# Patient Record
Sex: Female | Born: 1950
Health system: Southern US, Community
[De-identification: ages and names within clinical notes are randomized; demographics above are authoritative.]

## PROBLEM LIST (undated history)

## (undated) DIAGNOSIS — J209 Acute bronchitis, unspecified: Secondary | ICD-10-CM

## (undated) DIAGNOSIS — F419 Anxiety disorder, unspecified: Secondary | ICD-10-CM

## (undated) DIAGNOSIS — E785 Hyperlipidemia, unspecified: Secondary | ICD-10-CM

## (undated) DIAGNOSIS — F17200 Nicotine dependence, unspecified, uncomplicated: Secondary | ICD-10-CM

## (undated) DIAGNOSIS — K5900 Constipation, unspecified: Secondary | ICD-10-CM

## (undated) DIAGNOSIS — E119 Type 2 diabetes mellitus without complications: Secondary | ICD-10-CM

## (undated) DIAGNOSIS — I639 Cerebral infarction, unspecified: Secondary | ICD-10-CM

## (undated) DIAGNOSIS — G709 Myoneural disorder, unspecified: Secondary | ICD-10-CM

## (undated) DIAGNOSIS — K219 Gastro-esophageal reflux disease without esophagitis: Secondary | ICD-10-CM

## (undated) DIAGNOSIS — G894 Chronic pain syndrome: Secondary | ICD-10-CM

## (undated) DIAGNOSIS — G8929 Other chronic pain: Secondary | ICD-10-CM

## (undated) DIAGNOSIS — J45909 Unspecified asthma, uncomplicated: Secondary | ICD-10-CM

## (undated) DIAGNOSIS — I10 Essential (primary) hypertension: Secondary | ICD-10-CM

## (undated) DIAGNOSIS — F331 Major depressive disorder, recurrent, moderate: Secondary | ICD-10-CM

## (undated) DIAGNOSIS — M1990 Unspecified osteoarthritis, unspecified site: Secondary | ICD-10-CM

## (undated) DIAGNOSIS — T7840XA Allergy, unspecified, initial encounter: Secondary | ICD-10-CM

## (undated) DIAGNOSIS — R519 Headache, unspecified: Secondary | ICD-10-CM

## (undated) DIAGNOSIS — M79606 Pain in leg, unspecified: Secondary | ICD-10-CM

## (undated) DIAGNOSIS — F411 Generalized anxiety disorder: Secondary | ICD-10-CM

## (undated) DIAGNOSIS — R413 Other amnesia: Secondary | ICD-10-CM

## (undated) DIAGNOSIS — M79603 Pain in arm, unspecified: Secondary | ICD-10-CM

## (undated) DIAGNOSIS — R269 Unspecified abnormalities of gait and mobility: Secondary | ICD-10-CM

## (undated) DIAGNOSIS — Z959 Presence of cardiac and vascular implant and graft, unspecified: Secondary | ICD-10-CM

## (undated) DIAGNOSIS — R471 Dysarthria and anarthria: Secondary | ICD-10-CM

## (undated) DIAGNOSIS — R5383 Other fatigue: Secondary | ICD-10-CM

## (undated) DIAGNOSIS — E0590 Thyrotoxicosis, unspecified without thyrotoxic crisis or storm: Secondary | ICD-10-CM

## (undated) DIAGNOSIS — Z9181 History of falling: Secondary | ICD-10-CM

## (undated) DIAGNOSIS — R51 Headache: Secondary | ICD-10-CM

## (undated) HISTORY — DX: Hyperlipidemia, unspecified: E78.5

## (undated) HISTORY — DX: Major depressive disorder, recurrent, moderate: F33.1

## (undated) HISTORY — DX: Gastro-esophageal reflux disease without esophagitis: K21.9

## (undated) HISTORY — DX: Nicotine dependence, unspecified, uncomplicated: F17.200

## (undated) HISTORY — DX: Constipation, unspecified: K59.00

## (undated) HISTORY — DX: Essential (primary) hypertension: I10

## (undated) HISTORY — DX: Other amnesia: R41.3

## (undated) HISTORY — DX: Unspecified osteoarthritis, unspecified site: M19.90

## (undated) HISTORY — DX: Acute bronchitis, unspecified: J20.9

## (undated) HISTORY — DX: Unspecified abnormalities of gait and mobility: R26.9

## (undated) HISTORY — DX: Thyrotoxicosis, unspecified without thyrotoxic crisis or storm: E05.90

## (undated) HISTORY — DX: History of falling: Z91.81

## (undated) HISTORY — DX: Presence of cardiac and vascular implant and graft, unspecified: Z95.9

## (undated) HISTORY — DX: Myoneural disorder, unspecified: G70.9

## (undated) HISTORY — DX: Unspecified asthma, uncomplicated: J45.909

## (undated) HISTORY — DX: Generalized anxiety disorder: F41.1

## (undated) HISTORY — DX: Chronic pain syndrome: G89.4

## (undated) HISTORY — DX: Cerebral infarction, unspecified: I63.9

## (undated) HISTORY — DX: Anxiety disorder, unspecified: F41.9

## (undated) HISTORY — DX: Type 2 diabetes mellitus without complications: E11.9

## (undated) HISTORY — DX: Allergy, unspecified, initial encounter: T78.40XA

## (undated) HISTORY — PX: FINGER SURGERY: SHX640

## (undated) HISTORY — DX: Dysarthria and anarthria: R47.1

---

## 1998-06-05 ENCOUNTER — Encounter: Payer: Self-pay | Admitting: Emergency Medicine

## 1998-06-05 ENCOUNTER — Emergency Department (HOSPITAL_COMMUNITY): Admission: EM | Admit: 1998-06-05 | Discharge: 1998-06-05 | Payer: Self-pay | Admitting: Emergency Medicine

## 2000-09-17 ENCOUNTER — Emergency Department (HOSPITAL_COMMUNITY): Admission: EM | Admit: 2000-09-17 | Discharge: 2000-09-17 | Payer: Self-pay | Admitting: Internal Medicine

## 2002-05-07 ENCOUNTER — Emergency Department (HOSPITAL_COMMUNITY): Admission: EM | Admit: 2002-05-07 | Discharge: 2002-05-07 | Payer: Self-pay | Admitting: Emergency Medicine

## 2002-05-07 ENCOUNTER — Encounter: Payer: Self-pay | Admitting: Emergency Medicine

## 2004-01-05 ENCOUNTER — Emergency Department (HOSPITAL_COMMUNITY): Admission: EM | Admit: 2004-01-05 | Discharge: 2004-01-05 | Payer: Self-pay | Admitting: Emergency Medicine

## 2004-01-07 ENCOUNTER — Inpatient Hospital Stay (HOSPITAL_COMMUNITY): Admission: EM | Admit: 2004-01-07 | Discharge: 2004-01-09 | Payer: Self-pay | Admitting: Emergency Medicine

## 2005-11-15 ENCOUNTER — Emergency Department (HOSPITAL_COMMUNITY): Admission: EM | Admit: 2005-11-15 | Discharge: 2005-11-15 | Payer: Self-pay | Admitting: Family Medicine

## 2006-02-05 DIAGNOSIS — G8929 Other chronic pain: Secondary | ICD-10-CM

## 2006-02-05 HISTORY — DX: Other chronic pain: G89.29

## 2006-05-17 ENCOUNTER — Encounter: Payer: Self-pay | Admitting: Emergency Medicine

## 2006-05-17 ENCOUNTER — Inpatient Hospital Stay (HOSPITAL_COMMUNITY): Admission: AD | Admit: 2006-05-17 | Discharge: 2006-05-21 | Payer: Self-pay | Admitting: Neurology

## 2006-05-20 ENCOUNTER — Encounter (INDEPENDENT_AMBULATORY_CARE_PROVIDER_SITE_OTHER): Payer: Self-pay | Admitting: Cardiology

## 2006-05-20 ENCOUNTER — Encounter: Payer: Self-pay | Admitting: Cardiology

## 2006-06-03 ENCOUNTER — Encounter: Admission: RE | Admit: 2006-06-03 | Discharge: 2006-06-27 | Payer: Self-pay | Admitting: Neurology

## 2006-07-02 ENCOUNTER — Encounter: Admission: RE | Admit: 2006-07-02 | Discharge: 2006-08-01 | Payer: Self-pay | Admitting: Nurse Practitioner

## 2006-08-29 ENCOUNTER — Encounter: Admission: RE | Admit: 2006-08-29 | Discharge: 2006-11-27 | Payer: Self-pay | Admitting: Internal Medicine

## 2006-10-10 ENCOUNTER — Observation Stay (HOSPITAL_COMMUNITY): Admission: EM | Admit: 2006-10-10 | Discharge: 2006-10-11 | Payer: Self-pay | Admitting: Emergency Medicine

## 2008-02-03 ENCOUNTER — Emergency Department (HOSPITAL_COMMUNITY): Admission: EM | Admit: 2008-02-03 | Discharge: 2008-02-03 | Payer: Self-pay | Admitting: Emergency Medicine

## 2008-04-20 ENCOUNTER — Ambulatory Visit: Payer: Self-pay | Admitting: Interventional Radiology

## 2008-04-20 ENCOUNTER — Emergency Department (HOSPITAL_BASED_OUTPATIENT_CLINIC_OR_DEPARTMENT_OTHER): Admission: EM | Admit: 2008-04-20 | Discharge: 2008-04-20 | Payer: Self-pay | Admitting: Emergency Medicine

## 2009-03-16 ENCOUNTER — Emergency Department (HOSPITAL_COMMUNITY): Admission: EM | Admit: 2009-03-16 | Discharge: 2009-03-16 | Payer: Self-pay | Admitting: Emergency Medicine

## 2009-06-13 ENCOUNTER — Encounter: Admission: RE | Admit: 2009-06-13 | Discharge: 2009-06-13 | Payer: Self-pay | Admitting: Cardiology

## 2009-06-16 ENCOUNTER — Ambulatory Visit (HOSPITAL_COMMUNITY): Admission: RE | Admit: 2009-06-16 | Discharge: 2009-06-16 | Payer: Self-pay | Admitting: Cardiology

## 2009-08-19 ENCOUNTER — Emergency Department (HOSPITAL_COMMUNITY): Admission: EM | Admit: 2009-08-19 | Discharge: 2009-08-19 | Payer: Self-pay | Admitting: Emergency Medicine

## 2010-02-26 ENCOUNTER — Encounter: Payer: Self-pay | Admitting: Obstetrics and Gynecology

## 2010-04-04 ENCOUNTER — Inpatient Hospital Stay (INDEPENDENT_AMBULATORY_CARE_PROVIDER_SITE_OTHER)
Admission: RE | Admit: 2010-04-04 | Discharge: 2010-04-04 | Disposition: A | Discharge status: Home / Self Care | Payer: Medicare Other | Source: Ambulatory Visit | Attending: Family Medicine | Admitting: Family Medicine

## 2010-04-04 ENCOUNTER — Ambulatory Visit (INDEPENDENT_AMBULATORY_CARE_PROVIDER_SITE_OTHER): Payer: Medicare Other

## 2010-04-04 DIAGNOSIS — R05 Cough: Secondary | ICD-10-CM

## 2010-04-04 DIAGNOSIS — R059 Cough, unspecified: Secondary | ICD-10-CM

## 2010-05-18 LAB — DIFFERENTIAL
Basophils Absolute: 0 10*3/uL (ref 0.0–0.1)
Basophils Relative: 0 % (ref 0–1)
Eosinophils Absolute: 0.1 10*3/uL (ref 0.0–0.7)
Eosinophils Relative: 2 % (ref 0–5)
Lymphocytes Relative: 26 % (ref 12–46)
Lymphs Abs: 1.8 10*3/uL (ref 0.7–4.0)
Monocytes Absolute: 0.5 10*3/uL (ref 0.1–1.0)
Monocytes Relative: 7 % (ref 3–12)
Neutro Abs: 4.8 10*3/uL (ref 1.7–7.7)
Neutrophils Relative %: 65 % (ref 43–77)

## 2010-05-18 LAB — URINE MICROSCOPIC-ADD ON

## 2010-05-18 LAB — COMPREHENSIVE METABOLIC PANEL
ALT: 35 U/L (ref 0–35)
AST: 29 U/L (ref 0–37)
Albumin: 4.8 g/dL (ref 3.5–5.2)
Alkaline Phosphatase: 81 U/L (ref 39–117)
BUN: 12 mg/dL (ref 6–23)
CO2: 31 mEq/L (ref 19–32)
Calcium: 9.6 mg/dL (ref 8.4–10.5)
Chloride: 99 mEq/L (ref 96–112)
Creatinine, Ser: 0.8 mg/dL (ref 0.4–1.2)
GFR calc Af Amer: >60 mL/min (ref >60)
GFR calc non Af Amer: >60 mL/min (ref >60)
Glucose, Bld: 118 mg/dL — ABNORMAL HIGH (ref 70–99)
Potassium: 3.5 mEq/L (ref 3.5–5.1)
Sodium: 142 mEq/L (ref 135–145)
Total Bilirubin: 0.9 mg/dL (ref 0.3–1.2)
Total Protein: 8.4 g/dL — ABNORMAL HIGH (ref 6.0–8.3)

## 2010-05-18 LAB — CBC
HCT: 43.7 % (ref 36.0–46.0)
Hemoglobin: 14.6 g/dL (ref 12.0–15.0)
MCHC: 33.5 g/dL (ref 30.0–36.0)
MCV: 90 fL (ref 78.0–100.0)
Platelets: 382 10*3/uL (ref 150–400)
RBC: 4.85 MIL/uL (ref 3.87–5.11)
RDW: 11.5 % (ref 11.5–15.5)
WBC: 7.2 10*3/uL (ref 4.0–10.5)

## 2010-05-18 LAB — URINALYSIS, ROUTINE W REFLEX MICROSCOPIC
Bilirubin Urine: NEGATIVE
Glucose, UA: NEGATIVE mg/dL
Hgb urine dipstick: NEGATIVE
Ketones, ur: NEGATIVE mg/dL
Nitrite: NEGATIVE
Protein, ur: NEGATIVE mg/dL
Specific Gravity, Urine: 1.023 (ref 1.005–1.030)
Urobilinogen, UA: 0.2 mg/dL (ref 0.0–1.0)
pH: 6 (ref 5.0–8.0)

## 2010-05-18 LAB — CK: Total CK: 97 U/L (ref 7–177)

## 2010-05-26 ENCOUNTER — Ambulatory Visit (INDEPENDENT_AMBULATORY_CARE_PROVIDER_SITE_OTHER): Payer: Medicare Other | Admitting: Internal Medicine

## 2010-05-26 ENCOUNTER — Encounter: Payer: Self-pay | Admitting: Internal Medicine

## 2010-05-26 ENCOUNTER — Encounter: Payer: Medicare Other | Admitting: Internal Medicine

## 2010-05-26 DIAGNOSIS — I639 Cerebral infarction, unspecified: Secondary | ICD-10-CM | POA: Insufficient documentation

## 2010-05-26 DIAGNOSIS — I6350 Cerebral infarction due to unspecified occlusion or stenosis of unspecified cerebral artery: Secondary | ICD-10-CM

## 2010-05-26 DIAGNOSIS — M1990 Unspecified osteoarthritis, unspecified site: Secondary | ICD-10-CM | POA: Insufficient documentation

## 2010-05-26 DIAGNOSIS — K219 Gastro-esophageal reflux disease without esophagitis: Secondary | ICD-10-CM | POA: Insufficient documentation

## 2010-05-26 DIAGNOSIS — Z1231 Encounter for screening mammogram for malignant neoplasm of breast: Secondary | ICD-10-CM

## 2010-05-26 DIAGNOSIS — G589 Mononeuropathy, unspecified: Secondary | ICD-10-CM

## 2010-05-26 DIAGNOSIS — G629 Polyneuropathy, unspecified: Secondary | ICD-10-CM

## 2010-05-26 DIAGNOSIS — Z139 Encounter for screening, unspecified: Secondary | ICD-10-CM

## 2010-05-26 DIAGNOSIS — I10 Essential (primary) hypertension: Secondary | ICD-10-CM

## 2010-05-26 DIAGNOSIS — E785 Hyperlipidemia, unspecified: Secondary | ICD-10-CM

## 2010-05-26 LAB — CBC WITH DIFFERENTIAL/PLATELET
Basophils Absolute: 0 10*3/uL (ref 0.0–0.1)
Basophils Relative: 0 % (ref 0–1)
Eosinophils Absolute: 0.1 10*3/uL (ref 0.0–0.7)
Eosinophils Relative: 2 % (ref 0–5)
HCT: 45.2 % (ref 36.0–46.0)
Hemoglobin: 14.9 g/dL (ref 12.0–15.0)
Lymphocytes Relative: 27 % (ref 12–46)
Lymphs Abs: 2.1 10*3/uL (ref 0.7–4.0)
MCH: 30.3 pg (ref 26.0–34.0)
MCHC: 33 g/dL (ref 30.0–36.0)
MCV: 91.9 fL (ref 78.0–100.0)
Monocytes Absolute: 0.5 10*3/uL (ref 0.1–1.0)
Monocytes Relative: 7 % (ref 3–12)
Neutro Abs: 5 10*3/uL (ref 1.7–7.7)
Neutrophils Relative %: 64 % (ref 43–77)
Platelets: 382 10*3/uL (ref 150–400)
RBC: 4.92 MIL/uL (ref 3.87–5.11)
RDW: 13 % (ref 11.5–15.5)
WBC: 7.8 10*3/uL (ref 4.0–10.5)

## 2010-05-26 LAB — POCT URINALYSIS DIPSTICK
Bilirubin, UA: NEGATIVE
Blood, UA: NEGATIVE
Glucose, UA: NEGATIVE
Ketones, UA: NEGATIVE
Leukocytes, UA: NEGATIVE
Nitrite, UA: NEGATIVE
Protein, UA: NEGATIVE
Spec Grav, UA: 1.025
Urobilinogen, UA: 0.2
pH, UA: 6

## 2010-05-26 LAB — TSH: TSH: 0.598 u[IU]/mL (ref 0.350–4.500)

## 2010-05-26 MED ORDER — PREGABALIN 150 MG PO CAPS: 150.0000 mg | ORAL_CAPSULE | Freq: Two times a day (BID) | ORAL | Status: DC

## 2010-05-26 MED ORDER — ASPIRIN-DIPYRIDAMOLE ER 25-200 MG PO CP12: 1.0000 | ORAL_CAPSULE | Freq: Two times a day (BID) | ORAL | Status: DC

## 2010-05-26 MED ORDER — NAPROXEN 500 MG PO TABS: 500.0000 mg | ORAL_TABLET | Freq: Two times a day (BID) | ORAL | Status: DC

## 2010-05-26 MED ORDER — LISINOPRIL-HYDROCHLOROTHIAZIDE 20-12.5 MG PO TABS: 1.0000 | ORAL_TABLET | Freq: Every day | ORAL | Status: DC

## 2010-05-26 MED ORDER — OMEPRAZOLE 40 MG PO CPDR: 40.0000 mg | DELAYED_RELEASE_CAPSULE | Freq: Every day | ORAL | Status: DC

## 2010-05-26 MED ORDER — HYDROCODONE-ACETAMINOPHEN 7.5-500 MG PO TABS: 1.0000 | ORAL_TABLET | Freq: Four times a day (QID) | ORAL | Status: AC | PRN

## 2010-05-26 MED ORDER — LISINOPRIL-HYDROCHLOROTHIAZIDE 20-12.5 MG PO TABS: 2.0000 | ORAL_TABLET | Freq: Every day | ORAL | Status: DC

## 2010-05-26 MED ORDER — DULOXETINE HCL 60 MG PO CPEP: 60.0000 mg | ORAL_CAPSULE | Freq: Every day | ORAL | Status: DC

## 2010-05-26 MED ORDER — SIMVASTATIN 20 MG PO TABS: 20.0000 mg | ORAL_TABLET | Freq: Every day | ORAL | Status: DC

## 2010-05-26 NOTE — Assessment & Plan Note (Signed)
History of CVA in 2007, visual left-sided weakness, on Aggrenox.

## 2010-05-26 NOTE — Assessment & Plan Note (Signed)
Stable; continue current regimen.

## 2010-05-26 NOTE — Progress Notes (Signed)
  Subjective:    Patient ID: Kara Mendoza, female    DOB: 1950-03-16, 60 y.o.   MRN: [id number]  HPI  Patient is a 60 year old female with a past medical history of CVA in 2007, hypertension, hyperlipidemia, osteoarthritis, and depression. Presents to the outpatient clinic to establish care as the patient is unable to go to [facility] for routine visits, she currently resides in [place]. The patient is doing well denies any complaints, brought in all of her medications. However we have not received her records yet from her primary care physician's office. Therefore have incomplete information. Will await the records to arrive.  Review of Systems  [all other systems reviewed and are negative       Objective:   Physical Exam  [nursing notereviewed. Constitutional: She is oriented to person, place, and time. She appears well-developed and well-nourished.  HENT:  Head: Normocephalic and atraumatic.  Eyes: Pupils are equal, round, and reactive to light.  Neck: Normal range of motion. Neck supple. No JVD present. No thyromegaly present.  Cardiovascular: Normal rate, regular rhythm and normal heart sounds.   No murmur heard. Pulmonary/Chest: Effort normal and breath sounds normal. She has no wheezes. She has no rales.  Abdominal: Soft. Bowel sounds are normal.  Musculoskeletal: Normal range of motion. She exhibits no edema.  Neurological: She is alert and oriented to person, place, and time.       Left sided weakness  Skin: Skin is warm and dry.          Assessment & Plan:

## 2010-05-26 NOTE — Assessment & Plan Note (Signed)
Patient was taken lisinopril hydrochlorothiazide 20/12.5mg , her blood pressure still elevated, I have encouraged patient to take lisinopril hydrochlorothiazide 40/25 mg.

## 2010-05-26 NOTE — Assessment & Plan Note (Signed)
On simvastatin, check fasting lipids to see if patient is at goal.

## 2010-05-26 NOTE — Assessment & Plan Note (Signed)
Given the patient's left-sided weakness from her CVA she needs assistance from home Will schedule for home health referral, as the patient never had colonoscopy we'll refer her to GI for screening colonoscopy, also patient is due for mammogram we will also try to schedule this.

## 2010-05-26 NOTE — Assessment & Plan Note (Signed)
On Vicodin receives medications from the pain clinic.

## 2010-05-27 LAB — COMPLETE METABOLIC PANEL WITH GFR
ALT: 15 U/L (ref 0–35)
AST: 15 U/L (ref 0–37)
Albumin: 4.8 g/dL (ref 3.5–5.2)
Alkaline Phosphatase: 70 U/L (ref 39–117)
BUN: 12 mg/dL (ref 6–23)
CO2: 32 mEq/L (ref 19–32)
Calcium: 10.7 mg/dL — ABNORMAL HIGH (ref 8.4–10.5)
Chloride: 100 mEq/L (ref 96–112)
Creat: 0.85 mg/dL (ref 0.40–1.20)
GFR, Est African American: >60 mL/min (ref >60)
GFR, Est Non African American: >60 mL/min (ref >60)
Glucose, Bld: 90 mg/dL (ref 70–99)
Potassium: 4.8 mEq/L (ref 3.5–5.3)
Sodium: 140 mEq/L (ref 135–145)
Total Bilirubin: 0.5 mg/dL (ref 0.3–1.2)
Total Protein: 7.5 g/dL (ref 6.0–8.3)

## 2010-05-29 ENCOUNTER — Telehealth: Payer: Self-pay | Admitting: Licensed Clinical Social Worker

## 2010-05-29 NOTE — Telephone Encounter (Signed)
Discussed  HH referral with patient who is amenable to my ordering home safety eval, PT/OT and bath aide thru [facility].  It sounds like she is also needing inhome aide.  However right now we will start with skilled care and then order inhome aide after [facility] is completed.

## 2010-05-29 NOTE — Telephone Encounter (Signed)
Left message for Kara Mendoza to contact social work.

## 2010-05-31 ENCOUNTER — Ambulatory Visit (HOSPITAL_COMMUNITY)
Admission: RE | Admit: 2010-05-31 | Discharge: 2010-05-31 | Disposition: A | Discharge status: Home / Self Care | Payer: Medicare Other | Source: Ambulatory Visit | Attending: Internal Medicine | Admitting: Internal Medicine

## 2010-05-31 DIAGNOSIS — Z1231 Encounter for screening mammogram for malignant neoplasm of breast: Secondary | ICD-10-CM | POA: Insufficient documentation

## 2010-06-06 ENCOUNTER — Telehealth: Payer: Self-pay | Admitting: *Deleted

## 2010-06-06 NOTE — Telephone Encounter (Signed)
[facility] needs order for a cane and bedside commode for pt Kara Mendoza [phone] ext [phone]

## 2010-06-07 ENCOUNTER — Other Ambulatory Visit (INDEPENDENT_AMBULATORY_CARE_PROVIDER_SITE_OTHER): Payer: Medicare Other | Admitting: *Deleted

## 2010-06-07 DIAGNOSIS — J45909 Unspecified asthma, uncomplicated: Secondary | ICD-10-CM

## 2010-06-07 DIAGNOSIS — G589 Mononeuropathy, unspecified: Secondary | ICD-10-CM

## 2010-06-07 DIAGNOSIS — I10 Essential (primary) hypertension: Secondary | ICD-10-CM

## 2010-06-07 MED ORDER — ALBUTEROL SULFATE HFA 108 (90 BASE) MCG/ACT IN AERS: 2.0000 | INHALATION_SPRAY | Freq: Four times a day (QID) | RESPIRATORY_TRACT | Status: DC | PRN

## 2010-06-07 NOTE — Telephone Encounter (Signed)
Pt c/o SOB and is asking for refill on proair.  She brought it in for her OV but if was not refilled. Will you fill for her?

## 2010-06-08 ENCOUNTER — Other Ambulatory Visit: Payer: Self-pay | Admitting: Internal Medicine

## 2010-06-08 DIAGNOSIS — R928 Other abnormal and inconclusive findings on diagnostic imaging of breast: Secondary | ICD-10-CM

## 2010-06-13 ENCOUNTER — Telehealth: Payer: Self-pay | Admitting: *Deleted

## 2010-06-13 ENCOUNTER — Ambulatory Visit
Admission: RE | Admit: 2010-06-13 | Discharge: 2010-06-13 | Disposition: A | Discharge status: Home / Self Care | Payer: Medicare Other | Source: Ambulatory Visit | Attending: Obstetrics and Gynecology | Admitting: Obstetrics and Gynecology

## 2010-06-13 DIAGNOSIS — R928 Other abnormal and inconclusive findings on diagnostic imaging of breast: Secondary | ICD-10-CM

## 2010-06-13 NOTE — Telephone Encounter (Signed)
Pam, the clinic's billing specialist needs to know what the diagnosis code for the albuterol is, i see no diag to relate it to, thanks.

## 2010-06-14 ENCOUNTER — Other Ambulatory Visit: Payer: Medicare Other

## 2010-06-14 ENCOUNTER — Encounter: Payer: Self-pay | Admitting: Internal Medicine

## 2010-06-14 ENCOUNTER — Telehealth: Payer: Self-pay | Admitting: *Deleted

## 2010-06-14 DIAGNOSIS — J45998 Other asthma: Secondary | ICD-10-CM | POA: Insufficient documentation

## 2010-06-14 NOTE — Telephone Encounter (Signed)
Asthma, is the diagnosis for the use of albuterol. Thanks.

## 2010-06-14 NOTE — Telephone Encounter (Signed)
Pt requesting medication - c/o sinus drainage, coughing (unable to sleep at night), sorethroat, chest congestion.  States it has been going on x 6 months but has gotten worse recently.  She wants to know if u can call in something for her.  [facility] [address].

## 2010-06-14 NOTE — Telephone Encounter (Signed)
Pt will need to come in for an evaluation in order to see if her symptoms are of bacterial or viral etiology to be able to further manage her symptoms.

## 2010-06-15 ENCOUNTER — Ambulatory Visit: Payer: Medicare Other | Admitting: Internal Medicine

## 2010-06-15 ENCOUNTER — Encounter: Payer: Self-pay | Admitting: Internal Medicine

## 2010-06-15 ENCOUNTER — Ambulatory Visit (INDEPENDENT_AMBULATORY_CARE_PROVIDER_SITE_OTHER): Payer: Medicare Other | Admitting: Internal Medicine

## 2010-06-15 DIAGNOSIS — G629 Polyneuropathy, unspecified: Secondary | ICD-10-CM

## 2010-06-15 DIAGNOSIS — R05 Cough: Secondary | ICD-10-CM

## 2010-06-15 DIAGNOSIS — R059 Cough, unspecified: Secondary | ICD-10-CM

## 2010-06-15 DIAGNOSIS — I10 Essential (primary) hypertension: Secondary | ICD-10-CM

## 2010-06-15 DIAGNOSIS — R053 Chronic cough: Secondary | ICD-10-CM

## 2010-06-15 DIAGNOSIS — J45909 Unspecified asthma, uncomplicated: Secondary | ICD-10-CM

## 2010-06-15 MED ORDER — PREGABALIN 200 MG PO CAPS: 200.0000 mg | ORAL_CAPSULE | Freq: Two times a day (BID) | ORAL | Status: DC

## 2010-06-15 MED ORDER — ALBUTEROL SULFATE HFA 108 (90 BASE) MCG/ACT IN AERS: 2.0000 | INHALATION_SPRAY | Freq: Four times a day (QID) | RESPIRATORY_TRACT | Status: DC | PRN

## 2010-06-15 MED ORDER — LOSARTAN POTASSIUM-HCTZ 100-25 MG PO TABS: 1.0000 | ORAL_TABLET | Freq: Every day | ORAL | Status: DC

## 2010-06-15 NOTE — Telephone Encounter (Signed)
Pt was called yesterday; an appt has been scheduled for today w/Dr. Tonny Branch.

## 2010-06-15 NOTE — Progress Notes (Signed)
Subjective:    Patient ID: Kara Mendoza, female    DOB: February 16, 1950, 60 y.o.   MRN: [id number]  HPI  Kara Mendoza is a 60 year old woman who presents today complaining of a cough and chest congestion.  The cough has been persistent for almost a year.  She feels congested in her chest and just can't get it to clear.  She sometimes coughs up white or cream sputum.  The cough is worse at night when she is laying flat.  The coughing spells are sometimes so bad that she becomes short of breath after the episodes and sometimes coughs until she almost vomits.  She has tried her albuterol inhaler that sometimes helps.  She also states that she has some nasal congestion occasionally but no post nasal drip.  She denies measured fevers, chills, nausea, or vomiting  She also states that over the last 4 to 5 months her feet have been hurting her.  The pain is in the bottom of her feet and feels like pins and needles sensation with some "cold, burning feeling."  It is worse when she walks and when she has her shoes and socks on.  It is more painful when walking but does hurt at rest as well.  No pain with stretching the foot, no lesions, sores, or ulcers.  She also denies weakness, falls or loss of balance.    Review of Systems    Constitutional: Denies fever, chills, diaphoresis, appetite change and fatigue.  HEENT: Denies photophobia, eye pain, redness, hearing loss, ear pain, congestion, sore throat, rhinorrhea, sneezing, mouth sores, trouble swallowing, neck pain, neck stiffness and tinnitus.   Respiratory: Positive for cough. Denies SOB, DOE, chest tightness,  and wheezing.   Cardiovascular: Denies chest pain, palpitations and leg swelling.  Gastrointestinal: Denies nausea, vomiting, abdominal pain, diarrhea, constipation, blood in stool and abdominal distention.  Genitourinary: Denies dysuria, urgency, frequency, hematuria, flank pain and difficulty urinating.  Musculoskeletal: Denies myalgias, back pain,  joint swelling, arthralgias and gait problem.  Skin: Denies pallor, rash and wound.  Neurological: Positive for numbness in her feet as well as tingling and burning.  Denies dizziness, seizures, syncope, weakness, light-headedness, and headaches.  Hematological: Denies adenopathy. Easy bruising, personal or family bleeding history  Psychiatric/Behavioral: Denies suicidal ideation, mood changes, confusion, nervousness, sleep disturbance and agitation   Objective:   Physical Exam    Constitutional: Vital signs reviewed.  Patient is a well-developed and well-nourished woman in no acute distress and cooperative with exam. Alert and oriented x3.  Head: Normocephalic and atraumatic Ear: TM normal bilaterally Mouth: no erythema or exudates, MMM Eyes: PERRL, EOMI, conjunctivae normal, No scleral icterus.  Neck: Supple, Trachea midline normal ROM, No JVD, mass, thyromegaly, or carotid bruit present.  Cardiovascular: RRR, S1 normal, S2 normal, no MRG, pulses symmetric and intact bilaterally Pulmonary/Chest: Bilateral expiratory wheezes in the bases.  No rales, or rhonchi noted.   Abdominal: Soft. Non-tender, non-distended, bowel sounds are normal, no masses, organomegaly, or guarding present.  GU: no CVA tenderness Musculoskeletal: No joint deformities, erythema, or stiffness, ROM full and no nontender in the ankle, knee, and hips.  There is tenderness to palpation over the plantar surface of both feet.  No point tenderness to the insertion point of the plantar fascia and no pain with stretching or movement.  There is decreased sensation over the plantar surface of the feet bilaterally.   Hematology: no cervical, inginal, or axillary adenopathy.  Neurological: A&O x3, Strength is normal and  symmetric bilaterally, cranial nerve II-XII are grossly intact, no focal motor deficit, sensory intact to light touch bilaterally.  Skin: Warm, dry and intact. No rash, cyanosis, or clubbing.  Psychiatric: Normal  mood and affect. speech and behavior is normal. Judgment and thought content normal. Cognition and memory are normal.    Assessment & Plan:

## 2010-06-15 NOTE — Patient Instructions (Signed)
Stop the Lisinopril/Hydrochlorothiazide  Start Losartan/Hydrochlorothiazide 100/25 mg 1 pill daily for your blood pressure.  You can use your Albuterol 2 puffs every 6 hours if you have coughing spells.  Increase the Lyrica to 200 mg tablets take 1 tablet twice daily for the neuropathy in your feet.   Stop in the lab to have a quick blood sample for the B12 level.  If we need to follow up before your appointment I will call you.  Follow up in 4-6 weeks to see how the cough is doing.

## 2010-06-15 NOTE — Telephone Encounter (Signed)
diag and code given to pam

## 2010-06-16 DIAGNOSIS — R053 Chronic cough: Secondary | ICD-10-CM | POA: Insufficient documentation

## 2010-06-16 DIAGNOSIS — R05 Cough: Secondary | ICD-10-CM | POA: Insufficient documentation

## 2010-06-16 NOTE — Assessment & Plan Note (Signed)
BP Readings from Last 3 Encounters:  06/15/10 124/79  05/26/10 144/84   Her blood pressure today is excellent but with the change from the lisinopril to the losartan we will need to monitor it.  If she needs to add another medication after the change the next step would be to add Norvasc to her regimen.  We will see her back in 1 month to see how she is doing with the medication switch as well as her cough.  If the cough is improved we will likely need to add Lisinopril to her allergy list.

## 2010-06-16 NOTE — Assessment & Plan Note (Signed)
She likely has some component of poorly controlled reactive airway disease so we will have her increase her albuterol while we switch her lisinopril for a different medication.  If she continues to have the wheezes noted on exam the next step will be to add an inhaled corticosteroid to her medications for better long term control of her asthma.

## 2010-06-16 NOTE — Assessment & Plan Note (Signed)
She has had a chronic non-productive cough for almost a year now.  She states that she has been on lisinopril for over 3 years since her stroke as well as having been an asthmatic for many years.  She has been using her inhaler which does sometimes help her cough.  Differential diagnosis includes cough secondary to ACEI vs poorly controlled asthma.  She has a wheeze noted in the base of the lungs bilaterally and this did improve slightly with use of her home albuterol.  The coughs seem to be worse at night which would also go with asthma.  At the same time it is not completely relieved with the albuterol so we will attack this on two fronts.  We will stop her lisinopril and change her to the Losartan/HCTZ combo medication as well has have her use the albuterol more consistently to see if she gets better relief of her cough.

## 2010-06-16 NOTE — Assessment & Plan Note (Signed)
Her pain in her feet sounds to be consistent with neuropathy.  She is currently on Lyrica and has been at the same dose for 3 years.  We will attempt to increase her lyrica for better control of her neuropathy and also check a B12 level.

## 2010-06-19 LAB — METHYLMALONIC ACID, SERUM: Methylmalonic Acid, Quantitative: 102 nmol/L (ref 87–318)

## 2010-06-20 NOTE — Cardiovascular Report (Signed)
[name], [name]                ACCOUNT NO.:  [id number]   MEDICAL RECORD NO.:  [id number]          PATIENT TYPE:  OBV   LOCATION:  3706                         FACILITY:  [facility]   PHYSICIAN:  Richard A. Alanda Amass, M.D.DATE OF BIRTH:  1951/01/14   DATE OF PROCEDURE:  10/11/2006  DATE OF DISCHARGE:                            CARDIAC CATHETERIZATION   PROCEDURE:  Retrograde central aortic catheterization, selective  coronary angiography via Judkins technique, left ventricular angiogram  in RAO projection and abdominal angiogram with PA projection.   OPERATING PHYSICIAN:  Richard A. Alanda Amass, M.D.   PROCTOR:  Nicki Guadalajara, M.D.   DESCRIPTION OF PROCEDURE:  The patient was brought to the second floor  CP lab in a post absorptive state after 5 mg of Valium p.o.  premedication.  She has had a prior stroke with a mild expressive  aphasia and she was anxious about the procedure and given 1 mg of Versed  for adequate sedation  during the procedure and 1% Xylocaine was used  throughout.  The RCFA was entered with single anterior puncture using a  18 thin-wall needle and a 6-French short sidearm sheath was inserted  without difficulty.  Diagnostic coronary angiography was done with 6-  [place], 4-cm tape and preformed Cordis coronary and pigtail catheters  using guidewire exchange throughout the procedure.  LV angiogram was  done in the RAO projection at 25 mL/14 mL per second.  Pullback was  performed and showed no gradient across the aortic valve.  Pigtail  catheter was brought down above the level of the renal arteries.  An  abdominal aortic angiogram was done in midstream PA projection at 25  mL/20 mL per second because of the patient's systemic hypertension on  medical therapy.   Catheters were removed.  Side-arm sheath was flushed.  A hand injection  in the RCFA was done in the angle projection showing good sheath  positioned in the RCFA.  The groin puncture was sealed successfully  with  a 6-French Angio-Seal device.  The patient was transferred to holding  area for postoperative care in stable condition with good right lower  extremity pulses.   PRESSURES:  LV:  145/0; LVEDP 18-20 mmHg.   CA:  145/83.   There was no gradient across the aortic valve on catheter pullback.   Fluoroscopy did show 1+ calcification in the proximal LAD and circumflex  artery.  There was 1+ mitral annular calcification present.   LV angiogram in the RAO projection showed a vigorous normally  contracting ventricle with EF of greater than 60%.  No wall motion  abnormalities.  There was angiographic mitral valve prolapse present,  but no mitral regurgitation.   The main left coronary artery was normal and moderately large.   The left anterior descending artery had tortuosity in the proximal third  with several bands, but no myocardial bridging was seen.  There was 34-  40% smooth segmental narrowing in the proximal LAD between the first  septal perforator and second diagonal branch.  There was a good residual  lumen and normal flow.  The remainder of the LAD  was widely patent and  coursed through the apex of the heart.  A third diagonal branch was  given off from the mid LAD which was small, but normal.  A second  diagonal branch was normal given off from the junction of proper  proximal third and a small  DX-1 was given off before SP-1 one was  normal.   The circumflex artery was comprised of a large, bifurcating marginal  branch.  It was widely patent smooth throughout.  There was a small PAVG  branch that was normal.   The right coronary artery was a dominant vessel.  There was eccentric  20% smooth narrowing beyond the large RV branch proximal to the crux in  the mid RCA with excellent flow.  There was a bifurcating PDA and PLA  that were both normal.  There was some catheter spasm on the second  injection at the tip resolving after catheter withdrawal of the RCA.    DISCUSSION:  [name] is a 60 year old, African-American, single mother  of two with four grandchildren who is a remote smoker.  She has a  history of systemic hypertension and exogenous obesity, hyperlipidemia  without known diabetes.  She suffered what was thought to be an embolic  CVA in the left temporoparietal area on CT scan when she was  hospitalized May 17, 2006, to May 21, 2006.  During that  hospitalization, she was treated.  She was discharged on Aggrenox and  had mild right hemiparesis and expressive aphagia which was improved at  discharge.  A TEE was done and a small to moderate-sized PFO was  diagnosed at that time.  The discharge summary records that there was no  etiology to her embolic CVA, but obviously her PFO is a possible  etiology.  She is not on Coumadin now, but rather on Aggrenox per Dr.  Pearlean Brownie.  This was not recorded on her admission medications, however, and  she was started on aspirin when admitted to the hospital on October 10, 2006.  She was admitted after an episode of severe substernal chest  discomfort that radiated straight to the mid-back and interscapular  region associated with diaphoresis and dyspnea after leaving Dr. Cloyd Stagers-  Bonsu's office.  She was brought to the emergency room by EMS with this  history.  Renal function was normal and a myocardial infarction was  ruled out by serial enzymes and EKGs including negative troponins.  She  was seen by Dr. Allyson Sabal on admission and it was recommend she undergo  diagnostic catheterization.  Informed consent was obtained from the  patient and her sister to do this as outlined above.  She has a mild  residual, right upper extremity hemiparesis, mild right facial and mild  expressive aphasia.   Cardiac catheterization shows no significant coronary disease.  She has  eccentric, smooth, 20%, mid RCA and 30-40%, smooth LAD proximally as  outlined above with normal LV function.  This represents very mild   coronary disease and would recommend medical therapy.   She has not had any CNS events and I recommend she be followed closely  on antiplatelet therapy.  I recommend she also be followed by Dr. Jacinto Halim  as well as Dr. Pearlean Brownie in the event she has any recurrent symptoms on  medical therapy.  She might be a candidate to consider PFO closure if  this was felt to be the source of her recent embolic CVA.   CATHETERIZATION DIAGNOSES:  1. Chest pain etiology  not determined.  2. Gastroesophageal reflux disease.  3. Exogenous obesity.  4. Systemic hypertension with normal single renal arteries on this      study and normal abdominal aorta and iliacs.  5. Hyperlipidemia.  6. Embolic cerebrovascular accident in April 2008, with left      temporoparietal stroke, good recovery residual, expressive aphasia,      mild right upper extremity hemiparesis.  7. Essentially normal left ventricular systolic function, mitral valve      prolapse without MR and minimal coronary artery disease.  8. Hyperlipidemia.  Recommend treatment.  9. Presumed embolic cerebrovascular accident in April 2008.  10.Mild to moderate patent foramen ovale documented on transesophageal      echocardiogram in April 2008, as outlined above.      Richard A. Alanda Amass, M.D.  Electronically Signed     RAW/MEDQ  D:  10/11/2006  T:  10/11/2006  Job:  [id number]   cc:   Pramod P. Pearlean Brownie, MD  Cristy Hilts Jacinto Halim, MD  CP Lab

## 2010-06-23 NOTE — H&P (Signed)
NAMEANABIA, WEATHERWAX                ACCOUNT NO.:  [id number]   MEDICAL RECORD NO.:  [id number]          PATIENT TYPE:  INP   LOCATION:  6729                         FACILITY:  [facility]   PHYSICIAN:  Pramod P. Pearlean Brownie, MD    DATE OF BIRTH:  09-19-1950   DATE OF ADMISSION:  05/17/2006  DATE OF DISCHARGE:                              HISTORY & PHYSICAL   REFERRING PHYSICIAN:  Bethann Berkshire, MD.   REASON FOR REFERRAL:  Stroke.   HISTORY OF PRESENT ILLNESS:  Ms. Peery is a 60 year old African-American  lady who woke up this morning with speech and language difficulties from  sleep.  The patient states she was normal at midnight when she went to  sleep.  When she woke up at 5 o'clock she had some trouble with  speaking.  She tried calling her family members and could not get  through.  Finally she managed to call her son, who describes her over  the phone as having struggling to speak and not being able to be  understood.  The patient was found to be quite emotional and tearful and  clearly having trouble speaking since then.  She has been able to  understand well.  There was no focal extremity weakness or facial  weakness described.  She was seen in the emergency room at [facility], where the suspicion for stroke was strong and hence we were  consulted.  She has already had a CT scan of the head, which was  unremarkable.  An MRI scan of the brain was subsequently done, which  showed left posterior frontal middle cerebral artery branch infarct.  The patient is transferred to the stroke unit at [facility] for  further workup.   PAST MEDICAL HISTORY:  1. Hypertension.  2. Chronic tobacco abuse.  3. Nasal  allergies.  4. Left hand cellulitis.   HOME MEDICATIONS:  Singulair and occasional aspirin.   MEDICATION ALLERGIES:  PENICILLIN.   FAMILY HISTORY:  Positive for stroke in grandfather and grandmother.   SOCIAL HISTORY:  The patient lives alone.  She works as a  Conservation officer, nature at  [employer]  She smokes two packs per day for 30 years.  She drinks alcohol  occasionally.   REVIEW OF SYSTEMS:  Negative for any chest pain, shortness of breath,  nausea, vomiting, vision problems or numbness.   PHYSICAL EXAMINATION:  GENERAL:  An obese middle-aged African-American  lady who is not in distress.  VITAL SIGNS:  She is afebrile, temperature 98.2, pulse rate 72 per  minute and regular, blood pressure 161/106, respiratory 20 per minute,  saturation is 100% room air.  Distal pulses well felt.  HEENT:  The head is nontraumatic.  ENT exam unremarkable.  NECK:  Supple without bruit.  NEUROLOGIC:  The patient is pleasant, awake, alert, cooperative.  She is  quite emotionally upset.  She is tearful.  She has profound expressive  aphasia.  When she slows down, she can speak short sentences and words.  She clearly had hesitant and nonfluent speech.  She is able to repeat  quite well  but she is unable to name.  She has some occasional  paraphasic errors.  She has very good comprehension.  She is unable to  write.  She is able to read.  Eye movements are full-range without  nystagmus.  Visual acuity and fields seem adequate.  There is minimum  right nasolabial fold asymmetry.  Palatal movements are normal.  Tongue  is midline.  Motor system exam reveals no upper extremity drift;  however, fine finger movements are diminished in the right hand.  There  is mild weakness of right grip.  She orbits the left over the right  upper extremity.  Lower extremity strength, tone and reflexes are  symmetric.  Sensation is intact.  Finger-to-nose coordination is  accurate to my exam but was impaired as per the resident.  Plantars are  downgoing.  Gait was not tested.   DATA REVIEWED:  Noncontrast CT scan of the head reveals no acute  abnormality.  MRI scan of the brain done today reveals a small left  posterior frontal MCA branch infarct.  MRA of the brain reveals slight  decrease of  peripheral branches in the left middle cerebral artery  distribution but no large vessel stenosis is seen.  A urine drug screen  is negative.  Liver enzymes, WBC count and coagulation labs and  electrolytes are all normal.   IMPRESSION:  A 60-year lady with left middle cerebral artery branch  infarction likely of embolic etiology.  Vascular risk factors of  obesity, smoking and hypertension.  She has presented beyond 6 hours of  onset of her symptoms, hence would not qualify for aggressive  intervention.  Her NIH Stroke Scale is only 4 and she will not fit into  the __________  stroke studies as well.   We will admit her to the stroke unit, telemetry monitoring and 2-D echo  for cardiac source of embolism, check carotid ultrasound and Doppler  studies, fasting lipid profile, hemoglobin A1c and homocysteine.  Aspirin for stroke prevention for now.  Smoking cessation counseling,  nicotine patch.  I had a long discussion with the patient and multiple  family members, discussed plan for evaluation and treatment, answered  questions.           ______________________________  Sunny Schlein. Pearlean Brownie, MD     PPS/MEDQ  D:  05/17/2006  T:  05/18/2006  Job:  [id number]

## 2010-06-23 NOTE — Op Note (Signed)
Kara Mendoza, Kara Mendoza                ACCOUNT NO.:  [id number]   MEDICAL RECORD NO.:  [id number]          PATIENT TYPE:  INP   LOCATION:  0442                         FACILITY:  [facility]   PHYSICIAN:  Katy Fitch. Sypher Jr., M.D.DATE OF BIRTH:  05-12-1950   DATE OF PROCEDURE:  01/07/2004  DATE OF DISCHARGE:                                 OPERATIVE REPORT   PREOPERATIVE DIAGNOSIS:  Chronic abscess, left long finger, proximal  phalangeal segment with cellulitis extending over dorsum of hand and  forearm.   POSTOPERATIVE DIAGNOSIS:  Chronic abscess, left long finger, proximal  phalangeal segment with cellulitis extending over dorsum of hand and  forearm.   OPERATION:  Incision and drainage of left long finger abscess and initiation  of intravenous vancomycin therapy.   INDICATIONS:  Kara Mendoza is a 60 year old woman, employed by [employer]  Over  the past weekend, she developed an itchy area on the dorsal aspect of her  left long finger.  She subsequently noted an area of rubor and swelling and  pain.  She was seen in the emergency room on January 05, 2004, at which  time she was noted to have an abscess forming.   The emergency room staff member who saw her attempted to incise and drain  this abscess with a needle and obtain cultures.  She was placed on  doxycycline 100 mg p.o. b.i.d.  She was advised to return for a follow-up  evaluation at this time.   Clinical examination on January 07, 2004, revealed evidence of a significant  abscess that was pointing dorsally.  She had 3+ cellulitis of her finger and  cellulitis and edema extending over the dorsum of the hand and distal  forearm.   Her vital signs in the emergency room revealed blood pressure 155/88.  Her  weight was 179.2 pounds.  Her pulse was 92, and her temperature was 99.9  oral.   A CBC was obtained, which revealed a borderline white count at 9700.  Her  absolute granulocyte count was 6.8 thousand with a neutrophil  percentage of  71%.   An urgent hand surgery consult was requested for incision and drainage of  her abscess followed by admission to the hospital for IV antibiotic therapy.   After informed consent, she was brought to the operating room at this time,  anticipating incision and drainage of her left long finger.   PROCEDURE:  Kara Mendoza is brought to the operating room and placed in  supine position upon the operating table.  Following induction of general  orotracheal anesthesia under the supervision of Dr. Shireen Quan, the left arm  was prepped with DuraPrep and draped with impervious arthroscopy drapes.   The arm was elevated for 1 minute, and arterial tourniquet on the proximal  brachium inflated to 250 mmHg.   The procedure commenced with an incision into the abscess at the point of  maximum fluctuance.  Frank pus was recovered.   Necrotic subcutaneous fat was encountered, which was debrided by spreading  with a blunt instrument followed by use of a rongeur to debride the necrotic  fat.   The extensor tendon did not appear to be violated.   The abscess was undermined to its limits and subsequently irrigated with  sterile saline until all effluent was clear.   There was no apparent involvement of the bone or joint structures.   The wound was then packed with Xeroflo and subsequently dressed with sterile  gauze, sterile Kerlix, and a Coban dressing.  There were no apparent  complications.   Kara Mendoza vital signs remained stable throughout anesthesia, although she  was noted to be baseline hypertensive due to discontinuing her blood  pressure medications recently.  She was given 1 g of vancomycin as a  therapeutic antibiotic and will be started on the vancomycin protocol.   She was awakened from anesthesia and transferred to the recovery room with  stable signs.      Robe   RVS/MEDQ  D:  01/07/2004  T:  01/09/2004  Job:  [id number]

## 2010-06-23 NOTE — Discharge Summary (Signed)
Kara Mendoza, Kara Mendoza                ACCOUNT NO.:  [id number]   MEDICAL RECORD NO.:  [id number]          PATIENT TYPE:  INP   LOCATION:  6729                         FACILITY:  [facility]   PHYSICIAN:  Pramod P. Pearlean Brownie, MD    DATE OF BIRTH:  [birth year]   DATE OF ADMISSION:  05/17/2006  DATE OF DISCHARGE:  05/21/2006                               DISCHARGE SUMMARY   DIAGNOSES AT TIME OF DISCHARGE:  1. Left temporoparietal infarction with expressive aphasia, likely      embolic.  2. Patent foramen ovale, new diagnosis.  3. Hypertension.  4. Hyperlipidemia.  5. Cigarette smoker.  6. Urinary tract infection, Proteus mirabilis.  7. Allergic rhinitis.  8. History of left hand cellulitis.   MEDICINES AT TIME OF DISCHARGE:  1. Aspirin 81 mg p.o. q.a.m. x2 weeks then discontinue.  2. Aggrenox one p.o. daily at bedtime x2 weeks then increase to b.i.d.  3. Tylenol 650 mg 1 hour before Aggrenox dose x1 week then      discontinue.  4. Cipro 500 mg b.i.d. May 20, 2006 through May 22, 2006 for a      total of 3 days.  5. Hydrochlorothiazide 25 mg a day.  6. Zocor 10 mg a day.  7. Nicotine patch 14 mg daily x2 weeks then decrease to 7 mg daily x2      weeks then discontinue.   STUDIES PERFORMED:  1. CT of the brain on admission showed mild sinus disease without      acute abnormality.  2. MRI of the brain shows acute/subacute left temporoparietal      infarction.  Additional area of restricted diffusion extending from      the left primary cortex.  Scattered subcortical T2 hyperintensities      bilaterally.  These are slightly greater than expected for her age.  3. MRA of the brain showed normal MRA circle of Willis.  4. Carotid Doppler shows no ICA stenosis.  5. 2-D echocardiogram shows EF of 60% with no left ventricular      regional wall motion abnormalities.  No embolic source.  6. TEE performed by Dr. Elease Hashimoto shows a positive bubble study for small      to moderate-sized PFO, otherwise  normal.  7. EKG shows normal sinus rhythm.  8. Lower extremity venous Dopplers; results are pending at time of      discharge.   LABORATORY STUDIES:  CBC with hemoglobin 15.1, hematocrit 43, white  blood cells 11.4, platelets 352.  Differential with neutrophils 83;  otherwise normal.  Chemistry with BUN 5, creatinine 0.79; otherwise  normal.  Coagulation studies normal.  Liver function tests normal.  Albumin normal.  Cardiac enzymes negative.  Cholesterol 170,  triglycerides 125, HDL 37 and LDL 108.  Urinalysis showed 3-6 white  blood cells, a few squamous epithelial and negative protein, small  leukocyte esterase.  Homocystine 7.4.  Alcohol less than 5.  Hemoglobin  A1c 5.7 and urine pregnancy negative.   HISTORY OF PRESENT ILLNESS:  Kara Mendoza is a 60 year old African-  American female who awoke the morning of  admission with speech and  language difficulties.  She was in her normal state of health at  midnight when she went to bed.  She awoke at 5:00 a.m. and had some  trouble with speaking.  She tried calling her family members but could  not get through.  She finally managed to call her son, who describes her  over the phone as having struggling to speak and not being able to be  understood.  The patient was found to be quite emotional and tearful and  clearly having trouble speaking since that time.  She has been able to  understand without difficulty.  There was no focal extremity weakness or  facial weakness.  She was seen in the emergency room at [facility] where suspicion for stroke was strong, and hence neurology was  consulted.  CT of the head was negative.  MRI showed a left posterior  frontal middle cerebral artery branch infarct.  She was not a TPA  candidate secondary to time.  She was transferred to [facility]  for further stroke evaluation.   It was felt her stroke was likely embolic in nature though no embolic  source was found including  performing a transesophageal echocardiogram  which showed a patent foramen ovale.  She does have vascular risk  factors of hypertension, obesity, smoking dyslipidemia.  She was seen by  PT, OT and speech and felt she would best benefit from outpatient follow-  up.  She is able to return home with her son who will provide her care.  Of note, in the hospital she had a urinalysis which was positive for  Proteus and was started on Cipro for a total of a 3-day course.  Lipids  were elevated, and was also started on a new statin.  She has been  advised to stop smoking.  She has been placed on Aggrenox for secondary  stroke prevention.  Plans were to do a left lower extremity venous  Doppler prior to discharge and then follow-up in 2 weeks at Dr. Marlis Edelson  office for an outpatient bubble and emboli monitoring study.   CONDITION ON DISCHARGE:  The patient alert and oriented x3.  She has  significant expressive aphasia.  She can speak short sentences and name  objects.  She has difficulty with spontaneous speech and getting across  her ideas; she is hesitant.  Eye movements are full.  Her right face is  asymmetric.  She has mild right upper extremity drift and decreased fine  motor movement on the right.  She orbits her left over the right arm.   DISCHARGE/PLAN:  1. Discharge home with family.  2. Outpatient OT, PT and speech therapy.  3. Aggrenox for secondary stroke prevention.  4. New statin; needs follow-up in 4-6 weeks.  Goal LDL less than 100.  5. Obtain a primary care physician to follow up vascular risk factors.  6. Follow up with Dr. Pearlean Brownie in 2 weeks for outpatient bubble study and      emboli monitoring.      Annie Main, N.P.    ______________________________  Sunny Schlein. Pearlean Brownie, MD    SB/MEDQ  D:  05/21/2006  T:  05/21/2006  Job:  [id number]   cc:   Jackie Plum, M.D.

## 2010-07-11 ENCOUNTER — Ambulatory Visit (INDEPENDENT_AMBULATORY_CARE_PROVIDER_SITE_OTHER): Payer: Medicare Other | Admitting: Internal Medicine

## 2010-07-11 VITALS — BP 131/78 | HR 94 | Temp 97.9°F | Ht 62.0 in | Wt 185.1 lb

## 2010-07-11 DIAGNOSIS — K219 Gastro-esophageal reflux disease without esophagitis: Secondary | ICD-10-CM

## 2010-07-11 DIAGNOSIS — J45909 Unspecified asthma, uncomplicated: Secondary | ICD-10-CM

## 2010-07-11 DIAGNOSIS — I10 Essential (primary) hypertension: Secondary | ICD-10-CM

## 2010-07-11 DIAGNOSIS — J45998 Other asthma: Secondary | ICD-10-CM

## 2010-07-11 DIAGNOSIS — R053 Chronic cough: Secondary | ICD-10-CM

## 2010-07-11 DIAGNOSIS — J309 Allergic rhinitis, unspecified: Secondary | ICD-10-CM

## 2010-07-11 DIAGNOSIS — R05 Cough: Secondary | ICD-10-CM

## 2010-07-11 DIAGNOSIS — R059 Cough, unspecified: Secondary | ICD-10-CM

## 2010-07-11 MED ORDER — FLUTICASONE PROPIONATE 50 MCG/ACT NA SUSP: 1.0000 | Freq: Every day | NASAL | Status: DC

## 2010-07-11 MED ORDER — FLUTICASONE-SALMETEROL 500-50 MCG/DOSE IN AEPB: 1.0000 | INHALATION_SPRAY | Freq: Two times a day (BID) | RESPIRATORY_TRACT | Status: DC

## 2010-07-11 NOTE — Patient Instructions (Signed)
Return in one month Start taking new inhaler twice daily Start squirting your nose with new medicine twice daily Follow up with allergist when appointment available.

## 2010-07-11 NOTE — Assessment & Plan Note (Signed)
I will start her on flonase and see how she responds.

## 2010-07-11 NOTE — Assessment & Plan Note (Signed)
BP well controlled. Advised throwing away lisinopril bottle and continue losartan with hctz.

## 2010-07-11 NOTE — Assessment & Plan Note (Signed)
Her asthma is extensive, under diagnosed and treated based on her physical exam and history. I will aggressively treat it with high dose of advair, and if she is much better later on, she could be dose reduced. I will refer her to allergist for allergy testing and decide how much of a role dog she has is playing in her symptoms.

## 2010-07-11 NOTE — Progress Notes (Signed)
Subjective:    Patient ID: Kara Mendoza, female    DOB: 04-07-1950, 60 y.o.   MRN: [id number]  URI  Associated symptoms include coughing.  Cough  Sore Throat  Associated symptoms include coughing.  Sinusitis Associated symptoms include coughing.    [name] is a 60 year old woman who presents today complaining of a cough and chest congestion.  The cough has been persistent for almost a year.  She feels congested in her chest and just can't get it to clear.  She sometimes coughs up white or cream sputum.  The cough is worse at night when she is laying flat.  The coughing spells are sometimes so bad that she becomes short of breath after the episodes and sometimes coughs until she almost vomits.  She has tried her albuterol inhaler that sometimes helps.  She also states that she has some nasal congestion occasionally but no post nasal drip.  She denies measured fevers, chills, nausea, or vomiting.  She was changed to losartan from lisinopril last visit and it did not help. However, she is carrying both of the bottles to the clinic so I am not sure if she truly stopped taking lisinopril. She also says that some of the symptoms are postural and could be related to GERD/hiatus hernia. She also reports symptoms when she experiences change in temperature.    Review of Systems  Respiratory: Positive for cough.       Constitutional: Denies fever, chills, diaphoresis, appetite change and fatigue.  HEENT: Denies photophobia, eye pain, redness, hearing loss, ear pain, congestion, sore throat, rhinorrhea, sneezing, mouth sores, trouble swallowing, neck pain, neck stiffness and tinnitus.   Respiratory: Positive for cough. Denies SOB, DOE, chest tightness,  and wheezing.   Cardiovascular: Denies chest pain, palpitations and leg swelling.  Gastrointestinal: Denies nausea, vomiting, abdominal pain, diarrhea, constipation, blood in stool and abdominal distention.  Genitourinary: Denies dysuria, urgency,  frequency, hematuria, flank pain and difficulty urinating.  Musculoskeletal: Denies myalgias, back pain, joint swelling, arthralgias and gait problem.  Skin: Denies pallor, rash and wound.  Neurological: Positive for numbness in her feet as well as tingling and burning.  Denies dizziness, seizures, syncope, weakness, light-headedness, and headaches.  Hematological: Denies adenopathy. Easy bruising, personal or family bleeding history  Psychiatric/Behavioral: Denies suicidal ideation, mood changes, confusion, nervousness, sleep disturbance and agitation   Objective:   Physical Exam     Constitutional: Vital signs reviewed.  Patient is a well-developed and well-nourished woman in no acute distress and cooperative with exam. Alert and oriented x3.  Head: Normocephalic and atraumatic Ear: TM normal bilaterally Mouth: no erythema or exudates, MMM Eyes: PERRL, EOMI, conjunctivae normal, No scleral icterus.  Neck: Supple, Trachea midline normal ROM, No JVD, mass, thyromegaly, or carotid bruit present.  Cardiovascular: RRR, S1 normal, S2 normal, no MRG, pulses symmetric and intact bilaterally Pulmonary/Chest: Bilateral expiratory wheezes all over the lung.  No rales, or rhonchi noted.   Abdominal: Soft. Non-tender, non-distended, bowel sounds are normal, no masses, organomegaly, or guarding present.  GU: no CVA tenderness Musculoskeletal: No joint deformities, erythema, or stiffness, ROM full and no nontender in the ankle, knee, and hips.  There is tenderness to palpation over the plantar surface of both feet.  No point tenderness to the insertion point of the plantar fascia and no pain with stretching or movement.  There is decreased sensation over the plantar surface of the feet bilaterally.   Hematology: no cervical, inginal, or axillary adenopathy.  Neurological: A&O  x3, Strength is normal and symmetric bilaterally, cranial nerve II-XII are grossly intact, no focal motor deficit, sensory intact  to light touch bilaterally.  Skin: Warm, dry and intact. No rash, cyanosis, or clubbing.  Psychiatric: Normal mood and affect. speech and behavior is normal. Judgment and thought content normal. Cognition and memory are normal.    Assessment & Plan:

## 2010-07-11 NOTE — Assessment & Plan Note (Signed)
   Continue omeprazole as prescribed. 

## 2010-08-21 NOTE — Progress Notes (Signed)
Addended by: Neomia Dear on: 08/21/2010 02:25 PM   Modules accepted: Orders

## 2010-08-30 ENCOUNTER — Other Ambulatory Visit: Payer: Self-pay | Admitting: Internal Medicine

## 2010-08-30 NOTE — Telephone Encounter (Signed)
I would have her come in to see someone in the next couple of days. She should not be on both of these anti-depressants-especially from 2 different physicians. Thank you.

## 2010-08-30 NOTE — Telephone Encounter (Signed)
Pt states Dr. Venida Jarvis prescribed the Paxil. And states her memory is not very good since the stroke, so she is confused about her her medication.  But was able to tell me,from the bottles, she is taking both Paxil and Cymbalta.  I will call her back in the morning to help her schedule an appt w/her PCP to discuss her medications.  She states she thinks her medications are not helping her memory either.  Her is not working at the moment.

## 2010-08-30 NOTE — Telephone Encounter (Signed)
Please find out who prescribed the paroxetine, and whether the patient has been taking both Cymbalta and paroxetine.

## 2010-08-31 NOTE — Telephone Encounter (Signed)
Pt has an appt 7/27 with Dr. Anselm Jungling.

## 2010-09-01 ENCOUNTER — Ambulatory Visit (INDEPENDENT_AMBULATORY_CARE_PROVIDER_SITE_OTHER): Payer: Medicare Other | Admitting: Internal Medicine

## 2010-09-01 ENCOUNTER — Encounter: Payer: Self-pay | Admitting: Internal Medicine

## 2010-09-01 DIAGNOSIS — R519 Headache, unspecified: Secondary | ICD-10-CM | POA: Insufficient documentation

## 2010-09-01 DIAGNOSIS — G629 Polyneuropathy, unspecified: Secondary | ICD-10-CM

## 2010-09-01 DIAGNOSIS — M722 Plantar fascial fibromatosis: Secondary | ICD-10-CM

## 2010-09-01 DIAGNOSIS — J45909 Unspecified asthma, uncomplicated: Secondary | ICD-10-CM

## 2010-09-01 DIAGNOSIS — R51 Headache: Secondary | ICD-10-CM | POA: Insufficient documentation

## 2010-09-01 DIAGNOSIS — G589 Mononeuropathy, unspecified: Secondary | ICD-10-CM

## 2010-09-01 MED ORDER — ALBUTEROL SULFATE HFA 108 (90 BASE) MCG/ACT IN AERS: 2.0000 | INHALATION_SPRAY | Freq: Four times a day (QID) | RESPIRATORY_TRACT | Status: DC | PRN

## 2010-09-01 MED ORDER — DULOXETINE HCL 60 MG PO CPEP: 60.0000 mg | ORAL_CAPSULE | Freq: Every day | ORAL | Status: DC

## 2010-09-01 MED ORDER — NITROGLYCERIN 0.4 MG SL SUBL: 0.4000 mg | SUBLINGUAL_TABLET | SUBLINGUAL | Status: DC | PRN

## 2010-09-01 NOTE — Patient Instructions (Signed)
You can apply ice to the bottom of your foot for pain relief, wear support supportive shoe or sandal when you step out of bed or walking around I will refer you for shoe fitting You can take Ibuprofen 1 tablet every 4-6 hours for your heel pain Follow up with your primary care doctor in 1-2 months

## 2010-09-01 NOTE — Progress Notes (Signed)
HPI: 60 yo woman with PMH of stroke presents today for medication refills and right heel pain.    1. She states that she has heel pain in the past one month which is worse when first step out of bed in the morning.  She reports burning sensation on the bottom of her right foot.  She also has numbness and tingling on her left foot as well which is a result of her stroke in 2007.  She only wants conservative treatment and does not want any kind of injections because fear of needles.  2. Patient stated that she change her primary care physician however her former primary care physician  still send in her refills therefore she does not know which medication she is supposed to take. Upon reviewing of her medication bottles, she is taking the same medication as we have on our records except for Paxil.  She states that Cymbalta works better than Paxil therefore we discarded her Paxil into the sharp needle containing her while she was in the office so that she would not be confused.  She also wants refills on all of her medications.  3. She has been going to the pain clinic for her headache s/p stroke.  Today she is requesting for our clinic to manage her pain meds so that she does not have to go to multiple appointments since she does have a problem with transportation.  She is currently taking hydrocodone 10 mg/325 mg 4 times daily.  I call a pain clinic, Dr. Loren Racer, to confirm that that was okay for her to receive her pain medication prescription from her PCP, however, his office staff stated that it is not allow and that she has to withdrawal from the pain clinic formally. I also stated that we need to verify with her PCP prior to starting a pain contract for her.   ROS: as per HPI  PE: General: alert, well-developed, and cooperative to examination. .  Lungs: normal respiratory effort, no accessory muscle use, normal breath sounds, no crackles, and no wheezes. Heart: normal rate, regular rhythm, no  murmur, no gallop, and no rub.  Abdomen: soft, non-tender, normal bowel sounds, no distention, no guarding, no rebound tenderness  Msk: no joint swelling, no joint warmth, and no redness over joints.  Pulses: 2+ DP/PT pulses bilaterally Extremities: No cyanosis, clubbing, edema. Negative straight leg raising, tenderness on plantar aspect of right foot.  Limited ROM on left knee Neurologic: alert & oriented X3, cranial nerves II-XII intact, strength normal in all extremities, sensation intact to light touch, dysarthria Skin: turgor normal and no rashes.  Psych: Oriented X3, memory intact for recent and remote, normally interactive, good eye contact, not anxious appearing, and not depressed appearing.

## 2010-09-01 NOTE — Assessment & Plan Note (Signed)
Patient has constant headache since 05/2006 from her stroke.  She reports requiring chronic narcotics.  Currently seeing Dr. Wyline Beady for pain meds but would like to get her pain med prescription from PCP so she does not have to go to many appointments. Please see history of present illness problem number 3 for more detail.  I will defer to her PCP in regards to a pain contract if she decided to withdraw from her current pain clinic. She has an appointment with her pain clinic on 09/08/2010. -She is currently receiving Hydrocodone 10-325 mg QID #120 from Dr. Nilsa Nutting -No prescription given today

## 2010-09-01 NOTE — Assessment & Plan Note (Signed)
Clinical symptoms consistent with plantar fasciitis from one month in duration.  Explain several options to patient however she wants conservative therapy at this time and denies any there were injection because fear of needles.   -Patient was instructed to take NSAIDs such as ibuprofen every 6 hour when necessary pain -She is supposed to wear supportive shoes or sandals.  I will refer her to [facility] for appropriate orthotics. A prescription was given to patient -She can also apply ice to the bottom of her foot for relief

## 2010-11-02 ENCOUNTER — Telehealth: Payer: Self-pay | Admitting: *Deleted

## 2010-11-02 NOTE — Telephone Encounter (Signed)
States she has been passing out and falling onto the floor, she states she is weak and stressed due to her apt having a bed bug infestation, she is distraught. appt is made for 9/28 at 1415.

## 2010-11-03 ENCOUNTER — Encounter: Payer: Medicare Other | Admitting: Internal Medicine

## 2010-11-03 ENCOUNTER — Ambulatory Visit (INDEPENDENT_AMBULATORY_CARE_PROVIDER_SITE_OTHER): Payer: Medicare Other | Admitting: Internal Medicine

## 2010-11-03 ENCOUNTER — Encounter: Payer: Self-pay | Admitting: Internal Medicine

## 2010-11-03 VITALS — BP 94/59 | HR 104 | Temp 98.4°F | Wt 176.2 lb

## 2010-11-03 DIAGNOSIS — K219 Gastro-esophageal reflux disease without esophagitis: Secondary | ICD-10-CM

## 2010-11-03 DIAGNOSIS — Z0000 Encounter for general adult medical examination without abnormal findings: Secondary | ICD-10-CM

## 2010-11-03 DIAGNOSIS — R5381 Other malaise: Secondary | ICD-10-CM

## 2010-11-03 DIAGNOSIS — R531 Weakness: Secondary | ICD-10-CM | POA: Insufficient documentation

## 2010-11-03 DIAGNOSIS — E785 Hyperlipidemia, unspecified: Secondary | ICD-10-CM

## 2010-11-03 LAB — COMPREHENSIVE METABOLIC PANEL
ALT: 14 U/L (ref 0–35)
AST: 17 U/L (ref 0–37)
Albumin: 4.5 g/dL (ref 3.5–5.2)
Alkaline Phosphatase: 71 U/L (ref 39–117)
BUN: 13 mg/dL (ref 6–23)
CO2: 28 mEq/L (ref 19–32)
Calcium: 10.9 mg/dL — ABNORMAL HIGH (ref 8.4–10.5)
Chloride: 99 mEq/L (ref 96–112)
Creat: 0.91 mg/dL (ref 0.50–1.10)
Glucose, Bld: 94 mg/dL (ref 70–99)
Potassium: 4.7 mEq/L (ref 3.5–5.3)
Sodium: 137 mEq/L (ref 135–145)
Total Bilirubin: 0.7 mg/dL (ref 0.3–1.2)
Total Protein: 7.6 g/dL (ref 6.0–8.3)

## 2010-11-03 LAB — LIPID PANEL
Cholesterol: 168 mg/dL (ref 0–200)
HDL: 40 mg/dL (ref >39)
LDL Cholesterol: 104 mg/dL — ABNORMAL HIGH (ref 0–99)
Total CHOL/HDL Ratio: 4.2 Ratio
Triglycerides: 118 mg/dL (ref <150)
VLDL: 24 mg/dL (ref 0–40)

## 2010-11-03 MED ORDER — OMEPRAZOLE 40 MG PO CPDR: 40.0000 mg | DELAYED_RELEASE_CAPSULE | Freq: Every day | ORAL | Status: DC

## 2010-11-03 NOTE — Patient Instructions (Signed)
Please stop taking lisinopril-hctz.

## 2010-11-03 NOTE — Progress Notes (Signed)
  Subjective:    Patient ID: Kara Mendoza, female    DOB: Mar 11, 1950, 60 y.o.   MRN: [id number]  HPI  Patient is a 60 year old female with a past medical history of CVA in 2007, hypertension, hyperlipidemia, osteoarthritis, and depression. Presents to the outpatient clinic with a chief complaint of dizziness, weakness, fatigue and near syncope for the past several days, patient denies any changes in her life denies any medication changes, denies any fever, chills, chest pain, shortness of breath or any other complaints. On medication review the patient was on lisinopril-hydrochlorothiazide in the past for blood pressure however due to her chronic cough this was switched to losartan-hydrochlorothiazide, unfortunately the patient has misunderstood this and is taking both medications concomitantly, her orthostatic vital signs are negative and her blood pressure today is 90/60, this alone seems responsible for her presenting symptoms.  Review of Systems  All other systems reviewed and are negative.       Objective:   Physical Exam  Vitals reviewed. Constitutional: She is oriented to person, place, and time. She appears well-developed and well-nourished.  HENT:  Head: Normocephalic and atraumatic.  Eyes: Pupils are equal, round, and reactive to light.  Neck: Normal range of motion. Neck supple. No JVD present. No thyromegaly present.  Cardiovascular: Normal rate, regular rhythm and normal heart sounds.   No murmur heard. Pulmonary/Chest: Effort normal and breath sounds normal. She has no wheezes. She has no rales.  Abdominal: Soft. Bowel sounds are normal.  Musculoskeletal: Normal range of motion. She exhibits no edema.  Neurological: She is alert and oriented to person, place, and time.       Residual left-sided weakness from prior stroke in 2007.  Skin: Skin is warm and dry.          Assessment & Plan:

## 2010-11-03 NOTE — Assessment & Plan Note (Signed)
Patients dizziness, weakness, fatigue and near syncope for the past several days is likely related to patient taking both lisinopril-hydrochlorothiazide and losartan-hydrochlorothiazide at the same time, as she was switched to losartan due to cough with lisinopril, however the patient continued to take both medications by mistake. Her orthostatic vital signs are negative and her blood pressure today is 90/60. Check a basic metabolic panel and liver function tests to evaluate for signs of end organ damage, discontinue her lisinopril, and reevaluate in about a month.

## 2010-11-10 LAB — DIFFERENTIAL
Band Neutrophils: 0 % (ref 0–10)
Basophils Absolute: 0 10*3/uL (ref 0.0–0.1)
Basophils Relative: 0 % (ref 0–1)
Blasts: 0 %
Eosinophils Absolute: 0 10*3/uL (ref 0.0–0.7)
Eosinophils Relative: 0 % (ref 0–5)
Lymphocytes Relative: 27 % (ref 12–46)
Lymphs Abs: 3.2 10*3/uL (ref 0.7–4.0)
Metamyelocytes Relative: 0 %
Monocytes Absolute: 0.8 10*3/uL (ref 0.1–1.0)
Monocytes Relative: 7 % (ref 3–12)
Myelocytes: 0 %
Neutro Abs: 7.8 10*3/uL — ABNORMAL HIGH (ref 1.7–7.7)
Neutrophils Relative %: 66 % (ref 43–77)
Promyelocytes Absolute: 0 %
nRBC: 0 /100 WBC

## 2010-11-10 LAB — POCT I-STAT, CHEM 8
BUN: 14 mg/dL (ref 6–23)
Calcium, Ion: 1.17 mmol/L (ref 1.12–1.32)
Chloride: 104 mEq/L (ref 96–112)
Creatinine, Ser: 0.8 mg/dL (ref 0.4–1.2)
Glucose, Bld: 104 mg/dL — ABNORMAL HIGH (ref 70–99)
HCT: 46 % (ref 36.0–46.0)
Hemoglobin: 15.6 g/dL — ABNORMAL HIGH (ref 12.0–15.0)
Potassium: 5.8 mEq/L — ABNORMAL HIGH (ref 3.5–5.1)
Sodium: 140 mEq/L (ref 135–145)
TCO2: 28 mmol/L (ref 0–100)

## 2010-11-10 LAB — URINALYSIS, ROUTINE W REFLEX MICROSCOPIC
Bilirubin Urine: NEGATIVE
Glucose, UA: NEGATIVE mg/dL
Hgb urine dipstick: NEGATIVE
Ketones, ur: NEGATIVE mg/dL
Nitrite: NEGATIVE
Protein, ur: NEGATIVE mg/dL
Specific Gravity, Urine: 1.016 (ref 1.005–1.030)
Urobilinogen, UA: 0.2 mg/dL (ref 0.0–1.0)
pH: 7 (ref 5.0–8.0)

## 2010-11-10 LAB — URINE MICROSCOPIC-ADD ON

## 2010-11-10 LAB — CBC
HCT: 43 % (ref 36.0–46.0)
Hemoglobin: 14.3 g/dL (ref 12.0–15.0)
MCHC: 33.2 g/dL (ref 30.0–36.0)
MCV: 90.1 fL (ref 78.0–100.0)
Platelets: ADEQUATE 10*3/uL (ref 150–400)
RBC: 4.78 MIL/uL (ref 3.87–5.11)
RDW: 13.1 % (ref 11.5–15.5)
WBC: 11.8 10*3/uL — ABNORMAL HIGH (ref 4.0–10.5)

## 2010-11-10 LAB — POCT CARDIAC MARKERS
CKMB, poc: <1 ng/mL — ABNORMAL LOW (ref 1.0–8.0)
Myoglobin, poc: 37.6 ng/mL (ref 12–200)
Troponin i, poc: <0.05 ng/mL (ref 0.00–0.09)

## 2010-11-17 LAB — APTT: aPTT: 26

## 2010-11-17 LAB — CARDIAC PANEL(CRET KIN+CKTOT+MB+TROPI)
CK, MB: 1.5
CK, MB: 1.6
Relative Index: INVALID
Relative Index: INVALID
Total CK: 62
Total CK: 78
Troponin I: 0.02
Troponin I: 0.04

## 2010-11-17 LAB — BASIC METABOLIC PANEL
BUN: 9
CO2: 27
Calcium: 9.5
Chloride: 104
Creatinine, Ser: 0.62
GFR calc Af Amer: >60
GFR calc non Af Amer: >60
Glucose, Bld: 98
Potassium: 3.7
Sodium: 139

## 2010-11-17 LAB — I-STAT 8, (EC8 V) (CONVERTED LAB)
Acid-Base Excess: 4 — ABNORMAL HIGH
BUN: 12
Bicarbonate: 29 — ABNORMAL HIGH
Chloride: 103
Glucose, Bld: 121 — ABNORMAL HIGH
HCT: 44
Hemoglobin: 15
Operator id: 288331
Potassium: 3.9
Sodium: 138
TCO2: 30
pCO2, Ven: 44.3 — ABNORMAL LOW
pH, Ven: 7.425 — ABNORMAL HIGH

## 2010-11-17 LAB — CBC
HCT: 38.3
HCT: 40.5
Hemoglobin: 12.9
Hemoglobin: 13.9
MCHC: 33.8
MCHC: 34.3
MCV: 87.9
MCV: 89.9
Platelets: 321
Platelets: 346
RBC: 4.26
RBC: 4.6
RDW: 12.1
RDW: 12.3
WBC: 7.3
WBC: 8.8

## 2010-11-17 LAB — COMPREHENSIVE METABOLIC PANEL
ALT: 37 — ABNORMAL HIGH
AST: 24
Albumin: 3.7
Alkaline Phosphatase: 66
BUN: 12
CO2: 29
Calcium: 9.5
Chloride: 102
Creatinine, Ser: 0.62
GFR calc Af Amer: >60
GFR calc non Af Amer: >60
Glucose, Bld: 123 — ABNORMAL HIGH
Potassium: 4
Sodium: 137
Total Bilirubin: 0.6
Total Protein: 7

## 2010-11-17 LAB — DIFFERENTIAL
Basophils Absolute: 0
Basophils Relative: 0
Eosinophils Absolute: 0.2
Eosinophils Relative: 2
Lymphocytes Relative: 30
Lymphs Abs: 2.2
Monocytes Absolute: 0.5
Monocytes Relative: 7
Neutro Abs: 4.4
Neutrophils Relative %: 60

## 2010-11-17 LAB — POCT CARDIAC MARKERS
CKMB, poc: <1 — ABNORMAL LOW
Myoglobin, poc: 67.3
Operator id: 288331
Troponin i, poc: <0.05

## 2010-11-17 LAB — PROTIME-INR
INR: 0.9
INR: 1.1
Prothrombin Time: 12.6
Prothrombin Time: 14

## 2010-11-17 LAB — LIPID PANEL
Cholesterol: 183
HDL: 49
LDL Cholesterol: 123 — ABNORMAL HIGH
Total CHOL/HDL Ratio: 3.7
Triglycerides: 54
VLDL: 11

## 2010-11-17 LAB — CK TOTAL AND CKMB (NOT AT ARMC)
CK, MB: 1.6
Relative Index: INVALID
Total CK: 66

## 2010-11-17 LAB — D-DIMER, QUANTITATIVE: D-Dimer, Quant: 0.28

## 2010-11-17 LAB — POCT I-STAT CREATININE
Creatinine, Ser: 0.7
Operator id: 288331

## 2010-11-17 LAB — TROPONIN I: Troponin I: 0.01

## 2010-11-17 LAB — HEPARIN LEVEL (UNFRACTIONATED): Heparin Unfractionated: 1.13 — ABNORMAL HIGH

## 2010-11-17 NOTE — Progress Notes (Signed)
Addended by: Maura Crandall on: 11/17/2010 03:13 PM   Modules accepted: Orders

## 2010-12-02 ENCOUNTER — Other Ambulatory Visit: Payer: Self-pay | Admitting: Internal Medicine

## 2011-01-01 ENCOUNTER — Ambulatory Visit (INDEPENDENT_AMBULATORY_CARE_PROVIDER_SITE_OTHER): Payer: Medicare Other | Admitting: Internal Medicine

## 2011-01-01 ENCOUNTER — Encounter: Payer: Medicare Other | Admitting: Internal Medicine

## 2011-01-01 ENCOUNTER — Encounter: Payer: Self-pay | Admitting: Internal Medicine

## 2011-01-01 DIAGNOSIS — I10 Essential (primary) hypertension: Secondary | ICD-10-CM

## 2011-01-01 DIAGNOSIS — R5383 Other fatigue: Secondary | ICD-10-CM

## 2011-01-01 DIAGNOSIS — R5381 Other malaise: Secondary | ICD-10-CM

## 2011-01-01 NOTE — Assessment & Plan Note (Signed)
Well controlled on current treatment, No new changes made today, Will continue to monitor.   

## 2011-01-01 NOTE — Patient Instructions (Signed)

## 2011-01-01 NOTE — Assessment & Plan Note (Signed)
Patient is likely depressed and is experiencing fatigue to be bereavement of her nephews recent death. Patient denies SI/HI. I have discussed with the patient this matter in details and we decided not to pursue further testing today to r/o secondary causes of her fatigue such as anemia, or thyroid pathology.  Will reevaluate in 1 month.
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
## 2011-02-09 ENCOUNTER — Encounter (HOSPITAL_COMMUNITY): Payer: Self-pay

## 2011-02-09 ENCOUNTER — Encounter: Payer: Self-pay | Admitting: *Deleted

## 2011-02-09 ENCOUNTER — Emergency Department (INDEPENDENT_AMBULATORY_CARE_PROVIDER_SITE_OTHER)
Admission: EM | Admit: 2011-02-09 | Discharge: 2011-02-09 | Disposition: A | Discharge status: Home / Self Care | Payer: Medicare Other | Source: Home / Self Care | Attending: Family Medicine | Admitting: Family Medicine

## 2011-02-09 DIAGNOSIS — J40 Bronchitis, not specified as acute or chronic: Secondary | ICD-10-CM

## 2011-02-09 MED ORDER — ALBUTEROL SULFATE HFA 108 (90 BASE) MCG/ACT IN AERS: 2.0000 | INHALATION_SPRAY | RESPIRATORY_TRACT | Status: DC | PRN

## 2011-02-09 MED ORDER — AZITHROMYCIN 250 MG PO TABS: 250.0000 mg | ORAL_TABLET | Freq: Every day | ORAL | Status: AC

## 2011-02-09 MED ORDER — GUAIFENESIN-CODEINE 100-10 MG/5ML PO SYRP: 5.0000 mL | ORAL_SOLUTION | Freq: Four times a day (QID) | ORAL | Status: AC | PRN

## 2011-02-09 NOTE — Progress Notes (Signed)
Agree with referral to ED/UCC.

## 2011-02-09 NOTE — Progress Notes (Signed)
Pt presents at front desk c/o flu states she "is so far gone with it now" that the cough is horrible, states she sometimes coughs so much she " passes out" ??? Denies fever. She is referred to urg care or ED for eval, there are no available appts this pm.

## 2011-02-09 NOTE — ED Provider Notes (Signed)
History     CSN: [id number]  Arrival date & time 02/09/11  1553   First MD Initiated Contact with Patient 02/09/11 1741      Chief Complaint  Patient presents with  . Cough    (Consider location/radiation/quality/duration/timing/severity/associated sxs/prior treatment) HPI Comments: Kara Mendoza presents for evaluation of persistent cough, reportedly over 3 months. She still smokes 4 to 6 cigarettes, down from 2 packs a day prior to her CVA in 2007; she has a speech deficit. She denies any fever and states that she has had bronchitis multiple times.   Patient is a 61 y.o. female presenting with cough. The history is provided by the patient.  Cough This is a new problem. The current episode started more than 1 week ago. The problem occurs constantly. The problem has not changed since onset.The cough is non-productive. There has been no fever. Associated symptoms include shortness of breath and wheezing. She is a smoker. Her past medical history is significant for bronchitis.    History reviewed. No pertinent past medical history.  History reviewed. No pertinent past surgical history.  History reviewed. No pertinent family history.  History  Substance Use Topics  . Smoking status: Current Everyday Smoker -- 0.2 packs/day    Types: Cigarettes  . Smokeless tobacco: Not on file  . Alcohol Use: No    OB History    Grav Para Term Preterm Abortions TAB SAB Ect Mult Living                  Review of Systems  Constitutional: Negative.   HENT: Negative.   Eyes: Negative.   Respiratory: Positive for cough, shortness of breath and wheezing.   Cardiovascular: Negative.   Gastrointestinal: Negative.   Genitourinary: Negative.   Musculoskeletal: Negative.   Skin: Negative.   Neurological: Negative.     Allergies  Penicillins  Home Medications   Current Outpatient Rx  Name Route Sig Dispense Refill  . ALBUTEROL SULFATE HFA 108 (90 BASE) MCG/ACT IN AERS Inhalation Inhale 2 puffs  into the lungs every 6 (six) hours as needed for wheezing. 1 Inhaler 5  . ALBUTEROL SULFATE HFA 108 (90 BASE) MCG/ACT IN AERS Inhalation Inhale 2 puffs into the lungs every 4 (four) hours as needed for wheezing or shortness of breath. 1 Inhaler 0  . AZITHROMYCIN 250 MG PO TABS Oral Take 1 tablet (250 mg total) by mouth daily. Take two tablets on first day, then one tablet each day for four days 6 tablet 0  . CYMBALTA 60 MG PO CPEP  TAKE 1 CAPSULE EVERY DAY 30 capsule 2  . ASPIRIN-DIPYRIDAMOLE 25-200 MG PO CP12 Oral Take 1 capsule by mouth 2 (two) times daily. 60 capsule 11  . FLUTICASONE PROPIONATE 50 MCG/ACT NA SUSP Nasal Place 1 spray into the nose daily. 16 g 2  . FLUTICASONE-SALMETEROL 500-50 MCG/DOSE IN AEPB Inhalation Inhale 1 puff into the lungs 2 (two) times daily. 1 each 3  . GUAIFENESIN-CODEINE 100-10 MG/5ML PO SYRP Oral Take 5 mLs by mouth every 6 (six) hours as needed for cough or congestion. 120 mL 0  . LOSARTAN POTASSIUM-HCTZ 100-25 MG PO TABS Oral Take 1 tablet by mouth daily. 30 tablet 11  . NITROGLYCERIN 0.4 MG SL SUBL Sublingual Place 1 tablet (0.4 mg total) under the tongue every 5 (five) minutes as needed for chest pain. 20 tablet 3  . OMEPRAZOLE 40 MG PO CPDR Oral Take 1 capsule (40 mg total) by mouth daily. 30 capsule 11  .  PREGABALIN 200 MG PO CAPS Oral Take 1 capsule (200 mg total) by mouth 2 (two) times daily. 60 capsule 11  . SIMVASTATIN 20 MG PO TABS Oral Take 1 tablet (20 mg total) by mouth at bedtime. 30 tablet 11    BP 139/75  Pulse 108  Temp(Src) 99 F (37.2 C) (Oral)  Resp 22  SpO2 100%  Physical Exam  Nursing note and vitals reviewed. Constitutional: She is oriented to person, place, and time. She appears well-developed and well-nourished.  HENT:  Head: Normocephalic and atraumatic.  Eyes: EOM are normal.  Neck: Normal range of motion.  Pulmonary/Chest: Effort normal. She has wheezes in the right upper field, the right middle field, the right lower field,  the left upper field, the left middle field and the left lower field.  Musculoskeletal: Normal range of motion.  Neurological: She is alert and oriented to person, place, and time.  Skin: Skin is warm and dry.  Psychiatric: Her behavior is normal.    ED Course  Procedures (including critical care time)  Labs Reviewed - No data to display No results found.   1. Bronchitis       MDM          Richardo Priest, MD 02/12/11 1225

## 2011-02-09 NOTE — ED Notes (Signed)
C/o 3 month duration of cough; c/o yellow /gren secretions, and unable to sleep at night due to cough; NAD

## 2011-02-22 ENCOUNTER — Other Ambulatory Visit: Payer: Self-pay | Admitting: Internal Medicine

## 2011-03-01 ENCOUNTER — Telehealth: Payer: Self-pay | Admitting: *Deleted

## 2011-03-07 NOTE — Telephone Encounter (Signed)
Empty request 

## 2011-05-17 ENCOUNTER — Ambulatory Visit (INDEPENDENT_AMBULATORY_CARE_PROVIDER_SITE_OTHER): Payer: Medicare Other | Admitting: Internal Medicine

## 2011-05-17 ENCOUNTER — Encounter: Payer: Self-pay | Admitting: Internal Medicine

## 2011-05-17 VITALS — BP 130/79 | HR 75 | Temp 98.6°F | Ht 62.0 in | Wt 177.4 lb

## 2011-05-17 DIAGNOSIS — K219 Gastro-esophageal reflux disease without esophagitis: Secondary | ICD-10-CM

## 2011-05-17 DIAGNOSIS — Z599 Problem related to housing and economic circumstances, unspecified: Secondary | ICD-10-CM

## 2011-05-17 DIAGNOSIS — E785 Hyperlipidemia, unspecified: Secondary | ICD-10-CM

## 2011-05-17 DIAGNOSIS — I6350 Cerebral infarction due to unspecified occlusion or stenosis of unspecified cerebral artery: Secondary | ICD-10-CM

## 2011-05-17 DIAGNOSIS — Z598 Other problems related to housing and economic circumstances: Secondary | ICD-10-CM

## 2011-05-17 DIAGNOSIS — I639 Cerebral infarction, unspecified: Secondary | ICD-10-CM

## 2011-05-17 DIAGNOSIS — H539 Unspecified visual disturbance: Secondary | ICD-10-CM

## 2011-05-17 DIAGNOSIS — J45909 Unspecified asthma, uncomplicated: Secondary | ICD-10-CM

## 2011-05-17 DIAGNOSIS — I10 Essential (primary) hypertension: Secondary | ICD-10-CM

## 2011-05-17 DIAGNOSIS — R32 Unspecified urinary incontinence: Secondary | ICD-10-CM

## 2011-05-17 DIAGNOSIS — Z5989 Other problems related to housing and economic circumstances: Secondary | ICD-10-CM

## 2011-05-17 MED ORDER — SIMVASTATIN 20 MG PO TABS: 20.0000 mg | ORAL_TABLET | Freq: Every day | ORAL | Status: DC

## 2011-05-17 MED ORDER — ASPIRIN-DIPYRIDAMOLE ER 25-200 MG PO CP12: 1.0000 | ORAL_CAPSULE | Freq: Two times a day (BID) | ORAL | Status: DC

## 2011-05-17 MED ORDER — PANTOPRAZOLE SODIUM 40 MG PO TBEC: 40.0000 mg | DELAYED_RELEASE_TABLET | Freq: Every day | ORAL | Status: DC

## 2011-05-17 MED ORDER — DIAPERS & SUPPLIES MISC: 1.0000 | Freq: Four times a day (QID) | Status: DC | PRN

## 2011-05-17 MED ORDER — LOSARTAN POTASSIUM-HCTZ 100-25 MG PO TABS: 1.0000 | ORAL_TABLET | Freq: Every day | ORAL | Status: DC

## 2011-05-17 MED ORDER — ALBUTEROL SULFATE HFA 108 (90 BASE) MCG/ACT IN AERS: 2.0000 | INHALATION_SPRAY | Freq: Four times a day (QID) | RESPIRATORY_TRACT | Status: DC | PRN

## 2011-05-17 NOTE — Patient Instructions (Signed)
Please, follow up with our Social worker Ms. Lynnae January. Please, follow up with your eye exam. Please, pick up your precipitations from your pharmacy Make an appointment for a PAP. Call with any questions.

## 2011-05-17 NOTE — Progress Notes (Signed)
Patient ID: Kara Mendoza, female   DOB: January 20, 1951, 61 y.o.   MRN: [id number] HPI:    1. GERD, was well controlled with omeprazole. However, patient states "that medicare does not cover it anymore." reports bitter taste in her mouth and heartburn; worse with meals. Denies any increase in cough, hemoptysis, weight loss, dysphagia, odynophagia or any other Sx. Review of Systems: Negative except per history of present illness  Physical Exam:  Nursing notes and vitals reviewed General:  alert, well-developed, and cooperative to examination.   Lungs:  normal respiratory effort, no accessory muscle use, normal breath sounds, no crackles, and no wheezes. Heart:  normal rate, regular rhythm, no murmurs, no gallop, and no rub.   Abdomen:  soft, non-tender, normal bowel sounds, no distention, no guarding, no rebound tenderness, no hepatomegaly, and no splenomegaly.   Extremities:  No cyanosis, clubbing, edema Neurologic:  alert & oriented X3, nonfocal exam  Meds: Medications Prior to Admission  Medication Sig Dispense Refill  . albuterol (PROAIR HFA) 108 (90 BASE) MCG/ACT inhaler Inhale 2 puffs into the lungs every 6 (six) hours as needed for wheezing.  1 Inhaler  5  . albuterol (PROVENTIL HFA;VENTOLIN HFA) 108 (90 BASE) MCG/ACT inhaler Inhale 2 puffs into the lungs every 4 (four) hours as needed for wheezing or shortness of breath.  1 Inhaler  0  . CYMBALTA 60 MG capsule TAKE 1 CAPSULE EVERY DAY  30 capsule  2  . dipyridamole-aspirin (AGGRENOX) 25-200 MG per 12 hr capsule Take 1 capsule by mouth 2 (two) times daily.  60 capsule  11  . fluticasone (FLONASE) 50 MCG/ACT nasal spray Place 1 spray into the nose daily.  16 g  2  . Fluticasone-Salmeterol (ADVAIR DISKUS) 500-50 MCG/DOSE AEPB Inhale 1 puff into the lungs 2 (two) times daily.  1 each  3  . losartan-hydrochlorothiazide (HYZAAR) 100-25 MG per tablet Take 1 tablet by mouth daily.  30 tablet  11  . nitroGLYCERIN (NITROSTAT) 0.4 MG SL tablet Place  1 tablet (0.4 mg total) under the tongue every 5 (five) minutes as needed for chest pain.  20 tablet  3  . omeprazole (PRILOSEC) 40 MG capsule Take 1 capsule (40 mg total) by mouth daily.  30 capsule  11  . pregabalin (LYRICA) 200 MG capsule Take 1 capsule (200 mg total) by mouth 2 (two) times daily.  60 capsule  11  . simvastatin (ZOCOR) 20 MG tablet Take 1 tablet (20 mg total) by mouth at bedtime.  30 tablet  11   No current facility-administered medications on file as of 05/17/2011.    Allergies: Penicillins No past medical history on file. No past surgical history on file. No family history on file. History   Social History  . Marital Status: Single    Spouse Name: N/A    Number of Children: N/A  . Years of Education: N/A   Occupational History  . Not on file.   Social History Main Topics  . Smoking status: Current Everyday Smoker -- 0.2 packs/day    Types: Cigarettes  . Smokeless tobacco: Not on file  . Alcohol Use: No  . Drug Use: No  . Sexually Active: Not on file   Other Topics Concern  . Not on file   Social History Narrative  . No narrative on file    A/P: 1. GERD -unable to afford Omeprazole  -will change to Protonix -no late night meals, tight clothing  2. Visual disturbance - presbyopia? -referred to an  ophthalmologist  3. Chronic bronchitis -stable -continue with Albuterol and Advair  4. Financial strain/homelessness -referred to SW Ms. Mosetta Putt ASAP

## 2011-05-18 ENCOUNTER — Other Ambulatory Visit: Payer: Self-pay | Admitting: Internal Medicine

## 2011-05-21 ENCOUNTER — Telehealth: Payer: Self-pay | Admitting: Licensed Clinical Social Worker
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
## 2011-05-22 ENCOUNTER — Other Ambulatory Visit: Payer: Self-pay | Admitting: *Deleted

## 2011-05-22 DIAGNOSIS — G629 Polyneuropathy, unspecified: Secondary | ICD-10-CM

## 2011-05-22 MED ORDER — DULOXETINE HCL 60 MG PO CPEP: 60.0000 mg | ORAL_CAPSULE | Freq: Every day | ORAL | Status: DC

## 2011-05-22 NOTE — Telephone Encounter (Signed)
Pt called CSW and states that her landlord will let her out of the lease and return her deposit, she has a 15 day notice to vacant.  Pt states she has found another apartment but needs help with the application.  Pt states she needs help completing application and does not have much money for gas to drive back and forth.  [facility] distance too far for pt, for CSW to assist with application.  Pt aware of [facility] location and [facility].  CSW encouraged pt to call [facility] to see if there is someone available to assist with application and if pt could have application faxed to [facility], completed with pt and then faxed back to Apartment complex.  CSW provided support to pt to call both [facility] and [facility] prior to driving to agencies. Pt aware CSW is available to assist as needed.

## 2011-05-22 NOTE — Telephone Encounter (Signed)
[facility]:  Requesting refill on Cymbalta 60mg  - take 1 cap daily. Thanks

## 2011-05-24 ENCOUNTER — Other Ambulatory Visit: Payer: Self-pay | Admitting: Internal Medicine

## 2011-05-24 ENCOUNTER — Telehealth: Payer: Self-pay | Admitting: Licensed Clinical Social Worker

## 2011-05-24 DIAGNOSIS — K219 Gastro-esophageal reflux disease without esophagitis: Secondary | ICD-10-CM

## 2011-05-24 MED ORDER — ESOMEPRAZOLE MAGNESIUM 40 MG PO CPDR: 40.0000 mg | DELAYED_RELEASE_CAPSULE | Freq: Every day | ORAL | Status: DC

## 2011-05-24 NOTE — Telephone Encounter (Signed)
Pt called CSW to state her insurance no longer covers Protonix and would like new prescription.  Pt notified CSW will forward to physician.

## 2011-06-19 ENCOUNTER — Telehealth: Payer: Self-pay | Admitting: *Deleted

## 2011-06-19 DIAGNOSIS — K219 Gastro-esophageal reflux disease without esophagitis: Secondary | ICD-10-CM

## 2011-06-19 NOTE — Telephone Encounter (Signed)
Has tried taking Nexium - felt worse on med - pt stopped med. Wants to go back on Omeprazole. [facility] [facility] Shaguana Love RN 06/19/11 3:15PM

## 2011-06-20 MED ORDER — OMEPRAZOLE 40 MG PO CPDR: 40.0000 mg | DELAYED_RELEASE_CAPSULE | Freq: Every day | ORAL | Status: DC

## 2011-06-20 NOTE — Telephone Encounter (Signed)
nexium d/c'd and omeprazole script sent to pharmacy

## 2011-06-21 MED ORDER — OMEPRAZOLE 20 MG PO CPDR: 20.0000 mg | DELAYED_RELEASE_CAPSULE | Freq: Two times a day (BID) | ORAL | Status: DC

## 2011-06-21 NOTE — Telephone Encounter (Signed)
New Rx called in

## 2011-06-21 NOTE — Telephone Encounter (Signed)
Addended by: Darnelle Maffucci on: 06/21/2011 01:32 PM   Modules accepted: Orders

## 2011-06-21 NOTE — Telephone Encounter (Signed)
Hey i reordered this as 20mg  bid, i put it in as a call in, please call to whichever pharmacy to compete it. Thanks.

## 2011-06-21 NOTE — Telephone Encounter (Signed)
[facility] that pt's insurance will not cover omeprazole 40 mg.  I called [facility]/Medicare and they will cover Omeprazole 20 mg tabs.  They do cover 2 tabs a day.  Can you reorder and resend??

## 2011-06-22 ENCOUNTER — Telehealth: Payer: Self-pay | Admitting: Licensed Clinical Social Worker

## 2011-06-22 NOTE — Telephone Encounter (Signed)
CSW received voice mail from Ms. Devinney requesting referral for [facility] services.  CSW returned call to Ms. Cando.  Confirmed pt's request and the services she was requesting.  CSW explained referral process, new referral form will need to be completed and signed by PCP and faxed to [facility] and [employer].  Pt informed state will complete assessment to determine eligibility.  CSW will inform [facility] case worker, Tonji, per pt request.

## 2011-06-29 ENCOUNTER — Other Ambulatory Visit: Payer: Self-pay | Admitting: Internal Medicine

## 2011-06-29 DIAGNOSIS — Z1231 Encounter for screening mammogram for malignant neoplasm of breast: Secondary | ICD-10-CM

## 2011-07-04 ENCOUNTER — Telehealth: Payer: Self-pay | Admitting: Licensed Clinical Social Worker

## 2011-07-04 NOTE — Telephone Encounter (Signed)
Kara Mendoza left message inquiring about her request for [facility] services.  CSW returned call.  Kara Mendoza states she received call from [facility] and is scheduled for an assessment today at 4:00 pm.

## 2011-07-25 ENCOUNTER — Ambulatory Visit (HOSPITAL_COMMUNITY)
Admission: RE | Admit: 2011-07-25 | Discharge: 2011-07-25 | Disposition: A | Discharge status: Home / Self Care | Payer: Medicare Other | Source: Ambulatory Visit | Attending: Internal Medicine | Admitting: Internal Medicine

## 2011-07-25 DIAGNOSIS — Z1231 Encounter for screening mammogram for malignant neoplasm of breast: Secondary | ICD-10-CM | POA: Insufficient documentation

## 2011-08-03 ENCOUNTER — Other Ambulatory Visit: Payer: Self-pay | Admitting: *Deleted

## 2011-08-03 DIAGNOSIS — R053 Chronic cough: Secondary | ICD-10-CM

## 2011-08-03 DIAGNOSIS — R05 Cough: Secondary | ICD-10-CM

## 2011-08-03 MED ORDER — FLUTICASONE PROPIONATE 50 MCG/ACT NA SUSP: 1.0000 | Freq: Every day | NASAL | Status: DC

## 2011-08-31 ENCOUNTER — Telehealth: Payer: Self-pay | Admitting: Licensed Clinical Social Worker

## 2011-08-31 ENCOUNTER — Encounter: Payer: Self-pay | Admitting: Licensed Clinical Social Worker

## 2011-08-31 NOTE — Telephone Encounter (Signed)
CSW received message from [facility] case worker, pt is in need of new PCS agency.  CSW placed call to Ms. Wigington.  Pt states she was using [facility] but was not satisfied with their services.  Ms. Hoe states [facility] informed her she will need to find a new agency.  CSW offered to provide pt with agencies over the phone or to mail out.  Pt requesting CSW to mail listing out.

## 2011-09-14 ENCOUNTER — Other Ambulatory Visit: Payer: Self-pay | Admitting: *Deleted

## 2011-09-14 DIAGNOSIS — I10 Essential (primary) hypertension: Secondary | ICD-10-CM

## 2011-09-14 DIAGNOSIS — J45998 Other asthma: Secondary | ICD-10-CM

## 2011-09-14 DIAGNOSIS — E785 Hyperlipidemia, unspecified: Secondary | ICD-10-CM

## 2011-09-14 DIAGNOSIS — G629 Polyneuropathy, unspecified: Secondary | ICD-10-CM

## 2011-09-14 DIAGNOSIS — K219 Gastro-esophageal reflux disease without esophagitis: Secondary | ICD-10-CM

## 2011-09-14 MED ORDER — ALBUTEROL SULFATE HFA 108 (90 BASE) MCG/ACT IN AERS: 2.0000 | INHALATION_SPRAY | Freq: Four times a day (QID) | RESPIRATORY_TRACT | Status: DC | PRN

## 2011-09-14 MED ORDER — FLUTICASONE-SALMETEROL 500-50 MCG/DOSE IN AEPB: 1.0000 | INHALATION_SPRAY | Freq: Two times a day (BID) | RESPIRATORY_TRACT | Status: DC

## 2011-09-14 MED ORDER — PREGABALIN 200 MG PO CAPS: 200.0000 mg | ORAL_CAPSULE | Freq: Two times a day (BID) | ORAL | Status: DC

## 2011-09-14 MED ORDER — ESOMEPRAZOLE MAGNESIUM 40 MG PO CPDR: 40.0000 mg | DELAYED_RELEASE_CAPSULE | Freq: Every day | ORAL | Status: DC

## 2011-09-14 MED ORDER — LOSARTAN POTASSIUM-HCTZ 100-25 MG PO TABS: 1.0000 | ORAL_TABLET | Freq: Every day | ORAL | Status: DC

## 2011-09-14 MED ORDER — SIMVASTATIN 20 MG PO TABS: 20.0000 mg | ORAL_TABLET | Freq: Every day | ORAL | Status: DC

## 2011-09-14 MED ORDER — ASPIRIN-DIPYRIDAMOLE ER 25-200 MG PO CP12: 1.0000 | ORAL_CAPSULE | Freq: Two times a day (BID) | ORAL | Status: DC

## 2011-09-14 MED ORDER — NITROGLYCERIN 0.4 MG SL SUBL: 0.4000 mg | SUBLINGUAL_TABLET | SUBLINGUAL | Status: DC | PRN

## 2011-09-14 MED ORDER — DULOXETINE HCL 60 MG PO CPEP: 60.0000 mg | ORAL_CAPSULE | Freq: Every day | ORAL | Status: DC

## 2011-09-14 NOTE — Telephone Encounter (Signed)
Pt is changing pharmacies, her original one closed, she must have delivery service

## 2011-09-17 ENCOUNTER — Telehealth: Payer: Self-pay | Admitting: *Deleted

## 2011-09-17 NOTE — Telephone Encounter (Signed)
Have tried to call pt to schedule appt, unable to leave message or speak w/ pt

## 2011-09-19 ENCOUNTER — Other Ambulatory Visit: Payer: Self-pay | Admitting: *Deleted

## 2011-09-19 ENCOUNTER — Other Ambulatory Visit: Payer: Self-pay | Admitting: Licensed Clinical Social Worker

## 2011-09-19 NOTE — Telephone Encounter (Signed)
Try to call her later this week one more time. Thanks.

## 2011-09-19 NOTE — Telephone Encounter (Signed)
Ms. Clendenin placed call to CSW irritated that CSW had not returned call regarding medication.  CSW informed Ms. Sherrill once again that CSW can not provide medication refills and pt would need to call the refill line.  CSW provided Ms. Kauer to main number and transferred to RN working refills today.

## 2011-09-19 NOTE — Telephone Encounter (Signed)
i have tried to reach pt also and so has [facility]

## 2011-09-19 NOTE — Telephone Encounter (Signed)
i have called pt and also called to find a pharmacy for her, finally [facility] due to the fact they will go to her home and complete paperwork

## 2011-09-25 ENCOUNTER — Other Ambulatory Visit: Payer: Self-pay | Admitting: *Deleted

## 2011-09-25 DIAGNOSIS — K219 Gastro-esophageal reflux disease without esophagitis: Secondary | ICD-10-CM

## 2011-09-25 MED ORDER — OMEPRAZOLE 20 MG PO CPDR: 20.0000 mg | DELAYED_RELEASE_CAPSULE | Freq: Two times a day (BID) | ORAL | Status: DC

## 2011-10-11 ENCOUNTER — Ambulatory Visit (INDEPENDENT_AMBULATORY_CARE_PROVIDER_SITE_OTHER): Payer: Medicare Other | Admitting: Internal Medicine

## 2011-10-11 ENCOUNTER — Encounter: Payer: Self-pay | Admitting: Internal Medicine

## 2011-10-11 VITALS — BP 118/69 | HR 93 | Temp 98.6°F | Ht 62.0 in | Wt 175.6 lb

## 2011-10-11 DIAGNOSIS — I639 Cerebral infarction, unspecified: Secondary | ICD-10-CM

## 2011-10-11 DIAGNOSIS — S335XXA Sprain of ligaments of lumbar spine, initial encounter: Secondary | ICD-10-CM

## 2011-10-11 DIAGNOSIS — E8881 Metabolic syndrome: Secondary | ICD-10-CM

## 2011-10-11 DIAGNOSIS — E785 Hyperlipidemia, unspecified: Secondary | ICD-10-CM

## 2011-10-11 DIAGNOSIS — Z131 Encounter for screening for diabetes mellitus: Secondary | ICD-10-CM

## 2011-10-11 DIAGNOSIS — S39012A Strain of muscle, fascia and tendon of lower back, initial encounter: Secondary | ICD-10-CM

## 2011-10-11 DIAGNOSIS — X58XXXA Exposure to other specified factors, initial encounter: Secondary | ICD-10-CM

## 2011-10-11 DIAGNOSIS — F17200 Nicotine dependence, unspecified, uncomplicated: Secondary | ICD-10-CM

## 2011-10-11 DIAGNOSIS — F329 Major depressive disorder, single episode, unspecified: Secondary | ICD-10-CM

## 2011-10-11 DIAGNOSIS — Z79899 Other long term (current) drug therapy: Secondary | ICD-10-CM

## 2011-10-11 DIAGNOSIS — I10 Essential (primary) hypertension: Secondary | ICD-10-CM

## 2011-10-11 DIAGNOSIS — Z139 Encounter for screening, unspecified: Secondary | ICD-10-CM

## 2011-10-11 LAB — CBC WITH DIFFERENTIAL/PLATELET
Basophils Absolute: 0 10*3/uL (ref 0.0–0.1)
Basophils Relative: 0 % (ref 0–1)
Eosinophils Absolute: 0.1 10*3/uL (ref 0.0–0.7)
Eosinophils Relative: 2 % (ref 0–5)
HCT: 39.3 % (ref 36.0–46.0)
Hemoglobin: 13.5 g/dL (ref 12.0–15.0)
Lymphocytes Relative: 31 % (ref 12–46)
Lymphs Abs: 2.3 10*3/uL (ref 0.7–4.0)
MCH: 30.6 pg (ref 26.0–34.0)
MCHC: 34.4 g/dL (ref 30.0–36.0)
MCV: 89.1 fL (ref 78.0–100.0)
Monocytes Absolute: 0.4 10*3/uL (ref 0.1–1.0)
Monocytes Relative: 5 % (ref 3–12)
Neutro Abs: 4.6 10*3/uL (ref 1.7–7.7)
Neutrophils Relative %: 62 % (ref 43–77)
Platelets: 359 10*3/uL (ref 150–400)
RBC: 4.41 MIL/uL (ref 3.87–5.11)
RDW: 12.5 % (ref 11.5–15.5)
WBC: 7.4 10*3/uL (ref 4.0–10.5)

## 2011-10-11 LAB — LIPID PANEL
Cholesterol: 134 mg/dL (ref 0–200)
HDL: 44 mg/dL (ref >39)
LDL Cholesterol: 75 mg/dL (ref 0–99)
Total CHOL/HDL Ratio: 3 Ratio
Triglycerides: 75 mg/dL (ref <150)
VLDL: 15 mg/dL (ref 0–40)

## 2011-10-11 LAB — TSH: TSH: 0.949 u[IU]/mL (ref 0.350–4.500)

## 2011-10-11 LAB — POCT GLYCOSYLATED HEMOGLOBIN (HGB A1C): Hemoglobin A1C: 5.7

## 2011-10-11 NOTE — Progress Notes (Signed)
  Subjective:    Patient ID: Kara Mendoza, female    DOB: 1950/06/27, 61 y.o.   MRN: [id number]  HPI 61 yr. Old AAF w/ pmhx significant for CVA w/ residual LUE/LLE weakness as well as speech deficits in 2007, uses a cane, HTN, HL, OA, mild-intermittent asthma, tobacco abuse, allergic rhinitis, GERD, presents for follow up appointment.  She states she's been having back pain for two weeks. She denies fever, chills. She denies any trauma. She describes the back pain as an achy pain, sometimes throbbing, that worsens with movement.  Denies dysuria. She states her skin gets dry at times, feels her hair is slowly thinning, also concerned about some "red eye". She is concerned about getting her medications due to transportation issues. Denies recent falls, admits to urinary incontinence which she describes as urge incontinence. She denies CP. She denies significant SOB with exertion, but admits to some SOB at times. She denies melena, hematochezia. She denies vaginal drainage. She states she has not had a PAP in 3 years. She recently had a mammogram which is noted to be BIRADS 1. With respect to her back pain, she denies any saddle anesthesia or difficulty urinating. She has seen Dr. Molly Maduro L. Groat of Opth on 6/13 and was given an Rx for glasses, no mention of glaucoma or findings on dilated eye exam. She states she sees Dr. Wyline Beady for pain management and was prescribed oxycodone/acet 10/325 mg four times daily. She was given this medication for migraine HA.   Review of Systems The complete 13 point review of systems is otherwise negative except for that stated in the HPI.   Objective:   Physical Exam GEN: AAOx3, NAD. HEENT: EOMI, PERRLA, some slight scleral injection bilat, no icterus, no masses, no adenopathy. CV: S1S2, no m/r/g, RRR. PULM: CTA bilat. ABD/GI: Soft, NT, +BS, no guarding, no distention, no masses. LE/UE: 2/4 pulses, no c/c/e. Neuro: 4/5 strength LUE/LLE.Marland Kitchen Noted slow  speech, CNII-XII intact. MS: Tenderness to palpation over Left paraspinal muscles, FROM L/T spine active and passive.     Assessment & Plan:  61 yr. Old AAF w/ pmhx significant for CVA w/ residual LUE/LLE weakness as well as speech deficits in 2007, uses a cane, HTN, migraine HA, HL, OA, mild-intermittent asthma, depression NOS, tobacco abuse, allergic rhinitis, GERD, presents for follow up appointment. 1) Lumbar strain: She may use heating pad as needed, return as needed. She can use oxycodone/acet as needed up to four times daily. Educated on sedative effects. 2) HL: Continue statin. Check Lipid panel/LFT's today. 3) HTN: At goal. BP 118/69, continue current tx. 4) Tobacco abuse: Used to smoke a pack per day but is down to 4 cigarettes today. 5) Obesity: Refer to dietician, BMI is 32.1. Interested in losing weight. 6) Metabolic syndrome: hyperlipidemia, HTN, obesity. Treat as above. 7) s/p CVA: Uses cane. Continue aggrenox. Counseled on smoking cessation. 8) Depression NOS: On cymbalta, states this is helping her. No changes. Refuses any tx for insomnia, wants to "figure it out" herself. Offered OTC melatonin. 9) Health Maintenace: Mammogram completed this year, Colonoscopy was offered to patient but she declines. PAP is needed, refer to gyn. States she had tetanus booster 3-4 yrs ago.  She will perform FOBT cards.  Jonah Blue

## 2011-10-11 NOTE — Patient Instructions (Addendum)
Please follow up with Gyn for PAP smear. Obtain blood work today. F/U in three months. Return stool cards when done to lab.

## 2011-10-12 LAB — COMPLETE METABOLIC PANEL WITH GFR
ALT: 15 U/L (ref 0–35)
AST: 14 U/L (ref 0–37)
Albumin: 4 g/dL (ref 3.5–5.2)
Alkaline Phosphatase: 65 U/L (ref 39–117)
BUN: 14 mg/dL (ref 6–23)
CO2: 30 mEq/L (ref 19–32)
Calcium: 9.9 mg/dL (ref 8.4–10.5)
Chloride: 104 mEq/L (ref 96–112)
Creat: 0.85 mg/dL (ref 0.50–1.10)
GFR, Est African American: 85 mL/min
GFR, Est Non African American: 74 mL/min
Glucose, Bld: 96 mg/dL (ref 70–99)
Potassium: 4.7 mEq/L (ref 3.5–5.3)
Sodium: 140 mEq/L (ref 135–145)
Total Bilirubin: 0.4 mg/dL (ref 0.3–1.2)
Total Protein: 6.5 g/dL (ref 6.0–8.3)

## 2011-10-17 ENCOUNTER — Encounter: Payer: Self-pay | Admitting: Obstetrics & Gynecology

## 2011-10-18 ENCOUNTER — Encounter: Payer: Self-pay | Admitting: Licensed Clinical Social Worker

## 2011-10-18 ENCOUNTER — Telehealth: Payer: Self-pay | Admitting: Licensed Clinical Social Worker

## 2011-10-18 NOTE — Telephone Encounter (Signed)
CSW received message from Jacquelyne Balint, SW with [facility] working with [name].  Pt reports to T. Stang that she did not receive [employer] listing in the mail and has not resumed her PCS care.  CSW faxed T. Stang copy and placed another copy in the mail to pt.  Marcello Moores also will assist with switching pharmacy to [facility], med list needed.  CSW faxed T. Stang copy of pt's current med list.

## 2011-10-27 ENCOUNTER — Other Ambulatory Visit: Payer: Self-pay | Admitting: Internal Medicine

## 2011-11-01 ENCOUNTER — Telehealth: Payer: Self-pay | Admitting: Licensed Clinical Social Worker

## 2011-11-01 NOTE — Telephone Encounter (Signed)
Ms. Kittleson placed several voice message with CSW regarding her medications.  Pt states he is out of her BP medications and [facility] was working on switching her pharmacy.  CSW placed call to Ms. Marsiglia.  Pt states she had church member take her to pharmacy to get her BP medication.  Ms. Batley also met with some one from pharmacy agency on Wed. 9/25, pt could not remember name.  CSW inquired if it was with [facility] [phone].  Pt states is was with [facility].  CSW encouraged Ms. Rosero to contact Refill RN if she does run out of her medication or is not taking her medication.  Informed Ms. Rotondo, CSW can not call in refills. Pt aware and states new pharmacy will be managing refills for her.

## 2011-11-07 ENCOUNTER — Ambulatory Visit (INDEPENDENT_AMBULATORY_CARE_PROVIDER_SITE_OTHER): Payer: Medicare Other | Admitting: Obstetrics & Gynecology

## 2011-11-07 ENCOUNTER — Other Ambulatory Visit (HOSPITAL_COMMUNITY)
Admission: RE | Admit: 2011-11-07 | Discharge: 2011-11-07 | Disposition: A | Discharge status: Home / Self Care | Payer: Medicare Other | Source: Ambulatory Visit | Attending: Obstetrics & Gynecology | Admitting: Obstetrics & Gynecology

## 2011-11-07 ENCOUNTER — Encounter: Payer: Self-pay | Admitting: Obstetrics & Gynecology

## 2011-11-07 VITALS — BP 126/72 | HR 89 | Temp 96.7°F | Resp 12 | Ht 62.0 in | Wt 174.2 lb

## 2011-11-07 DIAGNOSIS — Z113 Encounter for screening for infections with a predominantly sexual mode of transmission: Secondary | ICD-10-CM | POA: Insufficient documentation

## 2011-11-07 DIAGNOSIS — Z124 Encounter for screening for malignant neoplasm of cervix: Secondary | ICD-10-CM | POA: Insufficient documentation

## 2011-11-07 DIAGNOSIS — Z1151 Encounter for screening for human papillomavirus (HPV): Secondary | ICD-10-CM | POA: Insufficient documentation

## 2011-11-07 DIAGNOSIS — Z01419 Encounter for gynecological examination (general) (routine) without abnormal findings: Secondary | ICD-10-CM

## 2011-11-07 NOTE — Patient Instructions (Signed)

## 2011-11-07 NOTE — Progress Notes (Signed)
Patient ID: Kara Mendoza, female   DOB: April 27, 1950, 61 y.o.   MRN: [id number] Subjective:     Kara Mendoza is a 61 y.o. female here for a routine exam.  Current complaints: pt c/o vaginal irritation since becoming sexually active 1 month previously.  Was not previously sexually active for 6 years.   Gynecologic History No LMP recorded. Patient is postmenopausal. Contraception: none Last Pap: 3 years. Results were: normal Last mammogram: 2013. Results were: normal  Obstetric History OB History    Grav Para Term Preterm Abortions TAB SAB Ect Mult Living                   The following portions of the patient's history were reviewed and updated as appropriate: allergies, current medications, past family history, past medical history, past social history, past surgical history and problem list.  Review of Systems A comprehensive review of systems was negative.    Objective:    BP 126/72  Pulse 89  Temp 96.7 F (35.9 C) (Oral)  Resp 12  Ht 5\' 2"  (1.575 m)  Wt 174 lb 3.2 oz (79.017 kg)  BMI 31.86 kg/m2  General Appearance:    Alert, cooperative, no distress, appears stated age  Head:    Normocephalic, without obvious abnormality, atraumatic              Neck:   Supple, symmetrical, trachea midline, no adenopathy;    thyroid:  no enlargement/tenderness/nodules; no carotid   bruit or JVD  Back:     Symmetric, no curvature, ROM normal, no CVA tenderness  Lungs:     Clear to auscultation bilaterally, respirations unlabored  Chest Wall:    No tenderness or deformity   Heart:    Regular rate and rhythm, S1 and S2 normal, no murmur, rub   or gallop  Breast Exam:    No tenderness, masses, or nipple abnormality  Abdomen:     Soft, non-tender, bowel sounds active all four quadrants,    no masses, no organomegaly. Well healed vertical incision.  Genitalia:    Normal female without lesion, discharge or tenderness GU: EGBUS: no lesions Vagina: no blood in vault Cervix: no  lesion; no mucopurulent d/c; stenotic Uterus: small, mobile Adnexa: no masses; difficult to assess due to body habitus      Extremities:   Extremities normal, atraumatic, no cyanosis or edema; bug bite on right thigh- healing well   Pulses:   2+ and symmetric all extremities  Skin:   Skin color, texture, turgor normal, no rashes or lesions  Lymph nodes:   Cervical, supraclavicular, and axillary nodes normal         Assessment:    Healthy female exam.  S/p CVA- seems top be doing well leukorrhea   Plan:    Follow up in: 1 year. F/u PAP with HPV, GC and chyamydia, trich, BV  Tyrail Grandfield L. Harraway-Smith, M.D., Evern Core

## 2011-11-27 ENCOUNTER — Other Ambulatory Visit: Payer: Self-pay | Admitting: *Deleted

## 2011-11-28 MED ORDER — DULOXETINE HCL 60 MG PO CPEP: 60.0000 mg | ORAL_CAPSULE | Freq: Every day | ORAL | Status: DC

## 2011-12-18 ENCOUNTER — Other Ambulatory Visit: Payer: Self-pay | Admitting: *Deleted

## 2011-12-18 DIAGNOSIS — K219 Gastro-esophageal reflux disease without esophagitis: Secondary | ICD-10-CM

## 2011-12-18 DIAGNOSIS — I10 Essential (primary) hypertension: Secondary | ICD-10-CM

## 2011-12-18 MED ORDER — ASPIRIN-DIPYRIDAMOLE ER 25-200 MG PO CP12: 1.0000 | ORAL_CAPSULE | Freq: Two times a day (BID) | ORAL | Status: DC

## 2011-12-18 MED ORDER — LOSARTAN POTASSIUM-HCTZ 100-25 MG PO TABS: 1.0000 | ORAL_TABLET | Freq: Every day | ORAL | Status: DC

## 2011-12-18 MED ORDER — DULOXETINE HCL 60 MG PO CPEP: 60.0000 mg | ORAL_CAPSULE | Freq: Every day | ORAL | Status: DC

## 2011-12-18 MED ORDER — OMEPRAZOLE 20 MG PO CPDR: 20.0000 mg | DELAYED_RELEASE_CAPSULE | Freq: Two times a day (BID) | ORAL | Status: DC

## 2011-12-18 NOTE — Telephone Encounter (Signed)
Pt is in [place] for 3 months helping an aunt and needs refills

## 2011-12-24 ENCOUNTER — Other Ambulatory Visit: Payer: Self-pay | Admitting: *Deleted

## 2011-12-24 DIAGNOSIS — E785 Hyperlipidemia, unspecified: Secondary | ICD-10-CM

## 2011-12-25 MED ORDER — SIMVASTATIN 20 MG PO TABS: 20.0000 mg | ORAL_TABLET | Freq: Every day | ORAL | Status: DC

## 2012-01-23 ENCOUNTER — Telehealth: Payer: Self-pay | Admitting: *Deleted

## 2012-01-23 NOTE — Telephone Encounter (Signed)
Pt called  - has been suffering with pain in her head. Percocet 10-325mg  is the same but pill is different and is not working. Has tried med most of Dec 2012. Discuss so many differ manufactures with pharmacy. Appt made to see if another med could be used. Pt wants old identity of Percocet. Stanton Kidney Brizza Nathanson RN 01/23/12 11AM

## 2012-01-28 ENCOUNTER — Ambulatory Visit (INDEPENDENT_AMBULATORY_CARE_PROVIDER_SITE_OTHER): Payer: Medicare Other | Admitting: Internal Medicine

## 2012-01-28 ENCOUNTER — Encounter: Payer: Self-pay | Admitting: Internal Medicine

## 2012-01-28 VITALS — BP 135/81 | HR 94 | Temp 97.6°F | Wt 175.0 lb

## 2012-01-28 DIAGNOSIS — Z139 Encounter for screening, unspecified: Secondary | ICD-10-CM

## 2012-01-28 DIAGNOSIS — Z23 Encounter for immunization: Secondary | ICD-10-CM

## 2012-01-28 DIAGNOSIS — E785 Hyperlipidemia, unspecified: Secondary | ICD-10-CM

## 2012-01-28 DIAGNOSIS — F17200 Nicotine dependence, unspecified, uncomplicated: Secondary | ICD-10-CM | POA: Insufficient documentation

## 2012-01-28 DIAGNOSIS — I10 Essential (primary) hypertension: Secondary | ICD-10-CM

## 2012-01-28 DIAGNOSIS — Z720 Tobacco use: Secondary | ICD-10-CM | POA: Insufficient documentation

## 2012-01-28 MED ORDER — LOSARTAN POTASSIUM-HCTZ 100-25 MG PO TABS: 1.0000 | ORAL_TABLET | Freq: Every day | ORAL | Status: DC

## 2012-01-28 MED ORDER — ZOSTER VACCINE LIVE 19400 UNT/0.65ML ~~LOC~~ SOLR: 0.6500 mL | Freq: Once | SUBCUTANEOUS | Status: DC

## 2012-01-28 NOTE — Assessment & Plan Note (Signed)
  Assessment: 1. The patient was counseled on the dangers of tobacco use, which include, but are not limited to cardiovascular disease, increased cancer risk of multiple types of cancer, COPD, peripheral vascular disease, strokes. 2. She was also counseled on the benefits of smoking cessation. 3. Progress toward smoking cessation:   Improved - she is down to 4 cig/day from prior 1 ppd 4. Barriers to progress toward smoking cessation:  smoking less  Plan: 1. Instruction/counseling given: The patient was firmly advised to quit and referred to a tobacco cessation program.   2. We also reviewed strategies to maximize success, including:  Removing cigarettes and smoking materials from environment  Stress management  Substitution of other forms of reinforcement Support of family/friends.  Selecting a quit date. 3. Educational resources provided: [facility] (1-800-QUIT-NOW) brochure (Simultaneous filing. User may not have seen previous data.) 4. Self management tools provided:   5. Medications to assist with smoking cessation: None 6. Patient agreed to the following self-care plans for smoking cessation:

## 2012-01-28 NOTE — Progress Notes (Signed)
Patient: Kara Mendoza   MRN: [id number]  DOB: 1950-11-08  PCP: Jonah Blue, DO   Subjective:    HPI: Kara Mendoza is a 61 y.o. female with a PMHx of CVA w/ residual LUE/LLE weakness as well as speech deficits in 2007, uses a cane, HTN, HL, OA, mild-intermittent asthma, and tobacco abuse who presented to clinic today for the following:  1) HTN - Patient does check blood pressure regularly at home - cannot recall the numbers. Currently taking Hyzaaris using her old bottle of BP med because she has had started having headaches more regularly again after changing manufacturers of her Percocet. Therefore, she is scared to try the new BP med from her new pharmacy. Patient misses doses 0 x per week on average. admits to headaches, otherwise denies dizziness, lightheadedness, chest pain, shortness of breath, new acute vision changes.  does request refills today - wants the RX from [facility] [facility] (the old manufacturer)   2) HLD - currently taking Zocor 20mg . Patient misses doses 0 x per week on average. denies chest pain, difficulty breathing, palpitations, tachycardia, and muscle pains. does not request refills today.     Review of Systems: Per HPI.   Current Outpatient Medications: Current Outpatient Prescriptions  Medication Sig Dispense Refill  . albuterol (PROAIR HFA) 108 (90 BASE) MCG/ACT inhaler Inhale 2 puffs into the lungs every 6 (six) hours as needed for wheezing.  1 Inhaler  5  . Diapers & Supplies MISC 1 Device by Does not apply route 4 (four) times daily as needed.  120 each  11  . dipyridamole-aspirin (AGGRENOX) 25-200 MG per 12 hr capsule Take 1 capsule by mouth 2 (two) times daily.  60 capsule  11  . DULoxetine (CYMBALTA) 60 MG capsule Take 1 capsule (60 mg total) by mouth daily.  90 capsule  1  . losartan-hydrochlorothiazide (HYZAAR) 100-25 MG per tablet Take 1 tablet by mouth daily.  90 tablet  1  . omeprazole (PRILOSEC) 20 MG capsule Take 1 capsule (20 mg  total) by mouth 2 (two) times daily.  180 capsule  1  . oxyCODONE-acetaminophen (PERCOCET) 10-325 MG per tablet Take 1 tablet by mouth every 4 (four) hours as needed.      . simvastatin (ZOCOR) 20 MG tablet Take 1 tablet (20 mg total) by mouth at bedtime.  30 tablet  3  . Multiple Vitamins-Calcium (ONE-A-DAY WOMENS FORMULA PO) Take 1 tablet by mouth daily.         Allergies  Allergen Reactions  . Penicillins     Past Medical History  Diagnosis Date  . Stroke   . Neuromuscular disorder   . Hypertension   . Hyperlipidemia   . Asthma     Past Surgical History  Procedure Date  . Cesarean section     x3     Objective:    Physical Exam: Filed Vitals:   01/28/12 1419  BP: 135/81  Pulse: 94  Temp: 97.6 F (36.4 C)     General Exam:   General: Vital signs reviewed and noted. Well-developed, well-nourished, in no acute distress; alert, appropriate and cooperative throughout examination.  Head: Normocephalic, atraumatic.  Eyes: No signs of anemia or jaundince. Pterygium noted on left medial eye.   Nose: Mucous membranes moist, not inflammed, nonerythematous.  Throat: Oropharynx nonerythematous, no exudate appreciated.   Lungs:  Normal respiratory effort. Clear to auscultation BL without crackles or wheezes.  Heart: RRR. S1 and S2 normal without gallop, murmur, or rubs.  Abdomen:  BS normoactive. Soft, Nondistended, non-tender.  Extremities: No pretibial edema.     Neurologic Exam:   Mental Status: Alert, oriented, thought content appropriate.  Speech aphasic.   Cranial Nerves:   II: Visual fields grossly intact.  III/IV/VI: Extraocular movements intact.  Pupils reactive bilaterally.  V/VII: Smile symmetric. facial light touch sensation normal bilaterally.  VIII: Grossly intact.  IX/X: Normal gag.  XI: Bilateral shoulder shrug normal.  XII: Midline tongue extension normal.  Motor:  5/5 right UE and LE with normal tone and bulk 4+/5 on left UE and LE  Sensory:   Light touch intact throughout, bilaterally    Assessment/ Plan:    Case and plan of care discussed with attending physician, Dr. Margarito Liner.

## 2012-01-28 NOTE — Addendum Note (Signed)
Addended by: Priscella Mann on: 01/28/2012 03:22 PM   Modules accepted: Orders

## 2012-01-28 NOTE — Assessment & Plan Note (Signed)
Pertinent Labs: Liver Function Tests:    Component Value Date/Time   AST 14 10/11/2011 1114   ALT 15 10/11/2011 1114   ALKPHOS 65 10/11/2011 1114   BILITOT 0.4 10/11/2011 1114   PROT 6.5 10/11/2011 1114   ALBUMIN 4.0 10/11/2011 1114    Lipid Panel:     Component Value Date/Time   CHOL 134 10/11/2011 1114   TRIG 75 10/11/2011 1114   HDL 44 10/11/2011 1114   CHOLHDL 3.0 10/11/2011 1114   VLDL 15 10/11/2011 1114   LDLCALC 75 10/11/2011 1114     Assessment: Goal LDL (per ATP guidelines): < 100 mg/dL  Disease Control: controlled  Progress toward goals: at goal  Barriers to meeting goals: no barriers identified    Patient is compliant all of the time with prescribed medications.    Plan:  continue current medications

## 2012-01-28 NOTE — Patient Instructions (Addendum)
General Instructions:  Please follow-up at the clinic in 3 months WITH YOUR PCP, at which time we will reevaluate your depression, blood pressure, cholesterol - OR, please follow-up in the clinic sooner if needed.  There have not been changes in your medications  Please get the new prescription for your losartan-hydrochlorothiazide filled at your pharmacy.    Please get the shingles shot completed at your pharmacy.  Please see your pain management doctor regarding the change in your pain medications.  If symptoms worsen, or new symptoms arise, please call the clinic or go to the ER.  PLEASE BRING ALL OF YOUR MEDICATIONS  IN A BAG TO YOUR NEXT APPOINTMENT   Treatment Goals:  Goals (1 Years of Data) as of 01/28/2012          As of Today 11/07/11 10/11/11 05/17/11 02/09/11     Blood Pressure    . Blood Pressure < 140/90  135/81 126/72 118/69 130/79 139/75      Progress Toward Treatment Goals:  Treatment Goal 01/28/2012  Blood pressure at goal  Stop smoking smoking less    LIFESTYLE TIPS TO HELP WITH YOUR BLOOD PRESSURE CONTROL  WEIGHT REDUCTION:  Strategies: A healthy weight loss program includes:  A calorie restricted diet based on individual calorie needs.   Increased physical activity (exercise).  An exercise program is just as important as the right low-calorie diet.    An unhealthy weight loss program includes:  Fasting.   Fad diets.   Supplements and drugs.  These choices do not succeed in long-term weight control.   Home Care Instructions: To help you make the needed dietary changes:   Exercise and perform physical activity as directed by your caregiver.   Keep a daily record of everything you eat. There are many free websites to help you with this. It may be helpful to measure your foods so you can determine if you are eating the correct portion sizes.   Use low-calorie cookbooks or take special cooking classes.   Avoid alcohol. Drink more water and  drinks with no calories.   Take vitamins and supplements only as recommended by your caregiver.   Weight loss support groups, Registered Dieticians, counselors, and stress reduction education can also be very helpful.   ________________________________________________________________________  DASH DIET:  The DASH diet stands for "Dietary Approaches to Stop Hypertension." It is a healthy eating plan that has been shown to reduce high blood pressure (hypertension) in as little as 14 days, while also possibly providing other significant health benefits. These other health benefits include reducing the risk of breast cancer after menopause and reducing the risk of type 2 diabetes, heart disease, colon cancer, and stroke. Health benefits also include weight loss and slowing kidney failure in patients with chronic kidney disease.   Diet guidelines: Limit salt (sodium). Your diet should contain less than 1500 mg of sodium daily.  Limit refined or processed carbohydrates. Your diet should include mostly whole grains. Desserts and added sugars should be used sparingly.  Include small amounts of heart-healthy fats. These types of fats include nuts, oils, and tub margarine. Limit saturated and trans fats. These fats have been shown to be harmful in the body.   Choosing Foods: The following food groups are based on a 2000 calorie diet. See your Registered Dietitian for individual calorie needs.  Grains and Grain Products (6 to 8 servings daily)  Eat More Often: Whole-wheat bread, brown rice, whole-grain or wheat pasta, quinoa, popcorn without added fat or  salt (air popped).  Eat Less Often: White bread, white pasta, white rice, cornbread.  Vegetables (4 to 5 servings daily)  Eat More Often: Fresh, frozen, and canned vegetables. Vegetables may be raw, steamed, roasted, or grilled with a minimal amount of fat.  Eat Less Often/Avoid: Creamed or fried vegetables. Vegetables in a cheese sauce.  Fruit (4 to 5  servings daily)  Eat More Often: All fresh, canned (in natural juice), or frozen fruits. Dried fruits without added sugar. One hundred percent fruit juice ( cup [237 mL] daily).  Eat Less Often: Dried fruits with added sugar. Canned fruit in light or heavy syrup.  [employer], Fish, and Poultry (2 servings or less daily. One serving is 3 to 4 oz [85-114 g]).  Eat More Often: Ninety percent or leaner ground beef, tenderloin, sirloin. Round cuts of beef, chicken breast, [place] breast. All fish. Grill, bake, or broil your meat. Nothing should be fried.  Eat Less Often/Avoid: Fatty cuts of meat, [place], or chicken leg, thigh, or wing. Fried cuts of meat or fish.  Dairy (2 to 3 servings)  Eat More Often: Low-fat or fat-free milk, low-fat plain or light yogurt, reduced-fat or part-skim cheese.  Eat Less Often/Avoid: Milk (whole, 2%, skim, or chocolate). Whole milk yogurt. Full-fat cheeses.  Nuts, Seeds, and Legumes (4 to 5 servings per week)  Eat More Often: All without added salt.  Eat Less Often/Avoid: Salted nuts and seeds, canned beans with added salt.  Fats and Sweets (limited)  Eat More Often: Vegetable oils, tub margarines without trans fats, sugar-free gelatin. Mayonnaise and salad dressings.  Eat Less Often/Avoid: Coconut oils, palm oils, butter, stick margarine, cream, half and half, cookies, candy, pie.   ________________________________________________________________________  Smoking Cessation Tips 1-800-QUIT-NOW  This document explains the best ways for you to quit smoking and new treatments to help. It lists new medicines that can double or triple your chances of quitting and quitting for good. It also considers ways to avoid relapses and concerns you may have about quitting, including weight gain.   Nicotine: A Powerful Addiction If you have tried to quit smoking, you know how hard it can be. It is hard because nicotine is a very addictive drug. For some people, it can be as  addictive as heroin or cocaine. Usually, people make 2 or 3 tries, or more, before finally being able to quit. Each time you try to quit, you can learn about what helps and what hurts. Quitting takes hard work and a lot of effort, but you can quit smoking.   Quitting smoking is one of the most important things you will ever do You will live longer, feel better, and live better.  The impact on your body of quitting smoking is felt almost immediately:   Five keys to quitting: Studies have shown that these 5 steps will help you quit smoking and quit for good. You have the best chances of quitting if you use them together:   1. GET READY  Set a quit date.  Change your environment.  Get rid of ALL cigarettes, ashtrays, matches, and lighters in your home, car, and place of work.  Do not let people smoke in your home.  Review your past attempts to quit. Think about what worked and what did not.  Once you quit, do not smoke. NOT EVEN A PUFF!   2. GET SUPPORT AND ENCOURAGEMENT  Tell your family, friends, and coworkers that you are going to quit and need their support.  Ask them not to smoke around you.  Get individual, group, or telephone counseling and support.  Many smokers find one or more of the many self-help books available useful in helping them quit and stay off tobacco.   3. LEARN NEW SKILLS AND BEHAVIORS  Try to distract yourself from urges to smoke. Talk to someone, go for a walk, or occupy your time with a task.  When you first try to quit, change your routine. Take a different route to work. Drink tea instead of coffee. Eat breakfast in a different place.  Do something to reduce your stress. Take a hot bath, exercise, or read a book.  Plan something enjoyable to do every day. Reward yourself for not smoking.  Explore interactive web-based programs that specialize in helping you quit.   4. GET MEDICINE AND USE IT CORRECTLY .  Medicines can help you stop smoking and decrease the urge  to smoke. Combining medicine with the above behavioral methods and support can quadruple your chances of successfully quitting smoking.  Talk with your doctor about these options.  5. BE PREPARED FOR RELAPSE OR DIFFICULT SITUATIONS  Most relapses occur within the first 3 months after quitting. Do not be discouraged if you start smoking again. Remember, most people try several times before they finally quit.  You may have symptoms of withdrawal because your body is used to nicotine. You may crave cigarettes, be irritable, feel very hungry, cough often, get headaches, or have difficulty concentrating.  The withdrawal symptoms are only temporary. They are strongest when you first quit, but they will go away within 10 to 14 days.   Quitting takes hard work and a lot of effort, but you can quit smoking.   FOR MORE INFORMATION  Smokefree.gov (http://www.davis-sullivan.com/) provides free, accurate, evidence-based information and professional assistance to help support the immediate and long-term needs of people trying to quit smoking.  Document Released: 01/16/2001 Document Re-Released: 07/12/2009  [facility] Patient Information 2011 [place], [place].

## 2012-01-28 NOTE — Assessment & Plan Note (Signed)
Health Maintenance  Topic Date Due  . Mammogram  07/24/2013  . Pap Smear  11/07/2014  . Tetanus/tdap  06/25/2018  . Colonoscopy  12/31/2020  . Influenza Vaccine  10/07/2011  . Zostavax  Addressed    Assessment:  Procedures due: colonoscopy  Labs due: None  Immunizations due: flu shot, Zostavax  Plan:  Refuses flu shot  RX for Zostavax given to pt.

## 2012-01-28 NOTE — Assessment & Plan Note (Signed)
Pertinent Data: BP Readings from Last 3 Encounters:  01/28/12 135/81  11/07/11 126/72  10/11/11 118/69    Basic Metabolic Panel:    Component Value Date/Time   NA 140 10/11/2011 1114   K 4.7 10/11/2011 1114   CL 104 10/11/2011 1114   CO2 30 10/11/2011 1114   BUN 14 10/11/2011 1114   CREATININE 0.85 10/11/2011 1114   CREATININE .8 04/20/2008 1604   GLUCOSE 96 10/11/2011 1114   CALCIUM 9.9 10/11/2011 1114    Assessment: Disease Control: controlled  Progress toward goals: at goal  Barriers to meeting goals: no barriers identified    Patient prefers to get new RX for same manufacturer previously prescribed because of recent SE of her pain medications after the manufacturer was changed --> so she is afraid to take new BP meds as well.  Patient is compliant most of the time with prescribed medications.   Continues to smoke.  Plan:  continue current medications  BUT, prescription was written for postpartum-Hydrocort thiazide 100-25 mg daily #90, refill 1 manufacturer [facility] only  Patient encouraged to pursue smoking cessation, and handout provided  Educational resources provided: brochure  Self management tools provided: home blood pressure logbook

## 2012-01-29 ENCOUNTER — Emergency Department (HOSPITAL_COMMUNITY)
Admission: EM | Admit: 2012-01-29 | Discharge: 2012-01-30 | Discharge status: Against Medical Advice | Payer: Medicare Other | Attending: Emergency Medicine | Admitting: Emergency Medicine

## 2012-01-29 DIAGNOSIS — J45909 Unspecified asthma, uncomplicated: Secondary | ICD-10-CM | POA: Insufficient documentation

## 2012-01-29 DIAGNOSIS — I10 Essential (primary) hypertension: Secondary | ICD-10-CM | POA: Insufficient documentation

## 2012-01-29 DIAGNOSIS — F1120 Opioid dependence, uncomplicated: Secondary | ICD-10-CM

## 2012-01-29 DIAGNOSIS — E785 Hyperlipidemia, unspecified: Secondary | ICD-10-CM | POA: Insufficient documentation

## 2012-01-29 DIAGNOSIS — Z7982 Long term (current) use of aspirin: Secondary | ICD-10-CM | POA: Insufficient documentation

## 2012-01-29 DIAGNOSIS — R51 Headache: Secondary | ICD-10-CM

## 2012-01-29 DIAGNOSIS — Z8673 Personal history of transient ischemic attack (TIA), and cerebral infarction without residual deficits: Secondary | ICD-10-CM | POA: Insufficient documentation

## 2012-01-29 DIAGNOSIS — Z79899 Other long term (current) drug therapy: Secondary | ICD-10-CM | POA: Insufficient documentation

## 2012-01-29 DIAGNOSIS — Z8669 Personal history of other diseases of the nervous system and sense organs: Secondary | ICD-10-CM | POA: Insufficient documentation

## 2012-01-29 DIAGNOSIS — F17200 Nicotine dependence, unspecified, uncomplicated: Secondary | ICD-10-CM | POA: Insufficient documentation

## 2012-01-29 DIAGNOSIS — F1920 Other psychoactive substance dependence, uncomplicated: Secondary | ICD-10-CM | POA: Insufficient documentation

## 2012-01-30 ENCOUNTER — Encounter (HOSPITAL_COMMUNITY): Payer: Self-pay | Admitting: *Deleted

## 2012-01-30 MED ORDER — DIPHENHYDRAMINE HCL 50 MG/ML IJ SOLN: 50.0000 mg | Freq: Once | INTRAMUSCULAR | Status: DC

## 2012-01-30 MED ORDER — SODIUM CHLORIDE 0.9 % IV BOLUS (SEPSIS): 1000.0000 mL | Freq: Once | INTRAVENOUS | Status: DC

## 2012-01-30 MED ORDER — METOCLOPRAMIDE HCL 5 MG/ML IJ SOLN: 10.0000 mg | Freq: Once | INTRAMUSCULAR | Status: DC

## 2012-01-30 NOTE — ED Provider Notes (Signed)
History     CSN: [id number]  Arrival date & time 01/29/12  2355   First MD Initiated Contact with Patient 01/30/12 0016      Chief Complaint  Patient presents with  . Headache    (Consider location/radiation/quality/duration/timing/severity/associated sxs/prior treatment) HPI  Patient states she had a stroke in 2007 and since then her brain has been painful. She takes she takes oxycodone for that pain. She's followed at a pain management center. She states she normally takes oxycodone 10/325. She states she normally takes the "[place] brand" however when she had her medicine filled on November 26 the pill was shaped differently and was from a different company. She states that medicine is not helping her pain. She states she threw it away. She was seen yesterday by her PCP and her pain management. She has a prescription written December 23 for #150 oxycodone 10/325 to be filled at a specific [facility]. She states the pharmacy no longer has the "[place] brand" Percocet which is the only Percocet that she wants. She states she is having sharp pains in her head. They are constant for the past month. She denies nausea, vomiting or photophobia.  [facility]   Past Medical History  Diagnosis Date  . Stroke   . Neuromuscular disorder   . Hypertension   . Hyperlipidemia   . Asthma     Past Surgical History  Procedure Date  . Cesarean section     x3    Family History  Problem Relation Age of Onset  . Diabetes Sister     History  Substance Use Topics  . Smoking status: Current Every Day Smoker -- 0.2 packs/day    Types: Cigarettes  . Smokeless tobacco: Never Used     Comment: Tryin to quit  . Alcohol Use: No    OB History    Grav Para Term Preterm Abortions TAB SAB Ect Mult Living                  Review of Systems  All other systems reviewed and are negative.    Allergies  Penicillins  Home Medications   Current Outpatient Rx  Name  Route  Sig   Dispense  Refill  . ALBUTEROL SULFATE HFA 108 (90 BASE) MCG/ACT IN AERS   Inhalation   Inhale 2 puffs into the lungs every 6 (six) hours as needed for wheezing.   1 Inhaler   5   . DIAPERS & SUPPLIES MISC   Does not apply   1 Device by Does not apply route 4 (four) times daily as needed.   120 each   11   . ASPIRIN-DIPYRIDAMOLE ER 25-200 MG PO CP12   Oral   Take 1 capsule by mouth 2 (two) times daily.   60 capsule   11   . DULOXETINE HCL 60 MG PO CPEP   Oral   Take 1 capsule (60 mg total) by mouth daily.   90 capsule   1   . LOSARTAN POTASSIUM-HCTZ 100-25 MG PO TABS   Oral   Take 1 tablet by mouth daily.   90 tablet   1     [employer] MANU[employer]CTUR[employer] ONLY.   Marland Kitchen ONE-A-DAY WOMENS FORMULA PO   Oral   Take 1 tablet by mouth daily.         Marland Kitchen OMEPRAZOLE 20 MG PO CPDR   Oral   Take 1 capsule (20 mg total) by mouth 2 (two) times daily.  180 capsule   1   . OXYCODONE-ACETAMINOPHEN 10-325 MG PO TABS   Oral   Take 1 tablet by mouth every 4 (four) hours as needed.         Marland Kitchen SIMVASTATIN 20 MG PO TABS   Oral   Take 1 tablet (20 mg total) by mouth at bedtime.   30 tablet   3   . ZOSTER VACCINE LIVE 19400 UNT/0.65ML [place] SOLR   Subcutaneous   Inject 19,400 Units into the skin once.   1 each   0     PLEASE FAX VACCINATION CONFIRMATION RECORD TO 832- ...     BP 151/92  Pulse 98  Temp 98.5 F (36.9 C) (Oral)  Resp 20  SpO2 98%  Physical Exam  Nursing note and vitals reviewed. Constitutional: She is oriented to person, place, and time. She appears well-developed and well-nourished.  Non-toxic appearance. She does not appear ill. She appears distressed.       Patient gets tearful  HENT:  Head: Normocephalic and atraumatic.  Right Ear: External ear normal.  Left Ear: External ear normal.  Nose: Nose normal. No mucosal edema or rhinorrhea.  Mouth/Throat: Oropharynx is clear and moist and mucous membranes are normal. No dental abscesses or uvula  swelling.  Eyes: Conjunctivae normal and EOM are normal. Pupils are equal, round, and reactive to light.  Neck: Normal range of motion and full passive range of motion without pain. Neck supple.  Cardiovascular: Normal rate, regular rhythm and normal heart sounds.  Exam reveals no gallop and no friction rub.   No murmur heard. Pulmonary/Chest: Effort normal and breath sounds normal. No respiratory distress. She has no wheezes. She has no rhonchi. She has no rales. She exhibits no tenderness and no crepitus.  Abdominal: Soft. Normal appearance and bowel sounds are normal. She exhibits no distension. There is no tenderness. There is no rebound and no guarding.  Musculoskeletal: Normal range of motion. She exhibits no edema and no tenderness.       Moves all extremities well.   Neurological: She is alert and oriented to person, place, and time. She has normal strength. No cranial nerve deficit.  Skin: Skin is warm, dry and intact. No rash noted. No erythema. No pallor.  Psychiatric: She has a normal mood and affect. Her speech is normal and behavior is normal. Her mood appears not anxious.    ED Course  Procedures (including critical care time)    Medications  sodium chloride 0.9 % bolus 1,000 mL (not administered)  metoCLOPramide (REGLAN) injection 10 mg (not administered)  diphenhydrAMINE (BENADRYL) injection 50 mg (not administered)   Patient refused the headache cocktail. I am not sure what she wants me to do. Her pharmacy does not have the specific brand of oxycodone that she wants. She states her pain management doctor told her to get that specific pain medicine, however when I look at her prescription there is no specific brand indicated.   1. Headache   2. Narcotic addiction     Pt left AMA   Devoria Albe, MD, FACEP   MDM          Ward Givens, MD 01/30/12 [phone]

## 2012-01-30 NOTE — ED Notes (Signed)
Pt states her head has hurt all this month says she was seen yesterday at [facility] and they told her to take same medication she has been taking,

## 2012-02-01 ENCOUNTER — Telehealth: Payer: Self-pay | Admitting: *Deleted

## 2012-02-01 NOTE — Telephone Encounter (Signed)
Pt called hysterical concerning her percocet, that is not written here in clinic but from dr dakwa, i finally called his office, made pt an appt and calmed her down, she will keep her appt

## 2012-02-01 NOTE — Telephone Encounter (Signed)
Thank you :)

## 2012-03-10 ENCOUNTER — Ambulatory Visit (INDEPENDENT_AMBULATORY_CARE_PROVIDER_SITE_OTHER): Payer: Medicare Other | Admitting: Internal Medicine

## 2012-03-10 ENCOUNTER — Encounter: Payer: Self-pay | Admitting: *Deleted

## 2012-03-10 ENCOUNTER — Encounter: Payer: Self-pay | Admitting: Internal Medicine

## 2012-03-10 ENCOUNTER — Ambulatory Visit (HOSPITAL_COMMUNITY)
Admission: RE | Admit: 2012-03-10 | Discharge: 2012-03-10 | Disposition: A | Discharge status: Home / Self Care | Payer: Medicare Other | Source: Ambulatory Visit | Attending: Internal Medicine | Admitting: Internal Medicine

## 2012-03-10 VITALS — BP 115/76 | HR 104 | Temp 97.9°F | Ht 62.0 in | Wt 177.3 lb

## 2012-03-10 DIAGNOSIS — R059 Cough, unspecified: Secondary | ICD-10-CM

## 2012-03-10 DIAGNOSIS — M47814 Spondylosis without myelopathy or radiculopathy, thoracic region: Secondary | ICD-10-CM | POA: Insufficient documentation

## 2012-03-10 DIAGNOSIS — I6350 Cerebral infarction due to unspecified occlusion or stenosis of unspecified cerebral artery: Secondary | ICD-10-CM

## 2012-03-10 DIAGNOSIS — R053 Chronic cough: Secondary | ICD-10-CM

## 2012-03-10 DIAGNOSIS — J189 Pneumonia, unspecified organism: Secondary | ICD-10-CM | POA: Insufficient documentation

## 2012-03-10 DIAGNOSIS — Z8673 Personal history of transient ischemic attack (TIA), and cerebral infarction without residual deficits: Secondary | ICD-10-CM

## 2012-03-10 DIAGNOSIS — I639 Cerebral infarction, unspecified: Secondary | ICD-10-CM

## 2012-03-10 DIAGNOSIS — R197 Diarrhea, unspecified: Secondary | ICD-10-CM | POA: Insufficient documentation

## 2012-03-10 DIAGNOSIS — R05 Cough: Secondary | ICD-10-CM

## 2012-03-10 DIAGNOSIS — R52 Pain, unspecified: Secondary | ICD-10-CM | POA: Insufficient documentation

## 2012-03-10 LAB — BASIC METABOLIC PANEL WITH GFR
BUN: 13 mg/dL (ref 6–23)
CO2: 30 mEq/L (ref 19–32)
Calcium: 10.3 mg/dL (ref 8.4–10.5)
Chloride: 101 mEq/L (ref 96–112)
Creat: 0.81 mg/dL (ref 0.50–1.10)
GFR, Est African American: 89 mL/min
GFR, Est Non African American: 78 mL/min
Glucose, Bld: 135 mg/dL — ABNORMAL HIGH (ref 70–99)
Potassium: 3.4 mEq/L — ABNORMAL LOW (ref 3.5–5.3)
Sodium: 140 mEq/L (ref 135–145)

## 2012-03-10 LAB — CBC WITH DIFFERENTIAL/PLATELET
Basophils Absolute: 0 10*3/uL (ref 0.0–0.1)
Basophils Relative: 0 % (ref 0–1)
Eosinophils Absolute: 0.1 10*3/uL (ref 0.0–0.7)
Eosinophils Relative: 1 % (ref 0–5)
HCT: 42.2 % (ref 36.0–46.0)
Hemoglobin: 14.4 g/dL (ref 12.0–15.0)
Lymphocytes Relative: 21 % (ref 12–46)
Lymphs Abs: 2.5 10*3/uL (ref 0.7–4.0)
MCH: 30.3 pg (ref 26.0–34.0)
MCHC: 34.1 g/dL (ref 30.0–36.0)
MCV: 88.7 fL (ref 78.0–100.0)
Monocytes Absolute: 0.8 10*3/uL (ref 0.1–1.0)
Monocytes Relative: 7 % (ref 3–12)
Neutro Abs: 8.5 10*3/uL — ABNORMAL HIGH (ref 1.7–7.7)
Neutrophils Relative %: 71 % (ref 43–77)
Platelets: 404 10*3/uL — ABNORMAL HIGH (ref 150–400)
RBC: 4.76 MIL/uL (ref 3.87–5.11)
RDW: 12 % (ref 11.5–15.5)
WBC: 11.9 10*3/uL — ABNORMAL HIGH (ref 4.0–10.5)

## 2012-03-10 MED ORDER — POTASSIUM CHLORIDE ER 10 MEQ PO TBCR: 20.0000 meq | EXTENDED_RELEASE_TABLET | Freq: Two times a day (BID) | ORAL | Status: DC

## 2012-03-10 MED ORDER — METHYLPREDNISOLONE SODIUM SUCC 125 MG IJ SOLR
125.0000 mg | Freq: Once | INTRAMUSCULAR | Status: AC
  Administered 2012-03-10: 125 mg via INTRAMUSCULAR

## 2012-03-10 MED ORDER — ALBUTEROL SULFATE (2.5 MG/3ML) 0.083% IN NEBU: 2.5000 mg | INHALATION_SOLUTION | Freq: Once | RESPIRATORY_TRACT | Status: DC

## 2012-03-10 MED ORDER — SODIUM CHLORIDE 0.9 % IV SOLN: 125.0000 mg | Freq: Once | INTRAVENOUS | Status: DC

## 2012-03-10 MED ORDER — ALBUTEROL SULFATE HFA 108 (90 BASE) MCG/ACT IN AERS: 2.0000 | INHALATION_SPRAY | Freq: Four times a day (QID) | RESPIRATORY_TRACT | Status: DC | PRN

## 2012-03-10 MED ORDER — PREDNISONE (PAK) 10 MG PO TABS: 20.0000 mg | ORAL_TABLET | Freq: Every day | ORAL | Status: DC

## 2012-03-10 MED ORDER — ALBUTEROL SULFATE (2.5 MG/3ML) 0.083% IN NEBU
2.5000 mg | INHALATION_SOLUTION | Freq: Once | RESPIRATORY_TRACT | Status: AC
  Administered 2012-03-10: 2.5 mg via RESPIRATORY_TRACT

## 2012-03-10 MED ORDER — METHYLPREDNISOLONE SODIUM SUCC 125 MG IJ SOLR: 125.0000 mg | Freq: Once | INTRAMUSCULAR | Status: DC

## 2012-03-10 MED ORDER — ONDANSETRON HCL 4 MG PO TABS: 4.0000 mg | ORAL_TABLET | Freq: Every day | ORAL | Status: DC | PRN

## 2012-03-10 MED ORDER — LEVOFLOXACIN 750 MG PO TABS: 750.0000 mg | ORAL_TABLET | Freq: Every day | ORAL | Status: AC

## 2012-03-10 NOTE — Progress Notes (Signed)
Agree 

## 2012-03-10 NOTE — Patient Instructions (Addendum)
General Instructions: -You need to stop smoking -Use your albuterol inhaler as needed for wheezing -Take all your medications are prescribed -Please return if your cough worsens, if you have persistent fever. You need to see you back either Thursday or Friday, we will call you with an appointment.    Treatment Goals:  Goals (1 Years of Data) as of 03/10/2012          As of Today 01/30/12 01/28/12 11/07/11 10/11/11     Blood Pressure    . Blood Pressure < 140/90  115/76 151/92 135/81 126/72 118/69     Lifestyle    . Quit smoking / using tobacco  No  No       Result Component    . LDL CALC < 100      75      Progress Toward Treatment Goals:  Treatment Goal 01/28/2012  Blood pressure at goal  Stop smoking smoking less    Self Care Goals & Plans:  Self Care Goal 03/10/2012  Manage my medications take my medicines as prescribed; bring my medications to every visit; refill my medications on time; follow the sick day instructions if I am sick  Monitor my health keep track of my blood pressure; keep track of my weight  Eat healthy foods eat foods that are low in salt; eat smaller portions  Be physically active take a walk every day; find an activity I enjoy

## 2012-03-10 NOTE — Assessment & Plan Note (Addendum)
CXR 2 view STAT, showing new left midlung opacity could represent pneumonia or malignancy. Recommend follow-up chest radiography in 4-6  weeks time, or chest CT, for further characterization.  She has mild leukocytosis with WBC 11.9. However, she is afebrile, Her CURB 65 is zero and she is able to tolerate intake per mouth. Her O2 saturation is 98% on RA and 94% with ambulation. She has transient expiratory wheezing that is cleared by coughing but she has SOB--asthma exacerbation less likely. At this time she does not meet criteria for inpatient treatment of her pneumonia and will be treated in the outpatient setting.  -CBC with diff STAT showing WBC per above -BMET STAT showing stable Cr and BUN but low K of 3.4 (likely due to albuterol use and decrease intake per mouth) Treatment Plan:  -Albuterol Nebulizer treatment once -Solumedrol 125mg  IM once at this visit -Levaquin 750mg  daily for 5 days -Prednisone 20mg  x 5 days -Albuterol inhaler opc sample -Follow up in clinic in 4 days or sooner if symptoms persist -K-Dur daily x 5 days -Zofran 4mg  PRN for nausea -Encouraged PO hydration -Pt instructed to call us if her symptoms worsen or go to the ED if the clinic is close, she agreed and understood.   -Scheduled repeat CXR for 4-6 weeks

## 2012-03-10 NOTE — Progress Notes (Unsigned)
Pt presents c/o for 1 month, very congested cough, weak, hoarse, tired, cough productive w/ yellow, dark green thick mucous. Denies fever. appt given for 1530 dr Garald Braver per doriss.

## 2012-03-10 NOTE — Assessment & Plan Note (Signed)
Unclear etiology. She reports diarrhea off and on for a month. She denies recent antibiotic, blood in her stools, or use or abdominal cramps. The diarrhea subsideded yesterday.  -Continue monitoring  -Imodium as needed.

## 2012-03-10 NOTE — Progress Notes (Signed)
  Subjective:    Patient ID: Kara Mendoza, female    DOB: Apr 13, 1950, 62 y.o.   MRN: [id number]  HPI Kara Mendoza is a 62 year old woman with PMH of asthma, chronic headache, fatigue, tobacco use who comes in for evaluation of cough persistent for one month. She explains that the cough has progressively worsened, being nonproductive at first but now productive of yellow/green sputum. She has coughing spells at time. She also reports generalized body aches, SOB,rhinorrhea, headache, sinus pressure, decreased appetite with nausea, subjective fever that comes and goes, and nonbloody diarrhea with liquid stools off and on with last episode yesterday. She states that her son is sick with a cough but not as sick as her. She always declines the flu vaccine and has not had it during this flu season.  She has tried Robitussin, Tussin Q, and Dayquil with no improvement of her symptoms.   She also requests prescription for Percocet today but she is followed by the Pain [facility] and understands that the [facility] will not prescribe narcotics to her at this time.    Review of Systems  Constitutional: Positive for fever, chills, diaphoresis, appetite change and fatigue.  HENT: Positive for rhinorrhea and sinus pressure.   Respiratory: Positive for cough, chest tightness, shortness of breath and wheezing.   Cardiovascular: Negative for chest pain.  Gastrointestinal: Positive for nausea and diarrhea. Negative for vomiting and blood in stool.  Genitourinary: Negative for dysuria, frequency and difficulty urinating.  Neurological: Positive for dizziness and headaches.  Hematological: Negative for adenopathy.  Psychiatric/Behavioral: Negative for confusion and agitation.       Objective:   Physical Exam  Nursing note and vitals reviewed. Constitutional: She is oriented to person, place, and time. No distress.       Pt became diaphoretic during exam but this subsided on its own with repeat temperature measured at  97.53F.   Eyes: No scleral icterus.  Neck: Neck supple.  Cardiovascular: Normal rate and regular rhythm.   Pulmonary/Chest: Effort normal. No stridor. No respiratory distress. She has wheezes.       Transient expiratory wheezes that are cleared after coughing.   Abdominal: Soft. Bowel sounds are normal. She exhibits no distension. There is no tenderness.  Musculoskeletal: She exhibits no edema.  Lymphadenopathy:    She has no cervical adenopathy.  Neurological: She is alert and oriented to person, place, and time. Coordination normal.  Skin: Skin is warm. No rash noted. She is diaphoretic.  Psychiatric: She has a normal mood and affect.          Assessment & Plan:

## 2012-03-10 NOTE — Progress Notes (Signed)
4:05PM temp rechecked axillary ( pt eating ice and oral no reading ) 97.8 and PSO2 95% and cont with 95% while walking till end of walk 94% - all room air. Stanton Kidney Suzanne Garbers RN 03/10/12 4:05PM

## 2012-03-11 NOTE — Assessment & Plan Note (Signed)
She states having nurse aide in the past but this service has been discontinued. She needs help with meal preparation, laundry.  -Referral to the [facility] CSW.

## 2012-03-14 ENCOUNTER — Telehealth: Payer: Self-pay | Admitting: Internal Medicine

## 2012-03-14 ENCOUNTER — Ambulatory Visit: Payer: Medicare Other | Admitting: Internal Medicine

## 2012-03-14 NOTE — Telephone Encounter (Signed)
I called Kara Mendoza to verify she was taking her antibiotic. She reports that they delivered it to her last night. She has not taken the prednisone yet but I instructed her to take it while she is still taking the antibiotic to help relieve her shortness of breath. She states that she is still coughing yellow phlegm but she is able to eat soups and drink water. She was unable to come to her appointment this morning as she was feeling too tired. I advised her to call us on Monday to schedule an appointment if her symptoms have not improved or to go to the ED if her symptoms worsen. She agreed and understood.

## 2012-03-18 ENCOUNTER — Telehealth: Payer: Self-pay | Admitting: Licensed Clinical Social Worker

## 2012-03-18 NOTE — Telephone Encounter (Signed)
Ms. Paternostro was referred to CSW to initiate PCS request.  CSW placed call to [facility] to determine pt's current status.  [employer] shows pt a current with services, unable to provide name of agency to CSW.  CSW placed call to Ms. Roach and informed pt of need to call [employer] and switch providers, per [employer].  Pt states she does not know who to switch.  CSW inquired if pt received listing of agencies CSW had sent.  Pt states yes, but has not made telephone calls to inquire about agency.  CSW offered pt several larger agencies utilized by other [facility] patients.  Pt declines [facility] [employer].  CSW provided Ms. Chenette with number to [employer], [facility] and [facility].  Pt repeated phone numbers.  CSW informed Ms. Crosley to call CSW if there is an issue after calling [employer], as there is nothing more physician can do at this time.

## 2012-03-24 ENCOUNTER — Telehealth: Payer: Self-pay | Admitting: *Deleted

## 2012-03-24 NOTE — Telephone Encounter (Signed)
Annice Pih calls and states she needs paperwork signed before Thursday, she is faxing the paperwork to me and i will give to shanag.

## 2012-03-24 NOTE — Telephone Encounter (Signed)
What kind of paperwork?

## 2012-03-25 ENCOUNTER — Other Ambulatory Visit: Payer: Self-pay | Admitting: Internal Medicine

## 2012-03-25 ENCOUNTER — Telehealth: Payer: Self-pay | Admitting: *Deleted

## 2012-03-25 MED ORDER — GUAIFENESIN-CODEINE 100-10 MG/5ML PO SYRP: 5.0000 mL | ORAL_SOLUTION | Freq: Three times a day (TID) | ORAL | Status: DC | PRN

## 2012-03-25 NOTE — Telephone Encounter (Signed)
This is done per shanag.

## 2012-03-25 NOTE — Telephone Encounter (Signed)
Called Kara Mendoza to discuss her complaints. She states Dr. Nilsa Nutting of Pain management recently changed her pain medication.  Search of the [place] narcotic database shows she receive 120 tablets of Vicodin on 03/11/12. She was previously on Percocet last dispensed #150 on 01/01/12. She states this is not helping her pain and her headaches are getting worse on this. She states she feels her respiratory symptoms are getting better after being treated in our clinic, but now is having a cough that is worsening her headache. I offered to call in Robitussin [facility] to her pharmacy, which she agreed. I advised her to not take Vicodin within 24 hours of taking this cough suppressant, since it has codeine, also a narcotic that can cause respiratory suppression. She states she will not take any narcotic while she is using this cough suppressant, which I agree with.  I tried calling Dr. Charlann Boxer office at [phone] without success (busy line on multiple attempts) to clarify why she was changed from Percocet to Vicodin. She is agreeable to being evaluated in clinic on Thursday.

## 2012-03-25 NOTE — Telephone Encounter (Signed)
Pt calls and states that she does not feel her pneumonia has improved and desires to be seen Thursday 2/20 for that problem, she is offered a wed appt but states she needs to give transportation notice, she is scheduled for thurs, she states that dr Kem Kays is suppose to be available to see her when she needs to see him, she is informed his schedule is full until 3/27, she then states she needs to see him for her pain med to be changed, that she was told that the reason she got pneumonia is that the pharmacy changed the brand of oxycodone and it caused a lack of oxygen in her brain because her brain was accustomed to a certain brand and she got pneumonia from this??? She states the pain management md will not change her pharmacy to one that carries the brand she needs so the md that told her that she got pneumonia from this change told her to see her pcp to change it and he would. She would like for dr paya to call her at home and discuss this because he is suppose to be available at all times.

## 2012-03-25 NOTE — Telephone Encounter (Signed)
Cough syrup called to [facility] [address]

## 2012-03-25 NOTE — Telephone Encounter (Signed)
I will see her on Thursday! Thanks, SK

## 2012-03-27 ENCOUNTER — Ambulatory Visit (INDEPENDENT_AMBULATORY_CARE_PROVIDER_SITE_OTHER): Payer: Medicare Other | Admitting: Internal Medicine

## 2012-03-27 ENCOUNTER — Encounter: Payer: Self-pay | Admitting: Internal Medicine

## 2012-03-27 VITALS — BP 113/72 | HR 104 | Temp 99.2°F | Wt 176.3 lb

## 2012-03-27 DIAGNOSIS — Z720 Tobacco use: Secondary | ICD-10-CM

## 2012-03-27 DIAGNOSIS — R05 Cough: Secondary | ICD-10-CM

## 2012-03-27 DIAGNOSIS — M2390 Unspecified internal derangement of unspecified knee: Secondary | ICD-10-CM

## 2012-03-27 DIAGNOSIS — R059 Cough, unspecified: Secondary | ICD-10-CM

## 2012-03-27 DIAGNOSIS — M1990 Unspecified osteoarthritis, unspecified site: Secondary | ICD-10-CM

## 2012-03-27 DIAGNOSIS — R062 Wheezing: Secondary | ICD-10-CM

## 2012-03-27 DIAGNOSIS — M2392 Unspecified internal derangement of left knee: Secondary | ICD-10-CM

## 2012-03-27 DIAGNOSIS — J189 Pneumonia, unspecified organism: Secondary | ICD-10-CM

## 2012-03-27 DIAGNOSIS — F17200 Nicotine dependence, unspecified, uncomplicated: Secondary | ICD-10-CM

## 2012-03-27 LAB — CBC WITH DIFFERENTIAL/PLATELET
Basophils Absolute: 0 10*3/uL (ref 0.0–0.1)
Basophils Relative: 0 % (ref 0–1)
Eosinophils Absolute: 0.2 10*3/uL (ref 0.0–0.7)
Eosinophils Relative: 2 % (ref 0–5)
HCT: 42 % (ref 36.0–46.0)
Hemoglobin: 13.8 g/dL (ref 12.0–15.0)
Lymphocytes Relative: 27 % (ref 12–46)
Lymphs Abs: 2.9 10*3/uL (ref 0.7–4.0)
MCH: 29.6 pg (ref 26.0–34.0)
MCHC: 32.9 g/dL (ref 30.0–36.0)
MCV: 89.9 fL (ref 78.0–100.0)
Monocytes Absolute: 0.9 10*3/uL (ref 0.1–1.0)
Monocytes Relative: 8 % (ref 3–12)
Neutro Abs: 6.7 10*3/uL (ref 1.7–7.7)
Neutrophils Relative %: 63 % (ref 43–77)
Platelets: 424 10*3/uL — ABNORMAL HIGH (ref 150–400)
RBC: 4.67 MIL/uL (ref 3.87–5.11)
RDW: 13.2 % (ref 11.5–15.5)
WBC: 10.7 10*3/uL — ABNORMAL HIGH (ref 4.0–10.5)

## 2012-03-27 MED ORDER — GUAIFENESIN ER 600 MG PO TB12: 600.0000 mg | ORAL_TABLET | Freq: Two times a day (BID) | ORAL | Status: DC

## 2012-03-27 MED ORDER — PREDNISONE 20 MG PO TABS: ORAL_TABLET | ORAL | Status: DC

## 2012-03-27 MED ORDER — ALBUTEROL SULFATE HFA 108 (90 BASE) MCG/ACT IN AERS: 2.0000 | INHALATION_SPRAY | Freq: Four times a day (QID) | RESPIRATORY_TRACT | Status: DC | PRN

## 2012-03-27 NOTE — Patient Instructions (Signed)
General Instructions: -Continue with your goal to quit smoking. You may call 1-800-QUIT-NOW for further help with quitting smoking -Take your prednisone as scheduled to help with your wheezing -Take Mucinex to help loosen up the mucus/phlegm -You have an order for knee braces today -You have prescription for albuterol inhaler -We need to see you next week or sooner if you have fever of temperature over 100.33F or if your symptoms worsen. You may go to the ED if the clinic is closed.  -Good job on taking your medications!   Treatment Goals:  Goals (1 Years of Data) as of 03/27/12         As of Today 03/10/12 01/30/12 01/28/12 11/07/11     Blood Pressure    . Blood Pressure < 140/90  113/72 115/76 151/92 135/81 126/72     Lifestyle    . Quit smoking / using tobacco   No  No      Result Component    . LDL CALC < 100            Progress Toward Treatment Goals:  Treatment Goal 03/27/2012  Blood pressure at goal  Stop smoking smoking less    Self Care Goals & Plans:  Self Care Goal 03/27/2012  Manage my medications take my medicines as prescribed; refill my medications on time  Monitor my health bring my blood pressure log to each visit  Eat healthy foods eat foods that are low in salt; drink diet soda or water instead of juice or soda  Be physically active -       Care Management & Community Referrals:  Referral 03/27/2012  Referrals made for care management support none needed     Smoking Cessation, Tips for Success YOU CAN QUIT SMOKING If you are ready to quit smoking, congratulations! You have chosen to help yourself be healthier. Cigarettes bring nicotine, tar, carbon monoxide, and other irritants into your body. Your lungs, heart, and blood vessels will be able to work better without these poisons. There are many different ways to quit smoking. Nicotine gum, nicotine patches, a nicotine inhaler, or nicotine nasal spray can help with physical craving. Hypnosis, support  groups, and medicines help break the habit of smoking. Here are some tips to help you quit for good.  Throw away all cigarettes.  Clean and remove all ashtrays from your home, work, and car.  On a card, write down your reasons for quitting. Carry the card with you and read it when you get the urge to smoke.  Cleanse your body of nicotine. Drink enough water and fluids to keep your urine clear or pale yellow. Do this after quitting to flush the nicotine from your body.  Learn to predict your moods. Do not let a bad situation be your excuse to have a cigarette. Some situations in your life might tempt you into wanting a cigarette.  Never have "just one" cigarette. It leads to wanting another and another. Remind yourself of your decision to quit.  Change habits associated with smoking. If you smoked while driving or when feeling stressed, try other activities to replace smoking. Stand up when drinking your coffee. Brush your teeth after eating. Sit in a different chair when you read the paper. Avoid alcohol while trying to quit, and try to drink fewer caffeinated beverages. Alcohol and caffeine may urge you to smoke.  Avoid foods and drinks that can trigger a desire to smoke, such as sugary or spicy foods and alcohol.  Ask  people who smoke not to smoke around you.  Have something planned to do right after eating or having a cup of coffee. Take a walk or exercise to perk you up. This will help to keep you from overeating.  Try a relaxation exercise to calm you down and decrease your stress. Remember, you may be tense and nervous for the first 2 weeks after you quit, but this will pass.  Find new activities to keep your hands busy. Play with a pen, coin, or rubber band. Doodle or draw things on paper.  Brush your teeth right after eating. This will help cut down on the craving for the taste of tobacco after meals. You can try mouthwash, too.  Use oral substitutes, such as lemon drops, carrots,  a cinnamon stick, or chewing gum, in place of cigarettes. Keep them handy so they are available when you have the urge to smoke.  When you have the urge to smoke, try deep breathing.  Designate your home as a nonsmoking area.  If you are a heavy smoker, ask your caregiver about a prescription for nicotine chewing gum. It can ease your withdrawal from nicotine.  Reward yourself. Set aside the cigarette money you save and buy yourself something nice.  Look for support from others. Join a support group or smoking cessation program. Ask someone at home or at work to help you with your plan to quit smoking.  Always ask yourself, "Do I need this cigarette or is this just a reflex?" Tell yourself, "Today, I choose not to smoke," or "I do not want to smoke." You are reminding yourself of your decision to quit, even if you do smoke a cigarette. HOW WILL I FEEL WHEN I QUIT SMOKING?  The benefits of not smoking start within days of quitting.  You may have symptoms of withdrawal because your body is used to nicotine (the addictive substance in cigarettes). You may crave cigarettes, be irritable, feel very hungry, cough often, get headaches, or have difficulty concentrating.  The withdrawal symptoms are only temporary. They are strongest when you first quit but will go away within 10 to 14 days.  When withdrawal symptoms occur, stay in control. Think about your reasons for quitting. Remind yourself that these are signs that your body is healing and getting used to being without cigarettes.  Remember that withdrawal symptoms are easier to treat than the major diseases that smoking can cause.  Even after the withdrawal is over, expect periodic urges to smoke. However, these cravings are generally short-lived and will go away whether you smoke or not. Do not smoke!  If you relapse and smoke again, do not lose hope. Most smokers quit 3 times before they are successful.  If you relapse, do not give up!  Plan ahead and think about what you will do the next time you get the urge to smoke. LIFE AS A NONSMOKER: MAKE IT FOR A MONTH, MAKE IT FOR LIFE Day 1: Hang this page where you will see it every day. Day 2: Get rid of all ashtrays, matches, and lighters. Day 3: Drink water. Breathe deeply between sips. Day 4: Avoid places with smoke-filled air, such as bars, clubs, or the smoking section of restaurants. Day 5: Keep track of how much money you save by not smoking. Day 6: Avoid boredom. Keep a good book with you or go to the movies. Day 7: Reward yourself! One week without smoking! Day 8: Make a dental appointment to get your teeth  cleaned. Day 9: Decide how you will turn down a cigarette before it is offered to you. Day 10: Review your reasons for quitting. Day 11: Distract yourself. Stay active to keep your mind off smoking and to relieve tension. Take a walk, exercise, read a book, do a crossword puzzle, or try a new hobby. Day 12: Exercise. Get off the bus before your stop or use stairs instead of escalators. Day 13: Call on friends for support and encouragement. Day 14: Reward yourself! Two weeks without smoking! Day 15: Practice deep breathing exercises. Day 16: Bet a friend that you can stay a nonsmoker. Day 17: Ask to sit in nonsmoking sections of restaurants. Day 18: Hang up "No Smoking" signs. Day 19: Think of yourself as a nonsmoker. Day 20: Each morning, tell yourself you will not smoke. Day 21: Reward yourself! Three weeks without smoking! Day 22: Think of smoking in negative ways. Remember how it stains your teeth, gives you bad breath, and leaves you short of breath. Day 23: Eat a nutritious breakfast. Day 24:Do not relive your days as a smoker. Day 25: Hold a pencil in your hand when talking on the telephone. Day 26: Tell all your friends you do not smoke. Day 27: Think about how much better food tastes. Day 28: Remember, one cigarette is one too many. Day 29: Take up a  hobby that will keep your hands busy. Day 30: Congratulations! One month without smoking! Give yourself a big reward. Your caregiver can direct you to community resources or hospitals for support, which may include:  Group support.  Education.  Hypnosis.  Subliminal therapy. Document Released: 10/21/2003 Document Revised: 04/16/2011 Document Reviewed: 11/08/2008 [facility] Patient Information 2013 [place], [place].

## 2012-03-27 NOTE — Progress Notes (Signed)
Called in tues 2/18

## 2012-03-28 NOTE — Assessment & Plan Note (Addendum)
She reports being afebrile since finishing her Levaquin. She is still wheezing at home and has run out of her albuterol inhaler (sample was given during her last visit). She has cough spells but she slept well after taking Robitussin for her cough. On physical exam today she is wheezing again, her O2 sats are 97% o RA. Her PORT score is 1 for her age, she does not meet admission criteria at this time.  -CBC with WBC trending down.  -Duo Neb with Xopenex (2/2 pt's tachycardia) and Atrovent  -She was offered injection of solumedrol but she declined -Continue Robitussin for cough at night time -Mucinex to loosen up phlegm during the day -Prednisone 10 day taper with 60mg  daily initially -Printed prescription for albuterol inhaler -She will need repeat CXR in 3 weeks for eval of her PNA--Of note, per Radiology she may need CT chest if right middle lobe opacity is persistent.

## 2012-03-28 NOTE — Assessment & Plan Note (Addendum)
She is current in the process of quitting, down to 1 cigarette per day. I encouraged her to follow through with her plan and gave her information on tips for success in smoking cessation. 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

## 2012-03-28 NOTE — Assessment & Plan Note (Addendum)
She has chronic arthritidis of her left knee. She has had left knee pain with knee popping and giving out with some falls recently but not injuries from her falls. She requests knee braces.  -Prescription for left knee braces -She may need referral to Orthopedic Surgery.

## 2012-03-28 NOTE — Progress Notes (Signed)
  Subjective:    Patient ID: Kara Mendoza, female    DOB: [birth year], 62 y.o.   MRN: [id number]  HPI Ms Dolinger is a pleasant 62 year old woman with PMH of remote CVA, GERD, and asthma who presents for follow up of recently diagnosed pneumonia. She was seen at this clinic on 2/3 and at that time was found to have Right middle lobe pneumonia. She was given prescription for Levaquin x5 days and prednisone x 5 days. Her fever subsided but her cough has persisted. Her cough is productive of yellow sputum as before and she has had coughing spells.She was given prescription for Robitussin last night and has been able to sleep better with improvement of her cough. She continues to use her albuterol inhaler daily and as needed. She reports still feeling tired but a little better. Her appetite is unchanged and she is able to eat and drink fluids. She denies confusion or dizziness.    Review of Systems  Constitutional: Positive for activity change. Negative for fever, chills, diaphoresis, appetite change, fatigue and unexpected weight change.       She feels more tired that usual but a little better after one night of sleep.   HENT: Positive for congestion and rhinorrhea. Negative for sore throat, trouble swallowing and postnasal drip.   Respiratory: Positive for cough, shortness of breath and wheezing. Negative for chest tightness and stridor.   Cardiovascular: Negative for chest pain, palpitations and leg swelling.  Gastrointestinal: Negative for nausea and vomiting.  Genitourinary: Negative for dysuria.  Musculoskeletal: Negative for myalgias and gait problem.  Skin: Negative for color change, pallor, rash and wound.  Neurological: Negative for dizziness, weakness, numbness and headaches.  Hematological: Negative for adenopathy. Does not bruise/bleed easily.  Psychiatric/Behavioral: Negative for behavioral problems, confusion and agitation.       Objective:   Physical Exam  Nursing note and vitals  reviewed. Constitutional: She is oriented to person, place, and time. She appears well-developed and well-nourished. No distress.  Tired appearing but improved since her last visit  HENT:  Nose: Nose normal.  Mouth/Throat: No oropharyngeal exudate.  Eyes: Conjunctivae are normal. Right eye exhibits no discharge. Left eye exhibits no discharge. No scleral icterus.  Cardiovascular:  tachycardia  Pulmonary/Chest: Effort normal. No respiratory distress. She has wheezes. She has rales. She exhibits no tenderness.  Inspiratory wheezing and ronchi through all lung fields  Abdominal: Soft. She exhibits no distension.  Musculoskeletal: She exhibits no edema and no tenderness.  Lymphadenopathy:    She has no cervical adenopathy.  Neurological: She is alert and oriented to person, place, and time. Coordination normal.  Skin: Skin is warm and dry. No rash noted. She is not diaphoretic. No erythema. No pallor.  Psychiatric: She has a normal mood and affect. Her behavior is normal. Thought content normal.          Assessment & Plan:

## 2012-04-02 ENCOUNTER — Ambulatory Visit: Payer: Medicare Other | Admitting: Internal Medicine

## 2012-04-09 ENCOUNTER — Telehealth: Payer: Self-pay | Admitting: *Deleted

## 2012-04-09 NOTE — Telephone Encounter (Signed)
ok 

## 2012-04-09 NOTE — Telephone Encounter (Signed)
Pt wants pain management referral

## 2012-04-09 NOTE — Telephone Encounter (Signed)
i made an appt for her w/ dr Tonny Branch 3/7 when i spoke w/ her yesterday, you didn't have anything soon

## 2012-04-09 NOTE — Telephone Encounter (Signed)
I reviewed the last visit with Dr. Garald Braver and there was no mention of uncontrolled pain. I last saw her in September 2013. What specifically does she need pain management for? Would she like to come to discuss treatment? I usually like to have a clear cut plan made with a patient during an appointment, not just refer to someone out of the blue. Thanks.

## 2012-04-11 ENCOUNTER — Ambulatory Visit: Payer: Medicare Other | Admitting: Internal Medicine

## 2012-04-15 ENCOUNTER — Encounter: Payer: Self-pay | Admitting: Internal Medicine

## 2012-04-15 ENCOUNTER — Other Ambulatory Visit: Payer: Self-pay | Admitting: Internal Medicine

## 2012-04-15 ENCOUNTER — Ambulatory Visit (HOSPITAL_COMMUNITY)
Admission: RE | Admit: 2012-04-15 | Discharge: 2012-04-15 | Disposition: A | Discharge status: Home / Self Care | Payer: Medicare HMO | Source: Ambulatory Visit | Attending: Internal Medicine | Admitting: Internal Medicine

## 2012-04-15 ENCOUNTER — Ambulatory Visit (INDEPENDENT_AMBULATORY_CARE_PROVIDER_SITE_OTHER): Payer: Medicare Other | Admitting: Internal Medicine

## 2012-04-15 VITALS — BP 134/76 | HR 96 | Temp 98.4°F | Wt 175.3 lb

## 2012-04-15 DIAGNOSIS — R918 Other nonspecific abnormal finding of lung field: Secondary | ICD-10-CM | POA: Insufficient documentation

## 2012-04-15 DIAGNOSIS — J189 Pneumonia, unspecified organism: Secondary | ICD-10-CM

## 2012-04-15 DIAGNOSIS — R9389 Abnormal findings on diagnostic imaging of other specified body structures: Secondary | ICD-10-CM

## 2012-04-15 DIAGNOSIS — G894 Chronic pain syndrome: Secondary | ICD-10-CM

## 2012-04-15 DIAGNOSIS — G8929 Other chronic pain: Secondary | ICD-10-CM

## 2012-04-15 DIAGNOSIS — M5380 Other specified dorsopathies, site unspecified: Secondary | ICD-10-CM | POA: Insufficient documentation

## 2012-04-15 MED ORDER — OXYCODONE-ACETAMINOPHEN 10-325 MG PO TABS: 1.0000 | ORAL_TABLET | Freq: Four times a day (QID) | ORAL | Status: DC | PRN

## 2012-04-15 MED ORDER — PREGABALIN 50 MG PO CAPS: 50.0000 mg | ORAL_CAPSULE | Freq: Three times a day (TID) | ORAL | Status: DC

## 2012-04-15 NOTE — Progress Notes (Signed)
Subjective:     Patient ID: Kara Mendoza, female   DOB: 09/17/1950, 62 y.o.   MRN: [id number]  HPI The patient is a 62 YO female who comes in today because she is upset with her pain management clinic and does not want to return. Her pharmacy was out of her pain medication and gave her a substitute. She then called the pain clinic requesting new rx for her medicine so that she could take it elsewhere and they denied this. She then had visit with them and they rxed the other medicine which she states does not help although she has been taking it intermittently. She states that she was always on pain medicines since her stroke in 2007 or 2008. She is not having any new or worrisome symptoms. She still has occasional cough but states that her breathing is good and her cough is much alleviated since the beginning of February when she had pneumonia.   Review of Systems  Constitutional: Negative for fever, chills, diaphoresis, activity change, appetite change, fatigue and unexpected weight change.  Respiratory: Positive for cough. Negative for apnea, choking, chest tightness, shortness of breath, wheezing and stridor.        Intermittent  Cardiovascular: Negative for chest pain, palpitations and leg swelling.  Gastrointestinal: Negative.   Genitourinary: Negative.   Musculoskeletal: Positive for myalgias and arthralgias. Negative for back pain, joint swelling and gait problem.  Skin: Negative for color change, pallor, rash and wound.  Neurological: Positive for numbness and headaches. Negative for dizziness, tremors, seizures, syncope, facial asymmetry, speech difficulty, weakness and light-headedness.  Hematological: Negative for adenopathy. Does not bruise/bleed easily.       Objective:   Physical Exam  Constitutional: She is oriented to person, place, and time. She appears well-developed and well-nourished. No distress.  Some intermittent tearing and slurring likely residual from stroke.  HENT:   Head: Normocephalic and atraumatic.  Eyes: EOM are normal. Pupils are equal, round, and reactive to light.  Neck: Normal range of motion. Neck supple.  Cardiovascular: Normal rate and normal heart sounds.   Pulmonary/Chest: Effort normal and breath sounds normal. No respiratory distress. She has no wheezes. She has no rales.  Abdominal: Soft. Bowel sounds are normal. She exhibits no distension. There is no tenderness. There is no rebound.  Musculoskeletal: Normal range of motion. She exhibits no edema and no tenderness.  Neurological: She is alert and oriented to person, place, and time.  Skin: Skin is warm and dry.       Assessment/Plan:   1. Chronic pain s/p CVA - Appears patient has not been on nerve pain medication and will trial lyrica since patient had negative experience with gabapentin in the past. Will also refer her to different pain clinic. Will get urine prescription drug monitoring. She claims no other medications except hydrocodone which she thinks was last taken Friday (4 days prior to test).   2. Follow up CXR from pneumonia - CXR ordered and followed up on. Appears to be clearing however some residual so will have her return in 6 weeks to document clearing versus remnant in the left mid lung.  3. Disposition - Return in 6 weeks. Referral made to pain clinic. Lyrica 50 mg PO TID rx given. Also oxycodone/acetaminophen 10/325 1 pill q 6 hours prn #56 for a two week supply. No refills.

## 2012-04-15 NOTE — Patient Instructions (Addendum)
We would like you to start taking lyrica 1 pill 3 times per day for your pain. This will hopefully decrease the amount of pain medicine you will need.   We will give you a two week supply of medicine for pain until you can see the new pain doctor.   Come back in 6 weeks to follow up with your chest symptoms.

## 2012-04-16 LAB — PRESCRIPTION ABUSE MONITORING 15P, URINE
Amphetamine/Meth: NEGATIVE ng/mL
Barbiturate Screen, Urine: NEGATIVE ng/mL
Benzodiazepine Screen, Urine: NEGATIVE ng/mL
Buprenorphine, Urine: NEGATIVE ng/mL
Cannabinoid Scrn, Ur: NEGATIVE ng/mL
Carisoprodol, Urine: NEGATIVE ng/mL
Cocaine Metabolites: NEGATIVE ng/mL
Creatinine, Urine: 95.51 mg/dL (ref >20.0)
Fentanyl, Ur: NEGATIVE ng/mL
Meperidine, Ur: NEGATIVE ng/mL
Methadone Screen, Urine: NEGATIVE ng/mL
Oxycodone Screen, Ur: NEGATIVE ng/mL
Propoxyphene: NEGATIVE ng/mL
Tramadol Scrn, Ur: NEGATIVE ng/mL
Zolpidem, Urine: NEGATIVE ng/mL

## 2012-04-17 LAB — OPIATES/OPIOIDS (LC/MS-MS)
Codeine Urine: 59 ng/mL
Heroin (6-AM), UR: NEGATIVE ng/mL
Hydrocodone: NEGATIVE ng/mL
Hydromorphone: NEGATIVE ng/mL
Morphine Urine: NEGATIVE ng/mL
Norhydrocodone, Ur: NEGATIVE ng/mL
Noroxycodone, Ur: NEGATIVE ng/mL
Oxycodone, ur: NEGATIVE ng/mL
Oxymorphone: NEGATIVE ng/mL

## 2012-04-29 ENCOUNTER — Encounter: Payer: Self-pay | Admitting: Internal Medicine

## 2012-04-29 ENCOUNTER — Telehealth: Payer: Self-pay | Admitting: *Deleted

## 2012-04-29 NOTE — Telephone Encounter (Signed)
Kara Mendoza, have her be seen in clinic tomorrow. She has a previous drug screen that was positive for codeine and negative for oxycodone. She told Dr. [name] that her last dose of Percocet was 4 days prior to urine drug test, but tells me after learning about her urine drug test that maybe it was longer.  She is having disagreements with her current pain management physician.  I am concerned about diversion here.  She needs to have a repeat drug screen tomorrow, be referred to a different pain management physician, and given a limited supply of pain medication AFTER the repeat drug screen is done. None will be prescribed at this time. I discussed this with her and she understands.

## 2012-04-29 NOTE — Telephone Encounter (Signed)
Pt scheduled for tomorrow at 2:30, pt informed.

## 2012-04-29 NOTE — Telephone Encounter (Signed)
Pt called asking to speak with Dr Kem Kays. She is upset because she has not been seen in pain clinic and she only received 2 weeks of pain medication at her last visit 3/11. I talked with [facility] and she has put in for referral to pain clinic but it usually takes a long time to get pt seen. Would you consider supplying pain medication until she is seen? Please call pt @ [phone]

## 2012-04-29 NOTE — Progress Notes (Deleted)
Patient ID: Kara Mendoza, female   DOB: 1950-07-28, 62 y.o.   MRN: [id number] Can we get records from her pain management physician in [facility]? [phone].  I believe it is Dr. Tonye Royalty. Thanks.

## 2012-04-30 ENCOUNTER — Encounter: Payer: Self-pay | Admitting: Internal Medicine

## 2012-04-30 ENCOUNTER — Other Ambulatory Visit: Payer: Self-pay | Admitting: Internal Medicine

## 2012-04-30 ENCOUNTER — Ambulatory Visit (INDEPENDENT_AMBULATORY_CARE_PROVIDER_SITE_OTHER): Payer: Medicare HMO | Admitting: Internal Medicine

## 2012-04-30 VITALS — BP 123/73 | HR 107 | Temp 98.4°F | Ht 62.0 in | Wt 178.1 lb

## 2012-04-30 DIAGNOSIS — G894 Chronic pain syndrome: Secondary | ICD-10-CM

## 2012-04-30 MED ORDER — PREGABALIN 25 MG PO CAPS: 25.0000 mg | ORAL_CAPSULE | Freq: Three times a day (TID) | ORAL | Status: AC

## 2012-04-30 NOTE — Patient Instructions (Signed)
We will check your urine and make a decision about medicine. We will not prescribe anything at this time and would recommend that you try going back to your pain doctor until you have a new one.   In the meantime, we will give you a new prescription for lyrica at a lower dose to help with the sleepiness. Use 1 tablet up to 3 times a day for pain.  Our number is [phone] if you have problems or questions.

## 2012-05-01 ENCOUNTER — Telehealth: Payer: Self-pay | Admitting: *Deleted

## 2012-05-01 ENCOUNTER — Ambulatory Visit (INDEPENDENT_AMBULATORY_CARE_PROVIDER_SITE_OTHER): Payer: Medicare HMO | Admitting: Internal Medicine

## 2012-05-01 ENCOUNTER — Encounter: Payer: Self-pay | Admitting: Internal Medicine

## 2012-05-01 VITALS — BP 119/71 | HR 84 | Temp 98.9°F | Ht 62.0 in | Wt 179.4 lb

## 2012-05-01 DIAGNOSIS — Z720 Tobacco use: Secondary | ICD-10-CM

## 2012-05-01 DIAGNOSIS — R32 Unspecified urinary incontinence: Secondary | ICD-10-CM

## 2012-05-01 DIAGNOSIS — R269 Unspecified abnormalities of gait and mobility: Secondary | ICD-10-CM | POA: Insufficient documentation

## 2012-05-01 DIAGNOSIS — F17200 Nicotine dependence, unspecified, uncomplicated: Secondary | ICD-10-CM

## 2012-05-01 DIAGNOSIS — I10 Essential (primary) hypertension: Secondary | ICD-10-CM

## 2012-05-01 LAB — PRESCRIPTION ABUSE MONITORING 15P, URINE
Amphetamine/Meth: NEGATIVE ng/mL
Barbiturate Screen, Urine: NEGATIVE ng/mL
Benzodiazepine Screen, Urine: NEGATIVE ng/mL
Buprenorphine, Urine: NEGATIVE ng/mL
Cannabinoid Scrn, Ur: NEGATIVE ng/mL
Carisoprodol, Urine: NEGATIVE ng/mL
Cocaine Metabolites: NEGATIVE ng/mL
Creatinine, Urine: 95.48 mg/dL (ref >20.0)
Fentanyl, Ur: NEGATIVE ng/mL
Meperidine, Ur: NEGATIVE ng/mL
Methadone Screen, Urine: NEGATIVE ng/mL
Propoxyphene: NEGATIVE ng/mL
Tramadol Scrn, Ur: NEGATIVE ng/mL
Zolpidem, Urine: NEGATIVE ng/mL

## 2012-05-01 MED ORDER — DIAPERS & SUPPLIES MISC: 1.0000 | Freq: Three times a day (TID) | Status: DC

## 2012-05-01 MED ORDER — KNEE BRACE MISC: 1.0000 | Freq: Every day | Status: DC

## 2012-05-01 NOTE — Assessment & Plan Note (Signed)
Will get UA Likely urge incontinence or due to physical deconditioning Less likely due to overflow as HA1C 5.7 10/2011  Rx diapers temporarily but advised may make prone to UTI

## 2012-05-01 NOTE — Telephone Encounter (Addendum)
Received PA request from pt's pharmacy regarding rx for Lyrica 25 mg one tab three times daily #90.  Call made to medicaid to correct qty from #30 to #90 (as claim was submitted online wtih incorrect qty).  Medicaid informed me at that time that pt has a medicare D plan that pays for her prescriptions.  I contacted pharmacy to have them submit under pt's Medicare plan and rx was sent through.Phone call completeGoldston, Telford Archambeau Cassady3/27/201410:53 AM

## 2012-05-01 NOTE — Progress Notes (Signed)
  Subjective:    Patient ID: Kara Mendoza, female    DOB: 02-20-1950, 62 y.o.   MRN: [id number]  HPI Comments: 62 y.o PMH left temporal parietal stroke (2008) with left hemiparesis and memory loss, HTN (BP 119/71), hld, tobacco abuse.  She presents requesting a rolling walker, diapers, and a knee brace.  She states for the last 1 year she has had incontinence and she did not know who to tell.  At times she can not make it to the restroom especially at night or if she is out in public.  She denies fecal incontinence or dysuria.   She reports more falls at home due to her left knee giving out.  She is awaiting referral from Neuro rehab.     SH: lives alone though her son lives with her sometimes.  She is still smoking though she has cut down from 1ppd to 4 cigarettes to 2 cigarettes daily.   PsuH: She has had a C-section with all pregnancies.       Review of Systems  Genitourinary: Negative for dysuria.  Neurological: Negative for dizziness and light-headedness.       Objective:   Physical Exam  Nursing note and vitals reviewed. Constitutional: She is oriented to person, place, and time. Vital signs are normal. She appears well-developed and well-nourished. She is cooperative. No distress.  HENT:  Head: Normocephalic and atraumatic.  Mouth/Throat: Oropharynx is clear and moist. No oropharyngeal exudate.  Eyes: Conjunctivae are normal. Pupils are equal, round, and reactive to light. Right eye exhibits no discharge. Left eye exhibits no discharge. No scleral icterus.  Cardiovascular: Normal rate, regular rhythm and normal heart sounds.   No murmur heard. Pulmonary/Chest: Effort normal. No respiratory distress. She has no wheezes.  Abdominal: Soft. Bowel sounds are normal. There is no tenderness.  Neurological: She is alert and oriented to person, place, and time.  Left lower extremity with 4/5 strength Baseline walks with cane  Skin: Skin is warm, dry and intact. No rash noted. She is  not diaphoretic.  Psychiatric: She has a normal mood and affect. Her speech is normal and behavior is normal. Judgment and thought content normal. Cognition and memory are normal.  Stuttering speech          Assessment & Plan:  F/u at next scheduled appt

## 2012-05-01 NOTE — Telephone Encounter (Signed)
I called pt this morning about her urinary incontinence issue. I had sent Dr Kem Kays a mesage about this who responded that pt needs to schedule another  appt to discuss this issue since it had not been discussed before (pt had stated yesterday she had not mentioned this issue to anyone). Pt has been schedule for today w/Dr Shirlee Latch.

## 2012-05-01 NOTE — Patient Instructions (Addendum)
General Instructions: Follow up as needed at your next scheduled appointment    Treatment Goals:  Goals (1 Years of Data) as of 05/01/12         As of Today 04/30/12 04/15/12 03/27/12 03/10/12     Blood Pressure    . Blood Pressure < 140/90  119/71 123/73 134/76 113/72 115/76     Lifestyle    . Quit smoking / using tobacco   No  No No     Result Component    . LDL CALC < 100            Progress Toward Treatment Goals:  Treatment Goal 05/01/2012  Blood pressure at goal  Stop smoking smoking less    Self Care Goals & Plans:  Self Care Goal 05/01/2012  Manage my medications take my medicines as prescribed; bring my medications to every visit; refill my medications on time  Monitor my health keep track of my blood pressure  Eat healthy foods drink diet soda or water instead of juice or soda; eat more vegetables; eat foods that are low in salt; eat baked foods instead of fried foods; eat fruit for snacks and desserts; eat smaller portions  Be physically active (No Data)  Stop smoking set a quit date and stop smoking       Care Management & Community Referrals:  Referral 05/01/2012  Referrals made for care management support -  Referrals made to community resources (No Data)       Fall Prevention and Home Safety Falls cause injuries and can affect all age groups. It is possible to prevent falls.  HOW TO PREVENT FALLS  Wear shoes with rubber soles that do not have an opening for your toes.  Keep the inside and outside of your house well lit.  Use night lights throughout your home.  Remove clutter from floors.  Clean up floor spills.  Remove throw rugs or fasten them to the floor with carpet tape.  Do not place electrical cords across pathways.  Put grab bars by your tub, shower, and toilet. Do not use towel bars as grab bars.  Put handrails on both sides of the stairway. Fix loose handrails.  Do not climb on stools or stepladders, if possible.  Do not wax  your floors.  Repair uneven or unsafe sidewalks, walkways, or stairs.  Keep items you use a lot within reach.  Be aware of pets.  Keep emergency numbers next to the telephone.  Put smoke detectors in your home and near bedrooms. Ask your doctor what other things you can do to prevent falls. Document Released: 11/18/2008 Document Revised: 07/24/2011 Document Reviewed: 04/24/2011 [facility] Patient Information 2013 [place], [place].  Urinary Incontinence Your doctor wants you to have this information about urinary incontinence. This is the inability to keep urine in your body until you decide to release it. CAUSES  Prostate gland enlargement is a common cause of urinary incontinence. But there are many different causes for losing urinary control. They include:  Medicines.  Infections.  Prostate problems.  Surgery.  Neurological diseases.  Emotional factors. DIAGNOSIS  Evaluating the cause of incontinence is important in choosing the best treatment. This may require:  An ultrasound exam.  Kidney and bladder X-rays.  Cystoscopy. This is an exam of the bladder using a narrow scope. TREATMENT  For incontinent patients, normal daily hygiene and using changing pads or adult diapers regularly will prevent offensive odors and skin damage from the moisture. Changing your medicines may  help control incontinence. Your caregiver may prescribe some medicines to help you regain control. Avoid caffeine. It can over-stimulate the bladder. Use the bathroom regularly. Try about every 2 to 3 hours even if you do not feel the need. Take time to empty your bladder completely. After urinating, wait a minute. Then try to urinate again. External devices used to catch urine or an indwelling urine catheter (Foley catheter) may be needed as well. Some prostate gland problems require surgery to correct. Call your caregiver for more information. Document Released: 03/01/2004 Document Revised: 04/16/2011  Document Reviewed: 02/25/2008 [facility] Patient Information 2013 [place], [place].

## 2012-05-01 NOTE — Assessment & Plan Note (Addendum)
BP Readings from Last 3 Encounters:  05/01/12 119/71  04/30/12 123/73  04/15/12 134/76    Lab Results  Component Value Date   NA 140 03/10/2012   K 3.4* 03/10/2012   CREATININE 0.81 03/10/2012    Assessment:  Blood pressure control: controlled  Progress toward BP goal:  at goal  Comments: continue treatment with Hyzaar 100-25 mg qd  Plan:  Medications:  continue current medications  Educational resources provided: brochure  Self management tools provided:    Other plans: none

## 2012-05-01 NOTE — Telephone Encounter (Signed)
Message copied by Hassan Buckler on Thu May 01, 2012  9:58 AM ------      Message from: Jonah Blue      Created: Wed Apr 30, 2012  4:39 PM       Rivka Barbara,      I'm wondering why she has not mentioned this to Dr. Dorise Hiss or myself before? Urinary incontinence evaluation requires a visit to discuss differential diagnoses, including urinary tract infection evaluation in addition to differentiating between other causes (urge incontinence, detrusor hyperactivity, etc).  In addition, a thorough review of medications needs to be performed. My point is, prescribing diapers without an appropriate exam and history is inappropriate. I would suggest she be scheduled to come in again in the next week or so to be seen for this. Thanks.                  ----- Message -----         From: Hassan Buckler, RN         Sent: 04/30/2012   4:19 PM           To: Jonah Blue, DO            Pt was here today and saw Dr Dorise Hiss. Pt states she's incontinent and requesting an rx for diapers.       Thanks       ------

## 2012-05-01 NOTE — Progress Notes (Signed)
Subjective:     Patient ID: Kara Mendoza, female   DOB: [birth year], 62 y.o.   MRN: [id number]  HPI The patient comes back at the request of her PCP today. She was given 2 weeks of percocet at last visit about 2 weeks ago. She has been taking that since then. She has run out and would like to have more until she can get it with new pain clinic. She has not been kicked out of pain clinic. No differences since last visit. She did pick up the lyrica that was prescribed and states that this worked but made her drowsy. No chest pain. She is upset that she will not receive narcotics today. Her cough and congestion symptoms have improved since last visit 2 weeks ago.  Review of Systems  Constitutional: Negative for fever, chills, diaphoresis, activity change, appetite change, fatigue and unexpected weight change.  Respiratory: Negative for apnea, cough, choking, chest tightness, shortness of breath, wheezing and stridor.   Cardiovascular: Negative for chest pain, palpitations and leg swelling.  Gastrointestinal: Negative.   Genitourinary: Negative.   Musculoskeletal: Positive for myalgias and arthralgias. Negative for back pain, joint swelling and gait problem.  Skin: Negative for color change, pallor, rash and wound.  Neurological: Positive for numbness and headaches. Negative for dizziness, tremors, seizures, syncope, facial asymmetry, speech difficulty, weakness and light-headedness.  Hematological: Negative for adenopathy. Does not bruise/bleed easily.       Objective:   Physical Exam  Constitutional: She is oriented to person, place, and time. She appears well-developed and well-nourished. No distress.  Some intermittent tearing and slurring likely residual from stroke.  HENT:  Head: Normocephalic and atraumatic.  Eyes: EOM are normal. Pupils are equal, round, and reactive to light.  Neck: Normal range of motion. Neck supple.  Cardiovascular: Normal rate and normal heart sounds.    Pulmonary/Chest: Effort normal and breath sounds normal. No respiratory distress. She has no wheezes. She has no rales.  Abdominal: Soft. Bowel sounds are normal. She exhibits no distension. There is no tenderness. There is no rebound.  Musculoskeletal: Normal range of motion. She exhibits no edema and no tenderness.  Neurological: She is alert and oriented to person, place, and time.  Skin: Skin is warm and dry.       Assessment:   1. Pain - Lyrica did seem to have benefit to her and I did prescribe her 25 mg tablets today (previously she was given 50 mg tabs). Will trial to see if these help without making her sleepy. Would recommend not rxing any more narcotics for her as the lyrica did help. Will check urine drug screen at today's visit. Last one was inappropriate as it had codeine in it which she was not prescribed. She should have percocet (oxycodone) in today's screen as she admitted to using percocet day prior to visit. If inapropriate will update flag so that we do not ever give her narcotics. Will continue to pursue additional pain clinic for her.   2. Addendum - After the MD left the room and as the patient was leaving she asked for prescription for diapers and as she had not brought the need for these up at all I left the decision up to the PCP who presumably knows more about her chronic needs.   3. Disposition - Of note, patient should still return in 1 month for CXR to show clearing of lesion. Rx for lyrica 25 mg PO TID prn given and advised patient she could always  return to her pain doctor until new doctor is established. No narcotic script given to patient at today's visit.

## 2012-05-01 NOTE — Assessment & Plan Note (Addendum)
  Assessment:  Progress toward smoking cessation:  smoking less  Barriers to progress toward smoking cessation:  none  Comments: encouraged smoking cessation  Plan:  Instruction/counseling given:  I advised patient to stop smoking.  Educational resources provided:  [employer]) brochure  Self management tools provided:     Medications to assist with smoking cessation:  none Patient agreed to the following self-care plans for smoking cessation:  set a quit date and stop smoking   Other: none  Encouraged smoking cessation

## 2012-05-01 NOTE — Assessment & Plan Note (Signed)
Baseline walks with cane Already referred for Neuro rehab awaiting call back  Will order rolling walker with seat to be picked up by patient at medical supply store

## 2012-05-01 NOTE — Telephone Encounter (Signed)
Appt is today at 1415PM.

## 2012-05-02 ENCOUNTER — Encounter: Payer: Self-pay | Admitting: Internal Medicine

## 2012-05-02 LAB — URINALYSIS, ROUTINE W REFLEX MICROSCOPIC
Bilirubin Urine: NEGATIVE
Glucose, UA: NEGATIVE mg/dL
Hgb urine dipstick: NEGATIVE
Ketones, ur: NEGATIVE mg/dL
Nitrite: NEGATIVE
Protein, ur: NEGATIVE mg/dL
Specific Gravity, Urine: 1.025 (ref 1.005–1.030)
Urobilinogen, UA: 0.2 mg/dL (ref 0.0–1.0)
pH: 5.5 (ref 5.0–8.0)

## 2012-05-02 LAB — URINALYSIS, MICROSCOPIC ONLY
Bacteria, UA: NONE SEEN
Casts: NONE SEEN
Crystals: NONE SEEN

## 2012-05-05 LAB — OPIATES/OPIOIDS (LC/MS-MS)
Codeine Urine: NEGATIVE ng/mL
Heroin (6-AM), UR: NEGATIVE ng/mL
Hydrocodone: NEGATIVE ng/mL
Hydromorphone: NEGATIVE ng/mL
Morphine Urine: NEGATIVE ng/mL
Norhydrocodone, Ur: NEGATIVE ng/mL
Noroxycodone, Ur: 1441 ng/mL — ABNORMAL HIGH
Oxycodone, ur: 1395 ng/mL — ABNORMAL HIGH
Oxymorphone: 1149 ng/mL — ABNORMAL HIGH

## 2012-05-05 LAB — OXYCODONE, URINE (LC/MS-MS)
Noroxycodone, Ur: 1441 ng/mL — ABNORMAL HIGH
Oxycodone, ur: 1395 ng/mL — ABNORMAL HIGH
Oxymorphone: 1149 ng/mL — ABNORMAL HIGH

## 2012-05-07 ENCOUNTER — Telehealth: Payer: Self-pay | Admitting: Internal Medicine

## 2012-05-07 ENCOUNTER — Telehealth: Payer: Self-pay | Admitting: *Deleted

## 2012-05-07 ENCOUNTER — Ambulatory Visit: Payer: Medicare HMO | Attending: Internal Medicine | Admitting: Physical Therapy

## 2012-05-07 DIAGNOSIS — IMO0001 Reserved for inherently not codable concepts without codable children: Secondary | ICD-10-CM | POA: Insufficient documentation

## 2012-05-07 DIAGNOSIS — R262 Difficulty in walking, not elsewhere classified: Secondary | ICD-10-CM | POA: Insufficient documentation

## 2012-05-07 NOTE — Telephone Encounter (Signed)
The patient's aide Rodell Perna called about her urine drug tests. I called her back and informed about the results. She said that the patient has run out of her oxycodone and wanted a refill. However, per Dr. Lavonna Monarch note from last visit, she denied giving her pain medications. I have asked RN Venita Sheffield to clarify from Dr. Dorise Hiss about the reason for doing so. She will page Dr. Dorise Hiss about this.

## 2012-05-07 NOTE — Telephone Encounter (Signed)
I called her pain management clinic to inquire about her narcotic contract with them. Dr. Nilsa Nutting is not in at this time.  Per their records, she has a narcotic contract with them and is not allowed to have any narcotics prescribed outside of their practice. She is to call their office with any concerns. She has not been dismissed from their practice and failed to show up for her April 1st appointment. The staff asked me to speak to Dr. Nilsa Nutting when he is in on Friday at [phone].

## 2012-05-07 NOTE — Telephone Encounter (Signed)
Spoke with patient about getting refills on her pain medication from her previous pain doctor.  Pt said that she does not wish to contact the previous physician for her pain meds said that the previous doctor refused to give her any more  pain meds and felt that he did not have her best interest in mind while treating her  Pt was informed that I  would need to contact her PCP about her decision.  Spoke with Dr. Kem Kays is awaiting the patients records from her previous pain doctor.  Angelina Ok, RN 05/07/2012 11:30 AM

## 2012-05-08 ENCOUNTER — Telehealth: Payer: Self-pay | Admitting: *Deleted

## 2012-05-08 NOTE — Telephone Encounter (Signed)
Pt informed, voices understanding and will call Dr. Nilsa Nutting for appointment.

## 2012-05-08 NOTE — Telephone Encounter (Signed)
I called her pain management physician with the permission of Kara Mendoza, the patient, who I called earlier today. I had a 20 minute discussion with Dr. Nilsa Nutting, her pain management physician. He is VERY concerned about diversion and maladaptive behavior.  He has sent her for counseling in the past and he believes she needs referral to an addiction specialist. She requests VERY specific brand names of the pain medication and is upset when she doesn't receive her requested medication. He advised me to have her call his office and make an appointment to discuss this further, even if it means terminating her agreement and being referred to a different physician. For now, NO narcotics are to be prescribed by our clinic. Please call the patient and ask her to call Dr. Charlann Boxer office to set up an appointment and discuss her care.

## 2012-05-08 NOTE — Telephone Encounter (Signed)
Kara Mendoza, I will give her a call this afternoon, but I have already called her pain management clinic and they advised that she has a narcotic contract with them and did not show up to her April 1st appointment.

## 2012-05-08 NOTE — Telephone Encounter (Signed)
Pt called very upset, stating we were playing with her life.  She is requesting pain medication that Dr Kem Kays told her she could have.  She states he told her he would give her one month supply until paperwork was received. Looks like last refill was 3/11 for 56 percocet. Pt wants to talk with Dr Kem Kays. Please call her at [phone]

## 2012-05-09 ENCOUNTER — Ambulatory Visit: Payer: Medicare HMO | Admitting: Physical Therapy

## 2012-05-14 ENCOUNTER — Ambulatory Visit: Payer: Medicare HMO | Admitting: Physical Therapy

## 2012-05-15 ENCOUNTER — Ambulatory Visit: Payer: Medicare HMO | Admitting: Physical Therapy

## 2012-05-21 ENCOUNTER — Ambulatory Visit: Payer: Medicare HMO | Admitting: Physical Therapy

## 2012-05-21 ENCOUNTER — Telehealth: Payer: Self-pay | Admitting: *Deleted

## 2012-05-21 NOTE — Telephone Encounter (Signed)
Pt states she was told at therapy she needed a referral to ortho because she is not getting any better, please forward this to your nurse for completion

## 2012-05-21 NOTE — Telephone Encounter (Signed)
Did PT specific ortho referral? What joint?

## 2012-05-22 ENCOUNTER — Telehealth: Payer: Self-pay | Admitting: *Deleted

## 2012-05-22 MED ORDER — TRAMADOL HCL 50 MG PO TABS: 50.0000 mg | ORAL_TABLET | Freq: Two times a day (BID) | ORAL | Status: DC | PRN

## 2012-05-22 NOTE — Telephone Encounter (Signed)
OK. Please tell her to use it short term only. If she develops confusion, fever, shortness of breath, hot flashes, please stop the drug and call us immediately. Thanks.

## 2012-05-22 NOTE — Telephone Encounter (Signed)
Call from pt's aide,[name] - states pt is having "a lot of pain"; tried Tylenol, not working. Waiting on appt from the Pain Clinic - wants something For pain until that appt is scheduled. Thanks

## 2012-05-22 NOTE — Telephone Encounter (Signed)
Kara Mendoza called and informed if pt develops SOB/fever/confusion/hot flashes, stop Tramadol and call the clinic per Dr Kem Kays. She agreed.

## 2012-05-22 NOTE — Telephone Encounter (Signed)
Kara Mendoza, if you have a moment take a look at my past telephone notes and the FYI's. I can send for Ultram, but no narcotics. Will this be ok? Thanks.

## 2012-05-22 NOTE — Telephone Encounter (Signed)
Talked to the [facility], stated Ultram will be ok - "better than nothing".  Pleases send rx to [facility] on [address] Thnaks

## 2012-05-26 ENCOUNTER — Encounter: Payer: Self-pay | Admitting: *Deleted

## 2012-05-26 NOTE — Addendum Note (Signed)
Addended by: Angelina Ok F on: 05/26/2012 02:36 PM   Modules accepted: Orders

## 2012-05-27 ENCOUNTER — Ambulatory Visit: Payer: Medicare HMO | Admitting: Physical Therapy

## 2012-05-27 ENCOUNTER — Other Ambulatory Visit: Payer: Self-pay | Admitting: Internal Medicine

## 2012-05-27 ENCOUNTER — Telehealth: Payer: Self-pay | Admitting: *Deleted

## 2012-05-27 DIAGNOSIS — I639 Cerebral infarction, unspecified: Secondary | ICD-10-CM

## 2012-05-27 MED ORDER — ASPIRIN-DIPYRIDAMOLE ER 25-200 MG PO CP12: 1.0000 | ORAL_CAPSULE | Freq: Two times a day (BID) | ORAL | Status: DC

## 2012-05-27 NOTE — Telephone Encounter (Signed)
Who is writing for this medication and when was the last time it was filled??

## 2012-05-27 NOTE — Telephone Encounter (Signed)
[facility] requesting a refill on Aggrenox cp 12 -Take 1 cap twice daily;  Qty#180. Not seen on current med list. Thanks

## 2012-05-27 NOTE — Telephone Encounter (Signed)
Kara Mendoza, never mind. I see it just fell off our medication list, I reviewed the chart and saw it was in the medication history but fell of active meds.

## 2012-05-28 NOTE — Telephone Encounter (Signed)
Rx refilled 4/22 per Dr Kem Kays.

## 2012-05-29 ENCOUNTER — Telehealth: Payer: Self-pay | Admitting: *Deleted

## 2012-05-29 NOTE — Telephone Encounter (Signed)
I tried to call Kara Mendoza at the number listed without success. Please have her call me back this afternoon. I need to know details. Thanks.

## 2012-05-29 NOTE — Telephone Encounter (Signed)
Call from [facility] this am pt has been to therapy for a few visits.  Belenda Cruise said that patients pain has not changed with the therapy-recommends she get a referral to see Orthopaedics.  Belenda Cruise can be reached at [phone] for questions.  Angelina Ok, RN 05/29/2012 10:09 AM.

## 2012-05-30 ENCOUNTER — Ambulatory Visit: Payer: Medicare HMO | Admitting: Physical Therapy

## 2012-06-02 ENCOUNTER — Ambulatory Visit: Payer: Medicare HMO | Admitting: Physical Therapy

## 2012-06-03 ENCOUNTER — Telehealth: Payer: Self-pay | Admitting: *Deleted

## 2012-06-03 NOTE — Telephone Encounter (Signed)
Patient is requesting narcotic medication once again.  I explained to her that I am not comfortable prescribing narcotics. She understands why. She needs a new pain management referral, which is already in the works.

## 2012-06-03 NOTE — Telephone Encounter (Signed)
The pt's aid calls and is very difficult, i cannot find in chart where patrece Hyacinth Meeker is listed as pt's contact person. Ms Hyacinth Meeker is very angry stating that pt needs pain medicine that tramadol makes pt sick on stomach. That it's not right to take away oxycodone and give her tramadol and that she is being given the run a round in clinic by having all these interns see pt instead of dr Kem Kays. i did not address any of these issues w/ ms miller as i could not find documentation listing ms miller as contact. Since then chilon has shown me a new source of designated contacts and aid was recently added to pt's chart as to being able to discuss care. The aid was not pleasant and referred to the clinic being"nasty" to pt. She states that the pt needs her oxycodone You may call the pt at her home #

## 2012-06-04 ENCOUNTER — Ambulatory Visit: Payer: Medicare HMO | Admitting: Physical Therapy

## 2012-06-09 ENCOUNTER — Ambulatory Visit: Payer: Medicare HMO | Attending: Internal Medicine | Admitting: Physical Therapy

## 2012-06-09 ENCOUNTER — Telehealth: Payer: Self-pay | Admitting: *Deleted

## 2012-06-09 DIAGNOSIS — IMO0001 Reserved for inherently not codable concepts without codable children: Secondary | ICD-10-CM | POA: Insufficient documentation

## 2012-06-09 DIAGNOSIS — R262 Difficulty in walking, not elsewhere classified: Secondary | ICD-10-CM | POA: Insufficient documentation

## 2012-06-09 DIAGNOSIS — M25562 Pain in left knee: Secondary | ICD-10-CM

## 2012-06-09 NOTE — Telephone Encounter (Signed)
Referral has been made, I received an email from her PT.

## 2012-06-09 NOTE — Telephone Encounter (Signed)
Call from pt requesting appointment with Orthopaedics.  Pt stated that she had spoke with Dr. Kem Kays who was going to refer her to an Orthopaedic Physician.  Pt was informed that a referral has not been made and that Dr. Kem Kays has not as of yet  spoken to the Physical Therapist or received a note as to what has been done in Physical Therapy. And her need for the referral.  Call to the Theaapist this am said that she sent an E mail but had not yet sent a note or a request for the Orthopaedic Physician.  She will send a note to Dr. Kem Kays to review  For the referral.  Pt was called and informed that information from the Therapist is being sent to Dr. Kem Kays for his review.  Angelina Ok, RN 06/09/2012 10:32 AM.

## 2012-06-09 NOTE — Telephone Encounter (Signed)
Kara Mendoza, go ahead and refer her to orthopedic surgery. I will place the request.

## 2012-06-09 NOTE — Telephone Encounter (Signed)
Pt states she has a problem w/ both knees and that will be why she needs the ortho appt per PT

## 2012-06-10 ENCOUNTER — Telehealth: Payer: Self-pay | Admitting: *Deleted

## 2012-06-10 NOTE — Telephone Encounter (Signed)
Call from pt's caregiver Patrice. Said that she had called the pain center at [facility] about an appointment for the patient.  Pain Center stated that more information was requested for the patient before a decision/appointment can be done.  Patrice was given the appointment for the pt to go to Dr. Madelon Lips an Orthopaedic Surgeon that was requested for the pt by PT.  Appointment is scheduled for 06/12/2012 at 2:30 PM.    Call to Pain Management to ask what further information was needed and requested.  Notes from the previous Pain Center- [facility]ag was again faxed to the Pain Center for review.  Angelina Ok, RN 06/10/2012 1:56 PM.

## 2012-06-11 ENCOUNTER — Ambulatory Visit: Payer: Medicare HMO | Admitting: Physical Therapy

## 2012-06-16 ENCOUNTER — Other Ambulatory Visit (HOSPITAL_COMMUNITY): Payer: Self-pay | Admitting: Family Medicine

## 2012-06-16 DIAGNOSIS — Z1231 Encounter for screening mammogram for malignant neoplasm of breast: Secondary | ICD-10-CM

## 2012-06-18 ENCOUNTER — Other Ambulatory Visit: Payer: Self-pay | Admitting: *Deleted

## 2012-06-18 DIAGNOSIS — K219 Gastro-esophageal reflux disease without esophagitis: Secondary | ICD-10-CM

## 2012-06-18 MED ORDER — OMEPRAZOLE 20 MG PO CPDR: 20.0000 mg | DELAYED_RELEASE_CAPSULE | Freq: Two times a day (BID) | ORAL | Status: DC

## 2012-06-19 ENCOUNTER — Telehealth: Payer: Self-pay | Admitting: *Deleted

## 2012-06-19 NOTE — Telephone Encounter (Signed)
Call from [facility] - States they do not have Hyzaar mfg by [employer]. Can they use what they have? [phone] Thanks

## 2012-06-19 NOTE — Telephone Encounter (Signed)
[facility] informed ok to use different mfg w/same components per Dr Kem Kays.

## 2012-06-19 NOTE — Telephone Encounter (Signed)
Yes, as long as she obtains the components of hyzaar (losartan and hydrochlorothiazide). Thanks.

## 2012-07-03 ENCOUNTER — Encounter: Payer: Medicare HMO | Admitting: Internal Medicine

## 2012-07-07 NOTE — Addendum Note (Signed)
Addended by: Dorie Rank E on: 07/07/2012 11:08 AM   Modules accepted: Orders

## 2012-08-01 ENCOUNTER — Ambulatory Visit (HOSPITAL_COMMUNITY)
Admission: RE | Admit: 2012-08-01 | Discharge: 2012-08-01 | Disposition: A | Discharge status: Home / Self Care | Payer: Medicare HMO | Source: Ambulatory Visit | Attending: Family Medicine | Admitting: Family Medicine

## 2012-08-01 DIAGNOSIS — Z1231 Encounter for screening mammogram for malignant neoplasm of breast: Secondary | ICD-10-CM

## 2012-12-15 ENCOUNTER — Ambulatory Visit (INDEPENDENT_AMBULATORY_CARE_PROVIDER_SITE_OTHER): Payer: Medicare HMO | Admitting: Diagnostic Neuroimaging

## 2012-12-15 ENCOUNTER — Encounter (INDEPENDENT_AMBULATORY_CARE_PROVIDER_SITE_OTHER): Payer: Self-pay

## 2012-12-15 ENCOUNTER — Encounter: Payer: Self-pay | Admitting: Diagnostic Neuroimaging

## 2012-12-15 VITALS — BP 113/77 | HR 93 | Temp 99.0°F | Ht 62.0 in | Wt 162.0 lb

## 2012-12-15 DIAGNOSIS — I6930 Unspecified sequelae of cerebral infarction: Secondary | ICD-10-CM

## 2012-12-15 DIAGNOSIS — Z8673 Personal history of transient ischemic attack (TIA), and cerebral infarction without residual deficits: Secondary | ICD-10-CM

## 2012-12-15 NOTE — Patient Instructions (Signed)
Secondary to mechanical fall although patient does have a history of osteoporosis.  - Neurosurgery following; appreciate their recommendations - Bedrest until cleared for ambulation 

## 2012-12-15 NOTE — Progress Notes (Signed)
[facility]  PATIENT: Kara Mendoza DOB: 08-02-1950  REFERRING CLINICIAN: Lowella Grip HISTORY FROM: patient REASON FOR VISIT: new consult   HISTORICAL  CHIEF COMPLAINT:  Chief Complaint  Patient presents with  . Cerebrovascular Accident    HISTORY OF PRESENT ILLNESS:   62 year old right-handed female with diabetes, hypercholesteremia, depression, anxiety, left temporoparietal ischemic infarction in 2008, here for evaluation of residual stroke symptoms.  Patient complains of left arm and left leg pain over past 3 months. She says this is also residual symptoms from her 2008 stroke. However later in her conversation she tells me that this is a within the last 2 months. She denies any right-sided face, arm, leg symptoms. Ever since her stroke she has had some stuttering speech. Review of prior hospital records and imaging studies demonstrate patient was admitted to the hospital in 2008 for confusion and slurred speech problems. MRI of the brain showed left temporoparietal ischemic infarctions, with some infarction in the right primary motor cortex. Additional chronic small vessel ischemic disease noted and normal MRA of the head.  Patient has been to pain management clinic for her left arm and left leg pain but apparently she was dismissed. Patient thinks that she has a "busted blood vessel" in her left wrist with pain radiating up her left arm and across her chest.  REVIEW OF SYSTEMS: Full 14 system review of systems performed and notable only for memory loss confusion headache weakness slurred speech depression anxiety decreased energy change in appetite allergies and sensitivity to pain joint swelling and blurred vision loss of vision cough wheezing incontinence diarrhea incontinence old hearing loss ringing in ears palpitations.  ALLERGIES: Allergies  Allergen Reactions  . Penicillins Hives    HOME MEDICATIONS: Outpatient Prescriptions Prior to Visit  Medication  Sig Dispense Refill  . Diapers & Supplies MISC 1 Package by Does not apply route 3 (three) times daily.  100 each  3  . Multiple Vitamins-Calcium (ONE-A-DAY WOMENS FORMULA PO) Take 1 tablet by mouth daily.      Marland Kitchen omeprazole (PRILOSEC) 20 MG capsule Take 1 capsule (20 mg total) by mouth 2 (two) times daily.  180 capsule  3  . oxyCODONE-acetaminophen (PERCOCET) 10-325 MG per tablet Take 1 tablet by mouth every 6 (six) hours as needed for pain.  56 tablet  0  . pregabalin (LYRICA) 25 MG capsule Take 1 capsule (25 mg total) by mouth 3 (three) times daily.  90 capsule  0  . Elastic Bandages & Supports (KNEE BRACE) MISC 1 Device by Does not apply route daily.  1 each  0  . simvastatin (ZOCOR) 20 MG tablet Take 1 tablet (20 mg total) by mouth at bedtime.  30 tablet  3  . albuterol (PROAIR HFA) 108 (90 BASE) MCG/ACT inhaler Inhale 2 puffs into the lungs every 6 (six) hours as needed for wheezing.  1 Inhaler  5  . dipyridamole-aspirin (AGGRENOX) 200-25 MG per 12 hr capsule Take 1 capsule by mouth 2 (two) times daily.  180 capsule  2  . DULoxetine (CYMBALTA) 60 MG capsule Take 1 capsule (60 mg total) by mouth daily.  90 capsule  1  . losartan-hydrochlorothiazide (HYZAAR) 100-25 MG per tablet Take 1 tablet by mouth daily.  90 tablet  1  . traMADol (ULTRAM) 50 MG tablet Take 1 tablet (50 mg total) by mouth 2 (two) times daily as needed for pain.  30 tablet  0   No facility-administered medications prior to visit.  PAST MEDICAL HISTORY: Past Medical History  Diagnosis Date  . Stroke   . Neuromuscular disorder   . Hypertension   . Hyperlipidemia   . Asthma   . Diabetes mellitus   . Hyperthyroidism   . Esophageal reflux   . Constipation   . Major depressive disorder, recurrent episode, moderate   . Anxiety state, unspecified   . History of fall   . Abnormality of gait   . Chronic pain syndrome   . Tobacco use disorder   . Osteoarthritis   . Dysarthria   . Acute bronchitis     PAST SURGICAL  HISTORY: Past Surgical History  Procedure Laterality Date  . Cesarean section      x3  . Finger surgery      FAMILY HISTORY: Family History  Problem Relation Age of Onset  . Diabetes Sister   . Cancer Father   . Diabetes Brother     SOCIAL HISTORY:  History   Social History  . Marital Status: Single    Spouse Name: N/A    Number of Children: 3  . Years of Education: College   Occupational History  .  Other    Disabled   Social History Main Topics  . Smoking status: Current Every Day Smoker -- 0.20 packs/day for 15 years    Types: Cigarettes  . Smokeless tobacco: Never Used     Comment: Tryin to quit  . Alcohol Use: No  . Drug Use: No  . Sexual Activity: Not on file   Other Topics Concern  . Not on file   Social History Narrative   Patient lives at home with her son.   Caffeine Use: 1 cup every other day     PHYSICAL EXAM  Filed Vitals:   12/15/12 1109  BP: 113/77  Pulse: 93  Temp: 99 F (37.2 C)  TempSrc: Oral  Height: 5\' 2"  (1.575 m)  Weight: 162 lb (73.483 kg)    Not recorded    Body mass index is 29.62 kg/(m^2).  GENERAL EXAM: Patient is in no distress  CARDIOVASCULAR: Regular rate and rhythm, no murmurs, no carotid bruits  NEUROLOGIC: MENTAL STATUS: awake, alert, language fluent, comprehension intact, naming intact CRANIAL NERVE: no papilledema on fundoscopic exam, pupils equal and reactive to light, visual fields full to confrontation, extraocular muscles intact, no nystagmus, facial sensation and strength symmetric, uvula midline, shoulder shrug symmetric, tongue midline. MOTOR: normal bulk and tone, full strength in the RUE, BLE; LEFT HAND, WRIST, SEVERE TENDERNESS TO PALPATION. SENSORY: normal and symmetric to light touch, temperature, vibration COORDINATION: finger-nose-finger, fine finger movements normal REFLEXES: deep tendon reflexes present and symmetric; TRACE AT KNEE AND ANKLES. GAIT/STATION: narrow based gait; SLOW,  ANTALGIC GAIT.    DIAGNOSTIC DATA (LABS, IMAGING, TESTING) - I reviewed patient records, labs, notes, testing and imaging myself where available.  Lab Results  Component Value Date   WBC 10.7* 03/27/2012   HGB 13.8 03/27/2012   HCT 42.0 03/27/2012   MCV 89.9 03/27/2012   PLT 424* 03/27/2012      Component Value Date/Time   NA 140 03/10/2012 1612   K 3.4* 03/10/2012 1612   CL 101 03/10/2012 1612   CO2 30 03/10/2012 1612   GLUCOSE 135* 03/10/2012 1612   BUN 13 03/10/2012 1612   CREATININE 0.81 03/10/2012 1612   CREATININE .8 04/20/2008 1604   CALCIUM 10.3 03/10/2012 1612   PROT 6.5 10/11/2011 1114   ALBUMIN 4.0 10/11/2011 1114   AST 14 10/11/2011 1114  ALT 15 10/11/2011 1114   ALKPHOS 65 10/11/2011 1114   BILITOT 0.4 10/11/2011 1114   GFRNONAA >60 04/20/2008 1604   GFRAA  Value: >60        The eGFR has been calculated using the MDRD equation. This calculation has not been validated in all clinical situations. eGFR's persistently <60 mL/min signify possible Chronic Kidney Disease. 04/20/2008 1604   Lab Results  Component Value Date   CHOL 134 10/11/2011   HDL 44 10/11/2011   LDLCALC 75 10/11/2011   TRIG 75 10/11/2011   CHOLHDL 3.0 10/11/2011   Lab Results  Component Value Date   HGBA1C 5.7 10/11/2011   No results found for this basename: [id number]   Lab Results  Component Value Date   TSH 0.949 10/11/2011    05/18/06 MRI BRAIN 1. Acute/subacute left temporoparietal infarct.  2. Additional area of restricted diffusion extending from the left primary motor cortex.  3. Scattered subcortical T2 hyperintensities bilaterally. These are slightly greater than expected for age. The findings are nonspecific but may be related to chronic microvascular ischemia. Demyelinating process or vasculitis is considered less likely.   05/18/06 MRA HEAD - normal   ASSESSMENT AND PLAN  62 y.o. year old female here with diabetes, hyperkalemia, left MCA branch infarctions in 2008, now with left arm and left leg pain. These are not  residual effects of her prior left brain strokes. In fact the only residual effect on exam today would be possibly her stuttering speech/language difficulty. Her left arm and left leg pain seemed to be musculoskeletal.  PLAN: - consider pain management, orthopedic or rheumatology evaluation - risk factor mgmt per PCP - continue plavix, metformin, simvastatin  Return for return to PCP.    Suanne Marker, MD 12/15/2012, 12:06 PM Certified in Neurology, Neurophysiology and Neuroimaging  [facility] [address] [place], [place] [address] [phone]

## 2013-03-03 ENCOUNTER — Other Ambulatory Visit (HOSPITAL_COMMUNITY): Payer: Self-pay | Admitting: Internal Medicine

## 2013-03-03 DIAGNOSIS — M79609 Pain in unspecified limb: Secondary | ICD-10-CM

## 2013-03-04 ENCOUNTER — Ambulatory Visit (HOSPITAL_COMMUNITY): Payer: PRIVATE HEALTH INSURANCE

## 2013-03-06 ENCOUNTER — Ambulatory Visit (HOSPITAL_COMMUNITY)
Admission: RE | Admit: 2013-03-06 | Discharge: 2013-03-06 | Disposition: A | Discharge status: Home / Self Care | Payer: PRIVATE HEALTH INSURANCE | Source: Ambulatory Visit | Attending: Internal Medicine | Admitting: Internal Medicine

## 2013-03-06 DIAGNOSIS — M79609 Pain in unspecified limb: Secondary | ICD-10-CM

## 2013-03-06 NOTE — Progress Notes (Signed)
*  Preliminary Results* Left upper extremity venous duplex completed. Left upper extremity is negative for deep and superficial vein thrombosis.  Preliminary results discussed with Morrie SheldonAshley of Dr.Avbuere's office.  03/06/2013 10:22 AM  Gertie FeyMichelle Tru Leopard, RVT, RDCS, RDMS

## 2013-03-18 ENCOUNTER — Telehealth: Payer: Self-pay | Admitting: Licensed Clinical Social Worker

## 2013-03-18 NOTE — Telephone Encounter (Signed)
CSW received paperwork from [facility] requesting add'l PCS hours due to memory deficits.  CSW placed call to [facility] stating pt will need to schedule appt as it has been over 90 days.  CSW placed call to Ms. Tresa EndoKelly regarding scheduling.  Pt states she is no longer a patient of [facility] and does not know any of her doctors.  Pt states her new PCP is Avbuere.  Pt states Lowella GripJ. Barr NP is not her PCP either.  CSW will notify front office.

## 2013-04-06 ENCOUNTER — Encounter (HOSPITAL_COMMUNITY): Payer: Self-pay | Admitting: Emergency Medicine

## 2013-04-06 ENCOUNTER — Emergency Department (HOSPITAL_COMMUNITY)
Admission: EM | Admit: 2013-04-06 | Discharge: 2013-04-06 | Disposition: A | Discharge status: Home / Self Care | Payer: Medicare HMO | Attending: Emergency Medicine | Admitting: Emergency Medicine

## 2013-04-06 DIAGNOSIS — IMO0002 Reserved for concepts with insufficient information to code with codable children: Secondary | ICD-10-CM | POA: Insufficient documentation

## 2013-04-06 DIAGNOSIS — Z7902 Long term (current) use of antithrombotics/antiplatelets: Secondary | ICD-10-CM | POA: Insufficient documentation

## 2013-04-06 DIAGNOSIS — E785 Hyperlipidemia, unspecified: Secondary | ICD-10-CM | POA: Insufficient documentation

## 2013-04-06 DIAGNOSIS — M7989 Other specified soft tissue disorders: Secondary | ICD-10-CM | POA: Diagnosis not present

## 2013-04-06 DIAGNOSIS — Z880 Allergy status to penicillin: Secondary | ICD-10-CM | POA: Insufficient documentation

## 2013-04-06 DIAGNOSIS — Z79899 Other long term (current) drug therapy: Secondary | ICD-10-CM | POA: Diagnosis not present

## 2013-04-06 DIAGNOSIS — E119 Type 2 diabetes mellitus without complications: Secondary | ICD-10-CM | POA: Diagnosis not present

## 2013-04-06 DIAGNOSIS — Z8673 Personal history of transient ischemic attack (TIA), and cerebral infarction without residual deficits: Secondary | ICD-10-CM | POA: Diagnosis not present

## 2013-04-06 DIAGNOSIS — R209 Unspecified disturbances of skin sensation: Secondary | ICD-10-CM | POA: Insufficient documentation

## 2013-04-06 DIAGNOSIS — K219 Gastro-esophageal reflux disease without esophagitis: Secondary | ICD-10-CM | POA: Insufficient documentation

## 2013-04-06 DIAGNOSIS — J45909 Unspecified asthma, uncomplicated: Secondary | ICD-10-CM | POA: Insufficient documentation

## 2013-04-06 DIAGNOSIS — M1990 Unspecified osteoarthritis, unspecified site: Secondary | ICD-10-CM | POA: Insufficient documentation

## 2013-04-06 DIAGNOSIS — M79602 Pain in left arm: Secondary | ICD-10-CM

## 2013-04-06 DIAGNOSIS — G8929 Other chronic pain: Secondary | ICD-10-CM | POA: Diagnosis not present

## 2013-04-06 DIAGNOSIS — F331 Major depressive disorder, recurrent, moderate: Secondary | ICD-10-CM | POA: Insufficient documentation

## 2013-04-06 DIAGNOSIS — Z760 Encounter for issue of repeat prescription: Secondary | ICD-10-CM | POA: Insufficient documentation

## 2013-04-06 DIAGNOSIS — I10 Essential (primary) hypertension: Secondary | ICD-10-CM | POA: Insufficient documentation

## 2013-04-06 DIAGNOSIS — Z8669 Personal history of other diseases of the nervous system and sense organs: Secondary | ICD-10-CM | POA: Insufficient documentation

## 2013-04-06 DIAGNOSIS — M79609 Pain in unspecified limb: Secondary | ICD-10-CM | POA: Insufficient documentation

## 2013-04-06 DIAGNOSIS — F17200 Nicotine dependence, unspecified, uncomplicated: Secondary | ICD-10-CM | POA: Insufficient documentation

## 2013-04-06 HISTORY — DX: Other chronic pain: G89.29

## 2013-04-06 HISTORY — DX: Pain in arm, unspecified: M79.603

## 2013-04-06 HISTORY — DX: Headache, unspecified: R51.9

## 2013-04-06 HISTORY — DX: Other fatigue: R53.83

## 2013-04-06 HISTORY — DX: Headache: R51

## 2013-04-06 HISTORY — DX: Pain in leg, unspecified: M79.606

## 2013-04-06 NOTE — ED Notes (Signed)
Pt states that she has been having problems of pain/swelling to left hand/wrist and is traveling up arm.  Palpable pulse.  Pt states she has had tests to r/o blood clots and has had MRI on arm and was told she has nerve damage from playing the tambourine, pt has history of a stroke that has left her with chronic left sided headaches and speech deficits.  Pt needs pain medication refill for her chronic headaches

## 2013-04-06 NOTE — ED Notes (Signed)
Pt. C/o arm pain told she had nerve damage.

## 2013-04-06 NOTE — ED Notes (Signed)
Patient has complaint of pain in left arm and hand, as well as thumb on right hand.  Pain noted on palpation, and left arm is edematous.  Reports pain 10/10, and did not take pain medication today.

## 2013-04-06 NOTE — Discharge Instructions (Signed)
Come for your ultra sound study tomorrow.  Call for a follow up appointment with Dr. Concepcion ElkAvbuere tomorrow. Return if Symptoms worsen.   Take medication as prescribed.

## 2013-04-06 NOTE — ED Provider Notes (Signed)
CSN: [id number]     Arrival date & time 04/06/13  1658 History  This chart was scribed for non-physician practitioner, Mellody Drown, PA-C,working with Laray Anger, DO, by Karle Plumber, ED Scribe.  This patient was seen in room TR07C/TR07C and the patient's care was started at 8:01 PM.  Chief Complaint  Patient presents with  . Arm Pain  . Medication Refill   The history is provided by the patient. No language interpreter was used.   HPI Comments:  Kara Mendoza is a 63 y.o. female, with h/o CVA, who presents to the Emergency Department complaining of worsening severe left arm pain and swelling for over 6 months. She reports numbness and tingling of the area. Pt states the pain is traveling from her wrist up her arm. Pt states she went to see her PCP today, but was unable to be seen, due to being past office hours and was told to come to the ED. She states she has seen different specialist, "6-7 of them", and has had an MRI. She reports warmth to the area and states she applies ice compresses to the area with no relief. She states she puts a "salve" on the arm with no relief. She states she has seen pain management approximately one year ago, and was not pleased with her care. She states she has not been able to follow up with any other pain management. She states she has been taking Percocet 10/325mg  but ran out last week. She states her PCP will not refill it because she needs to follow up with pain management. She denies fever. Pt states her PCP is Dr. Concepcion Elk.   Past Medical History  Diagnosis Date  . Stroke   . Neuromuscular disorder   . Hypertension   . Hyperlipidemia   . Asthma   . Diabetes mellitus   . Hyperthyroidism   . Esophageal reflux   . Constipation   . Major depressive disorder, recurrent episode, moderate   . Anxiety state, unspecified   . History of fall   . Abnormality of gait   . Chronic pain syndrome   . Tobacco use disorder   . Osteoarthritis   .  Dysarthria   . Acute bronchitis    Past Surgical History  Procedure Laterality Date  . Cesarean section      x3  . Finger surgery     Family History  Problem Relation Age of Onset  . Diabetes Sister   . Cancer Father   . Diabetes Brother    History  Substance Use Topics  . Smoking status: Current Every Day Smoker -- 0.20 packs/day for 15 years    Types: Cigarettes  . Smokeless tobacco: Never Used     Comment: Tryin to quit  . Alcohol Use: No   OB History   Grav Para Term Preterm Abortions TAB SAB Ect Mult Living                 Review of Systems  Constitutional: Negative for fever and chills.  Musculoskeletal: Positive for arthralgias and myalgias.  Skin: Negative for color change, pallor, rash and wound.  Neurological: Positive for numbness.  All other systems reviewed and are negative.    Allergies  Penicillins  Home Medications   Current Outpatient Rx  Name  Route  Sig  Dispense  Refill  . albuterol (PROVENTIL HFA;VENTOLIN HFA) 108 (90 BASE) MCG/ACT inhaler   Inhalation   Inhale into the lungs every 6 (six) hours as needed  for wheezing or shortness of breath.         . ASSURE PLATINUM test strip               . clopidogrel (PLAVIX) 75 MG tablet   Oral   Take 1 tablet by mouth daily.         . Diapers & Supplies MISC   Does not apply   1 Package by Does not apply route 3 (three) times daily.   100 each   3   . hydrochlorothiazide (HYDRODIURIL) 50 MG tablet   Oral   Take 50 mg by mouth daily.         Marland Kitchen losartan (COZAAR) 100 MG tablet   Oral   Take 1 tablet by mouth daily.         . metFORMIN (GLUCOPHAGE) 500 MG tablet   Oral   Take 1 tablet by mouth 2 (two) times daily.         . methimazole (TAPAZOLE) 5 MG tablet   Oral   Take 5 mg by mouth daily.         . Multiple Vitamins-Calcium (ONE-A-DAY WOMENS FORMULA PO)   Oral   Take 1 tablet by mouth daily.         Marland Kitchen omeprazole (PRILOSEC) 20 MG capsule   Oral   Take 1  capsule (20 mg total) by mouth 2 (two) times daily.   180 capsule   3   . ONETOUCH DELICA LANCETS 33G MISC               . oxyCODONE-acetaminophen (PERCOCET) 10-325 MG per tablet   Oral   Take 1 tablet by mouth every 6 (six) hours as needed for pain.   56 tablet   0   . predniSONE (DELTASONE) 20 MG tablet   Oral   Take 20 mg by mouth daily with breakfast.         . pregabalin (LYRICA) 25 MG capsule   Oral   Take 1 capsule (25 mg total) by mouth 3 (three) times daily.   90 capsule   0   . senna (SENOKOT) 8.6 MG TABS tablet   Oral   Take 1 tablet by mouth daily as needed for mild constipation.         . sertraline (ZOLOFT) 50 MG tablet   Oral   Take 1 tablet by mouth at bedtime.         Marland Kitchen EXPIRED: simvastatin (ZOCOR) 20 MG tablet   Oral   Take 20 mg by mouth at bedtime. 1/2 tab qd          Triage Vitals: BP 147/65  Pulse 95  Temp(Src) 99.1 F (37.3 C) (Oral)  Resp 20  Ht 5\' 2"  (1.575 m)  Wt 165 lb (74.844 kg)  BMI 30.17 kg/m2  SpO2 99% Physical Exam  Nursing note and vitals reviewed. Constitutional: She is oriented to person, place, and time. She appears well-developed and well-nourished.  HENT:  Head: Normocephalic and atraumatic.  Eyes: EOM are normal.  Neck: Normal range of motion. Neck supple.  Cardiovascular:  Pulses:      Radial pulses are 2+ on the right side, and 2+ on the left side.  Pulmonary/Chest: Effort normal. No respiratory distress.  Musculoskeletal: Normal range of motion. She exhibits edema and tenderness.  Left upper extremity, mild swelling.  Pt reports severe tenderness to light touch to hand and forearm.  The pt screams prior to being touched and applies  more pressure to the limb while resting it on her leg than allows the provider to apply.  Good cap refill. Full ROM.  No signs of cellulitis.   Neurological: She is alert and oriented to person, place, and time.  Skin: Skin is warm and dry.  Psychiatric: She has a normal mood  and affect. Her speech is normal.    ED Course  Procedures (including critical care time) COORDINATION OF CARE: 8:17 PM- Will speak to Dr. Clarene DukeMcManus about appropriate course of treatment. Pt verbalizes understanding and agrees to plan.  8:28 PM- Spoke to Dr. Clarene DukeMcManus and she advised reimaging to rule out DVT.  Medications - No data to display  Labs Review Labs Reviewed - No data to display Imaging Review No results found.   EKG Interpretation None      MDM   Final diagnoses:  Left arm pain   Pt with chronic right arm pain and swelling.  EMR shows Pt had a negative ultrasound on 03/06/2013.  She has also has been to a neurologist and orthopedist for the same issue in the past. Pt flagged for controlled medication caution, she reports she was "fired" from her chronic pain doctor 1 year ago and has not established care with another pain specialist.  Discussed that I will not prescribe her narcotic pain medication at this time and to follow up with her PCP or another pain specialist. No obvious deformity, cellulitis on exam. Due to pts exaggerated response of light touch and moving to avoid evaluation despite multiple attempts and explanation to please let me evaluate the limb, I was unable to evaluate for reflexes in the upper extremities.  Discussed patient history, condition, with Dr. Clarene DukeMcManus, advises repeat US to rule out DVT. US ordered for out-pt tomorrow. Directions given to pt. Discussed treatment plan with the patient. Return precautions given. Reports understanding and no other concerns at this time.  Patient is stable for discharge at this time.   I personally performed the services described in this documentation, which was scribed in my presence. The recorded information has been reviewed and is accurate.    Clabe SealLauren M Breon Diss, PA-C 04/09/13 [phone]

## 2013-04-07 ENCOUNTER — Ambulatory Visit (HOSPITAL_COMMUNITY): Payer: Medicare HMO | Attending: Internal Medicine

## 2013-04-09 ENCOUNTER — Encounter (HOSPITAL_COMMUNITY): Payer: Self-pay | Admitting: Emergency Medicine

## 2013-04-10 NOTE — ED Provider Notes (Signed)
Medical screening examination/treatment/procedure(s) were performed by non-physician practitioner and as supervising physician I was immediately available for consultation/collaboration.   EKG Interpretation None        Laray AngerKathleen M Godric Lavell, DO 04/10/13 1438

## 2013-06-01 ENCOUNTER — Other Ambulatory Visit (HOSPITAL_COMMUNITY): Payer: Self-pay | Admitting: Internal Medicine

## 2013-06-01 DIAGNOSIS — Z1231 Encounter for screening mammogram for malignant neoplasm of breast: Secondary | ICD-10-CM

## 2013-07-31 ENCOUNTER — Other Ambulatory Visit (HOSPITAL_COMMUNITY): Payer: Self-pay | Admitting: Internal Medicine

## 2013-08-03 ENCOUNTER — Ambulatory Visit (HOSPITAL_COMMUNITY): Payer: Medicare HMO

## 2013-08-04 ENCOUNTER — Ambulatory Visit (HOSPITAL_COMMUNITY)
Admission: RE | Admit: 2013-08-04 | Discharge: 2013-08-04 | Disposition: A | Discharge status: Home / Self Care | Payer: Medicare HMO | Source: Ambulatory Visit | Attending: Internal Medicine | Admitting: Internal Medicine

## 2013-08-04 DIAGNOSIS — Z1231 Encounter for screening mammogram for malignant neoplasm of breast: Secondary | ICD-10-CM | POA: Insufficient documentation

## 2013-08-09 ENCOUNTER — Inpatient Hospital Stay (HOSPITAL_COMMUNITY): Payer: Medicare HMO

## 2013-08-09 ENCOUNTER — Emergency Department (HOSPITAL_COMMUNITY): Payer: Medicare HMO

## 2013-08-09 ENCOUNTER — Encounter (HOSPITAL_COMMUNITY): Payer: Self-pay | Admitting: Emergency Medicine

## 2013-08-09 ENCOUNTER — Inpatient Hospital Stay (HOSPITAL_COMMUNITY)
Admission: EM | Admit: 2013-08-09 | Discharge: 2013-08-14 | DRG: 041 | Disposition: A | Discharge status: Rehabilitation Facility | Payer: Medicare HMO | Attending: Internal Medicine | Admitting: Internal Medicine

## 2013-08-09 DIAGNOSIS — I6340 Cerebral infarction due to embolism of unspecified cerebral artery: Secondary | ICD-10-CM | POA: Diagnosis present

## 2013-08-09 DIAGNOSIS — R471 Dysarthria and anarthria: Secondary | ICD-10-CM | POA: Diagnosis present

## 2013-08-09 DIAGNOSIS — E0590 Thyrotoxicosis, unspecified without thyrotoxic crisis or storm: Secondary | ICD-10-CM | POA: Diagnosis present

## 2013-08-09 DIAGNOSIS — K5900 Constipation, unspecified: Secondary | ICD-10-CM | POA: Diagnosis present

## 2013-08-09 DIAGNOSIS — Z79899 Other long term (current) drug therapy: Secondary | ICD-10-CM

## 2013-08-09 DIAGNOSIS — E785 Hyperlipidemia, unspecified: Secondary | ICD-10-CM | POA: Diagnosis present

## 2013-08-09 DIAGNOSIS — F17200 Nicotine dependence, unspecified, uncomplicated: Secondary | ICD-10-CM | POA: Diagnosis present

## 2013-08-09 DIAGNOSIS — J45998 Other asthma: Secondary | ICD-10-CM

## 2013-08-09 DIAGNOSIS — J45909 Unspecified asthma, uncomplicated: Secondary | ICD-10-CM | POA: Diagnosis present

## 2013-08-09 DIAGNOSIS — F331 Major depressive disorder, recurrent, moderate: Secondary | ICD-10-CM | POA: Diagnosis present

## 2013-08-09 DIAGNOSIS — M722 Plantar fascial fibromatosis: Secondary | ICD-10-CM

## 2013-08-09 DIAGNOSIS — R531 Weakness: Secondary | ICD-10-CM

## 2013-08-09 DIAGNOSIS — Z8673 Personal history of transient ischemic attack (TIA), and cerebral infarction without residual deficits: Secondary | ICD-10-CM

## 2013-08-09 DIAGNOSIS — R4701 Aphasia: Secondary | ICD-10-CM

## 2013-08-09 DIAGNOSIS — F411 Generalized anxiety disorder: Secondary | ICD-10-CM | POA: Diagnosis present

## 2013-08-09 DIAGNOSIS — E1142 Type 2 diabetes mellitus with diabetic polyneuropathy: Secondary | ICD-10-CM | POA: Diagnosis present

## 2013-08-09 DIAGNOSIS — R269 Unspecified abnormalities of gait and mobility: Secondary | ICD-10-CM

## 2013-08-09 DIAGNOSIS — Z833 Family history of diabetes mellitus: Secondary | ICD-10-CM | POA: Diagnosis not present

## 2013-08-09 DIAGNOSIS — G5600 Carpal tunnel syndrome, unspecified upper limb: Secondary | ICD-10-CM | POA: Diagnosis present

## 2013-08-09 DIAGNOSIS — R05 Cough: Secondary | ICD-10-CM

## 2013-08-09 DIAGNOSIS — Z886 Allergy status to analgesic agent status: Secondary | ICD-10-CM

## 2013-08-09 DIAGNOSIS — Z602 Problems related to living alone: Secondary | ICD-10-CM

## 2013-08-09 DIAGNOSIS — R002 Palpitations: Secondary | ICD-10-CM | POA: Diagnosis present

## 2013-08-09 DIAGNOSIS — Z7902 Long term (current) use of antithrombotics/antiplatelets: Secondary | ICD-10-CM

## 2013-08-09 DIAGNOSIS — I10 Essential (primary) hypertension: Secondary | ICD-10-CM | POA: Diagnosis present

## 2013-08-09 DIAGNOSIS — G894 Chronic pain syndrome: Secondary | ICD-10-CM | POA: Diagnosis present

## 2013-08-09 DIAGNOSIS — E1149 Type 2 diabetes mellitus with other diabetic neurological complication: Secondary | ICD-10-CM | POA: Diagnosis present

## 2013-08-09 DIAGNOSIS — R51 Headache: Secondary | ICD-10-CM

## 2013-08-09 DIAGNOSIS — Z794 Long term (current) use of insulin: Secondary | ICD-10-CM | POA: Diagnosis not present

## 2013-08-09 DIAGNOSIS — Z880 Allergy status to penicillin: Secondary | ICD-10-CM

## 2013-08-09 DIAGNOSIS — K219 Gastro-esophageal reflux disease without esophagitis: Secondary | ICD-10-CM | POA: Diagnosis present

## 2013-08-09 DIAGNOSIS — I639 Cerebral infarction, unspecified: Secondary | ICD-10-CM

## 2013-08-09 DIAGNOSIS — G609 Hereditary and idiopathic neuropathy, unspecified: Secondary | ICD-10-CM

## 2013-08-09 DIAGNOSIS — R053 Chronic cough: Secondary | ICD-10-CM

## 2013-08-09 DIAGNOSIS — I6350 Cerebral infarction due to unspecified occlusion or stenosis of unspecified cerebral artery: Secondary | ICD-10-CM

## 2013-08-09 DIAGNOSIS — R29898 Other symptoms and signs involving the musculoskeletal system: Secondary | ICD-10-CM | POA: Diagnosis present

## 2013-08-09 DIAGNOSIS — I6389 Other cerebral infarction: Secondary | ICD-10-CM

## 2013-08-09 DIAGNOSIS — Z720 Tobacco use: Secondary | ICD-10-CM

## 2013-08-09 LAB — CBC
HCT: 43.3 % (ref 36.0–46.0)
Hemoglobin: 14.9 g/dL (ref 12.0–15.0)
MCH: 29.7 pg (ref 26.0–34.0)
MCHC: 34.4 g/dL (ref 30.0–36.0)
MCV: 86.4 fL (ref 78.0–100.0)
Platelets: 343 10*3/uL (ref 150–400)
RBC: 5.01 MIL/uL (ref 3.87–5.11)
RDW: 12.5 % (ref 11.5–15.5)
WBC: 9.6 10*3/uL (ref 4.0–10.5)

## 2013-08-09 LAB — COMPREHENSIVE METABOLIC PANEL
ALT: 16 U/L (ref 0–35)
AST: 14 U/L (ref 0–37)
Albumin: 4.1 g/dL (ref 3.5–5.2)
Alkaline Phosphatase: 69 U/L (ref 39–117)
Anion gap: 13 (ref 5–15)
BUN: 14 mg/dL (ref 6–23)
CO2: 26 mEq/L (ref 19–32)
Calcium: 10.2 mg/dL (ref 8.4–10.5)
Chloride: 101 mEq/L (ref 96–112)
Creatinine, Ser: 0.8 mg/dL (ref 0.50–1.10)
GFR calc Af Amer: 88 mL/min — ABNORMAL LOW (ref >90)
GFR calc non Af Amer: 76 mL/min — ABNORMAL LOW (ref >90)
Glucose, Bld: 109 mg/dL — ABNORMAL HIGH (ref 70–99)
Potassium: 4.1 mEq/L (ref 3.7–5.3)
Sodium: 140 mEq/L (ref 137–147)
Total Bilirubin: 0.6 mg/dL (ref 0.3–1.2)
Total Protein: 7.5 g/dL (ref 6.0–8.3)

## 2013-08-09 LAB — URINALYSIS, ROUTINE W REFLEX MICROSCOPIC
Bilirubin Urine: NEGATIVE
Glucose, UA: NEGATIVE mg/dL
Hgb urine dipstick: NEGATIVE
Ketones, ur: NEGATIVE mg/dL
Nitrite: NEGATIVE
Protein, ur: NEGATIVE mg/dL
Specific Gravity, Urine: 1.01 (ref 1.005–1.030)
Urobilinogen, UA: 0.2 mg/dL (ref 0.0–1.0)
pH: 6 (ref 5.0–8.0)

## 2013-08-09 LAB — URINE MICROSCOPIC-ADD ON

## 2013-08-09 LAB — RAPID URINE DRUG SCREEN, HOSP PERFORMED
Amphetamines: NOT DETECTED
Barbiturates: NOT DETECTED
Benzodiazepines: NOT DETECTED
Cocaine: NOT DETECTED
Opiates: NOT DETECTED
Tetrahydrocannabinol: NOT DETECTED

## 2013-08-09 LAB — TSH: TSH: 3.4 u[IU]/mL (ref 0.350–4.500)

## 2013-08-09 LAB — HEMOGLOBIN A1C
Hgb A1c MFr Bld: 5.8 % — ABNORMAL HIGH (ref <5.7)
Mean Plasma Glucose: 120 mg/dL — ABNORMAL HIGH (ref <117)

## 2013-08-09 LAB — GLUCOSE, CAPILLARY: Glucose-Capillary: 96 mg/dL (ref 70–99)

## 2013-08-09 LAB — TROPONIN I: Troponin I: <0.3 ng/mL (ref <0.30)

## 2013-08-09 LAB — PROTIME-INR
INR: 0.98 (ref 0.00–1.49)
Prothrombin Time: 13 seconds (ref 11.6–15.2)

## 2013-08-09 MED ORDER — HYDROCHLOROTHIAZIDE 12.5 MG PO CAPS: 12.5000 mg | ORAL_CAPSULE | Freq: Every day | ORAL | Status: DC

## 2013-08-09 MED ORDER — SIMVASTATIN 5 MG PO TABS
10.0000 mg | ORAL_TABLET | Freq: Every day | ORAL | Status: DC
  Filled 2013-08-09: qty 1

## 2013-08-09 MED ORDER — OXYCODONE-ACETAMINOPHEN 5-325 MG PO TABS
1.0000 | ORAL_TABLET | Freq: Three times a day (TID) | ORAL | Status: DC | PRN
  Administered 2013-08-09 – 2013-08-13 (×7): 1 via ORAL
  Filled 2013-08-09 (×7): qty 1

## 2013-08-09 MED ORDER — ZOLPIDEM TARTRATE 5 MG PO TABS
5.0000 mg | ORAL_TABLET | Freq: Every evening | ORAL | Status: DC | PRN
  Administered 2013-08-09 – 2013-08-11 (×3): 5 mg via ORAL
  Filled 2013-08-09 (×3): qty 1

## 2013-08-09 MED ORDER — LOSARTAN POTASSIUM-HCTZ 100-12.5 MG PO TABS: 1.0000 | ORAL_TABLET | Freq: Every day | ORAL | Status: DC

## 2013-08-09 MED ORDER — METHIMAZOLE 5 MG PO TABS
5.0000 mg | ORAL_TABLET | Freq: Every day | ORAL | Status: DC
  Administered 2013-08-09 – 2013-08-14 (×6): 5 mg via ORAL
  Filled 2013-08-09 (×6): qty 1

## 2013-08-09 MED ORDER — CLOPIDOGREL BISULFATE 75 MG PO TABS
75.0000 mg | ORAL_TABLET | Freq: Every day | ORAL | Status: DC
  Administered 2013-08-09 – 2013-08-14 (×6): 75 mg via ORAL
  Filled 2013-08-09 (×7): qty 1

## 2013-08-09 MED ORDER — ENOXAPARIN SODIUM 40 MG/0.4ML ~~LOC~~ SOLN
40.0000 mg | SUBCUTANEOUS | Status: DC
  Administered 2013-08-09 – 2013-08-13 (×5): 40 mg via SUBCUTANEOUS
  Filled 2013-08-09 (×7): qty 0.4

## 2013-08-09 MED ORDER — STROKE: EARLY STAGES OF RECOVERY BOOK
Freq: Once | Status: AC
  Administered 2013-08-09: 19:00:00
  Filled 2013-08-09: qty 1

## 2013-08-09 MED ORDER — SIMVASTATIN 20 MG PO TABS
20.0000 mg | ORAL_TABLET | Freq: Every day | ORAL | Status: DC
  Administered 2013-08-09: 20 mg via ORAL
  Filled 2013-08-09: qty 1

## 2013-08-09 MED ORDER — SENNOSIDES-DOCUSATE SODIUM 8.6-50 MG PO TABS: 1.0000 | ORAL_TABLET | Freq: Every evening | ORAL | Status: DC | PRN

## 2013-08-09 MED ORDER — INSULIN ASPART 100 UNIT/ML ~~LOC~~ SOLN
0.0000 [IU] | Freq: Three times a day (TID) | SUBCUTANEOUS | Status: DC
  Administered 2013-08-13: 2 [IU] via SUBCUTANEOUS

## 2013-08-09 MED ORDER — INSULIN ASPART 100 UNIT/ML ~~LOC~~ SOLN: 0.0000 [IU] | Freq: Every day | SUBCUTANEOUS | Status: DC

## 2013-08-09 MED ORDER — IOHEXOL 350 MG/ML SOLN
100.0000 mL | Freq: Once | INTRAVENOUS | Status: AC | PRN
  Administered 2013-08-09: 100 mL via INTRAVENOUS

## 2013-08-09 MED ORDER — LOSARTAN POTASSIUM 50 MG PO TABS: 100.0000 mg | ORAL_TABLET | Freq: Every day | ORAL | Status: DC

## 2013-08-09 NOTE — ED Notes (Signed)
Pt is able to ambulate but gait appears a bit wobbly. MD present at bedside.

## 2013-08-09 NOTE — ED Notes (Signed)
Patient transported to CT 

## 2013-08-09 NOTE — H&P (Signed)
[facility] History and Physical  Kara HammockSheila L Blowers ZHY:[id number] DOB: November 23, 1950 DOA: 08/09/2013  Referring physician:  PCP: Dorrene GermanAVBUERE,EDWIN A, MD   Chief Complaint: Slurred Speech  HPI: Kara Mendoza is a 63 y.o. female with a past medical history of a left temporal parietal infarction back in 2008 at which time she presented with expressive aphasia, has history of hypertension, dyslipidemia, tobacco abuse as well, who presents to the emergency room with complaints of dysarthria. Her daughter present at bedside who helped provide history. Patient having difficulties getting her words out with ongoing dysarthria. Daughter states that she last spoke to her mother on Friday which was the last time she could confirm that she was normal. Patient thinks that symptoms may have started today between 12 and 1:00 in the afternoon while at home, that came on with left upper extremity weakness. She lives alone, family members could not confirm exact time symptoms started. They called her this afternoon around 2 pm when they noted her speech impairment, having difficulties understanding her on the telephone. she is currently on Plavix therapy. A CT scan of brain performed in the emergency room showed stable chronic left MCA distribution infarction without acute intracranial abnormality. Case was discussed with Dr. Thad Rangereynolds of neurology who recommended [facility] and treatment. Prior to transfer she recommended obtaining a CTA of head and neck.                                                                                                                                                                                                                                                                 Review of Systems:  Difficult to obtain reliable review of symptoms with persistent dysarthria   Past Medical History  Diagnosis Date  . Stroke   .  Neuromuscular disorder   . Hypertension   . Hyperlipidemia   . Asthma   . Diabetes mellitus   . Hyperthyroidism   . Esophageal reflux   . Constipation   . Major depressive disorder, recurrent episode, moderate   . Anxiety state, unspecified   . History of fall   . Abnormality of gait   . Chronic pain syndrome   . Tobacco use disorder   .  Osteoarthritis   . Dysarthria   . Acute bronchitis   . Chronic arm pain 2008    left  . Chronic leg pain 2008    left  . Fatigue   . Chronic headache    Past Surgical History  Procedure Laterality Date  . Cesarean section      x3  . Finger surgery     Social History:  reports that she has been smoking Cigarettes.  She has a 3 pack-year smoking history. She has never used smokeless tobacco. She reports that she does not drink alcohol or use illicit drugs.  Allergies  Allergen Reactions  . Aspirin Other (See Comments)    Possibly itching and "feeling little weird"  . Penicillins Hives    Family History  Problem Relation Age of Onset  . Diabetes Sister   . Cancer Father   . Diabetes Brother      Prior to Admission medications   Medication Sig Start Date End Date Taking? Authorizing Provider  albuterol (PROVENTIL HFA;VENTOLIN HFA) 108 (90 BASE) MCG/ACT inhaler Inhale 3 puffs into the lungs every 6 (six) hours as needed for wheezing or shortness of breath.     Historical Provider, MD  clopidogrel (PLAVIX) 75 MG tablet Take 1 tablet by mouth daily. 11/12/12   Historical Provider, MD  hydrochlorothiazide (HYDRODIURIL) 50 MG tablet Take 50 mg by mouth daily.    Historical Provider, MD  losartan (COZAAR) 100 MG tablet Take 1 tablet by mouth daily. 11/12/12   Historical Provider, MD  losartan-hydrochlorothiazide (HYZAAR) 100-12.5 MG per tablet Take 1 tablet by mouth daily.    Historical Provider, MD  metFORMIN (GLUCOPHAGE) 500 MG tablet Take 1 tablet by mouth 2 (two) times daily. 11/12/12   Historical Provider, MD  methimazole (TAPAZOLE) 5 MG  tablet Take 5 mg by mouth daily.    Historical Provider, MD  Multiple Vitamins-Calcium (ONE-A-DAY WOMENS FORMULA PO) Take 1 tablet by mouth daily.    Historical Provider, MD  sennosides-docusate sodium (SENOKOT-S) 8.6-50 MG tablet Take 1 tablet by mouth daily.    Historical Provider, MD  sertraline (ZOLOFT) 50 MG tablet Take 1 tablet by mouth at bedtime. 11/12/12   Historical Provider, MD  simvastatin (ZOCOR) 20 MG tablet Take 10 mg by mouth at bedtime. 1/2 tab qd 12/24/11 04/06/13  Jonah Blue, DO  zolpidem (AMBIEN) 5 MG tablet Take 5 mg by mouth at bedtime.    Historical Provider, MD   Physical Exam: Filed Vitals:   08/09/13 1608  BP: 169/98  Pulse: 73  Temp: 99.1 F (37.3 C)  Resp: 22    BP 169/98  Pulse 73  Temp(Src) 99.1 F (37.3 C) (Oral)  Resp 22  SpO2 99%  General:  Dysarthric, in no acute distress, awake and alert is able to follow commands Eyes: PERRL, normal lids, irises & conjunctiva ENT: grossly normal hearing, lips & tongue Neck: no LAD, masses or thyromegaly Cardiovascular: RRR, no m/r/g. No LE edema. Telemetry: SR, no arrhythmias  Respiratory: CTA bilaterally, no w/r/r. Normal respiratory effort. Abdomen: soft, ntnd Skin: no rash or induration seen on limited exam Musculoskeletal: grossly normal tone BUE/BLE Psychiatric: grossly normal mood and affect, speech fluent and appropriate Neurologic: On my neurologic exam she is dysarthric, with obvious difficulties getting her words out. There may be a mild facial droop, No tongue deviation or alteration to sensation of hemiface. She had 4/5 muscle strength to her left upper extremity, 5/5 muscle to the rest of her extremities. 2+ DTR's bilaterally, no  alteration to sensation.           Labs on Admission:  Basic Metabolic Panel:  Recent Labs Lab 08/09/13 1630  NA 140  K 4.1  CL 101  CO2 26  GLUCOSE 109*  BUN 14  CREATININE 0.80  CALCIUM 10.2   Liver Function Tests:  Recent Labs Lab 08/09/13 1630    AST 14  ALT 16  ALKPHOS 69  BILITOT 0.6  PROT 7.5  ALBUMIN 4.1   No results found for this basename: LIPASE, AMYLASE,  in the last 168 hours No results found for this basename: AMMONIA,  in the last 168 hours CBC:  Recent Labs Lab 08/09/13 1630  WBC 9.6  HGB 14.9  HCT 43.3  MCV 86.4  PLT 343   Cardiac Enzymes:  Recent Labs Lab 08/09/13 1630  TROPONINI <0.30    BNP (last 3 results) No results found for this basename: PROBNP,  in the last 8760 hours CBG: No results found for this basename: GLUCAP,  in the last 168 hours  Radiological Exams on Admission: Ct Head Wo Contrast  08/09/2013   CLINICAL DATA:  New expressive aphasia, hx of CVA in the past  EXAM: CT HEAD WITHOUT CONTRAST  TECHNIQUE: Contiguous axial images were obtained from the base of the skull through the vertex without intravenous contrast.  COMPARISON:  Head CT 04/20/2008  FINDINGS: Stable area of chronic left temporoparietal infarction. No acute intracranial abnormality. Specifically, no hemorrhage, hydrocephalus, mass lesion, acute infarction, or significant intracranial injury. No acute calvarial abnormality. Visualized paranasal sinuses and mastoid air cells are patent.  IMPRESSION: Stable chronic left MCA distribution infarction. No acute intracranial abnormality.   Electronically Signed   By: Salome HolmesHector  Cooper M.D.   On: 08/09/2013 17:05    EKG: Independently reviewed. Sinus Rhythm  Assessment/Plan Principal Problem:   CVA (cerebral infarction) Active Problems:   Dysarthria   Hyperlipidemia   Hypertension   GERD (gastroesophageal reflux disease)   1. Acute CVA. Patient with history of hypertension, dyslipidemia, having a left temporoparietal CVA back in 2008, presenting with dysarthria and left upper extremity weakness. Initial CT scan of brain did not reveal acute intracranial modalities. Patient had been on Plavix therapy prior to this presentation. I discussed case with Dr. Thad Rangereynolds of neurology  from the emergency room who recommended [facility] evaluation and treatment. Prior to transfer she recommended a stat CTA of head and neck. Will place patient on CVA protocol, neuro checks, physical therapy, occupational therapy, speech pathology consultations. For now we'll continue Plavix therapy until assessed by neurology. Followup on MRA/MRI of brain, carotid Dopplers and transthoracic echocardiogram.  2. Hypertension. Will hold antihypertensive agents to allow for permissive hypertension favoring cerebral perfusion given in setting of acute CVA. 3. Dyslipidemia. Check a fasting lipid panel, continue statin therapy 4. Type 2 diabetes mellitus. Discontinue metformin for now, perform Accu-Cheks q. a.c. and each bedtime with sliding scale coverage 5. Hyperthyroidism. Will continue Methimazole, check a TSH 6. DVT prophylaxis. Lovenox   Code Status: Full Code Family Communication: Spoke with her Daughter Disposition Plan: Anticipate she may require greater than 2 nights hospitalization  Time spent: 70 min  Jeralyn BennettZAMORA, [place] [employer] [facility] Pager [phone]  **Disclaimer: This note may have been dictated with voice recognition software. Similar sounding words can inadvertently be transcribed and this note may contain transcription errors which may not have been corrected upon publication of note.**

## 2013-08-09 NOTE — ED Notes (Signed)
Pt here from home with c/o of slurred speech, daughter states that she knows what she wants to say but can't get it out. Last seen normal was Friday. Hx of stroke before.

## 2013-08-09 NOTE — Progress Notes (Signed)
Patient admitted from [facility]   To room 4N27 oriented to room family members at bedside.

## 2013-08-09 NOTE — ED Provider Notes (Signed)
CSN: [id number]     Arrival date & time 08/09/13  1600 History   First MD Initiated Contact with Patient 08/09/13 1615     Chief Complaint  Patient presents with  . Aphasia  . Altered Mental Status     (Consider location/radiation/quality/duration/timing/severity/associated sxs/prior Treatment) Patient is a 63 y.o. female presenting with neurologic complaint. History provided by: daughter.  Neurologic Problem This is a new problem. Episode onset: unknown. The problem occurs constantly. The problem has not changed since onset.Pertinent negatives include no chest pain and no shortness of breath. Nothing aggravates the symptoms. Nothing relieves the symptoms. She has tried nothing for the symptoms. The treatment provided no relief.    Past Medical History  Diagnosis Date  . Stroke   . Neuromuscular disorder   . Hypertension   . Hyperlipidemia   . Asthma   . Diabetes mellitus   . Hyperthyroidism   . Esophageal reflux   . Constipation   . Major depressive disorder, recurrent episode, moderate   . Anxiety state, unspecified   . History of fall   . Abnormality of gait   . Chronic pain syndrome   . Tobacco use disorder   . Osteoarthritis   . Dysarthria   . Acute bronchitis   . Chronic arm pain 2008    left  . Chronic leg pain 2008    left  . Fatigue   . Chronic headache    Past Surgical History  Procedure Laterality Date  . Cesarean section      x3  . Finger surgery     Family History  Problem Relation Age of Onset  . Diabetes Sister   . Cancer Father   . Diabetes Brother    History  Substance Use Topics  . Smoking status: Current Every Day Smoker -- 0.20 packs/day for 15 years    Types: Cigarettes  . Smokeless tobacco: Never Used     Comment: Tryin to quit  . Alcohol Use: No   OB History   Grav Para Term Preterm Abortions TAB SAB Ect Mult Living                 Review of Systems  Constitutional: Negative for fatigue.  HENT: Negative for congestion and  drooling.   Eyes: Negative for pain.  Respiratory: Negative for cough and shortness of breath.   Cardiovascular: Negative for chest pain.  Gastrointestinal: Negative for diarrhea.  Genitourinary: Negative for dysuria and hematuria.  Musculoskeletal: Negative for back pain, gait problem and neck pain.  Skin: Negative for color change.  Neurological: Negative for dizziness.  Hematological: Negative for adenopathy.  Psychiatric/Behavioral: Negative for behavioral problems.  All other systems reviewed and are negative.     Allergies  Aspirin and Penicillins  Home Medications   Prior to Admission medications   Medication Sig Start Date End Date Taking? Authorizing Provider  albuterol (PROVENTIL HFA;VENTOLIN HFA) 108 (90 BASE) MCG/ACT inhaler Inhale 3 puffs into the lungs every 6 (six) hours as needed for wheezing or shortness of breath.     Historical Provider, MD  clopidogrel (PLAVIX) 75 MG tablet Take 1 tablet by mouth daily. 11/12/12   Historical Provider, MD  hydrochlorothiazide (HYDRODIURIL) 50 MG tablet Take 50 mg by mouth daily.    Historical Provider, MD  losartan (COZAAR) 100 MG tablet Take 1 tablet by mouth daily. 11/12/12   Historical Provider, MD  losartan-hydrochlorothiazide (HYZAAR) 100-12.5 MG per tablet Take 1 tablet by mouth daily.    Historical Provider, MD  metFORMIN (GLUCOPHAGE) 500 MG tablet Take 1 tablet by mouth 2 (two) times daily. 11/12/12   Historical Provider, MD  methimazole (TAPAZOLE) 5 MG tablet Take 5 mg by mouth daily.    Historical Provider, MD  Multiple Vitamins-Calcium (ONE-A-DAY WOMENS FORMULA PO) Take 1 tablet by mouth daily.    Historical Provider, MD  sennosides-docusate sodium (SENOKOT-S) 8.6-50 MG tablet Take 1 tablet by mouth daily.    Historical Provider, MD  sertraline (ZOLOFT) 50 MG tablet Take 1 tablet by mouth at bedtime. 11/12/12   Historical Provider, MD  simvastatin (ZOCOR) 20 MG tablet Take 10 mg by mouth at bedtime. 1/2 tab qd 12/24/11  04/06/13  Jonah BlueAlejandro Paya, DO  zolpidem (AMBIEN) 5 MG tablet Take 5 mg by mouth at bedtime.    Historical Provider, MD   BP 169/98  Pulse 73  Temp(Src) 99.1 F (37.3 C) (Oral)  Resp 22  SpO2 99% Physical Exam  Nursing note and vitals reviewed. Constitutional: She is oriented to person, place, and time. She appears well-developed and well-nourished.  HENT:  Head: Normocephalic.  Mouth/Throat: No oropharyngeal exudate.  Eyes: Conjunctivae and EOM are normal. Pupils are equal, round, and reactive to light.  Neck: Normal range of motion. Neck supple.  Cardiovascular: Normal rate, regular rhythm, normal heart sounds and intact distal pulses.  Exam reveals no gallop and no friction rub.   No murmur heard. Pulmonary/Chest: Effort normal and breath sounds normal. No respiratory distress. She has no wheezes.  Abdominal: Soft. Bowel sounds are normal. There is no tenderness. There is no rebound and no guarding.  Musculoskeletal: Normal range of motion. She exhibits no edema and no tenderness.  2+ distal pulses.  Tenderness to palpation of the left upper arm diffusely.  Neurological: She is alert and oriented to person, place, and time.  alert, oriented x1 speech: non-sensical memory: intact grossly cranial nerves II-XII: mild droop at the corner of the right side of the mouth w/ smiling, pt will not move her tongue left or right, CN's otherwise appear intact motor strength: full proximally and distally no involuntary movements or tremors sensation: intact to light touch diffusely  cerebellar: would not perform finger to nose or heel to shin tasks gait: Able to ambulate forwards with small shuffling steps with minimal assistance.   Skin: Skin is warm and dry.  Psychiatric: She has a normal mood and affect. Her behavior is normal.    ED Course  Procedures (including critical care time) Labs Review Labs Reviewed  COMPREHENSIVE METABOLIC PANEL - Abnormal; Notable for the following:     Glucose, Bld 109 (*)    GFR calc non Af Amer 76 (*)    GFR calc Af Amer 88 (*)    All other components within normal limits  HEMOGLOBIN A1C - Abnormal; Notable for the following:    Hemoglobin A1C 5.8 (*)    Mean Plasma Glucose 120 (*)    All other components within normal limits  URINALYSIS, ROUTINE W REFLEX MICROSCOPIC - Abnormal; Notable for the following:    Leukocytes, UA LARGE (*)    All other components within normal limits  HEMOGLOBIN A1C - Abnormal; Notable for the following:    Hemoglobin A1C 5.8 (*)    Mean Plasma Glucose 120 (*)    All other components within normal limits  BASIC METABOLIC PANEL - Abnormal; Notable for the following:    Potassium 3.5 (*)    Glucose, Bld 142 (*)    GFR calc non Af Amer 89 (*)  All other components within normal limits  LIPID PANEL - Abnormal; Notable for the following:    Cholesterol 208 (*)    LDL Cholesterol 128 (*)    All other components within normal limits  GLUCOSE, CAPILLARY - Abnormal; Notable for the following:    Glucose-Capillary 106 (*)    All other components within normal limits  GLUCOSE, CAPILLARY - Abnormal; Notable for the following:    Glucose-Capillary 105 (*)    All other components within normal limits  MRSA PCR SCREENING  URINE RAPID DRUG SCREEN (HOSP PERFORMED)  CBC  PROTIME-INR  TROPONIN I  URINE MICROSCOPIC-ADD ON  CBC  TSH  GLUCOSE, CAPILLARY  GLUCOSE, CAPILLARY    Imaging Review Ct Angio Head W/cm &/or Wo Cm  08/09/2013   CLINICAL DATA:  exprssive aphasia  EXAM: CT ANGIOGRAPHY HEAD AND NECK  TECHNIQUE: Multidetector CT imaging of the head and neck was performed using the standard protocol during bolus administration of intravenous contrast. Multiplanar CT image reconstructions and MIPs were obtained to evaluate the vascular anatomy. Carotid stenosis measurements (when applicable) are obtained utilizing NASCET criteria, using the distal internal carotid diameter as the denominator.  CONTRAST:   OMNIPAQUE IOHEXOL 350 MG/ML SOLN  COMPARISON:  Head CT same day.  MRI 05/17/2006.  FINDINGS: CTA HEAD FINDINGS  Both internal carotid arteries are widely patent through the siphon regions. No supra clinoid stenosis. The anterior and middle cerebral vessels appear normal without proximal stenosis, aneurysm or vascular malformation. Both vertebral arteries are patent to the basilar with the left being a very small vessel. No basilar stenosis. Posterior circulation branch vessels appear patent and normal.  Intracranial venous structures are patent.  Chronic ischemic changes are seen at the temporoparietal junction region on the left.  Review of the MIP images confirms the above findings.  CTA NECK FINDINGS  Branching pattern of the brachiocephalic vessels from the arch is normal. No origin stenoses. Lung apices are clear.  Both common carotid arteries are widely patent to their respective bifurcation. Both carotid bifurcations appear normal without evidence of stenosis or irregularity. Both cervical internal carotid arteries are normal.  Both vertebral artery origins are widely patent. The right vertebral artery is larger than the left. Both vessels are patent through the cervical region.  Review of the MIP images confirms the above findings.  IMPRESSION: Negative study. No evidence of atherosclerotic narrowing or dissection. No vessel occlusion. No aneurysm or vascular malformation.   Electronically Signed   By: Paulina Fusi M.D.   On: 08/09/2013 19:01   Dg Chest 2 View  08/09/2013   CLINICAL DATA:  Stroke, hypertension, asthma.  EXAM: CHEST  2 VIEW  COMPARISON:  04/15/2012  FINDINGS: The heart size and mediastinal contours are within normal limits. Both lungs are clear. The visualized skeletal structures are unremarkable.  IMPRESSION: No active cardiopulmonary disease.   Electronically Signed   By: Charlett Nose M.D.   On: 08/09/2013 19:00   Ct Head Wo Contrast  08/09/2013   CLINICAL DATA:  New expressive aphasia,  hx of CVA in the past  EXAM: CT HEAD WITHOUT CONTRAST  TECHNIQUE: Contiguous axial images were obtained from the base of the skull through the vertex without intravenous contrast.  COMPARISON:  Head CT 04/20/2008  FINDINGS: Stable area of chronic left temporoparietal infarction. No acute intracranial abnormality. Specifically, no hemorrhage, hydrocephalus, mass lesion, acute infarction, or significant intracranial injury. No acute calvarial abnormality. Visualized paranasal sinuses and mastoid air cells are patent.  IMPRESSION: Stable  chronic left MCA distribution infarction. No acute intracranial abnormality.   Electronically Signed   By: Salome HolmesHector  Cooper M.D.   On: 08/09/2013 17:05   Ct Angio Neck W/cm &/or Wo/cm  08/09/2013   CLINICAL DATA:  exprssive aphasia  EXAM: CT ANGIOGRAPHY HEAD AND NECK  TECHNIQUE: Multidetector CT imaging of the head and neck was performed using the standard protocol during bolus administration of intravenous contrast. Multiplanar CT image reconstructions and MIPs were obtained to evaluate the vascular anatomy. Carotid stenosis measurements (when applicable) are obtained utilizing NASCET criteria, using the distal internal carotid diameter as the denominator.  CONTRAST:  100mL OMNIPAQUE IOHEXOL 350 MG/ML SOLN  COMPARISON:  Head CT same day.  MRI 05/17/2006.  FINDINGS: CTA HEAD FINDINGS  Both internal carotid arteries are widely patent through the siphon regions. No supra clinoid stenosis. The anterior and middle cerebral vessels appear normal without proximal stenosis, aneurysm or vascular malformation. Both vertebral arteries are patent to the basilar with the left being a very small vessel. No basilar stenosis. Posterior circulation branch vessels appear patent and normal.  Intracranial venous structures are patent.  Chronic ischemic changes are seen at the temporoparietal junction region on the left.  Review of the MIP images confirms the above findings.  CTA NECK FINDINGS  Branching  pattern of the brachiocephalic vessels from the arch is normal. No origin stenoses. Lung apices are clear.  Both common carotid arteries are widely patent to their respective bifurcation. Both carotid bifurcations appear normal without evidence of stenosis or irregularity. Both cervical internal carotid arteries are normal.  Both vertebral artery origins are widely patent. The right vertebral artery is larger than the left. Both vessels are patent through the cervical region.  Review of the MIP images confirms the above findings.  IMPRESSION: Negative study. No evidence of atherosclerotic narrowing or dissection. No vessel occlusion. No aneurysm or vascular malformation.   Electronically Signed   By: Paulina FusiMark  Shogry M.D.   On: 08/09/2013 19:01   Mr Brain Wo Contrast  08/10/2013   CLINICAL DATA:  Stroke  EXAM: MRI HEAD WITHOUT CONTRAST  MRA HEAD WITHOUT CONTRAST  TECHNIQUE: Multiplanar, multiecho pulse sequences of the brain and surrounding structures were obtained without intravenous contrast. Angiographic images of the head were obtained using MRA technique without contrast.  COMPARISON:  CT 08/09/2013  FINDINGS: MRI HEAD FINDINGS  Chronic infarct left parietal cortex. Chronic microvascular ischemic changes are present in the white matter bilaterally.  1 cm area of acute infarct in the left parietal operculum. Additional area of acute infarct in the left insula. Small area of acute infarct in the left lower parietal lobe inferior to the chronic cortical infarct.  Negative for hemorrhage or mass. Ventricle size is normal. No shift of the midline structures.  MRA HEAD FINDINGS  Right vertebral artery is widely patent and is normal. Hypoplastic left vertebral artery is small and irregular but does contribute to the basilar. Basilar widely patent. Superior cerebellar and posterior cerebral arteries are widely patent. Posterior communicating artery is patent bilaterally.  Anterior and middle cerebral arteries are widely  patent bilaterally without stenosis.  Negative for cerebral aneurysm.  IMPRESSION: Chronic left parietal infarct. Chronic microvascular ischemic change. Extension of acute infarct into the left parietal operculum, left insula, and left inferior parietal lobe, surrounding the area of chronic infarction.  Small irregular distal left vertebral artery. 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  Electronically Signed   By: Marlan Palau M.D.   On: 08/10/2013 09:14   Mr Maxine Glenn Head/brain Wo Cm  08/10/2013   CLINICAL DATA:  Stroke  EXAM: MRI HEAD WITHOUT CONTRAST  MRA HEAD WITHOUT CONTRAST  TECHNIQUE: Multiplanar, multiecho pulse sequences of the brain and surrounding structures were obtained without intravenous contrast. Angiographic images of the head were obtained using MRA technique without contrast.  COMPARISON:  CT 08/09/2013  FINDINGS: MRI HEAD FINDINGS  Chronic infarct left parietal cortex. Chronic microvascular ischemic changes are present in the white matter bilaterally.  1 cm area of acute infarct in the left parietal operculum. Additional area of acute infarct in the left insula. Small area of acute infarct in the left lower parietal lobe inferior to the chronic cortical infarct.  Negative for hemorrhage or mass. Ventricle size is normal. No shift of the midline structures.  MRA HEAD FINDINGS  Right vertebral artery is widely patent and is normal. Hypoplastic left vertebral artery is small and irregular but does contribute to the basilar. Basilar widely patent. Superior cerebellar and posterior cerebral arteries are widely patent. Posterior communicating artery is patent bilaterally.  Anterior and middle cerebral arteries are widely patent bilaterally without stenosis.  Negative for cerebral aneurysm.  IMPRESSION: Chronic left parietal infarct. Chronic microvascular ischemic change. Extension of acute infarct into the left parietal  operculum, left insula, and left inferior parietal lobe, surrounding the area of chronic infarction.  Small irregular distal left vertebral artery. 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   Electronically Signed   By: Marlan Palau M.D.   On: 08/10/2013 09:14     EKG Interpretation   Date/Time:  Sunday August 09 2013 16:08:56 EDT Ventricular Rate:  73 PR Interval:  139 QRS Duration: 80 QT Interval:  404 QTC Calculation: 445 R Axis:   66 Text Interpretation:  Sinus rhythm RAE, consider biatrial enlargement  Anteroseptal infarct, old No significant change since last tracing  Confirmed by Alizandra Loh  MD, Irfan Veal ([id number]) on 08/09/2013 4:11:11 PM      MDM   Final diagnoses:  Expressive aphasia    4:30 PM 63 y.o. female with history of stroke who presents with expressive aphasia. The daughter called her approximately one hour go and noticed that she could not understand her. Last time the patient was seen normal was on Friday when the daughter spoke with her on the phone and she was conversing normally. The daughter states she has a stutter from her previous stroke but has never had trouble expressing her thoughts. Patient is afebrile and vital signs are unremarkable here. She is alert and oriented x1. Will workup for stroke. In my discussion w/ the daughter it was decided that the onset of the sx was unknown as it could have happened any time over the weekend. The pt was unable to tell me when her sx started.   The patient does appear to have left upper arm pain. The daughter notes that she was recently seen and evaluated by a physician who believed that she had tendinitis. No evidence of infection or injury on my exam.  Consulted the hospitalist for admission.   Junius Argyle, MD 08/10/13 [phone]

## 2013-08-10 ENCOUNTER — Encounter (HOSPITAL_COMMUNITY): Payer: Self-pay | Admitting: *Deleted

## 2013-08-10 ENCOUNTER — Inpatient Hospital Stay (HOSPITAL_COMMUNITY): Payer: Medicare HMO

## 2013-08-10 DIAGNOSIS — I6350 Cerebral infarction due to unspecified occlusion or stenosis of unspecified cerebral artery: Secondary | ICD-10-CM

## 2013-08-10 DIAGNOSIS — R4701 Aphasia: Secondary | ICD-10-CM

## 2013-08-10 DIAGNOSIS — I6789 Other cerebrovascular disease: Secondary | ICD-10-CM

## 2013-08-10 LAB — BASIC METABOLIC PANEL
Anion gap: 14 (ref 5–15)
BUN: 11 mg/dL (ref 6–23)
CO2: 26 mEq/L (ref 19–32)
Calcium: 9.8 mg/dL (ref 8.4–10.5)
Chloride: 101 mEq/L (ref 96–112)
Creatinine, Ser: 0.72 mg/dL (ref 0.50–1.10)
GFR calc Af Amer: >90 mL/min (ref >90)
GFR calc non Af Amer: 89 mL/min — ABNORMAL LOW (ref >90)
Glucose, Bld: 142 mg/dL — ABNORMAL HIGH (ref 70–99)
Potassium: 3.5 mEq/L — ABNORMAL LOW (ref 3.7–5.3)
Sodium: 141 mEq/L (ref 137–147)

## 2013-08-10 LAB — CBC
HCT: 42.6 % (ref 36.0–46.0)
Hemoglobin: 14.8 g/dL (ref 12.0–15.0)
MCH: 30.1 pg (ref 26.0–34.0)
MCHC: 34.7 g/dL (ref 30.0–36.0)
MCV: 86.6 fL (ref 78.0–100.0)
Platelets: 334 10*3/uL (ref 150–400)
RBC: 4.92 MIL/uL (ref 3.87–5.11)
RDW: 12.7 % (ref 11.5–15.5)
WBC: 6.7 10*3/uL (ref 4.0–10.5)

## 2013-08-10 LAB — GLUCOSE, CAPILLARY
Glucose-Capillary: 106 mg/dL — ABNORMAL HIGH (ref 70–99)
Glucose-Capillary: 105 mg/dL — ABNORMAL HIGH (ref 70–99)
Glucose-Capillary: 93 mg/dL (ref 70–99)
Glucose-Capillary: 161 mg/dL — ABNORMAL HIGH (ref 70–99)

## 2013-08-10 LAB — LIPID PANEL
Cholesterol: 208 mg/dL — ABNORMAL HIGH (ref 0–200)
HDL: 53 mg/dL (ref >39)
LDL Cholesterol: 128 mg/dL — ABNORMAL HIGH (ref 0–99)
Total CHOL/HDL Ratio: 3.9 RATIO
Triglycerides: 135 mg/dL (ref <150)
VLDL: 27 mg/dL (ref 0–40)

## 2013-08-10 LAB — HEMOGLOBIN A1C
Hgb A1c MFr Bld: 5.8 % — ABNORMAL HIGH (ref <5.7)
Mean Plasma Glucose: 120 mg/dL — ABNORMAL HIGH (ref <117)

## 2013-08-10 LAB — MRSA PCR SCREENING: MRSA by PCR: NEGATIVE

## 2013-08-10 MED ORDER — SIMVASTATIN 40 MG PO TABS
40.0000 mg | ORAL_TABLET | Freq: Every day | ORAL | Status: DC
  Administered 2013-08-10 – 2013-08-13 (×4): 40 mg via ORAL
  Filled 2013-08-10 (×5): qty 1

## 2013-08-10 MED ORDER — LOSARTAN POTASSIUM 50 MG PO TABS
50.0000 mg | ORAL_TABLET | Freq: Every day | ORAL | Status: DC
  Administered 2013-08-10 – 2013-08-14 (×5): 50 mg via ORAL
  Filled 2013-08-10 (×5): qty 1

## 2013-08-10 MED ORDER — PREGABALIN 50 MG PO CAPS
50.0000 mg | ORAL_CAPSULE | Freq: Three times a day (TID) | ORAL | Status: DC
  Administered 2013-08-10 – 2013-08-14 (×10): 50 mg via ORAL
  Filled 2013-08-10 (×11): qty 1

## 2013-08-10 NOTE — Consult Note (Signed)
Referring Physician: Dr. Coralyn Pear    Chief Complaint: dysarthria  HPI:                                                                                                                                         Kara Mendoza is an 63 y.o. female, right handed, with a past medical history relevant for left MCA distribution infarct back in 2008 with residual language impairment, hypertension, dyslipidemia, tobacco abuse, transferred to [facility] for further evaluation and management of dysarthria. She initially presented to [facility] because of difficulty expressing herself. She indicated that she had had speech trouble since her stroke in 2008, but yesterday around noon time she got very concerned because all of the sudden she couldn't get words out even knowing what she wanted to say, and also had left upper extremity weakness and that problem has been present evert since.  Denies associated HA, vertigo, double vision, difficulty swallowing, confusion, or vision impairment. No chest pain or palpitations. She lives alone and atient's daughter said that she called her yesterday around 2 pm and couldn't understand what she was trying to say.  CT/CTA brain showed no acute abnormality. Takes plavix daily. Patient c/o pain in the left hand states she has been recently diagnosed with carpel tunnel syndrome and has been taking percocet at home. Date last known well: 08/08/13 Time last known well: unlear tPA Given: no, out of the window   Past Medical History  Diagnosis Date  . Stroke   . Neuromuscular disorder   . Hypertension   . Hyperlipidemia   . Asthma   . Diabetes mellitus   . Hyperthyroidism   . Esophageal reflux   . Constipation   . Major depressive disorder, recurrent episode, moderate   . Anxiety state, unspecified   . History of fall   . Abnormality of gait   . Chronic pain syndrome   . Tobacco use disorder   . Osteoarthritis   . Dysarthria   . Acute bronchitis   . Chronic arm pain 2008     left  . Chronic leg pain 2008    left  . Fatigue   . Chronic headache     Past Surgical History  Procedure Laterality Date  . Cesarean section      x3  . Finger surgery      Family History  Problem Relation Age of Onset  . Diabetes Sister   . Cancer Father   . Diabetes Brother    Social History:  reports that she has been smoking Cigarettes.  She has a 3 pack-year smoking history. She has never used smokeless tobacco. She reports that she does not drink alcohol or use illicit drugs.  Allergies:  Allergies  Allergen Reactions  . Aspirin Other (See Comments)    Possibly itching and "feeling little weird"  . Penicillins Hives    Medications:  I have reviewed the patient's current medications.  ROS:                                                                                                                                       History obtained from the patient and chart review.  General ROS: negative for - chills, fatigue, fever, night sweats, weight gain or weight loss Psychological ROS: negative for - behavioral disorder, hallucinations, memory difficulties, mood swings or suicidal ideation Ophthalmic ROS: negative for - blurry vision, double vision, eye pain or loss of vision ENT ROS: negative for - epistaxis, nasal discharge, oral lesions, sore throat, tinnitus or vertigo Allergy and Immunology ROS: negative for - hives or itchy/watery eyes Hematological and Lymphatic ROS: negative for - bleeding problems, bruising or swollen lymph nodes Endocrine ROS: negative for - galactorrhea, hair pattern changes, polydipsia/polyuria or temperature intolerance Respiratory ROS: negative for - cough, hemoptysis, shortness of breath or wheezing Cardiovascular ROS: negative for - chest pain, dyspnea on exertion, edema or irregular heartbeat Gastrointestinal ROS:  negative for - abdominal pain, diarrhea, hematemesis, nausea/vomiting or stool incontinence Genito-Urinary ROS: negative for - dysuria, hematuria, incontinence or urinary frequency/urgency Musculoskeletal ROS: negative for - joint swelling Neurological ROS: as noted in HPI Dermatological ROS: negative for rash and skin lesion changes  Physical exam: pleasant female in no apparent distress. Blood pressure 178/80, pulse 67, temperature 98.3 F (36.8 C), temperature source Oral, resp. rate 18, height 5' 2"  (1.575 m), weight 78.7 kg (173 lb 8 oz), SpO2 97.00%. Head: normocephalic. Neck: supple, no bruits, no JVD. Cardiac: no murmurs. Lungs: clear. Abdomen: soft, no tender, no mass. Extremities: mild LE edema. Neurologic Examination:                                                                                                      Mental Status: Alert, oriented, thought content appropriate. Stuttering without frank dysarthria or dysphasia.  Able to follow 3 step commands without difficulty. Cranial Nerves: II: Discs flat bilaterally; Visual fields grossly normal, pupils equal, round, reactive to light and accommodation III,IV, VI: ptosis not present, extra-ocular motions intact bilaterally V,VII: smile symmetric, facial light touch sensation normal bilaterally VIII: hearing normal bilaterally IX,X: gag reflex present XI: bilateral shoulder shrug XII: midline tongue extension without atrophy or fasciculations Motor: Right : Upper extremity   5/5    Left:     Upper extremity   5/5  Lower extremity   5/5     Lower extremity  5/5 Tone and bulk:normal tone throughout; no atrophy noted Sensory: Pinprick and light touch intact throughout, bilaterally Deep Tendon Reflexes:  Right: Upper Extremity   Left: Upper extremity   biceps (C-5 to C-6) 2/4   biceps (C-5 to C-6) 2/4 tricep (C7) 2/4    triceps (C7) 2/4 Brachioradialis (C6) 2/4  Brachioradialis (C6) 2/4  Lower Extremity Lower Extremity   quadriceps (L-2 to L-4) 2/4   quadriceps (L-2 to L-4) 2/4 Achilles (S1) 2/4   Achilles (S1) 2/4  Plantars: Right: downgoing   Left: downgoing Cerebellar: normal finger-to-nose,  normal heel-to-shin test Gait: No tested.      Results for orders placed during the hospital encounter of 08/09/13 (from the past 48 hour(s))  CBC     Status: None   Collection Time    08/09/13  4:30 PM      Result Value Ref Range   WBC 9.6  4.0 - 10.5 K/uL   RBC 5.01  3.87 - 5.11 MIL/uL   Hemoglobin 14.9  12.0 - 15.0 g/dL   HCT 43.3  36.0 - 46.0 %   MCV 86.4  78.0 - 100.0 fL   MCH 29.7  26.0 - 34.0 pg   MCHC 34.4  30.0 - 36.0 g/dL   RDW 12.5  11.5 - 15.5 %   Platelets 343  150 - 400 K/uL  COMPREHENSIVE METABOLIC PANEL     Status: Abnormal   Collection Time    08/09/13  4:30 PM      Result Value Ref Range   Sodium 140  137 - 147 mEq/L   Potassium 4.1  3.7 - 5.3 mEq/L   Chloride 101  96 - 112 mEq/L   CO2 26  19 - 32 mEq/L   Glucose, Bld 109 (*) 70 - 99 mg/dL   BUN 14  6 - 23 mg/dL   Creatinine, Ser 0.80  0.50 - 1.10 mg/dL   Calcium 10.2  8.4 - 10.5 mg/dL   Total Protein 7.5  6.0 - 8.3 g/dL   Albumin 4.1  3.5 - 5.2 g/dL   AST 14  0 - 37 U/L   ALT 16  0 - 35 U/L   Alkaline Phosphatase 69  39 - 117 U/L   Total Bilirubin 0.6  0.3 - 1.2 mg/dL   GFR calc non Af Amer 76 (*) >90 mL/min   GFR calc Af Amer 88 (*) >90 mL/min   Comment: (NOTE)     The eGFR has been calculated using the CKD EPI equation.     This calculation has not been validated in all clinical situations.     eGFR's persistently <90 mL/min signify possible Chronic Kidney     Disease.   Anion gap 13  5 - 15  HEMOGLOBIN A1C     Status: Abnormal   Collection Time    08/09/13  4:30 PM      Result Value Ref Range   Hemoglobin A1C 5.8 (*) <5.7 %   Comment: (NOTE)                                                                               According to the ADA Clinical Practice Recommendations for  2011, when     HbA1c is used as  a screening test:      >=6.5%   Diagnostic of Diabetes Mellitus               (if abnormal result is confirmed)     5.7-6.4%   Increased risk of developing Diabetes Mellitus     References:Diagnosis and Classification of Diabetes Mellitus,Diabetes     EZMO,2947,65(YYTKP 1):S62-S69 and Standards of Medical Care in             Diabetes - 2011,Diabetes TWSF,6812,75 (Suppl 1):S11-S61.   Mean Plasma Glucose 120 (*) <117 mg/dL   Comment: Performed at [facility]     Status: None   Collection Time    08/09/13  4:30 PM      Result Value Ref Range   Prothrombin Time 13.0  11.6 - 15.2 seconds   INR 0.98  0.00 - 1.49  TROPONIN I     Status: None   Collection Time    08/09/13  4:30 PM      Result Value Ref Range   Troponin I <0.30  <0.30 ng/mL   Comment:            Due to the release kinetics of cTnI,     a negative result within the first hours     of the onset of symptoms does not rule out     myocardial infarction with certainty.     If myocardial infarction is still suspected,     repeat the test at appropriate intervals.  URINE RAPID DRUG SCREEN (HOSP PERFORMED)     Status: None   Collection Time    08/09/13  5:45 PM      Result Value Ref Range   Opiates NONE DETECTED  NONE DETECTED   Cocaine NONE DETECTED  NONE DETECTED   Benzodiazepines NONE DETECTED  NONE DETECTED   Amphetamines NONE DETECTED  NONE DETECTED   Tetrahydrocannabinol NONE DETECTED  NONE DETECTED   Barbiturates NONE DETECTED  NONE DETECTED   Comment:            DRUG SCREEN FOR MEDICAL PURPOSES     ONLY.  IF CONFIRMATION IS NEEDED     FOR ANY PURPOSE, NOTIFY LAB     WITHIN 5 DAYS.                LOWEST DETECTABLE LIMITS     FOR URINE DRUG SCREEN     Drug Class       Cutoff (ng/mL)     Amphetamine      1000     Barbiturate      200     Benzodiazepine   170     Tricyclics       017     Opiates          300     Cocaine          300     THC              50  URINALYSIS, ROUTINE W REFLEX  MICROSCOPIC     Status: Abnormal   Collection Time    08/09/13  5:45 PM      Result Value Ref Range   Color, Urine YELLOW  YELLOW   APPearance CLEAR  CLEAR   Specific Gravity, Urine 1.010  1.005 - 1.030   pH 6.0  5.0 - 8.0   Glucose, UA NEGATIVE  NEGATIVE mg/dL  Hgb urine dipstick NEGATIVE  NEGATIVE   Bilirubin Urine NEGATIVE  NEGATIVE   Ketones, ur NEGATIVE  NEGATIVE mg/dL   Protein, ur NEGATIVE  NEGATIVE mg/dL   Urobilinogen, UA 0.2  0.0 - 1.0 mg/dL   Nitrite NEGATIVE  NEGATIVE   Leukocytes, UA LARGE (*) NEGATIVE  URINE MICROSCOPIC-ADD ON     Status: None   Collection Time    08/09/13  5:45 PM      Result Value Ref Range   Squamous Epithelial / LPF RARE  RARE   WBC, UA 3-6  <3 WBC/hpf   RBC / HPF 0-2  <3 RBC/hpf   Urine-Other TRICHOMONAS PRESENT    TSH     Status: None   Collection Time    08/09/13 10:30 PM      Result Value Ref Range   TSH 3.400  0.350 - 4.500 uIU/mL  GLUCOSE, CAPILLARY     Status: None   Collection Time    08/09/13 10:49 PM      Result Value Ref Range   Glucose-Capillary 96  70 - 99 mg/dL   Ct Angio Head W/cm &/or Wo Cm  08/09/2013   CLINICAL DATA:  exprssive aphasia  EXAM: CT ANGIOGRAPHY HEAD AND NECK  TECHNIQUE: Multidetector CT imaging of the head and neck was performed using the standard protocol during bolus administration of intravenous contrast. Multiplanar CT image reconstructions and MIPs were obtained to evaluate the vascular anatomy. Carotid stenosis measurements (when applicable) are obtained utilizing NASCET criteria, using the distal internal carotid diameter as the denominator.  CONTRAST:  119m OMNIPAQUE IOHEXOL 350 MG/ML SOLN  COMPARISON:  Head CT same day.  MRI 05/17/2006.  FINDINGS: CTA HEAD FINDINGS  Both internal carotid arteries are widely patent through the siphon regions. No supra clinoid stenosis. The anterior and middle cerebral vessels appear normal without proximal stenosis, aneurysm or vascular malformation. Both vertebral arteries  are patent to the basilar with the left being a very small vessel. No basilar stenosis. Posterior circulation branch vessels appear patent and normal.  Intracranial venous structures are patent.  Chronic ischemic changes are seen at the temporoparietal junction region on the left.  Review of the MIP images confirms the above findings.  CTA NECK FINDINGS  Branching pattern of the brachiocephalic vessels from the arch is normal. No origin stenoses. Lung apices are clear.  Both common carotid arteries are widely patent to their respective bifurcation. Both carotid bifurcations appear normal without evidence of stenosis or irregularity. Both cervical internal carotid arteries are normal.  Both vertebral artery origins are widely patent. The right vertebral artery is larger than the left. Both vessels are patent through the cervical region.  Review of the MIP images confirms the above findings.  IMPRESSION: Negative study. No evidence of atherosclerotic narrowing or dissection. No vessel occlusion. No aneurysm or vascular malformation.   Electronically Signed   By: MNelson ChimesM.D.   On: 08/09/2013 19:01   Dg Chest 2 View  08/09/2013   CLINICAL DATA:  Stroke, hypertension, asthma.  EXAM: CHEST  2 VIEW  COMPARISON:  04/15/2012  FINDINGS: The heart size and mediastinal contours are within normal limits. Both lungs are clear. The visualized skeletal structures are unremarkable.  IMPRESSION: No active cardiopulmonary disease.   Electronically Signed   By: KRolm BaptiseM.D.   On: 08/09/2013 19:00   Ct Head Wo Contrast  08/09/2013   CLINICAL DATA:  New expressive aphasia, hx of CVA in the past  EXAM: CT HEAD WITHOUT  CONTRAST  TECHNIQUE: Contiguous axial images were obtained from the base of the skull through the vertex without intravenous contrast.  COMPARISON:  Head CT 04/20/2008  FINDINGS: Stable area of chronic left temporoparietal infarction. No acute intracranial abnormality. Specifically, no hemorrhage,  hydrocephalus, mass lesion, acute infarction, or significant intracranial injury. No acute calvarial abnormality. Visualized paranasal sinuses and mastoid air cells are patent.  IMPRESSION: Stable chronic left MCA distribution infarction. No acute intracranial abnormality.   Electronically Signed   By: Margaree Mackintosh M.D.   On: 08/09/2013 17:05   Ct Angio Neck W/cm &/or Wo/cm  08/09/2013   CLINICAL DATA:  exprssive aphasia  EXAM: CT ANGIOGRAPHY HEAD AND NECK  TECHNIQUE: Multidetector CT imaging of the head and neck was performed using the standard protocol during bolus administration of intravenous contrast. Multiplanar CT image reconstructions and MIPs were obtained to evaluate the vascular anatomy. Carotid stenosis measurements (when applicable) are obtained utilizing NASCET criteria, using the distal internal carotid diameter as the denominator.  CONTRAST:  158m OMNIPAQUE IOHEXOL 350 MG/ML SOLN  COMPARISON:  Head CT same day.  MRI 05/17/2006.  FINDINGS: CTA HEAD FINDINGS  Both internal carotid arteries are widely patent through the siphon regions. No supra clinoid stenosis. The anterior and middle cerebral vessels appear normal without proximal stenosis, aneurysm or vascular malformation. Both vertebral arteries are patent to the basilar with the left being a very small vessel. No basilar stenosis. Posterior circulation branch vessels appear patent and normal.  Intracranial venous structures are patent.  Chronic ischemic changes are seen at the temporoparietal junction region on the left.  Review of the MIP images confirms the above findings.  CTA NECK FINDINGS  Branching pattern of the brachiocephalic vessels from the arch is normal. No origin stenoses. Lung apices are clear.  Both common carotid arteries are widely patent to their respective bifurcation. Both carotid bifurcations appear normal without evidence of stenosis or irregularity. Both cervical internal carotid arteries are normal.  Both vertebral  artery origins are widely patent. The right vertebral artery is larger than the left. Both vessels are patent through the cervical region.  Review of the MIP images confirms the above findings.  IMPRESSION: Negative study. No evidence of atherosclerotic narrowing or dissection. No vessel occlusion. No aneurysm or vascular malformation.   Electronically Signed   By: MNelson ChimesM.D.   On: 08/09/2013 19:01    Assessment: 63y.o. female with a past medical history relevant for left MCA distribution infarct back in 2008 with residual language impairment, hypertension, dyslipidemia, tobacco abuse[facility] and leftft upper extremity weakness. Out of the window for thrombolysis. She has stuttering but no frank dysarthria or dysphasia. There is pain in the left UE but I can not appreciate obvious muscle weakness with a central pattern. In any case, she has significant risk factors for stroke and agree with pursuing further stroke work up. Continue plavix pending stroke work up results. Will follow up.   ODorian Pod MD [facility] [phone] 08/10/2013, 12:12 AM

## 2013-08-10 NOTE — Progress Notes (Signed)
Patient c/o pain in the left hand states she has been recently diagnosed with carpel tunnel syndrome and has been taking percocet at home. Also requesting for ambien for sleep  T. Claiborne Billingsallahan NP made aware and orders for the same  Written.

## 2013-08-10 NOTE — Progress Notes (Signed)
  Echocardiogram 2D Echocardiogram has been performed.  Kara Mendoza FRANCES 08/10/2013, 2:11 PM

## 2013-08-10 NOTE — Progress Notes (Signed)
Test for MRSA by PCR performed today is  negative.

## 2013-08-10 NOTE — Evaluation (Signed)
Physical Therapy Evaluation Patient Details Name: Kara HammockSheila L Zartman MRN: [id number] DOB: 15-Feb-1950 Today's Date: 08/10/2013   History of Present Illness  Admitted with slurred speech. PMHx left temporal parietal infarction back in 2008 at which time she presented with expressive aphasia, has history of hypertension, dyslipidemia, tobacco abuse as well. Pt with very painfull LUE (if she even thinks you are going to touch it she winces), says [facility] [place] that she has carpal tunnel and a blood clot (stroke PA in room and is going tofollow up on this). MRI: Chronic left parietal infarct. Chronic microvascular ischemic change. Extension of acute infarct into the left parietal operculum, left insula, and left inferior parietal lobe, surrounding the area of chronic infarction.  Clinical Impression  Pt admitted with/for slurred speech and found to have an extension of her previous stroke.  Pt currently limited functionally due to the problems listed. ( See problems list.)   Pt will benefit from PT to maximize function and safety in order to get ready for next venue listed below.     Follow Up Recommendations CIR    Equipment Recommendations  None recommended by PT    Recommendations for Other Services Rehab consult     Precautions / Restrictions Precautions Precautions: Fall Precaution Comments: LUE pain      Mobility  Bed Mobility Overal bed mobility: Needs Assistance Bed Mobility: Supine to Sit     Supine to sit: Min guard (with heavy use of the rail)     General bed mobility comments: heavy use of the rail  Transfers Overall transfer level: Needs assistance Equipment used: 1 person hand held assist;Quad cane Transfers: Sit to/from Stand Sit to Stand: Min assist         General transfer comment: slow and guarded  Ambulation/Gait Ambulation/Gait assistance: Min assist Ambulation Distance (Feet): 230 Feet Assistive device: Quad cane Gait  Pattern/deviations: Step-through pattern Gait velocity: slower   General Gait Details: mildly unsteady with quad cane and mildly paretic on the left  Stairs            Wheelchair Mobility    Modified Rankin (Stroke Patients Only) Modified Rankin (Stroke Patients Only) Pre-Morbid Rankin Score: Moderate disability Modified Rankin: Moderately severe disability     Balance Overall balance assessment: Needs assistance Sitting-balance support: No upper extremity supported;Feet supported Sitting balance-Leahy Scale: Fair     Standing balance support: Single extremity supported Standing balance-Leahy Scale: Poor                               Pertinent Vitals/Pain 10/10 pain L UE    Home Living Family/patient expects to be discharged to:: Private residence Living Arrangements: Alone Available Help at Discharge: Family;Personal care attendant (son lives about 25 minutes away) Type of Home: House Home Access: Stairs to enter Entrance Stairs-Rails: Right Entrance Stairs-Number of Steps: 6 Home Layout: One level Home Equipment: Environmental consultantWalker - 2 wheels;Cane - single point      Prior Function Level of Independence: Needs assistance   Gait / Transfers Assistance Needed: uses cane, but has not previously needed physical support  ADL's / Homemaking Assistance Needed: Nurse aide assistance 2 hours a day to help with B/D since about 9 months ago when she started having pain in her LUE (which has continued to get worse), up until then the aid mainly helped her around the house for IADLs  Hand Dominance        Extremity/Trunk Assessment   Upper Extremity Assessment: Defer to OT evaluation       LUE Deficits / Details: Can move all joints however slow and laborous due to pain and not full range due to edema and pain--arm is essentially non-functional   Lower Extremity Assessment: Overall WFL for tasks assessed;LLE deficits/detail   LLE Deficits / Details:  grossly >4/5     Communication   Communication: Expressive difficulties  Cognition Arousal/Alertness: Awake/alert Behavior During Therapy: WFL for tasks assessed/performed Overall Cognitive Status: Within Functional Limits for tasks assessed                      General Comments      Exercises        Assessment/Plan    PT Assessment Patient needs continued PT services  PT Diagnosis Difficulty walking;Abnormality of gait;Generalized weakness   PT Problem List Decreased strength;Decreased activity tolerance;Decreased balance;Decreased mobility;Decreased coordination  PT Treatment Interventions DME instruction;Gait training;Stair training;Functional mobility training;Therapeutic activities;Balance training;Patient/family education   PT Goals (Current goals can be found in the Care Plan section) Acute Rehab PT Goals Patient Stated Goal: be able to live by myself PT Goal Formulation: With patient Time For Goal Achievement: 08/24/13 Potential to Achieve Goals: Good    Frequency Min 3X/week   Barriers to discharge Decreased caregiver support      Co-evaluation               End of Session   Activity Tolerance: Patient tolerated treatment well Patient left: in bed;Other (comment);with call bell/phone within reach (sitting EOB) Nurse Communication: Mobility status         Time: [id number] PT Time Calculation (min): 26 min   Charges:   PT Evaluation $Initial PT Evaluation Tier I: 1 Procedure PT Treatments $Gait Training: 8-22 mins   PT G Codes:          Eilam [place], Eliseo GumKenneth V 08/10/2013, 4:22 PM 08/10/2013  [place] BingKen Story Vanvranken, PT [phone]  (pager)

## 2013-08-10 NOTE — Progress Notes (Signed)
Occupational Therapy Treatment Patient Details Name: Kara HammockSheila L Moring MRN: [id number] DOB: 11/26/1950 Today's Date: 08/10/2013    History of present illness Admitted with slurred speech. PMHx left temporal parietal infarction back in 2008 at which time she presented with expressive aphasia, has history of hypertension, dyslipidemia, tobacco abuse as well. Pt with very painfull LUE (if she even thinks you are going to touch it she winces), says she was [facility] [place] that she has carpal tunnel and a blood clot (stroke PA in room and is going tofollow up on this). MRI: Chronic left parietal infarct. Chronic microvascular ischemic change. Extension of acute infarct into the left parietal operculum, left insula, and left inferior parietal lobe, surrounding the area of chronic infarction.   OT comments  Pt with pain 9/10 Lt. UE chronic in nature.  Pt performed UE exercises.  Inconsistent responses to Lt. UE movement  Follow Up Recommendations  CIR    Equipment Recommendations  3 in 1 bedside comode    Recommendations for Other Services      Precautions / Restrictions Precautions Precautions: Fall Precaution Comments: LUE pain       Mobility Bed Mobility Overal bed mobility: Needs Assistance Bed Mobility: Supine to Sit     Supine to sit: Min guard (with heavy use of the rail)     General bed mobility comments: heavy use of the rail  Transfers Overall transfer level: Needs assistance Equipment used: 1 person hand held assist;Quad cane Transfers: Sit to/from Stand Sit to Stand: Min assist         General transfer comment: slow and guarded    Balance Overall balance assessment: Needs assistance Sitting-balance support: No upper extremity supported;Feet supported Sitting balance-Leahy Scale: Fair     Standing balance support: Single extremity supported Standing balance-Leahy Scale: Poor                     ADL                                         General ADL Comments: Pt with minimal edema Lt. hand when compared to Rt.  Pt with complaint of pain Lt hand to shoulder.  Unable to describe, but when asked, she endorses sharp, burning sensation.  Pt is guarded with movment, but demonstrates isolated movement throughout Lt. UE.  Pt performe shoulder flexion and abduction to ~90-100 x 8 reps with encouragement.  Requires increased time.  When asked to make a fist, pt flexes at the PIPs.  difficult to get her to flex MCPs, however, when she engages in functional tasks full fist noted.  Pt winces and cries with the slightest touch to Lt. UE however, when she was showing therapist her palms, she reached with Lt. UE to rub Rt palm with no guarding or indication of pain - fluid movement noted.  She also did the same when adjusting wrist bracelet on Lt. UE.        Vision                     Perception     Praxis      Cognition   Behavior During Therapy: [facility] for tasks assessed/performed Overall Cognitive Status: Within Functional Limits for tasks assessed  Extremity/Trunk Assessment  Upper Extremity Assessment Upper Extremity Assessment: Defer to OT evaluation   Lower Extremity Assessment Lower Extremity Assessment: Overall WFL for tasks assessed;LLE deficits/detail LLE Deficits / Details: grossly >4/5 LLE Coordination: decreased fine motor        Exercises Other Exercises Other Exercises: Lt shoulder flexion x 10 Other Exercises: Lt shoulder abduction Other Exercises: Lt. elbow flexion x 8 Other Exercises: finger flexion/ext x 8   Shoulder Instructions       General Comments      Pertinent Vitals/ Pain         Home Living Family/patient expects to be discharged to:: Private residence Living Arrangements: Alone Available Help at Discharge: Family;Personal care attendant (son lives about 25 minutes away) Type of Home: House Home Access: Stairs to enter Water quality scientistntrance  Stairs-Number of Steps: 6 Entrance Stairs-Rails: Right Home Layout: One level               Home Equipment: Environmental consultantWalker - 2 wheels;Cane - single point          Prior Functioning/Environment Level of Independence: Needs assistance  Gait / Transfers Assistance Needed: uses cane, but has not previously needed physical support ADL's / Homemaking Assistance Needed: Nurse aide assistance 2 hours a day to help with B/D since about 9 months ago when she started having pain in her LUE (which has continued to get worse), up until then the aid mainly helped her around the house for IADLs       Frequency Min 3X/week     Progress Toward Goals  OT Goals(current goals can now be found in the care plan section)  Progress towards OT goals: Progressing toward goals  Acute Rehab OT Goals Patient Stated Goal: be able to live by myself OT Goal Formulation: With patient Time For Goal Achievement: 08/17/13 Potential to Achieve Goals: Good ADL Goals Pt Will Perform Grooming: with min guard assist;standing Pt Will Transfer to Toilet: with min guard assist;ambulating;grab bars Additional ADL Goal #1: Pt will be S for exercises (loading) for LUE  Plan Discharge plan remains appropriate    Co-evaluation                 End of Session     Activity Tolerance Patient limited by pain   Patient Left in bed;with family/visitor present   Nurse Communication          Time: 9562-13081440-1504 OT Time Calculation (min): 24 min  Charges: OT General Charges $OT Visit: 1 Procedure OT Treatments $Therapeutic Activity: 23-37 mins  Danaiya Steadman M 08/10/2013, 5:28 PM

## 2013-08-10 NOTE — Progress Notes (Signed)
Patient picked up for MRI.

## 2013-08-10 NOTE — Evaluation (Signed)
Occupational Therapy Evaluation Patient Details Name: Kara Mendoza MRN: [id number] DOB: February 26, 1950 Today's Date: 08/10/2013    History of Present Illness Admitted with slurred speech. PMHx left temporal parietal infarction back in 2008 at which time she presented with expressive aphasia, has history of hypertension, dyslipidemia, tobacco abuse as well. Pt with very painfull LUE (if she even thinks you are going to touch it she winces), says she was told by [facility] at [facility] [place] that she has carpal tunnel and a blood clot (stroke PA in room and is going tofollow up on this). MRI: Chronic left parietal infarct. Chronic microvascular ischemic change. Extension of acute infarct into the left parietal operculum, left insula, and left inferior parietal lobe, surrounding the area of chronic infarction.   Clinical Impression   This 63 yo female admitted and found to have new extension of remote CVA as well as worsening of pre-morbid LUE issues that are all affecting pts use of LUE, decreased balance, decreased mobility, and increased pain making pt unable to care for herself to the extent she could pta. Pt will benefit from acute OT with follow up on CIR.    Follow Up Recommendations  CIR    Equipment Recommendations  3 in 1 bedside comode       Precautions / Restrictions Precautions Precautions: Fall Precaution Comments: LUE pain Restrictions Weight Bearing Restrictions: No      Mobility Bed Mobility Overal bed mobility: Needs Assistance Bed Mobility: Supine to Sit     Supine to sit: Min assist;HOB elevated        Transfers Overall transfer level: Needs assistance Equipment used: 1 person hand held assist Transfers: Sit to/from Stand Sit to Stand: Min assist         General transfer comment: Very tentative with sit<>stand and ambulation due to decreased balance and decreased use of LUE    Balance Overall balance assessment: Needs assistance Sitting-balance support: Feet  unsupported;No upper extremity supported Sitting balance-Leahy Scale: Fair     Standing balance support: Single extremity supported Standing balance-Leahy Scale: Poor                              ADL Overall ADL's : Needs assistance/impaired Eating/Feeding: Set up;Sitting   Grooming: Minimal assistance;Sitting   Upper Body Bathing: Minimal assitance;Sitting   Lower Body Bathing: Moderate assistance;Sit to/from stand   Upper Body Dressing : Moderate assistance;Sitting   Lower Body Dressing: Maximal assistance;Sit to/from stand   Toilet Transfer: Minimal assistance;Ambulation (one person HHA (due to could not use RW due to decreased use of LUE); bed>around to other side>back around and sit where we startd)   Toileting- Clothing Manipulation and Hygiene: Total assistance;Sit to/from stand                         Pertinent Vitals/Pain 10/10 LUE with movement; made PA in room aware and RN aware that PA had talked about new or changing pain meds.     Hand Dominance  right    Extremity/Trunk Assessment Upper Extremity Assessment Upper Extremity Assessment: LUE deficits/detail LUE Deficits / Details: Can move all joints however slow and laborous due to pain and not full range due to edema and pain--arm is essentially non-functional LUE Sensation:  (hypersensitive to touch) LUE Coordination: decreased fine motor;decreased gross motor   Lower Extremity Assessment Lower Extremity Assessment: Defer to PT evaluation  Communication Communication Communication: Expressive difficulties   Cognition Arousal/Alertness: Awake/alert Behavior During Therapy: WFL for tasks assessed/performed Overall Cognitive Status: Within Functional Limits for tasks assessed                                Home Living Family/patient expects to be discharged to:: Private residence Living Arrangements: Alone Available Help at Discharge: Family Type of Home:  House Home Access: Stairs to enter Secretary/administratorntrance Stairs-Number of Steps: 6 Entrance Stairs-Rails: Right Home Layout: One level     Bathroom Shower/Tub: Tub/shower unit Shower/tub characteristics: Engineer, building servicesCurtain Bathroom Toilet: Standard                Prior Functioning/Environment Level of Independence: Needs assistance    ADL's / Homemaking Assistance Needed: Nurse aide assistance 2 hours a day        OT Diagnosis: Generalized weakness;Acute pain   OT Problem List: Decreased strength;Decreased range of motion;Decreased activity tolerance;Impaired balance (sitting and/or standing);Pain;Impaired sensation;Impaired UE functional use;Decreased knowledge of use of DME or AE;Decreased coordination;Increased edema   OT Treatment/Interventions: Self-care/ADL training;Patient/family education;Balance training;Therapeutic activities;Therapeutic exercise;DME and/or AE instruction    OT Goals(Current goals can be found in the care plan section) Acute Rehab OT Goals Patient Stated Goal: pain control of LUE OT Goal Formulation: With patient Time For Goal Achievement: 08/17/13 Potential to Achieve Goals: Good  OT Frequency: Min 3X/week              End of Session Equipment Utilized During Treatment: Gait belt Nurse Communication:  (PA is changing pain meds)  Activity Tolerance: Patient limited by pain Patient left: with family/visitor present (sitting on EOB with stroke PA assessing her)   Time: 1027-25361054-1120 OT Time Calculation (min): 26 min Charges:  OT General Charges $OT Visit: 1 Procedure OT Evaluation $Initial OT Evaluation Tier I: 1 Procedure OT Treatments $Self Care/Home Management : 8-22 mins  Evette GeorgesLeonard, Secily Walthour Kara Mendoza [phone] 08/10/2013, 1:19 PM

## 2013-08-10 NOTE — Progress Notes (Signed)
[facility] PROGRESS NOTE  Kara HammockSheila L Mendoza RUE:[id number] DOB: 1950/06/11 DOA: 08/09/2013 PCP: Dorrene GermanAVBUERE,EDWIN A, MD  Assessment/Plan: 1. Acute L parietal infarct in background of chronic L parietal infarct -continue plavix/statin - Neuro following - get ECHO/carotid duplex - Pt/OT/St consult  2. Hypertension.  - resume Losartan at low dose, hold HCTZ  3. Dyslipidemia.  -FU lipid panel, continue statin   4. Type 2 diabetes mellitus -hold metformin, SSI  5. Hyperthyroidism.  -Will continue Methimazole, FU TSH   DVT prophylaxis. Lovenox  Code Status: Full Code Family Communication: none at bedside Disposition Plan: home pending workup   Consultants:  Neuro  HPI/Subjective: Still with dysarthria, stressed about her Rent/bills etc  Objective: Filed Vitals:   08/10/13 0924  BP: 141/72  Pulse: 74  Temp: 98.4 F (36.9 C)  Resp: 18    Intake/Output Summary (Last 24 hours) at 08/10/13 1213 Last data filed at 08/10/13 0900  Gross per 24 hour  Intake    240 ml  Output    250 ml  Net    -10 ml   Filed Weights   08/09/13 2030  Weight: 78.7 kg (173 lb 8 oz)    Exam:   General:  AAOx3, no distress  Cardiovascular: S1S2/RRR  Respiratory: CTAB  Abdomen: soft, NT, BS present  Musculoskeletal: no edema c/c  Neuro: dysarthric, motor 5/5, sensory light touch intact, DTR 2plus, plantars mute   Data Reviewed: Basic Metabolic Panel:  Recent Labs Lab 08/09/13 1630 08/10/13 0848  NA 140 141  K 4.1 3.5*  CL 101 101  CO2 26 26  GLUCOSE 109* 142*  BUN 14 11  CREATININE 0.80 0.72  CALCIUM 10.2 9.8   Liver Function Tests:  Recent Labs Lab 08/09/13 1630  AST 14  ALT 16  ALKPHOS 69  BILITOT 0.6  PROT 7.5  ALBUMIN 4.1   No results found for this basename: LIPASE, AMYLASE,  in the last 168 hours No results found for this basename: AMMONIA,  in the last 168 hours CBC:  Recent Labs Lab 08/09/13 1630 08/10/13 0848  WBC 9.6 6.7  HGB 14.9 14.8   HCT 43.3 42.6  MCV 86.4 86.6  PLT 343 334   Cardiac Enzymes:  Recent Labs Lab 08/09/13 1630  TROPONINI <0.30   BNP (last 3 results) No results found for this basename: PROBNP,  in the last 8760 hours CBG:  Recent Labs Lab 08/09/13 2249 08/10/13 0811 08/10/13 1157  GLUCAP 96 106* 105*    Recent Results (from the past 240 hour(s))  MRSA PCR SCREENING     Status: None   Collection Time    08/10/13  1:36 AM      Result Value Ref Range Status   MRSA by PCR NEGATIVE  NEGATIVE Final   Comment:            The GeneXpert MRSA Assay (FDA     approved for NASAL specimens     only), is one component of a     comprehensive MRSA colonization     surveillance program. It is not     intended to diagnose MRSA     infection nor to guide or     monitor treatment for     MRSA infections.     Studies: Ct Angio Head W/cm &/or Wo Cm  08/09/2013   CLINICAL DATA:  exprssive aphasia  EXAM: CT ANGIOGRAPHY HEAD AND NECK  TECHNIQUE: Multidetector CT imaging of the head and neck was performed using the standard  protocol during bolus administration of intravenous contrast. Multiplanar CT image reconstructions and MIPs were obtained to evaluate the vascular anatomy. Carotid stenosis measurements (when applicable) are obtained utilizing NASCET criteria, using the distal internal carotid diameter as the denominator.  CONTRAST:  OMNIPAQUE IOHEXOL 350 MG/ML SOLN  COMPARISON:  Head CT same day.  MRI 05/17/2006.  FINDINGS: CTA HEAD FINDINGS  Both internal carotid arteries are widely patent through the siphon regions. No supra clinoid stenosis. The anterior and middle cerebral vessels appear normal without proximal stenosis, aneurysm or vascular malformation. Both vertebral arteries are patent to the basilar with the left being a very small vessel. No basilar stenosis. Posterior circulation branch vessels appear patent and normal.  Intracranial venous structures are patent.  Chronic ischemic changes are  seen at the temporoparietal junction region on the left.  Review of the MIP images confirms the above findings.  CTA NECK FINDINGS  Branching pattern of the brachiocephalic vessels from the arch is normal. No origin stenoses. Lung apices are clear.  Both common carotid arteries are widely patent to their respective bifurcation. Both carotid bifurcations appear normal without evidence of stenosis or irregularity. Both cervical internal carotid arteries are normal.  Both vertebral artery origins are widely patent. The right vertebral artery is larger than the left. Both vessels are patent through the cervical region.  Review of the MIP images confirms the above findings.  IMPRESSION: Negative study. No evidence of atherosclerotic narrowing or dissection. No vessel occlusion. No aneurysm or vascular malformation.   Electronically Signed   By: Paulina Fusi M.D.   On: 08/09/2013 19:01   Dg Chest 2 View  08/09/2013   CLINICAL DATA:  Stroke, hypertension, asthma.  EXAM: CHEST  2 VIEW  COMPARISON:  04/15/2012  FINDINGS: The heart size and mediastinal contours are within normal limits. Both lungs are clear. The visualized skeletal structures are unremarkable.  IMPRESSION: No active cardiopulmonary disease.   Electronically Signed   By: Charlett Nose M.D.   On: 08/09/2013 19:00   Ct Head Wo Contrast  08/09/2013   CLINICAL DATA:  New expressive aphasia, hx of CVA in the past  EXAM: CT HEAD WITHOUT CONTRAST  TECHNIQUE: Contiguous axial images were obtained from the base of the skull through the vertex without intravenous contrast.  COMPARISON:  Head CT 04/20/2008  FINDINGS: Stable area of chronic left temporoparietal infarction. No acute intracranial abnormality. Specifically, no hemorrhage, hydrocephalus, mass lesion, acute infarction, or significant intracranial injury. No acute calvarial abnormality. Visualized paranasal sinuses and mastoid air cells are patent.  IMPRESSION: Stable chronic left MCA distribution  infarction. No acute intracranial abnormality.   Electronically Signed   By: Salome Holmes M.D.   On: 08/09/2013 17:05   Ct Angio Neck W/cm &/or Wo/cm  08/09/2013   CLINICAL DATA:  exprssive aphasia  EXAM: CT ANGIOGRAPHY HEAD AND NECK  TECHNIQUE: Multidetector CT imaging of the head and neck was performed using the standard protocol during bolus administration of intravenous contrast. Multiplanar CT image reconstructions and MIPs were obtained to evaluate the vascular anatomy. Carotid stenosis measurements (when applicable) are obtained utilizing NASCET criteria, using the distal internal carotid diameter as the denominator.  CONTRAST:  OMNIPAQUE IOHEXOL 350 MG/ML SOLN  COMPARISON:  Head CT same day.  MRI 05/17/2006.  FINDINGS: CTA HEAD FINDINGS  Both internal carotid arteries are widely patent through the siphon regions. No supra clinoid stenosis. The anterior and middle cerebral vessels appear normal without proximal stenosis, aneurysm or vascular malformation. Both vertebral  arteries are patent to the basilar with the left being a very small vessel. No basilar stenosis. Posterior circulation branch vessels appear patent and normal.  Intracranial venous structures are patent.  Chronic ischemic changes are seen at the temporoparietal junction region on the left.  Review of the MIP images confirms the above findings.  CTA NECK FINDINGS  Branching pattern of the brachiocephalic vessels from the arch is normal. No origin stenoses. Lung apices are clear.  Both common carotid arteries are widely patent to their respective bifurcation. Both carotid bifurcations appear normal without evidence of stenosis or irregularity. Both cervical internal carotid arteries are normal.  Both vertebral artery origins are widely patent. The right vertebral artery is larger than the left. Both vessels are patent through the cervical region.  Review of the MIP images confirms the above findings.  IMPRESSION: Negative study. No  evidence of atherosclerotic narrowing or dissection. No vessel occlusion. No aneurysm or vascular malformation.   Electronically Signed   By: Paulina Fusi M.D.   On: 08/09/2013 19:01   Mr Brain Wo Contrast  08/10/2013   CLINICAL DATA:  Stroke  EXAM: MRI HEAD WITHOUT CONTRAST  MRA HEAD WITHOUT CONTRAST  TECHNIQUE: Multiplanar, multiecho pulse sequences of the brain and surrounding structures were obtained without intravenous contrast. Angiographic images of the head were obtained using MRA technique without contrast.  COMPARISON:  CT 08/09/2013  FINDINGS: MRI HEAD FINDINGS  Chronic infarct left parietal cortex. Chronic microvascular ischemic changes are present in the white matter bilaterally.  1 cm area of acute infarct in the left parietal operculum. Additional area of acute infarct in the left insula. Small area of acute infarct in the left lower parietal lobe inferior to the chronic cortical infarct.  Negative for hemorrhage or mass. Ventricle size is normal. No shift of the midline structures.  MRA HEAD FINDINGS  Right vertebral artery is widely patent and is normal. Hypoplastic left vertebral artery is small and irregular but does contribute to the basilar. Basilar widely patent. Superior cerebellar and posterior cerebral arteries are widely patent. Posterior communicating artery is patent bilaterally.  Anterior and middle cerebral arteries are widely patent bilaterally without stenosis.  Negative for cerebral aneurysm.  IMPRESSION: Chronic left parietal infarct. Chronic microvascular ischemic change. Extension of acute infarct into the left parietal operculum, left insula, and left inferior parietal lobe, surrounding the area of chronic infarction.  Small irregular distal left vertebral artery. 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   Electronically Signed   By: Marlan Palau M.D.   On: 08/10/2013 09:14   Mr Maxine Glenn Head/brain Wo  Cm  08/10/2013   CLINICAL DATA:  Stroke  EXAM: MRI HEAD WITHOUT CONTRAST  MRA HEAD WITHOUT CONTRAST  TECHNIQUE: Multiplanar, multiecho pulse sequences of the brain and surrounding structures were obtained without intravenous contrast. Angiographic images of the head were obtained using MRA technique without contrast.  COMPARISON:  CT 08/09/2013  FINDINGS: MRI HEAD FINDINGS  Chronic infarct left parietal cortex. Chronic microvascular ischemic changes are present in the white matter bilaterally.  1 cm area of acute infarct in the left parietal operculum. Additional area of acute infarct in the left insula. Small area of acute infarct in the left lower parietal lobe inferior to the chronic cortical infarct.  Negative for hemorrhage or mass. Ventricle size is normal. No shift of the midline structures.  MRA HEAD FINDINGS  Right vertebral artery is widely  patent and is normal. Hypoplastic left vertebral artery is small and irregular but does contribute to the basilar. Basilar widely patent. Superior cerebellar and posterior cerebral arteries are widely patent. Posterior communicating artery is patent bilaterally.  Anterior and middle cerebral arteries are widely patent bilaterally without stenosis.  Negative for cerebral aneurysm.  IMPRESSION: Chronic left parietal infarct. Chronic microvascular ischemic change. Extension of acute infarct into the left parietal operculum, left insula, and left inferior parietal lobe, surrounding the area of chronic infarction.  Small irregular distal left vertebral artery. 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   Electronically Signed   By: Marlan Palauharles  Clark M.D.   On: 08/10/2013 09:14    Scheduled Meds: . clopidogrel  75 mg Oral Daily  . enoxaparin (LOVENOX) injection  40 mg Subcutaneous Q24H  . insulin aspart  0-15 Units Subcutaneous TID WC  . insulin aspart  0-5 Units Subcutaneous QHS  . methimazole  5  mg Oral Daily  . simvastatin  20 mg Oral QHS   Continuous Infusions:  Antibiotics Given (last 72 hours)   None      Principal Problem:   CVA (cerebral infarction) Active Problems:   GERD (gastroesophageal reflux disease)   Hyperlipidemia   Hypertension   Dysarthria    Time spent: 30min    [facility] Glen Behavioral HospitalJOSEPH,Tida Saner  [facility] Pager [phone]. If 7PM-7AM, please contact night-coverage at [email or web address], password [email or web address] 08/10/2013, 12:13 PM  LOS: 1 day

## 2013-08-10 NOTE — Progress Notes (Signed)
Stroke Team Progress Note  HISTORY Chief Complaint: dysarthria  HPI:  Kara Mendoza is an 63 y.o. female, right handed, with a past medical history relevant for left MCA distribution infarct back in 2008 with residual language impairment, hypertension, dyslipidemia, tobacco abuse, [facility] for further evaluation and management of dysarthria.  She initially presented to [facility] because of difficulty expressing herself. She indicated that she had had speech trouble since her stroke in 2008, but yesterday around noon time she got very concerned because all of the sudden she couldn't get words out even knowing what she wanted to say, and also had left upper extremity weakness  and that problem has been present evert since.  Denies associated HA, vertigo, double vision, difficulty swallowing, confusion, or vision impairment. No chest pain or palpitations.  She lives alone and atient's daughter said that she called her yesterday around 2 pm and couldn't understand what she was trying to say.  CT/CTA brain showed no acute abnormality.  Takes plavix daily.  Patient c/o pain in the left hand states she has been recently diagnosed with carpel tunnel syndrome and has been taking percocet at home.  Date last known well: 08/08/13  Time last known well: unlear  tPA Given: no, out of the window  SUBJECTIVE Patient's family at bedside. Patient is complaining of severe pain in LUE on touching. Also significant stuttering speech. Pt had significant stress recently due to financial issue to pay rent.   OBJECTIVE Most recent Vital Signs: Filed Vitals:   08/10/13 0610 08/10/13 0924 08/10/13 1345 08/10/13 1642  BP: 120/64 141/72 141/61 154/80  Pulse: 70 74 68 67  Temp: 98 F (36.7 C) 98.4 F (36.9 C) 98.7 F (37.1 C) 98.6 F (37 C)  TempSrc: Oral Oral Oral Oral  Resp: 18 18 19 20   Height:      Weight:      SpO2: 97% 98% 98% 98%   CBG (last 3)   Recent Labs  08/10/13 0811 08/10/13 1157  08/10/13 1641  GLUCAP 106* 105* 93    IV Fluid Intake:     MEDICATIONS  . clopidogrel  75 mg Oral Daily  . enoxaparin (LOVENOX) injection  40 mg Subcutaneous Q24H  . insulin aspart  0-15 Units Subcutaneous TID WC  . insulin aspart  0-5 Units Subcutaneous QHS  . losartan  50 mg Oral Daily  . methimazole  5 mg Oral Daily  . pregabalin  50 mg Oral TID  . simvastatin  40 mg Oral QHS   PRN:  oxyCODONE-acetaminophen, senna-docusate, zolpidem  Diet:  Carb Control thin liquids Activity:  As tolerated DVT Prophylaxis:  Lovenox  CLINICALLY SIGNIFICANT STUDIES Basic Metabolic Panel:  Recent Labs Lab 08/09/13 1630 08/10/13 0848  NA 140 141  K 4.1 3.5*  CL 101 101  CO2 26 26  GLUCOSE 109* 142*  BUN 14 11  CREATININE 0.80 0.72  CALCIUM 10.2 9.8   Liver Function Tests:  Recent Labs Lab 08/09/13 1630  AST 14  ALT 16  ALKPHOS 69  BILITOT 0.6  PROT 7.5  ALBUMIN 4.1   CBC:  Recent Labs Lab 08/09/13 1630 08/10/13 0848  WBC 9.6 6.7  HGB 14.9 14.8  HCT 43.3 42.6  MCV 86.4 86.6  PLT 343 334   Coagulation:  Recent Labs Lab 08/09/13 1630  LABPROT 13.0  INR 0.98   Cardiac Enzymes:  Recent Labs Lab 08/09/13 1630  TROPONINI <0.30   Urinalysis:  Recent Labs Lab 08/09/13 1745  COLORURINE  YELLOW  LABSPEC 1.010  PHURINE 6.0  GLUCOSEU NEGATIVE  HGBUR NEGATIVE  BILIRUBINUR NEGATIVE  KETONESUR NEGATIVE  PROTEINUR NEGATIVE  UROBILINOGEN 0.2  NITRITE NEGATIVE  LEUKOCYTESUR LARGE*   Lipid Panel    Component Value Date/Time   CHOL 208* 08/10/2013 0848   TRIG 135 08/10/2013 0848   HDL 53 08/10/2013 0848   CHOLHDL 3.9 08/10/2013 0848   VLDL 27 08/10/2013 0848   LDLCALC 128* 08/10/2013 0848   HgbA1C  Lab Results  Component Value Date   HGBA1C 5.8* 08/10/2013    Urine Drug Screen:     Component Value Date/Time   LABOPIA NONE DETECTED 08/09/2013 1745   LABOPIA PPS 04/30/2012 1533   COCAINSCRNUR NONE DETECTED 08/09/2013 1745   COCAINSCRNUR NEG 04/30/2012 1533   LABBENZ  NONE DETECTED 08/09/2013 1745   LABBENZ NEG 04/30/2012 1533   AMPHETMU NONE DETECTED 08/09/2013 1745   THCU NONE DETECTED 08/09/2013 1745   LABBARB NONE DETECTED 08/09/2013 1745   LABBARB NEG 04/30/2012 1533    Alcohol Level: No results found for this basename: ETH,  in the last 168 hours      Dg Chest 2 View  08/09/2013   IMPRESSION: No active cardiopulmonary disease.     Ct Head Wo Contrast  08/09/2013   IMPRESSION: Stable chronic left MCA distribution infarction. No acute intracranial abnormality.     Ct Angio Head W/cm &/or Wo Cm  08/09/2013  IMPRESSION: Negative study. No evidence of atherosclerotic narrowing or dissection. No vessel occlusion. No aneurysm or vascular malformation.  MRI/MRA  08/10/2013   IMPRESSION: Chronic left parietal infarct. Chronic microvascular ischemic change. Extension of acute infarct into the left parietal operculum, left insula, and left inferior parietal lobe, surrounding the area of chronic infarction.  Small irregular distal left vertebral artery. 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      Carotid Doppler pending  2D Echocardiogram  08/10/13 EF 55-60%, no cardiac source of emboli identified   EKG  08/09/13 Sinus rhythm RAE, consider biatrial enlargement Anteroseptal infarct, old For complete results please see formal report.   Therapy Recommendations: PT/OT recs CIR   Physical Exam Blood pressure 154/80, pulse 67, temperature 98.6 F (37 C), temperature source Oral, resp. rate 20, height 5\' 2"  (1.575 m), weight 78.7 kg (173 lb 8 oz), SpO2 98.00%. Gen: Patient is well developed, overweight older woman in no acute distress.  Cardiac: RRR. S1S2 audible.   Extremities: Cap refill <2 secs. No cyanosis or edema. Pulses 2+ radial and DP. Significant pain on touch in LUE Pulmonary: Respirations regular, symmetric. Lungs clear to auscultation bilat. Abd: Soft, non-tender. BS audible x 4  quadrants.  G/U: Deferred  MS: Alert, follows commands. Oriented to person, place, time, and event. Speech: Speech significant stuttering and mildly dysarthric, but able to name and repeat.  CN: No visual field cut. PERRL. EOMs intact. Facial sensation intact V1-3. No facial droop. Hearing grossly intact. Strong cough. Sternocleidomastoids and trapezius 5/5 strength. Tongue midline, full strength, no atrophy or fasciculations.  Strength: 5/5 RUE, bilat LEs proximally and distally. LUE with weakness vs diminished effort due to pain.   Sensation: Intact to light touch in all four extremities. Hyperalgesia in LUE.  Coordination: No ataxia or dysmetria on FTN or HTS bilat. Did not assess gait or Romberg.  Reflexes: 1+ throughout but not able to test LUE due to pain NIHSS 2-3  ASSESSMENT/PLAN Ms. Kara Hammock  is a 63 y.o. female with prior hx of left MCA distribution infarct in 2008 with residual language impairment, hypertension, dyslipidemia, tobacco abuse, presenting with speech difficulty. Did not receive tPA due to unclear last known well. Imaging confirms chronic left parietal infarct with new extension into the left parietal operculum, left insula, and left inferior parietal lobe, surrounding the area of chronic infarction.  Chronic microvascular ischemic change. 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 Underlying etiology of infarct extension as yet undetermined.   On clopidogrel 75 mg orally every day prior to admission. Now on clopidogrel 75 mg orally every day for secondary stroke prevention. Patient presented with stuttering speech and RUE pain and fluctuating weakness. Stroke work up underway. Her stuttering is not associate with any neurological condition, likely due to recent stress. Left UE pain and hyperaglesia may consider RDS.  Stroke: Chronic L parietal infarct with new extension, etiology  of new extension as yet undetermined.   MRA and CTA do not show significant arterial stenosis  Recommend TEE and loop recorder if TEE negative.  Carotid Dopplers Pending  Echo shows normal EF, no source of emboli  LDL 128, on simvastatin 20 mg daily at home, inc to 40 mg daily  HgbA1c 5.8, mildly elevated  TSH 3.4 WNL  PT/OT recs CIR  Hypertension  On HCTZ and losartan at home  Restarted losartan  BP 120s-150s/50s-80s  ? RDS - LUE Recommend DVT screening for LUE [facility]  Hospital day # 1  SIGNED Stephani Policeeirdre Fraller, MSN, ANP-C, CNRN, MSCS [facility] St[facility] [phone]  I, the attending vascular neurologist, have personally obtained a history, examined the patient, evaluated laboratory data and imaging studies, and formulated the assessment and plan of care.  I have made any additions or clarifications directly to the above note and agree with the findings and plan as currently documented.   Marvel PlanJindong Berit Raczkowski, MD PhD 08/10/2013 10:42 PM       To contact Stroke Continuity provider, please refer to [email or web address]. After hours, contact General Neurology

## 2013-08-10 NOTE — Progress Notes (Signed)
Rehab Admissions Coordinator Note:  Patient was screened by Trish MageLogue, Ronte Parker M for appropriateness for an Inpatient Acute Rehab Consult.  At this time, we are recommending Inpatient Rehab consult.  Lelon FrohlichLogue, Leiann Sporer M 08/10/2013, 1:30 PM  I can be reached at [phone].

## 2013-08-10 NOTE — Progress Notes (Signed)
VASCULAR LAB    Cancel Carotid Doppler per Dr. Jomarie LongsJoseph as patient had normal CTA.      Varsha Knock, RVT 08/10/2013, 1:35 PM

## 2013-08-11 DIAGNOSIS — I6340 Cerebral infarction due to embolism of unspecified cerebral artery: Secondary | ICD-10-CM

## 2013-08-11 LAB — HEPATIC FUNCTION PANEL
ALT: 15 U/L (ref 0–35)
AST: 14 U/L (ref 0–37)
Albumin: 3.7 g/dL (ref 3.5–5.2)
Alkaline Phosphatase: 59 U/L (ref 39–117)
Bilirubin, Direct: <0.2 mg/dL (ref 0.0–0.3)
Total Bilirubin: 0.6 mg/dL (ref 0.3–1.2)
Total Protein: 6.6 g/dL (ref 6.0–8.3)

## 2013-08-11 LAB — CBC
HCT: 41.3 % (ref 36.0–46.0)
Hemoglobin: 13.7 g/dL (ref 12.0–15.0)
MCH: 29.3 pg (ref 26.0–34.0)
MCHC: 33.2 g/dL (ref 30.0–36.0)
MCV: 88.4 fL (ref 78.0–100.0)
Platelets: 321 10*3/uL (ref 150–400)
RBC: 4.67 MIL/uL (ref 3.87–5.11)
RDW: 12.8 % (ref 11.5–15.5)
WBC: 7.7 10*3/uL (ref 4.0–10.5)

## 2013-08-11 LAB — BASIC METABOLIC PANEL
Anion gap: 13 (ref 5–15)
BUN: 11 mg/dL (ref 6–23)
CO2: 26 mEq/L (ref 19–32)
Calcium: 9.8 mg/dL (ref 8.4–10.5)
Chloride: 103 mEq/L (ref 96–112)
Creatinine, Ser: 0.8 mg/dL (ref 0.50–1.10)
GFR calc Af Amer: 88 mL/min — ABNORMAL LOW (ref >90)
GFR calc non Af Amer: 76 mL/min — ABNORMAL LOW (ref >90)
Glucose, Bld: 111 mg/dL — ABNORMAL HIGH (ref 70–99)
Potassium: 4 mEq/L (ref 3.7–5.3)
Sodium: 142 mEq/L (ref 137–147)

## 2013-08-11 LAB — GLUCOSE, CAPILLARY
Glucose-Capillary: 109 mg/dL — ABNORMAL HIGH (ref 70–99)
Glucose-Capillary: 110 mg/dL — ABNORMAL HIGH (ref 70–99)
Glucose-Capillary: 114 mg/dL — ABNORMAL HIGH (ref 70–99)
Glucose-Capillary: 125 mg/dL — ABNORMAL HIGH (ref 70–99)

## 2013-08-11 LAB — RHEUMATOID FACTOR: Rhuematoid fact SerPl-aCnc: <10 IU/mL (ref <=14)

## 2013-08-11 LAB — SEDIMENTATION RATE: Sed Rate: 3 mm/hr (ref 0–22)

## 2013-08-11 LAB — RPR

## 2013-08-11 LAB — FOLATE: Folate: >20 ng/mL

## 2013-08-11 LAB — HOMOCYSTEINE: Homocysteine: 6.5 umol/L (ref 4.0–15.4)

## 2013-08-11 LAB — HIV ANTIBODY (ROUTINE TESTING W REFLEX): HIV 1&2 Ab, 4th Generation: NONREACTIVE

## 2013-08-11 LAB — HIGH SENSITIVITY CRP: CRP, High Sensitivity: 1.3 mg/L

## 2013-08-11 LAB — VITAMIN B12: Vitamin B-12: 1016 pg/mL — ABNORMAL HIGH (ref 211–911)

## 2013-08-11 MED ORDER — SODIUM CHLORIDE 0.9 % IV SOLN
INTRAVENOUS | Status: DC
  Administered 2013-08-12: 10:00:00 via INTRAVENOUS

## 2013-08-11 NOTE — Evaluation (Signed)
Speech Language Pathology Evaluation Patient Details Name: Kara Mendoza MRN: [id number] DOB: [birth year] Today's Date: 08/11/2013 Time: [id number] SLP Time Calculation (min): 31 min  Problem List:  Patient Active Problem List   Diagnosis Date Noted  . CVA (cerebral infarction) 08/09/2013  . Dysarthria 08/09/2013  . Abnormality of gait 05/01/2012  . Incontinence of urine 05/01/2012  . Pneumonia 03/10/2012  . Tobacco abuse 01/28/2012  . Fatigue 01/01/2011  . Weakness generalized 11/03/2010  . Headache 09/01/2010  . Plantar fasciitis 09/01/2010  . Allergic rhinitis 07/11/2010  . Persistent cough 06/16/2010  . Asthma night-time symptoms 06/14/2010  . GERD (gastroesophageal reflux disease) 05/26/2010  . Stroke 05/26/2010  . Hyperlipidemia 05/26/2010  . Hypertension 05/26/2010  . Neuropathy 05/26/2010  . Osteoarthritis 05/26/2010   Past Medical History:  Past Medical History  Diagnosis Date  . Stroke   . Neuromuscular disorder   . Hypertension   . Hyperlipidemia   . Asthma   . Diabetes mellitus   . Hyperthyroidism   . Esophageal reflux   . Constipation   . Major depressive disorder, recurrent episode, moderate   . Anxiety state, unspecified   . History of fall   . Abnormality of gait   . Chronic pain syndrome   . Tobacco use disorder   . Osteoarthritis   . Dysarthria   . Acute bronchitis   . Chronic arm pain 2008    left  . Chronic leg pain 2008    left  . Fatigue   . Chronic headache    Past Surgical History:  Past Surgical History  Procedure Laterality Date  . Cesarean section      x3  . Finger surgery     HPI:  Kara Mendoza is a 63 y.o. female with a past medical history of a left temporal parietal infarction back in 2008 at which time she presented with expressive aphasia, has history of hypertension, dyslipidemia, who presents to the emergency room with complaints of dysarthria. Per MD report, patient having difficulties getting her words out with  ongoing dysarthria. Patient thinks that symptoms may have started 7/5  between 12 and 1:00 in the afternoon while at home, that came on with left upper extremity weakness. She lives alone, family members could not confirm exact time symptoms started. They called her this afternoon around 2 pm when they noted her speech impairment, having difficulties understanding her on the telephone. A CT scan of brain performed in the emergency room showed stable chronic left MCA distribution infarction without acute intracranial abnormality. MRI on 7/5 revealed chronic left parietal infarct, chronic microvascular ischemic change, extension of acute infarct into the left parietal operculum, left insula, and left inferior parietal lobe, surrounding the area of chronic infarction. Small irregular distal left vertebral artery. 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 Speech eval ordered as part of stroke workup.   Assessment / Plan / Recommendation Clinical Impression  Pt currently demonstrates an expressive aphasia and significant stutter, primarily characterized by word finding difficulties at conversation level, groping for words, and prolongations/ repetitions of initial sounds. Pt had baseline expressive aphasia, but she and son in room report that current difficulties are worse than baseline. Speech characteristics when reading aloud similar to conversational speech; however pt also expressed that her vision has worsened upon this admission. Receptive language skills and overall cognition intact for tasks assessed. No dysarthria present today. Pt is aware of difficulties  and is currently frustrated, stating that she feels she cannot communicate when she needs to and that her speech "comes and goes." Given these findings, pt is likely to have difficulties expressing her wants and needs effectively, and there is a clear negative impact on quality of  life. ST will continue to follow, and pt would benefit from continued speech therapy at next level of care to address expressive language and stuttering.    SLP Assessment  Patient needs continued Speech Lanaguage Pathology Services    Follow Up Recommendations  Inpatient Rehab    Frequency and Duration min 2x/week  2 weeks   Pertinent Vitals/Pain n/a   SLP Goals  SLP Goals Potential to Achieve Goals: Fair Potential Considerations: Previous level of function Progress/Goals/Alternative treatment plan discussed with pt/caregiver and they: Agree  SLP Evaluation Prior Functioning  Cognitive/Linguistic Baseline: Baseline deficits Baseline deficit details: persistent expressive aphasia from stroke in 2008 Type of Home: House Available Help at Discharge: Family;Personal care attendant   Cognition  Overall Cognitive Status: Within Functional Limits for tasks assessed Arousal/Alertness: Awake/alert Orientation Level: Oriented X4 (Simultaneous filing. User may not have seen previous data.) Attention: Selective Selective Attention: Appears intact Memory: Appears intact Awareness: Appears intact Problem Solving: Appears intact Safety/Judgment: Appears intact    Comprehension  Auditory Comprehension Overall Auditory Comprehension: Appears within functional limits for tasks assessed Yes/No Questions: Within Functional Limits Commands: Within Functional Limits Conversation: Simple Reading Comprehension Reading Status: Impaired Word level: Impaired Sentence Level: Impaired Paragraph Level: Impaired Interfering Components: Eye glasses not available;Other (comment) (reports new vision difficulties on this admission) Effective Techniques: Large print;Tactile cueing    Expression Expression Primary Mode of Expression: Verbal Verbal Expression Overall Verbal Expression: Impaired Initiation: Impaired Level of Generative/Spontaneous Verbalization: Conversation Repetition:  Impaired Level of Impairment: Sentence level Naming: No impairment Pragmatics: No impairment Interfering Components: Other (comment) (new stutter on this admission) Effective Techniques: Phonemic cues Non-Verbal Means of Communication: Not applicable Written Expression Dominant Hand: Right Written Expression: Exceptions to [facility]FL   Oral / Motor Oral Motor/Sensory Function Overall Oral Motor/Sensory Function: Appears within functional limits for tasks assessed Motor Speech Overall Motor Speech: Impaired Respiration: Within functional limits Phonation: Normal Resonance: Within functional limits Articulation: Within functional limitis Intelligibility: Intelligible Motor Planning: Impaired Level of Impairment: Sentence Motor Speech Errors: Aware;Groping for words   GO     [place] KungOleksiak, Vincenza Dail K, MA, CCC-SLP 08/11/2013, 9:12 AM

## 2013-08-11 NOTE — Progress Notes (Signed)
Rehab admissions - Evaluated for possible admission.  I met with patient.  She does not want to go to a SNF.  She does live alone.  She has [employer] and we will need to seek authorization for acute inpatient rehab admission.  Once TEE is completed, then can seek inpatient rehab admission.  I will follow up in am for plans.  Call me for questions.  #[phone]

## 2013-08-11 NOTE — Progress Notes (Signed)
Stroke Team Progress Note  HISTORY Chief Complaint: dysarthria  HPI:  Kara Mendoza is an 63 y.o. female, right handed, with a past medical history relevant for left MCA distribution infarct back in 2008 with residual language impairment, hypertension, dyslipidemia, tobacco abuse, transferred to [facility] for further evaluation and management of dysarthria.  She initially presented to [facility] because of difficulty expressing herself. She indicated that she had had speech trouble since her stroke in 2008, but yesterday around noon time she got very concerned because all of the sudden she couldn't get words out even knowing what she wanted to say, and also had left upper extremity weakness  and that problem has been present evert since.  Denies associated HA, vertigo, double vision, difficulty swallowing, confusion, or vision impairment. No chest pain or palpitations.  She lives alone and atient's daughter said that she called her yesterday around 2 pm and couldn't understand what she was trying to say.  CT/CTA brain showed no acute abnormality.  Takes plavix daily.  Patient c/o pain in the left hand states she has been recently diagnosed with carpel tunnel syndrome and has been taking percocet at home.  Date last known well: 08/08/13  Time last known well: unlear  tPA Given: no, out of the window  SUBJECTIVE Patient's family at bedside. Patient continues to complain of severe pain in LUE on touching, although states it has improved somewhat since restarting home Lyrica. Also stuttering speech but improving. Pt had significant stress recently due to financial issue to pay rent.   OBJECTIVE Most recent Vital Signs: Filed Vitals:   08/10/13 1642 08/10/13 2131 08/11/13 0230 08/11/13 0703  BP: 154/80 151/67 136/73 100/54  Pulse: 67 66 68 63  Temp: 98.6 F (37 C) 98.6 F (37 C) 98 F (36.7 C) 98.2 F (36.8 C)  TempSrc: Oral Oral Oral Oral  Resp: 20 18 18 18   Height:      Weight:      SpO2: 98% 99%  97% 97%   CBG (last 3)   Recent Labs  08/10/13 1641 08/10/13 2133 08/11/13 0705  GLUCAP 93 161* 109*    IV Fluid Intake:     MEDICATIONS  . clopidogrel  75 mg Oral Daily  . enoxaparin (LOVENOX) injection  40 mg Subcutaneous Q24H  . insulin aspart  0-15 Units Subcutaneous TID WC  . insulin aspart  0-5 Units Subcutaneous QHS  . losartan  50 mg Oral Daily  . methimazole  5 mg Oral Daily  . pregabalin  50 mg Oral TID  . simvastatin  40 mg Oral QHS   PRN:  oxyCODONE-acetaminophen, senna-docusate, zolpidem  Diet:  Carb Control thin liquids Activity:  As tolerated DVT Prophylaxis:  Lovenox  CLINICALLY SIGNIFICANT STUDIES Basic Metabolic Panel:   Recent Labs Lab 08/10/13 0848 08/11/13 0420  NA 141 142  K 3.5* 4.0  CL 101 103  CO2 26 26  GLUCOSE 142* 111*  BUN 11 11  CREATININE 0.72 0.80  CALCIUM 9.8 9.8   Liver Function Tests:   Recent Labs Lab 08/09/13 1630 08/11/13 0420  AST 14 14  ALT 16 15  ALKPHOS 69 59  BILITOT 0.6 0.6  PROT 7.5 6.6  ALBUMIN 4.1 3.7   CBC:   Recent Labs Lab 08/10/13 0848 08/11/13 0420  WBC 6.7 7.7  HGB 14.8 13.7  HCT 42.6 41.3  MCV 86.6 88.4  PLT 334 321   Coagulation:   Recent Labs Lab 08/09/13 1630  LABPROT 13.0  INR 0.98  Cardiac Enzymes:   Recent Labs Lab 08/09/13 1630  TROPONINI <0.30   Urinalysis:   Recent Labs Lab 08/09/13 1745  COLORURINE YELLOW  LABSPEC 1.010  PHURINE 6.0  GLUCOSEU NEGATIVE  HGBUR NEGATIVE  BILIRUBINUR NEGATIVE  KETONESUR NEGATIVE  PROTEINUR NEGATIVE  UROBILINOGEN 0.2  NITRITE NEGATIVE  LEUKOCYTESUR LARGE*   Lipid Panel    Component Value Date/Time   CHOL 208* 08/10/2013 0848   TRIG 135 08/10/2013 0848   HDL 53 08/10/2013 0848   CHOLHDL 3.9 08/10/2013 0848   VLDL 27 08/10/2013 0848   LDLCALC 128* 08/10/2013 0848   HgbA1C  Lab Results  Component Value Date   HGBA1C 5.8* 08/10/2013    Urine Drug Screen:     Component Value Date/Time   LABOPIA NONE DETECTED 08/09/2013 1745    LABOPIA PPS 04/30/2012 1533   COCAINSCRNUR NONE DETECTED 08/09/2013 1745   COCAINSCRNUR NEG 04/30/2012 1533   LABBENZ NONE DETECTED 08/09/2013 1745   LABBENZ NEG 04/30/2012 1533   AMPHETMU NONE DETECTED 08/09/2013 1745   THCU NONE DETECTED 08/09/2013 1745   LABBARB NONE DETECTED 08/09/2013 1745   LABBARB NEG 04/30/2012 1533    Alcohol Level: No results found for this basename: ETH,  in the last 168 hours      Dg Chest 2 View  08/09/2013   IMPRESSION: No active cardiopulmonary disease.     Ct Head Wo Contrast  08/09/2013   IMPRESSION: Stable chronic left MCA distribution infarction. No acute intracranial abnormality.     Ct Angio Head W/cm &/or Wo Cm  08/09/2013  IMPRESSION: Negative study. No evidence of atherosclerotic narrowing or dissection. No vessel occlusion. No aneurysm or vascular malformation.  MRI/MRA  08/10/2013   IMPRESSION: Chronic left parietal infarct. Chronic microvascular ischemic change. Extension of acute infarct into the left parietal operculum, left insula, and left inferior parietal lobe, surrounding the area of chronic infarction.  Small irregular distal left vertebral artery. 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      Carotid Doppler pending  2D Echocardiogram  08/10/13 EF 55-60%, no cardiac source of emboli identified   EKG  08/09/13 Sinus rhythm RAE, consider biatrial enlargement Anteroseptal infarct, old For complete results please see formal report.   Therapy Recommendations: PT/OT recs CIR   Physical Exam Blood pressure 100/54, pulse 63, temperature 98.2 F (36.8 C), temperature source Oral, resp. rate 18, height 5\' 2"  (1.575 m), weight 78.7 kg (173 lb 8 oz), SpO2 97.00%. Gen: Patient is well developed, overweight older woman in no acute distress.  Cardiac: RRR. S1S2 audible.   Extremities: Cap refill <2 secs. No cyanosis or edema. Pulses 2+ radial and DP. Significant pain on touch in  LUE Pulmonary: Respirations regular, symmetric. Lungs clear to auscultation bilat. Abd: Soft, non-tender. BS audible x 4 quadrants.  G/U: Deferred  MS: Alert, follows commands. Oriented to person, place, time, and event. Speech: Speech significant stuttering and mildly dysarthric, but able to name and repeat.  CN: No visual field cut. PERRL. EOMs intact. Facial sensation intact V1-3. No facial droop. Hearing grossly intact. Strong cough. Sternocleidomastoids and trapezius 5/5 strength. Tongue midline, full strength, no atrophy or fasciculations.  Strength: 5/5 RUE, bilat LEs proximally and distally. LUE with weakness vs diminished effort due to pain.   Sensation: Intact to light touch in all four extremities. Hyperalgesia in LUE.  Coordination: No ataxia or dysmetria on FTN or HTS bilat. Did not assess  gait or Romberg.  Reflexes: 1+ throughout but not able to test LUE due to pain  NIHSS 2-3  ASSESSMENT/PLAN Kara Mendoza is a 63 y.o. female with prior hx of left MCA distribution infarct in 2008 with residual language impairment, hypertension, dyslipidemia, tobacco abuse, presenting with speech difficulty. Did not receive tPA due to unclear last known well. Imaging confirms chronic left parietal infarct with new extension into the left parietal operculum, left insula, and left inferior parietal lobe, surrounding the area of chronic infarction.  Chronic microvascular ischemic change. 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 Underlying etiology of infarct extension as yet undetermined. On clopidogrel 75 mg orally every day prior to admission. Now on clopidogrel 75 mg orally every day for secondary stroke prevention. Patient presented with stuttering speech and RUE pain and fluctuating weakness. Stroke work up so far negative except high LDL level. Will do hypercoagulable work up and TEE with loop  recorder. Her stuttering is not associate with any neurological condition, likely due to recent stress. Left UE pain and hyperaglesia may consider RDS.  Stroke: Chronic L parietal infarct with new extension, etiology of new extension as yet undetermined.   MRA and CTA do not show significant arterial stenosis  Recommend TEE and loop recorder.  Carotid Dopplers Pending  Echo shows normal EF, no source of emboli  LDL 128, on simvastatin 20 mg daily at home, inc to 40 mg daily  HgbA1c 5.8, mildly elevated  TSH 3.4 WNL  PT/OT recs CIR  Ordered hypercoag workup  Hypertension  On HCTZ and losartan at home  Restarted losartan  BP 100s-150s/50s-80s  ? RDS - LUE Recommend DVT screening for LUE Continue lyrica Need outpt follow up with PCP and pain managment  Hospital day # 2  SIGNED Stephani Policeeirdre Fraller, MSN, ANP-C, Driscilla GrammesCNRN, MSCS [place] Stroke Team [phone] 08/11/2013 7:57 AM  I, the attending vascular neurologist, have personally obtained a history, examined the patient, evaluated laboratory data and imaging studies, and formulated the assessment and plan of care.  I have made any additions or clarifications directly to the above note and agree with the findings and plan as currently documented.   Marvel PlanJindong Estha Few, MD PhD 08/11/2013 10:28 PM        To contact Stroke Continuity provider, please refer to [email or web address]. After hours, contact General Neurology

## 2013-08-11 NOTE — Consult Note (Addendum)
Physical Medicine and Rehabilitation Consult Reason for Consult: CVA Referring Physician: [facility]   HPI: Kara HammockSheila L Mendoza is a 63 y.o. 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 Patient independent with a cane and walker prior to admission living alone. Admitted 08/09/2013 with increased aphasia. MRI of the brain showed extension of acute infarcts left parietal operculum, left insula and inferior parietal lobes. MRA of the head without occlusion. CTA of head and neck negative. Echocardiogram with ejection fraction of 60% no wall motion abnormalities. Patient did not receive TPA. Neurology followup currently continues on Plavix for CVA prophylaxis as well as subcutaneous Lovenox for DVT prophylaxis. Physical therapy evaluation 08/10/2013 with recommendations of physical medicine rehabilitation consult.  Review of Systems  Constitutional: Positive for malaise/fatigue.  Gastrointestinal: Positive for constipation.  Musculoskeletal:       Chronic left arm pain  Neurological: Positive for speech change, weakness and headaches.  Psychiatric/Behavioral: Positive for depression.       Anxiety  All other systems reviewed and are negative.  Past Medical History  Diagnosis Date  . Stroke   . Neuromuscular disorder   . Hypertension   . Hyperlipidemia   . Asthma   . Diabetes mellitus   . Hyperthyroidism   . Esophageal reflux   . Constipation   . Major depressive disorder, recurrent episode, moderate   . Anxiety state, unspecified   . History of fall   . Abnormality of gait   . Chronic pain syndrome   . Tobacco use disorder   . Osteoarthritis   . Dysarthria   . Acute bronchitis   . Chronic arm pain 2008    left  . Chronic leg pain 2008    left  . Fatigue   . Chronic headache    Past Surgical  History  Procedure Laterality Date  . Cesarean section      x3  . Finger surgery     Family History  Problem Relation Age of Onset  . Diabetes Sister   . Cancer Father   . Diabetes Brother    Social History:  reports that she has been smoking Cigarettes.  She has a 3 pack-year smoking history. She has never used smokeless tobacco. She reports that she does not drink alcohol or use illicit drugs. Allergies:  Allergies  Allergen Reactions  . Aspirin Other (See Comments)    Possibly itching and "feeling little weird"  . Penicillins Hives   Medications Prior to Admission  Medication Sig Dispense Refill  . albuterol (PROVENTIL HFA;VENTOLIN HFA) 108 (90 BASE) MCG/ACT inhaler Inhale 3 puffs into the lungs every 6 (six) hours as needed for wheezing or shortness of breath.       . buprenorphine (BUTRANS) 10 MCG/HR PTWK patch Place 10 mcg onto the skin once a week.      . meloxicam (MOBIC) 7.5 MG tablet Take 7.5 mg by mouth daily.      . metFORMIN (GLUCOPHAGE) 500 MG tablet Take 500 mg by mouth daily.      Marland Kitchen. oxyCODONE-acetaminophen (PERCOCET) 10-325 MG per tablet Take 1 tablet by mouth every 6 (six) hours as needed. For pain      . oxyCODONE-acetaminophen (PERCOCET/ROXICET) 5-325 MG per tablet Take 1 tablet by mouth every 6 (six) hours as needed for severe pain.      Marland Kitchen. sertraline (ZOLOFT) 50 MG tablet  Take 1 tablet by mouth at bedtime.      . Tapentadol HCl (NUCYNTA) 100 MG TABS Take 50 mg by mouth 2 (two) times daily.      . clopidogrel (PLAVIX) 75 MG tablet Take 1 tablet by mouth daily.      . diclofenac (VOLTAREN) 75 MG EC tablet Take 75 mg by mouth 2 (two) times daily.      . diclofenac sodium (VOLTAREN) 1 % GEL Apply 4 g topically 4 (four) times daily as needed.      . hydrochlorothiazide (HYDRODIURIL) 50 MG tablet Take 50 mg by mouth daily.      Marland Kitchen losartan (COZAAR) 100 MG tablet Take 1 tablet by mouth daily.      Marland Kitchen losartan-hydrochlorothiazide (HYZAAR) 100-25 MG per tablet Take 1  tablet by mouth daily.      . methimazole (TAPAZOLE) 5 MG tablet Take 5 mg by mouth daily.      . Multiple Vitamins-Calcium (ONE-A-DAY WOMENS FORMULA PO) Take 1 tablet by mouth daily.      Marland Kitchen omeprazole (PRILOSEC) 20 MG capsule Take 20 mg by mouth 2 (two) times daily.      . pregabalin (LYRICA) 50 MG capsule Take 50 mg by mouth 3 (three) times daily.      . sennosides-docusate sodium (SENOKOT-S) 8.6-50 MG tablet Take 1 tablet by mouth daily.      . simvastatin (ZOCOR) 20 MG tablet Take 10 mg by mouth at bedtime. 1/2 tab qd      . zolpidem (AMBIEN) 5 MG tablet Take 5 mg by mouth at bedtime.        Home: Home Living Family/patient expects to be discharged to:: Private residence Living Arrangements: Alone Available Help at Discharge: Family;Personal care attendant Type of Home: House Home Access: Stairs to enter [employer] of Steps: 6 Entrance Stairs-Rails: Right Home Layout: One level Home Equipment: Walker - 2 wheels;Cane - single point  Functional History: Prior Function Level of Independence: Needs assistance Gait / Transfers Assistance Needed: uses cane, but has not previously needed physical support ADL's / Homemaking Assistance Needed: Nurse aide assistance 2 hours a day to help with B/D since about 9 months ago when she started having pain in her LUE (which has continued to get worse), up until then the aid mainly helped her around the house for IADLs Functional Status:  Mobility: Bed Mobility Overal bed mobility: Needs Assistance Bed Mobility: Supine to Sit Supine to sit: Min guard (with heavy use of the rail) General bed mobility comments: heavy use of the rail Transfers Overall transfer level: Needs assistance Equipment used: 1 person hand held assist;Quad cane Transfers: Sit to/from Stand Sit to Stand: Min assist General transfer comment: slow and guarded Ambulation/Gait Ambulation/Gait assistance: Min assist Ambulation Distance (Feet): 230 Feet Assistive  device: Quad cane Gait Pattern/deviations: Step-through pattern Gait velocity: slower General Gait Details: mildly unsteady with quad cane and mildly paretic on the left    ADL: ADL Overall ADL's : Needs assistance/impaired Eating/Feeding: Set up;Sitting Grooming: Minimal assistance;Sitting Upper Body Bathing: Minimal assitance;Sitting Lower Body Bathing: Moderate assistance;Sit to/from stand Upper Body Dressing : Moderate assistance;Sitting Lower Body Dressing: Maximal assistance;Sit to/from stand Toilet Transfer: Minimal assistance;Ambulation (one person HHA (due to could not use RW due to decreased use of LUE); bed>around to other side>back around and sit where we startd) Toileting- Clothing Manipulation and Hygiene: Total assistance;Sit to/from stand General ADL Comments: Pt with minimal edema Lt. hand when compared to Rt.  Pt with complaint  of pain Lt hand to shoulder.  Unable to describe, but when asked, she endorses sharp, burning sensation.  Pt is guarded with movment, but demonstrates isolated movement throughout Lt. UE.  Pt performe shoulder flexion and abduction to ~90-100 x 8 reps with encouragement.  Requires increased time.  When asked to make a fist, pt flexes at the PIPs.  difficult to get her to flex MCPs, however, when she engages in functional tasks full fist noted.  Pt winces and cries with the slightest touch to Lt. UE however, when she was showing therapist her palms, she reached with Lt. UE to rub Rt palm with no guarding or indication of pain - fluid movement noted.  She also did the same when adjusting wrist bracelet on Lt. UE.    Cognition: Cognition Overall Cognitive Status: Within Functional Limits for tasks assessed Arousal/Alertness: Awake/alert Orientation Level: Oriented X4 (Simultaneous filing. User may not have seen previous data.) Attention: Selective Selective Attention: Appears intact Memory: Appears intact Awareness: Appears intact Problem Solving:  Appears intact Safety/Judgment: Appears intact Cognition Arousal/Alertness: Awake/alert Behavior During Therapy: WFL for tasks assessed/performed Overall Cognitive Status: Within Functional Limits for tasks assessed  Blood pressure 133/58, pulse 68, temperature 97.8 F (36.6 C), temperature source Axillary, resp. rate 18, height 5\' 2"  (1.575 m), weight 78.7 kg (173 lb 8 oz), SpO2 96.00%. Physical Exam  Eyes: EOM are normal.  Neck: Normal range of motion. Neck supple. No thyromegaly present.  Cardiovascular: Normal rate and regular rhythm.   Respiratory: Effort normal and breath sounds normal. No respiratory distress.  GI: Soft. Bowel sounds are normal. She exhibits no distension.  Musculoskeletal:  Noted hypersensitivity to left forearm to palpation she is quite guarded  Neurological: She is alert.  Patient is a bit anxious and makes good eye contact with examiner. Stuttering speech. LUE grossly 2 to 2+/5. Pain inhibition component. LLE is 3 to 4-/5 prox to distal. Sensation 1/2 LUE with dyesthesisas. Speech dysarthric. Left facial weakness also.      she does mix with aphasia as well as a significant stutter and word finding difficulties. She did follow simple commands.  Results for orders placed during the hospital encounter of 08/09/13 (from the past 24 hour(s))  GLUCOSE, CAPILLARY     Status: Abnormal   Collection Time    08/10/13 11:57 AM      Result Value Ref Range   Glucose-Capillary 105 (*) 70 - 99 mg/dL  GLUCOSE, CAPILLARY     Status: None   Collection Time    08/10/13  4:41 PM      Result Value Ref Range   Glucose-Capillary 93  70 - 99 mg/dL  GLUCOSE, CAPILLARY     Status: Abnormal   Collection Time    08/10/13  9:33 PM      Result Value Ref Range   Glucose-Capillary 161 (*) 70 - 99 mg/dL   Comment 1 Notify RN     Comment 2 Documented in Chart    CBC     Status: None   Collection Time    08/11/13  4:20 AM      Result Value Ref Range   WBC 7.7  4.0 - 10.5 K/uL    RBC 4.67  3.87 - 5.11 MIL/uL   Hemoglobin 13.7  12.0 - 15.0 g/dL   HCT 16.141.3  09.636.0 - 04.546.0 %   MCV 88.4  78.0 - 100.0 fL   MCH 29.3  26.0 - 34.0 pg   MCHC 33.2  30.0 -  36.0 g/dL   RDW 16.1  09.6 - 04.5 %   Platelets 321  150 - 400 K/uL  BASIC METABOLIC PANEL     Status: Abnormal   Collection Time    08/11/13  4:20 AM      Result Value Ref Range   Sodium 142  137 - 147 mEq/L   Potassium 4.0  3.7 - 5.3 mEq/L   Chloride 103  96 - 112 mEq/L   CO2 26  19 - 32 mEq/L   Glucose, Bld 111 (*) 70 - 99 mg/dL   BUN 11  6 - 23 mg/dL   Creatinine, Ser 4.09  0.50 - 1.10 mg/dL   Calcium 9.8  8.4 - 81.1 mg/dL   GFR calc non Af Amer 76 (*) >90 mL/min   GFR calc Af Amer 88 (*) >90 mL/min   Anion gap 13  5 - 15  HEPATIC FUNCTION PANEL     Status: None   Collection Time    08/11/13  4:20 AM      Result Value Ref Range   Total Protein 6.6  6.0 - 8.3 g/dL   Albumin 3.7  3.5 - 5.2 g/dL   AST 14  0 - 37 U/L   ALT 15  0 - 35 U/L   Alkaline Phosphatase 59  39 - 117 U/L   Total Bilirubin 0.6  0.3 - 1.2 mg/dL   Bilirubin, Direct <9.1  0.0 - 0.3 mg/dL   Indirect Bilirubin NOT CALCULATED  0.3 - 0.9 mg/dL  GLUCOSE, CAPILLARY     Status: Abnormal   Collection Time    08/11/13  7:05 AM      Result Value Ref Range   Glucose-Capillary 109 (*) 70 - 99 mg/dL   Comment 1 Notify RN     Comment 2 Documented in Chart     Ct Angio Head W/cm &/or Wo Cm  08/09/2013   CLINICAL DATA:  exprssive aphasia  EXAM: CT ANGIOGRAPHY HEAD AND NECK  TECHNIQUE: Multidetector CT imaging of the head and neck was performed using the standard protocol during bolus administration of intravenous contrast. Multiplanar CT image reconstructions and MIPs were obtained to evaluate the vascular anatomy. Carotid stenosis measurements (when applicable) are obtained utilizing NASCET criteria, using the distal internal carotid diameter as the denominator.  CONTRAST:  OMNIPAQUE IOHEXOL 350 MG/ML SOLN  COMPARISON:  Head CT same day.  MRI  05/17/2006.  FINDINGS: CTA HEAD FINDINGS  Both internal carotid arteries are widely patent through the siphon regions. No supra clinoid stenosis. The anterior and middle cerebral vessels appear normal without proximal stenosis, aneurysm or vascular malformation. Both vertebral arteries are patent to the basilar with the left being a very small vessel. No basilar stenosis. Posterior circulation branch vessels appear patent and normal.  Intracranial venous structures are patent.  Chronic ischemic changes are seen at the temporoparietal junction region on the left.  Review of the MIP images confirms the above findings.  CTA NECK FINDINGS  Branching pattern of the brachiocephalic vessels from the arch is normal. No origin stenoses. Lung apices are clear.  Both common carotid arteries are widely patent to their respective bifurcation. Both carotid bifurcations appear normal without evidence of stenosis or irregularity. Both cervical internal carotid arteries are normal.  Both vertebral artery origins are widely patent. The right vertebral artery is larger than the left. Both vessels are patent through the cervical region.  Review of the MIP images confirms the above findings.  IMPRESSION: Negative  study. No evidence of atherosclerotic narrowing or dissection. No vessel occlusion. No aneurysm or vascular malformation.   Electronically Signed   By: Paulina Fusi M.D.   On: 08/09/2013 19:01   Dg Chest 2 View  08/09/2013   CLINICAL DATA:  Stroke, hypertension, asthma.  EXAM: CHEST  2 VIEW  COMPARISON:  04/15/2012  FINDINGS: The heart size and mediastinal contours are within normal limits. Both lungs are clear. The visualized skeletal structures are unremarkable.  IMPRESSION: No active cardiopulmonary disease.   Electronically Signed   By: Charlett Nose M.D.   On: 08/09/2013 19:00   Ct Head Wo Contrast  08/09/2013   CLINICAL DATA:  New expressive aphasia, hx of CVA in the past  EXAM: CT HEAD WITHOUT CONTRAST  TECHNIQUE:  Contiguous axial images were obtained from the base of the skull through the vertex without intravenous contrast.  COMPARISON:  Head CT 04/20/2008  FINDINGS: Stable area of chronic left temporoparietal infarction. No acute intracranial abnormality. Specifically, no hemorrhage, hydrocephalus, mass lesion, acute infarction, or significant intracranial injury. No acute calvarial abnormality. Visualized paranasal sinuses and mastoid air cells are patent.  IMPRESSION: Stable chronic left MCA distribution infarction. No acute intracranial abnormality.   Electronically Signed   By: Salome Holmes M.D.   On: 08/09/2013 17:05   Ct Angio Neck W/cm &/or Wo/cm  08/09/2013   CLINICAL DATA:  exprssive aphasia  EXAM: CT ANGIOGRAPHY HEAD AND NECK  TECHNIQUE: Multidetector CT imaging of the head and neck was performed using the standard protocol during bolus administration of intravenous contrast. Multiplanar CT image reconstructions and MIPs were obtained to evaluate the vascular anatomy. Carotid stenosis measurements (when applicable) are obtained utilizing NASCET criteria, using the distal internal carotid diameter as the denominator.  CONTRAST:  OMNIPAQUE IOHEXOL 350 MG/ML SOLN  COMPARISON:  Head CT same day.  MRI 05/17/2006.  FINDINGS: CTA HEAD FINDINGS  Both internal carotid arteries are widely patent through the siphon regions. No supra clinoid stenosis. The anterior and middle cerebral vessels appear normal without proximal stenosis, aneurysm or vascular malformation. Both vertebral arteries are patent to the basilar with the left being a very small vessel. No basilar stenosis. Posterior circulation branch vessels appear patent and normal.  Intracranial venous structures are patent.  Chronic ischemic changes are seen at the temporoparietal junction region on the left.  Review of the MIP images confirms the above findings.  CTA NECK FINDINGS  Branching pattern of the brachiocephalic vessels from the arch is normal. No  origin stenoses. Lung apices are clear.  Both common carotid arteries are widely patent to their respective bifurcation. Both carotid bifurcations appear normal without evidence of stenosis or irregularity. Both cervical internal carotid arteries are normal.  Both vertebral artery origins are widely patent. The right vertebral artery is larger than the left. Both vessels are patent through the cervical region.  Review of the MIP images confirms the above findings.  IMPRESSION: Negative study. No evidence of atherosclerotic narrowing or dissection. No vessel occlusion. No aneurysm or vascular malformation.   Electronically Signed   By: Paulina Fusi M.D.   On: 08/09/2013 19:01   Mr Brain Wo Contrast  08/10/2013   CLINICAL DATA:  Stroke  EXAM: MRI HEAD WITHOUT CONTRAST  MRA HEAD WITHOUT CONTRAST  TECHNIQUE: Multiplanar, multiecho pulse sequences of the brain and surrounding structures were obtained without intravenous contrast. Angiographic images of the head were obtained using MRA technique without contrast.  COMPARISON:  CT 08/09/2013  FINDINGS: MRI HEAD FINDINGS  Chronic infarct left parietal cortex. Chronic microvascular ischemic changes are present in the white matter bilaterally.  1 cm area of acute infarct in the left parietal operculum. Additional area of acute infarct in the left insula. Small area of acute infarct in the left lower parietal lobe inferior to the chronic cortical infarct.  Negative for hemorrhage or mass. Ventricle size is normal. No shift of the midline structures.  MRA HEAD FINDINGS  Right vertebral artery is widely patent and is normal. Hypoplastic left vertebral artery is small and irregular but does contribute to the basilar. Basilar widely patent. Superior cerebellar and posterior cerebral arteries are widely patent. Posterior communicating artery is patent bilaterally.  Anterior and middle cerebral arteries are widely patent bilaterally without stenosis.  Negative for cerebral  aneurysm.  IMPRESSION: Chronic left parietal infarct. Chronic microvascular ischemic change. Extension of acute infarct into the left parietal operculum, left insula, and left inferior parietal lobe, surrounding the area of chronic infarction.  Small irregular distal left vertebral artery. 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   Electronically Signed   By: Marlan Palau M.D.   On: 08/10/2013 09:14   Mr Maxine Glenn Head/brain Wo Cm  08/10/2013   CLINICAL DATA:  Stroke  EXAM: MRI HEAD WITHOUT CONTRAST  MRA HEAD WITHOUT CONTRAST  TECHNIQUE: Multiplanar, multiecho pulse sequences of the brain and surrounding structures were obtained without intravenous contrast. Angiographic images of the head were obtained using MRA technique without contrast.  COMPARISON:  CT 08/09/2013  FINDINGS: MRI HEAD FINDINGS  Chronic infarct left parietal cortex. Chronic microvascular ischemic changes are present in the white matter bilaterally.  1 cm area of acute infarct in the left parietal operculum. Additional area of acute infarct in the left insula. Small area of acute infarct in the left lower parietal lobe inferior to the chronic cortical infarct.  Negative for hemorrhage or mass. Ventricle size is normal. No shift of the midline structures.  MRA HEAD FINDINGS  Right vertebral artery is widely patent and is normal. Hypoplastic left vertebral artery is small and irregular but does contribute to the basilar. Basilar widely patent. Superior cerebellar and posterior cerebral arteries are widely patent. Posterior communicating artery is patent bilaterally.  Anterior and middle cerebral arteries are widely patent bilaterally without stenosis.  Negative for cerebral aneurysm.  IMPRESSION: Chronic left parietal infarct. Chronic microvascular ischemic change. Extension of acute infarct into the left parietal operculum, left insula, and left inferior parietal lobe,  surrounding the area of chronic infarction.  Small irregular distal left vertebral artery. 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   Electronically Signed   By: Marlan Palau M.D.   On: 08/10/2013 09:14    Assessment/Plan: Diagnosis: left brain infarcts with word finding deficits, weakness 1. Does the need for close, 24 hr/day medical supervision in concert with the patient's rehab needs make it unreasonable for this patient to be served in a less intensive setting? Yes 2. Co-Morbidities requiring supervision/potential complications: htn, post-stroke sequelae, pain 3. Due to bladder management, bowel management, safety, skin/wound care, disease management, medication administration, pain management and patient education, does the patient require 24 hr/day rehab nursing? Yes 4. Does the patient require coordinated care of a physician, rehab nurse, PT (1-2 hrs/day, 5 days/week), OT (1-2 hrs/day, 5 days/week) and SLP (1-2 hrs/day, 5 days/week) to address physical and  functional deficits in the context of the above medical diagnosis(es)? Yes Addressing deficits in the following areas: balance, endurance, locomotion, strength, transferring, bowel/bladder control, bathing, dressing, feeding, grooming, toileting, cognition, speech, language, swallowing and psychosocial support 5. Can the patient actively participate in an intensive therapy program of at least 3 hrs of therapy per day at least 5 days per week? Yes 6. The potential for patient to make measurable gains while on inpatient rehab is excellent 7. Anticipated functional outcomes upon discharge from inpatient rehab are modified independent and supervision  with PT, modified independent and supervision with OT, supervision with SLP. 8. Estimated rehab length of stay to reach the above functional goals is: 7-10 days 9. Does the patient have adequate social supports to  accommodate these discharge functional goals? Potentially 10. Anticipated D/C setting: Home 11. Anticipated post D/C treatments: HH therapy 12. Overall Rehab/Functional Prognosis: excellent  RECOMMENDATIONS: This patient's condition is appropriate for continued rehabilitative care in the following setting: CIR Patient has agreed to participate in recommended program. Yes Note that insurance prior authorization may be required for reimbursement for recommended care.  Comment: Rehab Admissions Coordinator to follow up. Needs to be Mod I during the day (beyond CNA assist in the AM) to go home.   Thanks,  Ranelle Oyster, MD, [place] [facility]     08/11/2013

## 2013-08-11 NOTE — Progress Notes (Signed)
Occupational Therapy Treatment Patient Details Name: Kara HammockSheila L Hegna MRN: [id number] DOB: [birth year] Today's Date: 08/11/2013    History of present illness Admitted with slurred speech. PMHx left temporal parietal infarction back in 2008 at which time she presented with expressive aphasia, has history of hypertension, dyslipidemia, tobacco abuse as well. Pt with very painfull LUE (if she even thinks you are going to touch it she winces), says she [facility] [place] that she has carpal tunnel and a blood clot (stroke PA in room and is going tofollow up on this). MRI: Chronic left parietal infarct. Chronic microvascular ischemic change. Extension of acute infarct into the left parietal operculum, left insula, and left inferior parietal lobe, surrounding the area of chronic infarction.   OT comments  Focus of today's session was on toilet transfer with hand hygiene and stress loading program education. Pt will have intermittent supervision at home and would benefit from CIR to facilitate D/C home at a modified independent level. Will continue to follow while in acute care.  Follow Up Recommendations  CIR    Equipment Recommendations  3 in 1 bedside comode       Precautions / Restrictions Precautions Precautions: Fall Precaution Comments: LUE pain Restrictions Weight Bearing Restrictions: No       Mobility Bed Mobility Overal bed mobility: Needs Assistance Bed Mobility: Sit to Supine     Sit to supine: Supervision    Transfers Overall transfer level: Needs assistance Equipment used: Straight cane Transfers: Sit to/from Stand Sit to Stand: Min guard         General transfer comment: slow and guarded, but guard for safety only.    Balance Overall balance assessment: Needs assistance Sitting-balance support: No upper extremity supported;Feet supported Sitting balance-Leahy Scale: Fair     Standing balance support: Single extremity supported;During functional  activity Standing balance-Leahy Scale: Poor                     ADL Overall ADL's : Needs assistance/impaired     Grooming: Sitting;Wash/dry hands;Min guard                   Toilet Transfer: Ambulation;Min guard   Toileting- Clothing Manipulation and Hygiene: Sit to/from stand;Min guard       Functional mobility during ADLs: Cane;Min guard General ADL Comments: Pt able to ambulate to/from the bathroom with min guard A for safety. Pt used LUE to push up from toilet using the grab bars and stood at the sink for hand hygiene. Educated pt on stress loading program for CRPS and had her put weight through LUE pushing onto bath towel and apply as much pressure as possible using a back and forth motion for 3 minutes; then had pt hold a lotion bottle with L hand with elbow extended for 3 minutes. Instructed pt to repeat this process one more time today.            Vision Decreased Rt lower quadrant visual field.   Cognition   Behavior During Therapy: WFL for tasks assessed/performed Overall Cognitive Status: Within Functional Limits for tasks assessed                                    Pertinent Vitals/ Pain       Pt c/o pain during exercises but did not rate. Pt was repositioned in bed at the end of  tx.         Frequency Min 3X/week     Progress Toward Goals  OT Goals(current goals can now be found in the care plan section)  Progress towards OT goals: Progressing toward goals  Acute Rehab OT Goals Patient Stated Goal: be able to live by myself OT Goal Formulation: With patient Time For Goal Achievement: 08/17/13 Potential to Achieve Goals: Good  Plan Discharge plan remains appropriate       End of Session Equipment Utilized During Treatment:  (cane)   Activity Tolerance Patient tolerated treatment well   Patient Left in bed;with family/visitor present;with call bell/phone within reach   Nurse Communication  (stress loading program)         Time:  - 1520-1544 24 min    Charges:    Maurene CapesKeene, Whitney 08/11/2013, 4:06 PM   [facility] Kareem Cathey, OTR/L  [phone] 08/12/2013

## 2013-08-11 NOTE — Progress Notes (Addendum)
[facility] PROGRESS NOTE  Kara Mendoza YQM:[id number] DOB: [birth year] DOA: 08/09/2013 PCP: Dorrene German, MD Brief Narrative: Kara Mendoza is a 63 y.o. female with a history of a left temporal parietal infarction back in 2008 at which time she presented with expressive aphasia, has history of hypertension, dyslipidemia, tobacco abuse as well, presented to the ER with complaints of dysarthria and worsening LUE numbness. Stroke workup underway, slight acute changes on MRI in L pareital lobe, NEuro following, continued on Plavix/statin therapy. Plan for TEE tomorrow and CIR possibly for rehab  Assessment/Plan: 1. Chronic L Parietal infarct with extension of Acute infarct in L parietal lobe - continue plavix/statin - Neuro following, per Dr.Xu MRI doesn't quite explain stuttering, stress could be contributing - 2D ECHO EF 55% no cardiac source of thrombus - Carotid duplex: normal CTA  - Pt/OT/St following CIR recommended -Neuro Recommending TEE and Loop recorder, d/w Cardiology, plan for tomorrow am -LDL 128, continue statin, hbaic 5.8  2. Hypertension.  - resume Losartan at low dose, hold HCTZ  3. Dyslipidemia.  -FU lipid panel, continue statin   4. Type 2 diabetes mellitus -hold metformin, SSI -hbaic 5.8  5. Hyperthyroidism.  -Will continue Methimazole, TSH normal  6. Suspected RSD LUE: -PT, percocet PRN  7. Anxiety with home/rent etc -children trying to sort this issue   DVT prophylaxis. Lovenox  Code Status: Full Code Family Communication: none at bedside Disposition Plan: home pending workup   Consultants:  Neuro  HPI/Subjective: Still with stuttering, stressed about her Rent/bills etc, LUE pain  Objective: Filed Vitals:   08/11/13 0946  BP: 133/58  Pulse: 68  Temp: 97.8 F (36.6 C)  Resp: 18    Intake/Output Summary (Last 24 hours) at 08/11/13 1059 Last data filed at 08/11/13 0700  Gross per 24 hour  Intake    300 ml  Output      0 ml   Net    300 ml   Filed Weights   08/09/13 2030  Weight: 78.7 kg (173 lb 8 oz)    Exam:   General:  AAOx3, no distress, anxious tearful  Cardiovascular: S1S2/RRR  Respiratory: CTAB  Abdomen: soft, NT, BS present  Musculoskeletal: no edema c/c  Neuro: dysarthric, motor 5/5, sensory light touch intact, DTR 2plus, plantars mute   Data Reviewed: Basic Metabolic Panel:  Recent Labs Lab 08/09/13 1630 08/10/13 0848 08/11/13 0420  NA 140 141 142  K 4.1 3.5* 4.0  CL 101 101 103  CO2 26 26 26   GLUCOSE 109* 142* 111*  BUN 14 11 11   CREATININE 0.80 0.72 0.80  CALCIUM 10.2 9.8 9.8   Liver Function Tests:  Recent Labs Lab 08/09/13 1630 08/11/13 0420  AST 14 14  ALT 16 15  ALKPHOS 69 59  BILITOT 0.6 0.6  PROT 7.5 6.6  ALBUMIN 4.1 3.7   No results found for this basename: LIPASE, AMYLASE,  in the last 168 hours No results found for this basename: AMMONIA,  in the last 168 hours CBC:  Recent Labs Lab 08/09/13 1630 08/10/13 0848 08/11/13 0420  WBC 9.6 6.7 7.7  HGB 14.9 14.8 13.7  HCT 43.3 42.6 41.3  MCV 86.4 86.6 88.4  PLT 343 334 321   Cardiac Enzymes:  Recent Labs Lab 08/09/13 1630  TROPONINI <0.30   BNP (last 3 results) No results found for this basename: PROBNP,  in the last 8760 hours CBG:  Recent Labs Lab 08/10/13 0811 08/10/13 1157 08/10/13 1641 08/10/13 2133 08/11/13 5284  GLUCAP 106* 105* 93 161* 109*    Recent Results (from the past 240 hour(s))  MRSA PCR SCREENING     Status: None   Collection Time    08/10/13  1:36 AM      Result Value Ref Range Status   MRSA by PCR NEGATIVE  NEGATIVE Final   Comment:            The GeneXpert MRSA Assay (FDA     approved for NASAL specimens     only), is one component of a     comprehensive MRSA colonization     surveillance program. It is not     intended to diagnose MRSA     infection nor to guide or     monitor treatment for     MRSA infections.     Studies: Ct Angio Head W/cm  &/or Wo Cm  08/09/2013   CLINICAL DATA:  exprssive aphasia  EXAM: CT ANGIOGRAPHY HEAD AND NECK  TECHNIQUE: Multidetector CT imaging of the head and neck was performed using the standard protocol during bolus administration of intravenous contrast. Multiplanar CT image reconstructions and MIPs were obtained to evaluate the vascular anatomy. Carotid stenosis measurements (when applicable) are obtained utilizing NASCET criteria, using the distal internal carotid diameter as the denominator.  CONTRAST:  OMNIPAQUE IOHEXOL 350 MG/ML SOLN  COMPARISON:  Head CT same day.  MRI 05/17/2006.  FINDINGS: CTA HEAD FINDINGS  Both internal carotid arteries are widely patent through the siphon regions. No supra clinoid stenosis. The anterior and middle cerebral vessels appear normal without proximal stenosis, aneurysm or vascular malformation. Both vertebral arteries are patent to the basilar with the left being a very small vessel. No basilar stenosis. Posterior circulation branch vessels appear patent and normal.  Intracranial venous structures are patent.  Chronic ischemic changes are seen at the temporoparietal junction region on the left.  Review of the MIP images confirms the above findings.  CTA NECK FINDINGS  Branching pattern of the brachiocephalic vessels from the arch is normal. No origin stenoses. Lung apices are clear.  Both common carotid arteries are widely patent to their respective bifurcation. Both carotid bifurcations appear normal without evidence of stenosis or irregularity. Both cervical internal carotid arteries are normal.  Both vertebral artery origins are widely patent. The right vertebral artery is larger than the left. Both vessels are patent through the cervical region.  Review of the MIP images confirms the above findings.  IMPRESSION: Negative study. No evidence of atherosclerotic narrowing or dissection. No vessel occlusion. No aneurysm or vascular malformation.   Electronically Signed   By: Paulina Fusi M.D.   On: 08/09/2013 19:01   Dg Chest 2 View  08/09/2013   CLINICAL DATA:  Stroke, hypertension, asthma.  EXAM: CHEST  2 VIEW  COMPARISON:  04/15/2012  FINDINGS: The heart size and mediastinal contours are within normal limits. Both lungs are clear. The visualized skeletal structures are unremarkable.  IMPRESSION: No active cardiopulmonary disease.   Electronically Signed   By: Charlett Nose M.D.   On: 08/09/2013 19:00   Ct Head Wo Contrast  08/09/2013   CLINICAL DATA:  New expressive aphasia, hx of CVA in the past  EXAM: CT HEAD WITHOUT CONTRAST  TECHNIQUE: Contiguous axial images were obtained from the base of the skull through the vertex without intravenous contrast.  COMPARISON:  Head CT 04/20/2008  FINDINGS: Stable area of chronic left temporoparietal infarction. No acute intracranial abnormality. Specifically, no hemorrhage, hydrocephalus, mass lesion,  acute infarction, or significant intracranial injury. No acute calvarial abnormality. Visualized paranasal sinuses and mastoid air cells are patent.  IMPRESSION: Stable chronic left MCA distribution infarction. No acute intracranial abnormality.   Electronically Signed   By: Salome HolmesHector  Cooper M.D.   On: 08/09/2013 17:05   Ct Angio Neck W/cm &/or Wo/cm  08/09/2013   CLINICAL DATA:  exprssive aphasia  EXAM: CT ANGIOGRAPHY HEAD AND NECK  TECHNIQUE: Multidetector CT imaging of the head and neck was performed using the standard protocol during bolus administration of intravenous contrast. Multiplanar CT image reconstructions and MIPs were obtained to evaluate the vascular anatomy. Carotid stenosis measurements (when applicable) are obtained utilizing NASCET criteria, using the distal internal carotid diameter as the denominator.  CONTRAST:  100mL OMNIPAQUE IOHEXOL 350 MG/ML SOLN  COMPARISON:  Head CT same day.  MRI 05/17/2006.  FINDINGS: CTA HEAD FINDINGS  Both internal carotid arteries are widely patent through the siphon regions. No supra clinoid  stenosis. The anterior and middle cerebral vessels appear normal without proximal stenosis, aneurysm or vascular malformation. Both vertebral arteries are patent to the basilar with the left being a very small vessel. No basilar stenosis. Posterior circulation branch vessels appear patent and normal.  Intracranial venous structures are patent.  Chronic ischemic changes are seen at the temporoparietal junction region on the left.  Review of the MIP images confirms the above findings.  CTA NECK FINDINGS  Branching pattern of the brachiocephalic vessels from the arch is normal. No origin stenoses. Lung apices are clear.  Both common carotid arteries are widely patent to their respective bifurcation. Both carotid bifurcations appear normal without evidence of stenosis or irregularity. Both cervical internal carotid arteries are normal.  Both vertebral artery origins are widely patent. The right vertebral artery is larger than the left. Both vessels are patent through the cervical region.  Review of the MIP images confirms the above findings.  IMPRESSION: Negative study. No evidence of atherosclerotic narrowing or dissection. No vessel occlusion. No aneurysm or vascular malformation.   Electronically Signed   By: Paulina FusiMark  Shogry M.D.   On: 08/09/2013 19:01   Mr Brain Wo Contrast  08/10/2013   CLINICAL DATA:  Stroke  EXAM: MRI HEAD WITHOUT CONTRAST  MRA HEAD WITHOUT CONTRAST  TECHNIQUE: Multiplanar, multiecho pulse sequences of the brain and surrounding structures were obtained without intravenous contrast. Angiographic images of the head were obtained using MRA technique without contrast.  COMPARISON:  CT 08/09/2013  FINDINGS: MRI HEAD FINDINGS  Chronic infarct left parietal cortex. Chronic microvascular ischemic changes are present in the white matter bilaterally.  1 cm area of acute infarct in the left parietal operculum. Additional area of acute infarct in the left insula. Small area of acute infarct in the left lower  parietal lobe inferior to the chronic cortical infarct.  Negative for hemorrhage or mass. Ventricle size is normal. No shift of the midline structures.  MRA HEAD FINDINGS  Right vertebral artery is widely patent and is normal. Hypoplastic left vertebral artery is small and irregular but does contribute to the basilar. Basilar widely patent. Superior cerebellar and posterior cerebral arteries are widely patent. Posterior communicating artery is patent bilaterally.  Anterior and middle cerebral arteries are widely patent bilaterally without stenosis.  Negative for cerebral aneurysm.  IMPRESSION: Chronic left parietal infarct. Chronic microvascular ischemic change. Extension of acute infarct into the left parietal operculum, left insula, and left inferior parietal lobe, surrounding the area of chronic infarction.  Small irregular distal left vertebral artery. 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   Electronically Signed   By: Marlan Palauharles  Clark M.D.   On: 08/10/2013 09:14   Mr Maxine GlennMra Head/brain Wo Cm  08/10/2013   CLINICAL DATA:  Stroke  EXAM: MRI HEAD WITHOUT CONTRAST  MRA HEAD WITHOUT CONTRAST  TECHNIQUE: Multiplanar, multiecho pulse sequences of the brain and surrounding structures were obtained without intravenous contrast. Angiographic images of the head were obtained using MRA technique without contrast.  COMPARISON:  CT 08/09/2013  FINDINGS: MRI HEAD FINDINGS  Chronic infarct left parietal cortex. Chronic microvascular ischemic changes are present in the white matter bilaterally.  1 cm area of acute infarct in the left parietal operculum. Additional area of acute infarct in the left insula. Small area of acute infarct in the left lower parietal lobe inferior to the chronic cortical infarct.  Negative for hemorrhage or mass. Ventricle size is normal. No shift of the midline structures.  MRA HEAD FINDINGS  Right vertebral artery is widely  patent and is normal. Hypoplastic left vertebral artery is small and irregular but does contribute to the basilar. Basilar widely patent. Superior cerebellar and posterior cerebral arteries are widely patent. Posterior communicating artery is patent bilaterally.  Anterior and middle cerebral arteries are widely patent bilaterally without stenosis.  Negative for cerebral aneurysm.  IMPRESSION: Chronic left parietal infarct. Chronic microvascular ischemic change. Extension of acute infarct into the left parietal operculum, left insula, and left inferior parietal lobe, surrounding the area of chronic infarction.  Small irregular distal left vertebral artery. 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   Electronically Signed   By: Marlan Palauharles  Clark M.D.   On: 08/10/2013 09:14    Scheduled Meds: . clopidogrel  75 mg Oral Daily  . enoxaparin (LOVENOX) injection  40 mg Subcutaneous Q24H  . insulin aspart  0-15 Units Subcutaneous TID WC  . insulin aspart  0-5 Units Subcutaneous QHS  . losartan  50 mg Oral Daily  . methimazole  5 mg Oral Daily  . pregabalin  50 mg Oral TID  . simvastatin  40 mg Oral QHS   Continuous Infusions:  Antibiotics Given (last 72 hours)   None      Principal Problem:   CVA (cerebral infarction) Active Problems:   GERD (gastroesophageal reflux disease)   Hyperlipidemia   Hypertension   Dysarthria    Time spent: 30min    Marias Medical CenterJOSEPH,Alina Gilkey  [facility] Pager [phone]. If 7PM-7AM, please contact night-coverage at [email or web address], password [email or web address] 08/11/2013, 10:59 AM  LOS: 2 days

## 2013-08-11 NOTE — Consult Note (Signed)
ELECTROPHYSIOLOGY CONSULT NOTE  Patient ID: Kara Mendoza MRN: [id number], DOB/AGE: [birth year]   Admit date: 08/09/2013 Date of Consult: 08/11/2013  Primary Physician: Dorrene GermanAVBUERE,EDWIN A, MD Primary Cardiologist: [facility] Reason for Consultation: Cryptogenic stroke; recommendations regarding Implantable Loop Recorder  History of Present Illness Kara Mendoza was admitted on 08/09/2013 with expressive aphasia.  Imaging demonstrated chronic left parietal infarct with new extension.  Her previous CVA was in 2008.  She has undergone workup for stroke including echocardiogram and carotid dopplers.  The patient has been monitored on telemetry which has demonstrated sinus rhythm with no arrhythmias.  Inpatient stroke work-up is to be completed with a TEE.   Echocardiogram this admission demonstrated EF 55-60%, no RWMA, LA 26.  Lab work is reviewed.  Prior to admission, the patient denies chest pain, shortness of breath, dizziness, palpitations, or syncope.  She is  recovering from her stroke with plans to go to CIR at discharge. 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  ROS is negative except as outlined above.    Past Medical History  Diagnosis Date  . Stroke   . Neuromuscular disorder   . Hypertension   . Hyperlipidemia   . Asthma   . Diabetes mellitus   . Hyperthyroidism   . Esophageal reflux   . Constipation   . Major depressive disorder, recurrent episode, moderate   . Anxiety state, unspecified   . History of fall   . Abnormality of gait   . Chronic pain syndrome   . Tobacco use disorder   . Osteoarthritis   . Dysarthria   . Acute bronchitis   .  Chronic arm pain 2008    left  . Chronic leg pain 2008    left  . Fatigue   . Chronic headache      Surgical History:  Past Surgical History  Procedure Laterality Date  . Cesarean section      x3  . Finger surgery       Prescriptions prior to admission  Medication Sig Dispense Refill  . albuterol (PROVENTIL HFA;VENTOLIN HFA) 108 (90 BASE) MCG/ACT inhaler Inhale 3 puffs into the lungs every 6 (six) hours as needed for wheezing or shortness of breath.       . buprenorphine (BUTRANS) 10 MCG/HR PTWK patch Place 10 mcg onto the skin once a week.      . meloxicam (MOBIC) 7.5 MG tablet Take 7.5 mg by mouth daily.      . metFORMIN (GLUCOPHAGE) 500 MG tablet Take 500 mg by mouth daily.      Marland Kitchen. oxyCODONE-acetaminophen (PERCOCET) 10-325 MG per tablet Take 1 tablet by mouth every 6 (six) hours as needed. For pain      . oxyCODONE-acetaminophen (PERCOCET/ROXICET) 5-325 MG per tablet Take 1 tablet by mouth every 6 (six) hours as needed for severe pain.      Marland Kitchen. sertraline (ZOLOFT) 50 MG tablet Take 1 tablet by mouth at bedtime.      . Tapentadol HCl (NUCYNTA) 100 MG TABS Take 50 mg by mouth 2 (two) times daily.      . clopidogrel (PLAVIX) 75 MG tablet Take 1 tablet  by mouth daily.      . diclofenac (VOLTAREN) 75 MG EC tablet Take 75 mg by mouth 2 (two) times daily.      . diclofenac sodium (VOLTAREN) 1 % GEL Apply 4 g topically 4 (four) times daily as needed.      . hydrochlorothiazide (HYDRODIURIL) 50 MG tablet Take 50 mg by mouth daily.      Marland Kitchen. losartan (COZAAR) 100 MG tablet Take 1 tablet by mouth daily.      Marland Kitchen. losartan-hydrochlorothiazide (HYZAAR) 100-25 MG per tablet Take 1 tablet by mouth daily.      . methimazole (TAPAZOLE) 5 MG tablet Take 5 mg by mouth daily.      . Multiple Vitamins-Calcium (ONE-A-DAY WOMENS FORMULA PO) Take 1 tablet by mouth daily.      Marland Kitchen. omeprazole (PRILOSEC) 20 MG capsule Take 20 mg by mouth 2 (two) times daily.      . pregabalin (LYRICA) 50 MG capsule Take 50 mg by  mouth 3 (three) times daily.      . sennosides-docusate sodium (SENOKOT-S) 8.6-50 MG tablet Take 1 tablet by mouth daily.      . simvastatin (ZOCOR) 20 MG tablet Take 10 mg by mouth at bedtime. 1/2 tab qd      . zolpidem (AMBIEN) 5 MG tablet Take 5 mg by mouth at bedtime.        Inpatient Medications:  . clopidogrel  75 mg Oral Daily  . enoxaparin (LOVENOX) injection  40 mg Subcutaneous Q24H  . insulin aspart  0-15 Units Subcutaneous TID WC  . insulin aspart  0-5 Units Subcutaneous QHS  . losartan  50 mg Oral Daily  . methimazole  5 mg Oral Daily  . pregabalin  50 mg Oral TID  . simvastatin  40 mg Oral QHS    Allergies:  Allergies  Allergen Reactions  . Aspirin Other (See Comments)    Possibly itching and "feeling little weird"  . Penicillins Hives    History   Social History  . Marital Status: Single    Spouse Name: N/A    Number of Children: 3  . Years of Education: College   Occupational History  .  Other    Disabled   Social History Main Topics  . Smoking status: Current Every Day Smoker -- 0.20 packs/day for 15 years    Types: Cigarettes  . Smokeless tobacco: Never Used     Comment: Tryin to quit  . Alcohol Use: No  . Drug Use: No  . Sexual Activity: Not on file   Other Topics Concern  . Not on file   Social History Narrative   Patient lives at home with her son.   Caffeine Use: 1 cup every other day     Family History  Problem Relation Age of Onset  . Diabetes Sister   . Cancer Father   . Diabetes Brother     BP 133/58  Pulse 68  Temp(Src) 97.8 F (36.6 C) (Axillary)  Resp 18  Ht 5\' 2"  (1.575 m)  Wt 173 lb 8 oz (78.7 kg)  BMI 31.73 kg/m2  SpO2 96%  Physical Exam: Well developed and nourished in no acute distress HENT normal Neck supple with JVP-flat Cartoids neg for bruit Clear Without chest wall or abd tenderness Regular rate and rhythm, 2/6 murmur Back without kyphosis Abd-soft with active BS No Clubbing cyanosis edema Skin-warm  and dry A & Oriented  Aphasic speech and not moving l side   Labs:  Lab Results  Component Value Date   WBC 7.7 08/11/2013   HGB 13.7 08/11/2013   HCT 41.3 08/11/2013   MCV 88.4 08/11/2013   PLT 321 08/11/2013    Recent Labs Lab 08/11/13 0420  NA 142  K 4.0  CL 103  CO2 26  BUN 11  CREATININE 0.80  CALCIUM 9.8  PROT 6.6  BILITOT 0.6  ALKPHOS 59  ALT 15  AST 14  GLUCOSE 111*    Radiology/Studies: Ct Angio Head W/cm &/or Wo Cm 08/09/2013   CLINICAL DATA:  exprssive aphasia  EXAM: CT ANGIOGRAPHY HEAD AND NECK  TECHNIQUE: Multidetector CT imaging of the head and neck was performed using the standard protocol during bolus administration of intravenous contrast. Multiplanar CT image reconstructions and MIPs were obtained to evaluate the vascular anatomy. Carotid stenosis measurements (when applicable) are obtained utilizing NASCET criteria, using the distal internal carotid diameter as the denominator.  CONTRAST:  OMNIPAQUE IOHEXOL 350 MG/ML SOLN  COMPARISON:  Head CT same day.  MRI 05/17/2006.  FINDINGS: CTA HEAD FINDINGS  Both internal carotid arteries are widely patent through the siphon regions. No supra clinoid stenosis. The anterior and middle cerebral vessels appear normal without proximal stenosis, aneurysm or vascular malformation. Both vertebral arteries are patent to the basilar with the left being a very small vessel. No basilar stenosis. Posterior circulation branch vessels appear patent and normal.  Intracranial venous structures are patent.  Chronic ischemic changes are seen at the temporoparietal junction region on the left.  Review of the MIP images confirms the above findings.  CTA NECK FINDINGS  Branching pattern of the brachiocephalic vessels from the arch is normal. No origin stenoses. Lung apices are clear.  Both common carotid arteries are widely patent to their respective bifurcation. Both carotid bifurcations appear normal without evidence of stenosis or irregularity.  Both cervical internal carotid arteries are normal.  Both vertebral artery origins are widely patent. The right vertebral artery is larger than the left. Both vessels are patent through the cervical region.  Review of the MIP images confirms the above findings.  IMPRESSION: Negative study. No evidence of atherosclerotic narrowing or dissection. No vessel occlusion. No aneurysm or vascular malformation.   Electronically Signed   By: Paulina Fusi M.D.   On: 08/09/2013 19:01   Dg Chest 2 View 08/09/2013   CLINICAL DATA:  Stroke, hypertension, asthma.  EXAM: CHEST  2 VIEW  COMPARISON:  04/15/2012  FINDINGS: The heart size and mediastinal contours are within normal limits. Both lungs are clear. The visualized skeletal structures are unremarkable.  IMPRESSION: No active cardiopulmonary disease.   Electronically Signed   By: Charlett Nose M.D.   On: 08/09/2013 19:00   Mr Brain Wo Contrast 08/10/2013   CLINICAL DATA:  Stroke  EXAM: MRI HEAD WITHOUT CONTRAST  MRA HEAD WITHOUT CONTRAST  TECHNIQUE: Multiplanar, multiecho pulse sequences of the brain and surrounding structures were obtained without intravenous contrast. Angiographic images of the head were obtained using MRA technique without contrast.  COMPARISON:  CT 08/09/2013  FINDINGS: MRI HEAD FINDINGS  Chronic infarct left parietal cortex. Chronic microvascular ischemic changes are present in the white matter bilaterally.  1 cm area of acute infarct in the left parietal operculum. Additional area of acute infarct in the left insula. Small area of acute infarct in the left lower parietal lobe inferior to the chronic cortical infarct.  Negative for hemorrhage or mass. Ventricle size is normal. No shift of the midline structures.  MRA HEAD  FINDINGS  Right vertebral artery is widely patent and is normal. Hypoplastic left vertebral artery is small and irregular but does contribute to the basilar. Basilar widely patent. Superior cerebellar and posterior cerebral arteries are  widely patent. Posterior communicating artery is patent bilaterally.  Anterior and middle cerebral arteries are widely patent bilaterally without stenosis.  Negative for cerebral aneurysm.  IMPRESSION: Chronic left parietal infarct. Chronic microvascular ischemic change. Extension of acute infarct into the left parietal operculum, left insula, and left inferior parietal lobe, surrounding the area of chronic infarction.  Small irregular distal left vertebral artery. 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   Electronically Signed   By: Marlan Palau M.D.   On: 08/10/2013 09:14   12-lead ECG sinus rhythm, rate 73, normal intervals  Telemetry sinus rhythm with no atrial arrhythmias    Assessment and Plan  REcurrent Stroke  Palpitations  Chest pain  Cigarette abuse  We will get 12 lead ECG and serial troponin  She prob need myoview scanning for risk stratification  Will schedule for tomorrow  Pt with recurrent ischemic stroke and palpitations certainly raises the question of underlying atrial fibrillation esp in this elderly lady with HTN  Agree with loop recorder insertion and have reviewed its role with the patient  And her sister  Discussed the importance of stopping smoking

## 2013-08-11 NOTE — PMR Pre-admission (Signed)
PMR Admission Coordinator Pre-Admission Assessment  Patient: Kara Mendoza is an 63 y.o., female MRN: [id number] DOB: 1950/02/13 Height: 5' 2"  (157.5 cm) Weight: 78.7 kg (173 lb 8 oz)              Insurance Information HMO: Yes    PPO:       PCP:       IPA:       80/20:       OTHER:  Group # [id number] PRIMARY: [employer]      Policy#: [id number]      Subscriber: Karie Kirks CM Name: Rema Fendt      Phone#: [phone]     Fax#: [phone] Pre-Cert#: [id number]  precert X 10 days [birth year] to 08/23/13          Employer: Disabled Benefits:  Phone #: [phone]     Name: Deveron Furlong. Date: 05/06/13     Deduct: $1260      Out of Pocket Max: [phone] (met $34.27)      Life Max: unlimited CIR: 100% after deductible      SNF: $0 days 1-20; $144.50 days 21-100 Outpatient: 80%     Co-Pay: 20% Home Health: 100%      Co-Pay: none DME: 80%     Co-Pay: 20% Providers: in network  SECONDARY: Medicaid of [place]      Policy#: [id number]      Subscriber: Karie Kirks CM Name:        Phone#:       Fax#:   Pre-Cert#:        Employer: Disabled Benefits:  Phone #: [phone]    Name: [employer]. Date: Eligible 08/11/13     Deduct:        Out of Pocket Max:        Life Max:   CIR:        SNF:   Outpatient:       Co-Pay:   Home Health:        Co-Pay:   DME:       Co-Pay:     Emergency Contact Information Contact Information   Name Relation Home Work [place] Son   [phone]   Ekaterini, Capitano   [phone]     Current Medical History  Patient Admitting Diagnosis:Left brain infarcts with word finding deficits, weakness   History of Present Illness: A 63 y.o. 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 Patient independent with a cane and walker prior to admission living alone with a home  health aide 2 hours daily. Admitted 08/09/2013 with increased aphasia. MRI of the brain showed extension of acute infarcts left parietal operculum, left insula and inferior parietal lobes. MRA of the head without occlusion.  CTA of head and neck negative. Echocardiogram with ejection fraction of 60% no wall motion abnormalities. Patient did not receive TPA. Neurology followup currently continues on Plavix for CVA prophylaxis as well as subcutaneous Lovenox for DVT prophylaxis. TEE completed that showed no PFO without thrombus and underwent loop recorder implantation 08/12/2013 per Dr. Caryl Comes. Physical therapy evaluation 08/10/2013 with recommendations of physical medicine rehabilitation consult. Patient to be admitted for comprehensive inpatient rehabilitation program.      Total: 3=NIH  Past Medical History  Past Medical History  Diagnosis Date  . Neuromuscular disorder   . Hypertension   . Hyperlipidemia   .  Asthma   . Diabetes mellitus   . Hyperthyroidism   . Esophageal reflux   . Constipation   . Major depressive disorder, recurrent episode, moderate   . Anxiety state, unspecified   . History of fall   . Abnormality of gait   . Chronic pain syndrome   . Tobacco use disorder   . Osteoarthritis   . Dysarthria   . Acute bronchitis   . Chronic arm pain 2008    left  . Chronic leg pain 2008    left  . Fatigue   . Chronic headache   . Stroke     Family History  family history includes Cancer in her father; Diabetes in her brother and sister.  Prior Rehab/Hospitalizations:  Had CVA in 2008.  No therapies since then.   Current Medications  Current facility-administered medications:clopidogrel (PLAVIX) tablet 75 mg, 75 mg, Oral, Daily, Kelvin Cellar, MD, 75 mg at 08/14/13 0844;  enoxaparin (LOVENOX) injection 40 mg, 40 mg, Subcutaneous, Q24H, Kelvin Cellar, MD, 40 mg at 08/13/13 2026;  insulin aspart (novoLOG) injection 0-15 Units, 0-15 Units, Subcutaneous, TID WC, Kelvin Cellar, MD, 2 Units at 08/13/13 1003 insulin aspart (novoLOG) injection 0-5 Units, 0-5 Units, Subcutaneous, QHS, Kelvin Cellar, MD;  losartan (COZAAR) tablet 50 mg, 50 mg, Oral, Daily, Domenic Polite, MD, 50 mg at 08/14/13 1005;  methimazole (TAPAZOLE) tablet 5 mg, 5 mg, Oral, Daily, Kelvin Cellar, MD, 5 mg at 08/14/13 1005;  oxyCODONE-acetaminophen (PERCOCET/ROXICET) 5-325 MG per tablet 1 tablet, 1 tablet, Oral, Q8H PRN, Dianne Dun, NP, 1 tablet at 08/13/13 1829 pregabalin (LYRICA) capsule 50 mg, 50 mg, Oral, TID, Stormy Fabian, NP, 50 mg at 08/14/13 1005;  senna-docusate (Senokot-S) tablet 1 tablet, 1 tablet, Oral, QHS PRN, Kelvin Cellar, MD;  simvastatin (ZOCOR) tablet 40 mg, 40 mg, Oral, QHS, Rosalin Hawking, MD, 40 mg at 08/13/13 2212;  zolpidem (AMBIEN) tablet 5 mg, 5 mg, Oral, QHS PRN, Dianne Dun, NP, 5 mg at 08/11/13 2139  Patients Current Diet: Carb Control  Precautions / Restrictions Precautions Precautions: Fall Precaution Comments: LUE pain Restrictions Weight Bearing Restrictions: No   Prior Activity Level Limited Community (1-2x/wk): Went out 1-2 X a week to church, to choir practice or out to get something she needed.  Home Assistive Devices / Equipment Home Assistive Devices/Equipment: Cane (specify quad or straight) (cane) Home Equipment: Walker - 2 wheels;Cane - single point  Prior Functional Level Prior Function Level of Independence: Needs assistance Gait / Transfers Assistance Needed: uses cane, but has not previously needed physical support ADL's / Homemaking Assistance Needed: Nurse aide assistance 2 hours a day to help with B/D since about 9 months ago when she started having pain in her LUE (which has continued to get worse), up until then the aid mainly helped her around the house for IADLs  Current Functional Level Cognition  Arousal/Alertness: Awake/alert Overall Cognitive Status: Within Functional Limits for tasks  assessed Orientation Level: Oriented X4 Attention: Selective Selective Attention: Appears intact Memory: Appears intact Awareness: Appears intact Problem Solving: Appears intact Safety/Judgment: Appears intact    Extremity Assessment (includes Sensation/Coordination)  Upper Extremity Assessment: Defer to OT evaluation  LUE Deficits / Details: Can move all joints however slow and laborous due to pain and not full range due to edema and pain--arm is essentially non-functional  Lower Extremity Assessment: Overall WFL for tasks assessed;LLE deficits/detail  LLE Deficits / Details: grossly >4/5     ADLs  Overall ADL's : Needs assistance/impaired Eating/Feeding:  Set up;Sitting Grooming: Wash/dry hands;Min guard;Standing Upper Body Bathing: Minimal assitance;Sitting Lower Body Bathing: Moderate assistance;Sit to/from stand Upper Body Dressing : Moderate assistance;Sitting Lower Body Dressing: Maximal assistance;Sit to/from stand Toilet Transfer: Ambulation;Min guard;Regular Toilet;Grab bars Toileting- Water quality scientist and Hygiene: Sit to/from stand;Min guard Functional mobility during ADLs: Cane;Min guard General ADL Comments: Reviewed stress loading program with pt and had her demonstrate understanding of it by putting weight through LUE and applying as much pressure as possible onto bath towel; then holding onto the sode can (7.4 oz) in L hand with elbow extended. Instructed pt to do both exercises for 4 minutes 4 times a day.     Mobility  Overal bed mobility: Needs Assistance Bed Mobility: Sit to Supine;Supine to Sit Supine to sit: Supervision Sit to supine: Supervision General bed mobility comments: min use of the rail    Transfers  Overall transfer level: Needs assistance Equipment used: Straight cane Transfers: Sit to/from Stand Sit to Stand: Min guard General transfer comment: initial unsteadiness on standing needing guard    Ambulation / Gait / Stairs / Scientist, clinical (histocompatibility and immunogenetics)  Ambulation/Gait Ambulation/Gait assistance: Min guard;Min Wellsite geologist (Feet): 200 Feet Assistive device: Straight cane Gait Pattern/deviations: Step-through pattern Gait velocity: slower Gait velocity interpretation: Below normal speed for age/gender General Gait Details: generally steady today with infrequent min assist for episodes of instability Stairs: Yes Stairs assistance: Min guard Stair Management: One rail Right;Step to pattern;Forwards;With cane Number of Stairs: 3 General stair comments: steady with cane    Posture / Balance Overall balance assessment: Needs assistance  Sitting-balance support: No upper extremity supported;Feet supported  Sitting balance-Leahy Scale: Fair  Standing balance support: Single extremity supported  Standing balance-Leahy Scale: Poor    Special needs/care consideration BiPAP/CPAP No CPM No Continuous Drip IV No Dialysis No     Life Vest No Oxygen No Special Bed No Trach Size No Wound Vac (area) No     Skin Has dry skin areas/callus areas on the palms of her hands                             Bowel mgmt: Had BM 08/10/13 Bladder mgmt: Voiding in bathroom, problems with incontinence at home and used diapers Diabetic mgmt Yes, on oral medications at home.    Previous Home Environment Living Arrangements: Alone Available Help at Discharge: Family;Personal care attendant Type of Home: House Home Layout: One level Home Access: Stairs to enter Entrance Stairs-Rails: Right Entrance Stairs-Number of Steps: 6 Bathroom Shower/Tub: Chiropodist: [place]: No  Discharge Living Setting Plans for Discharge Living Setting: Alone;Apartment Type of Home at Discharge: Apartment Discharge Home Layout: One level (Apt is a second level apartment with 7-8 step entry.) Discharge Home Access: Stairs to enter Entrance Stairs-Number of Steps: 7-8 steps to 2nd level apt. Does the patient have  any problems obtaining your medications?: No  Social/Family/Support Systems Patient Roles: Parent (Has a son and a daughter.) Contact Information: Helana Macbride daughter or Denora Wysocki - son Anticipated Caregiver: self and nurse aide 2 hrs a day Anticipated Caregiver's Contact Information: Remo Lipps - son [phone] or Ailah Barna [phone] Ability/Limitations of Caregiver: Dtr works days and son also works Careers adviser: Intermittent Discharge Plan Discussed with Primary Caregiver: Yes Is Caregiver In Agreement with Plan?: Yes Does Caregiver/Family have Issues with Lodging/Transportation while Pt is in Rehab?: No  Goals/Additional Needs Patient/Family Goal for Rehab: PT/OT Mod I/S, ST  supervision goals Expected length of stay: 7-10 days Cultural Considerations: Baptist, sings in the choir Dietary Needs: Carb mod med cal diabetic diet with thin liquids Equipment Needs: TBD Pt/Family Agrees to Admission and willing to participate: Yes Program Orientation Provided & Reviewed with Pt/Caregiver Including Roles  & Responsibilities: Yes  Decrease burden of Care through IP rehab admission: N/A  Possible need for SNF placement upon discharge: Yes, if she cannot progress to mod I where she can stay alone at home.  Patient hopes to avoid going to a SNF.  Patient Condition: This patient's medical and functional status has changed since the consult dated:08/11/13 in which the Rehabilitation Physician determined and documented that the patient's condition is appropriate for intensive rehabilitative care in an inpatient rehabilitation facility. See "History of Present Illness" (above) for medical update. Functional changes are: Currently requiring min guard/min assist to ambulate 200 ft [place].  Patient's medical and functional status update has been discussed with the Rehabilitation physician and patient remains appropriate for inpatient rehabilitation. Will admit to inpatient rehab  today.  Preadmission Screen Completed By:  Retta Diones, 08/14/2013 11:06 AM ______________________________________________________________________   Discussed status with Dr. Naaman Plummer on 08/14/13 at 1107 and received telephone approval for admission today.  Admission Coordinator:  Retta Diones, time1107/Date07/10/15

## 2013-08-11 NOTE — Progress Notes (Addendum)
Physical Therapy Treatment Patient Details Name: Kara HammockSheila L Mendoza MRN: [id number] DOB: 30-Aug-1950 Today's Date: 08/11/2013    History of Present Illness Admitted with slurred speech. PMHx left temporal parietal infarction back in 2008 at which time she presented with expressive aphasia, has history of hypertension, dyslipidemia, tobacco abuse as well. Pt with very painfull LUE (if she even thinks you are going to touch it she winces), says she was [facility] by [facility] [place] that she has carpal tunnel and a blood clot (stroke PA in room and is going tofollow up on this). MRI: Chronic left parietal infarct. Chronic microvascular ischemic change. Extension of acute infarct into the left parietal operculum, left insula, and left inferior parietal lobe, surrounding the area of chronic infarction.    PT Comments    Improved from last session.  Pt more confident, but still fearful of falling and is guarded unless she is well guarded.  The intensity of inpatient rehab would be beneficial to decrease her risks of falling and allow her to go home with less reliance on PCA services, which she states are unreliable.   Follow Up Recommendations  CIR (if CIR falls through, ? Pt can go to daughter's home Short term)     Equipment Recommendations  None recommended by PT    Recommendations for Other Services       Precautions / Restrictions Precautions Precautions: Fall Precaution Comments: LUE pain Restrictions Weight Bearing Restrictions: No    Mobility  Bed Mobility Overal bed mobility: Needs Assistance       Supine to sit: Min guard     General bed mobility comments: moderate use of the rail  Transfers Overall transfer level: Needs assistance Equipment used: Straight cane Transfers: Sit to/from Stand Sit to Stand: Min guard         General transfer comment: slow and guarded, but guard for safety only.  Ambulation/Gait Ambulation/Gait assistance: Min guard Ambulation Distance  (Feet): 600 Feet Assistive device: Straight cane Gait Pattern/deviations: Step-through pattern Gait velocity: slow speed preferred, but able to noticeably increase speed. Gait velocity interpretation: Below normal speed for age/gender General Gait Details: generally steady with episodes of instability with sudden turns, scanning   Stairs Stairs: Yes Stairs assistance: Min guard Stair Management: Step to pattern;Forwards;With cane Number of Stairs: 6 General stair comments: steady with cane  Wheelchair Mobility    Modified Rankin (Stroke Patients Only) Modified Rankin (Stroke Patients Only) Pre-Morbid Rankin Score: Moderate disability Modified Rankin: Moderately severe disability     Balance                                    Cognition Arousal/Alertness: Awake/alert Behavior During Therapy: WFL for tasks assessed/performed Overall Cognitive Status: Within Functional Limits for tasks assessed                      Exercises      General Comments        Pertinent Vitals/Pain     Home Living                      Prior Function            PT Goals (current goals can now be found in the care plan section) Acute Rehab PT Goals Patient Stated Goal: be able to live by myself PT Goal Formulation: With patient Time For Goal Achievement:  08/24/13 Potential to Achieve Goals: Good Progress towards PT goals: Progressing toward goals    Frequency  Min 3X/week    PT Plan Discharge plan needs to be updated    Co-evaluation             End of Session   Activity Tolerance: Patient tolerated treatment well Patient left: in bed;Other (comment) (with OT)     Time: [id number] PT Time Calculation (min): 39 min  Charges:  $Gait Training: 23-37 mins $Therapeutic Activity: 8-22 mins                    G Codes:      Kara Mendoza, Kara Mendoza 08/11/2013, 3:48 PM 08/11/2013  Kara Mendoza, PT [phone]  (pager)

## 2013-08-12 ENCOUNTER — Encounter (HOSPITAL_COMMUNITY)
Admission: EM | Disposition: A | Discharge status: Rehabilitation Facility | Payer: Self-pay | Source: Home / Self Care | Attending: Internal Medicine

## 2013-08-12 ENCOUNTER — Ambulatory Visit (INDEPENDENT_AMBULATORY_CARE_PROVIDER_SITE_OTHER): Payer: Medicare HMO | Admitting: *Deleted

## 2013-08-12 ENCOUNTER — Encounter (HOSPITAL_COMMUNITY): Payer: Self-pay | Admitting: *Deleted

## 2013-08-12 DIAGNOSIS — I6350 Cerebral infarction due to unspecified occlusion or stenosis of unspecified cerebral artery: Secondary | ICD-10-CM

## 2013-08-12 DIAGNOSIS — R471 Dysarthria and anarthria: Secondary | ICD-10-CM | POA: Diagnosis not present

## 2013-08-12 DIAGNOSIS — I6789 Other cerebrovascular disease: Secondary | ICD-10-CM

## 2013-08-12 DIAGNOSIS — R079 Chest pain, unspecified: Secondary | ICD-10-CM

## 2013-08-12 DIAGNOSIS — I639 Cerebral infarction, unspecified: Secondary | ICD-10-CM

## 2013-08-12 DIAGNOSIS — I6340 Cerebral infarction due to embolism of unspecified cerebral artery: Secondary | ICD-10-CM | POA: Diagnosis not present

## 2013-08-12 DIAGNOSIS — R002 Palpitations: Secondary | ICD-10-CM

## 2013-08-12 DIAGNOSIS — R269 Unspecified abnormalities of gait and mobility: Secondary | ICD-10-CM

## 2013-08-12 HISTORY — PX: LOOP RECORDER IMPLANT: SHX5954

## 2013-08-12 HISTORY — PX: LOOP RECORDER IMPLANT: SHX5477

## 2013-08-12 HISTORY — PX: TEE WITHOUT CARDIOVERSION: SHX5443

## 2013-08-12 LAB — GLUCOSE, CAPILLARY
Glucose-Capillary: 111 mg/dL — ABNORMAL HIGH (ref 70–99)
Glucose-Capillary: 103 mg/dL — ABNORMAL HIGH (ref 70–99)
Glucose-Capillary: 112 mg/dL — ABNORMAL HIGH (ref 70–99)
Glucose-Capillary: 99 mg/dL (ref 70–99)

## 2013-08-12 LAB — MDC_IDC_ENUM_SESS_TYPE_REMOTE
Date Time Interrogation Session: 20150708170443
Zone Setting Detection Interval: 2000 ms
Zone Setting Detection Interval: 3000 ms
Zone Setting Detection Interval: 360 ms

## 2013-08-12 LAB — C3 COMPLEMENT: C3 Complement: 153 mg/dL (ref 90–180)

## 2013-08-12 LAB — TROPONIN I
Troponin I: <0.3 ng/mL (ref <0.30)
Troponin I: <0.3 ng/mL (ref <0.30)
Troponin I: <0.3 ng/mL (ref <0.30)

## 2013-08-12 LAB — ANTI-DNA ANTIBODY, DOUBLE-STRANDED: ds DNA Ab: <1 IU/mL

## 2013-08-12 LAB — ANCA SCREEN W REFLEX TITER
Atypical p-ANCA Screen: NEGATIVE
c-ANCA Screen: NEGATIVE
p-ANCA Screen: NEGATIVE

## 2013-08-12 LAB — C4 COMPLEMENT: Complement C4, Body Fluid: 28 mg/dL (ref 10–40)

## 2013-08-12 LAB — SJOGRENS SYNDROME-A EXTRACTABLE NUCLEAR ANTIBODY: SSA (Ro) (ENA) Antibody, IgG: <1

## 2013-08-12 LAB — SJOGRENS SYNDROME-B EXTRACTABLE NUCLEAR ANTIBODY: SSB (La) (ENA) Antibody, IgG: <1

## 2013-08-12 SURGERY — LOOP RECORDER IMPLANT
Anesthesia: LOCAL

## 2013-08-12 SURGERY — ECHOCARDIOGRAM, TRANSESOPHAGEAL
Anesthesia: Moderate Sedation

## 2013-08-12 MED ORDER — FENTANYL CITRATE 0.05 MG/ML IJ SOLN
INTRAMUSCULAR | Status: AC
  Filled 2013-08-12: qty 2

## 2013-08-12 MED ORDER — LIDOCAINE-EPINEPHRINE 1 %-1:100000 IJ SOLN
INTRAMUSCULAR | Status: AC
  Filled 2013-08-12: qty 1

## 2013-08-12 MED ORDER — MIDAZOLAM HCL 5 MG/ML IJ SOLN
INTRAMUSCULAR | Status: AC
  Filled 2013-08-12: qty 2

## 2013-08-12 MED ORDER — MIDAZOLAM HCL 10 MG/2ML IJ SOLN
INTRAMUSCULAR | Status: DC | PRN
  Administered 2013-08-12 (×2): 2 mg via INTRAVENOUS

## 2013-08-12 MED ORDER — BUTAMBEN-TETRACAINE-BENZOCAINE 2-2-14 % EX AERO
INHALATION_SPRAY | CUTANEOUS | Status: DC | PRN
  Administered 2013-08-12: 2 via TOPICAL

## 2013-08-12 MED ORDER — FENTANYL CITRATE 0.05 MG/ML IJ SOLN
INTRAMUSCULAR | Status: DC | PRN
  Administered 2013-08-12 (×2): 25 ug via INTRAVENOUS

## 2013-08-12 NOTE — Progress Notes (Signed)
  Echocardiogram Echocardiogram Transesophageal has been performed.  Georgian CoWILLIAMS, Katelind Pytel 08/12/2013, 10:35 AM

## 2013-08-12 NOTE — CV Procedure (Signed)
Pre op Dx CRYPTOGENIC STROKE Post op Dx    Procedure  Loop Recorder implantation  After routine prep and drape of the left parasternal area, a small incision was created. A [employer] LINQ Reveal Loop Recorder  Serial Number  [id number] was inserted.    SteriStrip dressing was  applied.  The patient tolerated the procedure without apparent complication.

## 2013-08-12 NOTE — Progress Notes (Signed)
[facility] PROGRESS NOTE  Kara Mendoza GNF:[id number] DOB: 1950-03-25 DOA: 08/09/2013 PCP: Dorrene GermanAVBUERE,EDWIN A, MD Brief Narrative: Kara HammockSheila L Schwegel is a 63 y.o. female with a history of a left temporal parietal infarction back in 2008 at which time she presented with expressive aphasia, has history of hypertension, dyslipidemia, tobacco abuse as well, presented to the ER with complaints of dysarthria and worsening LUE numbness. Stroke workup underway, slight acute changes on MRI in L pareital lobe, NEuro following, continued on Plavix/statin therapy. Plan for TEE tomorrow and CIR possibly for rehab  Assessment/Plan: 1. Chronic L Parietal infarct with extension of Acute infarct in L parietal lobe - continue plavix/statin - Neuro following, per Dr.Xu MRI doesn't quite explain stuttering, stress could be contributing -Stuttering likely stress induced per Neuro - 2D ECHO EF 55% no cardiac source of thrombus - Carotid duplex: normal CTA  - Pt/OT/St following CIR recommended -TEE neg,and Loop recorder implanted -LDL 128, continue statin, hbaic 5.8 -await CIR follow up decision  2. Hypertension.  - resume Losartan at low dose, hold HCTZ  3. Dyslipidemia.  -continue statin   4. Type 2 diabetes mellitus -hold metformin, SSI -hbaic 5.8  5. Hyperthyroidism.  -Will continue Methimazole, TSH normal  6. Suspected RSD LUE: -PT, percocet PRN -obtain US to eval for DVT  7. Anxiety with home/rent etc -children trying to sort this issue   DVT prophylaxis. Lovenox  Code Status: Full Code Family Communication: none at bedside Disposition Plan: home pending workup  Procedures TEE 7/8- per Dr Eden EmmsNishan Impression:  1) No source of embolus  2) Negative bubble study  3) Normal EF 60%  4) No significant valve disease  5) No LAA thrombus  6) No aortic debris  7) Normal RV  8) No effusion   7/8 ILR- per Dr Graciela HusbandsKlein  Consultants:  Neuro  HPI/Subjective: Still with stuttering,denies  new c/o  Objective: Filed Vitals:   08/12/13 1355  BP: 114/57  Pulse: 78  Temp: 98.3 F (36.8 C)  Resp: 18    Intake/Output Summary (Last 24 hours) at 08/12/13 1546 Last data filed at 08/12/13 1300  Gross per 24 hour  Intake    460 ml  Output      0 ml  Net    460 ml   Filed Weights   08/09/13 2030  Weight: 78.7 kg (173 lb 8 oz)    Exam:   General:  AAOx3, no distress, stuttering speech  Cardiovascular: S1S2/RRR  Respiratory: CTAB  Abdomen: soft, NT, BS present  Musculoskeletal: no edema c/c  Neuro: dysarthric, motor 5/5, sensory light touch intact, DTR 2plus,    Data Reviewed: Basic Metabolic Panel:  Recent Labs Lab 08/09/13 1630 08/10/13 0848 08/11/13 0420  NA 140 141 142  K 4.1 3.5* 4.0  CL 101 101 103  CO2 26 26 26   GLUCOSE 109* 142* 111*  BUN 14 11 11   CREATININE 0.80 0.72 0.80  CALCIUM 10.2 9.8 9.8   Liver Function Tests:  Recent Labs Lab 08/09/13 1630 08/11/13 0420  AST 14 14  ALT 16 15  ALKPHOS 69 59  BILITOT 0.6 0.6  PROT 7.5 6.6  ALBUMIN 4.1 3.7   No results found for this basename: LIPASE, AMYLASE,  in the last 168 hours No results found for this basename: AMMONIA,  in the last 168 hours CBC:  Recent Labs Lab 08/09/13 1630 08/10/13 0848 08/11/13 0420  WBC 9.6 6.7 7.7  HGB 14.9 14.8 13.7  HCT 43.3 42.6 41.3  MCV  86.4 86.6 88.4  PLT 343 334 321   Cardiac Enzymes:  Recent Labs Lab 08/09/13 1630 08/12/13 1145  TROPONINI <0.30 <0.30   BNP (last 3 results) No results found for this basename: PROBNP,  in the last 8760 hours CBG:  Recent Labs Lab 08/11/13 1132 08/11/13 1621 08/11/13 2151 08/12/13 0649 08/12/13 1205  GLUCAP 110* 114* 125* 111* 103*    Recent Results (from the past 240 hour(s))  MRSA PCR SCREENING     Status: None   Collection Time    08/10/13  1:36 AM      Result Value Ref Range Status   MRSA by PCR NEGATIVE  NEGATIVE Final   Comment:            The GeneXpert MRSA Assay (FDA      approved for NASAL specimens     only), is one component of a     comprehensive MRSA colonization     surveillance program. It is not     intended to diagnose MRSA     infection nor to guide or     monitor treatment for     MRSA infections.     Studies: No results found.  Scheduled Meds: . clopidogrel  75 mg Oral Daily  . enoxaparin (LOVENOX) injection  40 mg Subcutaneous Q24H  . insulin aspart  0-15 Units Subcutaneous TID WC  . insulin aspart  0-5 Units Subcutaneous QHS  . losartan  50 mg Oral Daily  . methimazole  5 mg Oral Daily  . pregabalin  50 mg Oral TID  . simvastatin  40 mg Oral QHS   Continuous Infusions:  Antibiotics Given (last 72 hours)   None      Principal Problem:   CVA (cerebral infarction) Active Problems:   GERD (gastroesophageal reflux disease)   Hyperlipidemia   Hypertension   Dysarthria    Time spent: 30min    Ebon Ketchum C  [facility] Pager [phone]. If 7PM-7AM, please contact night-coverage at [email or web address], password [email or web address] 08/12/2013, 3:46 PM  LOS: 3 days

## 2013-08-12 NOTE — CV Procedure (Signed)
   Transesophageal Echocardiogram: Indication: CVA Sedation: Versed: 4, Fentanyl: 50, Other:   ASA: 2, Airway: 2  Procedure:  The patient was moderately sedated with the above doses of versed and fentanyl.  Using digital technique an omniplane probe was advanced into the distal esophagus without incident. Transgastric imaging revealed normal LV function with no RWMA;s and no mural apical thrombus.   .  Estimated ejection fraction was 60%.  Right sided cardiac chambers were normal with no evidence of pulmonary hypertension.  The pulmonary and tricuspid valves were structurally normal.  There was mild  TR The mitral valve was structurally normal with no  mitral regurgitation.    The aortic valve was trileaflet with no AS/AR The aortic root was normal.    Imaging of the septum showed no ASD or VSD Bubble study was negative for shunt 2D and color flow confirmed no PFO  The LAA was well visualized in orthogonal views.  There was no spontaneous contrast and no thrombus.    The descending thoracic aorta had no  mural aortic debris with no evidence of aneurysmal dilation or disection  Impression:  1) No source of embolus 2) Negative bubble study 3) Normal EF 60% 4)  No significant valve disease 5)  No LAA thrombus 6)  No aortic debris 7) Normal RV 8)  No effusion  Patient to have ILR by Dr Charleston RopesKlein   Kara Mendoza [facility] 08/12/2013 10:31 AM

## 2013-08-12 NOTE — Progress Notes (Signed)
Occupational Therapy Treatment  Patient Details Name: Marolyn HammockSheila L Pollio MRN: [id number] DOB: 1950/02/28 Today's Date: 08/12/2013    History of present illness Admitted with slurred speech. PMHx left temporal parietal infarction back in 2008 at which time she presented with expressive aphasia, has history of hypertension, dyslipidemia, tobacco abuse as well. Pt with very painfull LUE    OT comments  Focus of today's session was on toileting, grooming and reviewing stress loading programs. Pt is progressing towards goals and would benefit from f/u of CIR to increase independence to modified independent level prior to D/C home without supervision. Will continue to follow to increase stress loading program weight and time.  Follow Up Recommendations  CIR    Equipment Recommendations  3 in 1 bedside comode       Precautions / Restrictions Precautions Precautions: Fall Precaution Comments: LUE pain       Mobility Bed Mobility Overal bed mobility: Needs Assistance Bed Mobility: Sit to Supine;Supine to Sit     Supine to sit: Supervision Sit to supine: Supervision   General bed mobility comments: moderate use of the rail  Transfers Overall transfer level: Needs assistance Equipment used: Straight cane Transfers: Sit to/from Stand Sit to Stand: Min guard         General transfer comment: slow and guarded, but guard for safety only.    Balance Overall balance assessment: Needs assistance Sitting-balance support: No upper extremity supported;Feet supported Sitting balance-Leahy Scale: Fair     Standing balance support: Single extremity supported;During functional activity Standing balance-Leahy Scale: Poor                     ADL Overall ADL's : Needs assistance/impaired     Grooming: Wash/dry hands;Min guard;Standing                   Toilet Transfer: Ambulation;Min guard;Regular Toilet;Grab bars   Toileting- Clothing Manipulation and Hygiene: Sit  to/from stand;Min guard       Functional mobility during ADLs: Cane;Min guard General ADL Comments: Reviewed stress loading program with pt and had her demonstrate understanding of it by putting weight through LUE and applying as much pressure as possible onto bath towel; then holding onto the pericare soap bottle (8 oz) in L hand with elbow extended. Instructed pt to do both exercises for 3 minutes 3 times a day, building up to 5 minutes each and gradually increasing the weight.       Vision  Pt has Rt lower visual field deficits. Pt stated that she is waiting for her daughter to bring her glasses to her.                          Cognition   Behavior During Therapy: WFL for tasks assessed/performed Overall Cognitive Status: Within Functional Limits for tasks assessed                                    Pertinent Vitals/ Pain       Pt stated she had pain with exercises and was repositioned in the bed.         Frequency Min 3X/week     Progress Toward Goals  OT Goals(current goals can now be found in the care plan section)  Progress towards OT goals: Progressing toward goals  Acute Rehab OT Goals Patient Stated Goal: be able to live  by myself OT Goal Formulation: With patient Time For Goal Achievement: 08/19/13 Potential to Achieve Goals: Good  Plan Discharge plan remains appropriate       End of Session Equipment Utilized During Treatment:  (cane)   Activity Tolerance Patient tolerated treatment well   Patient Left in bed;with call bell/phone within reach           Time:  -     Charges:    Maurene CapesKeene, Barkley Kratochvil 08/12/2013, 3:13 PM

## 2013-08-12 NOTE — Progress Notes (Addendum)
Received order from Norlene Campbellierdre Fraller, NP regarding Carotid Doppler order from July 5th.  Dr. Jomarie LongsJoseph confirmed order could be cancelled since patient had CTA of neck. Please contact us if patient does need Carotid Doppler performed now.   Farrel DemarkJill Eunice, RDMS, RVT 08/12/2013 5:27 PM

## 2013-08-12 NOTE — Progress Notes (Signed)
Physical Therapy Treatment Patient Details Name: Kara Mendoza MRN: [id number] DOB: 1950/03/04 Today's Date: 08/12/2013    History of Present Illness Admitted with slurred speech. PMHx left temporal parietal infarction back in 2008 at which time she presented with expressive aphasia, has history of hypertension, dyslipidemia, tobacco abuse as well. Pt with very painfull LUE     PT Comments    Progressing steadily. Still needing some rehab prior to being safe alone at home with intermittent assist   Follow Up Recommendations  CIR     Equipment Recommendations  None recommended by PT    Recommendations for Other Services       Precautions / Restrictions Precautions Precautions: Fall Precaution Comments: LUE pain    Mobility  Bed Mobility Overal bed mobility: Needs Assistance Bed Mobility: Sit to Supine;Supine to Sit     Supine to sit: Supervision Sit to supine: Supervision   General bed mobility comments: min use of the rail  Transfers Overall transfer level: Needs assistance Equipment used: Straight cane Transfers: Sit to/from Stand Sit to Stand: Min guard         General transfer comment: initial unsteadiness on standing needing guard  Ambulation/Gait Ambulation/Gait assistance: Min guard;Min assist Ambulation Distance (Feet): 200 Feet Assistive device: Straight cane Gait Pattern/deviations: Step-through pattern Gait velocity: slower Gait velocity interpretation: Below normal speed for age/gender General Gait Details: generally steady today with infrequent min assist for episodes of instability   Stairs Stairs: Yes Stairs assistance: Min guard Stair Management: One rail Right;Step to pattern;Forwards;With cane Number of Stairs: 3 General stair comments: steady with cane  Wheelchair Mobility    Modified Rankin (Stroke Patients Only) Modified Rankin (Stroke Patients Only) Pre-Morbid Rankin Score: Moderate disability Modified Rankin: Moderately  severe disability     Balance Overall balance assessment: No apparent balance deficits (not formally assessed) Sitting-balance support: No upper extremity supported;Feet supported Sitting balance-Leahy Scale: Fair     Standing balance support: Single extremity supported;During functional activity Standing balance-Leahy Scale: Poor                      Cognition Arousal/Alertness: Awake/alert Behavior During Therapy: WFL for tasks assessed/performed Overall Cognitive Status: Within Functional Limits for tasks assessed                      Exercises      General Comments        Pertinent Vitals/Pain     Home Living                      Prior Function            PT Goals (current goals can now be found in the care plan section) Acute Rehab PT Goals Patient Stated Goal: be able to live by myself PT Goal Formulation: With patient Time For Goal Achievement: 08/24/13 Potential to Achieve Goals: Good Progress towards PT goals: Progressing toward goals    Frequency  Min 3X/week    PT Plan Current plan remains appropriate    Co-evaluation             End of Session   Activity Tolerance: Patient tolerated treatment well Patient left: in bed     Time: [id number] PT Time Calculation (min): 16 min  Charges:  $Gait Training: 8-22 mins                    G Codes:  Kara Mendoza, Kara Mendoza 08/12/2013, 4:37 PM 08/12/2013  [place] Kara Mendoza, PT [phone]  (pager)

## 2013-08-12 NOTE — H&P (Signed)
Physical Medicine and Rehabilitation Admission H&P    Chief Complaint  Patient presents with  . Aphasia  . Altered Mental Status  : HPI: Kara Mendoza is a 63 y.o. 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 Patient independent with a cane and walker prior to admission living alone with a home health aide 2 hours daily. Admitted 08/09/2013 with increased aphasia. MRI of the brain showed extension of acute infarcts left parietal operculum, left insula and inferior parietal lobes. MRA of the head without occlusion.  CTA of head and neck negative. Echocardiogram with ejection fraction of 60% no wall motion abnormalities. Patient did not receive TPA. Neurology followup currently continues on Plavix for CVA prophylaxis as well as subcutaneous Lovenox for DVT prophylaxis. TEE completed that showed no PFO without thrombus and underwent loop recorder implantation 08/12/2013 per Dr. Caryl Comes. Physical therapy evaluation 08/10/2013 with recommendations of physical medicine rehabilitation consult. Patient was admitted for comprehensive rehabilitation program   ROS Review of Systems  Constitutional: Positive for malaise/fatigue.  Gastrointestinal: Positive for constipation.  Musculoskeletal:  Chronic left arm pain  Neurological: Positive for speech change, weakness and headaches.  Psychiatric/Behavioral: Positive for depression.  Anxiety  All other systems reviewed and are negative  Past Medical History  Diagnosis Date  . Stroke   . Neuromuscular disorder   . Hypertension   . Hyperlipidemia   . Asthma   . Diabetes mellitus   . Hyperthyroidism   . Esophageal reflux   . Constipation   . Major depressive disorder, recurrent episode, moderate   . Anxiety state, unspecified   . History of fall   . Abnormality of  gait   . Chronic pain syndrome   . Tobacco use disorder   . Osteoarthritis   . Dysarthria   . Acute bronchitis   . Chronic arm pain 2008    left  . Chronic leg pain 2008    left  . Fatigue   . Chronic headache    Past Surgical History  Procedure Laterality Date  . Cesarean section      x3  . Finger surgery     Family History  Problem Relation Age of Onset  . Diabetes Sister   . Cancer Father   . Diabetes Brother    Social History:  reports that she has been smoking Cigarettes.  She has a 3 pack-year smoking history. She has never used smokeless tobacco. She reports that she does not drink alcohol or use illicit drugs. Allergies:  Allergies  Allergen Reactions  . Aspirin Other (See Comments)    Possibly itching and "feeling little weird"  . Penicillins Hives   Medications Prior to Admission  Medication Sig Dispense Refill  . albuterol (PROVENTIL HFA;VENTOLIN HFA) 108 (90 BASE) MCG/ACT inhaler Inhale 3 puffs into the lungs every 6 (six) hours as needed for wheezing or shortness of breath.       . buprenorphine (BUTRANS) 10 MCG/HR PTWK patch Place 10 mcg onto the skin once a week.      . meloxicam (MOBIC) 7.5 MG tablet Take 7.5 mg by mouth daily.      . metFORMIN (GLUCOPHAGE) 500 MG tablet Take 500 mg by mouth daily.      Marland Kitchen oxyCODONE-acetaminophen (PERCOCET) 10-325 MG per tablet Take 1 tablet by mouth every 6 (six) hours as needed. For pain      .  oxyCODONE-acetaminophen (PERCOCET/ROXICET) 5-325 MG per tablet Take 1 tablet by mouth every 6 (six) hours as needed for severe pain.      . sertraline (ZOLOFT) 50 MG tablet Take 1 tablet by mouth at bedtime.      . Tapentadol HCl (NUCYNTA) 100 MG TABS Take 50 mg by mouth 2 (two) times daily.      . clopidogrel (PLAVIX) 75 MG tablet Take 1 tablet by mouth daily.      . diclofenac (VOLTAREN) 75 MG EC tablet Take 75 mg by mouth 2 (two) times daily.      . diclofenac sodium (VOLTAREN) 1 % GEL Apply 4 g topically 4 (four) times daily as  needed.      . hydrochlorothiazide (HYDRODIURIL) 50 MG tablet Take 50 mg by mouth daily.      . losartan (COZAAR) 100 MG tablet Take 1 tablet by mouth daily.      . losartan-hydrochlorothiazide (HYZAAR) 100-25 MG per tablet Take 1 tablet by mouth daily.      . methimazole (TAPAZOLE) 5 MG tablet Take 5 mg by mouth daily.      . Multiple Vitamins-Calcium (ONE-A-DAY WOMENS FORMULA PO) Take 1 tablet by mouth daily.      . omeprazole (PRILOSEC) 20 MG capsule Take 20 mg by mouth 2 (two) times daily.      . pregabalin (LYRICA) 50 MG capsule Take 50 mg by mouth 3 (three) times daily.      . sennosides-docusate sodium (SENOKOT-S) 8.6-50 MG tablet Take 1 tablet by mouth daily.      . simvastatin (ZOCOR) 20 MG tablet Take 10 mg by mouth at bedtime. 1/2 tab qd      . zolpidem (AMBIEN) 5 MG tablet Take 5 mg by mouth at bedtime.        Home: Home Living Family/patient expects to be discharged to:: Private residence Living Arrangements: Alone Available Help at Discharge: Family;Personal care attendant Type of Home: House Home Access: Stairs to enter Entrance Stairs-Number of Steps: 6 Entrance Stairs-Rails: Right Home Layout: One level Home Equipment: Walker - 2 wheels;Cane - single point   Functional History: Prior Function Level of Independence: Needs assistance Gait / Transfers Assistance Needed: uses cane, but has not previously needed physical support ADL's / Homemaking Assistance Needed: Nurse aide assistance 2 hours a day to help with B/D since about 9 months ago when she started having pain in her LUE (which has continued to get worse), up until then the aid mainly helped her around the house for IADLs  Functional Status:  Mobility: Bed Mobility Overal bed mobility: Needs Assistance Bed Mobility: Sit to Supine Supine to sit: Min guard Sit to supine: Supervision General bed mobility comments: moderate use of the rail Transfers Overall transfer level: Needs assistance Equipment used:  Straight cane Transfers: Sit to/from Stand Sit to Stand: Min guard General transfer comment: slow and guarded, but guard for safety only. Ambulation/Gait Ambulation/Gait assistance: Min guard Ambulation Distance (Feet): 600 Feet Assistive device: Straight cane Gait Pattern/deviations: Step-through pattern Gait velocity: slow speed preferred, but able to noticeably increase speed. Gait velocity interpretation: Below normal speed for age/gender General Gait Details: generally steady with episodes of instability with sudden turns, scanning Stairs: Yes Stairs assistance: Min guard Stair Management: Step to pattern;Forwards;With cane Number of Stairs: 6 General stair comments: steady with cane    ADL: ADL Overall ADL's : Needs assistance/impaired Eating/Feeding: Set up;Sitting Grooming: Sitting;Wash/dry hands;Min guard Upper Body Bathing: Minimal assitance;Sitting Lower Body Bathing: Moderate assistance;Sit   to/from stand Upper Body Dressing : Moderate assistance;Sitting Lower Body Dressing: Maximal assistance;Sit to/from stand Toilet Transfer: Ambulation;Min guard Toileting- Water quality scientist and Hygiene: Sit to/from stand;Min guard Functional mobility during ADLs: Cane;Min guard General ADL Comments: Pt able to ambulate to/from the bathroom with min guard A for safety. Pt used LUE to push up from toilet using the grab bars and stood at the sink for hand hygiene. Educated pt on stress loading program for CRPS and had her put weight through LUE pushing onto bath towel and apply as much pressure as possible using a back and forth motion for 3 minutes; then had pt hold a lotion bottle with L hand with elbow extended for 3 minutes. Instructed pt to repeat this process one more time today.  Cognition: Cognition Overall Cognitive Status: Within Functional Limits for tasks assessed Arousal/Alertness: Awake/alert Orientation Level: Oriented X4 Attention: Selective Selective Attention:  Appears intact Memory: Appears intact Awareness: Appears intact Problem Solving: Appears intact Safety/Judgment: Appears intact Cognition Arousal/Alertness: Awake/alert Behavior During Therapy: WFL for tasks assessed/performed Overall Cognitive Status: Within Functional Limits for tasks assessed   Physical Exam: Blood pressure 113/55, pulse 64, temperature 98.3 F (36.8 C), temperature source Oral, resp. rate 18, height _0  (1.575 m), weight 78.7 kg (173 lb 8 oz), SpO2 97.00%. Physical Exam General: no distress.  Eyes: EOM are normal.  Mouth: oral mucosa pink/moist Neck: Normal range of motion. Neck supple. No thyromegaly present.  Cardiovascular: Normal rate and regular rhythm. No murmurs Respiratory: Effort normal and breath sounds normal. No respiratory distress. No wheezes GI: Soft. Bowel sounds are normal. She exhibits no distension.  Musculoskeletal:  Noted hypersensitivity to left forearm to palpation she is quite guarded  Neurological: She is alert and oriented x 3. Reasonable insight and awareness. Follows all simple commands. Stuttering speech is better. LUE grossly 2+/5 with pain still an issue. LLE is 3 to 4-/5 prox to distal. Sensation 1/2 LUE with dyesthesisas still present. Distal sensory loss to LT in both legs below the knees. Speech dysarthric. Left facial weakness.  Psych: anxiety improved, pleasant and cooperative.  Results for orders placed during the hospital encounter of 08/09/13 (from the past 48 hour(s))  GLUCOSE, CAPILLARY     Status: Abnormal   Collection Time    08/10/13  8:11 AM      Result Value Ref Range   Glucose-Capillary 106 (*) 70 - 99 mg/dL  HEMOGLOBIN A1C     Status: Abnormal   Collection Time    08/10/13  8:48 AM      Result Value Ref Range   Hemoglobin A1C 5.8 (*) <5.7 %   Comment: (NOTE)                                                                               According to the ADA Clinical Practice Recommendations for 2011, when       HbA1c is used as a screening test:      >=6.5%   Diagnostic of Diabetes Mellitus               (if abnormal result is confirmed)     5.7-6.4%   Increased risk of developing Diabetes Mellitus  References:Diagnosis and Classification of Diabetes Mellitus,Diabetes     EOFH,2197,58(ITGPQ 1):S62-S69 and Standards of Medical Care in             Diabetes - 2011,Diabetes DIYM,4158,30 (Suppl 1):S11-S61.   Mean Plasma Glucose 120 (*) <117 mg/dL   Comment: Performed at [facility]  CBC     Status: None   Collection Time    08/10/13  8:48 AM      Result Value Ref Range   WBC 6.7  4.0 - 10.5 K/uL   RBC 4.92  3.87 - 5.11 MIL/uL   Hemoglobin 14.8  12.0 - 15.0 g/dL   HCT 42.6  36.0 - 46.0 %   MCV 86.6  78.0 - 100.0 fL   MCH 30.1  26.0 - 34.0 pg   MCHC 34.7  30.0 - 36.0 g/dL   RDW 12.7  11.5 - 15.5 %   Platelets 334  150 - 400 K/uL  BASIC METABOLIC PANEL     Status: Abnormal   Collection Time    08/10/13  8:48 AM      Result Value Ref Range   Sodium 141  137 - 147 mEq/L   Potassium 3.5 (*) 3.7 - 5.3 mEq/L   Chloride 101  96 - 112 mEq/L   CO2 26  19 - 32 mEq/L   Glucose, Bld 142 (*) 70 - 99 mg/dL   BUN 11  6 - 23 mg/dL   Creatinine, Ser 0.72  0.50 - 1.10 mg/dL   Calcium 9.8  8.4 - 10.5 mg/dL   GFR calc non Af Amer 89 (*) >90 mL/min   GFR calc Af Amer >90  >90 mL/min   Comment: (NOTE)     The eGFR has been calculated using the CKD EPI equation.     This calculation has not been validated in all clinical situations.     eGFR's persistently <90 mL/min signify possible Chronic Kidney     Disease.   Anion gap 14  5 - 15  LIPID PANEL     Status: Abnormal   Collection Time    08/10/13  8:48 AM      Result Value Ref Range   Cholesterol 208 (*) 0 - 200 mg/dL   Triglycerides 135  <150 mg/dL   HDL 53  >39 mg/dL   Total CHOL/HDL Ratio 3.9     VLDL 27  0 - 40 mg/dL   LDL Cholesterol 128 (*) 0 - 99 mg/dL   Comment:            Total Cholesterol/HDL:CHD Risk     Coronary Heart  Disease Risk Table                         Men   Women      1/2 Average Risk   3.4   3.3      Average Risk       5.0   4.4      2 X Average Risk   9.6   7.1      3 X Average Risk  23.4   11.0                Use the calculated Patient Ratio     above and the CHD Risk Table     to determine the patient's CHD Risk.                ATP III CLASSIFICATION (LDL):      <  100     mg/dL   Optimal      100-129  mg/dL   Near or Above                        Optimal      130-159  mg/dL   Borderline      160-189  mg/dL   High      >190     mg/dL   Very High  GLUCOSE, CAPILLARY     Status: Abnormal   Collection Time    08/10/13 11:57 AM      Result Value Ref Range   Glucose-Capillary 105 (*) 70 - 99 mg/dL  GLUCOSE, CAPILLARY     Status: None   Collection Time    08/10/13  4:41 PM      Result Value Ref Range   Glucose-Capillary 93  70 - 99 mg/dL  GLUCOSE, CAPILLARY     Status: Abnormal   Collection Time    08/10/13  9:33 PM      Result Value Ref Range   Glucose-Capillary 161 (*) 70 - 99 mg/dL   Comment 1 Notify RN     Comment 2 Documented in Chart    CBC     Status: None   Collection Time    08/11/13  4:20 AM      Result Value Ref Range   WBC 7.7  4.0 - 10.5 K/uL   RBC 4.67  3.87 - 5.11 MIL/uL   Hemoglobin 13.7  12.0 - 15.0 g/dL   HCT 41.3  36.0 - 46.0 %   MCV 88.4  78.0 - 100.0 fL   MCH 29.3  26.0 - 34.0 pg   MCHC 33.2  30.0 - 36.0 g/dL   RDW 12.8  11.5 - 15.5 %   Platelets 321  150 - 400 K/uL  BASIC METABOLIC PANEL     Status: Abnormal   Collection Time    08/11/13  4:20 AM      Result Value Ref Range   Sodium 142  137 - 147 mEq/L   Potassium 4.0  3.7 - 5.3 mEq/L   Chloride 103  96 - 112 mEq/L   CO2 26  19 - 32 mEq/L   Glucose, Bld 111 (*) 70 - 99 mg/dL   BUN 11  6 - 23 mg/dL   Creatinine, Ser 0.80  0.50 - 1.10 mg/dL   Calcium 9.8  8.4 - 10.5 mg/dL   GFR calc non Af Amer 76 (*) >90 mL/min   GFR calc Af Amer 88 (*) >90 mL/min   Comment: (NOTE)     The eGFR has been  calculated using the CKD EPI equation.     This calculation has not been validated in all clinical situations.     eGFR's persistently <90 mL/min signify possible Chronic Kidney     Disease.   Anion gap 13  5 - 15  VITAMIN B12     Status: Abnormal   Collection Time    08/11/13  4:20 AM      Result Value Ref Range   Vitamin B-12 1016 (*) 211 - 911 pg/mL   Comment: Performed at [facility]     Status: None   Collection Time    08/11/13  4:20 AM      Result Value Ref Range   Folate >20.0     Comment: (NOTE)     Reference Ranges  Deficient:       0.4 - 3.3 ng/mL            Indeterminate:   3.4 - 5.4 ng/mL            Normal:              > 5.4 ng/mL     Performed at [facility]     Status: None   Collection Time    08/11/13  4:20 AM      Result Value Ref Range   Total Protein 6.6  6.0 - 8.3 g/dL   Albumin 3.7  3.5 - 5.2 g/dL   AST 14  0 - 37 U/L   ALT 15  0 - 35 U/L   Alkaline Phosphatase 59  39 - 117 U/L   Total Bilirubin 0.6  0.3 - 1.2 mg/dL   Bilirubin, Direct <0.2  0.0 - 0.3 mg/dL   Indirect Bilirubin NOT CALCULATED  0.3 - 0.9 mg/dL  GLUCOSE, CAPILLARY     Status: Abnormal   Collection Time    08/11/13  7:05 AM      Result Value Ref Range   Glucose-Capillary 109 (*) 70 - 99 mg/dL   Comment 1 Notify RN     Comment 2 Documented in Chart    RPR     Status: None   Collection Time    08/11/13 11:10 AM      Result Value Ref Range   RPR NON REAC  NON REAC   Comment: Performed at [facility]  HIV ANTIBODY (ROUTINE TESTING)     Status: None   Collection Time    08/11/13 11:10 AM      Result Value Ref Range   HIV 1&2 Ab, 4th Generation NONREACTIVE  NONREACTIVE   Comment: (NOTE)     A NONREACTIVE HIV Ag/Ab result does not exclude HIV infection since     the time frame for seroconversion is variable. If acute HIV infection     is suspected, a HIV-1 RNA Qualitative TMA test is recommended.     HIV-1/2  Antibody Diff         Not indicated.     HIV-1 RNA, Qual TMA           Not indicated.     PLEASE NOTE: This information has been disclosed to you from records     whose confidentiality may be protected by state law. If your state     requires such protection, then the state law prohibits you from making     any further disclosure of the information without the specific written     consent of the person to whom it pertains, or as otherwise permitted     by law. A general authorization for the release of medical or other     information is NOT sufficient for this purpose.     The performance of this assay has not been clinically validated in     patients less than 87 years old.     Performed at [facility]     Status: None   Collection Time    08/11/13 11:10 AM      Result Value Ref Range   Sed Rate 3  0 - 22 mm/hr  HIGH SENSITIVITY CRP     Status: None   Collection Time    08/11/13 11:10 AM      Result Value Ref  Range   CRP, High Sensitivity 1.3     Comment: (NOTE)              <1.0 mg/L              Low risk              1.0 to 3.0 mg/L        Average risk              >3.0 mg/L              High risk     If a level of >10.0 mg/L is seen, an obvious source of infection or     inflammation should be sought. If found, this value should be     disregarded.  Suggest repeat testing after resolution of infection     and/or inflammation.     Performed at [place]     Status: None   Collection Time    08/11/13 11:10 AM      Result Value Ref Range   C3 Complement 153  90 - 180 mg/dL   Comment: Performed at [facility]     Status: None   Collection Time    08/11/13 11:10 AM      Result Value Ref Range   Complement C4, Body Fluid 28  10 - 40 mg/dL   Comment: Performed at [place] [facility]     Status: None   Collection Time    08/11/13 11:10 AM      Result Value Ref Range   Homocysteine  6.5  4.0 - 15.4 umol/L   Comment: Performed at [facility]     Status: None   Collection Time    08/11/13 11:10 AM      Result Value Ref Range   Rheumatoid Factor <10  <=14 IU/mL   Comment: (NOTE)                             Interpretive Table                        Low Positive: 15 - 41 IU/mL                        High Positive:  >= 42 IU/mL     In addition to the RF result, and clinical symptoms including joint     involvement, the 2010 ACR Classification Criteria for     scoring/diagnosing Rheumatoid Arthritis include the results of the     following tests:  CRP (62947), ESR (15010), and CCP (APCA) (65465).     www.rheumatology.org/practice/clinical/classification/ra/ra_2010.asp     Performed at [place], [facility]     Status: Abnormal   Collection Time    08/11/13 11:32 AM      Result Value Ref Range   Glucose-Capillary 110 (*) 70 - 99 mg/dL  GLUCOSE, CAPILLARY     Status: Abnormal   Collection Time    08/11/13  4:21 PM      Result Value Ref Range   Glucose-Capillary 114 (*) 70 - 99 mg/dL  GLUCOSE, CAPILLARY     Status: Abnormal   Collection Time    08/11/13  9:51 PM      Result Value Ref Range   Glucose-Capillary  125 (*) 70 - 99 mg/dL   Mr Brain Wo Contrast  08/10/2013   CLINICAL DATA:  Stroke  EXAM: MRI HEAD WITHOUT CONTRAST  MRA HEAD WITHOUT CONTRAST  TECHNIQUE: Multiplanar, multiecho pulse sequences of the brain and surrounding structures were obtained without intravenous contrast. Angiographic images of the head were obtained using MRA technique without contrast.  COMPARISON:  CT 08/09/2013  FINDINGS: MRI HEAD FINDINGS  Chronic infarct left parietal cortex. Chronic microvascular ischemic changes are present in the white matter bilaterally.  1 cm area of acute infarct in the left parietal operculum. Additional area of acute infarct in the left insula. Small area of acute infarct in the left lower parietal lobe inferior to the  chronic cortical infarct.  Negative for hemorrhage or mass. Ventricle size is normal. No shift of the midline structures.  MRA HEAD FINDINGS  Right vertebral artery is widely patent and is normal. Hypoplastic left vertebral artery is small and irregular but does contribute to the basilar. Basilar widely patent. Superior cerebellar and posterior cerebral arteries are widely patent. Posterior communicating artery is patent bilaterally.  Anterior and middle cerebral arteries are widely patent bilaterally without stenosis.  Negative for cerebral aneurysm.  IMPRESSION: Chronic left parietal infarct. Chronic microvascular ischemic change. Extension of acute infarct into the left parietal operculum, left insula, and left inferior parietal lobe, surrounding the area of chronic infarction.  Small irregular distal left vertebral artery. 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   Electronically Signed   By: Franchot Gallo M.D.   On: 08/10/2013 09:14   Mr Jodene Nam Head/brain Wo Cm  08/10/2013   CLINICAL DATA:  Stroke  EXAM: MRI HEAD WITHOUT CONTRAST  MRA HEAD WITHOUT CONTRAST  TECHNIQUE: Multiplanar, multiecho pulse sequences of the brain and surrounding structures were obtained without intravenous contrast. Angiographic images of the head were obtained using MRA technique without contrast.  COMPARISON:  CT 08/09/2013  FINDINGS: MRI HEAD FINDINGS  Chronic infarct left parietal cortex. Chronic microvascular ischemic changes are present in the white matter bilaterally.  1 cm area of acute infarct in the left parietal operculum. Additional area of acute infarct in the left insula. Small area of acute infarct in the left lower parietal lobe inferior to the chronic cortical infarct.  Negative for hemorrhage or mass. Ventricle size is normal. No shift of the midline structures.  MRA HEAD FINDINGS  Right vertebral artery is widely patent and is normal.  Hypoplastic left vertebral artery is small and irregular but does contribute to the basilar. Basilar widely patent. Superior cerebellar and posterior cerebral arteries are widely patent. Posterior communicating artery is patent bilaterally.  Anterior and middle cerebral arteries are widely patent bilaterally without stenosis.  Negative for cerebral aneurysm.  IMPRESSION: Chronic left parietal infarct. Chronic microvascular ischemic change. Extension of acute infarct into the left parietal operculum, left insula, and left inferior parietal lobe, surrounding the area of chronic infarction.  Small irregular distal left vertebral artery. 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   Electronically Signed   By: Franchot Gallo M.D.   On: 08/10/2013 09:14       Medical Problem List and Plan: 1. Functional deficits secondary to left brain infarcts with word finding deficits, weakness. Status post loop recorder to establish  etiology of infarct 2.  DVT Prophylaxis/Anticoagulation:Left upper extremity doppler negative. Subcutaneous  Lovenox. Monitor platelet counts and any signs of bleeding 3. Pain Management: Lyrica 50 mg 3 times a day, Oxycodone as needed. Monitor with increased mobility 4. Mood/depression/anxiety: Patient on Zoloft 50 mg each bedtime prior to admission 5. Neuropsych: This patient is capable of making decisions on her own behalf. 6. Diabetes mellitus with peripheral neuropathy. Hemoglobin A1c 5.8. Check blood sugars a.c. and at bedtime. Sliding scale insulin. Patient on Glucophage 500 mg daily prior to admission. Resume his needed 7. Hypertension. Cozaar 50 mg daily. Monitor with increased mobility. Patient had been on Hyzaar 100-25 daily prior to admission. 8. Hyperthyroidism. Continue Tapazole. TSH level 3.400 9. Hyperlipidemia. Zocor    Post Admission Physician Evaluation: 1. Functional deficits secondary  to left  brain infarcts with weakness, balance loss and expressive language deficits . 2. Patient is admitted to receive collaborative, interdisciplinary care between the physiatrist, rehab nursing staff, and therapy team. 3. Patient's level of medical complexity and substantial therapy needs in context of that medical necessity cannot be provided at a lesser intensity of care such as a SNF. 4. Patient has experienced substantial functional loss from his/her baseline which was documented above under the "Functional History" and "Functional Status" headings.  Judging by the patient's diagnosis, physical exam, and functional history, the patient has potential for functional progress which will result in measurable gains while on inpatient rehab.  These gains will be of substantial and practical use upon discharge  in facilitating mobility and self-care at the household level. 5. Physiatrist will provide 24 hour management of medical needs as well as oversight of the therapy plan/treatment and provide guidance as appropriate regarding the interaction of the two. 6. 24 hour rehab nursing will assist with bladder management, bowel management, safety, skin/wound care, disease management, medication administration, pain management and patient education  and help integrate therapy concepts, techniques,education, etc. 7. PT will assess and treat for/with: Lower extremity strength, range of motion, stamina, balance, functional mobility, safety, adaptive techniques and equipment, NMR, balance, stroke education, egosupport,leisure awareness.   Goals are: mod I to supervision. 8. OT will assess and treat for/with: ADL's, functional mobility, safety, upper extremity strength, adaptive techniques and equipment, NMR, cognitive perceptual awareness, community reintegrations.   Goals are: mod I to supervision. 9. SLP will assess and treat for/with: language,communication, speech, cognition.  Goals are: mod I. 10. Case Management and  Social Worker will assess and treat for psychological issues and discharge planning. 11. Team conference will be held weekly to assess progress toward goals and to determine barriers to discharge. 12. Patient will receive at least 3 hours of therapy per day at least 5 days per week. 13. ELOS: 7 days       14. Prognosis:  excellent     Meredith Staggers, MD, [facility]   08/12/2013

## 2013-08-12 NOTE — Progress Notes (Signed)
Stroke Team Progress Note  HISTORY  Chief Complaint: dysarthria   HPI:  Kara Mendoza is an 63 y.o. female, right handed, with a past medical history relevant for left MCA distribution infarct back in 2008 with residual language impairment, hypertension, dyslipidemia, tobacco abuse, [facility] for further evaluation and management of dysarthria.  She initially presented to [facility] because of difficulty expressing herself. She indicated that she had had speech trouble since her stroke in 2008, but yesterday around noon time she got very concerned because all of the sudden she couldn't get words out even knowing what she wanted to say, and also had left upper extremity weakness  and that problem has been present evert since.  Denies associated HA, vertigo, double vision, difficulty swallowing, confusion, or vision impairment. No chest pain or palpitations.  She lives alone and patient's daughter said that she called her yesterday around 2 pm and couldn't understand what she was trying to say.  CT/CTA brain showed no acute abnormality.  Takes plavix daily.  Patient c/o pain in the left hand states she has been recently diagnosed with carpel tunnel syndrome and has been taking percocet at home.  Date last known well: 08/08/13  Time last known well: unclear  tPA Given: no, out of the window  SUBJECTIVE Patient's family at bedside. Pain in LUE improved some with lyrica but still painful on touching. Stuttering speech much improving. Pt had significant stress recently due to financial issue to pay rent. Had TEE procedure and inserted loop this morning.   OBJECTIVE Most recent Vital Signs: Filed Vitals:   08/12/13 1100 08/12/13 1104 08/12/13 1118 08/12/13 1355  BP: 119/70 119/70 136/61 114/57  Pulse: 80 80 73 78  Temp:   98.9 F (37.2 C) 98.3 F (36.8 C)  TempSrc:   Oral Oral  Resp: 12 17 18 18   Height:      Weight:      SpO2: 97% 96% 100% 100%   CBG (last 3)   Recent Labs   08/12/13 0649 08/12/13 1205 08/12/13 1634  GLUCAP 111* 103* 112*    IV Fluid Intake:     MEDICATIONS  . clopidogrel  75 mg Oral Daily  . enoxaparin (LOVENOX) injection  40 mg Subcutaneous Q24H  . insulin aspart  0-15 Units Subcutaneous TID WC  . insulin aspart  0-5 Units Subcutaneous QHS  . losartan  50 mg Oral Daily  . methimazole  5 mg Oral Daily  . pregabalin  50 mg Oral TID  . simvastatin  40 mg Oral QHS   PRN:  oxyCODONE-acetaminophen, senna-docusate, zolpidem  Diet:  Carb Control thin liquids Activity:  As tolerated DVT Prophylaxis:  Lovenox  CLINICALLY SIGNIFICANT STUDIES Basic Metabolic Panel:   Recent Labs Lab 08/10/13 0848 08/11/13 0420  NA 141 142  K 3.5* 4.0  CL 101 103  CO2 26 26  GLUCOSE 142* 111*  BUN 11 11  CREATININE 0.72 0.80  CALCIUM 9.8 9.8   Liver Function Tests:   Recent Labs Lab 08/09/13 1630 08/11/13 0420  AST 14 14  ALT 16 15  ALKPHOS 69 59  BILITOT 0.6 0.6  PROT 7.5 6.6  ALBUMIN 4.1 3.7   CBC:   Recent Labs Lab 08/10/13 0848 08/11/13 0420  WBC 6.7 7.7  HGB 14.8 13.7  HCT 42.6 41.3  MCV 86.6 88.4  PLT 334 321   Coagulation:   Recent Labs Lab 08/09/13 1630  LABPROT 13.0  INR 0.98   Cardiac Enzymes:  Recent Labs Lab 08/09/13 1630 08/12/13 1145 08/12/13 1547  TROPONINI <0.30 <0.30 <0.30   Urinalysis:   Recent Labs Lab 08/09/13 1745  COLORURINE YELLOW  LABSPEC 1.010  PHURINE 6.0  GLUCOSEU NEGATIVE  HGBUR NEGATIVE  BILIRUBINUR NEGATIVE  KETONESUR NEGATIVE  PROTEINUR NEGATIVE  UROBILINOGEN 0.2  NITRITE NEGATIVE  LEUKOCYTESUR LARGE*   Lipid Panel    Component Value Date/Time   CHOL 208* 08/10/2013 0848   TRIG 135 08/10/2013 0848   HDL 53 08/10/2013 0848   CHOLHDL 3.9 08/10/2013 0848   VLDL 27 08/10/2013 0848   LDLCALC 128* 08/10/2013 0848   HgbA1C  Lab Results  Component Value Date   HGBA1C 5.8* 08/10/2013    Urine Drug Screen:     Component Value Date/Time   LABOPIA NONE DETECTED 08/09/2013 1745    LABOPIA PPS 04/30/2012 1533   COCAINSCRNUR NONE DETECTED 08/09/2013 1745   COCAINSCRNUR NEG 04/30/2012 1533   LABBENZ NONE DETECTED 08/09/2013 1745   LABBENZ NEG 04/30/2012 1533   AMPHETMU NONE DETECTED 08/09/2013 1745   THCU NONE DETECTED 08/09/2013 1745   LABBARB NONE DETECTED 08/09/2013 1745   LABBARB NEG 04/30/2012 1533    Alcohol Level: No results found for this basename: ETH,  in the last 168 hours      Dg Chest 2 View  08/09/2013   IMPRESSION: No active cardiopulmonary disease.     Ct Head Wo Contrast  08/09/2013   IMPRESSION: Stable chronic left MCA distribution infarction. No acute intracranial abnormality.     Ct Angio Head and neck W/cm &/or Wo Cm  08/09/2013  IMPRESSION: Negative study. No evidence of atherosclerotic narrowing or dissection. No vessel occlusion. No aneurysm or vascular malformation.  MRI/MRA  08/10/2013   IMPRESSION: Chronic left parietal infarct. Chronic microvascular ischemic change. Extension of acute infarct into the left parietal operculum, left insula, and left inferior parietal lobe, surrounding the area of chronic infarction.  Small irregular distal left vertebral artery. 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      Carotid Doppler CTA neck done  2D Echocardiogram  08/10/13 EF 55-60%, no cardiac source of emboli identified   Transesophageal Echocardiogram 08/12/13 Impression:  1) No source of embolus  2) Negative bubble study  3) Normal EF 60%  4) No significant valve disease  5) No LAA thrombus  6) No aortic debris  7) Normal RV  8) No effusion  EKG  08/09/13 Sinus rhythm RAE, consider biatrial enlargement Anteroseptal infarct, old For complete results please see formal report.   Therapy Recommendations: PT/OT recs CIR  Physical Exam Blood pressure 114/57, pulse 78, temperature 98.3 F (36.8 C), temperature source Oral, resp. rate 18, height 5\' 2"  (1.575 m), weight 78.7 kg (173  lb 8 oz), SpO2 100.00%. Gen: Patient is well developed, overweight older woman in no acute distress.  Cardiac: RRR. S1S2 audible.   Extremities: Cap refill <2 secs. No cyanosis or edema. Pulses 2+ radial and DP. Significant pain on touch in LUE Pulmonary: Respirations regular, symmetric. Lungs clear to auscultation bilat. Abd: Soft, non-tender. BS audible x 4 quadrants.  G/U: Deferred  MS: Alert, follows commands. Oriented to person, place, time, and event. Speech: Speech significant stuttering and mildly dysarthric, but able to name and repeat.  CN: No visual field cut. PERRL. EOMs intact. Facial sensation intact V1-3. No facial droop. Hearing grossly intact. Strong cough. Sternocleidomastoids and trapezius 5/5 strength. Tongue midline, full strength,  no atrophy or fasciculations.  Strength: 5/5 RUE, bilat LEs proximally and distally. LUE with weakness vs diminished effort due to pain.   Sensation: Intact to light touch in all four extremities. Hyperalgesia in LUE.  Coordination: No ataxia or dysmetria on FTN or HTS bilat. Did not assess gait or Romberg.  Reflexes: 1+ throughout but not able to test LUE due to pain  NIHSS 2-3  ASSESSMENT/PLAN Ms. Kara HammockSheila L Kia is a 63 y.o. female with prior hx of left MCA distribution infarct in 2008 with residual language impairment, hypertension, dyslipidemia, tobacco abuse, presenting with speech difficulty. Did not receive tPA due to unclear last known well. Imaging confirms chronic left parietal infarct with new extension into the left parietal operculum, left insula, and left inferior parietal lobe, surrounding the area of chronic infarction.  Chronic microvascular ischemic change. 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 Underlying etiology of infarct extension as yet undetermined. On clopidogrel 75 mg orally every day prior to admission. Now on clopidogrel 75  mg orally every day for secondary stroke prevention. Patient presented with stuttering speech and RUE pain and fluctuating weakness. Stroke work up so far negative except high LDL level. hypercoagulable work up so far unrevealing and TEE neg. Loop recorder inserted. Her stuttering is not associate with any neurological condition, likely due to recent stress. Left UE pain and hyperaglesia may consider RDS.  Stroke: Chronic L parietal infarct with new extension, etiology of new extension as yet undetermined, cryptogenic.   MRA and CTA do not show significant arterial stenosis  Echo shows normal EF, no source of emboli  TEE shows no source of emboli, normal EF, negative bubble  LDL 128, on simvastatin 20 mg daily at home, inc to 40 mg daily  HgbA1c 5.8, mildly elevated  TSH 3.4 WNL  PT/OT recs CIR  Ordered hypercoag workup, so far all negative.   Cryptogenic stroke, will need outpt follow up with Dr. Roda ShuttersXu in stroke clinic in about 2 months.  Hypertension  On HCTZ and losartan at home  Restarted losartan  BP 100s-150s/50s-80s  ? RDS - LUE Recommend DVT screening for LUE Continue lyrica Need outpt follow up with PCP and pain managment  Hospital day # 3  SIGNED Stephani Policeeirdre Fraller, MSN, ANP-C, Driscilla GrammesCNRN, MSCS [place] [facility] Team [phone] 08/11/2013 7:57 AM  I, the attending vascular neurologist, have personally obtained a history, examined the patient, evaluated laboratory data and imaging studies, and formulated the assessment and plan of care.  I have made any additions or clarifications directly to the above note and agree with the findings and plan as currently documented.   Marvel PlanJindong Lucette Kratz, MD PhD 08/12/2013 5:05 PM        To contact Stroke Continuity provider, please refer to [email or web address]. After hours, contact General Neurology

## 2013-08-12 NOTE — Progress Notes (Signed)
Agree with note. [id number] Donzetta Starch1TA, 1SC 08/12/2013 Martie RoundJulie Windie Marasco, OTR/L Pager: [phone]

## 2013-08-13 ENCOUNTER — Encounter (HOSPITAL_COMMUNITY): Payer: Self-pay | Admitting: Cardiovascular Disease

## 2013-08-13 DIAGNOSIS — G589 Mononeuropathy, unspecified: Secondary | ICD-10-CM

## 2013-08-13 DIAGNOSIS — M79609 Pain in unspecified limb: Secondary | ICD-10-CM

## 2013-08-13 DIAGNOSIS — I6340 Cerebral infarction due to embolism of unspecified cerebral artery: Principal | ICD-10-CM

## 2013-08-13 LAB — GLUCOSE, CAPILLARY
Glucose-Capillary: 134 mg/dL — ABNORMAL HIGH (ref 70–99)
Glucose-Capillary: 116 mg/dL — ABNORMAL HIGH (ref 70–99)
Glucose-Capillary: 120 mg/dL — ABNORMAL HIGH (ref 70–99)
Glucose-Capillary: 120 mg/dL — ABNORMAL HIGH (ref 70–99)

## 2013-08-13 NOTE — Progress Notes (Signed)
I have read and agree with this note.   Time in/out:15:28-15:51 Total time: 23 minutes (1SC,1TE)  Ignacia Palmaathy Hayven Fatima, OTR/L [phone]

## 2013-08-13 NOTE — Progress Notes (Signed)
Occupational Therapy Treatment Patient Details Name: Kara HammockSheila L Larrabee MRN: [id number] DOB: [birth year] Today's Date: 08/13/2013    History of present illness Admitted with slurred speech. PMHx left temporal parietal infarction back in 2008 at which time she presented with expressive aphasia, has history of hypertension, dyslipidemia, tobacco abuse as well. Pt with very painfull LUE    OT comments  Focus of today's session was on toileting, grooming and reviewing stress loading program. Pt was able to increase the amount of time on her stress loading program from 3 to 4 minutes and was instructed to repeat the program one more time today. Discharge plan of CIR remains appropriate. Will continue to follow.  Follow Up Recommendations  CIR    Equipment Recommendations  3 in 1 bedside comode       Precautions / Restrictions Precautions Precautions: Fall Precaution Comments: LUE pain       Mobility Bed Mobility Overal bed mobility: Needs Assistance Bed Mobility: Sit to Supine;Supine to Sit     Supine to sit: Supervision Sit to supine: Supervision   General bed mobility comments: min use of the rail  Transfers Overall transfer level: Needs assistance Equipment used: Straight cane Transfers: Sit to/from Stand Sit to Stand: Min guard         General transfer comment: initial unsteadiness on standing needing guarding    Balance Overall balance assessment: Needs assistance Sitting-balance support: No upper extremity supported;Feet supported Sitting balance-Leahy Scale: Good     Standing balance support: Single extremity supported;During functional activity Standing balance-Leahy Scale: Poor                     ADL Overall ADL's : Needs assistance/impaired     Grooming: Wash/dry hands;Min guard;Standing                   Toilet Transfer: Ambulation;Min guard;Regular Toilet;Grab bars   Toileting- Clothing Manipulation and Hygiene: Sit to/from stand;Min  guard       Functional mobility during ADLs: Cane;Min guard General ADL Comments: Reviewed stress loading program with pt and had her demonstrate understanding of it by putting weight through LUE and applying as much pressure as possible onto bath towel; then holding onto the soda can (7.4 oz) in L hand with elbow extended. Instructed pt to do both exercises for 4 minutes 4 times a day.                 Cognition   Behavior During Therapy: WFL for tasks assessed/performed Overall Cognitive Status: Within Functional Limits for tasks assessed                                Pertinent Vitals/ Pain       No c/o pain during tx but showed pain on her face while doing exercises. Pt was repositioned in bed.         Frequency Min 3X/week     Progress Toward Goals  OT Goals(current goals can now be found in the care plan section)  Progress towards OT goals: Progressing toward goals  Acute Rehab OT Goals Patient Stated Goal: be able to live by myself OT Goal Formulation: With patient Time For Goal Achievement: 08/19/13 Potential to Achieve Goals: Good  Plan Discharge plan remains appropriate       End of Session Equipment Utilized During Treatment:  (cane)   Activity Tolerance Patient tolerated treatment well   Patient  Left in bed;with call bell/phone within reach;with bed alarm set           Time:  -     Charges:    Maurene Capes 08/13/2013, 3:57 PM

## 2013-08-13 NOTE — Progress Notes (Signed)
UR COMPLETED  

## 2013-08-13 NOTE — Progress Notes (Signed)
Speech Language Pathology Treatment: Cognitive-Linquistic  Patient Details Name: Kara Mendoza MRN: [id number] DOB: 1950/05/11 Today's Date: 08/13/2013 Time: 1106-1130 SLP Time Calculation (min): 24 min  Assessment / Plan / Recommendation Clinical Impression  Skilled treatment session focused on addressing aphasia goals.  SLP facilitated session with structured description task and Min cues to utilize short phrases to describe nouns.  In unstructured tasks patient's verbal expression is sentence length utterances with dysfluencies and parahasias; however, with increased time patient is able to successfully self-correct errors.  SLP initiated education regarding new versus old deficits; patient reported that she just wanted her speech better, but son acknowledge that her speech is close to baseline function.  Patient would benefit from brief follow up for education regarding compensation given that deficits are premorbid at this point.       HPI HPI: Kara Mendoza is a 63 y.o. female with a past medical history of a left temporal parietal infarction back in 2008 at which time she presented with expressive aphasia, has history of hypertension, dyslipidemia, who presents to the emergency room with complaints of dysarthria. Per MD report, patient having difficulties getting her words out with ongoing dysarthria. Patient thinks that symptoms may have started 7/5  between 12 and 1:00 in the afternoon while at home, that came on with left upper extremity weakness. She lives alone, family members could not confirm exact time symptoms started. They called her this afternoon around 2 pm when they noted her speech impairment, having difficulties understanding her on the telephone. A CT scan of brain performed in the emergency room showed stable chronic left MCA distribution infarction without acute intracranial abnormality. MRI on 7/5 revealed chronic left parietal infarct, chronic microvascular ischemic change,  extension of acute infarct into the left parietal operculum, left insula, and left inferior parietal lobe, surrounding the area of chronic infarction. Small irregular distal left vertebral artery. 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 Speech eval ordered as part of stroke workup.   Pertinent Vitals none  SLP Plan  Continue with current plan of care    Recommendations                Follow up Recommendations: Inpatient Rehab Plan: Continue with current plan of care    GO    Kara FerrettiMelissa Keishana Mendoza, M.A., CCC-SLP [phone]  Kara Mendoza 08/13/2013, 11:41 AM

## 2013-08-13 NOTE — Progress Notes (Signed)
Troponins neg ECG normal  No further diagnostic testing necessary

## 2013-08-13 NOTE — Progress Notes (Signed)
Stroke Team Progress Note  HISTORY Kara Mendoza is an 63 y.o. female, right handed, with a past medical history relevant for left MCA distribution infarct back in 2008 with residual language impairment, hypertension, dyslipidemia, tobacco abuse, transferred to [facility] for further evaluation and management of dysarthria. She initially presented to [facility] 08/09/2013 because of difficulty expressing herself. She indicated that she had had speech trouble since her stroke in 2008, but yesterday 08/08/2013 around noon time she got very concerned because all of the sudden she couldn't get words out even knowing what she wanted to say, and also had left upper extremity weakness  and that problem has been present evert since. Denies associated HA, vertigo, double vision, difficulty swallowing, confusion, or vision impairment. No chest pain or palpitations. She lives alone and patient's daughter said that she called her yesterday around 2 pm and couldn't understand what she was trying to say. CT/CTA brain showed no acute abnormality. Takes plavix daily.  Patient c/o pain in the left hand states she has been recently diagnosed with carpel tunnel syndrome and has been taking percocet at home. Her Time last known well was unclear. She was not a tPA candidate secondary to delay in arrival. She was admitted for further evaluation.   SUBJECTIVE Patient's family at bedside.   OBJECTIVE Most recent Vital Signs: Filed Vitals:   08/13/13 0546 08/13/13 0926 08/13/13 1412 08/13/13 1732  BP: 137/64 139/62 124/69 130/64  Pulse: 66 76 78 77  Temp: 98.3 F (36.8 C) 98.9 F (37.2 C) 99 F (37.2 C) 99.1 F (37.3 C)  TempSrc: Oral Oral Oral Oral  Resp: 18 18 18 18   Height:      Weight:      SpO2: 96% 100% 100% 98%   CBG (last 3)   Recent Labs  08/13/13 0639 08/13/13 1123 08/13/13 1628  GLUCAP 134* 116* 120*    IV Fluid Intake:     MEDICATIONS  . clopidogrel  75 mg Oral Daily  . enoxaparin (LOVENOX) injection  40  mg Subcutaneous Q24H  . insulin aspart  0-15 Units Subcutaneous TID WC  . insulin aspart  0-5 Units Subcutaneous QHS  . losartan  50 mg Oral Daily  . methimazole  5 mg Oral Daily  . pregabalin  50 mg Oral TID  . simvastatin  40 mg Oral QHS   PRN:  oxyCODONE-acetaminophen, senna-docusate, zolpidem  Diet:  Carb Control thin liquids Activity:  As tolerated DVT Prophylaxis:  Lovenox  CLINICALLY SIGNIFICANT STUDIES Basic Metabolic Panel:   Recent Labs Lab 08/10/13 0848 08/11/13 0420  NA 141 142  K 3.5* 4.0  CL 101 103  CO2 26 26  GLUCOSE 142* 111*  BUN 11 11  CREATININE 0.72 0.80  CALCIUM 9.8 9.8   Liver Function Tests:   Recent Labs Lab 08/09/13 1630 08/11/13 0420  AST 14 14  ALT 16 15  ALKPHOS 69 59  BILITOT 0.6 0.6  PROT 7.5 6.6  ALBUMIN 4.1 3.7   CBC:   Recent Labs Lab 08/10/13 0848 08/11/13 0420  WBC 6.7 7.7  HGB 14.8 13.7  HCT 42.6 41.3  MCV 86.6 88.4  PLT 334 321   Coagulation:   Recent Labs Lab 08/09/13 1630  LABPROT 13.0  INR 0.98   Cardiac Enzymes:   Recent Labs Lab 08/12/13 1145 08/12/13 1547 08/12/13 2115  TROPONINI <0.30 <0.30 <0.30   Urinalysis:   Recent Labs Lab 08/09/13 1745  COLORURINE YELLOW  LABSPEC 1.010  PHURINE 6.0  GLUCOSEU NEGATIVE  HGBUR NEGATIVE  BILIRUBINUR NEGATIVE  KETONESUR NEGATIVE  PROTEINUR NEGATIVE  UROBILINOGEN 0.2  NITRITE NEGATIVE  LEUKOCYTESUR LARGE*   Lipid Panel    Component Value Date/Time   CHOL 208* 08/10/2013 0848   TRIG 135 08/10/2013 0848   HDL 53 08/10/2013 0848   CHOLHDL 3.9 08/10/2013 0848   VLDL 27 08/10/2013 0848   LDLCALC 128* 08/10/2013 0848   HgbA1C  Lab Results  Component Value Date   HGBA1C 5.8* 08/10/2013    Urine Drug Screen:     Component Value Date/Time   LABOPIA NONE DETECTED 08/09/2013 1745   LABOPIA PPS 04/30/2012 1533   COCAINSCRNUR NONE DETECTED 08/09/2013 1745   COCAINSCRNUR NEG 04/30/2012 1533   LABBENZ NONE DETECTED 08/09/2013 1745   LABBENZ NEG 04/30/2012 1533    AMPHETMU NONE DETECTED 08/09/2013 1745   THCU NONE DETECTED 08/09/2013 1745   LABBARB NONE DETECTED 08/09/2013 1745   LABBARB NEG 04/30/2012 1533    Alcohol Level: No results found for this basename: ETH,  in the last 168 hours      Dg Chest 2 View 08/09/2013   IMPRESSION: No active cardiopulmonary disease.     Ct Head Wo Contrast 08/09/2013   IMPRESSION: Stable chronic left MCA distribution infarction. No acute intracranial abnormality.     Ct Angio Head and neck W/cm &/or Wo Cm 08/09/2013  IMPRESSION: Negative study. No evidence of atherosclerotic narrowing or dissection. No vessel occlusion. No aneurysm or vascular malformation.  MRI/MRA 08/10/2013   IMPRESSION: Chronic left parietal infarct. Chronic microvascular ischemic change. Extension of acute infarct into the left parietal operculum, left insula, and left inferior parietal lobe, surrounding the area of chronic infarction.  Small irregular distal left vertebral artery. 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      Carotid Doppler CTA neck done  2D Echocardiogram  08/10/13 EF 55-60%, no cardiac source of emboli identified   LUE doppler There is no DVT or SVT noted in the left upper extremity.   Transesophageal Echocardiogram 08/12/13 Impression:  1) No source of embolus  2) Negative bubble study  3) Normal EF 60%  4) No significant valve disease  5) No LAA thrombus  6) No aortic debris  7) Normal RV  8) No effusion  EKG  08/09/13 Sinus rhythm RAE, consider biatrial enlargement Anteroseptal infarct, old For complete results please see formal report.   Therapy Recommendations: PT/OT recs CIR  Physical Exam Blood pressure 130/64, pulse 77, temperature 99.1 F (37.3 C), temperature source Oral, resp. rate 18, height 5\' 2"  (1.575 m), weight 78.7 kg (173 lb 8 oz), SpO2 98.00%. Gen: Patient is well developed, overweight older woman in no acute distress.  Cardiac:  RRR. S1S2 audible.   Extremities: Cap refill <2 secs. No cyanosis or edema. Pulses 2+ radial and DP. Significant pain on touch in LUE Pulmonary: Respirations regular, symmetric. Lungs clear to auscultation bilat. Abd: Soft, non-tender. BS audible x 4 quadrants.  G/U: Deferred  MS: Alert, follows commands. Oriented to person, place, time, and event. Speech: Speech significant stuttering and mildly dysarthric, but able to name and repeat.  CN: No visual field cut. PERRL. EOMs intact. Facial sensation intact V1-3. No facial droop. Hearing grossly intact. Strong cough. Sternocleidomastoids and trapezius 5/5 strength. Tongue midline, full strength, no atrophy or fasciculations.  Strength: 5/5 RUE, bilat LEs proximally and distally. LUE with weakness vs diminished effort due to pain.   Sensation:  Intact to light touch in all four extremities. Hyperalgesia in LUE.  Coordination: No ataxia or dysmetria on FTN or HTS bilat. Did not assess gait or Romberg.  Reflexes: 1+ throughout but not able to test LUE due to pain  NIHSS 2-3  ASSESSMENT [name] is a 63 y.o. female with prior hx of left MCA distribution infarct in 2008 with residual language impairment, hypertension, dyslipidemia, tobacco abuse, presenting with  stuttering speech and RUE pain and fluctuating weakness. Did not receive tPA due to unclear last known well. Imaging confirms chronic left parietal infarct with new extension into the left parietal operculum, left insula, and left inferior parietal lobe, surrounding the area of chronic infarction.  Chronic microvascular ischemic change. 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 Stroke etiology felt to be embolic, though no source found (cryptogenic).  Hypercoagulable work up so far unrevealing and TEE neg. Loop recorder inserted.  On clopidogrel 75 mg orally every day prior to admission. Now  on clopidogrel 75 mg orally every day for secondary stroke prevention. Patient with resultant stuttering speech, which is not associate with any neurological condition, likely due to recent stress. Left UE pain and hyperaglesia  consider RDS (on lyrica).  Hypertension, On HCTZ and losartan at home  Hyperlipidemia, LDL 128, on simvastatin 20 mg daily at home, inc to 40 mg daily, goal LDL < 100   Cardiology: no further workup indicated. troponins normal  PLAN   Plavix for secondary stroke prevention  Statin at discharge  Dispo:  CIR. They are following for admission  Need outpt follow up with PCP and pain managment  Stroke service will sign off  Outpt follow up with Dr. Roda Shutters in stroke clinic in about 2 months.  Hospital day # 4  SIGNED Annie Main, MSN, RN, ANVP-BC, ANP-BC, [facility] Pager: [phone] 08/13/2013 7:05 PM   I, the attending vascular neurologist, have personally obtained a history, examined the patient, evaluated laboratory data and imaging studies, and formulated the assessment and plan of care.  I have made any additions or clarifications directly to the above note and agree with the findings and plan as currently documented.   [name], MD PhD 08/13/2013 6:52 PM        To contact Stroke Continuity provider, please refer to [email or web address]. After hours, contact General Neurology

## 2013-08-13 NOTE — Progress Notes (Signed)
VASCULAR LAB PRELIMINARY  PRELIMINARY  PRELIMINARY  PRELIMINARY  Left upper extremity venous Doppler completed.    Preliminary report:  There is no DVT or SVT noted in the left upper extremity.   Mirakle Tomlin, RVT 08/13/2013, 1:39 PM

## 2013-08-13 NOTE — Progress Notes (Signed)
Rehab admissions - I spoke with patient, case manager and Dr. Suanne MarkerViyuoh this am.  I am not sure if myoview is to be done today or not.  I would like all tests and procedures completed prior to considering inpatient rehab admission.  I await follow up today regarding possible myoview exam for today.  Call me for questions.  #[phone]

## 2013-08-13 NOTE — Progress Notes (Addendum)
[facility] PROGRESS NOTE  Kara Mendoza:[id number] DOB: 1950-06-12 DOA: 08/09/2013 PCP: Dorrene German, MD Brief Narrative: Kara Mendoza is a 63 y.o. female with a history of a left temporal parietal infarction back in 2008 at which time she presented with expressive aphasia, has history of hypertension, dyslipidemia, tobacco abuse as well, presented to the ER with complaints of dysarthria and worsening LUE numbness. Stroke workup underway, slight acute changes on MRI in L pareital lobe, NEuro following, continued on Plavix/statin therapy. Plan for TEE tomorrow and CIR possibly for rehab  Assessment/Plan: 1. Chronic L Parietal infarct with extension of Acute infarct in L parietal lobe - continue plavix/statin - Neuro following, per Dr.Xu MRI doesn't quite explain stuttering, stress could be contributing -Stuttering likely stress induced per Neuro - 2D ECHO EF 55% no cardiac source of thrombus - Carotid duplex: normal CTA  - Pt/OT/St following CIR recommended -TEE neg,and Loop recorder implanted -LDL 128, continue statin, hbaic 5.8 -await CIR follow up decision  2. Hypertension.  - resume Losartan at low dose, hold HCTZ  3. Dyslipidemia.  -continue statin   4. Type 2 diabetes mellitus -hold metformin, SSI -hbaic 5.8  5. Hyperthyroidism.  -Will continue Methimazole, TSH normal  6. Suspected RSD LUE: -PT, percocet PRN -obtain US neg for DVT  7. Anxiety with home/rent etc -children trying to sort this issue 8. Chest pain -Trops neg, pain resolved -discussed pt with Dr Graciela Husbands this pm and he recommends no further testing   DVT prophylaxis. Lovenox  Code Status: Full Code Family Communication: none at bedside Disposition Plan: to CIR in am  Procedures TEE 7/8- per Dr Eden Emms Impression:  1) No source of embolus  2) Negative bubble study  3) Normal EF 60%  4) No significant valve disease  5) No LAA thrombus  6) No aortic debris  7) Normal RV  8) No  effusion   7/8 ILR- per Dr Graciela Husbands  Consultants:  Neuro  HPI/Subjective: Less stuttering today,still with LUE pain  Objective: Filed Vitals:   08/13/13 1412  BP: 124/69  Pulse: 78  Temp: 99 F (37.2 C)  Resp: 18    Intake/Output Summary (Last 24 hours) at 08/13/13 1629 Last data filed at 08/13/13 1317  Gross per 24 hour  Intake    840 ml  Output      0 ml  Net    840 ml   Filed Weights   08/09/13 2030  Weight: 78.7 kg (173 lb 8 oz)    Exam:   General:  AAOx3, no distress  Cardiovascular: S1S2/RRR  Respiratory: CTAB  Abdomen: soft, NT, BS present  Musculoskeletal: no edema c/c  Neuro: dysarthric, motor 5/5, sensory light touch intact, DTR 2plus,    Data Reviewed: Basic Metabolic Panel:  Recent Labs Lab 08/09/13 1630 08/10/13 0848 08/11/13 0420  NA 140 141 142  K 4.1 3.5* 4.0  CL 101 101 103  CO2 26 26 26   GLUCOSE 109* 142* 111*  BUN 14 11 11   CREATININE 0.80 0.72 0.80  CALCIUM 10.2 9.8 9.8   Liver Function Tests:  Recent Labs Lab 08/09/13 1630 08/11/13 0420  AST 14 14  ALT 16 15  ALKPHOS 69 59  BILITOT 0.6 0.6  PROT 7.5 6.6  ALBUMIN 4.1 3.7   No results found for this basename: LIPASE, AMYLASE,  in the last 168 hours No results found for this basename: AMMONIA,  in the last 168 hours CBC:  Recent Labs Lab 08/09/13 1630 08/10/13 0848  08/11/13 0420  WBC 9.6 6.7 7.7  HGB 14.9 14.8 13.7  HCT 43.3 42.6 41.3  MCV 86.4 86.6 88.4  PLT 343 334 321   Cardiac Enzymes:  Recent Labs Lab 08/09/13 1630 08/12/13 1145 08/12/13 1547 08/12/13 2115  TROPONINI <0.30 <0.30 <0.30 <0.30   BNP (last 3 results) No results found for this basename: PROBNP,  in the last 8760 hours CBG:  Recent Labs Lab 08/12/13 1205 08/12/13 1634 08/12/13 2130 08/13/13 0639 08/13/13 1123  GLUCAP 103* 112* 99 134* 116*    Recent Results (from the past 240 hour(s))  MRSA PCR SCREENING     Status: None   Collection Time    08/10/13  1:36 AM       Result Value Ref Range Status   MRSA by PCR NEGATIVE  NEGATIVE Final   Comment:            The GeneXpert MRSA Assay (FDA     approved for NASAL specimens     only), is one component of a     comprehensive MRSA colonization     surveillance program. It is not     intended to diagnose MRSA     infection nor to guide or     monitor treatment for     MRSA infections.     Studies: No results found.  Scheduled Meds: . clopidogrel  75 mg Oral Daily  . enoxaparin (LOVENOX) injection  40 mg Subcutaneous Q24H  . insulin aspart  0-15 Units Subcutaneous TID WC  . insulin aspart  0-5 Units Subcutaneous QHS  . losartan  50 mg Oral Daily  . methimazole  5 mg Oral Daily  . pregabalin  50 mg Oral TID  . simvastatin  40 mg Oral QHS   Continuous Infusions:  Antibiotics Given (last 72 hours)   None      Principal Problem:   CVA (cerebral infarction) Active Problems:   GERD (gastroesophageal reflux disease)   Hyperlipidemia   Hypertension   Dysarthria    Time spent: 30min    [name] C  [facility] Pager [phone]. If 7PM-7AM, please contact night-coverage at [email or web address], password [email or web address] 08/13/2013, 4:29 PM  LOS: 4 days

## 2013-08-14 ENCOUNTER — Inpatient Hospital Stay (HOSPITAL_COMMUNITY)
Admission: RE | Admit: 2013-08-14 | Discharge: 2013-08-22 | DRG: 945 | Disposition: A | Discharge status: Home Health | Payer: Medicare HMO | Source: Intra-hospital | Attending: Physical Medicine & Rehabilitation | Admitting: Physical Medicine & Rehabilitation

## 2013-08-14 DIAGNOSIS — R471 Dysarthria and anarthria: Secondary | ICD-10-CM | POA: Diagnosis present

## 2013-08-14 DIAGNOSIS — R4182 Altered mental status, unspecified: Secondary | ICD-10-CM | POA: Diagnosis present

## 2013-08-14 DIAGNOSIS — E1149 Type 2 diabetes mellitus with other diabetic neurological complication: Secondary | ICD-10-CM | POA: Diagnosis present

## 2013-08-14 DIAGNOSIS — E0590 Thyrotoxicosis, unspecified without thyrotoxic crisis or storm: Secondary | ICD-10-CM | POA: Diagnosis present

## 2013-08-14 DIAGNOSIS — Z7902 Long term (current) use of antithrombotics/antiplatelets: Secondary | ICD-10-CM

## 2013-08-14 DIAGNOSIS — Z5189 Encounter for other specified aftercare: Principal | ICD-10-CM

## 2013-08-14 DIAGNOSIS — R4701 Aphasia: Secondary | ICD-10-CM | POA: Diagnosis present

## 2013-08-14 DIAGNOSIS — K5900 Constipation, unspecified: Secondary | ICD-10-CM | POA: Diagnosis present

## 2013-08-14 DIAGNOSIS — I639 Cerebral infarction, unspecified: Secondary | ICD-10-CM | POA: Diagnosis present

## 2013-08-14 DIAGNOSIS — E1142 Type 2 diabetes mellitus with diabetic polyneuropathy: Secondary | ICD-10-CM | POA: Diagnosis present

## 2013-08-14 DIAGNOSIS — E785 Hyperlipidemia, unspecified: Secondary | ICD-10-CM | POA: Diagnosis present

## 2013-08-14 DIAGNOSIS — I10 Essential (primary) hypertension: Secondary | ICD-10-CM | POA: Diagnosis present

## 2013-08-14 DIAGNOSIS — M79609 Pain in unspecified limb: Secondary | ICD-10-CM | POA: Diagnosis present

## 2013-08-14 DIAGNOSIS — R269 Unspecified abnormalities of gait and mobility: Secondary | ICD-10-CM

## 2013-08-14 DIAGNOSIS — I6340 Cerebral infarction due to embolism of unspecified cerebral artery: Secondary | ICD-10-CM

## 2013-08-14 DIAGNOSIS — J45998 Other asthma: Secondary | ICD-10-CM

## 2013-08-14 DIAGNOSIS — I6330 Cerebral infarction due to thrombosis of unspecified cerebral artery: Secondary | ICD-10-CM

## 2013-08-14 DIAGNOSIS — F411 Generalized anxiety disorder: Secondary | ICD-10-CM | POA: Diagnosis present

## 2013-08-14 DIAGNOSIS — G90519 Complex regional pain syndrome I of unspecified upper limb: Secondary | ICD-10-CM

## 2013-08-14 DIAGNOSIS — I6350 Cerebral infarction due to unspecified occlusion or stenosis of unspecified cerebral artery: Secondary | ICD-10-CM | POA: Diagnosis present

## 2013-08-14 LAB — BASIC METABOLIC PANEL
Anion gap: 13 (ref 5–15)
BUN: 13 mg/dL (ref 6–23)
CO2: 28 mEq/L (ref 19–32)
Calcium: 10 mg/dL (ref 8.4–10.5)
Chloride: 99 mEq/L (ref 96–112)
Creatinine, Ser: 0.75 mg/dL (ref 0.50–1.10)
GFR calc Af Amer: >90 mL/min (ref >90)
GFR calc non Af Amer: 88 mL/min — ABNORMAL LOW (ref >90)
Glucose, Bld: 137 mg/dL — ABNORMAL HIGH (ref 70–99)
Potassium: 4.9 mEq/L (ref 3.7–5.3)
Sodium: 140 mEq/L (ref 137–147)

## 2013-08-14 LAB — GLUCOSE, CAPILLARY
Glucose-Capillary: 119 mg/dL — ABNORMAL HIGH (ref 70–99)
Glucose-Capillary: 108 mg/dL — ABNORMAL HIGH (ref 70–99)
Glucose-Capillary: 89 mg/dL (ref 70–99)
Glucose-Capillary: 148 mg/dL — ABNORMAL HIGH (ref 70–99)

## 2013-08-14 LAB — MAGNESIUM: Magnesium: 2 mg/dL (ref 1.5–2.5)

## 2013-08-14 MED ORDER — INSULIN ASPART 100 UNIT/ML ~~LOC~~ SOLN: 0.0000 [IU] | Freq: Every day | SUBCUTANEOUS | Status: DC

## 2013-08-14 MED ORDER — SIMVASTATIN 40 MG PO TABS: 40.0000 mg | ORAL_TABLET | Freq: Every day | ORAL | Status: DC

## 2013-08-14 MED ORDER — INSULIN ASPART 100 UNIT/ML ~~LOC~~ SOLN: 0.0000 [IU] | Freq: Three times a day (TID) | SUBCUTANEOUS | Status: DC

## 2013-08-14 NOTE — Progress Notes (Signed)
Rehab admissions - [facility] for today.  Bed available, patient agreeable, and will admit today.  Call me for questions.  #[phone]

## 2013-08-14 NOTE — Progress Notes (Signed)
Patient admitted to the unit. Skin intact, no breakdown. Patient reviewed the safety plan and agreed. Left arm painful, guarded to touch. Family greeted and admission complete

## 2013-08-14 NOTE — H&P (View-Only) (Signed)
Physical Medicine and Rehabilitation Admission H&P    Chief Complaint  Patient presents with  . Aphasia  . Altered Mental Status  : HPI: Kara Mendoza is a 63 y.o. 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 Patient independent with a cane and walker prior to admission living alone with a home health aide 2 hours daily. Admitted 08/09/2013 with increased aphasia. MRI of the brain showed extension of acute infarcts left parietal operculum, left insula and inferior parietal lobes. MRA of the head without occlusion.  CTA of head and neck negative. Echocardiogram with ejection fraction of 60% no wall motion abnormalities. Patient did not receive TPA. Neurology followup currently continues on Plavix for CVA prophylaxis as well as subcutaneous Lovenox for DVT prophylaxis. TEE completed that showed no PFO without thrombus and underwent loop recorder implantation 08/12/2013 per Dr. Caryl Comes. Physical therapy evaluation 08/10/2013 with recommendations of physical medicine rehabilitation consult. Patient was admitted for comprehensive rehabilitation program   ROS Review of Systems  Constitutional: Positive for malaise/fatigue.  Gastrointestinal: Positive for constipation.  Musculoskeletal:  Chronic left arm pain  Neurological: Positive for speech change, weakness and headaches.  Psychiatric/Behavioral: Positive for depression.  Anxiety  All other systems reviewed and are negative  Past Medical History  Diagnosis Date  . Stroke   . Neuromuscular disorder   . Hypertension   . Hyperlipidemia   . Asthma   . Diabetes mellitus   . Hyperthyroidism   . Esophageal reflux   . Constipation   . Major depressive disorder, recurrent episode, moderate   . Anxiety state, unspecified   . History of fall   . Abnormality of  gait   . Chronic pain syndrome   . Tobacco use disorder   . Osteoarthritis   . Dysarthria   . Acute bronchitis   . Chronic arm pain 2008    left  . Chronic leg pain 2008    left  . Fatigue   . Chronic headache    Past Surgical History  Procedure Laterality Date  . Cesarean section      x3  . Finger surgery     Family History  Problem Relation Age of Onset  . Diabetes Sister   . Cancer Father   . Diabetes Brother    Social History:  reports that she has been smoking Cigarettes.  She has a 3 pack-year smoking history. She has never used smokeless tobacco. She reports that she does not drink alcohol or use illicit drugs. Allergies:  Allergies  Allergen Reactions  . Aspirin Other (See Comments)    Possibly itching and "feeling little weird"  . Penicillins Hives   Medications Prior to Admission  Medication Sig Dispense Refill  . albuterol (PROVENTIL HFA;VENTOLIN HFA) 108 (90 BASE) MCG/ACT inhaler Inhale 3 puffs into the lungs every 6 (six) hours as needed for wheezing or shortness of breath.       . buprenorphine (BUTRANS) 10 MCG/HR PTWK patch Place 10 mcg onto the skin once a week.      . meloxicam (MOBIC) 7.5 MG tablet Take 7.5 mg by mouth daily.      . metFORMIN (GLUCOPHAGE) 500 MG tablet Take 500 mg by mouth daily.      Marland Kitchen oxyCODONE-acetaminophen (PERCOCET) 10-325 MG per tablet Take 1 tablet by mouth every 6 (six) hours as needed. For pain      .  oxyCODONE-acetaminophen (PERCOCET/ROXICET) 5-325 MG per tablet Take 1 tablet by mouth every 6 (six) hours as needed for severe pain.      Marland Kitchen sertraline (ZOLOFT) 50 MG tablet Take 1 tablet by mouth at bedtime.      . Tapentadol HCl (NUCYNTA) 100 MG TABS Take 50 mg by mouth 2 (two) times daily.      . clopidogrel (PLAVIX) 75 MG tablet Take 1 tablet by mouth daily.      . diclofenac (VOLTAREN) 75 MG EC tablet Take 75 mg by mouth 2 (two) times daily.      . diclofenac sodium (VOLTAREN) 1 % GEL Apply 4 g topically 4 (four) times daily as  needed.      . hydrochlorothiazide (HYDRODIURIL) 50 MG tablet Take 50 mg by mouth daily.      Marland Kitchen losartan (COZAAR) 100 MG tablet Take 1 tablet by mouth daily.      Marland Kitchen losartan-hydrochlorothiazide (HYZAAR) 100-25 MG per tablet Take 1 tablet by mouth daily.      . methimazole (TAPAZOLE) 5 MG tablet Take 5 mg by mouth daily.      . Multiple Vitamins-Calcium (ONE-A-DAY WOMENS FORMULA PO) Take 1 tablet by mouth daily.      Marland Kitchen omeprazole (PRILOSEC) 20 MG capsule Take 20 mg by mouth 2 (two) times daily.      . pregabalin (LYRICA) 50 MG capsule Take 50 mg by mouth 3 (three) times daily.      . sennosides-docusate sodium (SENOKOT-S) 8.6-50 MG tablet Take 1 tablet by mouth daily.      . simvastatin (ZOCOR) 20 MG tablet Take 10 mg by mouth at bedtime. 1/2 tab qd      . zolpidem (AMBIEN) 5 MG tablet Take 5 mg by mouth at bedtime.        Home: Home Living Family/patient expects to be discharged to:: Private residence Living Arrangements: Alone Available Help at Discharge: Family;Personal care attendant Type of Home: House Home Access: Stairs to enter [employer] of Steps: 6 Entrance Stairs-Rails: Right Home Layout: One [place]: [place] - 2 wheels;Cane - single point   Functional History: Prior Function Level of Independence: Needs assistance Gait / Transfers Assistance Needed: uses cane, but has not previously needed physical support ADL's / Homemaking Assistance Needed: Nurse aide assistance 2 hours a day to help with B/D since about 9 months ago when she started having pain in her LUE (which has continued to get worse), up until then the aid mainly helped her around the house for IADLs  Functional Status:  Mobility: Bed Mobility Overal bed mobility: Needs Assistance Bed Mobility: Sit to Supine Supine to sit: Min guard Sit to supine: Supervision General bed mobility comments: moderate use of the rail Transfers Overall transfer level: Needs assistance Equipment used:  Straight cane Transfers: Sit to/from Stand Sit to Stand: Min guard General transfer comment: slow and guarded, but guard for safety only. Ambulation/Gait Ambulation/Gait assistance: Min guard Ambulation Distance (Feet): 600 Feet Assistive device: Straight cane Gait Pattern/deviations: Step-through pattern Gait velocity: slow speed preferred, but able to noticeably increase speed. Gait velocity interpretation: Below normal speed for age/gender General Gait Details: generally steady with episodes of instability with sudden turns, scanning Stairs: Yes Stairs assistance: Min guard Stair Management: Step to pattern;Forwards;With cane Number of Stairs: 6 General stair comments: steady with cane    ADL: ADL Overall ADL's : Needs assistance/impaired Eating/Feeding: Set up;Sitting Grooming: Sitting;Wash/dry hands;Min guard Upper Body Bathing: Minimal assitance;Sitting Lower Body Bathing: Moderate assistance;Sit  to/from stand Upper Body Dressing : Moderate assistance;Sitting Lower Body Dressing: Maximal assistance;Sit to/from stand Toilet Transfer: Ambulation;Min guard Toileting- Water quality scientist and Hygiene: Sit to/from stand;Min guard Functional mobility during ADLs: Cane;Min guard General ADL Comments: Pt able to ambulate to/from the bathroom with min guard A for safety. Pt used LUE to push up from toilet using the grab bars and stood at the sink for hand hygiene. Educated pt on stress loading program for CRPS and had her put weight through LUE pushing onto bath towel and apply as much pressure as possible using a back and forth motion for 3 minutes; then had pt hold a lotion bottle with L hand with elbow extended for 3 minutes. Instructed pt to repeat this process one more time today.  Cognition: Cognition Overall Cognitive Status: Within Functional Limits for tasks assessed Arousal/Alertness: Awake/alert Orientation Level: Oriented X4 Attention: Selective Selective Attention:  Appears intact Memory: Appears intact Awareness: Appears intact Problem Solving: Appears intact Safety/Judgment: Appears intact Cognition Arousal/Alertness: Awake/alert Behavior During Therapy: WFL for tasks assessed/performed Overall Cognitive Status: Within Functional Limits for tasks assessed   Physical Exam: Blood pressure 113/55, pulse 64, temperature 98.3 F (36.8 C), temperature source Oral, resp. rate 18, height _0  (1.575 m), weight 78.7 kg (173 lb 8 oz), SpO2 97.00%. Physical Exam General: no distress.  Eyes: EOM are normal.  Mouth: oral mucosa pink/moist Neck: Normal range of motion. Neck supple. No thyromegaly present.  Cardiovascular: Normal rate and regular rhythm. No murmurs Respiratory: Effort normal and breath sounds normal. No respiratory distress. No wheezes GI: Soft. Bowel sounds are normal. She exhibits no distension.  Musculoskeletal:  Noted hypersensitivity to left forearm to palpation she is quite guarded  Neurological: She is alert and oriented x 3. Reasonable insight and awareness. Follows all simple commands. Stuttering speech is better. LUE grossly 2+/5 with pain still an issue. LLE is 3 to 4-/5 prox to distal. Sensation 1/2 LUE with dyesthesisas still present. Distal sensory loss to LT in both legs below the knees. Speech dysarthric. Left facial weakness.  Psych: anxiety improved, pleasant and cooperative.  Results for orders placed during the hospital encounter of 08/09/13 (from the past 48 hour(s))  GLUCOSE, CAPILLARY     Status: Abnormal   Collection Time    08/10/13  8:11 AM      Result Value Ref Range   Glucose-Capillary 106 (*) 70 - 99 mg/dL  HEMOGLOBIN A1C     Status: Abnormal   Collection Time    08/10/13  8:48 AM      Result Value Ref Range   Hemoglobin A1C 5.8 (*) <5.7 %   Comment: (NOTE)                                                                               According to the ADA Clinical Practice Recommendations for 2011, when       HbA1c is used as a screening test:      >=6.5%   Diagnostic of Diabetes Mellitus               (if abnormal result is confirmed)     5.7-6.4%   Increased risk of developing Diabetes Mellitus  References:Diagnosis and Classification of Diabetes Mellitus,Diabetes     EOFH,2197,58(ITGPQ 1):S62-S69 and Standards of Medical Care in             Diabetes - 2011,Diabetes DIYM,4158,30 (Suppl 1):S11-S61.   Mean Plasma Glucose 120 (*) <117 mg/dL   Comment: Performed at [facility]  CBC     Status: None   Collection Time    08/10/13  8:48 AM      Result Value Ref Range   WBC 6.7  4.0 - 10.5 K/uL   RBC 4.92  3.87 - 5.11 MIL/uL   Hemoglobin 14.8  12.0 - 15.0 g/dL   HCT 42.6  36.0 - 46.0 %   MCV 86.6  78.0 - 100.0 fL   MCH 30.1  26.0 - 34.0 pg   MCHC 34.7  30.0 - 36.0 g/dL   RDW 12.7  11.5 - 15.5 %   Platelets 334  150 - 400 K/uL  BASIC METABOLIC PANEL     Status: Abnormal   Collection Time    08/10/13  8:48 AM      Result Value Ref Range   Sodium 141  137 - 147 mEq/L   Potassium 3.5 (*) 3.7 - 5.3 mEq/L   Chloride 101  96 - 112 mEq/L   CO2 26  19 - 32 mEq/L   Glucose, Bld 142 (*) 70 - 99 mg/dL   BUN 11  6 - 23 mg/dL   Creatinine, Ser 0.72  0.50 - 1.10 mg/dL   Calcium 9.8  8.4 - 10.5 mg/dL   GFR calc non Af Amer 89 (*) >90 mL/min   GFR calc Af Amer >90  >90 mL/min   Comment: (NOTE)     The eGFR has been calculated using the CKD EPI equation.     This calculation has not been validated in all clinical situations.     eGFR's persistently <90 mL/min signify possible Chronic Kidney     Disease.   Anion gap 14  5 - 15  LIPID PANEL     Status: Abnormal   Collection Time    08/10/13  8:48 AM      Result Value Ref Range   Cholesterol 208 (*) 0 - 200 mg/dL   Triglycerides 135  <150 mg/dL   HDL 53  >39 mg/dL   Total CHOL/HDL Ratio 3.9     VLDL 27  0 - 40 mg/dL   LDL Cholesterol 128 (*) 0 - 99 mg/dL   Comment:            Total Cholesterol/HDL:CHD Risk     Coronary Heart  Disease Risk Table                         Men   Women      1/2 Average Risk   3.4   3.3      Average Risk       5.0   4.4      2 X Average Risk   9.6   7.1      3 X Average Risk  23.4   11.0                Use the calculated Patient Ratio     above and the CHD Risk Table     to determine the patient's CHD Risk.                ATP III CLASSIFICATION (LDL):      <  100     mg/dL   Optimal      100-129  mg/dL   Near or Above                        Optimal      130-159  mg/dL   Borderline      160-189  mg/dL   High      >190     mg/dL   Very High  GLUCOSE, CAPILLARY     Status: Abnormal   Collection Time    08/10/13 11:57 AM      Result Value Ref Range   Glucose-Capillary 105 (*) 70 - 99 mg/dL  GLUCOSE, CAPILLARY     Status: None   Collection Time    08/10/13  4:41 PM      Result Value Ref Range   Glucose-Capillary 93  70 - 99 mg/dL  GLUCOSE, CAPILLARY     Status: Abnormal   Collection Time    08/10/13  9:33 PM      Result Value Ref Range   Glucose-Capillary 161 (*) 70 - 99 mg/dL   Comment 1 Notify RN     Comment 2 Documented in Chart    CBC     Status: None   Collection Time    08/11/13  4:20 AM      Result Value Ref Range   WBC 7.7  4.0 - 10.5 K/uL   RBC 4.67  3.87 - 5.11 MIL/uL   Hemoglobin 13.7  12.0 - 15.0 g/dL   HCT 41.3  36.0 - 46.0 %   MCV 88.4  78.0 - 100.0 fL   MCH 29.3  26.0 - 34.0 pg   MCHC 33.2  30.0 - 36.0 g/dL   RDW 12.8  11.5 - 15.5 %   Platelets 321  150 - 400 K/uL  BASIC METABOLIC PANEL     Status: Abnormal   Collection Time    08/11/13  4:20 AM      Result Value Ref Range   Sodium 142  137 - 147 mEq/L   Potassium 4.0  3.7 - 5.3 mEq/L   Chloride 103  96 - 112 mEq/L   CO2 26  19 - 32 mEq/L   Glucose, Bld 111 (*) 70 - 99 mg/dL   BUN 11  6 - 23 mg/dL   Creatinine, Ser 0.80  0.50 - 1.10 mg/dL   Calcium 9.8  8.4 - 10.5 mg/dL   GFR calc non Af Amer 76 (*) >90 mL/min   GFR calc Af Amer 88 (*) >90 mL/min   Comment: (NOTE)     The eGFR has been  calculated using the CKD EPI equation.     This calculation has not been validated in all clinical situations.     eGFR's persistently <90 mL/min signify possible Chronic Kidney     Disease.   Anion gap 13  5 - 15  VITAMIN B12     Status: Abnormal   Collection Time    08/11/13  4:20 AM      Result Value Ref Range   Vitamin B-12 1016 (*) 211 - 911 pg/mL   Comment: Performed at [facility]     Status: None   Collection Time    08/11/13  4:20 AM      Result Value Ref Range   Folate >20.0     Comment: (NOTE)     Reference Ranges  Deficient:       0.4 - 3.3 ng/mL            Indeterminate:   3.4 - 5.4 ng/mL            Normal:              > 5.4 ng/mL     Performed at [facility]     Status: None   Collection Time    08/11/13  4:20 AM      Result Value Ref Range   Total Protein 6.6  6.0 - 8.3 g/dL   Albumin 3.7  3.5 - 5.2 g/dL   AST 14  0 - 37 U/L   ALT 15  0 - 35 U/L   Alkaline Phosphatase 59  39 - 117 U/L   Total Bilirubin 0.6  0.3 - 1.2 mg/dL   Bilirubin, Direct <0.2  0.0 - 0.3 mg/dL   Indirect Bilirubin NOT CALCULATED  0.3 - 0.9 mg/dL  GLUCOSE, CAPILLARY     Status: Abnormal   Collection Time    08/11/13  7:05 AM      Result Value Ref Range   Glucose-Capillary 109 (*) 70 - 99 mg/dL   Comment 1 Notify RN     Comment 2 Documented in Chart    RPR     Status: None   Collection Time    08/11/13 11:10 AM      Result Value Ref Range   RPR NON REAC  NON REAC   Comment: Performed at [facility]  HIV ANTIBODY (ROUTINE TESTING)     Status: None   Collection Time    08/11/13 11:10 AM      Result Value Ref Range   HIV 1&2 Ab, 4th Generation NONREACTIVE  NONREACTIVE   Comment: (NOTE)     A NONREACTIVE HIV Ag/Ab result does not exclude HIV infection since     the time frame for seroconversion is variable. If acute HIV infection     is suspected, a HIV-1 RNA Qualitative TMA test is recommended.     HIV-1/2  Antibody Diff         Not indicated.     HIV-1 RNA, Qual TMA           Not indicated.     PLEASE NOTE: This information has been disclosed to you from records     whose confidentiality may be protected by state law. If your state     requires such protection, then the state law prohibits you from making     any further disclosure of the information without the specific written     consent of the person to whom it pertains, or as otherwise permitted     by law. A general authorization for the release of medical or other     information is NOT sufficient for this purpose.     The performance of this assay has not been clinically validated in     patients less than 87 years old.     Performed at [facility]     Status: None   Collection Time    08/11/13 11:10 AM      Result Value Ref Range   Sed Rate 3  0 - 22 mm/hr  HIGH SENSITIVITY CRP     Status: None   Collection Time    08/11/13 11:10 AM      Result Value Ref  Range   CRP, High Sensitivity 1.3     Comment: (NOTE)              <1.0 mg/L              Low risk              1.0 to 3.0 mg/L        Average risk              >3.0 mg/L              High risk     If a level of >10.0 mg/L is seen, an obvious source of infection or     inflammation should be sought. If found, this value should be     disregarded.  Suggest repeat testing after resolution of infection     and/or inflammation.     Performed at [place]     Status: None   Collection Time    08/11/13 11:10 AM      Result Value Ref Range   C3 Complement 153  90 - 180 mg/dL   Comment: Performed at [facility]     Status: None   Collection Time    08/11/13 11:10 AM      Result Value Ref Range   Complement C4, Body Fluid 28  10 - 40 mg/dL   Comment: Performed at [place] [facility]     Status: None   Collection Time    08/11/13 11:10 AM      Result Value Ref Range   Homocysteine  6.5  4.0 - 15.4 umol/L   Comment: Performed at [facility]     Status: None   Collection Time    08/11/13 11:10 AM      Result Value Ref Range   Rheumatoid Factor <10  <=14 IU/mL   Comment: (NOTE)                             Interpretive Table                        Low Positive: 15 - 41 IU/mL                        High Positive:  >= 42 IU/mL     In addition to the RF result, and clinical symptoms including joint     involvement, the 2010 ACR Classification Criteria for     scoring/diagnosing Rheumatoid Arthritis include the results of the     following tests:  CRP (62947), ESR (15010), and CCP (APCA) (65465).     www.rheumatology.org/practice/clinical/classification/ra/ra_2010.asp     Performed at [place], [facility]     Status: Abnormal   Collection Time    08/11/13 11:32 AM      Result Value Ref Range   Glucose-Capillary 110 (*) 70 - 99 mg/dL  GLUCOSE, CAPILLARY     Status: Abnormal   Collection Time    08/11/13  4:21 PM      Result Value Ref Range   Glucose-Capillary 114 (*) 70 - 99 mg/dL  GLUCOSE, CAPILLARY     Status: Abnormal   Collection Time    08/11/13  9:51 PM      Result Value Ref Range   Glucose-Capillary  125 (*) 70 - 99 mg/dL   Mr Brain Wo Contrast  08/10/2013   CLINICAL DATA:  Stroke  EXAM: MRI HEAD WITHOUT CONTRAST  MRA HEAD WITHOUT CONTRAST  TECHNIQUE: Multiplanar, multiecho pulse sequences of the brain and surrounding structures were obtained without intravenous contrast. Angiographic images of the head were obtained using MRA technique without contrast.  COMPARISON:  CT 08/09/2013  FINDINGS: MRI HEAD FINDINGS  Chronic infarct left parietal cortex. Chronic microvascular ischemic changes are present in the white matter bilaterally.  1 cm area of acute infarct in the left parietal operculum. Additional area of acute infarct in the left insula. Small area of acute infarct in the left lower parietal lobe inferior to the  chronic cortical infarct.  Negative for hemorrhage or mass. Ventricle size is normal. No shift of the midline structures.  MRA HEAD FINDINGS  Right vertebral artery is widely patent and is normal. Hypoplastic left vertebral artery is small and irregular but does contribute to the basilar. Basilar widely patent. Superior cerebellar and posterior cerebral arteries are widely patent. Posterior communicating artery is patent bilaterally.  Anterior and middle cerebral arteries are widely patent bilaterally without stenosis.  Negative for cerebral aneurysm.  IMPRESSION: Chronic left parietal infarct. Chronic microvascular ischemic change. Extension of acute infarct into the left parietal operculum, left insula, and left inferior parietal lobe, surrounding the area of chronic infarction.  Small irregular distal left vertebral artery. 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   Electronically Signed   By: Franchot Gallo M.D.   On: 08/10/2013 09:14   Mr Jodene Nam Head/brain Wo Cm  08/10/2013   CLINICAL DATA:  Stroke  EXAM: MRI HEAD WITHOUT CONTRAST  MRA HEAD WITHOUT CONTRAST  TECHNIQUE: Multiplanar, multiecho pulse sequences of the brain and surrounding structures were obtained without intravenous contrast. Angiographic images of the head were obtained using MRA technique without contrast.  COMPARISON:  CT 08/09/2013  FINDINGS: MRI HEAD FINDINGS  Chronic infarct left parietal cortex. Chronic microvascular ischemic changes are present in the white matter bilaterally.  1 cm area of acute infarct in the left parietal operculum. Additional area of acute infarct in the left insula. Small area of acute infarct in the left lower parietal lobe inferior to the chronic cortical infarct.  Negative for hemorrhage or mass. Ventricle size is normal. No shift of the midline structures.  MRA HEAD FINDINGS  Right vertebral artery is widely patent and is normal.  Hypoplastic left vertebral artery is small and irregular but does contribute to the basilar. Basilar widely patent. Superior cerebellar and posterior cerebral arteries are widely patent. Posterior communicating artery is patent bilaterally.  Anterior and middle cerebral arteries are widely patent bilaterally without stenosis.  Negative for cerebral aneurysm.  IMPRESSION: Chronic left parietal infarct. Chronic microvascular ischemic change. Extension of acute infarct into the left parietal operculum, left insula, and left inferior parietal lobe, surrounding the area of chronic infarction.  Small irregular distal left vertebral artery. 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   Electronically Signed   By: Franchot Gallo M.D.   On: 08/10/2013 09:14       Medical Problem List and Plan: 1. Functional deficits secondary to left brain infarcts with word finding deficits, weakness. Status post loop recorder to establish  etiology of infarct 2.  DVT Prophylaxis/Anticoagulation:Left upper extremity doppler negative. Subcutaneous  Lovenox. Monitor platelet counts and any signs of bleeding 3. Pain Management: Lyrica 50 mg 3 times a day, Oxycodone as needed. Monitor with increased mobility 4. Mood/depression/anxiety: Patient on Zoloft 50 mg each bedtime prior to admission 5. Neuropsych: This patient is capable of making decisions on her own behalf. 6. Diabetes mellitus with peripheral neuropathy. Hemoglobin A1c 5.8. Check blood sugars a.c. and at bedtime. Sliding scale insulin. Patient on Glucophage 500 mg daily prior to admission. Resume his needed 7. Hypertension. Cozaar 50 mg daily. Monitor with increased mobility. Patient had been on Hyzaar 100-25 daily prior to admission. 8. Hyperthyroidism. Continue Tapazole. TSH level 3.400 9. Hyperlipidemia. Zocor    Post Admission Physician Evaluation: 1. Functional deficits secondary  to left  brain infarcts with weakness, balance loss and expressive language deficits . 2. Patient is admitted to receive collaborative, interdisciplinary care between the physiatrist, rehab nursing staff, and therapy team. 3. Patient's level of medical complexity and substantial therapy needs in context of that medical necessity cannot be provided at a lesser intensity of care such as a SNF. 4. Patient has experienced substantial functional loss from his/her baseline which was documented above under the "Functional History" and "Functional Status" headings.  Judging by the patient's diagnosis, physical exam, and functional history, the patient has potential for functional progress which will result in measurable gains while on inpatient rehab.  These gains will be of substantial and practical use upon discharge  in facilitating mobility and self-care at the household level. 5. Physiatrist will provide 24 hour management of medical needs as well as oversight of the therapy plan/treatment and provide guidance as appropriate regarding the interaction of the two. 6. 24 hour rehab nursing will assist with bladder management, bowel management, safety, skin/wound care, disease management, medication administration, pain management and patient education  and help integrate therapy concepts, techniques,education, etc. 7. PT will assess and treat for/with: Lower extremity strength, range of motion, stamina, balance, functional mobility, safety, adaptive techniques and equipment, NMR, balance, stroke education, egosupport,leisure awareness.   Goals are: mod I to supervision. 8. OT will assess and treat for/with: ADL's, functional mobility, safety, upper extremity strength, adaptive techniques and equipment, NMR, cognitive perceptual awareness, community reintegrations.   Goals are: mod I to supervision. 9. SLP will assess and treat for/with: language,communication, speech, cognition.  Goals are: mod I. 10. Case Management and  Social Worker will assess and treat for psychological issues and discharge planning. 11. Team conference will be held weekly to assess progress toward goals and to determine barriers to discharge. 12. Patient will receive at least 3 hours of therapy per day at least 5 days per week. 13. ELOS: 7 days       14. Prognosis:  excellent     Meredith Staggers, MD, [facility]   08/12/2013

## 2013-08-14 NOTE — Progress Notes (Signed)
Patient is discharged from room 4N27 and transferred to unit 4M03 at this time. Report given to nurse Henrene PastorJanet RN. Alert and in stable condition. Transported via bed with all belongings.

## 2013-08-14 NOTE — Discharge Summary (Signed)
Physician Discharge Summary  Kara Mendoza VZD:[id number] DOB: 1950-06-11 DOA: 08/09/2013  PCP: Dorrene German, MD  Admit date: 08/09/2013 Discharge date: 08/14/2013  Time spent: >30 minutes  Recommendations for Outpatient Follow-up:  [facility] for further rehabilitation  Discharge Diagnoses:  Principal Problem:   Cerebral embolism with cerebral infarction Active Problems:   GERD (gastroesophageal reflux disease)   Hyperlipidemia   Hypertension   Dysarthria   Discharge Condition: Improved/stable  Diet recommendation: Modified will hydrate  Filed Weights   08/09/13 2030  Weight: 78.7 kg (173 lb 8 oz)    History of present illness:  Patient is a 63 y.o. female with a past medical history of a left temporal parietal infarction back in 2008 at which time she presented with expressive aphasia, has history of hypertension, dyslipidemia, tobacco abuse as well, who presents to the emergency room with complaints of dysarthria. Her daughter present at bedside who helped provide history. Patient having difficulties getting her words out with ongoing dysarthria. Daughter states that she last spoke to her mother on Friday which was the last time she could confirm that she was normal. Patient thinks that symptoms may have started today between 12 and 1:00 in the afternoon while at home, that came on with left upper extremity weakness. She lives alone, family members could not confirm exact time symptoms started. They called her this afternoon around 2 pm when they noted her speech impairment, having difficulties understanding her on the telephone. she is currently on Plavix therapy. A CT scan of brain performed in the emergency room showed stable chronic left MCA distribution infarction without acute intracranial abnormality. Case was discussed with Dr. Thad Ranger of neurology who recommended patient be transferred to [facility] for further evaluation and treatment. Prior to transfer she recommended  obtaining a CTA of head and neck. She was admitted for further evaluation and management.   Hospital Course:  1. Chronic L Parietal infarct with extension of Acute infarct in L parietal lobe  As discussed above upon admission neurology was consulted, CTA head and neck were done and came back negative, and MRI/MRA was done and showed chronic left parietal infarct with extension of acute infarct into the left parietal operculum, insulin, left inferior parietal lobe and the surrounding area of chronic infarction. - Patient had been on Plavix and this was continued, her Zocor dose was increased to 40 mg daily which she is to continue upon discharge - Neuro followed patient in the hospital, per Dr.Xu MRI doesn't quite explain stuttering, stated Stuttering likely stress induced - 2D ECHO EF 55% no cardiac source of thrombus  - Carotid duplex: normal CTA  - Pt/OT/St following CIR recommended  -TEE neg,and Loop recorder implanted  -LDL 128, continue statin, hbaic 5.8  -CIR are evaluated patient and she'll be transferred there for further therapy 2. Hypertension.  - Patient is to continue her outpatient medications upon discharge 3. Dyslipidemia.  -continue statin  4. Type 2 diabetes mellitus  -Her metformin was held in the hospital and she was covered with sliding scale coverage, she is to resume her metformin on discharge and follow up outpatient.  -hbaic 5.8  5. Hyperthyroidism.  -Will continue Methimazole, TSH normal  6. Suspected RSD LUE:  -PT, percocet PRN  -obtain US neg for DVT  7. Chest pain  -Patient complained of chest pain in the hospital, cardiac enzymes were cycled -Trops neg, pain resolved  -Cardiology considered the Myoview but on followup Dr. Graciela Husbands recommended no further workup. 8. Anxiety  with home/rent etc     Procedures: Procedures  TEE 7/8- per Dr Eden Emms  Impression:  1) No source of embolus  2) Negative bubble study  3) Normal EF 60%  4) No significant valve  disease  5) No LAA thrombus  6) No aortic debris  7) Normal RV  8) No effusion  7/8 ILR- per Dr Graciela Husbands  Consultations:  Neurology  Cardiology  Discharge Exam: Filed Vitals:   08/14/13 1037  BP: 111/55  Pulse: 72  Temp: 97.6 F (36.4 C)  Resp: 18   Exam:  General: AAOx3, no distress  Cardiovascular: S1S2/RRR  Respiratory: CTAB  Abdomen: soft, NT, BS present  Musculoskeletal: no edema c/c  Neuro: dysarthric, motor 5/5, sensory light touch intact, DTR 2plus,    Discharge Instructions You were cared for by a hospitalist during your hospital stay. If you have any questions about your discharge medications or the care you received while you were in the hospital after you are discharged, you can call the unit and asked to speak with the hospitalist on call if the hospitalist that took care of you is not available. Once you are discharged, your primary care physician will handle any further medical issues. Please note that NO REFILLS for any discharge medications will be authorized once you are discharged, as it is imperative that you return to your primary care physician (or establish a relationship with a primary care physician if you do not have one) for your aftercare needs so that they can reassess your need for medications and monitor your lab values.  Discharge Instructions   Diet Carb Modified    Complete by:  As directed      Increase activity slowly    Complete by:  As directed             Medication List    STOP taking these medications       diclofenac 75 MG EC tablet  Commonly known as:  VOLTAREN     hydrochlorothiazide 50 MG tablet  Commonly known as:  HYDRODIURIL     losartan 100 MG tablet  Commonly known as:  COZAAR      TAKE these medications       albuterol 108 (90 BASE) MCG/ACT inhaler  Commonly known as:  PROVENTIL HFA;VENTOLIN HFA  Inhale 3 puffs into the lungs every 6 (six) hours as needed for wheezing or shortness of breath.     BUTRANS 10  MCG/HR Ptwk patch  Generic drug:  buprenorphine  Place 10 mcg onto the skin once a week.     clopidogrel 75 MG tablet  Commonly known as:  PLAVIX  Take 1 tablet by mouth daily.     HYZAAR 100-25 MG per tablet  Generic drug:  losartan-hydrochlorothiazide  Take 1 tablet by mouth daily.     insulin aspart 100 UNIT/ML injection  Commonly known as:  novoLOG  Inject 0-5 Units into the skin at bedtime.     insulin aspart 100 UNIT/ML injection  Commonly known as:  novoLOG  Inject 0-15 Units into the skin 3 (three) times daily with meals.     meloxicam 7.5 MG tablet  Commonly known as:  MOBIC  Take 7.5 mg by mouth daily.     metFORMIN 500 MG tablet  Commonly known as:  GLUCOPHAGE  Take 500 mg by mouth daily.     methimazole 5 MG tablet  Commonly known as:  TAPAZOLE  Take 5 mg by mouth daily.  NUCYNTA 100 MG Tabs  Generic drug:  Tapentadol HCl  Take 50 mg by mouth 2 (two) times daily.     omeprazole 20 MG capsule  Commonly known as:  PRILOSEC  Take 20 mg by mouth 2 (two) times daily.     ONE-A-DAY WOMENS FORMULA PO  Take 1 tablet by mouth daily.     oxyCODONE-acetaminophen 5-325 MG per tablet  Commonly known as:  PERCOCET/ROXICET  Take 1 tablet by mouth every 6 (six) hours as needed for severe pain.     oxyCODONE-acetaminophen 10-325 MG per tablet  Commonly known as:  PERCOCET  Take 1 tablet by mouth every 6 (six) hours as needed. For pain     pregabalin 50 MG capsule  Commonly known as:  LYRICA  Take 50 mg by mouth 3 (three) times daily.     sennosides-docusate sodium 8.6-50 MG tablet  Commonly known as:  SENOKOT-S  Take 1 tablet by mouth daily.     sertraline 50 MG tablet  Commonly known as:  ZOLOFT  Take 1 tablet by mouth at bedtime.     simvastatin 40 MG tablet  Commonly known as:  ZOCOR  Take 1 tablet (40 mg total) by mouth at bedtime.     VOLTAREN 1 % Gel  Generic drug:  diclofenac sodium  Apply 4 g topically 4 (four) times daily as needed.      zolpidem 5 MG tablet  Commonly known as:  AMBIEN  Take 5 mg by mouth at bedtime.       Allergies  Allergen Reactions  . Aspirin Other (See Comments)    Possibly itching and "feeling little weird"  . Penicillins Hives       Follow-up Information   Follow up with Xu,Jindong, MD. Schedule an appointment as soon as possible for a visit in 2 months. (Stroke Clinic)    Specialty:  Neurology   Contact information:   [address] [place] [id number] [phone]        The results of significant diagnostics from this hospitalization (including imaging, microbiology, ancillary and laboratory) are listed below for reference.    Significant Diagnostic Studies: Ct Angio Head W/cm &/or Wo Cm  08/09/2013   CLINICAL DATA:  exprssive aphasia  EXAM: CT ANGIOGRAPHY HEAD AND NECK  TECHNIQUE: Multidetector CT imaging of the head and neck was performed using the standard protocol during bolus administration of intravenous contrast. Multiplanar CT image reconstructions and MIPs were obtained to evaluate the vascular anatomy. Carotid stenosis measurements (when applicable) are obtained utilizing NASCET criteria, using the distal internal carotid diameter as the denominator.  CONTRAST:  100mL OMNIPAQUE IOHEXOL 350 MG/ML SOLN  COMPARISON:  Head CT same day.  MRI 05/17/2006.  FINDINGS: CTA HEAD FINDINGS  Both internal carotid arteries are widely patent through the siphon regions. No supra clinoid stenosis. The anterior and middle cerebral vessels appear normal without proximal stenosis, aneurysm or vascular malformation. Both vertebral arteries are patent to the basilar with the left being a very small vessel. No basilar stenosis. Posterior circulation branch vessels appear patent and normal.  Intracranial venous structures are patent.  Chronic ischemic changes are seen at the temporoparietal junction region on the left.  Review of the MIP images confirms the above findings.  CTA NECK FINDINGS   Branching pattern of the brachiocephalic vessels from the arch is normal. No origin stenoses. Lung apices are clear.  Both common carotid arteries are widely patent to their respective bifurcation. Both carotid bifurcations appear  normal without evidence of stenosis or irregularity. Both cervical internal carotid arteries are normal.  Both vertebral artery origins are widely patent. The right vertebral artery is larger than the left. Both vessels are patent through the cervical region.  Review of the MIP images confirms the above findings.  IMPRESSION: Negative study. No evidence of atherosclerotic narrowing or dissection. No vessel occlusion. No aneurysm or vascular malformation.   Electronically Signed   By: Paulina Fusi M.D.   On: 08/09/2013 19:01   Dg Chest 2 View  08/09/2013   CLINICAL DATA:  Stroke, hypertension, asthma.  EXAM: CHEST  2 VIEW  COMPARISON:  04/15/2012  FINDINGS: The heart size and mediastinal contours are within normal limits. Both lungs are clear. The visualized skeletal structures are unremarkable.  IMPRESSION: No active cardiopulmonary disease.   Electronically Signed   By: Charlett Nose M.D.   On: 08/09/2013 19:00   Ct Head Wo Contrast  08/09/2013   CLINICAL DATA:  New expressive aphasia, hx of CVA in the past  EXAM: CT HEAD WITHOUT CONTRAST  TECHNIQUE: Contiguous axial images were obtained from the base of the skull through the vertex without intravenous contrast.  COMPARISON:  Head CT 04/20/2008  FINDINGS: Stable area of chronic left temporoparietal infarction. No acute intracranial abnormality. Specifically, no hemorrhage, hydrocephalus, mass lesion, acute infarction, or significant intracranial injury. No acute calvarial abnormality. Visualized paranasal sinuses and mastoid air cells are patent.  IMPRESSION: Stable chronic left MCA distribution infarction. No acute intracranial abnormality.   Electronically Signed   By: Salome Holmes M.D.   On: 08/09/2013 17:05   Ct Angio Neck W/cm  &/or Wo/cm  08/09/2013   CLINICAL DATA:  exprssive aphasia  EXAM: CT ANGIOGRAPHY HEAD AND NECK  TECHNIQUE: Multidetector CT imaging of the head and neck was performed using the standard protocol during bolus administration of intravenous contrast. Multiplanar CT image reconstructions and MIPs were obtained to evaluate the vascular anatomy. Carotid stenosis measurements (when applicable) are obtained utilizing NASCET criteria, using the distal internal carotid diameter as the denominator.  CONTRAST:  OMNIPAQUE IOHEXOL 350 MG/ML SOLN  COMPARISON:  Head CT same day.  MRI 05/17/2006.  FINDINGS: CTA HEAD FINDINGS  Both internal carotid arteries are widely patent through the siphon regions. No supra clinoid stenosis. The anterior and middle cerebral vessels appear normal without proximal stenosis, aneurysm or vascular malformation. Both vertebral arteries are patent to the basilar with the left being a very small vessel. No basilar stenosis. Posterior circulation branch vessels appear patent and normal.  Intracranial venous structures are patent.  Chronic ischemic changes are seen at the temporoparietal junction region on the left.  Review of the MIP images confirms the above findings.  CTA NECK FINDINGS  Branching pattern of the brachiocephalic vessels from the arch is normal. No origin stenoses. Lung apices are clear.  Both common carotid arteries are widely patent to their respective bifurcation. Both carotid bifurcations appear normal without evidence of stenosis or irregularity. Both cervical internal carotid arteries are normal.  Both vertebral artery origins are widely patent. The right vertebral artery is larger than the left. Both vessels are patent through the cervical region.  Review of the MIP images confirms the above findings.  IMPRESSION: Negative study. No evidence of atherosclerotic narrowing or dissection. No vessel occlusion. No aneurysm or vascular malformation.   Electronically Signed   By: Paulina Fusi M.D.   On: 08/09/2013 19:01   Mr Brain Wo Contrast  08/10/2013   CLINICAL DATA:  Stroke  EXAM: MRI HEAD WITHOUT CONTRAST  MRA HEAD WITHOUT CONTRAST  TECHNIQUE: Multiplanar, multiecho pulse sequences of the brain and surrounding structures were obtained without intravenous contrast. Angiographic images of the head were obtained using MRA technique without contrast.  COMPARISON:  CT 08/09/2013  FINDINGS: MRI HEAD FINDINGS  Chronic infarct left parietal cortex. Chronic microvascular ischemic changes are present in the white matter bilaterally.  1 cm area of acute infarct in the left parietal operculum. Additional area of acute infarct in the left insula. Small area of acute infarct in the left lower parietal lobe inferior to the chronic cortical infarct.  Negative for hemorrhage or mass. Ventricle size is normal. No shift of the midline structures.  MRA HEAD FINDINGS  Right vertebral artery is widely patent and is normal. Hypoplastic left vertebral artery is small and irregular but does contribute to the basilar. Basilar widely patent. Superior cerebellar and posterior cerebral arteries are widely patent. Posterior communicating artery is patent bilaterally.  Anterior and middle cerebral arteries are widely patent bilaterally without stenosis.  Negative for cerebral aneurysm.  IMPRESSION: Chronic left parietal infarct. Chronic microvascular ischemic change. Extension of acute infarct into the left parietal operculum, left insula, and left inferior parietal lobe, surrounding the area of chronic infarction.  Small irregular distal left vertebral artery. 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   Electronically Signed   By: Marlan Palau M.D.   On: 08/10/2013 09:14   Mm Digital Screening Bilateral  08/05/2013   CLINICAL DATA:  Screening.  EXAM: DIGITAL SCREENING BILATERAL MAMMOGRAM WITH CAD  COMPARISON:  Previous exam(s).  ACR Breast  Density Category c: The breast tissue is heterogeneously dense, which may obscure small masses.  FINDINGS: There are no findings suspicious for malignancy. Images were processed with CAD.  IMPRESSION: No mammographic evidence of malignancy. A result letter of this screening mammogram will be mailed directly to the patient.  RECOMMENDATION: Screening mammogram in one year. (Code:[id number]-B-01Y)  BI-RADS CATEGORY  1: Negative.   Electronically Signed   By: Gordan Payment M.D.   On: 08/05/2013 10:45   Mr Maxine Glenn Head/brain Wo Cm  08/10/2013   CLINICAL DATA:  Stroke  EXAM: MRI HEAD WITHOUT CONTRAST  MRA HEAD WITHOUT CONTRAST  TECHNIQUE: Multiplanar, multiecho pulse sequences of the brain and surrounding structures were obtained without intravenous contrast. Angiographic images of the head were obtained using MRA technique without contrast.  COMPARISON:  CT 08/09/2013  FINDINGS: MRI HEAD FINDINGS  Chronic infarct left parietal cortex. Chronic microvascular ischemic changes are present in the white matter bilaterally.  1 cm area of acute infarct in the left parietal operculum. Additional area of acute infarct in the left insula. Small area of acute infarct in the left lower parietal lobe inferior to the chronic cortical infarct.  Negative for hemorrhage or mass. Ventricle size is normal. No shift of the midline structures.  MRA HEAD FINDINGS  Right vertebral artery is widely patent and is normal. Hypoplastic left vertebral artery is small and irregular but does contribute to the basilar. Basilar widely patent. Superior cerebellar and posterior cerebral arteries are widely patent. Posterior communicating artery is patent bilaterally.  Anterior and middle cerebral arteries are widely patent bilaterally without stenosis.  Negative for cerebral aneurysm.  IMPRESSION: Chronic left parietal infarct. Chronic microvascular ischemic change. Extension of acute infarct into the left parietal operculum, left insula, and left inferior parietal  lobe, surrounding the area  of chronic infarction.  Small irregular distal left vertebral artery. 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   Electronically Signed   By: Marlan Palau M.D.   On: 08/10/2013 09:14    Microbiology: Recent Results (from the past 240 hour(s))  MRSA PCR SCREENING     Status: None   Collection Time    08/10/13  1:36 AM      Result Value Ref Range Status   MRSA by PCR NEGATIVE  NEGATIVE Final   Comment:            The GeneXpert MRSA Assay (FDA     approved for NASAL specimens     only), is one component of a     comprehensive MRSA colonization     surveillance program. It is not     intended to diagnose MRSA     infection nor to guide or     monitor treatment for     MRSA infections.     Labs: Basic Metabolic Panel:  Recent Labs Lab 08/09/13 1630 08/10/13 0848 08/11/13 0420  NA 140 141 142  K 4.1 3.5* 4.0  CL 101 101 103  CO2 26 26 26   GLUCOSE 109* 142* 111*  BUN 14 11 11   CREATININE 0.80 0.72 0.80  CALCIUM 10.2 9.8 9.8   Liver Function Tests:  Recent Labs Lab 08/09/13 1630 08/11/13 0420  AST 14 14  ALT 16 15  ALKPHOS 69 59  BILITOT 0.6 0.6  PROT 7.5 6.6  ALBUMIN 4.1 3.7   No results found for this basename: LIPASE, AMYLASE,  in the last 168 hours No results found for this basename: AMMONIA,  in the last 168 hours CBC:  Recent Labs Lab 08/09/13 1630 08/10/13 0848 08/11/13 0420  WBC 9.6 6.7 7.7  HGB 14.9 14.8 13.7  HCT 43.3 42.6 41.3  MCV 86.4 86.6 88.4  PLT 343 334 321   Cardiac Enzymes:  Recent Labs Lab 08/09/13 1630 08/12/13 1145 08/12/13 1547 08/12/13 2115  TROPONINI <0.30 <0.30 <0.30 <0.30   BNP: BNP (last 3 results) No results found for this basename: PROBNP,  in the last 8760 hours CBG:  Recent Labs Lab 08/13/13 0639 08/13/13 1123 08/13/13 1628 08/13/13 2203 08/14/13 0709  GLUCAP 134* 116* 120* 120* 119*        Signed:  Lamerle Jabs C  [facility] 08/14/2013, 11:50 AM

## 2013-08-14 NOTE — Interval H&P Note (Signed)
Kara HammockSheila L Mendoza was admitted today to Inpatient Rehabilitation with the diagnosis of left brain infarcts.  The patient's history has been reviewed, patient examined, and there is no change in status.  Patient continues to be appropriate for intensive inpatient rehabilitation.  I have reviewed the patient's chart and labs.  Questions were answered to the patient's satisfaction.  Kara Mendoza T 08/14/2013, 4:10 PM

## 2013-08-14 NOTE — Progress Notes (Signed)
Rehab admissions - [facility] and I have faxed info requesting acute inpatient rehab admission.  If we receive approval, we should be able to admit to inpatient rehab later today.  I will follow up as soon as I hear back from insurance case manager.  Call me for questions.  #[phone]

## 2013-08-14 NOTE — Progress Notes (Signed)
Patient called Clinical research associatewriter to her room an complained of "Different feeling to right anterior thigh which feels like spasms and stabbing pain". Dr. Romeo RabonVyuoh on the unit and came to assess patient. Orders written and carried out. Patient stable at this time and will continue to monitor

## 2013-08-15 ENCOUNTER — Inpatient Hospital Stay (HOSPITAL_COMMUNITY): Payer: Medicare HMO | Admitting: *Deleted

## 2013-08-15 ENCOUNTER — Inpatient Hospital Stay (HOSPITAL_COMMUNITY): Payer: Medicare HMO | Admitting: Speech Pathology

## 2013-08-15 DIAGNOSIS — I639 Cerebral infarction, unspecified: Secondary | ICD-10-CM | POA: Diagnosis present

## 2013-08-15 LAB — GLUCOSE, CAPILLARY
Glucose-Capillary: 126 mg/dL — ABNORMAL HIGH (ref 70–99)
Glucose-Capillary: 109 mg/dL — ABNORMAL HIGH (ref 70–99)
Glucose-Capillary: 117 mg/dL — ABNORMAL HIGH (ref 70–99)
Glucose-Capillary: 106 mg/dL — ABNORMAL HIGH (ref 70–99)

## 2013-08-15 MED ORDER — OXYCODONE-ACETAMINOPHEN 5-325 MG PO TABS
1.0000 | ORAL_TABLET | Freq: Three times a day (TID) | ORAL | Status: DC | PRN
  Administered 2013-08-15 – 2013-08-22 (×11): 1 via ORAL
  Filled 2013-08-15 (×11): qty 1

## 2013-08-15 MED ORDER — PREGABALIN 50 MG PO CAPS
100.0000 mg | ORAL_CAPSULE | Freq: Two times a day (BID) | ORAL | Status: DC
  Administered 2013-08-15: 100 mg via ORAL
  Filled 2013-08-15: qty 2

## 2013-08-15 MED ORDER — SERTRALINE HCL 50 MG PO TABS
50.0000 mg | ORAL_TABLET | Freq: Every day | ORAL | Status: DC
  Administered 2013-08-15 – 2013-08-17 (×3): 50 mg via ORAL
  Filled 2013-08-15 (×5): qty 1

## 2013-08-15 MED ORDER — ONDANSETRON HCL 4 MG/2ML IJ SOLN: 4.0000 mg | Freq: Four times a day (QID) | INTRAMUSCULAR | Status: DC | PRN

## 2013-08-15 MED ORDER — SENNOSIDES-DOCUSATE SODIUM 8.6-50 MG PO TABS
1.0000 | ORAL_TABLET | Freq: Every evening | ORAL | Status: DC | PRN
  Administered 2013-08-18 – 2013-08-20 (×2): 1 via ORAL
  Filled 2013-08-15 (×2): qty 1

## 2013-08-15 MED ORDER — LOSARTAN POTASSIUM 50 MG PO TABS
50.0000 mg | ORAL_TABLET | Freq: Every day | ORAL | Status: DC
  Administered 2013-08-15 – 2013-08-22 (×8): 50 mg via ORAL
  Filled 2013-08-15 (×9): qty 1

## 2013-08-15 MED ORDER — PREGABALIN 50 MG PO CAPS
50.0000 mg | ORAL_CAPSULE | Freq: Three times a day (TID) | ORAL | Status: DC
  Administered 2013-08-15: 50 mg via ORAL
  Filled 2013-08-15: qty 1

## 2013-08-15 MED ORDER — ENOXAPARIN SODIUM 40 MG/0.4ML ~~LOC~~ SOLN
40.0000 mg | Freq: Every day | SUBCUTANEOUS | Status: DC
  Administered 2013-08-15 – 2013-08-22 (×8): 40 mg via SUBCUTANEOUS
  Filled 2013-08-15 (×9): qty 0.4

## 2013-08-15 MED ORDER — ACETAMINOPHEN 325 MG PO TABS
325.0000 mg | ORAL_TABLET | ORAL | Status: DC | PRN
  Administered 2013-08-17 – 2013-08-19 (×2): 650 mg via ORAL
  Filled 2013-08-15 (×2): qty 2

## 2013-08-15 MED ORDER — SORBITOL 70 % SOLN
30.0000 mL | Freq: Every day | Status: DC | PRN
  Administered 2013-08-20: 30 mL via ORAL
  Filled 2013-08-15: qty 30

## 2013-08-15 MED ORDER — CLOPIDOGREL BISULFATE 75 MG PO TABS
75.0000 mg | ORAL_TABLET | Freq: Every day | ORAL | Status: DC
  Administered 2013-08-15 – 2013-08-22 (×8): 75 mg via ORAL
  Filled 2013-08-15 (×9): qty 1

## 2013-08-15 MED ORDER — METHIMAZOLE 5 MG PO TABS
5.0000 mg | ORAL_TABLET | Freq: Every day | ORAL | Status: DC
  Administered 2013-08-15 – 2013-08-22 (×8): 5 mg via ORAL
  Filled 2013-08-15 (×10): qty 1

## 2013-08-15 MED ORDER — ONDANSETRON HCL 4 MG PO TABS: 4.0000 mg | ORAL_TABLET | Freq: Four times a day (QID) | ORAL | Status: DC | PRN

## 2013-08-15 MED ORDER — NORTRIPTYLINE HCL 10 MG PO CAPS
10.0000 mg | ORAL_CAPSULE | Freq: Every day | ORAL | Status: DC
  Administered 2013-08-15 – 2013-08-21 (×7): 10 mg via ORAL
  Filled 2013-08-15 (×8): qty 1

## 2013-08-15 MED ORDER — INSULIN ASPART 100 UNIT/ML ~~LOC~~ SOLN
0.0000 [IU] | Freq: Three times a day (TID) | SUBCUTANEOUS | Status: DC
  Administered 2013-08-15: 2 [IU] via SUBCUTANEOUS

## 2013-08-15 MED ORDER — ENOXAPARIN SODIUM 40 MG/0.4ML ~~LOC~~ SOLN: 40.0000 mg | SUBCUTANEOUS | Status: DC

## 2013-08-15 MED ORDER — SIMVASTATIN 40 MG PO TABS
40.0000 mg | ORAL_TABLET | Freq: Every day | ORAL | Status: DC
  Administered 2013-08-15 – 2013-08-21 (×7): 40 mg via ORAL
  Filled 2013-08-15 (×9): qty 1

## 2013-08-15 NOTE — Progress Notes (Signed)
[facility] PHYSICAL MEDICINE & REHABILITATION     PROGRESS NOTE    Subjective/Complaints: No specific issues. Left arm remains sensitive. Right hip tender as well (numb and tingling)----she is a VERY POOR historian and ROS limited by cognition/speech  Objective: Vital Signs: Blood pressure 114/64, pulse 69, temperature 97.8 F (36.6 C), temperature source Oral, resp. rate 18, height 5\' 2"  (1.575 m), weight 74.254 kg (163 lb 11.2 oz), SpO2 96.00%. No results found. No results found for this basename: WBC, HGB, HCT, PLT,  in the last 72 hours  Recent Labs  08/14/13 1320  NA 140  K 4.9  CL 99  GLUCOSE 137*  BUN 13  CREATININE 0.75  CALCIUM 10.0   CBG (last 3)   Recent Labs  08/14/13 1642 08/14/13 2041 08/15/13 0742  GLUCAP 89 148* 126*    Wt Readings from Last 3 Encounters:  08/14/13 74.254 kg (163 lb 11.2 oz)  08/09/13 78.7 kg (173 lb 8 oz)  08/09/13 78.7 kg (173 lb 8 oz)    Physical Exam:  General: no distress.  Eyes: EOM are normal.  Mouth: oral mucosa pink/moist  Neck: Normal range of motion. Neck supple. No thyromegaly present.  Cardiovascular: Normal rate and regular rhythm. No murmurs  Respiratory: Effort normal and breath sounds normal. No respiratory distress. No wheezes  GI: Soft. Bowel sounds are normal. She exhibits no distension.  Musculoskeletal:  Noted hypersensitivity to left forearm to palpation and simple ROM--allodynia   Neurological: She is alert and oriented x 3. Reasonable insight and awareness. Follows all simple commands. Stuttering speech is persistent. LUE grossly 2+/5 with pain still an issue. LLE is 3 to 4-/5 prox to distal. Sensation 1/2 LUE. Distal sensory loss to LT in both legs below the knees but inconsistent. Left thigh with subjective sensory loss. Speech dysarthric. Left facial weakness.  Psych: anxiety improved, pleasant and cooperative.   Assessment/Plan: 1. Functional deficits secondary to left brain infarcts which require  3+ hours per day of interdisciplinary therapy in a comprehensive inpatient rehab setting. Physiatrist is providing close team supervision and 24 hour management of active medical problems listed below. Physiatrist and rehab team continue to assess barriers to discharge/monitor patient progress toward functional and medical goals. FIM:                                  Medical Problem List and Plan:  1. Functional deficits secondary to left brain infarcts with word finding deficits, weakness. Status post loop recorder to establish etiology of infarct   -some of deficits are old including her stuttering speech 2. DVT Prophylaxis/Anticoagulation:Left upper extremity doppler negative. Subcutaneous Lovenox. Monitor platelet counts and any signs of bleeding  3. Pain Management, left arm pain  -pt was seen by neurology in November who felt this was "musculoskeletal"  -it is entirely unclear when this began---some reports indicate 2008 while others state it's more recently. The patient seems to indicate that it began more recently  -it appears to me to be consistent with CRPS. Not a great steroid candidate due to diabetes  -will titrate lyrica further up to 100mg  tid, consider TCA as well or SSNRI  - Oxycodone as needed. Monitor with increased mobility  4. Mood/depression/anxiety: Patient on Zoloft 50 mg each bedtime prior to admission  5. Neuropsych: This patient is capable of making decisions on her own behalf.  6. Diabetes mellitus with peripheral neuropathy. Hemoglobin A1c 5.8. Check  blood sugars a.c. and at bedtime. Sliding scale insulin. Patient on Glucophage 500 mg daily prior to admission. Resume his needed  7. Hypertension. Cozaar 50 mg daily. Monitor with increased mobility. Patient had been on Hyzaar 100-25 daily prior to admission.  8. Hyperthyroidism. Continue Tapazole. TSH level 3.400  9. Hyperlipidemia. Zocor  LOS (Days) 1 A FACE TO FACE EVALUATION WAS  PERFORMED  Hildur Bayer T 08/15/2013 7:53 AM

## 2013-08-15 NOTE — Evaluation (Signed)
Physical Therapy Assessment and Plan  Patient Details  Name: Kara Mendoza MRN: [id number] Date of Birth: 10-Feb-1950  PT Diagnosis: Abnormality of gait, Carpal tunnel syndrome, Coordination disorder, Impaired sensation and Muscle weakness Rehab Potential: Good ELOS: 10-12 days   Today's Date: 08/15/2013 Time: 0626-9485 Time Calculation (min): 60 min  Problem List:  Patient Active Problem List   Diagnosis Date Noted  . CVA (cerebral infarction) 08/15/2013  . Cerebral embolism with cerebral infarction 08/09/2013  . Dysarthria 08/09/2013  . Abnormality of gait 05/01/2012  . Incontinence of urine 05/01/2012  . Pneumonia 03/10/2012  . Tobacco abuse 01/28/2012  . Fatigue 01/01/2011  . Weakness generalized 11/03/2010  . Headache 09/01/2010  . Plantar fasciitis 09/01/2010  . Allergic rhinitis 07/11/2010  . Persistent cough 06/16/2010  . Asthma night-time symptoms 06/14/2010  . GERD (gastroesophageal reflux disease) 05/26/2010  . Stroke 05/26/2010  . Hyperlipidemia 05/26/2010  . Hypertension 05/26/2010  . Neuropathy 05/26/2010  . Osteoarthritis 05/26/2010    Past Medical History:  Past Medical History  Diagnosis Date  . Neuromuscular disorder   . Hypertension   . Hyperlipidemia   . Asthma   . Diabetes mellitus   . Hyperthyroidism   . Esophageal reflux   . Constipation   . Major depressive disorder, recurrent episode, moderate   . Anxiety state, unspecified   . History of fall   . Abnormality of gait   . Chronic pain syndrome   . Tobacco use disorder   . Osteoarthritis   . Dysarthria   . Acute bronchitis   . Chronic arm pain 2008    left  . Chronic leg pain 2008    left  . Fatigue   . Chronic headache   . Stroke    Past Surgical History:  Past Surgical History  Procedure Laterality Date  . Cesarean section      x3  . Finger surgery    . Loop recorder implant  08-12-2013    MDT LINQ implanted by Dr Caryl Comes for cryptogenic stroke  . Tee without cardioversion  N/A 08/12/2013    Procedure: TRANSESOPHAGEAL ECHOCARDIOGRAM (TEE);  Surgeon: Josue Hector, MD;  Location: [facility];  Service: Cardiovascular;  Laterality: N/A;    Assessment & Plan Clinical Impression History:Kara Mendoza is an 63 y.o. female, right handed, with a past medical history relevant for left MCA distribution infarct back in 2008 with residual language impairment, hypertension, dyslipidemia, tobacco abuse, transferred to [facility] for further evaluation and management of dysarthria. She initially presented to [facility] 08/09/2013 because of difficulty expressing herself. She indicated that she had had speech trouble since her stroke in 2008, but yesterday 08/08/2013 around noon time she got very concerned because all of the sudden she couldn't get words out even knowing what she wanted to say, and also had left upper extremity weakness and that problem has been present evert since. Denies associated HA, vertigo, double vision, difficulty swallowing, confusion, or vision impairment. No chest pain or palpitations. She lives alone and patient's daughter said that she called her yesterday around 2 pm and couldn't understand what she was trying to say. CT/CTA brain showed no acute abnormality. Takes plavix daily. Patient c/o pain in the left hand states she has been recently diagnosed with carpel tunnel syndrome and has been taking percocet at home. Her Time last known well was unclear. She was not a tPA candidate secondary to delay in arrival. She was admitted for further evaluation.    Patient currently requires min with  mobility secondary to decreased coordination.  Prior to hospitalization, patient was independent  with mobility and lived alone , was supposed to have an aid coming 7days/week, however they do not always show up.ome access is 6Stairs to enter.  Patient will benefit from skilled PT intervention to maximize safe functional mobility for planned discharge home with intermittent assist.   Anticipate patient will benefit from follow up [facility] at discharge.  PT - End of Session Activity Tolerance: Tolerates 30+ min activity with multiple rests Endurance Deficit: Yes Endurance Deficit Description: Decreased tolerance to standing activities, SOB  PT Assessment Rehab Potential: Good Barriers to Discharge: Decreased caregiver support PT Patient demonstrates impairments in the following area(s): Balance;Endurance;Safety;Pain PT Transfers Functional Problem(s): Bed to Chair PT Locomotion Functional Problem(s): Ambulation PT Plan PT Intensity: Minimum of 1-2 x/day ,45 to 90 minutes PT Frequency: 5 out of 7 days PT Duration Estimated Length of Stay: 10-12 days PT Treatment/Interventions: Ambulation/gait training;Balance/vestibular training;Neuromuscular re-education;Functional mobility training;Therapeutic Activities;Stair training;Patient/family education;Therapeutic Exercise;UE/LE Coordination activities;UE/LE Strength taining/ROM PT Transfers Anticipated Outcome(s): Modified Independance PT Locomotion Anticipated Outcome(s): Modified Independance PT Recommendation Recommendations for Other Services: Neuropsych consult Follow Up Recommendations: Home health PT Patient destination: Home Equipment Recommended: To be determined  Skilled Therapeutic Intervention  Today's session focused on assessment of patient status. Transfer training- min A to Supervision ,depending on the height of the surface patient is arising from. Gait training 2 x 125 feet with HHA and WBQC. Stair training 2 x 5 steps up/down , step to pattern , uses B Rail, increased need for VC for proper sequencing and safety. Balance testing toasses risk for falls, Patient scores 39/56.    PT Evaluation Precautions/Restrictions Precautions Precautions: Fall Precaution Comments: LUE pain (8/10; sharp from hand to deltoid) Restrictions Weight Bearing Restrictions: No General Chart Reviewed: Yes Vital Signs   Pain Pain Assessment Pain Assessment: 0-10 Pain Score: 9  Pain Type: Chronic pain Pain Location: Arm Pain Orientation: Left Pain Descriptors / Indicators: Constant Pain Onset: On-going Patients Stated Pain Goal: 0 Pain Intervention(s): Medication (See eMAR) Home Living/Prior Functioning Home Living Available Help at Discharge: Personal care attendant;Available PRN/intermittently Type of Home: House Home Access: Stairs to enter [employer] of Steps: 6 Entrance Stairs-Rails: Right Home Layout: One level  Lives With:  (Patientis suposed  to have aid coming 7/week ,but they don't always show up) Prior Function Level of Independence: Independent with gait;Independent with basic ADLs  Able to Take Stairs?: Reciprically Driving: No Leisure: Hobbies-yes (Comment) Comments: Singing, traveling , bowling,puzzles Vision/Perception  Vision - History Baseline Vision: Bifocals Visual History: Cataracts Patient Visual Report: Diplopia;Blurring of vision (see shite lights in both eye) Vision - Assessment Eye Alignment: Within Functional Limits Vision Assessment: Vision impaired - to be further tested in functional context Additional Comments:  (C/o white lights out side of eyes) Perception Perception: Within Functional Limits Praxis Praxis: Intact  Cognition Overall Cognitive Status: Within Functional Limits for tasks assessed Arousal/Alertness: Awake/alert Orientation Level: Oriented X4 Attention: Focused;Selective Selective Attention: Appears intact Memory: Appears intact Awareness: Appears intact Problem Solving: Appears intact Safety/Judgment: Appears intact Sensation Sensation Light Touch: Impaired by gross assessment Light Touch Impaired Details: Impaired RLE Stereognosis: Impaired by gross assessment Hot/Cold: Impaired by gross assessment Proprioception: Impaired by gross assessment Coordination Gross Motor Movements are Fluid and Coordinated: Yes Fine  Motor Movements are Fluid and Coordinated: No Coordination and Movement Description:  (Pt. primarliy uses RUE and LUE as active assist) Motor  Motor Motor: Within Functional Limits  Mobility Bed Mobility Bed  Mobility: Rolling Right;Rolling Left;Right Sidelying to Sit Rolling Right: 4: Min assist Rolling Right Details: Visual cues/gestures for precautions/safety Right Sidelying to Sit: 5: Supervision Right Sidelying to Sit Details: Visual cues/gestures for precautions/safety Supine to Sit: 5: Supervision Transfers Transfers: Yes Sit to Stand: 4: Min assist Sit to Stand Details: Verbal cues for technique Stand to Sit: 4: Min assist Stand Pivot Transfers: 4: Min Psychologist, occupational Details: Verbal cues for technique;Verbal cues for precautions/safety Locomotion  Ambulation Ambulation: Yes Ambulation/Gait Assistance: 4: Min assist Ambulation Distance (Feet): 125 Feet Assistive device: Small based quad cane Ambulation/Gait Assistance Details: Verbal cues for gait pattern Gait Gait: Yes Gait Pattern: Narrow base of support;Step-to pattern Gait velocity: Decreased velocity Stairs / Additional Locomotion Stairs: Yes Stairs Assistance: 4: Min assist Stair Management Technique: Two rails Number of Stairs: 5 Wheelchair Mobility Wheelchair Mobility: Yes Wheelchair Assistance: 4: Min Mining engineer Propulsion: Right upper extremity;Both lower extermities Distance: 150  Trunk/Postural Assessment  Cervical Assessment Cervical Assessment: Within Functional Limits Thoracic Assessment Thoracic Assessment: Within Functional Limits Lumbar Assessment Lumbar Assessment: Within Functional Limits Postural Control Postural Control: Within Functional Limits  Balance Balance Balance Assessed: Yes Standardized Balance Assessment Standardized Balance Assessment: Berg Balance Test Berg Balance Test Sit to Stand: Able to stand  independently using hands Standing Unsupported: Able  to stand 2 minutes with supervision Sitting with Back Unsupported but Feet Supported on Floor or Stool: Able to sit safely and securely 2 minutes Stand to Sit: Controls descent by using hands Transfers: Able to transfer safely, definite need of hands Standing Unsupported with Eyes Closed: Able to stand 10 seconds with supervision Standing Ubsupported with Feet Together: Able to place feet together independently and stand for 1 minute with supervision From Standing, Reach Forward with Outstretched Arm: Can reach forward >5 cm safely (2") From Standing Position, Pick up Object from Floor: Able to pick up shoe, needs supervision From Standing Position, Turn to Look Behind Over each Shoulder: Looks behind from both sides and weight shifts well Turn 360 Degrees: Able to turn 360 degrees safely but slowly Standing Unsupported, Alternately Place Feet on Step/Stool: Able to stand independently and complete 8 steps >20 seconds Standing Unsupported, One Foot in Front: Needs help to step but can hold 15 seconds Standing on One Leg: Able to lift leg independently and hold equal to or more than 3 seconds Total Score: 39 Dynamic Gait Index Level Surface: Mild Impairment Change in Gait Speed: Mild Impairment Gait with Horizontal Head Turns: Mild Impairment Gait with Vertical Head Turns: Mild Impairment Gait and Pivot Turn: Mild Impairment Step Over Obstacle: Mild Impairment Step Around Obstacles: Mild Impairment Steps: Mild Impairment Total Score: 16 Dynamic Sitting Balance Dynamic Sitting - Level of Assistance: 5: Stand by assistance Dynamic Standing Balance Dynamic Standing - Level of Assistance: 4: Min assist Extremity Assessment  RUE Assessment RUE Assessment: Within Functional Limits LUE Assessment LUE Assessment: Exceptions to WFL LUE AROM (degrees) LUE Overall AROM Comments:  (Pt has CTS with LUE and 8/10 pain scale) RLE Assessment RLE Assessment: Within Functional Limits LLE  Assessment LLE Assessment: Within Functional Limits  FIM:  FIM - Control and instrumentation engineer Devices:  (HHA from bed to sink  (min assist)) Bed/Chair Transfer: 5: Supine > Sit: Supervision (verbal cues/safety issues);4: Bed > Chair or W/C: Min A (steadying Pt. > 75%) FIM - Locomotion: Wheelchair Distance: 150 FIM - Locomotion: Ambulation Ambulation/Gait Assistance: 4: Min assist   Refer to Care Plan for Long Term Goals  Recommendations  for other services: Neuropsych  Discharge Criteria: Patient will be discharged from PT if patient refuses treatment 3 consecutive times without medical reason, if treatment goals not met, if there is a change in medical status, if patient makes no progress towards goals or if patient is discharged from hospital.  The above assessment, treatment plan, treatment alternatives and goals were discussed and mutually agreed upon: by patient  [place]ad[place]pe Spanish 08/15/2013, 12:11 PM

## 2013-08-15 NOTE — Evaluation (Signed)
Speech Language Pathology Assessment and Plan  Patient Details  Name: Kara Mendoza MRN: [id number] Date of Birth: [birth year]  SLP Diagnosis: Aphasia;Apraxia  Rehab Potential: Good ELOS: 10-12 days   Today's Date: 08/15/2013 Time: 1100-1200 Time Calculation (min): 60 min  Problem List:  Patient Active Problem List   Diagnosis Date Noted  . CVA (cerebral infarction) 08/15/2013  . Cerebral embolism with cerebral infarction 08/09/2013  . Dysarthria 08/09/2013  . Abnormality of gait 05/01/2012  . Incontinence of urine 05/01/2012  . Pneumonia 03/10/2012  . Tobacco abuse 01/28/2012  . Fatigue 01/01/2011  . Weakness generalized 11/03/2010  . Headache 09/01/2010  . Plantar fasciitis 09/01/2010  . Allergic rhinitis 07/11/2010  . Persistent cough 06/16/2010  . Asthma night-time symptoms 06/14/2010  . GERD (gastroesophageal reflux disease) 05/26/2010  . Stroke 05/26/2010  . Hyperlipidemia 05/26/2010  . Hypertension 05/26/2010  . Neuropathy 05/26/2010  . Osteoarthritis 05/26/2010   Past Medical History:  Past Medical History  Diagnosis Date  . Neuromuscular disorder   . Hypertension   . Hyperlipidemia   . Asthma   . Diabetes mellitus   . Hyperthyroidism   . Esophageal reflux   . Constipation   . Major depressive disorder, recurrent episode, moderate   . Anxiety state, unspecified   . History of fall   . Abnormality of gait   . Chronic pain syndrome   . Tobacco use disorder   . Osteoarthritis   . Dysarthria   . Acute bronchitis   . Chronic arm pain 2008    left  . Chronic leg pain 2008    left  . Fatigue   . Chronic headache   . Stroke    Past Surgical History:  Past Surgical History  Procedure Laterality Date  . Cesarean section      x3  . Finger surgery    . Loop recorder implant  08-12-2013    MDT LINQ implanted by Dr Caryl Comes for cryptogenic stroke  . Tee without cardioversion N/A 08/12/2013    Procedure: TRANSESOPHAGEAL ECHOCARDIOGRAM (TEE);  Surgeon: Josue Hector, MD;  Location: [facility] ENDOSCOPY;  Service: Cardiovascular;  Laterality: N/A;    Assessment / Plan / Recommendation Clinical Impression Patient presents with mild expressive aphasia characterized by word finding difficulties mainly at sentence and conversational levels, but with some difficulty with confrontational naming. Patient also presents with a motor-speech disorder which has resulted in stuttering (phoneme and part-word repetitions) which patient is aware of but unable to control. Patient is aware of expressive aphasia and demonstrated ability to cue herself to "slow down" and to describe word when she could not think of what she wanted to say but was not consistent. Patient stated that she was supposed to have outpatient speech therapy after initial CVA in 2008 but that she had some problems with transportation and wasn't able to attend. Patient's deficits impact her ability to fully express her wants/needs and she will require speech-language pathlogy intervention in order to improve.  Skilled Therapeutic Interventions            SLP Assessment  Patient will need skilled [facility] during CIR admission    Recommendations  Follow up Recommendations: Outpatient SLP;Home Health SLP (pending ability for transportation, HH vs. outpatient) Equipment Recommended: None recommended by SLP    SLP Frequency 5 out of 7 days   SLP Treatment/Interventions Cueing hierarchy;Speech/Language facilitation;Therapeutic Activities;Patient/family education;Functional tasks    Pain Pain Assessment Pain Assessment: No/denies pain Prior Functioning Cognitive/Linguistic Baseline: Baseline deficits  Baseline deficit details: persistent expressive aphasia from stroke in 2008 Type of Home: House (patient stated that she was renting and that there is a payment dispute with her landlord which may lead to her being evicted)  Lives With: Alone Available Help at Discharge: Personal care  attendant;Available PRN/intermittently  Short Term Goals: Week 1: SLP Short Term Goal 1 (Week 1): Patient will maintain fluency at sentence level using continuous phonation with 85% accuracy and minimal cues.  SLP Short Term Goal 2 (Week 1): Patient will utilize word-finding strategies consistently at conversational level (slow down, pause and think; describe word/circumlocute) with 80% accuracy.  See FIM for current functional status Refer to Care Plan for Long Term Goals  Recommendations for other services: None  Discharge Criteria: Patient will be discharged from SLP if patient refuses treatment 3 consecutive times without medical reason, if treatment goals not met, if there is a change in medical status, if patient makes no progress towards goals or if patient is discharged from hospital.  The above assessment, treatment plan, treatment alternatives and goals were discussed and mutually agreed upon: by patient  Dannial Monarch 08/15/2013, 4:28 PM  Sonia Baller, MA, CCC-SLP [facility] Psychiatric Speech-Language Pathologist

## 2013-08-15 NOTE — Evaluation (Signed)
Occupational Therapy Assessment and Plan  Patient Details  Name: Kara Mendoza MRN: [id number] Date of Birth: January 22, 1951  OT Diagnosis: acute pain, muscular wasting and disuse atrophy, other lymphedema and swelling of limb Rehab Potential: Rehab Potential: Good ELOS: 7-9 days   Today's Date: 08/15/2013 Time: 1100-1200 Time Calculation (min): 60 min  Problem List:  Patient Active Problem List   Diagnosis Date Noted  . CVA (cerebral infarction) 08/15/2013  . Cerebral embolism with cerebral infarction 08/09/2013  . Dysarthria 08/09/2013  . Abnormality of gait 05/01/2012  . Incontinence of urine 05/01/2012  . Pneumonia 03/10/2012  . Tobacco abuse 01/28/2012  . Fatigue 01/01/2011  . Weakness generalized 11/03/2010  . Headache 09/01/2010  . Plantar fasciitis 09/01/2010  . Allergic rhinitis 07/11/2010  . Persistent cough 06/16/2010  . Asthma night-time symptoms 06/14/2010  . GERD (gastroesophageal reflux disease) 05/26/2010  . Stroke 05/26/2010  . Hyperlipidemia 05/26/2010  . Hypertension 05/26/2010  . Neuropathy 05/26/2010  . Osteoarthritis 05/26/2010    Past Medical History:  Past Medical History  Diagnosis Date  . Neuromuscular disorder   . Hypertension   . Hyperlipidemia   . Asthma   . Diabetes mellitus   . Hyperthyroidism   . Esophageal reflux   . Constipation   . Major depressive disorder, recurrent episode, moderate   . Anxiety state, unspecified   . History of fall   . Abnormality of gait   . Chronic pain syndrome   . Tobacco use disorder   . Osteoarthritis   . Dysarthria   . Acute bronchitis   . Chronic arm pain 2008    left  . Chronic leg pain 2008    left  . Fatigue   . Chronic headache   . Stroke    Past Surgical History:  Past Surgical History  Procedure Laterality Date  . Cesarean section      x3  . Finger surgery    . Loop recorder implant  08-12-2013    MDT LINQ implanted by Dr Caryl Comes for cryptogenic stroke  . Tee without cardioversion  N/A 08/12/2013    Procedure: TRANSESOPHAGEAL ECHOCARDIOGRAM (TEE);  Surgeon: Josue Hector, MD;  Location: [facility] ENDOSCOPY;  Service: Cardiovascular;  Laterality: N/A;    Assessment & Plan Clinical Impression:Admitted with slurred speech. PMHx left temporal parietal infarction back in 2008 at which time she presented with expressive aphasia, has history of hypertension, dyslipidemia, tobacco abuse as well. Pt with very painfull LUE  .  Patient transferred to CIR on 08/14/2013 .    Patient currently requires min with basic self-care skills secondary to muscle weakness.  Prior to hospitalization, patient could complete BADL with modified independent .  Patient will benefit from skilled intervention to increase independence with basic self-care skills and increase level of independence with iADL prior to discharge home independently.  Anticipate patient will require intermittent supervision and minimal physical assistance and follow up home health.  OT - End of Session Activity Tolerance: Tolerates 30+ min activity without fatigue Endurance Deficit: Yes Endurance Deficit Description:  (SOB with bathing and dressing) OT Assessment Rehab Potential: Good Barriers to Discharge:  (none) OT Patient demonstrates impairments in the following area(s): Edema;Endurance;Motor;Pain;Sensory;Vision OT Basic ADL's Functional Problem(s): Grooming;Bathing;Dressing;Toileting OT Advanced ADL's Functional Problem(s): Simple Meal Preparation;Laundry;Light Housekeeping OT Transfers Functional Problem(s): Toilet;Tub/Shower OT Additional Impairment(s): Fuctional Use of Upper Extremity OT Plan OT Intensity: Minimum of 1-2 x/day, 45 to 90 minutes OT Frequency: 5 out of 7 days OT Duration/Estimated Length of Stay: 7-9 days OT  Treatment/Interventions: Publishing copy;Functional mobility training;Pain management;Patient/family education;Self Care/advanced ADL  retraining;Therapeutic Activities;Therapeutic Exercise;UE/LE Strength taining/ROM OT Self Feeding Anticipated Outcome(s): independent OT Basic Self-Care Anticipated Outcome(s): mod independent OT Toileting Anticipated Outcome(s): mod independent OT Bathroom Transfers Anticipated Outcome(s): mod I with toilet and supervision with tub OT Recommendation Recommendations for Other Services:  (none) Patient destination: Home Follow Up Recommendations: Home health OT Equipment Recommended: To be determined   Skilled Therapeutic Intervention 2nd session:  Time:  1330-1400  (30 min) Pain:  LUE 8/10  Nursing aware Individual session Addressed POC and patient's goals.  She wants to be able to cook and clean. She was getting HHA 5x week for house duties but states the HHA was inconsistent. Pt's pain in her LUE is severe  And she has lived with for 9 months. She needs to have surgery but says now with the stroke, she does not know when she will have the procedure.   Pt. reported having issues with the rent being paid on her apartment.  She reported that she paid it but the land lord said it was not paid.  She talked about disagreements with her immediate family as well.  Ambulated to toilet and was supervision with transfer and clothes.  Discussed taking a shower on Sunday.  Pt agreed.   OT Evaluation Precautions/Restrictions  Precautions Precautions: Fall Precaution Comments: LUE pain (8/10; sharp from hand to deltoid) Restrictions Weight Bearing Restrictions: No    Pain Pain Assessment Pain Assessment: 0-10 Pain Score: 9  Pain Type: Chronic pain Pain Location: Arm Pain Orientation: Left Pain Descriptors / Indicators: Constant Pain Onset: On-going Patients Stated Pain Goal: 0 Pain Intervention(s): Medication (See eMAR) Home Living/Prior Functioning Home Living Available Help at Discharge: Personal care attendant;Available PRN/intermittently Type of Home: House Home Access: Stairs to  enter [employer] of Steps: 6 Entrance Stairs-Rails: Right Home Layout: One level  Lives With:  (Patientis suposed  to have aid coming 7/week ,but they don't always show up) IADL History Homemaking Responsibilities: Yes Meal Prep Responsibility: Primary Laundry Responsibility: Primary Cleaning Responsibility: Primary Bill Paying/Finance Responsibility: Primary Shopping Responsibility: Secondary Current License: No Mode of Transportation: Family;Friends Occupation: On disability Leisure and Hobbies: choir at  church, bowled 3 years ago Prior Function Level of Independence: Independent with gait;Independent with basic ADLs  Able to Take Stairs?: Reciprically Driving: No Leisure: Hobbies-yes (Comment) Comments: Singing, traveling , bowling,puzzles ADL   Vision/Perception  Vision- History Baseline Vision/History: Wears glasses Wears Glasses: At all times Patient Visual Report: Blurring of vision Vision- Assessment Eye Alignment: Within Functional Limits Additional Comments:  (C/o white lights out side of eyes) Perception Perception: Within Functional Limits Praxis Praxis: Intact  Cognition Overall Cognitive Status: Within Functional Limits for tasks assessed Arousal/Alertness: Awake/alert Orientation Level: Oriented X4 Attention: Focused;Selective Selective Attention: Appears intact Memory: Appears intact Awareness: Appears intact Problem Solving: Appears intact Safety/Judgment: Appears intact Sensation Sensation Light Touch: Impaired by gross assessment Light Touch Impaired Details: Impaired RLE Stereognosis: Impaired by gross assessment Hot/Cold: Impaired by gross assessment Proprioception: Impaired by gross assessment Coordination Gross Motor Movements are Fluid and Coordinated: Yes Fine Motor Movements are Fluid and Coordinated: No Coordination and Movement Description:  (Pt. primarliy uses RUE and LUE as active assist) Motor  Motor Motor: Within  Functional Limits Mobility  Bed Mobility Bed Mobility: Rolling Right;Rolling Left;Right Sidelying to Sit Rolling Right: 4: Min assist Rolling Right Details: Visual cues/gestures for precautions/safety Right Sidelying to Sit: 5: Supervision Right Sidelying to Sit Details: Visual cues/gestures  for precautions/safety Supine to Sit: 5: Supervision Transfers Sit to Stand: 4: Min assist Sit to Stand Details: Verbal cues for technique Stand to Sit: 4: Min assist  Trunk/Postural Assessment  Cervical Assessment Cervical Assessment: Within Functional Limits Thoracic Assessment Thoracic Assessment: Within Functional Limits Lumbar Assessment Lumbar Assessment: Within Functional Limits Postural Control Postural Control: Within Functional Limits  Balance Balance Balance Assessed: Yes Dynamic Sitting Balance Dynamic Sitting - Level of Assistance: 5: Stand by assistance Dynamic Standing Balance Dynamic Standing - Level of Assistance: 5: Stand by assistance Extremity/Trunk Assessment RUE Assessment RUE Assessment: Within Functional Limits LUE Assessment LUE Assessment: Exceptions to WFL LUE AROM (degrees) LUE Overall AROM Comments:  (Pt has CTS with LUE and 8/10 pain scale)  FIM:  FIM - Grooming Grooming Steps: Wash, rinse, dry face;Wash, rinse, dry hands Grooming: 4: Patient completes 3 of 4 or 4 of 5 steps FIM - Bathing Bathing Steps Patient Completed: Chest;Right Arm;Left Arm Bathing: 4: Min-Patient completes 8-9 24f10 parts or 75+ percent FIM - Upper Body Dressing/Undressing Upper body dressing/undressing steps patient completed: Thread/unthread right bra strap;Thread/unthread left bra strap;Hook/unhook bra;Thread/unthread right sleeve of pullover shirt/dresss;Thread/unthread left sleeve of pullover shirt/dress;Put head through opening of pull over shirt/dress;Pull shirt over trunk Upper body dressing/undressing: 4: Min-Patient completed 75 plus % of tasks FIM - Lower Body  Dressing/Undressing Lower body dressing/undressing steps patient completed: Thread/unthread right underwear leg;Thread/unthread left underwear leg;Pull underwear up/down;Thread/unthread right pants leg;Thread/unthread left pants leg;Pull pants up/down;Fasten/unfasten pants;Don/Doff right sock;Don/Doff left sock;Don/Doff right shoe;Don/Doff left shoe Lower body dressing/undressing: 4: Min-Patient completed 75 plus % of tasks FIM - BEngineer, siteAssistive Devices:  (HHA from bed to sink  (min assist)) Bed/Chair Transfer: 5: Supine > Sit: Supervision (verbal cues/safety issues);4: Bed > Chair or W/C: Min A (steadying Pt. > 75%)   Refer to Care Plan for Long Term Goals  Recommendations for other services: None  Discharge Criteria: Patient will be discharged from OT if patient refuses treatment 3 consecutive times without medical reason, if treatment goals not met, if there is a change in medical status, if patient makes no progress towards goals or if patient is discharged from hospital.  The above assessment, treatment plan, treatment alternatives and goals were discussed and mutually agreed upon: by patient  ELisa Roca7/12/2013, 5:24 PM

## 2013-08-16 ENCOUNTER — Inpatient Hospital Stay (HOSPITAL_COMMUNITY): Payer: Medicare HMO | Admitting: *Deleted

## 2013-08-16 LAB — GLUCOSE, CAPILLARY
Glucose-Capillary: 109 mg/dL — ABNORMAL HIGH (ref 70–99)
Glucose-Capillary: 87 mg/dL (ref 70–99)
Glucose-Capillary: 74 mg/dL (ref 70–99)
Glucose-Capillary: 148 mg/dL — ABNORMAL HIGH (ref 70–99)

## 2013-08-16 MED ORDER — PREGABALIN 50 MG PO CAPS
100.0000 mg | ORAL_CAPSULE | Freq: Three times a day (TID) | ORAL | Status: DC
  Administered 2013-08-16 – 2013-08-22 (×19): 100 mg via ORAL
  Filled 2013-08-16 (×20): qty 2

## 2013-08-16 NOTE — Progress Notes (Signed)
Slept good, up couple times to urinate. Complained of "burning" pain to RLE, scheduled lyrica given at 2048. Also complained of LUE pain-PRN percocet given, at 2141 ith relief. Dressing intact to chest, loop recorder site. Difficult to understand at times, R/T stuttering. Kara MartinezMurray, Oktober Glazer A

## 2013-08-16 NOTE — Progress Notes (Signed)
Occupational Therapy Session Note  Patient Details  Name: Kara Mendoza MRN: [id number] Date of Birth: 11-04-1950  Today's Date: 08/16/2013 Time:  -   1015-1130  (75 )      Short Term Goals: Week 1:  OT Short Term Goal 1 (Week 1): STG=LTG (STG= LTG)  Skilled Therapeutic Interventions/Progress Updates:    Pt. Had gathered clothes before OT arrived.  Ambulated totoilet and then to bathtub/shower in ADL apt.  Instructed pt about sitting on bench and crossing over, but she stepped over side of shower with grab bars and SBA.  Bathed self and dressed self with increased time.  Needed min assist with straightening bra otherwise mod I with dressing. Ambulated back to room Left in room in wc with NT.  Pt voiced frustration about going back on steroids for her LUE pain. She stated she had lived with the pain for 9 months and wanted to have the surgery and get it taken care of so she could on with living without pain.   Informed RN who stated that treating with steroids was not the treatment plan.  Asked RN to reiterate it to pt     Therapy Documentation Precautions:  Precautions Precautions: Fall Precaution Comments: LUE pain (8/10; sharp from hand to deltoid) Restrictions Weight Bearing Restrictions: No      Pain:  LUE 8/10    See FIM for current functional status  Therapy/Group: Individual Therapy  Humberto Sealsdwards, Kara Mendoza J 08/16/2013, 10:23 AM

## 2013-08-16 NOTE — Progress Notes (Signed)
[place] [facility]     PROGRESS NOTE    Subjective/Complaints: No specific issues. Left arm remains sensitive. Right thigh remains tender as well.  ----she is a VERY POOR historian and ROS limited by cognition/speech  Objective: Vital Signs: Blood pressure 116/61, pulse 71, temperature 97.9 F (36.6 C), temperature source Oral, resp. rate 18, height 5\' 2"  (1.575 m), weight 74.254 kg (163 lb 11.2 oz), SpO2 97.00%. No results found. No results found for this basename: WBC, HGB, HCT, PLT,  in the last 72 hours  Recent Labs  08/14/13 1320  NA 140  K 4.9  CL 99  GLUCOSE 137*  BUN 13  CREATININE 0.75  CALCIUM 10.0   CBG (last 3)   Recent Labs  08/15/13 1621 08/15/13 2104 08/16/13 0710  GLUCAP 117* 106* 109*    Wt Readings from Last 3 Encounters:  08/14/13 74.254 kg (163 lb 11.2 oz)  08/09/13 78.7 kg (173 lb 8 oz)  08/09/13 78.7 kg (173 lb 8 oz)    Physical Exam:  General: no distress.  Eyes: EOM are normal.  Mouth: oral mucosa pink/moist  Neck: Normal range of motion. Neck supple. No thyromegaly present.  Cardiovascular: Normal rate and regular rhythm. No murmurs  Respiratory: Effort normal and breath sounds normal. No respiratory distress. No wheezes  GI: Soft. Bowel sounds are normal. She exhibits no distension.  Musculoskeletal:  Noted hypersensitivity to left forearm to palpation and simple ROM--allodynia   Neurological: She is alert and oriented x 3. Reasonable insight and awareness. Follows all simple commands. Stuttering speech is persistent. LUE grossly 2+/5 with pain still an issue. LLE is 3 to 4-/5 prox to distal. Sensation 1/2 LUE. Distal sensory loss to LT in both legs below the knees but inconsistent. Right thigh with subjective sensory loss. Speech dysarthric. Left facial weakness.  Psych:minimal anxiety, pleasant and cooperative.   Assessment/Plan: 1. Functional deficits secondary to left brain infarcts which require 3+  hours per day of interdisciplinary therapy in a comprehensive inpatient rehab setting. Physiatrist is providing close team supervision and 24 hour management of active medical problems listed below. Physiatrist and rehab team continue to assess barriers to discharge/monitor patient progress toward functional and medical goals. FIM: FIM - Bathing Bathing Steps Patient Completed: Chest;Right Arm;Left Arm Bathing: 4: Min-Patient completes 8-9 63f 10 parts or 75+ percent  FIM - Upper Body Dressing/Undressing Upper body dressing/undressing steps patient completed: Thread/unthread right bra strap;Thread/unthread left bra strap;Hook/unhook bra;Thread/unthread right sleeve of pullover shirt/dresss;Thread/unthread left sleeve of pullover shirt/dress;Put head through opening of pull over shirt/dress;Pull shirt over trunk Upper body dressing/undressing: 4: Min-Patient completed 75 plus % of tasks FIM - Lower Body Dressing/Undressing Lower body dressing/undressing steps patient completed: Thread/unthread right underwear leg;Thread/unthread left underwear leg;Pull underwear up/down;Thread/unthread right pants leg;Thread/unthread left pants leg;Pull pants up/down;Fasten/unfasten pants;Don/Doff right sock;Don/Doff left sock;Don/Doff right shoe;Don/Doff left shoe Lower body dressing/undressing: 4: Min-Patient completed 75 plus % of tasks  FIM - Toileting Toileting steps completed by patient: Adjust clothing prior to toileting;Adjust clothing after toileting;Performs perineal hygiene Toileting Assistive Devices: Grab bar or rail for support Toileting: 4: Steadying assist  FIM - Diplomatic Services operational officer Devices: Grab bars;Elevated toilet seat  FIM - Banker Devices: Teacher, music: 4: Supine > Sit: Min A (steadying Pt. > 75%/lift 1 leg);4: Bed > Chair or W/C: Min A (steadying Pt. > 75%)  FIM - Locomotion: Wheelchair Distance:  150 Locomotion: Wheelchair: 4: Travels 150 ft or more: maneuvers  on rugs and over door sillls with minimal assistance (Pt.>75%) FIM - Locomotion: Ambulation Locomotion: Ambulation Assistive Devices: Occupational hygienistCane - Quad Ambulation/Gait Assistance: 4: Min assist Locomotion: Ambulation: 2: Travels 50 - 149 ft with minimal assistance (Pt.>75%)  Comprehension Comprehension Mode: Auditory Comprehension: 5-Follows basic conversation/direction: With extra time/assistive device  Expression Expression Mode: Verbal Expression: 3-Expresses basic 50 - 74% of the time/requires cueing 25 - 50% of the time. Needs to repeat parts of sentences.  Social Interaction Social Interaction: 5-Interacts appropriately 90% of the time - Needs monitoring or encouragement for participation or interaction.  Problem Solving Problem Solving: 5-Solves basic problems: With no assist  Memory Memory: 6-More than reasonable amt of time  Medical Problem List and Plan:  1. Functional deficits secondary to left brain infarcts with word finding deficits, weakness. Status post loop recorder to establish etiology of infarct   -some of deficits are old including her stuttering speech 2. DVT Prophylaxis/Anticoagulation:Left upper extremity doppler negative. Subcutaneous Lovenox. Monitor platelet counts and any signs of bleeding  3. Pain Management, left arm pain  -pt was seen by neurology in November who felt LUE pain was "musculoskeletal"  -it is entirely unclear when this began---some reports indicate 2008 while others state it's more recently. The patient seems to indicate that it began more recently (POOR HISTORIAN HOWEVER)(also with right thigh pain)  -it appears to me to be consistent with CRPS. Not a great steroid candidate due to diabetes  -will titrate lyrica further up to 100mg  tid, low dose nortriptyline added. Consider SSNRI  -I don't see that she's had imaging of C-spine. This may be helpful also  - Oxycodone as needed.  Monitor with increased mobility  4. Mood/depression/anxiety: Patient on Zoloft 50 mg each bedtime prior to admission  5. Neuropsych: This patient is capable of making decisions on her own behalf.  6. Diabetes mellitus with peripheral neuropathy. Hemoglobin A1c 5.8. Check blood sugars a.c. and at bedtime. Sliding scale insulin. Patient on Glucophage 500 mg daily prior to admission. Good control at present 7. Hypertension. Cozaar 50 mg daily. Monitor with increased mobility. Patient had been on Hyzaar 100-25 daily prior to admission.  8. Hyperthyroidism. Continue Tapazole. TSH level 3.400  9. Hyperlipidemia. Zocor  LOS (Days) 2 A FACE TO FACE EVALUATION WAS PERFORMED  Tadashi Burkel T 08/16/2013 7:33 AM

## 2013-08-17 ENCOUNTER — Inpatient Hospital Stay (HOSPITAL_COMMUNITY): Payer: Medicare HMO | Admitting: Occupational Therapy

## 2013-08-17 ENCOUNTER — Inpatient Hospital Stay (HOSPITAL_COMMUNITY): Payer: Medicare HMO | Admitting: Physical Therapy

## 2013-08-17 ENCOUNTER — Inpatient Hospital Stay (HOSPITAL_COMMUNITY): Payer: Medicare HMO | Admitting: Speech Pathology

## 2013-08-17 ENCOUNTER — Encounter (HOSPITAL_COMMUNITY): Payer: Medicare HMO | Admitting: Occupational Therapy

## 2013-08-17 DIAGNOSIS — I10 Essential (primary) hypertension: Secondary | ICD-10-CM

## 2013-08-17 DIAGNOSIS — G90519 Complex regional pain syndrome I of unspecified upper limb: Secondary | ICD-10-CM

## 2013-08-17 DIAGNOSIS — I6330 Cerebral infarction due to thrombosis of unspecified cerebral artery: Secondary | ICD-10-CM

## 2013-08-17 LAB — COMPREHENSIVE METABOLIC PANEL
ALT: 16 U/L (ref 0–35)
AST: 12 U/L (ref 0–37)
Albumin: 3.3 g/dL — ABNORMAL LOW (ref 3.5–5.2)
Alkaline Phosphatase: 58 U/L (ref 39–117)
Anion gap: 13 (ref 5–15)
BUN: 13 mg/dL (ref 6–23)
CO2: 25 mEq/L (ref 19–32)
Calcium: 9.5 mg/dL (ref 8.4–10.5)
Chloride: 101 mEq/L (ref 96–112)
Creatinine, Ser: 0.75 mg/dL (ref 0.50–1.10)
GFR calc Af Amer: >90 mL/min (ref >90)
GFR calc non Af Amer: 88 mL/min — ABNORMAL LOW (ref >90)
Glucose, Bld: 127 mg/dL — ABNORMAL HIGH (ref 70–99)
Potassium: 4.2 mEq/L (ref 3.7–5.3)
Sodium: 139 mEq/L (ref 137–147)
Total Bilirubin: 0.2 mg/dL — ABNORMAL LOW (ref 0.3–1.2)
Total Protein: 6.4 g/dL (ref 6.0–8.3)

## 2013-08-17 LAB — CBC WITH DIFFERENTIAL/PLATELET
Basophils Absolute: 0 10*3/uL (ref 0.0–0.1)
Basophils Relative: 1 % (ref 0–1)
Eosinophils Absolute: 0.2 10*3/uL (ref 0.0–0.7)
Eosinophils Relative: 3 % (ref 0–5)
HCT: 39.5 % (ref 36.0–46.0)
Hemoglobin: 12.8 g/dL (ref 12.0–15.0)
Lymphocytes Relative: 37 % (ref 12–46)
Lymphs Abs: 2.5 10*3/uL (ref 0.7–4.0)
MCH: 29.1 pg (ref 26.0–34.0)
MCHC: 32.4 g/dL (ref 30.0–36.0)
MCV: 89.8 fL (ref 78.0–100.0)
Monocytes Absolute: 0.5 10*3/uL (ref 0.1–1.0)
Monocytes Relative: 7 % (ref 3–12)
Neutro Abs: 3.4 10*3/uL (ref 1.7–7.7)
Neutrophils Relative %: 52 % (ref 43–77)
Platelets: 294 10*3/uL (ref 150–400)
RBC: 4.4 MIL/uL (ref 3.87–5.11)
RDW: 12.9 % (ref 11.5–15.5)
WBC: 6.6 10*3/uL (ref 4.0–10.5)

## 2013-08-17 LAB — GLUCOSE, CAPILLARY
Glucose-Capillary: 106 mg/dL — ABNORMAL HIGH (ref 70–99)
Glucose-Capillary: 102 mg/dL — ABNORMAL HIGH (ref 70–99)
Glucose-Capillary: 111 mg/dL — ABNORMAL HIGH (ref 70–99)
Glucose-Capillary: 116 mg/dL — ABNORMAL HIGH (ref 70–99)

## 2013-08-17 NOTE — Progress Notes (Signed)
Ambulating with quad cane. Complains of pain to LUE and RLE. LUE-red, swollen and tender to touch. At 2230 heard patient yelling out, reports "burning" pain to right thigh, increased when coughing. Slurred speech, difficult to understand at times. Alfredo MartinezMurray, Ilian Wessell A

## 2013-08-17 NOTE — Progress Notes (Signed)
Patient information reviewed and entered into eRehab system by Notnamed Croucher, RN, CRRN, PPS Coordinator.  Information including medical coding and functional independence measure will be reviewed and updated through discharge.     Per nursing patient was given "Data Collection Information Summary for Patients in Inpatient Rehabilitation Facilities with attached "Privacy Act Statement-Health Care Records" upon admission.  

## 2013-08-17 NOTE — Progress Notes (Signed)
Physical Medicine and Rehabilitation Consult  Reason for Consult: CVA  Referring Physician: [facility]  HPI: Kara Mendoza is a 63 y.o. 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 Patient independent with a cane and walker prior to admission living alone. Admitted 08/09/2013 with increased aphasia. MRI of the brain showed extension of acute infarcts left parietal operculum, left insula and inferior parietal lobes. MRA of the head without occlusion.  CTA of head and neck negative. Echocardiogram with ejection fraction of 60% no wall motion abnormalities. Patient did not receive TPA. Neurology followup currently continues on Plavix for CVA prophylaxis as well as subcutaneous Lovenox for DVT prophylaxis. Physical therapy evaluation 08/10/2013 with recommendations of physical medicine rehabilitation consult.  Review of Systems  Constitutional: Positive for malaise/fatigue.  Gastrointestinal: Positive for constipation.  Musculoskeletal:  Chronic left arm pain  Neurological: Positive for speech change, weakness and headaches.  Psychiatric/Behavioral: Positive for depression.  Anxiety  All other systems reviewed and are negative.   Past Medical History   Diagnosis  Date   .  Stroke    .  Neuromuscular disorder    .  Hypertension    .  Hyperlipidemia    .  Asthma    .  Diabetes mellitus    .  Hyperthyroidism    .  Esophageal reflux    .  Constipation    .  Major depressive disorder, recurrent episode, moderate    .  Anxiety state, unspecified    .  History of fall    .  Abnormality of gait    .  Chronic pain syndrome    .  Tobacco use disorder    .  Osteoarthritis    .  Dysarthria    .  Acute bronchitis    .  Chronic arm pain  2008     left   .  Chronic leg pain  2008     left   .  Fatigue    .  Chronic  headache     Past Surgical History   Procedure  Laterality  Date   .  Cesarean section       x3   .  Finger surgery      Family History   Problem  Relation  Age of Onset   .  Diabetes  Sister    .  Cancer  Father    .  Diabetes  Brother     Social History: reports that she has been smoking Cigarettes. She has a 3 pack-year smoking history. She has never used smokeless tobacco. She reports that she does not drink alcohol or use illicit drugs.  Allergies:  Allergies   Allergen  Reactions   .  Aspirin  Other (See Comments)     Possibly itching and "feeling little weird"   .  Penicillins  Hives    Medications Prior to Admission   Medication  Sig  Dispense  Refill   .  albuterol (PROVENTIL HFA;VENTOLIN HFA) 108 (90 BASE) MCG/ACT inhaler  Inhale 3 puffs into the lungs every 6 (six) hours as needed for wheezing or shortness of breath.     .  buprenorphine (BUTRANS) 10 MCG/HR PTWK patch  Place 10 mcg onto the skin once a week.     .  meloxicam (MOBIC) 7.5 MG tablet  Take 7.5 mg by mouth daily.     Marland Kitchen  metFORMIN (GLUCOPHAGE) 500 MG tablet  Take 500 mg by mouth daily.     Marland Kitchen  oxyCODONE-acetaminophen (PERCOCET) 10-325 MG per tablet  Take 1 tablet by mouth every 6 (six) hours as needed. For pain     .  oxyCODONE-acetaminophen (PERCOCET/ROXICET) 5-325 MG per tablet  Take 1 tablet by mouth every 6 (six) hours as needed for severe pain.     Marland Kitchen  sertraline (ZOLOFT) 50 MG tablet  Take 1 tablet by mouth at bedtime.     .  Tapentadol HCl (NUCYNTA) 100 MG TABS  Take 50 mg by mouth 2 (two) times daily.     .  clopidogrel (PLAVIX) 75 MG tablet  Take 1 tablet by mouth daily.     .  diclofenac (VOLTAREN) 75 MG EC tablet  Take 75 mg by mouth 2 (two) times daily.     .  diclofenac sodium (VOLTAREN) 1 % GEL  Apply 4 g topically 4 (four) times daily as needed.     .  hydrochlorothiazide (HYDRODIURIL) 50 MG tablet  Take 50 mg by mouth daily.     Marland Kitchen  losartan (COZAAR) 100 MG tablet  Take 1 tablet by mouth daily.      Marland Kitchen  losartan-hydrochlorothiazide (HYZAAR) 100-25 MG per tablet  Take 1 tablet by mouth daily.     .  methimazole (TAPAZOLE) 5 MG tablet  Take 5 mg by mouth daily.     .  Multiple Vitamins-Calcium (ONE-A-DAY WOMENS FORMULA PO)  Take 1 tablet by mouth daily.     Marland Kitchen  omeprazole (PRILOSEC) 20 MG capsule  Take 20 mg by mouth 2 (two) times daily.     .  pregabalin (LYRICA) 50 MG capsule  Take 50 mg by mouth 3 (three) times daily.     .  sennosides-docusate sodium (SENOKOT-S) 8.6-50 MG tablet  Take 1 tablet by mouth daily.     .  simvastatin (ZOCOR) 20 MG tablet  Take 10 mg by mouth at bedtime. 1/2 tab qd     .  zolpidem (AMBIEN) 5 MG tablet  Take 5 mg by mouth at bedtime.      Home:  Home Living  Family/patient expects to be discharged to:: Private residence  Living Arrangements: Alone  Available Help at Discharge: Family;Personal care attendant  Type of Home: House  Home Access: Stairs to enter  [employer] of Steps: 6  Entrance Stairs-Rails: Right  Home Layout: One level  Home Equipment: Walker - 2 wheels;Cane - single point  Functional History:  Prior Function  Level of Independence: Needs assistance  Gait / Transfers Assistance Needed: uses cane, but has not previously needed physical support  ADL's / Homemaking Assistance Needed: Nurse aide assistance 2 hours a day to help with B/D since about 9 months ago when she started having pain in her LUE (which has continued to get worse), up until then the aid mainly helped her around the house for IADLs  Functional Status:  Mobility:  Bed Mobility  Overal bed mobility: Needs Assistance  Bed Mobility: Supine to Sit  Supine to sit: Min guard (with heavy use of the rail)  General bed mobility comments: heavy use of the rail  Transfers  Overall transfer level: Needs assistance  Equipment used: 1 person hand held assist;Quad cane  Transfers: Sit to/from Stand  Sit to Stand: Min assist  General transfer comment: slow and guarded   Ambulation/Gait  Ambulation/Gait assistance: Min assist  Ambulation Distance (Feet): 230 Feet  Assistive device:  Quad cane  Gait Pattern/deviations: Step-through pattern  Gait velocity: slower  General Gait Details: mildly unsteady with quad cane and mildly paretic on the left   ADL:  ADL  Overall ADL's : Needs assistance/impaired  Eating/Feeding: Set up;Sitting  Grooming: Minimal assistance;Sitting  Upper Body Bathing: Minimal assitance;Sitting  Lower Body Bathing: Moderate assistance;Sit to/from stand  Upper Body Dressing : Moderate assistance;Sitting  Lower Body Dressing: Maximal assistance;Sit to/from stand  Toilet Transfer: Minimal assistance;Ambulation (one person HHA (due to could not use RW due to decreased use of LUE); bed>around to other side>back around and sit where we startd)  Toileting- Clothing Manipulation and Hygiene: Total assistance;Sit to/from stand  General ADL Comments: Pt with minimal edema Lt. hand when compared to Rt. Pt with complaint of pain Lt hand to shoulder. Unable to describe, but when asked, she endorses sharp, burning sensation. Pt is guarded with movment, but demonstrates isolated movement throughout Lt. UE. Pt performe shoulder flexion and abduction to ~90-100 x 8 reps with encouragement. Requires increased time. When asked to make a fist, pt flexes at the PIPs. difficult to get her to flex MCPs, however, when she engages in functional tasks full fist noted. Pt winces and cries with the slightest touch to Lt. UE however, when she was showing therapist her palms, she reached with Lt. UE to rub Rt palm with no guarding or indication of pain - fluid movement noted. She also did the same when adjusting wrist bracelet on Lt. UE.  Cognition:  Cognition  Overall Cognitive Status: Within Functional Limits for tasks assessed  Arousal/Alertness: Awake/alert  Orientation Level: Oriented X4 (Simultaneous filing. User may not have seen previous data.)  Attention:  Selective  Selective Attention: Appears intact  Memory: Appears intact  Awareness: Appears intact  Problem Solving: Appears intact  Safety/Judgment: Appears intact  Cognition  Arousal/Alertness: Awake/alert  Behavior During Therapy: WFL for tasks assessed/performed  Overall Cognitive Status: Within Functional Limits for tasks assessed  Blood pressure 133/58, pulse 68, temperature 97.8 F (36.6 C), temperature source Axillary, resp. rate 18, height 5\' 2"  (1.575 m), weight 78.7 kg (173 lb 8 oz), SpO2 96.00%.  Physical Exam  Eyes: EOM are normal.  Neck: Normal range of motion. Neck supple. No thyromegaly present.  Cardiovascular: Normal rate and regular rhythm.  Respiratory: Effort normal and breath sounds normal. No respiratory distress.  GI: Soft. Bowel sounds are normal. She exhibits no distension.  Musculoskeletal:  Noted hypersensitivity to left forearm to palpation she is quite guarded  Neurological: She is alert.  Patient is a bit anxious and makes good eye contact with examiner. Stuttering speech. LUE grossly 2 to 2+/5. Pain inhibition component. LLE is 3 to 4-/5 prox to distal. Sensation 1/2 LUE with dyesthesisas. Speech dysarthric. Left facial weakness also.   she does mix with aphasia as well as a significant stutter and word finding difficulties. She did follow simple commands.  Results for orders placed during the hospital encounter of 08/09/13 (from the past 24 hour(s))   GLUCOSE, CAPILLARY Status: Abnormal    Collection Time    08/10/13 11:57 AM   Result  Value  Ref Range    Glucose-Capillary  105 (*)  70 - 99 mg/dL   GLUCOSE, CAPILLARY Status: None    Collection Time    08/10/13 4:41 PM   Result  Value  Ref Range    Glucose-Capillary  93  70 - 99 mg/dL   GLUCOSE, CAPILLARY Status: Abnormal    Collection Time  08/10/13 9:33 PM   Result  Value  Ref Range    Glucose-Capillary  161 (*)  70 - 99 mg/dL    Comment 1  Notify RN     Comment 2  Documented in Chart     CBC Status: None    Collection Time    08/11/13 4:20 AM   Result  Value  Ref Range    WBC  7.7  4.0 - 10.5 K/uL    RBC  4.67  3.87 - 5.11 MIL/uL    Hemoglobin  13.7  12.0 - 15.0 g/dL    HCT  16.1  09.6 - 04.5 %    MCV  88.4  78.0 - 100.0 fL    MCH  29.3  26.0 - 34.0 pg    MCHC  33.2  30.0 - 36.0 g/dL    RDW  40.9  81.1 - 91.4 %    Platelets  321  150 - 400 K/uL   BASIC METABOLIC PANEL Status: Abnormal    Collection Time    08/11/13 4:20 AM   Result  Value  Ref Range    Sodium  142  137 - 147 mEq/L    Potassium  4.0  3.7 - 5.3 mEq/L    Chloride  103  96 - 112 mEq/L    CO2  26  19 - 32 mEq/L    Glucose, Bld  111 (*)  70 - 99 mg/dL    BUN  11  6 - 23 mg/dL    Creatinine, Ser  7.82  0.50 - 1.10 mg/dL    Calcium  9.8  8.4 - 10.5 mg/dL    GFR calc non Af Amer  76 (*)  >90 mL/min    GFR calc Af Amer  88 (*)  >90 mL/min    Anion gap  13  5 - 15   HEPATIC FUNCTION PANEL Status: None    Collection Time    08/11/13 4:20 AM   Result  Value  Ref Range    Total Protein  6.6  6.0 - 8.3 g/dL    Albumin  3.7  3.5 - 5.2 g/dL    AST  14  0 - 37 U/L    ALT  15  0 - 35 U/L    Alkaline Phosphatase  59  39 - 117 U/L    Total Bilirubin  0.6  0.3 - 1.2 mg/dL    Bilirubin, Direct  <9.5  0.0 - 0.3 mg/dL    Indirect Bilirubin  NOT CALCULATED  0.3 - 0.9 mg/dL   GLUCOSE, CAPILLARY Status: Abnormal    Collection Time    08/11/13 7:05 AM   Result  Value  Ref Range    Glucose-Capillary  109 (*)  70 - 99 mg/dL    Comment 1  Notify RN     Comment 2  Documented in Chart     Ct Angio Head W/cm &/or Wo Cm  08/09/2013 CLINICAL DATA: exprssive aphasia EXAM: CT ANGIOGRAPHY HEAD AND NECK TECHNIQUE: Multidetector CT imaging of the head and neck was performed using the standard protocol during bolus administration of intravenous contrast. Multiplanar CT image reconstructions and MIPs were obtained to evaluate the vascular anatomy. Carotid stenosis measurements (when applicable) are obtained utilizing NASCET  criteria, using the distal internal carotid diameter as the denominator. CONTRAST: OMNIPAQUE IOHEXOL 350 MG/ML SOLN COMPARISON: Head CT same day. MRI 05/17/2006. FINDINGS: CTA HEAD FINDINGS Both internal carotid arteries are widely patent through the  siphon regions. No supra clinoid stenosis. The anterior and middle cerebral vessels appear normal without proximal stenosis, aneurysm or vascular malformation. Both vertebral arteries are patent to the basilar with the left being a very small vessel. No basilar stenosis. Posterior circulation branch vessels appear patent and normal. Intracranial venous structures are patent. Chronic ischemic changes are seen at the temporoparietal junction region on the left. Review of the MIP images confirms the above findings. CTA NECK FINDINGS Branching pattern of the brachiocephalic vessels from the arch is normal. No origin stenoses. Lung apices are clear. Both common carotid arteries are widely patent to their respective bifurcation. Both carotid bifurcations appear normal without evidence of stenosis or irregularity. Both cervical internal carotid arteries are normal. Both vertebral artery origins are widely patent. The right vertebral artery is larger than the left. Both vessels are patent through the cervical region. Review of the MIP images confirms the above findings. IMPRESSION: Negative study. No evidence of atherosclerotic narrowing or dissection. No vessel occlusion. No aneurysm or vascular malformation. Electronically Signed By: Paulina Fusi M.D. On: 08/09/2013 19:01  Dg Chest 2 View  08/09/2013 CLINICAL DATA: Stroke, hypertension, asthma. EXAM: CHEST 2 VIEW COMPARISON: 04/15/2012 FINDINGS: The heart size and mediastinal contours are within normal limits. Both lungs are clear. The visualized skeletal structures are unremarkable. IMPRESSION: No active cardiopulmonary disease. Electronically Signed By: Charlett Nose M.D. On: 08/09/2013 19:00  Ct Head Wo Contrast   08/09/2013 CLINICAL DATA: New expressive aphasia, hx of CVA in the past EXAM: CT HEAD WITHOUT CONTRAST TECHNIQUE: Contiguous axial images were obtained from the base of the skull through the vertex without intravenous contrast. COMPARISON: Head CT 04/20/2008 FINDINGS: Stable area of chronic left temporoparietal infarction. No acute intracranial abnormality. Specifically, no hemorrhage, hydrocephalus, mass lesion, acute infarction, or significant intracranial injury. No acute calvarial abnormality. Visualized paranasal sinuses and mastoid air cells are patent. IMPRESSION: Stable chronic left MCA distribution infarction. No acute intracranial abnormality. Electronically Signed By: Salome Holmes M.D. On: 08/09/2013 17:05  Ct Angio Neck W/cm &/or Wo/cm  08/09/2013 CLINICAL DATA: exprssive aphasia EXAM: CT ANGIOGRAPHY HEAD AND NECK TECHNIQUE: Multidetector CT imaging of the head and neck was performed using the standard protocol during bolus administration of intravenous contrast. Multiplanar CT image reconstructions and MIPs were obtained to evaluate the vascular anatomy. Carotid stenosis measurements (when applicable) are obtained utilizing NASCET criteria, using the distal internal carotid diameter as the denominator. CONTRAST: OMNIPAQUE IOHEXOL 350 MG/ML SOLN COMPARISON: Head CT same day. MRI 05/17/2006. FINDINGS: CTA HEAD FINDINGS Both internal carotid arteries are widely patent through the siphon regions. No supra clinoid stenosis. The anterior and middle cerebral vessels appear normal without proximal stenosis, aneurysm or vascular malformation. Both vertebral arteries are patent to the basilar with the left being a very small vessel. No basilar stenosis. Posterior circulation branch vessels appear patent and normal. Intracranial venous structures are patent. Chronic ischemic changes are seen at the temporoparietal junction region on the left. Review of the MIP images confirms the above findings. CTA NECK  FINDINGS Branching pattern of the brachiocephalic vessels from the arch is normal. No origin stenoses. Lung apices are clear. Both common carotid arteries are widely patent to their respective bifurcation. Both carotid bifurcations appear normal without evidence of stenosis or irregularity. Both cervical internal carotid arteries are normal. Both vertebral artery origins are widely patent. The right vertebral artery is larger than the left. Both vessels are patent through the cervical region. Review of the MIP images confirms the above findings. IMPRESSION:  Negative study. No evidence of atherosclerotic narrowing or dissection. No vessel occlusion. No aneurysm or vascular malformation. Electronically Signed By: Paulina FusiMark Shogry M.D. On: 08/09/2013 19:01  Mr Brain Wo Contrast  08/10/2013 CLINICAL DATA: Stroke EXAM: MRI HEAD WITHOUT CONTRAST MRA HEAD WITHOUT CONTRAST TECHNIQUE: Multiplanar, multiecho pulse sequences of the brain and surrounding structures were obtained without intravenous contrast. Angiographic images of the head were obtained using MRA technique without contrast. COMPARISON: CT 08/09/2013 FINDINGS: MRI HEAD FINDINGS Chronic infarct left parietal cortex. Chronic microvascular ischemic changes are present in the white matter bilaterally. 1 cm area of acute infarct in the left parietal operculum. Additional area of acute infarct in the left insula. Small area of acute infarct in the left lower parietal lobe inferior to the chronic cortical infarct. Negative for hemorrhage or mass. Ventricle size is normal. No shift of the midline structures. MRA HEAD FINDINGS Right vertebral artery is widely patent and is normal. Hypoplastic left vertebral artery is small and irregular but does contribute to the basilar. Basilar widely patent. Superior cerebellar and posterior cerebral arteries are widely patent. Posterior communicating artery is patent bilaterally. Anterior and middle cerebral arteries are widely patent  bilaterally without stenosis. Negative for cerebral aneurysm. IMPRESSION: Chronic left parietal infarct. Chronic microvascular ischemic change. Extension of acute infarct into the left parietal operculum, left insula, and left inferior parietal lobe, surrounding the area of chronic infarction. Small irregular distal left vertebral artery. 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 Electronically Signed By: Marlan Palauharles Clark M.D. On: 08/10/2013 09:14  Mr Maxine GlennMra Head/brain Wo Cm  08/10/2013 CLINICAL DATA: Stroke EXAM: MRI HEAD WITHOUT CONTRAST MRA HEAD WITHOUT CONTRAST TECHNIQUE: Multiplanar, multiecho pulse sequences of the brain and surrounding structures were obtained without intravenous contrast. Angiographic images of the head were obtained using MRA technique without contrast. COMPARISON: CT 08/09/2013 FINDINGS: MRI HEAD FINDINGS Chronic infarct left parietal cortex. Chronic microvascular ischemic changes are present in the white matter bilaterally. 1 cm area of acute infarct in the left parietal operculum. Additional area of acute infarct in the left insula. Small area of acute infarct in the left lower parietal lobe inferior to the chronic cortical infarct. Negative for hemorrhage or mass. Ventricle size is normal. No shift of the midline structures. MRA HEAD FINDINGS Right vertebral artery is widely patent and is normal. Hypoplastic left vertebral artery is small and irregular but does contribute to the basilar. Basilar widely patent. Superior cerebellar and posterior cerebral arteries are widely patent. Posterior communicating artery is patent bilaterally. Anterior and middle cerebral arteries are widely patent bilaterally without stenosis. Negative for cerebral aneurysm. IMPRESSION: Chronic left parietal infarct. Chronic microvascular ischemic change. Extension of acute infarct into the left parietal operculum, left insula, and left inferior  parietal lobe, surrounding the area of chronic infarction. Small irregular distal left vertebral artery. 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 Electronically Signed By: Marlan Palauharles Clark M.D. On: 08/10/2013 09:14   Assessment/Plan:  Diagnosis: left brain infarcts with word finding deficits, weakness  1. Does the need for close, 24 hr/day medical supervision in concert with the patient's rehab needs make it unreasonable for this patient to be served in a less intensive setting? Yes 2. Co-Morbidities requiring supervision/potential complications: htn, post-stroke sequelae, pain 3. Due to bladder management, bowel management, safety, skin/wound care, disease management, medication administration, pain management and patient education, does  the patient require 24 hr/day rehab nursing? Yes 4. Does the patient require coordinated care of a physician, rehab nurse, PT (1-2 hrs/day, 5 days/week), OT (1-2 hrs/day, 5 days/week) and SLP (1-2 hrs/day, 5 days/week) to address physical and functional deficits in the context of the above medical diagnosis(es)? Yes Addressing deficits in the following areas: balance, endurance, locomotion, strength, transferring, bowel/bladder control, bathing, dressing, feeding, grooming, toileting, cognition, speech, language, swallowing and psychosocial support 5. Can the patient actively participate in an intensive therapy program of at least 3 hrs of therapy per day at least 5 days per week? Yes 6. The potential for patient to make measurable gains while on inpatient rehab is excellent 7. Anticipated functional outcomes upon discharge from inpatient rehab are modified independent and supervision with PT, modified independent and supervision with OT, supervision with SLP. 8. Estimated rehab length of stay to reach the above functional goals is: 7-10 days 9. Does the patient have adequate social supports to  accommodate these discharge functional goals? Potentially 10. Anticipated D/C setting: Home 11. Anticipated post D/C treatments: HH therapy 12. Overall Rehab/Functional Prognosis: excellent RECOMMENDATIONS:  This patient's condition is appropriate for continued rehabilitative care in the following setting: CIR  Patient has agreed to participate in recommended program. Yes  Note that insurance prior authorization may be required for reimbursement for recommended care.  Comment: Rehab Admissions Coordinator to follow up. Needs to be Mod I during the day (beyond CNA assist in the AM) to go home.  Thanks,  Ranelle Oyster, MD, [place] [facility]  08/11/2013  Revision History...      Date/Time User Action    08/11/2013 12:03 PM Ranelle Oyster, MD Addend    08/11/2013 10:45 AM Ranelle Oyster, MD Sign    08/11/2013 10:32 AM Charlton Amor, PA-C [place]   View Details Report    Routing History.Marland KitchenMarland Kitchen

## 2013-08-17 NOTE — Progress Notes (Signed)
Occupational Therapy Session Note  Patient Details  Name: Kara Mendoza MRN: [id number] Date of Birth: 1950/12/07  Today's Date: 08/17/2013 Time: [id number] and 1333-1400 Time Calculation (min): 57 min and 27 min  Short Term Goals: Week 1:  OT Short Term Goal 1 (Week 1): STG=LTG (STG= LTG)  Skilled Therapeutic Interventions/Progress Updates:    1) Engaged in ADL retraining with focus on dynamic standing balance, transitional movements, and use of LUE.  Pt reports pain in LUE 5/10 and increased swelling in hand and forearm, RN aware.  Pt with spontaneous use of LUE while gathering clothes and during bathing, post bathing reports slight decrease in swelling.  Educated pt on increasing use of LUE in functional tasks to continue to decrease swelling.  Pt close supervision and occasional min assist during functional ambulation and transfers with quad cane due to decreased sensation and proprioception in RLE.  Pt completed bathing in standing for UB then seated for LB, educated on use of tub bench for energy conservation as well as safety as pt reports occasional light headedness.  Utilized only RUE when donning brief and pants despite some functional use of LUE, however guarding due to pain with all movement.  Pt's main goal reported is to return to functional routine tasks.  Educated on purpose of OT and goals established with pt reporting understanding and motivation.  Issued pt a stress ball to encourage use of LUE as tolerated by pain.  Pt returned to bed to elevate LUE and RN to apply ice.  2) Pt in bed upon arrival.  Bed mobility from hospital bed supervision, upon exiting bed pt required min/steady assist with ambulation to toilet with quad cane with pt with reports of slight light headedness with mobility.  Ambulated to ADL apt with close supervision with pt reporting decrease in light headedness.  Educated on pushing up from couch or bed instead of pulling up on quad cane to increase safety.  Pt  required verbal cues with each sit > stand as decreased recall of hand placement despite education early in session.  Pt guards mobility and ambulation due to pain in LUE which is inhibiting her safety with all mobility.  Discussed energy conservation strategies and safety in kitchen with suggestions to relocate most used items to a place more easily reached.  Again discussed use of tub bench or shower chair for safety in shower at home and energy conservation.  Therapy Documentation Precautions:  Precautions Precautions: Fall Precaution Comments: LUE pain (8/10; sharp from hand to deltoid) Restrictions Weight Bearing Restrictions: No General:   Vital Signs: Therapy Vitals Pulse Rate: 80 BP: 127/77 mmHg Patient Position (if appropriate): Standing (Immediately following sit>stand; reporting lightheadedness) Oxygen Therapy SpO2: 96 % O2 Device: None (Room air) Pain: Pain Assessment Pain Assessment: 0-10 Pain Score: 5  Pain Type: Acute pain Pain Location: Arm Pain Orientation: Left Pain Radiating Towards:  (entire arm) Pain Descriptors / Indicators: Aching;Burning Pain Frequency: Constant Pain Onset: On-going Patients Stated Pain Goal: 3 Pain Intervention(s):  (patient with therapy will elevate and ice once completed) Multiple Pain Sites: No  See FIM for current functional status  Therapy/Group: Individual Therapy  Rosalio LoudHOXIE, Kara Mendoza 08/17/2013, 10:44 AM

## 2013-08-17 NOTE — Progress Notes (Signed)
[facility] Individual Statement of Services  Patient Name:  Kara HammockSheila L Spade  Date:  08/17/2013  [facility] the [facility].  Our goal is to provide you with an individualized program based on your diagnosis and situation, designed to meet your specific needs.  With this comprehensive rehabilitation program, you will be expected to participate in at least 3 hours of rehabilitation therapies Monday-Friday, with modified therapy programming on the weekends.  Your rehabilitation program will include the following services:  Physical Therapy (PT), Occupational Therapy (OT), Speech Therapy (ST), 24 hour per day rehabilitation nursing, Therapeutic Recreation (TR), Neuropsychology, Case Management (Social Worker), Rehabilitation Medicine, Nutrition Services and Pharmacy Services  Weekly team conferences will be held on Wednesdays to discuss your progress.  Your Social Worker will talk with you frequently to get your input and to update you on team discussions.  Team conferences with you and your family in attendance may also be held.  Expected length of stay: 7 to 9 days  Overall anticipated outcome: Modified Independent  Depending on your progress and recovery, your program may change. Your Social Worker will coordinate services and will keep you informed of any changes. Your Social Worker's name and contact numbers are listed  below.  The following services may also be recommended but are not provided by the [facility]:   Driving Evaluations  Home Health Rehabiltiation Services  Outpatient Rehabilitation Services   Arrangements will be made to provide these services after discharge if needed.  Arrangements include referral to agencies that provide these services.  Your insurance has been verified to be:  [employer] and Medicaid Your primary doctor is:  Dr. Fleet ContrasEdwin Avbuere  Pertinent information will be shared with your doctor and  your insurance company.  Social Worker:  Dossie DerBecky Dupree, SW [phone] or ([phone]) [phone]  Information discussed with and copy given to patient by: Elvera LennoxPrevatt, Andree Heeg Capps, 08/17/2013, 12:15 PM

## 2013-08-17 NOTE — Progress Notes (Signed)
PMR Admission Coordinator Pre-Admission Assessment  Patient: Kara Mendoza is an 63 y.o., female  MRN: [id number]  DOB: 05-09-1950  Height: 5' 2"  (157.5 cm)  Weight: 78.7 kg (173 lb 8 oz)  Insurance Information  HMO: Yes PPO: PCP: IPA: 80/20: OTHER: Group # [id number]  PRIMARY: [employer] Policy#: [id number] Subscriber: Karie Kirks  CM Name: Rema Fendt Phone#: [phone] Fax#: [phone]  Pre-Cert#: [id number] precert X 10 days 93/81 to 08/23/13 Employer: Disabled  Benefits: Phone #: [phone] Name: Deveron Furlong. Date: 05/06/13 Deduct: $1260 Out of Pocket Max: [phone] (met $34.27) Life Max: unlimited  CIR: 100% after deductible SNF: $0 days 1-20; $144.50 days 21-100  Outpatient: 80% Co-Pay: 20%  Home Health: 100% Co-Pay: none  DME: 80% Co-Pay: 20%  Providers: in network   SECONDARY: Medicaid of [place] Policy#: [id number] s Subscriber: Karie Kirks  CM Name: Phone#: Fax#:  Pre-Cert#: Employer: Disabled  Benefits: Phone #: [phone] Name: Automated  Eff. Date: Eligible 08/11/13 Deduct: Out of Pocket Max: Life Max:  CIR: SNF:  Outpatient: Co-Pay:  Home Health: Co-Pay:  DME: Co-Pay:  Emergency Contact Information  Contact Information    Name  Relation  Home  Work  Gate  Son    [phone]    Jasiya, Markie    [phone]      Current Medical History  Patient Admitting Diagnosis:Left brain infarcts with word finding deficits, weakness  History of Present Illness: A 64 y.o. 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 Patient independent with a cane and walker prior to admission living alone with a home health aide 2 hours daily. Admitted 08/09/2013 with increased aphasia. MRI of the brain showed extension of acute infarcts left parietal operculum, left insula and  inferior parietal lobes. MRA of the head without occlusion. CTA of head and neck negative. Echocardiogram with ejection fraction of 60% no wall motion abnormalities. Patient did not receive TPA. Neurology followup currently continues on Plavix for CVA prophylaxis as well as subcutaneous Lovenox for DVT prophylaxis. TEE completed that showed no PFO without thrombus and underwent loop recorder implantation 08/12/2013 per Dr. Caryl Comes. Physical therapy evaluation 08/10/2013 with recommendations of physical medicine rehabilitation consult. Patient to be admitted for comprehensive inpatient rehabilitation program.  Total: 3=NIH  Past Medical History  Past Medical History   Diagnosis  Date   .  Neuromuscular disorder    .  Hypertension    .  Hyperlipidemia    .  Asthma    .  Diabetes mellitus    .  Hyperthyroidism    .  Esophageal reflux    .  Constipation    .  Major depressive disorder, recurrent episode, moderate    .  Anxiety state, unspecified    .  History of fall    .  Abnormality of gait    .  Chronic pain syndrome    .  Tobacco use disorder    .  Osteoarthritis    .  Dysarthria    .  Acute bronchitis    .  Chronic arm pain  2008     left   .  Chronic leg pain  2008     left   .  Fatigue    .  Chronic headache    .  Stroke     Family History  family history  includes Cancer in her father; Diabetes in her brother and sister.  Prior Rehab/Hospitalizations: Had CVA in 2008. No therapies since then.  Current Medications  Current facility-administered medications:clopidogrel (PLAVIX) tablet 75 mg, 75 mg, Oral, Daily, Kelvin Cellar, MD, 75 mg at 08/14/13 0844; enoxaparin (LOVENOX) injection 40 mg, 40 mg, Subcutaneous, Q24H, Kelvin Cellar, MD, 40 mg at 08/13/13 2026; insulin aspart (novoLOG) injection 0-15 Units, 0-15 Units, Subcutaneous, TID WC, Kelvin Cellar, MD, 2 Units at 08/13/13 1003  insulin aspart (novoLOG) injection 0-5 Units, 0-5 Units, Subcutaneous, QHS, Kelvin Cellar,  MD; losartan (COZAAR) tablet 50 mg, 50 mg, Oral, Daily, Domenic Polite, MD, 50 mg at 08/14/13 1005; methimazole (TAPAZOLE) tablet 5 mg, 5 mg, Oral, Daily, Kelvin Cellar, MD, 5 mg at 08/14/13 1005; oxyCODONE-acetaminophen (PERCOCET/ROXICET) 5-325 MG per tablet 1 tablet, 1 tablet, Oral, Q8H PRN, Dianne Dun, NP, 1 tablet at 08/13/13 1829  pregabalin (LYRICA) capsule 50 mg, 50 mg, Oral, TID, Stormy Fabian, NP, 50 mg at 08/14/13 1005; senna-docusate (Senokot-S) tablet 1 tablet, 1 tablet, Oral, QHS PRN, Kelvin Cellar, MD; simvastatin (ZOCOR) tablet 40 mg, 40 mg, Oral, QHS, Rosalin Hawking, MD, 40 mg at 08/13/13 2212; zolpidem (AMBIEN) tablet 5 mg, 5 mg, Oral, QHS PRN, Dianne Dun, NP, 5 mg at 08/11/13 2139  Patients Current Diet: Carb Control  Precautions / Restrictions  Precautions  Precautions: Fall  Precaution Comments: LUE pain  Restrictions  Weight Bearing Restrictions: No  Prior Activity Level  Limited Community (1-2x/wk): Went out 1-2 X a week to church, to choir practice or out to get something she needed.  Home Assistive Devices / Equipment  Home Assistive Devices/Equipment: Cane (specify quad or straight) (cane)  Home Equipment: Walker - 2 wheels;Cane - single point  Prior Functional Level  Prior Function  Level of Independence: Needs assistance  Gait / Transfers Assistance Needed: uses cane, but has not previously needed physical support  ADL's / Homemaking Assistance Needed: Nurse aide assistance 2 hours a day to help with B/D since about 9 months ago when she started having pain in her LUE (which has continued to get worse), up until then the aid mainly helped her around the house for IADLs  Current Functional Level  Cognition  Arousal/Alertness: Awake/alert  Overall Cognitive Status: Within Functional Limits for tasks assessed  Orientation Level: Oriented X4  Attention: Selective  Selective Attention: Appears intact  Memory: Appears intact  Awareness:  Appears intact  Problem Solving: Appears intact  Safety/Judgment: Appears intact   Extremity Assessment  (includes Sensation/Coordination)  Upper Extremity Assessment: Defer to OT evaluation  LUE Deficits / Details: Can move all joints however slow and laborous due to pain and not full range due to edema and pain--arm is essentially non-functional  Lower Extremity Assessment: Overall WFL for tasks assessed;LLE deficits/detail  LLE Deficits / Details: grossly >4/5   ADLs  Overall ADL's : Needs assistance/impaired  Eating/Feeding: Set up;Sitting  Grooming: Wash/dry hands;Min guard;Standing  Upper Body Bathing: Minimal assitance;Sitting  Lower Body Bathing: Moderate assistance;Sit to/from stand  Upper Body Dressing : Moderate assistance;Sitting  Lower Body Dressing: Maximal assistance;Sit to/from stand  Toilet Transfer: Ambulation;Min guard;Regular Toilet;Grab bars  Toileting- Water quality scientist and Hygiene: Sit to/from stand;Min guard  Functional mobility during ADLs: Cane;Min guard  General ADL Comments: Reviewed stress loading program with pt and had her demonstrate understanding of it by putting weight through LUE and applying as much pressure as possible onto bath towel; then holding onto the sode can (7.4 oz) in L hand  with elbow extended. Instructed pt to do both exercises for 4 minutes 4 times a day.   Mobility  Overal bed mobility: Needs Assistance  Bed Mobility: Sit to Supine;Supine to Sit  Supine to sit: Supervision  Sit to supine: Supervision  General bed mobility comments: min use of the rail   Transfers  Overall transfer level: Needs assistance  Equipment used: Straight cane  Transfers: Sit to/from Stand  Sit to Stand: Min guard  General transfer comment: initial unsteadiness on standing needing guard   Ambulation / Gait / Stairs / Emergency planning/management officer  Ambulation/Gait  Ambulation/Gait assistance: Min guard;Min Restaurant manager, fast food (Feet): 200 Feet  Assistive  device: Straight cane  Gait Pattern/deviations: Step-through pattern  Gait velocity: slower  Gait velocity interpretation: Below normal speed for age/gender  General Gait Details: generally steady today with infrequent min assist for episodes of instability  Stairs: Yes  Stairs assistance: Min guard  Stair Management: One rail Right;Step to pattern;Forwards;With cane  Number of Stairs: 3  General stair comments: steady with cane   Posture / Balance  Overall balance assessment: Needs assistance  Sitting-balance support: No upper extremity supported;Feet supported  Sitting balance-Leahy Scale: Fair  Standing balance support: Single extremity supported  Standing balance-Leahy Scale: Poor   Special needs/care consideration  BiPAP/CPAP No  CPM No  Continuous Drip IV No  Dialysis No  Life Vest No  Oxygen No  Special Bed No  Trach Size No  Wound Vac (area) No  Skin Has dry skin areas/callus areas on the palms of her hands  Bowel mgmt: Had BM 08/10/13  Bladder mgmt: Voiding in bathroom, problems with incontinence at home and used diapers  Diabetic mgmt Yes, on oral medications at home.   Previous Home Environment  Living Arrangements: Alone  Available Help at Discharge: Family;Personal care attendant  Type of Home: House  Home Layout: One level  Home Access: Stairs to enter  Entrance Stairs-Rails: Right  Entrance Stairs-Number of Steps: 6  Bathroom Shower/Tub: Administrator, Civil Service: [place]: No  Discharge Living Setting  Plans for Discharge Living Setting: Alone;Apartment  Type of Home at Discharge: Apartment  Discharge Home Layout: One level (Apt is a second level apartment with 7-8 step entry.)  Discharge Home Access: Stairs to enter  Entrance Stairs-Number of Steps: 7-8 steps to 2nd level apt.  Does the patient have any problems obtaining your medications?: No  Social/Family/Support Systems  Patient Roles: Parent (Has a son and a daughter.)   Contact Information: Korayma Hagwood daughter or Breena Bevacqua - son  Anticipated Caregiver: self and nurse aide 2 hrs a day  Anticipated Caregiver's Contact Information: Remo Lipps - son [phone] or Cosima Prentiss [phone]  Ability/Limitations of Caregiver: Dtr works days and son also works  Careers adviser: Intermittent  Discharge Plan Discussed with Primary Caregiver: Yes  Is Caregiver In Agreement with Plan?: Yes  Does Caregiver/Family have Issues with Lodging/Transportation while Pt is in Rehab?: No  Goals/Additional Needs  Patient/Family Goal for Rehab: PT/OT Mod I/S, ST supervision goals  Expected length of stay: 7-10 days  Cultural Considerations: Baptist, sings in the choir  Dietary Needs: Carb mod med cal diabetic diet with thin liquids  Equipment Needs: TBD  Pt/Family Agrees to Admission and willing to participate: Yes  Program Orientation Provided & Reviewed with Pt/Caregiver Including Roles & Responsibilities: Yes  Decrease burden of Care through IP rehab admission: N/A  Possible need for SNF placement upon discharge: Yes, if she  cannot progress to mod I where she can stay alone at home. Patient hopes to avoid going to a SNF.  Patient Condition: This patient's medical and functional status has changed since the consult dated:08/11/13 in which the Rehabilitation Physician determined and documented that the patient's condition is appropriate for intensive rehabilitative care in an inpatient rehabilitation facility. See "History of Present Illness" (above) for medical update. Functional changes are: Currently requiring min guard/min assist to ambulate 200 ft [place]. Patient's medical and functional status update has been discussed with the Rehabilitation physician and patient remains appropriate for inpatient rehabilitation. Will admit to inpatient rehab today.  Preadmission Screen Completed By: Retta Diones, 08/14/2013 11:06 AM   ______________________________________________________________________  Discussed status with Dr. Naaman Plummer on 08/14/13 at 1107 and received telephone approval for admission today.  Admission Coordinator: Retta Diones, time1107/Date07/10/15  Cosigned by: Meredith Staggers, MD [08/14/2013 11:13 AM]

## 2013-08-17 NOTE — Plan of Care (Signed)
Problem: RH PAIN MANAGEMENT Goal: RH STG PAIN MANAGED AT OR BELOW PT'S PAIN GOAL Pain less than or equal to level 3  Outcome: Not Progressing Continues to rate pain > 7.

## 2013-08-17 NOTE — Progress Notes (Signed)
[facility]IC[facility] RE[facility]ABILITATION     PROGRESS NOTE    Subjective/Complaints: No specific issues. Left arm remains sensitive. Right thigh remains tender as well.    Objective: Vital Signs: Blood pressure 112/56, pulse 69, temperature 98.3 F (36.8 C), temperature source Oral, resp. rate 18, height 5\' 2"  (1.575 m), weight 74.254 kg (163 lb 11.2 oz), SpO2 97.00%. No results found.  Recent Labs  08/17/13 0548  WBC 6.6  HGB 12.8  HCT 39.5  PLT 294    Recent Labs  08/14/13 1320 08/17/13 0548  NA 140 139  K 4.9 4.2  CL 99 101  GLUCOSE 137* 127*  BUN 13 13  CREATININE 0.75 0.75  CALCIUM 10.0 9.5   CBG (last 3)   Recent Labs  08/16/13 1619 08/16/13 2103 08/17/13 0715  GLUCAP 74 148* 106*    Wt Readings from Last 3 Encounters:  08/14/13 74.254 kg (163 lb 11.2 oz)  08/09/13 78.7 kg (173 lb 8 oz)  08/09/13 78.7 kg (173 lb 8 oz)    Physical Exam:  General: no distress.  Eyes: EOM are normal.  Mouth: oral mucosa pink/moist  Neck: Normal range of motion. Neck supple. No thyromegaly present.  Cardiovascular: Normal rate and regular rhythm. No murmurs  Respiratory: Effort normal and breath sounds normal. No respiratory distress. No wheezes  GI: Soft. Bowel sounds are normal. She exhibits no distension.  Musculoskeletal:  Noted hypersensitivity to left forearm to palpation and simple ROM--allodynia at Left forearm and distal Left arm and elbow Neurological: She is alert and oriented x 3. Reasonable insight and awareness. Follows all simple commands. Stuttering speech is persistent. LUE grossly 2+/5 with pain still an issue. LLE is 3 to 4-/5 prox to distal. Sensation 1/2 LUE. Distal sensory loss to LT in both legs below the knees but inconsistent. Right thigh with subjective sensory loss. Speech dysarthric. Left facial weakness.  Psych:minimal anxiety, pleasant and cooperative.   Assessment/Plan: 1. Functional deficits secondary to left parietal brain  infarcts which require 3+ hours per day of interdisciplinary therapy in a comprehensive inpatient rehab setting. Physiatrist is providing close team supervision and 24 hour management of active medical problems listed below. Physiatrist and rehab team continue to assess barriers to discharge/monitor patient progress toward functional and medical goals. FIM: FIM - Bathing Bathing Steps Patient Completed: Chest;Right Arm;Left Arm Bathing: 4: Min-Patient completes 8-9 45f 10 parts or 75+ percent  FIM - Upper Body Dressing/Undressing Upper body dressing/undressing steps patient completed: Thread/unthread right bra strap;Thread/unthread left bra strap;Hook/unhook bra;Thread/unthread right sleeve of pullover shirt/dresss;Thread/unthread left sleeve of pullover shirt/dress;Put head through opening of pull over shirt/dress;Pull shirt over trunk Upper body dressing/undressing: 4: Min-Patient completed 75 plus % of tasks FIM - Lower Body Dressing/Undressing Lower body dressing/undressing steps patient completed: Thread/unthread right underwear leg;Thread/unthread left underwear leg;Pull underwear up/down;Thread/unthread right pants leg;Thread/unthread left pants leg;Pull pants up/down;Fasten/unfasten pants;Don/Doff right sock;Don/Doff left sock;Don/Doff right shoe;Don/Doff left shoe Lower body dressing/undressing: 4: Min-Patient completed 75 plus % of tasks  FIM - Toileting Toileting steps completed by patient: Adjust clothing prior to toileting;Adjust clothing after toileting;Performs perineal hygiene Toileting Assistive Devices: Grab bar or rail for support Toileting: 4: Steadying assist  FIM - Diplomatic Services operational officer Devices: Grab bars;Elevated toilet seat  FIM - Banker Devices: Teacher, music: 4: Supine > Sit: Min A (steadying Pt. > 75%/lift 1 leg);4: Bed > Chair or W/C: Min A (steadying Pt. > 75%)  FIM - Locomotion:  Wheelchair  Distance: 150 Locomotion: Wheelchair: 4: Travels 150 ft or more: maneuvers on rugs and over door sillls with minimal assistance (Pt.>75%) FIM - Locomotion: Ambulation Locomotion: Ambulation Assistive Devices: Occupational hygienistCane - Quad Ambulation/Gait Assistance: 4: Min assist Locomotion: Ambulation: 2: Travels 50 - 149 ft with minimal assistance (Pt.>75%)  Comprehension Comprehension Mode: Auditory Comprehension: 5-Follows basic conversation/direction: With extra time/assistive device  Expression Expression Mode: Verbal Expression: 3-Expresses basic 50 - 74% of the time/requires cueing 25 - 50% of the time. Needs to repeat parts of sentences.  Social Interaction Social Interaction: 5-Interacts appropriately 90% of the time - Needs monitoring or encouragement for participation or interaction.  Problem Solving Problem Solving: 5-Solves basic problems: With no assist  Memory Memory: 6-More than reasonable amt of time  Medical Problem List and Plan:  1. Functional deficits secondary to left brain infarcts with word finding deficits, weakness. Status post loop recorder to establish etiology of infarct   -some of deficits are old including her stuttering speech 2. DVT Prophylaxis/Anticoagulation:Left upper extremity doppler negative. Subcutaneous Lovenox. Monitor platelet counts and any signs of bleeding  3. Pain Management, left arm pain- Prob CRPS  -pt was seen by neurology in November who felt LUE pain was "musculoskeletal"  -it is entirely unclear when this began---some reports indicate 2008 while others state it's more recently. The patient seems to indicate that it began more recently (POOR HISTORIAN HOWEVER)(also with right thigh pain)  -it appears to me to be consistent with CRPS. Not a great steroid candidate due to diabetes  -will titrate lyrica further up to 100mg  tid, low dose nortriptyline added. Consider SSNRI  -I don't see that she's had imaging of C-spine. No significant neck  pain at present  - Oxycodone as needed. Monitor with increased mobility  4. Mood/depression/anxiety: Patient on Zoloft 50 mg each bedtime prior to admission  5. Neuropsych: This patient is capable of making decisions on her own behalf.  6. Diabetes mellitus with peripheral neuropathy. Hemoglobin A1c 5.8. Check blood sugars a.c. and at bedtime. Sliding scale insulin. Patient on Glucophage 500 mg daily prior to admission. Good control at present 7. Hypertension. Cozaar 50 mg daily. Monitor with increased mobility. Patient had been on Hyzaar 100-25 daily prior to admission.  8. Hyperthyroidism. Continue Tapazole. TSH level 3.400  9. Hyperlipidemia. Zocor  LOS (Days) 3 A FACE TO FACE EVALUATION WAS PERFORMED  Erick ColaceKIRSTEINS,Bryssa Tones E 08/17/2013 7:32 AM

## 2013-08-17 NOTE — Progress Notes (Signed)
Physical Therapy Session Note  Patient Details  Name: Kara HammockSheila L Mendoza MRN: [id number] Date of Birth: [birth year]  Today's Date: 08/17/2013 Time: 0830-0930 Time Calculation (min): 60 min  Short Term Goals: Week 1:  PT Short Term Goal 1 (Week 1): STG=LTG  Skilled Therapeutic Interventions/Progress Updates:    Pt received semi reclined in bed; agreeable to therapy. Oriented x3 to self, place, and situation without cueing; required min cueing to orient self to time using visual aid (initially stated "Friday" for date). Session focused on assessing/addressing gait instability and pt reports of visual disturbances, perception of imbalance. Performed gait 2x150' in controlled and home environments with min A, R HHA. In rehab apartment, performed supine<>sit (HOB flat, no rails) with supervision, increased time, and cueing for LUE management. Negotiated 5 stairs with R rail, forward-facing with step-to pattern and supervision.  Pt reporting dizziness. Vitals taken in seated, standing with (pt-reported lightheadedness upon standing) but no significant change in vitals. Per pt report of diplopia, performed brief oculomotor screen. Smooth pursuit and saccades appear grossly WNL. Pt reports diplopia (described as a "reflection" of object/target; split side-to-side) with near vision which disappeared with one eye closed. Will continue to assess. See below for findings of modified CTSIB. Session ended in pt room, where pt was left seated EOB with bed alarm on and all needs within reach.  Therapy Documentation Precautions:  Precautions Precautions: Fall Precaution Comments: LUE pain (8/10; sharp from hand to deltoid) Restrictions Weight Bearing Restrictions: No Vital Signs: Therapy Vitals Temp: 98.3 F (36.8 C) Temp src: Oral Pulse Rate: 80 Resp: 18 BP: 127/77 mmHg Patient Position (if appropriate): Standing (Immediately following sit>stand; reporting lightheadedness) Oxygen Therapy SpO2: 96 % O2  Device: None (Room air) Pain: Pain Assessment Pain Assessment: 0-10 Pain Score: 7  Pain Type: Acute pain Pain Location: Arm Pain Orientation: Left Pain Radiating Towards:  (entire arm) Pain Descriptors / Indicators: Aching;Burning Pain Frequency: Constant Pain Onset: On-going Patients Stated Pain Goal: 3 Pain Intervention(s): Medication (See eMAR);Repositioned Multiple Pain Sites: No Balance: Static Standing Balance Static Stance: Eyes Closed: > 30 seconds without UE support with supervision Static Stance: on Foam: x15 seconds (prior to pt needing RUE support) without UE support with SBA to min guard Static Stance: on Foam, Eyes Closed: x6 seconds (prior to pt needing RUE support) with min guard-min A secondary to multidirectional postural sway  See FIM for current functional status  Therapy/Group: Individual Therapy  Calvert CantorHobble, Blair A 08/17/2013, 7:33 PM

## 2013-08-17 NOTE — Progress Notes (Signed)
Speech Language Pathology Daily Session Note  Patient Details  Name: Kara Mendoza MRN: [id number] Date of Birth: 1950/06/20  Today's Date: 08/17/2013 Time: 1405-1450 Time Calculation (min): 45 min  Short Term Goals: Week 1: SLP Short Term Goal 1 (Week 1): Patient will maintain fluency at sentence level using continuous phonation with 85% accuracy and minimal cues.  SLP Short Term Goal 2 (Week 1): Patient will utilize word-finding strategies consistently at conversational level (slow down, pause and think; describe word/circumlocute) with 80% accuracy.  Skilled Therapeutic Interventions:  Pt was seen for skilled speech therapy targeting word-finding and fluency strategies.  Pt was noted with easy part word and whole word repetitions during functional conversations; minimal word finding deficits noted in conversation.  Additionally, pt presented with articulatory errors characterized by gliding, fronting, and cluster reduction which produced the effect of infantile speech in functional conversations.  Pt was noted with inconsistent report of whether or not her fluency and/or word finding are worse since this acute stroke stating at one point that they were the same and at another point that they were much worse.  SLP initiated skilled education related to compensatory strategies for both word finding and fluency.  Pt was ~80% accurate for word finding during structured divergent and convergent naming tasks with supervision cues.  SLP also implemented techniques of resonant voice therapy with gentle humming and chanting to facilitate easy onsets and improved speech fluency with >90% fluency.  Continue per current plan of care.     FIM:  Comprehension Comprehension Mode: Auditory Comprehension: 4-Understands basic 75 - 89% of the time/requires cueing 10 - 24% of the time Expression Expression Mode: Verbal Expression: 5-Expresses complex 90% of the time/cues < 10% of the time Social  Interaction Social Interaction: 5-Interacts appropriately 90% of the time - Needs monitoring or encouragement for participation or interaction. Problem Solving Problem Solving: 3-Solves basic 50 - 74% of the time/requires cueing 25 - 49% of the time Memory Memory: 4-Recognizes or recalls 75 - 89% of the time/requires cueing 10 - 24% of the time FIM - Eating Eating Activity: 7: Complete independence:no helper  Pain Pain Assessment Pain Assessment: No/denies pain  Therapy/Group: Individual Therapy  Jackalyn LombardNicole Khaidyn Staebell, M.A. CCC-SLP   Ozan Maclay, Melanee SpryNicole L 08/17/2013, 5:32 PM

## 2013-08-17 NOTE — IPOC Note (Signed)
Overall Plan of Care [facility]) Patient Details Name: Kara Mendoza MRN: [id number] DOB: 10/23/1950  Admitting Diagnosis: LT CVA  Hospital Problems: Active Problems:   CVA (cerebral infarction)     Functional Problem List: Nursing Behavior;Bladder;Bowel;Sensory;Skin Integrity  PT Balance;Endurance;Safety;Pain  OT Balance;Edema;Endurance;Pain;Safety;Sensory;Vision  SLP Motor;Linguistic  TR         Basic ADL's: OT Grooming;Bathing;Dressing;Toileting     Advanced  ADL's: OT Simple Meal Preparation;Laundry;Light Housekeeping     Transfers: PT Bed to Chair  OT Toilet;Tub/Shower     Locomotion: PT Ambulation     Additional Impairments: OT Fuctional Use of Upper Extremity  SLP        TR      Anticipated Outcomes Item Anticipated Outcome  Self Feeding independent  Swallowing      Basic self-care  mod independent  Toileting  mod independent   Bathroom Transfers mod I with toilet, supervision with tub  Bowel/Bladder  Patient wiil have contient of bladder  (Patient will not have injury will on the unit)  Transfers  Modified Independance  Locomotion  Modified Independance  Communication  modified independence for use of speech/language/fluency strategies at sentence and conversational levels  Cognition     Pain  Pain will be relieve pain relief to LUE  Safety/Judgment  Patient will not fall while on the Rehab unit   Therapy Plan: PT Intensity: Minimum of 1-2 x/day ,45 to 90 minutes PT Frequency: 5 out of 7 days PT Duration Estimated Length of Stay: 10-12 days OT Intensity: Minimum of 1-2 x/day, 45 to 90 minutes OT Frequency: 5 out of 7 days OT Duration/Estimated Length of Stay: 7-9 days SLP Frequency: 5 out of 7 days SLP Duration/Estimated Length of Stay: 10-12 days       Team Interventions: Nursing Interventions Patient/Family Education;Bladder Management;Bowel Management;Dysphagia/Aspiration Precaution Training;Cognitive Remediation/Compensation;Pain  Management;Discharge Planning;Medication Management  PT interventions Ambulation/gait training;Balance/vestibular training;Neuromuscular re-education;Functional mobility training;Therapeutic Activities;Stair training;Patient/family education;Therapeutic Exercise;UE/LE Coordination activities;UE/LE Strength taining/ROM  OT Interventions Balance/vestibular training;Discharge planning;DME/adaptive equipment instruction;Functional mobility training;Pain management;Patient/family education;Self Care/advanced ADL retraining;Therapeutic Activities;Therapeutic Exercise;UE/LE Strength taining/ROM  SLP Interventions Cueing hierarchy;Speech/Language facilitation;Therapeutic Activities;Patient/family education;Functional tasks  TR Interventions    SW/CM Interventions      Team Discharge Planning: Destination: PT-Home ,OT- Home , SLP-  Projected Follow-up: PT-Home health PT, OT-  Home health OT, SLP-Outpatient SLP;Home Health SLP (pending ability for transportation, HH vs. outpatient) Projected Equipment Needs: PT-To be determined, OT- To be determined, SLP-None recommended by SLP Equipment Details: PT- , OT-  Patient/family involved in discharge planning: PT- Patient;Patient unable/family or caregiver not available,  OT-Patient, SLP-Patient  MD ELOS: 7d Medical Rehab Prognosis:  Good Assessment: 63 y.o. 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 Patient independent with a cane and walker prior to admission living alone with a home health aide 2 hours daily. Admitted 08/09/2013 with increased aphasia. MRI of the brain showed extension of acute infarcts left parietal operculum, left insula and inferior parietal lobes. MRA of the head without occlusion.  CTA of head and neck negative. Echocardiogram with ejection fraction of 60% no  wall motion abnormalities. Patient did not receive TPA. Neurology followup currently continues on Plavix for CVA prophylaxis as well as subcutaneous Lovenox for DVT prophylaxis. TEE completed that showed no PFO without thrombus and underwent loop recorder implantation 08/12/2013 per Dr. Graciela Husbands. Physical therapy evaluation 08/10/2013    Now requiring 24/7  Rehab RN,MD, as well as CIR level PT, OT and SLP.  Treatment team will focus on ADLs and mobility with goals set at Mod I   See Team Conference Notes for weekly updates to the plan of care
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
## 2013-08-18 ENCOUNTER — Inpatient Hospital Stay (HOSPITAL_COMMUNITY): Payer: Medicare HMO | Admitting: *Deleted

## 2013-08-18 ENCOUNTER — Encounter (HOSPITAL_COMMUNITY): Payer: Medicare HMO | Admitting: Occupational Therapy

## 2013-08-18 ENCOUNTER — Inpatient Hospital Stay (HOSPITAL_COMMUNITY): Payer: Medicare HMO | Admitting: Physical Therapy

## 2013-08-18 ENCOUNTER — Inpatient Hospital Stay (HOSPITAL_COMMUNITY): Payer: Medicare HMO | Admitting: Speech Pathology

## 2013-08-18 DIAGNOSIS — I10 Essential (primary) hypertension: Secondary | ICD-10-CM

## 2013-08-18 DIAGNOSIS — I6330 Cerebral infarction due to thrombosis of unspecified cerebral artery: Secondary | ICD-10-CM

## 2013-08-18 DIAGNOSIS — G90519 Complex regional pain syndrome I of unspecified upper limb: Secondary | ICD-10-CM

## 2013-08-18 LAB — GLUCOSE, CAPILLARY
Glucose-Capillary: 87 mg/dL (ref 70–99)
Glucose-Capillary: 102 mg/dL — ABNORMAL HIGH (ref 70–99)
Glucose-Capillary: 119 mg/dL — ABNORMAL HIGH (ref 70–99)
Glucose-Capillary: 125 mg/dL — ABNORMAL HIGH (ref 70–99)

## 2013-08-18 LAB — ANTIPHOSPHOLIPID SYNDROME EVAL, BLD
Anticardiolipin IgA: 3 APL U/mL — ABNORMAL LOW (ref <22)
Anticardiolipin IgG: 2 GPL U/mL — ABNORMAL LOW (ref <23)
Anticardiolipin IgM: 3 MPL U/mL — ABNORMAL LOW (ref <11)
DRVVT: 32.5 secs (ref <42.9)
Lupus Anticoagulant: NOT DETECTED
PTT Lupus Anticoagulant: 33 secs (ref 28.0–43.0)
Phosphatydalserine, IgA: 5 U/mL (ref <20)
Phosphatydalserine, IgG: 4 U/mL (ref <16)
Phosphatydalserine, IgM: 7 U/mL (ref <22)

## 2013-08-18 MED ORDER — DULOXETINE HCL 20 MG PO CPEP
20.0000 mg | ORAL_CAPSULE | Freq: Every day | ORAL | Status: DC
  Administered 2013-08-18 – 2013-08-22 (×5): 20 mg via ORAL
  Filled 2013-08-18 (×6): qty 1

## 2013-08-18 NOTE — Progress Notes (Addendum)
Physical Therapy Session Note  Patient Details  Name: Marolyn HammockSheila L Cutter MRN: [id number] Date of Birth: 19-Sep-1950  Today's Date: 08/18/2013 Time: [id number] Time Calculation (min): 28 min  Short Term Goals: Week 1:  PT Short Term Goal 1 (Week 1): STG=LTG  Skilled Therapeutic Interventions/Progress Updates:    Pt received seated in w/c; agreeable to therapy. Session focused on fall recovery, car transfer, and gait training. [facility] utilized [facility] x50';however, pt with decreased safety awareness, as exhibited by tendency to utilize cane inappropriately (for example, lifting up cane to point at things while ambulating). Transitioned to gait x200' and x150' without assistive device in controlled and home environments with min guard; no overt LOB noted.   Discussed fall recovery, symptoms of stroke, and situations in which to call EMS. In rehab apartment, explained and demonstrated floor transfer. Pt gave effective return demonstration of floor transfer (seated on couch <>long sitting on floor) with min guard, increased time. Performed car transfer x2 trials with min guard, no assistive device. During initial trial, pt required cueing for safe technique, hand placement with effective within-session carryover during subsequent trial. Session ended in pt room, where pt was left seated in w/c with all needs within reach.  Pt expressing feeling uncomfortable with discharging home alone. Pt believes she'd be able to D/C home with daughter, who lives in a single story home with 3 steps to enter and 2 rails. Will follow up with social worker concerning discharge plan.  Long term goals modified to supervision overall secondary to decreased safety awareness. Long term goal added for floor transfer secondary to pt report of frequent falls prior to admission.  Addendum: Also added long term goals to address memory and ambulation in community environment. Modified stair negotiation goal to reflect primary  entrance of pt's home. Will modify pending change in D/C destination.  Therapy Documentation Precautions:  Precautions Precautions: Fall Precaution Comments: LUE pain (8/10; sharp from hand to deltoid) Restrictions Weight Bearing Restrictions: No General: Amount of Missed PT Time (min): 2 Minutes Missed Time Reason: Patient fatigue Vital Signs: Therapy Vitals Temp: 97.6 F (36.4 C) Temp src: Oral Pulse Rate: 77 Resp: 20 BP: 112/76 mmHg Patient Position (if appropriate): Sitting Oxygen Therapy SpO2: 100 % O2 Device: None (Room air) Pain: Pain Assessment Pain Assessment: 0-10 Pain Score: 7  Pain Type: Chronic pain Pain Location: Arm Pain Orientation: Left Pain Onset: On-going Pain Intervention(s): Other (Comment);Ambulation/increased activity;Distraction (RN already aware, per patient) Multiple Pain Sites: No Locomotion : Ambulation Ambulation/Gait Assistance: 4: Min guard   See FIM for current functional status  Therapy/Group: Individual Therapy  Hobble, Lorenda IshiharaBlair A 08/18/2013, 4:19 PM

## 2013-08-18 NOTE — Progress Notes (Addendum)
[facility] PHYSICAL MEDICINE & REHABILITATION     PROGRESS NOTE    Subjective/Complaints: No specific issues. Left arm remains sensitive. Right thigh remains tender as well.   ROS:  No SOB, has Chest tenderness around loop recorder, aphasic so full review difficult Objective: Vital Signs: Blood pressure 109/61, pulse 75, temperature 98.3 F (36.8 C), temperature source Oral, resp. rate 20, height 5\' 2"  (1.575 m), weight 74.254 kg (163 lb 11.2 oz), SpO2 100.00%. No results found.  Recent Labs  08/17/13 0548  WBC 6.6  HGB 12.8  HCT 39.5  PLT 294    Recent Labs  08/17/13 0548  NA 139  K 4.2  CL 101  GLUCOSE 127*  BUN 13  CREATININE 0.75  CALCIUM 9.5   CBG (last 3)   Recent Labs  08/17/13 1200 08/17/13 1603 08/17/13 2047  GLUCAP 102* 111* 116*    Wt Readings from Last 3 Encounters:  08/14/13 74.254 kg (163 lb 11.2 oz)  08/09/13 78.7 kg (173 lb 8 oz)  08/09/13 78.7 kg (173 lb 8 oz)    Physical Exam:  General: no distress.  Eyes: EOM are normal.  Mouth: oral mucosa pink/moist  Neck: Normal range of motion. Neck supple. No thyromegaly present.  Cardiovascular: Normal rate and regular rhythm. No murmurs  Respiratory: Effort normal and breath sounds normal. No respiratory distress. No wheezes  GI: Soft. Bowel sounds are normal. She exhibits no distension.  Musculoskeletal:  Noted hypersensitivity to left forearm to palpation and simple ROM--allodynia at Left forearm and distal Left arm and elbow Neurological: She is alert and oriented x 3. Reasonable insight and awareness. Follows all simple commands. Stuttering speech is persistent. LUE grossly 2+/5 with pain still an issue. LLE is 3 to 4-/5 prox to distal. Sensation 1/2 LUE. Distal sensory loss to LT in both legs below the knees but inconsistent. Right thigh with subjective sensory loss. Speech dysarthric. Left facial weakness.  Psych:minimal anxiety, pleasant and cooperative.   Assessment/Plan: 1.  Functional deficits secondary to left parietal brain infarcts which require 3+ hours per day of interdisciplinary therapy in a comprehensive inpatient rehab setting. Physiatrist is providing close team supervision and 24 hour management of active medical problems listed below. Physiatrist and rehab team continue to assess barriers to discharge/monitor patient progress toward functional and medical goals. FIM: FIM - Bathing Bathing Steps Patient Completed: Chest;Right Arm;Left Arm;Abdomen;Front perineal area;Buttocks;Right upper leg;Left upper leg;Right lower leg (including foot);Left lower leg (including foot) Bathing: 5: Set-up assist to: Adjust water temp  FIM - Upper Body Dressing/Undressing Upper body dressing/undressing steps patient completed: Thread/unthread right bra strap;Thread/unthread left bra strap;Hook/unhook bra;Thread/unthread right sleeve of pullover shirt/dresss;Thread/unthread left sleeve of pullover shirt/dress;Put head through opening of pull over shirt/dress;Pull shirt over trunk Upper body dressing/undressing: 5: Set-up assist to: Obtain clothing/put away FIM - Lower Body Dressing/Undressing Lower body dressing/undressing steps patient completed: Thread/unthread right underwear leg;Thread/unthread left underwear leg;Pull underwear up/down;Thread/unthread right pants leg;Thread/unthread left pants leg;Pull pants up/down;Fasten/unfasten pants;Don/Doff right sock;Don/Doff left sock Lower body dressing/undressing: 5: Set-up assist to: Obtain clothing  FIM - Toileting Toileting steps completed by patient: Adjust clothing prior to toileting;Performs perineal hygiene;Adjust clothing after toileting Toileting Assistive Devices: Grab bar or rail for support Toileting: 4: Steadying assist  FIM - Diplomatic Services operational officer Devices: Grab bars;Cane (quad cane) Toilet Transfers: 5-To toilet/BSC: Supervision (verbal cues/safety issues);4-From toilet/BSC: Min A  (steadying Pt. > 75%)  FIM - Banker Devices: Cane (quad cane) Bed/Chair Transfer: 5: Sit >  Supine: Supervision (verbal cues/safety issues);5: Bed > Chair or W/C: Supervision (verbal cues/safety issues);5: Chair or W/C > Bed: Supervision (verbal cues/safety issues)  FIM - Locomotion: Wheelchair Distance: 150 Locomotion: Wheelchair: 0: Activity did not occur (Pt ambulatory on unit) FIM - Locomotion: Ambulation Locomotion: Ambulation Assistive Devices: Other (comment) (R HHA) Ambulation/Gait Assistance: 4: Min assist Locomotion: Ambulation: 4: Travels 150 ft or more with minimal assistance (Pt.>75%)  Comprehension Comprehension Mode: Auditory Comprehension: 4-Understands basic 75 - 89% of the time/requires cueing 10 - 24% of the time  Expression Expression Mode: Verbal Expression: 5-Expresses complex 90% of the time/cues < 10% of the time  Social Interaction Social Interaction: 5-Interacts appropriately 90% of the time - Needs monitoring or encouragement for participation or interaction.  Problem Solving Problem Solving: 3-Solves basic 50 - 74% of the time/requires cueing 25 - 49% of the time  Memory Memory: 4-Recognizes or recalls 75 - 89% of the time/requires cueing 10 - 24% of the time  Medical Problem List and Plan:  1. Functional deficits secondary to left brain infarcts with word finding deficits, weakness. Status post loop recorder to establish etiology of infarct   -some of deficits are old including her stuttering speech 2. DVT Prophylaxis/Anticoagulation:Left upper extremity doppler negative. Subcutaneous Lovenox. Monitor platelet counts and any signs of bleeding  3. Pain Management, left arm pain- Prob CRPS  -pt was seen by neurology in November who felt LUE pain was "musculoskeletal"  -it is entirely unclear when this began---some reports indicate 2008 while others state it's more recently. The patient seems to indicate that it  began more recently (POOR HISTORIAN HOWEVER)(also with right thigh pain)  -it appears to me to be consistent with CRPS. Not a great steroid candidate due to diabetes or stellate Ganglion block due to chronicity of problem  -will titrate lyrica further up to 100mg  tid, low dose nortriptyline added.   -R thigh pain no swelling- check LE duplex, may be meralgia paresthetica  - Oxycodone as needed. Monitor with increased mobility  4. Mood/depression/anxiety: Patient on Zoloft 50 mg each bedtime prior to admission  5. Neuropsych: This patient is capable of making decisions on her own behalf.  6. Diabetes mellitus with peripheral neuropathy. Hemoglobin A1c 5.8. Check blood sugars a.c. and at bedtime. Sliding scale insulin. Patient on Glucophage 500 mg daily prior to admission. Good control at present 7. Hypertension. Cozaar 50 mg daily. Monitor with increased mobility. Patient had been on Hyzaar 100-25 daily prior to admission.  8. Hyperthyroidism. Continue Tapazole. TSH level 3.400  9. Hyperlipidemia. Zocor  LOS (Days) 4 A FACE TO FACE EVALUATION WAS PERFORMED  KIRSTEINS,ANDREW E 08/18/2013 6:59 AM

## 2013-08-18 NOTE — Progress Notes (Signed)
Occupational Therapy Session Note  Patient Details  Name: Kara Mendoza MRN: [id number] Date of Birth: Jul 03, 1950  Today's Date: 08/18/2013 Time: [id number] Time Calculation (min): 57 min  Short Term Goals: Week 1:  OT Short Term Goal 1 (Week 1): STG=LTG (STG= LTG)  Skilled Therapeutic Interventions/Progress Updates:    Pt seen for ADL retraining with focus on safety with all mobility, activity tolerance, endurance, balance, and functional use of LUE during self-care tasks of bathing and dressing. Pt in bed finishing breakfast upon arrival, educated on safety with tub/shower transfer and discussed recommendation of use of tub bench for safety with transfers as well as during bathing.  Pt ambulated to bathroom for shower with quad cane with min assist initially upon exiting bed due to decreased balance and balance reactions.  Pt again with reports of light-headedness during session.  Pt attempted to wash feet in standing with LOB, re-educated pt on fall risk and with h/o falls suggesting use of shower chair/tub bench when washing LB.  Pt gathered clothing utilizing forward, backward, and side stepping with supervision.  Attempted to don pants in standing, educated on sitting to don pants due to decreased balance reactions and fall risk.  Pt demonstrated increased use of LUE during self-care tasks even incorporating it with threading and pulling up pants this session.  Therapy Documentation Precautions:  Precautions Precautions: Fall Precaution Comments: LUE pain (8/10; sharp from hand to deltoid) Restrictions Weight Bearing Restrictions: No Pain: Pain Assessment Pain Assessment: 0-10 Pain Score: 4  Pain Type: Acute pain Pain Location: Arm Pain Orientation: Left Pain Descriptors / Indicators: Aching;Burning Pain Onset: On-going Patients Stated Pain Goal: 3 Pain Intervention(s): Medication (See eMAR)  See FIM for current functional status  Therapy/Group: Individual Therapy  Rosalio LoudHOXIE,  Placida Cambre 08/18/2013, 10:50 AM

## 2013-08-18 NOTE — Progress Notes (Signed)
Speech Language Pathology Daily Session Note  Patient Details  Name: Marolyn HammockSheila L Web MRN: [id number] Date of Birth: 11/05/1950  Today's Date: 08/18/2013 Time: [id number] Time Calculation (min): 40 min  Short Term Goals: Week 1: SLP Short Term Goal 1 (Week 1): Patient will maintain fluency at sentence level using continuous phonation with 85% accuracy and minimal cues.  SLP Short Term Goal 2 (Week 1): Patient will utilize word-finding strategies consistently at conversational level (slow down, pause and think; describe word/circumlocute) with 80% accuracy. SLP Short Term Goal 3 (Week 1): Pt will improve semi-complex problem solving during functional tasks for 80% accuracy with supervision cues.  SLP Short Term Goal 4 (Week 1): Pt will improve safety awareness during functional tasks for 80% accuracy with supervision cues.    Skilled Therapeutic Interventions:  Pt was seen for skilled speech therapy targeting problem solving and safety awareness during functional kitchen tasks.  SLP engaged pt in a basic cooking task targeting sequencing, organizing, and error awareness with min assist verbal cues for safety precautions including finishing one task before moving to the next task and keeping right hand free for use of cane for stability and balance.  Additionally, pt benefited from supervision question cues to use cell phone as an external aid for time management.  No overt safety concerns were noted during ambulation to bathroom at the end of the session; however, pt benefited from initial supervision level cues to apply brakes to wheel chair before standing up at the beginning and end of the treatment session.  Short term goals added for problem solving and safety awareness and long term goals downgraded to supervision due to safety concerns.    FIM:  Comprehension Comprehension Mode: Auditory Comprehension: 5-Understands basic 90% of the time/requires cueing < 10% of the  time Expression Expression Mode: Verbal Expression: 5-Expresses basic 90% of the time/requires cueing < 10% of the time. Social Interaction Social Interaction: 5-Interacts appropriately 90% of the time - Needs monitoring or encouragement for participation or interaction. Problem Solving Problem Solving: 4-Solves basic 75 - 89% of the time/requires cueing 10 - 24% of the time Memory Memory: 5-Recognizes or recalls 90% of the time/requires cueing < 10% of the time  Pain Pain Assessment Pain Assessment: No/denies pain  Therapy/Group: Individual Therapy  Jackalyn LombardNicole Oscar Forman, M.A. CCC-SLP  Lauryn Lizardi, Melanee SpryNicole L 08/18/2013, 5:33 PM

## 2013-08-18 NOTE — Progress Notes (Signed)
Physical Therapy Session Note  Patient Details  Name: Kara HammockSheila L Wadleigh MRN: [id number] Date of Birth: 1950/08/22  Today's Date: 08/18/2013 Time: 10:05-11:05 (60min)   Short Term Goals: Week 1:  PT Short Term Goal 1 (Week 1): STG=LTG  Skilled Therapeutic Interventions/Progress Updates:  Tx focused on CRPS relief techniques, gait and balance.  Pt up in bed, sister present with many questions about pt's condition, nurse and PA came to address questions and concerns.   Performed transfers and gait with SPC with min A overall and cues for increase LUE use in mobility and arm swing in gait.  Gait training 2x100' and 1x400' with SPC in controlled and carpeted setting; and cues for increasing step width for safety. Min LUE assist for arm swing.  Educated pt extensively on nervous system, chronic pain, mobility, and instructed pt in:  - gentle, fluid AROM techniques for LUE in sitting and standing x765min  - mirror therapy x655min in sitting - gentle application of kinesotape for increased LUE circulation - Nustep x165min with bil LEs and pt choosing to use bil UEs for gentle AAROM.   Pt with much improved LUE motion and less guarding as session progress, reporting pain decreased from 7/10>5/10.       Therapy Documentation Precautions:  Precautions Precautions: Fall Precaution Comments: LUE pain (8/10; sharp from hand to deltoid) Restrictions Weight Bearing Restrictions: No    Pain: Pain Assessment Pain Assessment: 0-10 Pain Score: 4  Pain Type: Acute pain Pain Location: Arm Pain Orientation: Left Pain Descriptors / Indicators: Aching;Burning Pain Onset: On-going Patients Stated Pain Goal: 3 Pain Intervention(s): Medication (See eMAR)  See FIM for current functional status  Therapy/Group: Individual Therapy Clydene Lamingole Lurena Naeve, PT, DPT  08/18/2013, 10:33 AM

## 2013-08-19 ENCOUNTER — Inpatient Hospital Stay (HOSPITAL_COMMUNITY): Payer: Medicare HMO | Admitting: Physical Therapy

## 2013-08-19 ENCOUNTER — Inpatient Hospital Stay (HOSPITAL_COMMUNITY): Payer: Medicare HMO | Admitting: Speech Pathology

## 2013-08-19 ENCOUNTER — Inpatient Hospital Stay (HOSPITAL_COMMUNITY): Payer: Medicare HMO

## 2013-08-19 ENCOUNTER — Encounter (HOSPITAL_COMMUNITY): Payer: Medicare HMO | Admitting: Occupational Therapy

## 2013-08-19 DIAGNOSIS — M79609 Pain in unspecified limb: Secondary | ICD-10-CM

## 2013-08-19 LAB — URINALYSIS, ROUTINE W REFLEX MICROSCOPIC
Bilirubin Urine: NEGATIVE
Glucose, UA: NEGATIVE mg/dL
Hgb urine dipstick: NEGATIVE
Ketones, ur: NEGATIVE mg/dL
Nitrite: NEGATIVE
Protein, ur: NEGATIVE mg/dL
Specific Gravity, Urine: 1.014 (ref 1.005–1.030)
Urobilinogen, UA: 0.2 mg/dL (ref 0.0–1.0)
pH: 5 (ref 5.0–8.0)

## 2013-08-19 LAB — URINE MICROSCOPIC-ADD ON

## 2013-08-19 LAB — GLUCOSE, CAPILLARY
Glucose-Capillary: 92 mg/dL (ref 70–99)
Glucose-Capillary: 95 mg/dL (ref 70–99)
Glucose-Capillary: 105 mg/dL — ABNORMAL HIGH (ref 70–99)
Glucose-Capillary: 117 mg/dL — ABNORMAL HIGH (ref 70–99)

## 2013-08-19 MED ORDER — METRONIDAZOLE 500 MG PO TABS
500.0000 mg | ORAL_TABLET | Freq: Two times a day (BID) | ORAL | Status: DC
  Administered 2013-08-19 – 2013-08-22 (×7): 500 mg via ORAL
  Filled 2013-08-19 (×9): qty 1

## 2013-08-19 NOTE — Progress Notes (Signed)
Patient would like to discuss her options with having a different agency to provide aid care, patient stated that with her current agency the sitter does not always show up and that the sitter is unaware of what her diet and care needs are.Provided patient with educational material on diabetic diet.  Diamantina MonksARPENTER,Isbella Arline, RN

## 2013-08-19 NOTE — Progress Notes (Addendum)
Physical Therapy Session Note  Patient Details  Name: Kara Mendoza MRN: [id number] Date of Birth: Mar 09, 1950  Today's Date: 08/19/2013 Time: [id number] Time Calculation (min): 42 min  Short Term Goals: Week 1:  PT Short Term Goal 1 (Week 1): STG=LTG  Skilled Therapeutic Interventions/Progress Updates:    Pt received seated in w/c; agreeable to therapy. Session focused on increasing pt stability/independence with functional mobility in community environment. Modified w/c to decrease height and depth to increase pt independence with w/c mobility. Added brake lock extenders to increase pt independence with w/c parts management. Cueing consistently required throughout session to for pt to lock w/c brakes prior to performing sit>stand transfer. Pt performed w/c mobility x180' in controlled and community environments with supervision using bilat UE/LE's. Transported pt to hospital lobby, where pt performed w/c gait x200' in community environment (inside, outdoors on inclined/declined sidewalk) with [facility] and close supervision; no overt LOB. Negotiation standard curb x2 trials using SPC with min guard for initial trial, supervision and cueing (for technique) during subsequent trial. In outdoor community environment, pt negotiated 5 stairs with L rail to ascend, R rail to descend with step-to pattern and supervision; no cueing required for technique/sequencing.  During descent of stairs, pt did exhibit slight LOB secondary to R toe catch with effective self-recovery. Returned to rehab unit, where therapist discussed D/C recommendations with focus on pt requiring supervision at all times when standing/ambulating. Explained recommendation that pt utilize w/c only for mobility for short periods of time when son/daughter are not home with pt. Pt verbalized understanding and was in full agreement with recommendations. Therapist departed pt room with pt seated in w/c with all needs within reach.  RN reporting that pt  has consistently attempted to ambulate within room without calling staff for assist. Therefore, quick release belt added to safety plan. RN notified.   Addendum: Modified long term goal for basic transfers to Mod I; added LTG's for w/c mobility in controlled/home environments to progress toward pt being mod I at w/c level at D/C.   Therapy Documentation Precautions:  Precautions Precautions: Fall Precaution Comments: LUE pain Restrictions Weight Bearing Restrictions: No Vital Signs: Therapy Vitals Temp: 98.7 F (37.1 C) Temp src: Oral Pulse Rate: 68 Resp: 18 BP: 106/63 mmHg Patient Position (if appropriate): Lying Oxygen Therapy SpO2: 97 % O2 Device: None (Room air) Pain: Pain Assessment Pain Assessment: 0-10 Pain Score: 8  Pain Type: Chronic pain Pain Location: Arm Pain Orientation: Left Pain Descriptors / Indicators: Aching;Burning Pain Intervention(s): Medication (See eMAR) (applied ice and elevated) Locomotion : Ambulation Ambulation/Gait Assistance: 4: Min guard High Level Ambulation High Level Ambulation: Side stepping;Backwards walking;Head turns Side Stepping: close superviison Backwards Walking: min guard Head Turns: min guard Stairs / Additional Locomotion Stairs: Yes Stairs Assistance: 5: Supervision Stair Management Technique: Two rails Number of Stairs: 5 Height of Stairs: 7   See FIM for current functional status  Therapy/Group: Individual Therapy  Kailin Leu, Lorenda IshiharaBlair A 08/19/2013, 4:16 PM

## 2013-08-19 NOTE — Progress Notes (Signed)
Social Work Patient ID: Kara Mendoza, female   DOB: 06-20-1950, 63 y.o.   MRN: [id number] Met with pt and spoke with daughter-Brandy via telephone to discuss team conference goals-supervision level and discharge Sat 7/18. Pt would benefit from someone to be there with her for safety and balance issues.  Discussed would be safer for pt to go to daughter's home where Someone is there in the evenings and nights rather than home with two hours of PCS services.  Daughter to discuss with her brother and pt and get back with This worker regarding their plan.  Asked to do family education on Sat am with daughter and son so can see the assist she requires at home.  Will make referral to  [facility] since she prefers them since she had them previously.  Pt agreeable to the plan and glad she is doing well.

## 2013-08-19 NOTE — Progress Notes (Signed)
[place] PHYSICAL MEDICINE & REHABILITATION     PROGRESS NOTE    Subjective/Complaints: No specific issues. Left arm remains sensitive. Right thigh remains tender as well.  Reviewed note from [facility] Neurology office  ROS:  No SOB, has Chest tenderness around loop recorder, aphasic so full review difficult Objective: Vital Signs: Blood pressure 115/76, pulse 68, temperature 97.7 F (36.5 C), temperature source Oral, resp. rate 18, height 5' 2"  (1.575 m), weight 74.254 kg (163 lb 11.2 oz), SpO2 96.00%. No results found.  Recent Labs  08/17/13 0548  WBC 6.6  HGB 12.8  HCT 39.5  PLT 294    Recent Labs  08/17/13 0548  NA 139  K 4.2  CL 101  GLUCOSE 127*  BUN 13  CREATININE 0.75  CALCIUM 9.5   CBG (last 3)   Recent Labs  08/18/13 1134 08/18/13 1608 08/18/13 2107  GLUCAP 102* 119* 125*    Wt Readings from Last 3 Encounters:  08/14/13 74.254 kg (163 lb 11.2 oz)  08/09/13 78.7 kg (173 lb 8 oz)  08/09/13 78.7 kg (173 lb 8 oz)    Physical Exam:  General: no distress.  Eyes: EOM are normal.  Mouth: oral mucosa pink/moist  Neck: Normal range of motion. Neck supple. No thyromegaly present.  Cardiovascular: Normal rate and regular rhythm. No murmurs  Respiratory: Effort normal and breath sounds normal. No respiratory distress. No wheezes  GI: Soft. Bowel sounds are normal. She exhibits no distension.  Musculoskeletal:  Noted hypersensitivity to left forearm to palpation and simple ROM--allodynia at Left forearm and distal Left arm and elbow Neurological: She is alert and oriented x 3. . Follows all simple commands. Stuttering speech is persistent. LUE grossly 2+/5 with pain still an issue. LLE is 3 to 4-/5 prox to distal. Sensation 1/2 LUE. Distal sensory loss to LT in both legs below the knees but inconsistent. Right thigh with subjective sensory loss, lateral and anterior aspect. Speech dysarthric. Left facial weakness.  Psych:minimal anxiety, pleasant and  cooperative.   Assessment/Plan: 1. Functional deficits secondary to left parietal brain infarcts which require 3+ hours per day of interdisciplinary therapy in a comprehensive inpatient rehab setting. Physiatrist is providing close team supervision and 24 hour management of active medical problems listed below. Physiatrist and rehab team continue to assess barriers to discharge/monitor patient progress toward functional and medical goals. Team conference today please see physician documentation under team conference tab, met with team face-to-face to discuss problems,progress, and goals. Formulized individual treatment plan based on medical history, underlying problem and comorbidities. FIM: FIM - Bathing Bathing Steps Patient Completed: Chest;Right Arm;Left Arm;Abdomen;Front perineal area;Buttocks;Right upper leg;Left upper leg;Right lower leg (including foot);Left lower leg (including foot) Bathing: 5: Supervision: Safety issues/verbal cues  FIM - Upper Body Dressing/Undressing Upper body dressing/undressing steps patient completed: Thread/unthread right bra strap;Thread/unthread left bra strap;Hook/unhook bra;Thread/unthread right sleeve of pullover shirt/dresss;Thread/unthread left sleeve of pullover shirt/dress;Put head through opening of pull over shirt/dress;Pull shirt over trunk Upper body dressing/undressing: 5: Supervision: Safety issues/verbal cues FIM - Lower Body Dressing/Undressing Lower body dressing/undressing steps patient completed: Thread/unthread right underwear leg;Thread/unthread left underwear leg;Pull underwear up/down;Thread/unthread right pants leg;Thread/unthread left pants leg;Pull pants up/down;Fasten/unfasten pants;Don/Doff right sock;Don/Doff left sock Lower body dressing/undressing: 5: Supervision: Safety issues/verbal cues  FIM - Toileting Toileting steps completed by patient: Adjust clothing prior to toileting;Performs perineal hygiene;Adjust clothing after  toileting Toileting Assistive Devices: Grab bar or rail for support Toileting: 4: Steadying assist  FIM - Radio producer Devices: Grab  bars;Cane Toilet Transfers: 4-To toilet/BSC: Min A (steadying Pt. > 75%)  FIM - Bed/Chair Transfer Bed/Chair Transfer Assistive Devices: Arm rests Bed/Chair Transfer: 4: Bed > Chair or W/C: Min A (steadying Pt. > 75%);4: Chair or W/C > Bed: Min A (steadying Pt. > 75%) (min guard)  FIM - Locomotion: Wheelchair Distance: 150 Locomotion: Wheelchair: 0: Activity did not occur (Pt ambulatory on unit) FIM - Locomotion: Ambulation Locomotion: Ambulation Assistive Devices: Other (comment) (none) Ambulation/Gait Assistance: 4: Min guard Locomotion: Ambulation: 4: Travels 150 ft or more with minimal assistance (Pt.>75%)  Comprehension Comprehension Mode: Auditory Comprehension: 5-Understands basic 90% of the time/requires cueing < 10% of the time  Expression Expression Mode: Verbal Expression: 5-Expresses basic 90% of the time/requires cueing < 10% of the time.  Social Interaction Social Interaction: 5-Interacts appropriately 90% of the time - Needs monitoring or encouragement for participation or interaction.  Problem Solving Problem Solving: 4-Solves basic 75 - 89% of the time/requires cueing 10 - 24% of the time  Memory Memory: 5-Recognizes or recalls 90% of the time/requires cueing < 10% of the time  Medical Problem List and Plan:  1. Functional deficits secondary to left brain infarcts with word finding deficits, weakness. Status post loop recorder to establish etiology of infarct   -some of deficits are old including her stuttering speech 2. DVT Prophylaxis/Anticoagulation:Left upper extremity doppler negative. Subcutaneous Lovenox. Monitor platelet counts and any signs of bleeding  3. Pain Management, left arm pain- Prob CRPS  -pt was seen by neurology in November who felt LUE pain was "musculoskeletal"  -it is  entirely unclear when this began---some reports indicate 2008 while others state it's more recently. The patient seems to indicate that it began more recently ([place] HOWEVER)(also with right thigh pain)  -it appears to me to be consistent with CRPS. Not a great steroid candidate due to diabetes or stellate Ganglion block due to chronicity of problem or need for plavix  -will titrate lyrica further up to 167m tid, low dose nortriptyline added.   -R thigh pain no swelling- check LE duplex, may be meralgia paresthetica vs CVA related parasthesia  - Oxycodone as needed. Monitor with increased mobility  4. Mood/depression/anxiety: Patient on Zoloft 50 mg each bedtime prior to admission  5. Neuropsych: This patient is capable of making decisions on her own behalf.  6. Diabetes mellitus with peripheral neuropathy. Hemoglobin A1c 5.8. Check blood sugars a.c. and at bedtime. Sliding scale insulin. Patient on Glucophage 500 mg daily prior to admission. Good control at present 7. Hypertension. Cozaar 50 mg daily. Monitor with increased mobility. Patient had been on Hyzaar 100-25 daily prior to admission.  8. Hyperthyroidism. Continue Tapazole. TSH level 3.400  9. Hyperlipidemia. Zocor  LOS (Days) 5 A FACE TO FACE EVALUATION WAS PERFORMED  Akio Hudnall E 08/19/2013 7:07 AM

## 2013-08-19 NOTE — Progress Notes (Signed)
Occupational Therapy Session Note  Patient Details  Name: Kara Mendoza MRN: [id number] Date of Birth: 1950/03/11  Today's Date: 08/19/2013 Time: [id number] Time Calculation (min): 57 min  Short Term Goals: Week 1:  OT Short Term Goal 1 (Week 1): STG=LTG (STG= LTG)  Skilled Therapeutic Interventions/Progress Updates:    Engaged in ADL retraining with focus on safety, balance, and use of AE/DME during self-care tasks of bathing and dressing. Pt finishing breakfast upon arrival, discussed change in d/c plan to going to stay with daughter who works during the day but son is able to provide intermittent supervision while sister at work.  Pt completed bathing and dressing in ADL apt bathroom with min verbal cues for safe use of tub transfer bench for tub/shower transfer and safety with bathing at shower level.  Pt demonstrated increased safety in ADL bathroom with tub/shower when compared to walk in shower, requiring only intermittent supervision during bathing and dressing.  Discussed establishing a routine for bathing and to always perform task in same manner to increase safety.  Encouraged pt to gather all items prior to bathing.  Pt utilized single point cane in bathroom and when ambulating back to room with supervision, no unsafe behaviors with ambulation this session.  Discussed need for tub bench and grab bar for bathroom to increase safety with transfers and bathing to which pt reports understanding.  Continue to recommend supervision with all self-care and home management tasks as pt with unsafe tendencies and decreased memory.  Therapy Documentation Precautions:  Precautions Precautions: Fall Precaution Comments: LUE pain (8/10; sharp from hand to deltoid) Restrictions Weight Bearing Restrictions: No Pain:  Pt with c/o pain in LUE, premedicated  See FIM for current functional status  Therapy/Group: Individual Therapy  Rosalio LoudHOXIE, [name] 08/19/2013, 10:09 AM

## 2013-08-19 NOTE — Progress Notes (Signed)
Recreational Therapy Session Note  Patient Details  Name: Marolyn HammockSheila L Massimino MRN: [id number] Date of Birth: Oct 23, 1950 Today's Date: 08/19/2013  Pt participated in animal assisted activity/therapy seated EOB with supervision. Besnik Febus 08/19/2013, 5:32 PM

## 2013-08-19 NOTE — Progress Notes (Signed)
Physical Therapy Session Note  Patient Details  Name: Kara HammockSheila L Regino MRN: [id number] Date of Birth: February 15, 1950  Today's Date: 08/19/2013 Time: [id number] Time Calculation (min): 60 min  Short Term Goals: Week 1:  PT Short Term Goal 1 (Week 1): STG=LTG  Skilled Therapeutic Interventions/Progress Updates:  tx focused high level balance retraining, advanced gait, activity tolerance  Dynamic standing balance activity of tossing bean bags to target, feet close together to challenge balance, without LOB.  MIn assist needed to add up score. Also retrieving bean bags from floor using L hand by squatting, and agility ladder sideways and backwards and forwards, without dizziness or LOB.   neuromuscular re-education via forced use, VCs for:  - step/taps and step-ups with R and L foot onto 5" high step with RUE support on therapist, fading to no UE support.  L knee buckled once slightly, recovered independently by pt.  - use of L hand spontaneously to pet therapy dog.  -Otago A exs in standing: bil calf raises, toe raises, hip abduction, mini squats x 10 x 1 each -w/c propulsion using hemi method RUE and RLE  While transporting agility ladder by dragging it with bil hands while ambulating, pt demonstrated problem-solving with extra time for clearing door frame. Gait pushing grocery cart with bil hands, x 150' with supervision.  Pt asked to rest in bed at end of tx; bed mobility modified independent.  Bed alarm set and all needs set within reach.    Therapy Documentation Precautions:  Precautions Precautions: Fall Precaution Comments: LUE pain Restrictions Weight Bearing Restrictions: No Pain: Pain Assessment Pain Assessment: 0-10 Pain Score: 6  Pain Type: Chronic pain Pain Location: Arm Pain Orientation: Left Pain Descriptors / Indicators: Aching;Burning Pain Intervention(s): Medication (See eMAR) (applied ice and elevated)   Locomotion : Ambulation Ambulation/Gait Assistance: 4: Min  guard High Level Ambulation High Level Ambulation: Side stepping;Backwards walking;Head turns Side Stepping: close superviison Backwards Walking: min guard Head Turns: min guard Stairs / Additional Locomotion Stairs: Yes Stairs Assistance: 5: Supervision Stair Management Technique: Two rails Number of Stairs: 5 Height of Stairs: 7     See FIM for current functional status  Therapy/Group: Individual Therapy  Flannery Cavallero 08/19/2013, 3:53 PM

## 2013-08-19 NOTE — Progress Notes (Signed)
Speech Language Pathology Daily Session Note  Patient Details  Name: Marolyn HammockSheila L Spaugh MRN: [id number] Date of Birth: 1950-11-29  Today's Date: 08/19/2013 Time: [id number] Time Calculation (min): 45 min  Short Term Goals: Week 1: SLP Short Term Goal 1 (Week 1): Patient will maintain fluency at sentence level using continuous phonation with 85% accuracy and minimal cues.  SLP Short Term Goal 2 (Week 1): Patient will utilize word-finding strategies consistently at conversational level (slow down, pause and think; describe word/circumlocute) with 80% accuracy. SLP Short Term Goal 3 (Week 1): Pt will improve semi-complex problem solving during functional tasks for 80% accuracy with supervision cues.  SLP Short Term Goal 4 (Week 1): Pt will improve safety awareness during functional tasks for 80% accuracy with supervision cues.    Skilled Therapeutic Interventions:  Pt was seen for skilled speech therapy targeting safety awareness, problem solving, and word finding during functional tasks.  SLP facilitated session with a moderately complex generative naming task targeting mental flexibility, thought organization, and word finding.  Pt benefited from supervision cues for problem solving during the aforementioned task and intermittent min assist semantic and phonemic cues for functional word finding.  Upon return to room, multiple unassisted transfers out of chair were noted on pt's safety plan.  Reviewed and reinforced safety precautions and recommendations to ask for assist to go to bathroom.  Pt reported urinary urgency and requested to use restroom prior to completion of session.  SLP facilitated session with supervision cues for safety while ambulating to bathroom.  Continue per current plan of care.     FIM:  Comprehension Comprehension Mode: Auditory Comprehension: 5-Understands basic 90% of the time/requires cueing < 10% of the time Expression Expression Mode: Verbal Expression: 5-Expresses basic  90% of the time/requires cueing < 10% of the time. Social Interaction Social Interaction: 5-Interacts appropriately 90% of the time - Needs monitoring or encouragement for participation or interaction. Problem Solving Problem Solving: 4-Solves basic 75 - 89% of the time/requires cueing 10 - 24% of the time Memory Memory: 4-Recognizes or recalls 75 - 89% of the time/requires cueing 10 - 24% of the time  Pain Pain Assessment Pain Assessment: No/denies pain   Therapy/Group: Individual Therapy  Jackalyn LombardNicole Terre Zabriskie, M.A. CCC-SLP  Mima Cranmore, Melanee SpryNicole L 08/19/2013, 4:47 PM

## 2013-08-19 NOTE — Progress Notes (Signed)
*  PRELIMINARY RESULTS* Vascular Ultrasound Right lower extremity venous duplex has been completed.  Preliminary findings: Right:  No evidence of DVT, superficial thrombosis, or Baker's cyst.   Jaquisha Frech FRANCES 08/19/2013, 4:59 PM

## 2013-08-19 NOTE — Patient Care Conference (Signed)
Inpatient RehabilitationTeam Conference and Plan of Care Update Date: 08/19/2013   Time: 10;30 AM    Patient Name: Kara Mendoza      Medical Record Number: [id number]  Date of Birth: 1950-05-15 Sex: Female         Room/Bed: 4M03C/4M03C-01 Payor Info: Payor: [employer] MEDICARE / Plan: [employer] MEDICARE HMO / Product Type: *No Product type* /    Admitting Diagnosis: LT CVA  Admit Date/Time:  08/14/2013  3:59 PM Admission Comments: No comment available   Primary Diagnosis:  <principal problem not specified> Principal Problem: <principal problem not specified>  Patient Active Problem List   Diagnosis Date Noted  . CVA (cerebral infarction) 08/15/2013  . Cerebral embolism with cerebral infarction 08/09/2013  . Dysarthria 08/09/2013  . Abnormality of gait 05/01/2012  . Incontinence of urine 05/01/2012  . Pneumonia 03/10/2012  . Tobacco abuse 01/28/2012  . Fatigue 01/01/2011  . Weakness generalized 11/03/2010  . Headache 09/01/2010  . Plantar fasciitis 09/01/2010  . Allergic rhinitis 07/11/2010  . Persistent cough 06/16/2010  . Asthma night-time symptoms 06/14/2010  . GERD (gastroesophageal reflux disease) 05/26/2010  . Stroke 05/26/2010  . Hyperlipidemia 05/26/2010  . Hypertension 05/26/2010  . Neuropathy 05/26/2010  . Osteoarthritis 05/26/2010    Expected Discharge Date: Expected Discharge Date: 08/22/13  Team Members Present: Physician leading conference: Dr. Claudette LawsAndrew Kirsteins Social Worker Present: Dossie DerBecky Gaynell Eggleton, LCSW Nurse Present: Carlean PurlMaryann Barbour, RN PT Present: Jorje GuildBlair Hobble, PT;Caroline Adriana [place], PT OT Present: Rosalio LoudSarah Hoxie, OT SLP Present: Jackalyn LombardNicole Page, SLP PPS Coordinator present : Edson SnowballBecky Windsor, Chapman FitchPT;Marie Noel, RN, CRRN     Current Status/Progress Goal Weekly Team Focus  Medical   mobility improving, needs Min A for Aphasia  reduce fall risk  safety awareness   Bowel/Bladder   usually continent, incontinent at times, wears briefs from home, needs assist to change,  because of poor dexterity in left hand         Swallow/Nutrition/ Hydration     na        ADL's   supervision overall  goals set for mod I overall, supervision tub/shower transfer  Question whether pt will reach Mod I - with decreased balance, LUE pain/deficits, and h/o falls.  Focus on balance, safety with routine tasks, and LUE management (desensitization and edema management)   Mobility   Min A overall  Downgraded to supervision overall  Dynamic standing balance, safety awareness, gait training, patient/family education, fall recovery/floor transfers   Communication   min assist for word finding   mod I   carryover of compensatory strategies    Safety/Cognition/ Behavioral Observations  min assist for safety awareness, semi-complex problem solving   supervision   carryover during functional tasks    Pain   continues to complain of burning pain to right thigh and achy pain to LUE, meds being adjusted         Skin     WNL-no issues           *See Care Plan and progress notes for long and short-term goals.  Barriers to Discharge: poor safety awareness, getting up in room    Possible Resolutions to Barriers:  treat bladder urgency, safety plan    Discharge Planning/Teaching Needs:  Working on a plan for home-aware will need 24 hr supervision level for safety.  Discussing with daughter and son how much they can assist      Team Discussion:  Decreased balance and safety concerns-team feels she will need supervision level for safety at home.  Pain in thigh-checking dopplers and arm-chronic treating with medicines.  UA sent and checking.  Pushing through pain to participate in therapies and making good gains  Revisions to Treatment Plan:  Downgraded goals to supervision level-safety issues   Continued Need for Acute Rehabilitation Level of Care: The patient requires daily medical management by a physician with specialized training in physical medicine and rehabilitation for the  following conditions: Daily direction of a multidisciplinary physical rehabilitation program to ensure safe treatment while eliciting the highest outcome that is of practical value to the patient.: Yes Daily medical management of patient stability for increased activity during participation in an intensive rehabilitation regime.: Yes Daily analysis of laboratory values and/or radiology reports with any subsequent need for medication adjustment of medical intervention for : Neurological problems;Other  Kara Mendoza, Kara Mendoza 08/19/2013, 1:18 PM

## 2013-08-19 NOTE — Progress Notes (Signed)
Witnessed patient getting up out of bed with out calling, Assisted the patient to the bathroom. Educated patient on importance of using the call bell. Patient voiced understanding.  Saulo Anthis

## 2013-08-20 ENCOUNTER — Inpatient Hospital Stay (HOSPITAL_COMMUNITY): Payer: Medicare HMO | Admitting: Rehabilitation

## 2013-08-20 ENCOUNTER — Ambulatory Visit: Payer: Medicare HMO

## 2013-08-20 ENCOUNTER — Inpatient Hospital Stay (HOSPITAL_COMMUNITY): Payer: Medicare HMO | Admitting: Speech Pathology

## 2013-08-20 ENCOUNTER — Encounter (HOSPITAL_COMMUNITY): Payer: Medicare HMO

## 2013-08-20 ENCOUNTER — Encounter (HOSPITAL_COMMUNITY): Payer: Medicare HMO | Admitting: Occupational Therapy

## 2013-08-20 ENCOUNTER — Inpatient Hospital Stay (HOSPITAL_COMMUNITY): Payer: Medicare HMO | Admitting: Occupational Therapy

## 2013-08-20 DIAGNOSIS — I6330 Cerebral infarction due to thrombosis of unspecified cerebral artery: Secondary | ICD-10-CM

## 2013-08-20 DIAGNOSIS — G90519 Complex regional pain syndrome I of unspecified upper limb: Secondary | ICD-10-CM

## 2013-08-20 DIAGNOSIS — I10 Essential (primary) hypertension: Secondary | ICD-10-CM

## 2013-08-20 LAB — GLUCOSE, CAPILLARY
Glucose-Capillary: 106 mg/dL — ABNORMAL HIGH (ref 70–99)
Glucose-Capillary: 75 mg/dL (ref 70–99)
Glucose-Capillary: 98 mg/dL (ref 70–99)
Glucose-Capillary: 93 mg/dL (ref 70–99)

## 2013-08-20 LAB — URINE CULTURE
Colony Count: NO GROWTH
Culture: NO GROWTH

## 2013-08-20 MED ORDER — METFORMIN HCL 500 MG PO TABS
500.0000 mg | ORAL_TABLET | Freq: Every day | ORAL | Status: DC
  Administered 2013-08-20 – 2013-08-22 (×3): 500 mg via ORAL
  Filled 2013-08-20 (×4): qty 1

## 2013-08-20 NOTE — Progress Notes (Signed)
Speech Language Pathology Daily Session Note  Patient Details  Name: Kara HammockSheila L Matteo MRN: [id number] Date of Birth: [birth year]  Today's Date: 08/20/2013 Time: [id number] Time Calculation (min): 44 min  Short Term Goals: Week 1: SLP Short Term Goal 1 (Week 1): Patient will maintain fluency at sentence level using continuous phonation with 85% accuracy and minimal cues.  SLP Short Term Goal 2 (Week 1): Patient will utilize word-finding strategies consistently at conversational level (slow down, pause and think; describe word/circumlocute) with 80% accuracy. SLP Short Term Goal 3 (Week 1): Pt will improve semi-complex problem solving during functional tasks for 80% accuracy with supervision cues.  SLP Short Term Goal 4 (Week 1): Pt will improve safety awareness during functional tasks for 80% accuracy with supervision cues.    Skilled Therapeutic Interventions:  Pt was seen for skilled speech therapy targeting safety awareness and word finding.  Upon arrival, pt was seated upright in wheelchair.  Quick release belt was not in place and pt did not recall PT recommendations for use of quick release belt while in wheelchair secondary to multiple attempts to get up unassisted reported by nursing yesterday.  SLP facilitated the session with a semi-complex scavenger hunt task around the unit during which pt benefited from min verbal cues for recall of placement of targeted objects as well as the order in which she was expected to find objects (alternating between letters and numbers consecutively).  Pt also benefited from subtle, intermittent verbal cues for safety awareness and awareness of obstacles during functional ambulation.  Upon return to the therapy room, SLP engaged pt in a semi-complex generative naming task targeting functional word finding during which pt was >75% accurate with intermittent supervision level cues.    During functional conversations related to discharge, pt reported that she was  independent for medication and financial management and that her daughter has taken over her finances while inpatient.  SLP recommended that pt continue to have daughter provide assistance for financial as well as medication management upon discharge as pt presents with decreased error awareness during complex tasks.  Pt verbalized understanding and was in agreement with the aforementioned recommendations.  SLP will plan to address financial management during tomorrow's session or at next available appointment.  Continue per current plan of care.    FIM:  Comprehension Comprehension Mode: Auditory Comprehension: 5-Follows basic conversation/direction: With extra time/assistive device Expression Expression Mode: Verbal Expression: 5-Expresses basic needs/ideas: With extra time/assistive device Social Interaction Social Interaction: 5-Interacts appropriately 90% of the time - Needs monitoring or encouragement for participation or interaction. Problem Solving Problem Solving: 5-Solves basic 90% of the time/requires cueing < 10% of the time Memory Memory: 4-Recognizes or recalls 75 - 89% of the time/requires cueing 10 - 24% of the time FIM - Eating Eating Activity: 7: Complete independence:no helper  Pain Pain Assessment Pain Assessment: No/denies pain  Therapy/Group: Individual Therapy  Jackalyn LombardNicole Edras Wilford, M.A. CCC-SLP  Alexsis Kathman, Melanee SpryNicole L 08/20/2013, 3:49 PM

## 2013-08-20 NOTE — Progress Notes (Signed)
[place] [facility] PHYSICAL MEDICINE & REHABILITATION     PROGRESS NOTE    Subjective/Complaints: Discussed normal doppler Thigh disconfort is Lateral and anterior Recent weight gain, denies tight clothing  No foot pain  ROS:  No SOB, has Chest tenderness around loop recorder, aphasic so full review difficult Objective: Vital Signs: Blood pressure 104/71, pulse 71, temperature 98.3 F (36.8 C), temperature source Oral, resp. rate 18, height 5\' 2"  (1.575 m), weight 81.421 kg (179 lb 8 oz), SpO2 98.00%. No results found. No results found for this basename: WBC, HGB, HCT, PLT,  in the last 72 hours No results found for this basename: NA, K, CL, CO, GLUCOSE, BUN, CREATININE, CALCIUM,  in the last 72 hours CBG (last 3)   Recent Labs  08/19/13 1108 08/19/13 1702 08/19/13 2132  GLUCAP 95 105* 117*    Wt Readings from Last 3 Encounters:  08/19/13 81.421 kg (179 lb 8 oz)  08/09/13 78.7 kg (173 lb 8 oz)  08/09/13 78.7 kg (173 lb 8 oz)    Physical Exam:  General: no distress.  Eyes: EOM are normal.  Mouth: oral mucosa pink/moist  Neck: Normal range of motion. Neck supple. No thyromegaly present.  Cardiovascular: Normal rate and regular rhythm. No murmurs  Respiratory: Effort normal and breath sounds normal. No respiratory distress. No wheezes  GI: Soft. Bowel sounds are normal. She exhibits no distension.  Musculoskeletal:  Noted hypersensitivity to left forearm to palpation and simple ROM--allodynia at Left forearm and distal Left arm and elbow Neurological: She is alert and oriented x 3. . Follows all simple commands. Stuttering speech is persistent. LUE grossly 2+/5 with pain still an issue. LLE is 3 to 4-/5 prox to distal. Sensation 1/2 LUE. Distal sensory loss to LT in both legs below the knees but inconsistent. Right thigh with subjective sensory loss, lateral and anterior aspect. Speech dysarthric. Left facial weakness.  Psych:minimal anxiety, pleasant and  cooperative.   Assessment/Plan: 1. Functional deficits secondary to left parietal brain infarcts which require 3+ hours per day of interdisciplinary therapy in a comprehensive inpatient rehab setting. Physiatrist is providing close team supervision and 24 hour management of active medical problems listed below. Physiatrist and rehab team continue to assess barriers to discharge/monitor patient progress toward functional and medical goals. Discussed d/c 7/18 FIM: FIM - Bathing Bathing Steps Patient Completed: Chest;Right Arm;Left Arm;Abdomen;Front perineal area;Buttocks;Right upper leg;Left upper leg;Right lower leg (including foot);Left lower leg (including foot) Bathing: 5: Supervision: Safety issues/verbal cues  FIM - Upper Body Dressing/Undressing Upper body dressing/undressing steps patient completed: Thread/unthread right bra strap;Thread/unthread left bra strap;Hook/unhook bra;Thread/unthread right sleeve of pullover shirt/dresss;Thread/unthread left sleeve of pullover shirt/dress;Put head through opening of pull over shirt/dress;Pull shirt over trunk Upper body dressing/undressing: 6: More than reasonable amount of time FIM - Lower Body Dressing/Undressing Lower body dressing/undressing steps patient completed: Thread/unthread right underwear leg;Thread/unthread left underwear leg;Pull underwear up/down;Thread/unthread right pants leg;Thread/unthread left pants leg;Pull pants up/down;Fasten/unfasten pants;Don/Doff right sock;Don/Doff left sock Lower body dressing/undressing: 5: Supervision: Safety issues/verbal cues  FIM - Toileting Toileting steps completed by patient: Adjust clothing prior to toileting;Performs perineal hygiene;Adjust clothing after toileting Toileting Assistive Devices: Grab bar or rail for support Toileting: 6: Assistive device: No helper  FIM - Diplomatic Services operational officerToilet Transfers Toilet Transfers Assistive Devices: Grab bars;Cane Toilet Transfers: 5-To toilet/BSC: Supervision  (verbal cues/safety issues);5-From toilet/BSC: Supervision (verbal cues/safety issues)  FIM - Press photographerBed/Chair Transfer Bed/Chair Transfer Assistive Devices: Arm rests;Cane Bed/Chair Transfer: 6: Sit > Supine: No assist;5: Chair or W/C > Bed: Supervision (  verbal cues/safety issues);5: Bed > Chair or W/C: Supervision (verbal cues/safety issues)  FIM - Locomotion: Wheelchair Distance: 180 Locomotion: Wheelchair: 2: Travels 50 - 149 ft with supervision, cueing or coaxing FIM - Locomotion: Ambulation Locomotion: Ambulation Assistive Devices: Other (comment) (without AD) Ambulation/Gait Assistance: 4: Min guard Locomotion: Ambulation: 4: Travels 150 ft or more with minimal assistance (Pt.>75%)  Comprehension Comprehension Mode: Auditory Comprehension: 5-Understands basic 90% of the time/requires cueing < 10% of the time  Expression Expression Mode: Verbal Expression: 5-Expresses basic 90% of the time/requires cueing < 10% of the time.  Social Interaction Social Interaction: 5-Interacts appropriately 90% of the time - Needs monitoring or encouragement for participation or interaction.  Problem Solving Problem Solving: 4-Solves basic 75 - 89% of the time/requires cueing 10 - 24% of the time  Memory Memory: 4-Recognizes or recalls 75 - 89% of the time/requires cueing 10 - 24% of the time  Medical Problem List and Plan:  1. Functional deficits secondary to left brain infarcts with word finding deficits, weakness. Status post loop recorder to establish etiology of infarct   -some of deficits are old including her stuttering speech 2. DVT Prophylaxis/Anticoagulation:Left upper extremity doppler negative. Subcutaneous Lovenox. Monitor platelet counts and any signs of bleeding  3. Pain Management, left arm pain- Prob CRPS  -pt was seen by neurology in November who felt LUE pain was "musculoskeletal"  -it is entirely unclear when this began---some reports indicate 2008 while others state it's more  recently. The patient seems to indicate that it began more recently (POOR HISTORIAN HOWEVER)(also with right thigh pain)  -it appears to me to be consistent with CRPS. Not a great steroid candidate due to diabetes or stellate Ganglion block due to chronicity of problem or need for plavix  -will titrate lyrica further up to 100mg  tid, low dose nortriptyline added.   -R thigh pain no swelling- Negative LE duplex, may be meralgia paresthetica vs CVA related parasthesia  - Oxycodone as needed. Monitor with increased mobility  4. Mood/depression/anxiety: Patient on Zoloft 50 mg each bedtime prior to admission  5. Neuropsych: This patient is capable of making decisions on her own behalf.  6. Diabetes mellitus with peripheral neuropathy. Hemoglobin A1c 5.8. Check blood sugars a.c. and at bedtime. Sliding scale insulin. Patient on Glucophage 500 mg daily prior to admission. Good control at present 7. Hypertension. Cozaar 50 mg daily. Monitor with increased mobility. Patient had been on Hyzaar 100-25 daily prior to admission.  8. Hyperthyroidism. Continue Tapazole. TSH level 3.400  9. Hyperlipidemia. Zocor  LOS (Days) 6 A FACE TO FACE EVALUATION WAS PERFORMED  Erick Colace 08/20/2013 6:45 AM

## 2013-08-20 NOTE — Progress Notes (Signed)
Occupational Therapy Session Note  Patient Details  Name: Kara Mendoza MRN: [id number] Date of Birth: [birth year]  Today's Date: 08/20/2013 Time: [id number] and [id number] Time Calculation (min): 35 min and 32 min  Short Term Goals: Week 1:  OT Short Term Goal 1 (Week 1): STG=LTG (STG= LTG)  Skilled Therapeutic Interventions/Progress Updates:    Pt missed 25 mins due to reports of exhaustion, frustration and discouragement with perceived lack of progress.  Requesting therapist return after breakfast and terminate session early.  Majority of session comprised of pt voicing c/o frustration regarding lack of progress towards personal goals and concerns regarding long standing pain in LUE and no resolution with pain.  Reiterated to pt purpose of rehab and progress towards goals currently.  Pt ambulated to toilet with SPC with distant supervision and completed toileting and clothing management mod I with spontaneous use of LUE with no facial grimaces or moans of pain.  Pt donned pants while seated EOB with supervision and again incorporating LUE.  Pt requested to return to bed stating she was in too much pain.  Notified RN of pain and current level of frustration.  Also spoke with SW regarding pt's frustration and discouragement.  2nd session: Engaged in homemaking tasks with focus on laundry and simple meal prep.  Simulated laundry task from w/c with sit > stand to load and unload washing machine, educated on use of w/c for safety with transporting laundry basket in lap.  Encouraged pt to trial carrying item in Lt arm while managing SPC and maintaining balance to simulate carrying laundry basket, pt reports understanding of increased challenge on balance while attempting to maintain additional items therefore truly understanding need for w/c or 2nd person to assist.  Simulated simple meal prep with education regarding transporting items along counter to increase safety, placing frequently used items in  easy to reach places.  Explained to pt recommendation for supervision for meal prep due to decreased memory and safety awareness, to which pt reports understanding.  Pt in much better spirits during PM session.   Therapy Documentation Precautions:  Precautions Precautions: Fall Precaution Comments: LUE pain Restrictions Weight Bearing Restrictions: No General: General Amount of Missed OT Time (min): 25 Minutes Vital Signs:   Pain:  Pt with c/o pain, reporting pain 6/10 in head and constant pain in LUE, unrated  See FIM for current functional status  Therapy/Group: Individual Therapy  Rosalio LoudHOXIE, Yenesis Even 08/20/2013, 10:31 AM

## 2013-08-20 NOTE — Progress Notes (Signed)
Physical Therapy Session Note  Patient Details  Name: Kara Mendoza MRN: [id number] Date of Birth: 1950-08-04  Today's Date: 08/20/2013 Time: 1100-1200 Time Calculation (min): 60 min  Short Term Goals: Week 1:  PT Short Term Goal 1 (Week 1): STG=LTG  Skilled Therapeutic Interventions/Progress Updates:   Pt received lying in bed this morning, agreeable to therapy.  Performed bed mobility with HOB flat and without rails at Mod I level.  Ambulated >150' in controlled and home environment with SPC at S level.  Skilled session focused on gait training (for when family is present and due to pt will likely ambulate at home, despite recommendations) w/ SPC in home, tight spaces and controlled environment.  Even practiced pts reaction time if "dog" were to jump out in front of her.  No LOB noted and pt able to stop.  Also worked on w/c mobility in tight spaces, discussed safety with w/c with PT leaving w/c unlocked intentionally throughout session to assess safety.  She did not check brakes on single occasion and requires hinting cues to check before sitting.  Discussed backing w/c up beside of bed so that when she needs to get up it will be there.  Also discussed getting into w/c to get to bathroom in the night.  She verbalized understanding to all.  Worked on stair negotiation with single handrail and cane.  She was able to perform at S level.  Remainder of session focused on balance and standing tolerance.  She was able to make up bed in ADL apt (very meticulously) at S level without AD and perform standing on upside down BOSU ball without UE support at min/guard level.  Min cues for ankle and hip strategy but did better with increased time.  Pt ambulated back to room and used restroom, wash/dry hands, brushed teeth and back to chair all at S level.  Left with all needs in reach.     Therapy Documentation Precautions:  Precautions Precautions: Fall Precaution Comments: LUE pain Restrictions Weight  Bearing Restrictions: No   Pain: Pt with pain in LUE, however was able to tolerate session.    Locomotion : Ambulation Ambulation/Gait Assistance: 5: Supervision Wheelchair Mobility Distance: 100   See FIM for current functional status  Therapy/Group: Individual Therapy  Vista Deckarcell, Sharlynn Seckinger Ann 08/20/2013, 12:48 PM

## 2013-08-21 ENCOUNTER — Inpatient Hospital Stay (HOSPITAL_COMMUNITY): Payer: Medicare HMO | Admitting: Speech Pathology

## 2013-08-21 ENCOUNTER — Inpatient Hospital Stay (HOSPITAL_COMMUNITY): Payer: Medicare HMO | Admitting: Occupational Therapy

## 2013-08-21 ENCOUNTER — Encounter (HOSPITAL_COMMUNITY): Payer: Medicare HMO | Admitting: Occupational Therapy

## 2013-08-21 ENCOUNTER — Inpatient Hospital Stay (HOSPITAL_COMMUNITY): Payer: Medicare HMO | Admitting: Physical Therapy

## 2013-08-21 DIAGNOSIS — I6330 Cerebral infarction due to thrombosis of unspecified cerebral artery: Secondary | ICD-10-CM

## 2013-08-21 DIAGNOSIS — G90519 Complex regional pain syndrome I of unspecified upper limb: Secondary | ICD-10-CM

## 2013-08-21 DIAGNOSIS — I10 Essential (primary) hypertension: Secondary | ICD-10-CM

## 2013-08-21 LAB — GLUCOSE, CAPILLARY
Glucose-Capillary: 101 mg/dL — ABNORMAL HIGH (ref 70–99)
Glucose-Capillary: 100 mg/dL — ABNORMAL HIGH (ref 70–99)
Glucose-Capillary: 113 mg/dL — ABNORMAL HIGH (ref 70–99)
Glucose-Capillary: 124 mg/dL — ABNORMAL HIGH (ref 70–99)

## 2013-08-21 MED ORDER — OMEPRAZOLE 20 MG PO CPDR: 20.0000 mg | DELAYED_RELEASE_CAPSULE | Freq: Two times a day (BID) | ORAL | Status: DC

## 2013-08-21 MED ORDER — SERTRALINE HCL 50 MG PO TABS: 50.0000 mg | ORAL_TABLET | Freq: Every day | ORAL | Status: DC

## 2013-08-21 MED ORDER — METRONIDAZOLE 500 MG PO TABS: 500.0000 mg | ORAL_TABLET | Freq: Two times a day (BID) | ORAL | Status: DC

## 2013-08-21 MED ORDER — NORTRIPTYLINE HCL 10 MG PO CAPS: 10.0000 mg | ORAL_CAPSULE | Freq: Every day | ORAL | Status: DC

## 2013-08-21 MED ORDER — OXYCODONE-ACETAMINOPHEN 5-325 MG PO TABS: 1.0000 | ORAL_TABLET | Freq: Three times a day (TID) | ORAL | Status: DC | PRN

## 2013-08-21 MED ORDER — SIMVASTATIN 40 MG PO TABS: 40.0000 mg | ORAL_TABLET | Freq: Every day | ORAL | Status: DC

## 2013-08-21 MED ORDER — CLOPIDOGREL BISULFATE 75 MG PO TABS: 75.0000 mg | ORAL_TABLET | Freq: Every day | ORAL | Status: DC

## 2013-08-21 MED ORDER — DULOXETINE HCL 20 MG PO CPEP: 20.0000 mg | ORAL_CAPSULE | Freq: Every day | ORAL | Status: DC

## 2013-08-21 MED ORDER — LOSARTAN POTASSIUM-HCTZ 100-25 MG PO TABS
1.0000 | ORAL_TABLET | Freq: Every day | ORAL | Status: DC
Start: 2013-08-21 — End: 2015-06-05

## 2013-08-21 MED ORDER — METHIMAZOLE 5 MG PO TABS: 5.0000 mg | ORAL_TABLET | Freq: Every day | ORAL | Status: DC

## 2013-08-21 MED ORDER — METFORMIN HCL 500 MG PO TABS: 500.0000 mg | ORAL_TABLET | Freq: Every day | ORAL | Status: DC

## 2013-08-21 MED ORDER — PREGABALIN 100 MG PO CAPS: 100.0000 mg | ORAL_CAPSULE | Freq: Three times a day (TID) | ORAL | Status: DC

## 2013-08-21 NOTE — Progress Notes (Signed)
Speech Language Pathology Daily Session Note  Patient Details  Name: Kara Mendoza MRN: [id number] Date of Birth: Jun 16, 1950  Today's Date: 08/21/2013 Time: [id number] Time Calculation (min): 45 min  Short Term Goals: Week 1: SLP Short Term Goal 1 (Week 1): Patient will maintain fluency at sentence level using continuous phonation with 85% accuracy and minimal cues.  SLP Short Term Goal 2 (Week 1): Patient will utilize word-finding strategies consistently at conversational level (slow down, pause and think; describe word/circumlocute) with 80% accuracy. SLP Short Term Goal 3 (Week 1): Pt will improve semi-complex problem solving during functional tasks for 80% accuracy with supervision cues.  SLP Short Term Goal 4 (Week 1): Pt will improve safety awareness during functional tasks for 80% accuracy with supervision cues.    Skilled Therapeutic Interventions:  Pt was seen for skilled speech therapy targeting executive function for financial management.  Upon arrival, pt was seated upright in wheelchair and was awake, alert, agreeable to participate in ST.  Pt propelled her wheelchair to the speech office with supervision cues for recall of wayfinding.  SLP engaged pt in basic and semi-complex financial management tasks as pt was independent for managing her finances prior to admission.  Pt was 100% accurate for sorting transactions into categories based on whether they were payments or credits and by date with modified independence.  Additionally, pt required mod assist cues for organization and error awareness to complete functional calculations to balance a checkbook with ~75% accuracy.  SLP reviewed and reinforced recommendations for pt to have assistance for medication and financial management upon discharge as well as supervision for safety.  Upon return to pt's room, pt benefited from supervision-min verbal cues for safety while ambulating to bathroom.  Pt left upright in wheelchair with quick  release belt on, all needs left within reach.    FIM:  Comprehension Comprehension Mode: Auditory Comprehension: 5-Follows basic conversation/direction: With extra time/assistive device Expression Expression Mode: Verbal Expression: 5-Expresses basic needs/ideas: With no assist Social Interaction Social Interaction: 5-Interacts appropriately 90% of the time - Needs monitoring or encouragement for participation or interaction. Problem Solving Problem Solving: 4-Solves basic 75 - 89% of the time/requires cueing 10 - 24% of the time Memory Memory: 4-Recognizes or recalls 75 - 89% of the time/requires cueing 10 - 24% of the time  Pain Pain Assessment Pain Assessment: No/denies pain  Therapy/Group: Individual Therapy  Kara Mendoza, M.A. CCC-SLP  Everley Evora, Melanee SpryNicole L 08/21/2013, 4:15 PM

## 2013-08-21 NOTE — Progress Notes (Signed)
[place] [facility] PHYSICAL MEDICINE & REHABILITATION     PROGRESS NOTE    Subjective/Complaints: No new problems. Left arm still tender.  No foot pain  ROS:  No SOB, has Chest tenderness around loop recorder, aphasic so full review difficult Objective: Vital Signs: Blood pressure 102/70, pulse 78, temperature 98.3 F (36.8 C), temperature source Oral, resp. rate 18, height 5\' 2"  (1.575 m), weight 81.421 kg (179 lb 8 oz), SpO2 97.00%. No results found. No results found for this basename: WBC, HGB, HCT, PLT,  in the last 72 hours No results found for this basename: NA, K, CL, CO, GLUCOSE, BUN, CREATININE, CALCIUM,  in the last 72 hours CBG (last 3)   Recent Labs  08/20/13 1621 08/20/13 2203 08/21/13 0714  GLUCAP 98 93 101*    Wt Readings from Last 3 Encounters:  08/19/13 81.421 kg (179 lb 8 oz)  08/09/13 78.7 kg (173 lb 8 oz)  08/09/13 78.7 kg (173 lb 8 oz)    Physical Exam:  General: no distress.  Eyes: EOM are normal.  Mouth: oral mucosa pink/moist  Neck: Normal range of motion. Neck supple. No thyromegaly present.  Cardiovascular: Normal rate and regular rhythm. No murmurs  Respiratory: Effort normal and breath sounds normal. No respiratory distress. No wheezes  GI: Soft. Bowel sounds are normal. She exhibits no distension.  Musculoskeletal:  Noted hypersensitivity to left forearm to palpation and simple ROM--allodynia at Left forearm and distal Left arm and elbow Neurological: She is alert and oriented x 3. . Follows all simple commands. Stuttering speech is persistent. LUE grossly 2+/5 with pain still an issue. LLE is 3 to 4-/5 prox to distal. Sensation 1/2 LUE. Distal sensory loss to LT in both legs below the knees but inconsistent. Right thigh with subjective sensory loss, lateral and anterior aspect. Speech dysarthric. Left facial weakness.  Psych:minimal anxiety, pleasant and cooperative.   Assessment/Plan: 1. Functional deficits secondary to left parietal brain  infarcts which require 3+ hours per day of interdisciplinary therapy in a comprehensive inpatient rehab setting. Physiatrist is providing close team supervision and 24 hour management of active medical problems listed below. Physiatrist and rehab team continue to assess barriers to discharge/monitor patient progress toward functional and medical goals.  Dc tomorrow  FIM: FIM - Bathing Bathing Steps Patient Completed: Chest;Right Arm;Left Arm;Abdomen;Front perineal area;Buttocks;Right upper leg;Left upper leg;Right lower leg (including foot);Left lower leg (including foot) Bathing: 6: Assistive device (Comment) (sit > stand from tub bench)  FIM - Upper Body Dressing/Undressing Upper body dressing/undressing steps patient completed: Thread/unthread right bra strap;Thread/unthread left bra strap;Hook/unhook bra;Thread/unthread right sleeve of pullover shirt/dresss;Thread/unthread left sleeve of pullover shirt/dress;Put head through opening of pull over shirt/dress;Pull shirt over trunk Upper body dressing/undressing: 6: More than reasonable amount of time FIM - Lower Body Dressing/Undressing Lower body dressing/undressing steps patient completed: Thread/unthread right underwear leg;Thread/unthread left underwear leg;Pull underwear up/down;Thread/unthread right pants leg;Thread/unthread left pants leg;Pull pants up/down;Don/Doff right sock;Don/Doff left sock Lower body dressing/undressing: 5: Supervision: Safety issues/verbal cues  FIM - Toileting Toileting steps completed by patient: Adjust clothing prior to toileting;Performs perineal hygiene;Adjust clothing after toileting Toileting Assistive Devices: Grab bar or rail for support Toileting: 6: Assistive device: No helper  FIM - Diplomatic Services operational officerToilet Transfers Toilet Transfers Assistive Devices: Grab bars;Cane Toilet Transfers: 5-To toilet/BSC: Supervision (verbal cues/safety issues);5-From toilet/BSC: Supervision (verbal cues/safety issues)  FIM -  Press photographerBed/Chair Transfer Bed/Chair Transfer Assistive Devices: Arm rests;Cane Bed/Chair Transfer: 6: Supine > Sit: No assist;5: Bed > Chair or W/C: Supervision (verbal cues/safety issues)  FIM - Locomotion: Wheelchair Distance: 100 Locomotion: Wheelchair: 2: Travels 50 - 149 ft with supervision, cueing or coaxing FIM - Locomotion: Ambulation Locomotion: Ambulation Assistive Devices: Emergency planning/management officer Ambulation/Gait Assistance: 5: Supervision Locomotion: Ambulation: 5: Travels 150 ft or more with supervision/safety issues  Comprehension Comprehension Mode: Auditory Comprehension: 5-Follows basic conversation/direction: With extra time/assistive device  Expression Expression Mode: Verbal Expression: 5-Expresses basic needs/ideas: With no assist  Social Interaction Social Interaction: 5-Interacts appropriately 90% of the time - Needs monitoring or encouragement for participation or interaction.  Problem Solving Problem Solving: 5-Solves basic 90% of the time/requires cueing < 10% of the time  Memory Memory: 4-Recognizes or recalls 75 - 89% of the time/requires cueing 10 - 24% of the time  Medical Problem List and Plan:  1. Functional deficits secondary to left brain infarcts with word finding deficits, weakness. Status post loop recorder to establish etiology of infarct     2. DVT Prophylaxis/Anticoagulation:Left upper extremity doppler negative. Subcutaneous Lovenox. Monitor platelet counts and any signs of bleeding  3. Pain Management, left arm pain- Prob CRPS  -pt was seen by neurology in November who felt LUE pain was "musculoskeletal"  -it is entirely unclear when this began---some reports indicate 2008 while others state it's more recently. The patient seems to indicate that it began more recently (POOR HISTORIAN HOWEVER)(also with right thigh pain)  -it appears to me to be consistent with CRPS.   -will titrate lyrica further up to 100mg  tid, low dose nortriptyline added.   -R thigh  pain no swelling- Negative LE duplex, may be meralgia paresthetica vs CVA related parasthesia  - Oxycodone as needed. Monitor with increased mobility  4. Mood/depression/anxiety: Patient on Zoloft 50 mg each bedtime prior to admission  5. Neuropsych: This patient is capable of making decisions on her own behalf.  6. Diabetes mellitus with peripheral neuropathy. Hemoglobin A1c 5.8. Check blood sugars a.c. and at bedtime. Sliding scale insulin. Patient on Glucophage 500 mg daily prior to admission. Good control at present 7. Hypertension. Cozaar 50 mg daily. Monitor with increased mobility. Patient had been on Hyzaar 100-25 daily prior to admission.  8. Hyperthyroidism. Continue Tapazole. TSH level 3.400  9. Hyperlipidemia. Zocor  LOS (Days) 7 A FACE TO FACE EVALUATION WAS PERFORMED  SWARTZ,ZACHARY T 08/21/2013 8:26 AM

## 2013-08-21 NOTE — Discharge Instructions (Signed)
Inpatient Rehab Discharge Instructions  Kara HammockSheila L Mendoza Discharge date and time: No discharge date for patient encounter.   Activities/Precautions/ Functional Status: Activity: activity as tolerated Diet: diabetic diet Wound Care: none needed Functional status:  ___ No restrictions     ___ Walk up steps independently ___ 24/7 supervision/assistance   ___ Walk up steps with assistance ___ Intermittent supervision/assistance  ___ Bathe/dress independently ___ Walk with walker     ___ Bathe/dress with assistance ___ Walk Independently    ___ Shower independently __x STROKE/TIA DISCHARGE INSTRUCTIONS SMOKING Cigarette smoking nearly doubles your risk of having a stroke & is the single most alterable risk factor  If you smoke or have smoked in the last 12 months, you are advised to quit smoking for your health.  Most of the excess cardiovascular risk related to smoking disappears within a year of stopping.  Ask you doctor about anti-smoking medications  [place] Quit Line: 1-800-QUIT NOW  Free Smoking Cessation Classes [phone]  CHOLESTEROL Know your levels; limit fat & cholesterol in your diet  Lipid Panel     Component Value Date/Time   CHOL 208* 08/10/2013 0848   TRIG 135 08/10/2013 0848   HDL 53 08/10/2013 0848   CHOLHDL 3.9 08/10/2013 0848   VLDL 27 08/10/2013 0848   LDLCALC 128* 08/10/2013 0848      Many patients benefit from treatment even if their cholesterol is at goal.  Goal: Total Cholesterol (CHOL) less than 160  Goal:  Triglycerides (TRIG) less than 150  Goal:  HDL greater than 40  Goal:  LDL (LDLCALC) less than 100   BLOOD PRESSURE American Stroke Association blood pressure target is less that 120/80 mm/Hg  Your discharge blood pressure is:  BP: 104/71 mmHg  Monitor your blood pressure  Limit your salt and alcohol intake  Many individuals will require more than one medication for high blood pressure  DIABETES (A1c is a blood sugar average for last 3 months) Goal  HGBA1c is under 7% (HBGA1c is blood sugar average for last 3 months)  Diabetes:     Lab Results  Component Value Date   HGBA1C 5.8* 08/10/2013     Your HGBA1c can be lowered with medications, healthy diet, and exercise.  Check your blood sugar as directed by your physician  Call your physician if you experience unexplained or low blood sugars.  PHYSICAL ACTIVITY/REHABILITATION Goal is 30 minutes at least 4 days per week  Activity: Increase activity slowly, Therapies: Physical Therapy: Home Health Return to work:   Activity decreases your risk of heart attack and stroke and makes your heart stronger.  It helps control your weight and blood pressure; helps you relax and can improve your mood.  Participate in a regular exercise program.  Talk with your doctor about the best form of exercise for you (dancing, walking, swimming, cycling).  DIET/WEIGHT Goal is to maintain a healthy weight  Your discharge diet is: Carb Control  liquids Your height is:  Height: 5\' 2"  (157.5 cm) Your current weight is: Weight: 81.421 kg (179 lb 8 oz) Your Body Mass Index (BMI) is:  BMI (Calculated): 30  Following the type of diet specifically designed for you will help prevent another stroke.  Your goal weight range is:    Your goal Body Mass Index (BMI) is 19-24.  Healthy food habits can help reduce 3 risk factors for stroke:  High cholesterol, hypertension, and excess weight.  RESOURCES Stroke/Support Group:  Call [phone]   STROKE EDUCATION PROVIDED/REVIEWED AND  GIVEN TO PATIENT Stroke warning signs and symptoms How to activate emergency medical system (call 911). Medications prescribed at discharge. Need for follow-up after discharge. Personal risk factors for stroke. Pneumonia vaccine given:  Flu vaccine given:  My questions have been answered, the writing is legible, and I understand these instructions.  I will adhere to these goals & educational materials that have been provided to me  after my discharge from the hospital.    _ Walk with assistance    ___ Shower with assistance ___ No alcohol     ___ Return to work/school ________  Special Instructions:    COMMUNITY REFERRALS UPON DISCHARGE:    Home Health:   PT, RN, OT, SP, SW  Agency:[facility] Phone:[phone] Date of last service:08/22/2013  Medical Equipment/Items Ordered:WHEELCHAIR & TUB BENCH  Agency/Supplier:[employer]  [phone] Other:[facility] RESUME SREVICES  GENERAL COMMUNITY RESOURCES FOR PATIENT/FAMILY: Support Groups:[facility] SUPPORT GROUP   My questions have been answered and I understand these instructions. I will adhere to these goals and the provided educational materials after my discharge from the hospital.  Patient/Caregiver Signature _______________________________ Date __________  Clinician Signature _______________________________________ Date __________  Please bring this form and your medication list with you to all your follow-up doctor's appointments.

## 2013-08-21 NOTE — Progress Notes (Signed)
Social Work Patient ID: Marolyn HammockSheila L Kisner, female   DOB: 12-Nov-1950, 63 y.o.   MRN: [id number] Spoke with Brandy-daughter who will be here tomorrow at 10;30 to go through family education.  She reports her Mom will be at her home after the weekend. Discussed the team's recommendation for 24 hour supervision unless in wheelchair then mod/i level.  She is aware along with her brother/pt's son and pt. Pt's DME going to the hospital today and follow up arranged.  Pt is able to make her won decisions but hopefully she will use good judgements regarding being at home alone.

## 2013-08-21 NOTE — Progress Notes (Signed)
Occupational Therapy Session Note  Patient Details  Name: Marolyn HammockSheila L Bertsch MRN: [id number] Date of Birth: 1950-03-21  Today's Date: 08/21/2013 Time: 0732-0830 and [id number] Time Calculation (min): 58 min and 40 min  Short Term Goals: Week 1:  OT Short Term Goal 1 (Week 1): STG=LTG (STG= LTG)  Skilled Therapeutic Interventions/Progress Updates:    1) Pt completed self-care retraining at overall supervision/mod I level in ADL apartment.  Pt supervision with transfers and use of SPC due to decreased balance, safety awareness, and impaired memory, however mod I with bathing at sit > stand level.  Pt with increased recall with locking w/c brakes this session and use of tub bench for tub/shower transfer also able to recall recommendations for sit > stand with LB dressing to increase safety.  Continue to recommend supervision with tub/shower transfer and ambulation due to memory deficits.  2) Pt reports increase in pain in LUE and hesitant to participate, however with mod encouragement pt willing to participate.  [facility] and distant supervision with no LOB.  Pt ambulated to therapy gym to engage in dynamic standing balance and stepping activity while completing Wii bowling activity.  Pt completed multiple sit > stands without UE support with each turn during activity and engaged in stepping pattern with supervision and no LOB despite occasional overcorrecting. Pt with improved demeanor as session progressed.  Therapy Documentation Precautions:  Precautions Precautions: Fall Precaution Comments: LUE pain Restrictions Weight Bearing Restrictions: No General:   Vital Signs:   Pain:  Pt with no c/o pain this session, however groans occasionally with functional use of LUE  See FIM for current functional status  Therapy/Group: Individual Therapy  Rosalio LoudHOXIE, Gussie Murton 08/21/2013, 10:35 AM

## 2013-08-21 NOTE — Progress Notes (Signed)
Occupational Therapy Discharge Summary  Patient Details  Name: Kara Mendoza MRN: [id number] Date of Birth: 15-Jul-1950  Today's Date: 08/21/2013  Patient has met 68 of 14 long term goals due to improved activity tolerance, improved balance, postural control, ability to compensate for deficits, functional use of  LEFT upper extremity, improved attention and improved awareness.  Patient to discharge at overall Supervision level for transfers, ambulation, and homemaking tasks, Modified Independent for self-care tasks at a seated or sit > stand level.  Patient's care partner is independent to provide the necessary assistance at discharge.    Reasons goals not met: N/A  Recommendation:  Patient will benefit from ongoing skilled OT services in home health setting to continue to advance functional skills in the area of BADL, iADL and Reduce care partner burden.  Equipment: tub transfer bench  Reasons for discharge: treatment goals met and discharge from hospital  Patient/family agrees with progress made and goals achieved: Yes  OT Discharge Precautions/Restrictions  Precautions Precaution Comments: LUE pain Restrictions Weight Bearing Restrictions: No General   Vital Signs Therapy Vitals Temp: 98.4 F (36.9 C) Temp src: Oral Pulse Rate: 85 Resp: 18 BP: 118/74 mmHg Patient Position (if appropriate): Sitting Oxygen Therapy SpO2: 98 % O2 Device: None (Room air) Pain  Pt with no c/o pain ADL  See FIM Vision/Perception    reports some double vision intermittently Cognition Orientation Level: Oriented X4 Sensation   Hypersensitive on LUE, decreased sensation RLE Extremity/Trunk Assessment  RUE Assessment RUE Assessment: WFL LUE Assessment LUE Assessment: Exceptions to WFL LUE AROM (degrees) LUE Overall AROM Comments: Pt has increased pain (allodynia) in LUE which impairs functional use  See FIM for current functional status  Nadiyah Zeis, [facility] 08/21/2013, 2:39 PM

## 2013-08-21 NOTE — Progress Notes (Signed)
Physical Therapy Session Note  Patient Details  Name: Kara Mendoza MRN: [id number] Date of Birth: [birth year]  Today's Date: 08/21/2013 Time: [id number] Time Calculation (min): 44 min  Short Term Goals: Week 1:  PT Short Term Goal 1 (Week 1): STG=LTG  Skilled Therapeutic Interventions/Progress Updates:   Pt received sitting in w/c with quick release belt on. Session focused on ambulation, w/c propulsion, and transfers in preparation for discharge. Pt at overall supervision level for ambulation using SPC and mod I at w/c level. Pt performed bed mobility in ADL apartment, bed <> chair transfer, and sit<>stands from furniture with mod I. Performed car transfer with supervision. Negotiated up/down 8 stairs using 1 rail and SPC, supervision. W/c mobility with mod I x 150 ft in controlled environment and x 75 ft in home environment. Gait training with use of SPC throughout rehab unit x >500 ft, in home environment x 75 ft, and community/outdoor environment 2 x >1000 ft. Pt ambulated from room > elevator > hospital lobby > outdoors > gift shop > elevator > room without requiring any seated rest breaks. Pt negotiated on/off elevators, in mildly crowded hospital lobby, inclines/declines, on grass, concrete and brick surfaces, and up/down 5 brick steps with use of rail and SPC, overall supervision and no LOB. Pt negotiated up/down curb using SPC x 2, supervision. Pt with appropriate concerns regarding discharge, stating she did not want to fall or have another stroke, and able to name prevention techniques. Pt verbalizing agreement with recommendations for w/c level when supervision for ambulation not available. Pt left sitting in w/c with quick release belt on and all needs within reach.    Therapy Documentation Precautions:  Precautions Precautions: Fall Precaution Comments: LUE pain Restrictions Weight Bearing Restrictions: No Pain:  Unrated intermittent LUE pain  See FIM for current functional  status  Therapy/Group: Individual Therapy  Kerney ElbeVarner, Khoury Siemon A 08/21/2013, 10:15 AM

## 2013-08-21 NOTE — Progress Notes (Signed)
Physical Therapy Discharge Summary  Patient Details  Name: Kara Mendoza MRN: [id number] Date of Birth: 05-29-1950  Today's Date: 08/21/2013 1035-1110 35 minutes  Patient has met 14 of 14 long term goals due to improved activity tolerance, improved balance, improved postural control, increased strength, ability to compensate for deficits, functional use of LUE, improved attention, and improved awareness. Patient to discharge at an ambulatory level Supervision, w/c level mod I when supervision unavailable.   Patient's care partner is independent to provide the necessary physical assistance at discharge.  Reasons goals not met: NA  Recommendation:  Patient will benefit from ongoing skilled PT services in home health setting to continue to advance safe functional mobility, address ongoing impairments in balance, gait, postural control, strength, functional use of LUE, and minimize fall risk.  Equipment: wheelchair  Reasons for discharge: treatment goals met and discharge from hospital  Patient/family agrees with progress made and goals achieved: Yes  Skilled Intervention Focus on family training with patient's daughter and 2 granddaughters present for session. Patient at overall S level for ambulation using SPC and mod I from w/c. Instructed patient and family on w/c and parts management, floor transfer and safety - when to call EMS vs get up with assist from family, curb negotiation, car transfer, stairs, gait, transfers, and bed mobility. Answered all patient and family questions regarding d/c, DME, and f/u therapy. Pt and daughter verbalize understanding and in agreement with rehab recommendations. No further questions regarding d/c.   PT Discharge Precautions/Restrictions Restrictions Weight Bearing Restrictions: No Vital Signs Therapy Vitals Temp: 98.4 F (36.9 C) Temp src: Oral Pulse Rate: 85 Resp: 18 BP: 118/74 mmHg Patient Position (if appropriate): Sitting Oxygen  Therapy SpO2: 98 % O2 Device: None (Room air) Pain Pain Assessment Pain Assessment: No/denies pain  Cognition Orientation Level: Oriented X4 Sensation Sensation Light Touch: Impaired Detail (numbness and tingling ) Light Touch Impaired Details: Impaired RLE Coordination Gross Motor Movements are Fluid and Coordinated: No Fine Motor Movements are Fluid and Coordinated: No Coordination and Movement Description:  (Pt. primarliy uses RUE and LUE as active assist) Motor  Motor Motor: Abnormal postural alignment and control (LUE nerve pain from carpel tunnel syndrome)  Mobility Bed Mobility Bed Mobility: Supine to Sit;Sit to Supine Supine to Sit: 6: Modified independent (Device/Increase time);HOB flat Sit to Supine: HOB flat;6: Modified independent (Device/Increase time) Transfers Transfers: Yes Stand Pivot Transfers: 6: Modified independent (Device/Increase time) Locomotion  Ambulation Ambulation: Yes Ambulation/Gait Assistance: 5: Supervision Ambulation Distance (Feet): 500 Feet Assistive device: Straight cane Gait Gait: Yes Gait Pattern: Within Functional Limits Gait velocity: normal High Level Ambulation High Level Ambulation: Head turns;Side stepping;Backwards walking Side Stepping: S Backwards Walking: S Head Turns: S Stairs / Additional Locomotion Stairs: Yes Stairs Assistance: 5: Supervision Stair Management Technique: One rail Left;With cane Number of Stairs: 8 Height of Stairs: 6 Curb: 5: Supervision (with [facility]) Wheelchair Mobility Wheelchair Mobility: No (pt ambulatory)  Trunk/Postural Assessment  Cervical Assessment Cervical Assessment: Within Functional Limits Thoracic Assessment Thoracic Assessment: Within Functional Limits Lumbar Assessment Lumbar Assessment: Within Functional Limits Postural Control Postural Control: Within Functional Limits  Balance Balance Balance Assessed: Yes Standardized Balance Assessment Standardized Balance Assessment:  Dynamic Gait Index Dynamic Gait Index Level Surface: Normal Change in Gait Speed: Mild Impairment Gait with Horizontal Head Turns: Moderate Impairment Gait with Vertical Head Turns: Mild Impairment Gait and Pivot Turn: Severe Impairment Step Over Obstacle: Normal Step Around Obstacles: Normal Steps: Moderate Impairment Total Score: 15 Extremity Assessment  RUE Assessment RUE  Assessment: Within Functional Limits LUE Assessment LUE Assessment: Exceptions to WFL LUE AROM (degrees) LUE Overall AROM Comments: Pt has increased pain (allodynia) in LUE which impairs functional use RLE Assessment RLE Assessment: Within Functional Limits LLE Assessment LLE Assessment: Within Functional Limits  See FIM for current functional status  Laretta Alstrom 08/21/2013, 6:14 PM

## 2013-08-21 NOTE — Discharge Summary (Signed)
Discharge summary job [id number]

## 2013-08-21 NOTE — Progress Notes (Signed)
Social Work Discharge Note Discharge Note  The overall goal for the admission was met for:   Discharge location: Yes-HOME WITH DAUGHTER UNSURE FOR HOW LONG-QUES THRU THE WEEKEND  Length of Stay: Yes-8 DAYS  Discharge activity level: Yes-MOD/I W/C LEVEL -SUPERVISION LEVEL WITH CANE  Home/community participation: Yes  Services provided included: MD, RD, PT, OT, SLP, RN, CM, TR, Pharmacy, Neuropsych and SW  Financial Services: Medicaid and Private Insurance: [place]  Follow-up services arranged: Home Health: [place] [facility]-PT,OT,RN,SW,SP, DME: [employer]-WHEELCHAIR AND TUB BENCH and Patient/Family request agency HH: PREF [facility] USED BEFORE, DME: INS PREF  Comments (or additional information):FAMILY EDUCATION COMPLETED ON DAY OF DISCHARGE.  ALL AWARE OF THE RECOMMENDATION FOR 24 HR SUPERVISION, UNSURE IF WILL HAVE THIS AT HOME. RESUME PCS-AIDE.  PT IS ABLE TO MAKE HER OWN DECISIONS BUT MAY NOT BE THE BEST.   Patient/Family verbalized understanding of follow-up arrangements: Yes  Individual responsible for coordination of the follow-up plan: SELF & BRANDY-DAUGTHER  Confirmed correct DME delivered: Kara Mendoza 08/21/2013    Kara Mendoza

## 2013-08-21 NOTE — Discharge Summary (Signed)
NAMEFAITHLYNN, DEELEY                ACCOUNT NO.:  [id number]  MEDICAL RECORD NO.:  [id number]  LOCATION:  4M03C                        FACILITY:  [facility]  PHYSICIAN:  Erick Colace, M.D.DATE OF BIRTH:  02/10/1950  DATE OF ADMISSION:  08/12/2013 DATE OF DISCHARGE:  08/22/2013                              DISCHARGE SUMMARY   DISCHARGE DIAGNOSES: 1. Functional deficits secondary to the left brain infarctions. 2. Subcutaneous Lovenox for deep vein thrombosis prophylaxis. 3. Pain management with probable complex regional pain syndrome. 4. Depression. 5. Diabetes mellitus with peripheral neuropathy. 6. Hypertension. 7. Hyperthyroidism. 8. Hyperlipidemia.  HISTORY OF PRESENT ILLNESS:  This is a 63 year old right-handed female with history of left temporoparietal infarction with residual dysarthria, aphasia in 2008, maintained on Plavix for CVA prophylaxis who was independent with a cane prior to admission, living alone, had a home health aid 2 hours a day.  Admitted August 09, 2013, with increased aphasia.  MRI of the brain showed extension of acute infarcts left parietal operculum, left insula, and inferior parietal lobe.  MRA of the head without occlusion.  CTA of head and neck negative.  Echocardiogram with ejection fraction of 60%.  No wall motion abnormalities.  The patient did not receive tPA.  Neurology follow up.  Continued on Plavix for CVA prophylaxis.  Subcutaneous Lovenox for DVT prophylaxis.  TEE completed that showed no PFO without thrombosis and underwent loop recorder implantation on August 12, 2013, per Dr. Graciela Husbands.  Physical and occupational therapy is ongoing.  The patient was admitted for a comprehensive rehab program.  PAST MEDICAL HISTORY:  See discharge diagnoses.  SOCIAL HISTORY:  Lives alone with a home health aid and family follow up.  FUNCTIONAL HISTORY:  Prior to admission, independent with a cane. Functional status upon admission to rehab services was min  assist to ambulate with a straight cane, needs assist for overall transfer levels, min assist stair assistance, min to mod assist activities of daily living.  PHYSICAL EXAMINATION:  VITAL SIGNS:  Blood pressure 113/55, pulse 64, temperature 98.3, respirations 18. GENERAL:  This was an alert female, mildly anxious, stuttering speech. Reasonable insight and awareness of her deficits. LUNGS:  Clear to auscultation. CARDIAC:  Regular rate and rhythm. ABDOMEN:  Soft, nontender.  Good bowel sounds.  REHABILITATION HOSPITAL COURSE:  The patient was admitted to inpatient rehab services with therapies initiated on a 3-hour daily basis consisting of physical therapy, occupational therapy, speech therapy, and rehabilitation nursing.  The following issues were addressed during the patient's rehabilitation stay.  Pertaining Ms. Libbey functional deficits secondary to the left brain infarct, she remained on Plavix therapy.  A loop recorder had been implanted for ongoing etiology. Subcutaneous Lovenox for DVT prophylaxis.  The patient with persistent left arm pain, probable CRPS, this had been followed for some years in the past.  She had been seen at [facility].  Full records were not made available.  Venous Doppler studies left upper extremity, negative. She had been placed on Pamelor to help aid in pain management, as well as Lyrica.  Also with persistent right thigh pain.  Negative lower extremity Dopplers, felt to be possible meralgia-paresthetica versus stroke related paresthesia.  Oxycodone  for breakthrough pain.  She continued on Zoloft for history of depression, emotional support provided.  Diabetes mellitus, peripheral neuropathy.  Hemoglobin A1c 5.8, her home dose Glucophage had been resumed.  Blood pressures monitored on Cozaar.  No orthostatic changes.  She continued on Tapazole for hyperthyroidism, latest TSH level 3.40.  Urine culture, no growth.  However, microscopic urine  did show some Trichomonas.  She had been placed on Flagyl.  The patient received weekly collaborative interdisciplinary team conferences to discuss estimated length of stay, family teaching, and any barriers to her discharge.  She was ambulating greater than 150 feet in controlled home environment with a straight point cane at supervision level.  Sessions focused on gait training with her cane, tight spaces, controlled environment.  They even practice reaction time of a dog were to jump out in front of her.  No loss of balance noted.  Supervision for basic transfers.  She was able to negotiate stairs with supervision. Activities of daily living, ambulated to the toilet with a straight point cane with distant supervision.  Completed toileting and clothing management with modified independence.  Full family teaching was completed with ongoing recommendations of home health physical and occupational therapies.  She was maintained on a regular consistency diet.  DISCHARGE MEDICATIONS: 1. Plavix 75 mg p.o. daily. 2. Cymbalta 20 mg p.o. daily. 3. Hyzaar 100-25 mg daily. 4. Glucophage 500 mg p.o. daily. 5. Tapazole 5 mg p.o. daily. 6. Flagyl 500 mg p.o. every 12 hours x7 day course. 7. Pamelor 10 mg p.o. at bedtime. 8. Percocet 1 tablet every 8 hours as needed for severe pain, dispense     of 60 tablets. 9. Lyrica 100 mg p.o. t.i.d. 10.Zocor 40 mg p.o. daily. 11.The patient had been on subcutaneous Lovenox for DVT prophylaxis     throughout her hospital course, this was discontinued at time of     discharge.  DIET:  Regular consistency diabetic diet.  SPECIAL INSTRUCTIONS:  The patient would follow up with Dr. Claudette LawsAndrew Kirsteins at the outpatient rehab center on September 28, 2013; Dr. Sherryl MangesSteven Klein, Cardiology Services in 2 weeks; Dr. Roda ShuttersXu, Neurology Services, call for appointment in 1 month; Dr. Fleet ContrasEdwin Avbuere on September 04, 2013, medical management.  Home health physical and occupational  therapies have been arranged.     Mariam Dollaraniel Angiulli, P.A.   ______________________________ Erick ColaceAndrew E. Kirsteins, M.D.    DA/MEDQ  D:  08/21/2013  T:  08/21/2013  Job:  [id number]  cc:   Duke SalviaSteven C. Klein, MD, [facility] QueensFACC Fleet ContrasEdwin Avbuere, M.D.

## 2013-08-22 ENCOUNTER — Inpatient Hospital Stay (HOSPITAL_COMMUNITY): Payer: Medicare HMO | Admitting: Physical Therapy

## 2013-08-22 DIAGNOSIS — I6340 Cerebral infarction due to embolism of unspecified cerebral artery: Secondary | ICD-10-CM

## 2013-08-22 DIAGNOSIS — R269 Unspecified abnormalities of gait and mobility: Secondary | ICD-10-CM

## 2013-08-22 DIAGNOSIS — R471 Dysarthria and anarthria: Secondary | ICD-10-CM

## 2013-08-22 DIAGNOSIS — J45909 Unspecified asthma, uncomplicated: Secondary | ICD-10-CM

## 2013-08-22 LAB — GLUCOSE, CAPILLARY
Glucose-Capillary: 105 mg/dL — ABNORMAL HIGH (ref 70–99)
Glucose-Capillary: 104 mg/dL — ABNORMAL HIGH (ref 70–99)

## 2013-08-22 NOTE — Progress Notes (Signed)
Kara Mendoza is a 63 y.o. female Nov 14, 1950 [id number]  Subjective: No new complaints. No new problems. Slept well. Feeling OK.  Objective: Vital signs in last 24 hours: Temp:  [97.9 F (36.6 C)-98.4 F (36.9 C)] 97.9 F (36.6 C) (07/18 0527) Pulse Rate:  [69-85] 69 (07/18 0527) Resp:  [18] 18 (07/18 0527) BP: (109-119)/(71-74) 109/71 mmHg (07/18 0527) SpO2:  [98 %-100 %] 100 % (07/18 0527) Weight:  [169 lb 7 oz (76.856 kg)] 169 lb 7 oz (76.856 kg) (07/18 0116) Weight change:  Last BM Date: 08/20/13  Intake/Output from previous day: 07/17 0701 - 07/18 0700 In: 1080 [P.O.:1080] Out: -  Last cbgs: CBG (last 3)   Recent Labs  08/21/13 1627 08/21/13 2044 08/22/13 0715  GLUCAP 113* 124* 105*     Physical Exam General: No apparent distress   HEENT: not dry Lungs: Normal effort. Lungs clear to auscultation, no crackles or wheezes. Cardiovascular: Regular rate and rhythm, no edema Abdomen: S/NT/ND; BS(+) Musculoskeletal:  unchanged Neurological: No new neurological deficits Wounds: N/A    Skin: clear  Aging changes Mental state: Alert, oriented, cooperative    Lab Results: BMET    Component Value Date/Time   NA 139 08/17/2013 0548   K 4.2 08/17/2013 0548   CL 101 08/17/2013 0548   CO2 25 08/17/2013 0548   GLUCOSE 127* 08/17/2013 0548   BUN 13 08/17/2013 0548   CREATININE 0.75 08/17/2013 0548   CREATININE 0.81 03/10/2012 1612   CALCIUM 9.5 08/17/2013 0548   GFRNONAA 88* 08/17/2013 0548   GFRNONAA 78 03/10/2012 1612   GFRAA >90 08/17/2013 0548   GFRAA 89 03/10/2012 1612   CBC    Component Value Date/Time   WBC 6.6 08/17/2013 0548   RBC 4.40 08/17/2013 0548   HGB 12.8 08/17/2013 0548   HCT 39.5 08/17/2013 0548   PLT 294 08/17/2013 0548   MCV 89.8 08/17/2013 0548   MCH 29.1 08/17/2013 0548   MCHC 32.4 08/17/2013 0548   RDW 12.9 08/17/2013 0548   LYMPHSABS 2.5 08/17/2013 0548   MONOABS 0.5 08/17/2013 0548   EOSABS 0.2 08/17/2013 0548   BASOSABS 0.0 08/17/2013 0548     Studies/Results: No results found.  Medications: I have reviewed the patient's current medications.  Assessment/Plan:  . Functional deficits secondary to left brain infarcts with word finding deficits, weakness. Status post loop recorder to establish etiology of infarct  2. DVT Prophylaxis/Anticoagulation:Left upper extremity doppler negative. Subcutaneous Lovenox. Monitor platelet counts and any signs of bleeding  3. Pain Management, left arm pain- Prob CRPS  -pt was seen by neurology in November who felt LUE pain was "musculoskeletal"  -it is entirely unclear when this began---some reports indicate 2008 while others state it's more recently. The patient seems to indicate that it began more recently (POOR HISTORIAN HOWEVER)(also with right thigh pain)  -it appears to me to be consistent with CRPS.  -will titrate lyrica further up to 100mg  tid, low dose nortriptyline added.  -R thigh pain no swelling- Negative LE duplex, may be meralgia paresthetica vs CVA related parasthesia  - Oxycodone as needed. Monitor with increased mobility  4. Mood/depression/anxiety: Patient on Zoloft 50 mg each bedtime prior to admission  5. Neuropsych: This patient is capable of making decisions on her own behalf.  6. Diabetes mellitus with peripheral neuropathy. Hemoglobin A1c 5.8. Check blood sugars a.c. and at bedtime. Sliding scale insulin. Patient on Glucophage 500 mg daily prior to admission. Good control at present  7. Hypertension. Cozaar 50  mg daily. Monitor with increased mobility. Patient had been on Hyzaar 100-25 daily prior to admission.  8. Hyperthyroidism. Continue Tapazole. TSH level 3.400  9. Hyperlipidemia. Zocor  Pt is ready to go home     Length of stay, days: 8  Sonda PrimesAlex Jammi Morrissette , MD 08/22/2013, 8:18 AM

## 2013-08-23 ENCOUNTER — Inpatient Hospital Stay (HOSPITAL_COMMUNITY): Payer: Medicare HMO | Admitting: Physical Therapy

## 2013-08-24 NOTE — Progress Notes (Signed)
Speech Language Pathology Discharge Summary  Patient Details  Name: Kara Mendoza MRN: [id number] Date of Birth: 02/08/1950  Today's Date: 08/24/2013  Patient has met 4 of 4 long term goals.  Patient to discharge at overall Supervision level.  Reasons goals not met: n/a   Clinical Impression/Discharge Summary:  Pt made functional gains while inpatient and has discharged having met 4 of 4 long term goals.  Overall pt requires supervision for cognitive tasks due to impulsivity, decreased safety awareness and complex problem solving.  Additionally, pt presents with mild word finding impairments and dysfluencies during structured tasks; however, pt's fluency and functional word finding improve noticeably when pt is engaged in structured distraction activities.  Patient education is complete at this time and pt has discharged home with family who has agreed to provide 24/7 supervision.  Discharge recommendations include follow up speech therapy targeting cognitive function and family to provide assistance for medication and financial management.    Care Partner:  Caregiver Able to Provide Assistance: Yes  Type of Caregiver Assistance: Physical;Cognitive  Recommendation:  Home Health SLP;Outpatient SLP  Rationale for SLP Follow Up: Maximize cognitive function and independence;Reduce caregiver burden   Equipment: none recommended by SLP   Reasons for discharge: Discharged from hospital   Patient/Family Agrees with Progress Made and Goals Achieved: Yes   See FIM for current functional status  Nicole Page, M.A. CCC-SLP  Page, Nicole L 08/24/2013, 4:11 PM    

## 2013-08-26 NOTE — Consult Note (Signed)
NEUROCOGNITIVE STATUS EXAMINATION - [facility]   [name] is a 63 year old, right-handed, African American woman, who was seen for a brief neurocognitive status examination to evaluate her emotional state and mental status in the setting of stroke.  According to her medical record, [name] was admitted on 08/09/13 with increased aphasia (she had some premorbid aphasia from a prior stroke).   MRI of the brain demonstrated extension of acute infarcts in the left parietal operculum, left insula, and inferior parietal lobes.  She did not receive TPA.  Neuropsychological consult was requested due to questions about cognitive capabilities for independent living post-discharge and possible mood disruption.    Emotional Functioning:  During the clinical interview, [name] acknowledged the presence of depression and anxiety.  She stated that her mood disruption seemed to be primarily secondary to concerns about being able to pay rent and other financial issues to such an extent that she fears she may end up homeless.  She has purportedly been homeless briefly in the past, but has lived in her current apartment without trouble for the past 4 years.  [name] repeatedly expressed dissatisfaction in her support network, stating that she feels isolated.  Despite the aforementioned concerns, [name] denied suicidal ideation.  She stated that she uses spirituality and singing to cope with difficult times.  During today's session, [name] shared multiple traumas that she has endured throughout her lifetime and she said that spirituality and singing helped her to navigate through those times as well.  Some time was spent during the session allowing her the opportunity to sing in order to recognize the powerful positive effect it has on her.  She commented that although she did not feel that her voice was "good," she felt better after singing.  Notably, her aphasia improved  when she was singing.  [name]'s responses to self-report measures of mood symptoms were suggestive of the presence of mild-moderate depressed mood and mild anxiety at this time.    Mental Status:  [name]'s total score on an overall measure of mental status was suggestive of the presence of significant cognitive impairment, at the level of dementia (MoCA = 18/30).  She lost points for disruptions in visuoconstruction, working memory, executive functioning, language, and memory.  Of note, she was unable to freely recall any of 5 studied words after a brief delay.  Of importance, semantic cueing and recognition cueing did not provide significant benefit in aiding in memory.  Behaviorally, [name]'s speech was stuttered in an aphasic pattern at times, particularly when she was searching for a particular word.  These language difficulties adversely impacted certain performances on testing, but cannot fully account for her memory difficulty, given that recognition cues did not provide appreciable benefit.    Impressions and Recommendations:  [name]'s overall neurocognitive profile was suggestive of the presence of significant cognitive impairment, at the level of dementia.  Although certain deficits were likely related to her aphasic speech at times, many of her demonstrated areas of cognitive weakness cannot be accounted for by aphasia; namely memory problems.  Owing to her cognitive impairments, she is at risk for certain safety issues (e.g. medication mismanagement).  Therefore, it is recommended that someone assist her with medication management to ensure proper compliance.  At minimum, use of a pillbox and alarm should be encouraged so that she can keep track of what medications she needs to take and which she has already taken and to be  sure that she is taking them in a timely manner.  More extensive neuropsychological testing as an outpatient would be useful in further clarifying the extent of her  cognitive deficits and information on a neuropsychologist near her should be included in her discharge paperwork.  From an emotional standpoint, [name] seems to be experiencing depression and anxiety, to at least a mild degree.  She would likely benefit from participation in individual psychotherapy as an outpatient and she was open to this.  Information on providers in her area should be included with her discharge paperwork.    DIAGNOSES: CVA Dementia, NOS (likely Vascular dementia) Mixed Anxiety and Depressive Disorder  Leavy Cella, Psy.D.  Clinical Neuropsychologist

## 2013-08-28 ENCOUNTER — Ambulatory Visit (INDEPENDENT_AMBULATORY_CARE_PROVIDER_SITE_OTHER): Payer: Medicare HMO | Admitting: *Deleted

## 2013-08-28 ENCOUNTER — Telehealth: Payer: Self-pay

## 2013-08-28 DIAGNOSIS — I6350 Cerebral infarction due to unspecified occlusion or stenosis of unspecified cerebral artery: Secondary | ICD-10-CM

## 2013-08-28 LAB — MDC_IDC_ENUM_SESS_TYPE_INCLINIC

## 2013-08-28 NOTE — Telephone Encounter (Signed)
Donita @ [facility] is requesting a verbal order for a home health nutritionist for patient to help manage diabetes and cardio problems. Verbal given.

## 2013-08-28 NOTE — Progress Notes (Signed)
ILR wound check visit.  Steri strips removed and wound well healed.  1 symptoms recorded episode on the day of implant for instruction.  Follow up via [facility].

## 2013-09-01 ENCOUNTER — Telehealth: Payer: Self-pay

## 2013-09-01 NOTE — Telephone Encounter (Signed)
Donita @ [facility] is requesting a verbal order to a HHA to assist patient 2x a week.

## 2013-09-02 NOTE — Telephone Encounter (Signed)
Notifed Donita on her confidential voicemail that verbal approval given per protocol.

## 2013-09-03 ENCOUNTER — Telehealth: Payer: Self-pay

## 2013-09-03 DIAGNOSIS — I10 Essential (primary) hypertension: Secondary | ICD-10-CM

## 2013-09-03 DIAGNOSIS — F3289 Other specified depressive episodes: Secondary | ICD-10-CM

## 2013-09-03 DIAGNOSIS — G709 Myoneural disorder, unspecified: Secondary | ICD-10-CM

## 2013-09-03 DIAGNOSIS — I69920 Aphasia following unspecified cerebrovascular disease: Secondary | ICD-10-CM | POA: Diagnosis not present

## 2013-09-03 DIAGNOSIS — F329 Major depressive disorder, single episode, unspecified: Secondary | ICD-10-CM

## 2013-09-03 DIAGNOSIS — E1142 Type 2 diabetes mellitus with diabetic polyneuropathy: Secondary | ICD-10-CM

## 2013-09-03 DIAGNOSIS — I69959 Hemiplegia and hemiparesis following unspecified cerebrovascular disease affecting unspecified side: Secondary | ICD-10-CM | POA: Diagnosis not present

## 2013-09-03 DIAGNOSIS — E1149 Type 2 diabetes mellitus with other diabetic neurological complication: Secondary | ICD-10-CM | POA: Diagnosis not present

## 2013-09-03 NOTE — Telephone Encounter (Signed)
Allison(ST @ [facility]) is requesting a verbal order to continue speech therapy with patient. Attempted to contact Revonda StandardAllison. Left a message on a verified voicemail.

## 2013-09-08 ENCOUNTER — Telehealth: Payer: Self-pay

## 2013-09-08 NOTE — Telephone Encounter (Signed)
FYI::  Dennis(PT @ [facility]) called to inform us that patient had fallen yesterday and C/O numbness, weakness, dizziness, and loss of balance. Maurine MinisterDennis states patient questioned having another TIA. Attempted to contact Maurine Ministerennis. No answer or verified voicemail. Contacted patient and advised her to go to the ER to be evaluated.

## 2013-09-15 ENCOUNTER — Telehealth: Payer: Self-pay

## 2013-09-15 NOTE — Telephone Encounter (Signed)
K[facility] Mendoza[facility]) is requesting a verbal order to extend SLP. Attempted to contact Revonda StandardAllison. Left a message on a verified confidential voicemail giving a verbal okay to extend SLP.

## 2013-09-17 NOTE — Progress Notes (Signed)
Loop recorder 

## 2013-09-21 ENCOUNTER — Ambulatory Visit (INDEPENDENT_AMBULATORY_CARE_PROVIDER_SITE_OTHER): Payer: Commercial Managed Care - HMO | Admitting: *Deleted

## 2013-09-21 DIAGNOSIS — I6350 Cerebral infarction due to unspecified occlusion or stenosis of unspecified cerebral artery: Secondary | ICD-10-CM

## 2013-09-28 ENCOUNTER — Ambulatory Visit (HOSPITAL_BASED_OUTPATIENT_CLINIC_OR_DEPARTMENT_OTHER): Payer: Commercial Managed Care - HMO | Admitting: Physical Medicine & Rehabilitation

## 2013-09-28 ENCOUNTER — Encounter: Payer: Self-pay | Admitting: Physical Medicine & Rehabilitation

## 2013-09-28 ENCOUNTER — Encounter: Payer: Medicare HMO | Attending: Physical Medicine & Rehabilitation

## 2013-09-28 VITALS — BP 130/61 | HR 92 | Resp 14 | Ht 62.0 in | Wt 170.0 lb

## 2013-09-28 DIAGNOSIS — I6340 Cerebral infarction due to embolism of unspecified cerebral artery: Secondary | ICD-10-CM

## 2013-09-28 DIAGNOSIS — G5610 Other lesions of median nerve, unspecified upper limb: Secondary | ICD-10-CM | POA: Insufficient documentation

## 2013-09-28 DIAGNOSIS — G8110 Spastic hemiplegia affecting unspecified side: Secondary | ICD-10-CM | POA: Insufficient documentation

## 2013-09-28 DIAGNOSIS — E119 Type 2 diabetes mellitus without complications: Secondary | ICD-10-CM | POA: Insufficient documentation

## 2013-09-28 DIAGNOSIS — I69993 Ataxia following unspecified cerebrovascular disease: Secondary | ICD-10-CM

## 2013-09-28 DIAGNOSIS — E0590 Thyrotoxicosis, unspecified without thyrotoxic crisis or storm: Secondary | ICD-10-CM | POA: Insufficient documentation

## 2013-09-28 NOTE — Progress Notes (Signed)
Subjective:    Patient ID: Kara Mendoza, female    DOB: 12-30-1950, 63 y.o.   MRN: [id number]  HPI  Pain Inventory Average Pain 7 Pain Right Now 9 My pain is burning, stabbing and aching  In the last 24 hours, has pain interfered with the following? General activity 9 Relation with others 9 Enjoyment of life 9 What TIME of day is your pain at its worst? morning, evening Sleep (in general) Fair  Pain is worse with: bending Pain improves with: therapy/exercise and medication Relief from Meds: 10  Mobility walk with assistance use a cane how many minutes can you walk? 25 ability to climb steps?  yes do you drive?  yes use a wheelchair Do you have any goals in this area?  yes  Function disabled: date disabled 2008 I need assistance with the following:  dressing, bathing, toileting, meal prep, household duties and shopping Do you have any goals in this area?  yes  Neuro/Psych numbness tingling trouble walking confusion depression anxiety  Prior Studies Any changes since last visit?  no  Physicians involved in your care Any changes since last visit?  no   Family History  Problem Relation Age of Onset  . Diabetes Sister   . Cancer Father   . Diabetes Brother    History   Social History  . Marital Status: Single    Spouse Name: N/A    Number of Children: 3  . Years of Education: College   Occupational History  .  Other    Disabled   Social History Main Topics  . Smoking status: Current Every Day Smoker -- 0.20 packs/day for 15 years    Types: Cigarettes  . Smokeless tobacco: Never Used     Comment: Tryin to quit  . Alcohol Use: No  . Drug Use: No  . Sexual Activity: None   Other Topics Concern  . None   Social History Narrative   Patient lives at home with her son.   Caffeine Use: 1 cup every other day   Past Surgical History  Procedure Laterality Date  . Cesarean section      x3  . Finger surgery    . Loop recorder implant   08-12-2013    MDT LINQ implanted by Dr Graciela Husbands for cryptogenic stroke  . Tee without cardioversion N/A 08/12/2013    Procedure: TRANSESOPHAGEAL ECHOCARDIOGRAM (TEE);  Surgeon: Wendall Stade, MD;  [facility] END[facility]COPY;  Service: Cardiovascular;  Laterality: N/A;   Past Medical History  Diagnosis Date  . Neuromuscular disorder   . Hypertension   . Hyperlipidemia   . Asthma   . Diabetes mellitus   . Hyperthyroidism   . Esophageal reflux   . Constipation   . Major depressive disorder, recurrent episode, moderate   . Anxiety state, unspecified   . History of fall   . Abnormality of gait   . Chronic pain syndrome   . Tobacco use disorder   . Osteoarthritis   . Dysarthria   . Acute bronchitis   . Chronic arm pain 2008    left  . Chronic leg pain 2008    left  . Fatigue   . Chronic headache   . Stroke    BP 130/61  Pulse 92  Resp 14  Ht  (1.575 m)  Wt 170 lb (77.111 kg)  BMI 31.09 kg/m2  SpO2 97%  Opioid Risk Score:   Fall Risk Score: Moderate Fall Risk (6-13 points) (pt educated,  given handout)   Review of Systems  Constitutional: Positive for appetite change.  Cardiovascular: Positive for leg swelling.  Musculoskeletal: Positive for gait problem.  Skin: Positive for rash.  Neurological: Positive for numbness.       Tingling  Psychiatric/Behavioral: Positive for confusion. The patient is nervous/anxious.        Depression  All other systems reviewed and are negative.      Objective:   Physical Exam        Assessment & Plan:

## 2013-09-28 NOTE — Progress Notes (Signed)
   Subjective:    Patient ID: Kara Mendoza, female    DOB: [birth year], 63 y.o.   MRN: [id number]  HPI DATE OF ADMISSION:  08/12/2013 DATE OF DISCHARGE:  08/22/2013   This is a 63 year old right-handed female with history of left temporoparietal infarction with residual dysarthria, aphasia in 2008, maintained on Plavix for CVA prophylaxis who was independent with a cane prior to admission, living alone, had a home health aid 2 hours a day.  Admitted August 09, 2013, with increased aphasia.  MRI of the brain showed extension of acute infarcts left parietal operculum, left insula, and inferior parietal lobe.  MRA of the head without occlusion.  CTA of head and neck negative.  Echocardiogram with ejection fraction of 60%.  No wall motion abnormalities.  The patient did not receive tPA.  Neurology follow up.  Continued on Plavix for CVA prophylaxis.   Patient would like me to check her skin she states it is peeling all over Review of Systems Please see review on second note    Objective:   Physical Exam  4/5 strength in the right deltoid, bicep, tricep, grip, hip flexor, knee extensors, ankle dorsiflexor 5/5 in the left hip flexor knee extensor ankle dorsiflexor plantar flexor 3/5 limited by pain in the left deltoid, bicep, tricep, grip Hypersensitivity touch left upper extremity. Mild swelling in the left upper extremity  Skin shows mild dryness no evidence of rash, small amount of dried skin flaking on the lateral aspect of both index fingers.  Gait is using a cane. Wide base of support.  Cerebellar testing finger-nose-finger intact.  Good radial pulse bilateral No erythema    Assessment & Plan:  #1. Recurrent left CVA affecting parietal cortex. Resulting in right hemiparesis right paresthesias. This is superimposed on previous stroke of 2008 which was in the left temporal parietal area resulting in dysarthria and aphasia. 2. Chronic left upper extremity pain. She's had normal  vascular studies. [facility]. This looks like reflex sympathetic dystrophy. She has not tried stellate ganglion blocks however would have to, be off her Plavix. Patient states that she does not want to just do cortisone type and injections she wants it " fixed" Patient states that some doctor told her that surgery can be done however I do not see any notes to this effect. Patient says she has some type of blockage in her left arm but vascular studies normal on 2 occasions this year  Patient also had an EMG/NCV demonstrating carpal tunnel however this would not explain the more diffuse nature of her left upper ext pain.  Do not think carpal tunnel release would resolve her Left upper ext diffuse pain  Patient will be following up with neurology for her stroke.  Also recommend primary care followup  PM& R. followup when necessary  3. Possible embolic stroke following up with cardiology to interogate gait loop recorder.

## 2013-09-28 NOTE — Patient Instructions (Signed)
09/30/2013 9:30 AM [name], MD [facility] [phone]                                                                                                                                                      10/14/2013 3:30 PM Duke Salvia, MD [facility] Office- loop recorder evaluation

## 2013-09-30 ENCOUNTER — Ambulatory Visit (INDEPENDENT_AMBULATORY_CARE_PROVIDER_SITE_OTHER): Payer: Commercial Managed Care - HMO | Admitting: Neurology

## 2013-09-30 ENCOUNTER — Encounter: Payer: Self-pay | Admitting: Neurology

## 2013-09-30 VITALS — BP 123/79 | HR 89 | Ht 62.0 in | Wt 172.4 lb

## 2013-09-30 DIAGNOSIS — I639 Cerebral infarction, unspecified: Secondary | ICD-10-CM

## 2013-09-30 DIAGNOSIS — E785 Hyperlipidemia, unspecified: Secondary | ICD-10-CM

## 2013-09-30 DIAGNOSIS — I10 Essential (primary) hypertension: Secondary | ICD-10-CM

## 2013-09-30 DIAGNOSIS — I6350 Cerebral infarction due to unspecified occlusion or stenosis of unspecified cerebral artery: Secondary | ICD-10-CM

## 2013-09-30 DIAGNOSIS — G9050 Complex regional pain syndrome I, unspecified: Secondary | ICD-10-CM

## 2013-09-30 DIAGNOSIS — G90513 Complex regional pain syndrome I of upper limb, bilateral: Secondary | ICD-10-CM | POA: Insufficient documentation

## 2013-09-30 DIAGNOSIS — I6340 Cerebral infarction due to embolism of unspecified cerebral artery: Secondary | ICD-10-CM

## 2013-09-30 NOTE — Progress Notes (Signed)
STROKE NEUROLOGY FOLLOW UP NOTE  NAME: Kara Mendoza DOB: 10/20/1950  REASON FOR VISIT: stroke follow up HISTORY FROM: pt and chart  Today we had the pleasure of seeing Kara Mendoza in follow-up at our Neurology Clinic. Pt was accompanied by no one.   History Summary [name] is a 63 y.o. female with prior hx of left MCA distribution infarct in 2008 with residual language impairment, hypertension, dyslipidemia, tobacco abuse, admitted with stuttering speech and LUE pain and fluctuating weakness. Did not receive tPA due to unclear last known well. Imaging confirms chronic left parietal infarct with new extension into the left parietal operculum, left insula, and left inferior parietal lobe, surrounding the area of chronic infarction. Chronic microvascular ischemic change. 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 Stroke etiology felt to be embolic, though no source found (cryptogenic). Hypercoagulable work up so far unrevealing and TEE neg. Loop recorder inserted. She was discharged with plavix and zocor.  Interval History During the interval time, the patient has been doing better with speech although she will have stuttering if she is anxious. She still complains of LUE pain, painful to touch, stated that it sometimes swelling, cold, redness, which has been going for about one year. She will have outpt neurology referral by PCP to see Dr. Dionicia Abler at [facility] for this.   She has Loop recorder implanted and so far she did not receive any phone call from cardiology.   REVIEW OF SYSTEMS: Full 14 system review of systems performed and notable only for those listed below and in HPI above, all others are negative:  Constitutional: activity change  Cardiovascular: palpitation  Ear/Nose/Throat: facial swelling, hearing loss, ringing in the ears  Skin: rash  Eyes: blurry vision  Respiratory: N/A    Gastroitestinal: N/A  Genitourinary: N/A Hematology/Lymphatic: N/A  Endocrine: excessive eating  Musculoskeletal: N/A  Allergy/Immunology: N/A  Neurological: frequent wakening, memory loss, headache, numbness, speech difficulty, weakness  Psychiatric: confusion, depression, nervous/anxious  The following represents the patient's updated allergies and side effects list: Allergies  Allergen Reactions  . Aspirin Other (See Comments)    Possibly itching and "feeling little weird"  . 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  Steri strips removed and wound well healed.  1 symptoms recorded episode on the day of implant for instruction.  Follow up via [facility].    The neurologically relevant items on the patient's problem list were reviewed on today's visit.  Neurologic Examination  A problem focused neurological exam (12 or more points of the single system neurologic examination, vital signs counts as 1 point, cranial nerves count for 8 points) was performed.  Blood pressure 123/79, pulse 89, height  (1.575 m), weight 172 lb 6.4 oz (78.2 kg).  General - Well nourished, well developed, in no apparent distress.  Ophthalmologic - not able to see through.  Cardiovascular - Regular rate and rhythm with no murmur.  Mental Status -  Level of arousal and  orientation to time, place, and person were intact. Language including expression, naming, repetition, comprehension was assessed and found intact., but with stuttering if anxious  Cranial Nerves II - XII - II - Visual field intact OU. III, IV, VI - Extraocular movements intact. V -  Facial sensation intact bilaterally. VII - Facial movement intact bilaterally. VIII - Hearing & vestibular intact bilaterally. X - Palate elevates symmetrically. XI - Chin turning & shoulder shrug intact bilaterally. XII - Tongue protrusion intact.  Motor Strength - The patient's strength was normal in all extremities and pronator drift was absent.  Bulk was normal and fasciculations were absent.  Motor Tone - Muscle tone was assessed at the neck and appendages and was normal.  Reflexes - The patient's reflexes were 1+ in all extremities and she had no pathological reflexes.  Sensory - extremely sensitive and painful on touch at left arm from mid-arm down to hand.  Coordination - The patient had normal movements in the hands with no ataxia or dysmetria.  Tremor was absent.  Gait and Station - slow with cane and wide based.  Data reviewed: I personally reviewed the images and agree with the radiology interpretations.  Dg Chest 2 View  08/09/2013 IMPRESSION: No active cardiopulmonary disease.  Ct Head Wo Contrast  08/09/2013 IMPRESSION: Stable chronic left MCA distribution infarction. No acute intracranial abnormality.  Ct Angio Head and neck W/cm &/or Wo Cm  08/09/2013 IMPRESSION: Negative study. No evidence of atherosclerotic narrowing or dissection. No vessel occlusion. No aneurysm or vascular malformation.  MRI/MRA  08/10/2013 IMPRESSION: Chronic left parietal infarct. Chronic microvascular ischemic change. Extension of acute infarct into the left parietal operculum, left insula, and left inferior parietal lobe, surrounding the area of chronic infarction. Small irregular distal left vertebral artery. 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  Carotid Doppler CTA neck done  2D Echocardiogram 08/10/13 EF 55-60%, no cardiac source of emboli identified  LUE doppler There is no DVT or SVT noted in the left upper extremity.    Transesophageal Echocardiogram 08/12/13 Impression:  1) No source of embolus  2) Negative bubble study  3) Normal EF 60%  4) No significant valve disease  5) No LAA thrombus  6) No aortic debris  7) Normal RV  8) No effusion  EKG 08/09/13 Sinus rhythm  RAE, consider biatrial enlargement  Anteroseptal infarct, old    Component     Latest Ref Rng 08/11/2013  Anticardiolipin IgA     <22 APL U/mL 3 (L)  Anticardiolipin IgG     <23 GPL U/mL 2 (L)  Anticardiolipin IgM     <11 MPL U/mL 3 (L)  PTT Lupus Anticoagulant     28.0 - 43.0 secs 33.0  PTTLA Confirmation     <8.0 secs NOT APPL  PTTLA 4:1 Mix     28.0 - 43.0 secs NOT APPL  DRVVT     <42.9 secs 32.5  Drvvt confirmation     <1.15 Ratio NOT APPL  dRVVT Incubated 1:1 Mix     <42.9 secs NOT APPL  Lupus Anticoagulant     NOT DETECTED NOT DETECTED  Phosphatydalserine, IgG     <16 U/mL 4  Phosphatydalserine, IgM     <22 U/mL 7  Phosphatydalserine, IgA     <20 U/mL 5  Total Protein     6.0 - 8.3 g/dL 6.6  Albumin     3.5 - 5.2  g/dL 3.7  AST     0 - 37 U/L 14  ALT     0 - 35 U/L 15  Alkaline Phosphatase     39 - 117 U/L 59  Total Bilirubin     0.3 - 1.2 mg/dL 0.6  Bilirubin, Direct     0.0 - 0.3 mg/dL <4.0  Indirect Bilirubin     0.3 - 0.9 mg/dL NOT CALCULATED  c-ANCA Screen     NEGATIVE NEGATIVE  p-ANCA Screen     NEGATIVE NEGATIVE  Atypical p-ANCA Screen     NEGATIVE NEGATIVE  TSH     0.350 - 4.500 uIU/mL   Vitamin B-12     211 - 911 pg/mL 1016 (H)  Folate      >20.0  RPR     NON REAC NON REAC  HIV     NONREACTIVE NONREACTIVE  Sed Rate     0 - 22 mm/hr 3  CRP, High Sensitivity      1.3  C3 Complement     90 - 180 mg/dL 981  Complement C4, Body Fluid     10 - 40 mg/dL 28  ds DNA Ab      <1  Homocysteine     4.0 - 15.4 umol/L 6.5  SSA (Ro) (ENA) Antibody, IgG     <1.0 NEG AI <1.0 NEG  SSB (La) (ENA) Antibody, IgG     <1.0 NEG AI <1.0 NEG  Rheumatoid Factor     <=14 IU/mL <10    Assessment:  As you may recall, she is a 63 y.o. African American female with PMH of left MCA distribution infarct in 2008 with residual language impairment, hypertension, dyslipidemia, tobacco abuse, found to have again left parietal and frontal small embolic pattern strokes, appears like extension from previous 2008 stroke. Felt like embolic, work up so far negative and hypercoag labs were not remarkable. Consider cryptogenic stroke. Talked with pt regarding RESPECT ESUS trial and she refused to participate. Will continue plavix for now.  Her LUE pain and not able to touch, with intermittent swelling, hot, cold, redness, concerning for RSD or CRPS.  She will follow up with Dr. Dionicia Abler in [facility].   Plan:  - continue plavix and zocor for stroke prevention - will do TCD with emboli detection - follow up with PCP for stroke risk factor modification - follow up with Dr. Dionicia Abler for likely CPRS or RSD - RTC in 3 months  No orders of the defined types were placed in this encounter.    Patient Instructions  - continue the plavix and zocor for stroke prevention - discussed about REPECT ESUS trial, but you are interested in. - follow up with your PCP for stroke risk factor modification - please go to see Dr. Dionicia Abler in 10/2013 for your appointment regarding left arm pain.  - I have attached some information for you to read. - follow up in 3 months.   [name], MD PhD [facility] [address] [place], [place] [address] [phone]

## 2013-09-30 NOTE — Patient Instructions (Addendum)
-   continue the plavix and zocor for stroke prevention - discussed about REPECT ESUS trial, but you are interested in. - follow up with your PCP for stroke risk factor modification - please go to see Dr. Dionicia Abler in 10/2013 for your appointment regarding left arm pain.  - I have attached some information for you to read. - follow up in 3 months.   Complex Regional Pain Syndrome Complex Regional Pain Syndrome (CRPS) is a nerve disorder that occurs at the site of an injury. It occurs especially after injuries from high-velocity impacts such as those from bullets or shrapnel. However, it may occur without apparent injury. The arms or legs are usually involved. SYMPTOMS  CRPS is a chronic condition characterized by:  Severe burning pain.  Changes in bone and skin.  Excessive sweating.  Tissue swelling.  Extreme sensitivity to touch. One visible sign of CRPS near the site of injury is warm, shiny, red skin that later becomes cool and bluish. The pain that patients report is out of proportion to the severity of the injury. The pain gets worse, rather than better, over time. Eventually the joints become stiff from disuse and the skin, muscles, and bone atrophy. The symptoms of CRPS vary in severity and duration. The cause of CRPS is unknown. The disorder is unique in that it simultaneously affects the:  Nerves.  Skin.  Muscles.  Blood vessels.  Bones. CRPS can strike at any age but is more common between the ages of 45 and 38. However, the number of CRPS cases among adolescents and young adults is increasing. CRPS is diagnosed primarily through observation of the symptoms. Some physicians use thermography to detect changes in body temperature that are common in CRPS. X-rays may also show changes in the bone.  TREATMENT  Treatments include:  Physicians use a variety of drugs to treat CRPS.  Elevation of the extremity and physical therapy are also used to treat CRPS.  Injection of a local  anesthetic.  Applying brief pulses of electricity to nerve endings under the skin (transcutaneous electrical stimulation or TENS).  In some cases, interruption of the affected portion of the sympathetic nervous system (surgical or chemical sympathectomy) is necessary to relieve pain. This involves cutting the nerve or nerves. Pain is destroyed almost instantly, but this treatment may also destroy other sensations. PROGNOSIS Good progress can be made in treating CRPS if treatment is begun early. Ideally treatment should begin within 3 months of the first symptoms. Early treatment often results in remission. If treatment is delayed, the disorder can quickly spread to the entire limb, and changes in bone and muscle may become irreversible. In 50 percent of CRPS cases, pain persists longer than 6 months and sometimes for years. Document Released: 01/12/2002 Document Revised: 04/16/2011 Document Reviewed: 12/03/2007 [facility] Patient Information 2015 [place], [place]. This information is not intended to replace advice given to you by your health care provider. Make sure you discuss any questions you have with your health care provider.

## 2013-10-02 ENCOUNTER — Encounter: Payer: Self-pay | Admitting: Internal Medicine

## 2013-10-05 ENCOUNTER — Encounter: Payer: Self-pay | Admitting: Internal Medicine

## 2013-10-07 ENCOUNTER — Telehealth: Payer: Self-pay | Admitting: Internal Medicine

## 2013-10-07 NOTE — Telephone Encounter (Signed)
New message     Pt is complaining of headache and chronic lft arm pain only---pt want pain medication.  I told the advanced home care nurse Dr Graciela Husbands will not call in pain medication---was this "heart related"?  She said no and she will contact patients PCP. Sent to nurse as a FYI.

## 2013-10-13 ENCOUNTER — Telehealth: Payer: Self-pay | Admitting: *Deleted

## 2013-10-13 ENCOUNTER — Encounter: Payer: Self-pay | Admitting: Internal Medicine

## 2013-10-13 NOTE — Telephone Encounter (Signed)
Calling to request additional visit from SW to assist pt with needs.  Verbal approval given.

## 2013-10-14 ENCOUNTER — Encounter: Payer: Self-pay | Admitting: *Deleted

## 2013-10-14 ENCOUNTER — Encounter: Payer: Self-pay | Admitting: Internal Medicine

## 2013-10-14 ENCOUNTER — Ambulatory Visit (INDEPENDENT_AMBULATORY_CARE_PROVIDER_SITE_OTHER): Payer: Commercial Managed Care - HMO | Admitting: Internal Medicine

## 2013-10-14 VITALS — BP 122/70 | HR 82 | Ht 62.0 in | Wt 173.6 lb

## 2013-10-14 DIAGNOSIS — I639 Cerebral infarction, unspecified: Secondary | ICD-10-CM

## 2013-10-14 DIAGNOSIS — Z959 Presence of cardiac and vascular implant and graft, unspecified: Secondary | ICD-10-CM | POA: Insufficient documentation

## 2013-10-14 DIAGNOSIS — I6350 Cerebral infarction due to unspecified occlusion or stenosis of unspecified cerebral artery: Secondary | ICD-10-CM

## 2013-10-14 HISTORY — DX: Presence of cardiac and vascular implant and graft, unspecified: Z95.9

## 2013-10-14 LAB — MDC_IDC_ENUM_SESS_TYPE_INCLINIC

## 2013-10-14 NOTE — Progress Notes (Signed)
Patient Care Team: Dorrene German, MD as PCP - General (Internal Medicine)   HPI  Kara Mendoza is a 63 y.o. female Seen in followup for cryptogenic stroke for which she had implanted 7/15 implantable loop recorder  Echocardiogram at that time demonstrated an ejection fraction 55-60%  She is complaining of left arm pain that dates back befor her stroke.  Past Medical History  Diagnosis Date  . Neuromuscular disorder   . Hypertension   . Hyperlipidemia   . Asthma   . Diabetes mellitus   . Hyperthyroidism   . Esophageal reflux   . Constipation   . Major depressive disorder, recurrent episode, moderate   . Anxiety state, unspecified   . History of fall   . Abnormality of gait   . Chronic pain syndrome   . Tobacco use disorder   . Osteoarthritis   . Dysarthria   . Acute bronchitis   . Chronic arm pain 2008    left  . Chronic leg pain 2008    left  . Fatigue   . Chronic headache   . Stroke     Past Surgical History  Procedure Laterality Date  . Cesarean section      x3  . Finger surgery    . Loop recorder implant  08-12-2013    MDT LINQ implanted by Dr Graciela Husbands for cryptogenic stroke  . Tee without cardioversion N/A 08/12/2013    Procedure: TRANSESOPHAGEAL ECHOCARDIOGRAM (TEE);  Surgeon: Wendall Stade, MD;  Location: [facility] ENDOSCOPY;  Service: Cardiovascular;  Laterality: N/A;    Current Outpatient Prescriptions  Medication Sig Dispense Refill  . albuterol (PROVENTIL HFA;VENTOLIN HFA) 108 (90 BASE) MCG/ACT inhaler Inhale 3 puffs into the lungs every 6 (six) hours as needed for wheezing or shortness of breath.       . clopidogrel (PLAVIX) 75 MG tablet Take 1 tablet (75 mg total) by mouth daily.  30 tablet  1  . DULoxetine (CYMBALTA) 20 MG capsule Take 1 capsule (20 mg total) by mouth daily.  30 capsule  3  . losartan-hydrochlorothiazide (HYZAAR) 100-25 MG per tablet Take 1 tablet by mouth daily.  30 tablet  1  . metFORMIN (GLUCOPHAGE) 500 MG tablet Take 1 tablet  (500 mg total) by mouth daily.  30 tablet  1  . methimazole (TAPAZOLE) 5 MG tablet Take 1 tablet (5 mg total) by mouth daily.  30 tablet  1  . Multiple Vitamins-Calcium (ONE-A-DAY WOMENS FORMULA PO) Take 1 tablet by mouth daily.      . nortriptyline (PAMELOR) 10 MG capsule Take 1 capsule (10 mg total) by mouth at bedtime.  30 capsule  1  . omeprazole (PRILOSEC) 20 MG capsule Take 1 capsule (20 mg total) by mouth 2 (two) times daily.  60 capsule  1  . oxyCODONE-acetaminophen (PERCOCET) 10-325 MG per tablet Take 1 tablet by mouth every 8 (eight) hours as needed for pain.      . pregabalin (LYRICA) 100 MG capsule Take 1 capsule (100 mg total) by mouth 3 (three) times daily.  90 capsule  1  . sertraline (ZOLOFT) 50 MG tablet Take 1 tablet (50 mg total) by mouth at bedtime.  30 tablet  1  . simvastatin (ZOCOR) 40 MG tablet Take 1 tablet (40 mg total) by mouth at bedtime.  30 tablet  1   No current facility-administered medications for this visit.    Allergies  Allergen Reactions  . Aspirin Other (See Comments)  Possibly itching and "feeling little weird"  . Penicillins Hives    Review of Systems negative except from HPI and PMH  Physical Exam BP 122/70  Pulse 82  Ht  (1.575 m)  Wt 173 lb 9.6 oz (78.744 kg)  BMI 31.74 kg/m2 Well developed and well nourished in no acute distress HENT normal E scleral and icterus clearU. Neck Supple JVP flat; carotids brisk and full Clear to ausculation Device pocket well healed; without hematoma or erythema.  There is no tethering and is Regular rate and rhythm, no murmurs gallops or rub Soft with active bowel sounds No clubbing cyanosis  LU Edema with severe pain in LU Alert and oriented,  LHP Skin Warm and Dry  ECG  NSR 15.09.37   Assessment and  Plan  Cryptogenic Stroke  Reflex sympathetic dystrophy  Hemiparesis  Social  Stress  Diabetes  LINQ Loop recorder  There are no active cardiac issues. Recorder interrogation  demonstrated no atrial fibrillation. After having felt like a genius by making a presumptive diagnosis of RSD, I looked at the problem list and see that the neurologist and at the same diagnosis in August.  I suggested that she follow up with neurology about this diagnosis.

## 2013-10-14 NOTE — Patient Instructions (Signed)
Your physician recommends that you continue on your current medications as directed. Please refer to the Current Medication list given to you today.  Your physician wants you to follow-up in: 1 year with Dr. Klein.  You will receive a reminder letter in the mail two months in advance. If you don't receive a letter, please call our office to schedule the follow-up appointment.  

## 2013-10-15 ENCOUNTER — Ambulatory Visit (INDEPENDENT_AMBULATORY_CARE_PROVIDER_SITE_OTHER): Payer: Commercial Managed Care - HMO

## 2013-10-15 DIAGNOSIS — I6350 Cerebral infarction due to unspecified occlusion or stenosis of unspecified cerebral artery: Secondary | ICD-10-CM

## 2013-10-15 DIAGNOSIS — I639 Cerebral infarction, unspecified: Secondary | ICD-10-CM

## 2013-10-16 NOTE — Progress Notes (Signed)
Loop recorder 

## 2013-10-20 LAB — MDC_IDC_ENUM_SESS_TYPE_REMOTE
Date Time Interrogation Session: 20150811161125
Zone Setting Detection Interval: 2000 ms
Zone Setting Detection Interval: 3000 ms
Zone Setting Detection Interval: 360 ms

## 2013-10-22 DIAGNOSIS — I82722 Chronic embolism and thrombosis of deep veins of left upper extremity: Secondary | ICD-10-CM | POA: Insufficient documentation

## 2013-10-22 DIAGNOSIS — G5601 Carpal tunnel syndrome, right upper limb: Secondary | ICD-10-CM | POA: Insufficient documentation

## 2013-10-22 DIAGNOSIS — M79602 Pain in left arm: Secondary | ICD-10-CM | POA: Insufficient documentation

## 2013-10-27 ENCOUNTER — Telehealth: Payer: Self-pay | Admitting: Neurology

## 2013-10-27 NOTE — Telephone Encounter (Signed)
Patient calling to state that when she was here on 09/30/13 she forgot to give [place] the paper to fax to [facility] transportation and is requesting that we write a note saying that she did show up to her appointment and fax it to [employer] transportation at [phone]. Please call and advise.

## 2013-10-28 ENCOUNTER — Encounter: Payer: Self-pay | Admitting: *Deleted

## 2013-10-28 NOTE — Telephone Encounter (Signed)
Letter was faxed on 10-28-13 patient notified.

## 2013-11-05 DIAGNOSIS — G5640 Causalgia of unspecified upper limb: Secondary | ICD-10-CM | POA: Insufficient documentation

## 2013-11-10 ENCOUNTER — Ambulatory Visit (INDEPENDENT_AMBULATORY_CARE_PROVIDER_SITE_OTHER): Payer: Commercial Managed Care - HMO | Admitting: *Deleted

## 2013-11-10 DIAGNOSIS — I639 Cerebral infarction, unspecified: Secondary | ICD-10-CM

## 2013-11-11 LAB — MDC_IDC_ENUM_SESS_TYPE_REMOTE
Date Time Interrogation Session: 20151007033226
Zone Setting Detection Interval: 2000 ms
Zone Setting Detection Interval: 3000 ms
Zone Setting Detection Interval: 360 ms

## 2013-11-13 NOTE — Progress Notes (Signed)
Loop recorder 

## 2013-11-20 ENCOUNTER — Encounter: Payer: Self-pay | Admitting: Internal Medicine

## 2013-12-09 ENCOUNTER — Ambulatory Visit (INDEPENDENT_AMBULATORY_CARE_PROVIDER_SITE_OTHER): Payer: Commercial Managed Care - HMO | Admitting: *Deleted

## 2013-12-09 DIAGNOSIS — I639 Cerebral infarction, unspecified: Secondary | ICD-10-CM

## 2013-12-15 ENCOUNTER — Encounter: Payer: Self-pay | Admitting: Internal Medicine

## 2013-12-15 LAB — MDC_IDC_ENUM_SESS_TYPE_REMOTE
Date Time Interrogation Session: 20151110014749
Zone Setting Detection Interval: 2000 ms
Zone Setting Detection Interval: 3000 ms
Zone Setting Detection Interval: 360 ms

## 2013-12-15 NOTE — Progress Notes (Signed)
Loop recorder 

## 2014-01-06 ENCOUNTER — Ambulatory Visit (INDEPENDENT_AMBULATORY_CARE_PROVIDER_SITE_OTHER): Payer: Commercial Managed Care - HMO | Admitting: Neurology

## 2014-01-06 ENCOUNTER — Encounter: Payer: Self-pay | Admitting: Neurology

## 2014-01-06 ENCOUNTER — Encounter: Payer: Self-pay | Admitting: *Deleted

## 2014-01-06 VITALS — BP 123/80 | HR 88 | Ht 62.0 in | Wt 172.4 lb

## 2014-01-06 DIAGNOSIS — I6340 Cerebral infarction due to embolism of unspecified cerebral artery: Secondary | ICD-10-CM

## 2014-01-06 DIAGNOSIS — I10 Essential (primary) hypertension: Secondary | ICD-10-CM

## 2014-01-06 DIAGNOSIS — G9050 Complex regional pain syndrome I, unspecified: Secondary | ICD-10-CM

## 2014-01-06 DIAGNOSIS — E785 Hyperlipidemia, unspecified: Secondary | ICD-10-CM

## 2014-01-06 DIAGNOSIS — Z720 Tobacco use: Secondary | ICD-10-CM

## 2014-01-06 NOTE — Progress Notes (Signed)
STROKE NEUROLOGY FOLLOW UP NOTE  NAME: Kara Mendoza DOB: [birth year]  REASON FOR VISIT: stroke follow up HISTORY FROM: pt and chart  Today we had the pleasure of seeing Kara Mendoza in follow-up at our Neurology Clinic. Pt was accompanied by no one.   History Summary [name] is a 63 y.o. female with prior hx of left MCA distribution infarct in 2008 with residual language impairment, hypertension, dyslipidemia, tobacco abuse, admitted with stuttering speech and LUE pain and fluctuating weakness. Did not receive tPA due to unclear last known well. Imaging confirms chronic left parietal infarct with new extension into the left parietal operculum, left insula, and left inferior parietal lobe, surrounding the area of chronic infarction. Chronic microvascular ischemic change. 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 Stroke etiology felt to be embolic, though no source found (cryptogenic). Hypercoagulable work up so far unrevealing and TEE neg. Loop recorder inserted. She was discharged with plavix and zocor.  Follow up 09/29/13 - the patient has been doing better with speech although she will have stuttering if she is anxious. She still complains of LUE pain, painful to touch, stated that it sometimes swelling, cold, redness, which has been going for about one year. She will have outpt neurology referral by PCP to see Dr. Dionicia Abler at [facility] for this. She has Loop recorder implanted and so far she did not receive any phone call from cardiology.   Interval History During the interval time, she was doing a little bit better. TCD emboli detection negative. She had follow up with Dr. Dionicia Abler for left UE RDS and was put on gabapentin and discontinued lyrica. She felt pain is better controlled, however, she stated that combination of percocet and gabapentin is more effective but she has no percocet left. She  needs to see Dr. Myna Hidalgo for more percocet prescription. She also followed with cardiology and loop recorder so far no AF episodes. She also follows with Dr. Chestine Spore for thyroid issue for which she is taking methimazole. Otherwise, she has no complain of neurological deficit, she still has mild stuttering which is chronic. Pt continued smoking, although cutting down from 2PPD to 3 cig per day.  REVIEW OF SYSTEMS: Full 14 system review of systems performed and notable only for those listed below and in HPI above, all others are negative:  Constitutional: N/A  Cardiovascular: palpitation  Ear/Nose/Throat: hearing loss  Skin: rash  Eyes: blurry vision  Respiratory: N/A  Gastroitestinal: N/A  Genitourinary: N/A Hematology/Lymphatic: N/A  Endocrine: N/A  Musculoskeletal: joint pain, aching muscles  Allergy/Immunology: N/A  Neurological: dizziness, headache, speech difficulty, weakness  Psychiatric: insomnia  The following represents the patient's updated allergies and side effects list: Allergies  Allergen Reactions  . Aspirin Other (See Comments)    Possibly itching and "feeling little weird"  . Penicillins Hives    Labs since last visit of relevance include the following: Results for orders placed or performed in visit on 12/09/13  Implantable device - remote  Result Value Ref Range   Date Time Interrogation Session [id number]    Pulse Generator Manufacturer [employer]    Pulse Gen Model [id number] Reveal LINQ    Pulse Gen Serial Number [id number]    Zone Setting Type Category VF    Zone Setting Type Category VT    Zone Setting Type Category VENTRICULAR_TACHYCARDIA_1    Zone Detect Interval 360 ms   Zone Setting  Type Category VENTRICULAR_TACHYCARDIA_2    Zone Setting Type Category ATRIAL_FIBRILLATION    Zone Setting Type Category ATAF    Zone Setting Type Category ASYSTOLE    Zone Detect Interval 3000 ms   Zone Setting Type Category BRADYCARDIA    Zone Detect Interval 2000 ms    Battery Status OK    Eval Rhythm SR    Miscellaneous Comment      Carelink summary report received. Battery status OK. Normal device function. 1 symptom episode--printed out for review. No tachy episodes, brady or pause episodes. No AF episodes. Monthly summary reports and ROV in September with SK.    The neurologically relevant items on the patient's problem list were reviewed on today's visit.  Neurologic Examination  A problem focused neurological exam (12 or more points of the single system neurologic examination, vital signs counts as 1 point, cranial nerves count for 8 points) was performed.  Blood pressure 123/80, pulse 88, height 5\' 2"  (1.575 m), weight 172 lb 6.4 oz (78.2 kg).  General - Well nourished, well developed, in no apparent distress.  Ophthalmologic - not able to see through.  Cardiovascular - Regular rate and rhythm with no murmur.  Mental Status -  Level of arousal and orientation to time, place, and person were intact. Language including expression, naming, repetition, comprehension was assessed and found intact., but with stuttering if anxious  Cranial Nerves II - XII - II - Visual field intact OU. III, IV, VI - Extraocular movements intact. V - Facial sensation intact bilaterally. VII - Facial movement intact bilaterally. VIII - Hearing & vestibular intact bilaterally. X - Palate elevates symmetrically, mild stuttering. XI - Chin turning & shoulder shrug intact bilaterally. XII - Tongue protrusion intact.  Motor Strength - The patient's strength was normal in all extremities and pronator drift was absent.  Bulk was normal and fasciculations were absent.  Motor Tone - Muscle tone was assessed at the neck and appendages and was normal.  Reflexes - The patient's reflexes were 1+ in all extremities and she had no pathological reflexes.  Sensory - sensitive and painful on touch at left arm from mid-arm down to hand, much improved from last  visit.  Coordination - The patient had normal movements in the hands with no ataxia or dysmetria.  Tremor was absent.  Gait and Station - walk without cane, mildly slow, wide based, but steady and improved from last visit.  Data reviewed: I personally reviewed the images and agree with the radiology interpretations.  Dg Chest 2 View  08/09/2013 IMPRESSION: No active cardiopulmonary disease.  Ct Head Wo Contrast  08/09/2013 IMPRESSION: Stable chronic left MCA distribution infarction. No acute intracranial abnormality.  Ct Angio Head and neck W/cm &/or Wo Cm  08/09/2013 IMPRESSION: Negative study. No evidence of atherosclerotic narrowing or dissection. No vessel occlusion. No aneurysm or vascular malformation.  MRI/MRA  08/10/2013 IMPRESSION: Chronic left parietal infarct. Chronic microvascular ischemic change. Extension of acute infarct into the left parietal operculum, left insula, and left inferior parietal lobe, surrounding the area of chronic infarction. Small irregular distal left vertebral artery. 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  Carotid Doppler CTA neck done  2D Echocardiogram 08/10/13 EF 55-60%, no cardiac source of emboli identified  LUE doppler There is no DVT or SVT noted in the left upper extremity.  Transesophageal Echocardiogram 08/12/13 Impression:  1) No source of embolus  2) Negative bubble  study  3) Normal EF 60%  4) No significant valve disease  5) No LAA thrombus  6) No aortic debris  7) Normal RV  8) No effusion  EKG 08/09/13 Sinus rhythm  RAE, consider biatrial enlargement  Anteroseptal infarct, old    Component     Latest Ref Rng 08/11/2013  Anticardiolipin IgA     <22 APL U/mL 3 (L)  Anticardiolipin IgG     <23 GPL U/mL 2 (L)  Anticardiolipin IgM     <11 MPL U/mL 3 (L)  PTT Lupus Anticoagulant     28.0 - 43.0 secs 33.0  PTTLA Confirmation     <8.0 secs NOT APPL  PTTLA 4:1 Mix      28.0 - 43.0 secs NOT APPL  DRVVT     <42.9 secs 32.5  Drvvt confirmation     <1.15 Ratio NOT APPL  dRVVT Incubated 1:1 Mix     <42.9 secs NOT APPL  Lupus Anticoagulant     NOT DETECTED NOT DETECTED  Phosphatydalserine, IgG     <16 U/mL 4  Phosphatydalserine, IgM     <22 U/mL 7  Phosphatydalserine, IgA     <20 U/mL 5  Total Protein     6.0 - 8.3 g/dL 6.6  Albumin     3.5 - 5.2 g/dL 3.7  AST     0 - 37 U/L 14  ALT     0 - 35 U/L 15  Alkaline Phosphatase     39 - 117 U/L 59  Total Bilirubin     0.3 - 1.2 mg/dL 0.6  Bilirubin, Direct     0.0 - 0.3 mg/dL <1.6  Indirect Bilirubin     0.3 - 0.9 mg/dL NOT CALCULATED  c-ANCA Screen     NEGATIVE NEGATIVE  p-ANCA Screen     NEGATIVE NEGATIVE  Atypical p-ANCA Screen     NEGATIVE NEGATIVE  TSH     0.350 - 4.500 uIU/mL   Vitamin B-12     211 - 911 pg/mL 1016 (H)  Folate      >20.0  RPR     NON REAC NON REAC  HIV     NONREACTIVE NONREACTIVE  Sed Rate     0 - 22 mm/hr 3  CRP, High Sensitivity      1.3  C3 Complement     90 - 180 mg/dL 109  Complement C4, Body Fluid     10 - 40 mg/dL 28  ds DNA Ab      <1  Homocysteine     4.0 - 15.4 umol/L 6.5  SSA (Ro) (ENA) Antibody, IgG     <1.0 NEG AI <1.0 NEG  SSB (La) (ENA) Antibody, IgG     <1.0 NEG AI <1.0 NEG  Rheumatoid Factor     <=14 IU/mL <10    Assessment: As you may recall, she is a 63 y.o. African American female with PMH of left MCA distribution infarct in 2008 with residual language impairment, hypertension, dyslipidemia, tobacco abuse, found to have again left parietal and frontal small embolic pattern strokes, appears like extension from previous 2008 stroke. Felt like embolic, work up so far negative and hypercoag labs were not remarkable. Consider cryptogenic stroke. Loop recorder so far negative for AF episodes. TCD emboli monitoring negative for MES. Talked with pt regarding RESPECT ESUS trial and she refused to participate. Continue plavix for now.  She is  to continue to follow up with Dr. Dionicia Abler in [facility] for  RDS, Dr. Chestine Sporelark for thyroid, Dr. Myna HidalgoAjam for pain control and her PCP for stroke risk factor modification as well as cardiology for cardiac monitoring.    Plan:  - continue plavix and zocor for stroke prevention - check BP and glucose at home - follow up with PCP for stroke risk factor modification - follow up with Dr. Dionicia AblerGuzik for RSD and Dr. Chestine Sporelark for thyroid and Dr. Myna HidalgoAjam for pain control as well as cardiology for cardiac monitoring - compliant with medication - RTC in 6 months  No orders of the defined types were placed in this encounter.    Patient Instructions  - continue plavix and statin for stroke prevention - check BP and glucose at home - Follow up with your primary care physician for stroke risk factor modification. Recommend maintain blood pressure goal <130/80, diabetes with hemoglobin A1c goal below 6.5% and lipids with LDL cholesterol goal below 70 mg/dL.  - continue to follow up with Dr. Chestine Sporelark, Dr. Myna HidalgoAjam, Dr. Johney FrameAllred as scheduled. - compliant with medication - quit smoking - RTC in 6 months.    Marvel PlanJindong Tiernan Millikin, MD PhD [facility] [address], [address] [place], [place] [address] [phone]

## 2014-01-06 NOTE — Patient Instructions (Signed)
-   continue plavix and statin for stroke prevention - check BP and glucose at home - Follow up with your primary care physician for stroke risk factor modification. Recommend maintain blood pressure goal <130/80, diabetes with hemoglobin A1c goal below 6.5% and lipids with LDL cholesterol goal below 70 mg/dL.  - continue to follow up with Dr. Chestine Sporelark, Dr. Myna HidalgoAjam, Dr. Johney FrameAllred as scheduled. - compliant with medication - quit smoking - RTC in 6 months.

## 2014-01-08 ENCOUNTER — Encounter: Payer: Self-pay | Admitting: Internal Medicine

## 2014-01-08 ENCOUNTER — Ambulatory Visit (INDEPENDENT_AMBULATORY_CARE_PROVIDER_SITE_OTHER): Payer: Commercial Managed Care - HMO | Admitting: *Deleted

## 2014-01-08 DIAGNOSIS — I639 Cerebral infarction, unspecified: Secondary | ICD-10-CM

## 2014-01-13 NOTE — Progress Notes (Signed)
Loop recorder 

## 2014-01-14 ENCOUNTER — Encounter (HOSPITAL_COMMUNITY): Payer: Self-pay | Admitting: Internal Medicine

## 2014-02-02 LAB — MDC_IDC_ENUM_SESS_TYPE_REMOTE
Date Time Interrogation Session: 20151205034135
Zone Setting Detection Interval: 2000 ms
Zone Setting Detection Interval: 3000 ms
Zone Setting Detection Interval: 360 ms

## 2014-02-08 ENCOUNTER — Ambulatory Visit (INDEPENDENT_AMBULATORY_CARE_PROVIDER_SITE_OTHER): Payer: Commercial Managed Care - HMO | Admitting: *Deleted

## 2014-02-08 ENCOUNTER — Encounter: Payer: Self-pay | Admitting: Internal Medicine

## 2014-02-08 DIAGNOSIS — I639 Cerebral infarction, unspecified: Secondary | ICD-10-CM

## 2014-02-08 LAB — MDC_IDC_ENUM_SESS_TYPE_REMOTE
Date Time Interrogation Session: 20160104065850
Zone Setting Detection Interval: 2000 ms
Zone Setting Detection Interval: 3000 ms
Zone Setting Detection Interval: 360 ms

## 2014-02-12 NOTE — Progress Notes (Signed)
Loop recorder 

## 2014-03-04 ENCOUNTER — Encounter: Payer: Self-pay | Admitting: Internal Medicine

## 2014-03-10 ENCOUNTER — Ambulatory Visit (INDEPENDENT_AMBULATORY_CARE_PROVIDER_SITE_OTHER): Payer: Commercial Managed Care - HMO | Admitting: *Deleted

## 2014-03-10 DIAGNOSIS — I639 Cerebral infarction, unspecified: Secondary | ICD-10-CM

## 2014-03-10 LAB — MDC_IDC_ENUM_SESS_TYPE_REMOTE
Date Time Interrogation Session: 20160127094602
Zone Setting Detection Interval: 2000 ms
Zone Setting Detection Interval: 3000 ms
Zone Setting Detection Interval: 360 ms

## 2014-03-12 NOTE — Progress Notes (Signed)
Loop recorder 

## 2014-03-16 ENCOUNTER — Encounter: Payer: Self-pay | Admitting: Internal Medicine

## 2014-04-09 ENCOUNTER — Ambulatory Visit (INDEPENDENT_AMBULATORY_CARE_PROVIDER_SITE_OTHER): Payer: Medicare HMO | Admitting: *Deleted

## 2014-04-09 DIAGNOSIS — I639 Cerebral infarction, unspecified: Secondary | ICD-10-CM

## 2014-04-09 LAB — MDC_IDC_ENUM_SESS_TYPE_REMOTE
Date Time Interrogation Session: 20160225075139
Zone Setting Detection Interval: 2000 ms
Zone Setting Detection Interval: 3000 ms
Zone Setting Detection Interval: 360 ms

## 2014-04-13 NOTE — Progress Notes (Signed)
Loop recorder 

## 2014-04-20 ENCOUNTER — Encounter: Payer: Self-pay | Admitting: Internal Medicine

## 2014-05-10 ENCOUNTER — Ambulatory Visit (INDEPENDENT_AMBULATORY_CARE_PROVIDER_SITE_OTHER): Payer: Commercial Managed Care - HMO | Admitting: *Deleted

## 2014-05-10 ENCOUNTER — Encounter: Payer: Self-pay | Admitting: Internal Medicine

## 2014-05-10 DIAGNOSIS — E042 Nontoxic multinodular goiter: Secondary | ICD-10-CM | POA: Diagnosis not present

## 2014-05-10 DIAGNOSIS — E119 Type 2 diabetes mellitus without complications: Secondary | ICD-10-CM | POA: Diagnosis not present

## 2014-05-10 DIAGNOSIS — I639 Cerebral infarction, unspecified: Secondary | ICD-10-CM | POA: Diagnosis not present

## 2014-05-10 DIAGNOSIS — M2550 Pain in unspecified joint: Secondary | ICD-10-CM | POA: Diagnosis not present

## 2014-05-10 DIAGNOSIS — E0500 Thyrotoxicosis with diffuse goiter without thyrotoxic crisis or storm: Secondary | ICD-10-CM | POA: Diagnosis not present

## 2014-05-10 LAB — MDC_IDC_ENUM_SESS_TYPE_REMOTE
Date Time Interrogation Session: 20160329041826
Zone Setting Detection Interval: 2000 ms
Zone Setting Detection Interval: 3000 ms
Zone Setting Detection Interval: 360 ms

## 2014-05-11 NOTE — Progress Notes (Signed)
Loop recorder 

## 2014-05-18 DIAGNOSIS — I10 Essential (primary) hypertension: Secondary | ICD-10-CM | POA: Diagnosis not present

## 2014-05-18 DIAGNOSIS — E0590 Thyrotoxicosis, unspecified without thyrotoxic crisis or storm: Secondary | ICD-10-CM | POA: Diagnosis not present

## 2014-05-18 DIAGNOSIS — Z6831 Body mass index (BMI) 31.0-31.9, adult: Secondary | ICD-10-CM | POA: Diagnosis not present

## 2014-05-18 DIAGNOSIS — E785 Hyperlipidemia, unspecified: Secondary | ICD-10-CM | POA: Diagnosis not present

## 2014-05-18 DIAGNOSIS — F3340 Major depressive disorder, recurrent, in remission, unspecified: Secondary | ICD-10-CM | POA: Diagnosis not present

## 2014-05-18 DIAGNOSIS — K219 Gastro-esophageal reflux disease without esophagitis: Secondary | ICD-10-CM | POA: Diagnosis not present

## 2014-05-18 DIAGNOSIS — E119 Type 2 diabetes mellitus without complications: Secondary | ICD-10-CM | POA: Diagnosis not present

## 2014-06-08 ENCOUNTER — Ambulatory Visit (INDEPENDENT_AMBULATORY_CARE_PROVIDER_SITE_OTHER): Payer: Commercial Managed Care - HMO | Admitting: *Deleted

## 2014-06-08 DIAGNOSIS — I639 Cerebral infarction, unspecified: Secondary | ICD-10-CM

## 2014-06-18 NOTE — Progress Notes (Signed)
Loop recorder 

## 2014-06-23 DIAGNOSIS — R0602 Shortness of breath: Secondary | ICD-10-CM | POA: Diagnosis not present

## 2014-06-23 DIAGNOSIS — M25812 Other specified joint disorders, left shoulder: Secondary | ICD-10-CM | POA: Diagnosis not present

## 2014-06-23 DIAGNOSIS — M25811 Other specified joint disorders, right shoulder: Secondary | ICD-10-CM | POA: Diagnosis not present

## 2014-06-23 DIAGNOSIS — M79602 Pain in left arm: Secondary | ICD-10-CM | POA: Diagnosis not present

## 2014-06-24 LAB — CUP PACEART REMOTE DEVICE CHECK
Date Time Interrogation Session: 20160430045205
Zone Setting Detection Interval: 2000 ms
Zone Setting Detection Interval: 3000 ms
Zone Setting Detection Interval: 360 ms

## 2014-06-28 ENCOUNTER — Other Ambulatory Visit: Payer: Self-pay

## 2014-06-28 DIAGNOSIS — Z1231 Encounter for screening mammogram for malignant neoplasm of breast: Secondary | ICD-10-CM

## 2014-07-01 ENCOUNTER — Encounter: Payer: Self-pay | Admitting: Internal Medicine

## 2014-07-01 DIAGNOSIS — E0500 Thyrotoxicosis with diffuse goiter without thyrotoxic crisis or storm: Secondary | ICD-10-CM | POA: Diagnosis not present

## 2014-07-08 ENCOUNTER — Ambulatory Visit (INDEPENDENT_AMBULATORY_CARE_PROVIDER_SITE_OTHER): Payer: Commercial Managed Care - HMO | Admitting: Neurology

## 2014-07-08 ENCOUNTER — Ambulatory Visit (INDEPENDENT_AMBULATORY_CARE_PROVIDER_SITE_OTHER): Payer: Commercial Managed Care - HMO | Admitting: *Deleted

## 2014-07-08 ENCOUNTER — Encounter: Payer: Self-pay | Admitting: Neurology

## 2014-07-08 VITALS — BP 117/82 | HR 74 | Wt 175.0 lb

## 2014-07-08 DIAGNOSIS — G9050 Complex regional pain syndrome I, unspecified: Secondary | ICD-10-CM

## 2014-07-08 DIAGNOSIS — E785 Hyperlipidemia, unspecified: Secondary | ICD-10-CM

## 2014-07-08 DIAGNOSIS — I639 Cerebral infarction, unspecified: Secondary | ICD-10-CM

## 2014-07-08 DIAGNOSIS — I6340 Cerebral infarction due to embolism of unspecified cerebral artery: Secondary | ICD-10-CM | POA: Diagnosis not present

## 2014-07-08 DIAGNOSIS — I10 Essential (primary) hypertension: Secondary | ICD-10-CM

## 2014-07-08 DIAGNOSIS — Z720 Tobacco use: Secondary | ICD-10-CM | POA: Diagnosis not present

## 2014-07-08 MED ORDER — LIDOCAINE 5 % EX OINT: 1.0000 "application " | TOPICAL_OINTMENT | CUTANEOUS | Status: DC | PRN

## 2014-07-08 NOTE — Patient Instructions (Addendum)
-   continue plavix and statin for stroke prevention - check BP and glucose at home - prescribed lidocaine cream for pain relieve. - Follow up with your primary care physician for stroke risk factor modification. Recommend maintain blood pressure goal <130/80, diabetes with hemoglobin A1c goal below 6.5% and lipids with LDL cholesterol goal below 70 mg/dL.  - [facility]. - compliant with medication - quit smoking - RTC PRN.

## 2014-07-08 NOTE — Progress Notes (Signed)
STROKE NEUROLOGY FOLLOW UP NOTE  NAME: Kara Mendoza DOB: 09/06/1950  REASON FOR VISIT: stroke follow up HISTORY FROM: pt and chart  Today we had the pleasure of seeing Kara Mendoza in follow-up at our Neurology Clinic. Pt was accompanied by no one.   History Summary Kara Mendoza is a 64 y.o. female with prior hx of left MCA distribution infarct in 2008 with residual language impairment, hypertension, dyslipidemia, tobacco abuse, admitted with stuttering speech and LUE pain and fluctuating weakness. Did not receive tPA due to unclear last known well. Imaging confirms chronic left parietal infarct with new extension into the left parietal operculum, left insula, and left inferior parietal lobe, surrounding the area of chronic infarction. Chronic microvascular ischemic change. 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 Stroke etiology felt to be embolic, though no source found (cryptogenic). Hypercoagulable work up so far unrevealing and TEE neg. Loop recorder inserted. She was discharged with plavix and zocor.  Follow up 09/29/13 - the patient has been doing better with speech although she will have stuttering if she is anxious. She still complains of LUE pain, painful to touch, stated that it sometimes swelling, cold, redness, which has been going for about one year. She will have outpt neurology referral by PCP to see Dr. Dionicia Abler at [facility] for this. She has Loop recorder implanted and so far she did not receive any phone call from cardiology.    Follow up 01/06/14 - she was doing a little bit better. TCD emboli detection negative. She had follow up with Dr. Dionicia Abler for left UE RDS and was put on gabapentin and discontinued lyrica. She felt pain is better controlled, however, she stated that combination of percocet and gabapentin is more effective but she has no percocet left. She needs to see Dr. Myna Hidalgo  for more percocet prescription. She also followed with cardiology and loop recorder so far no AF episodes. She also follows with Dr. Chestine Spore for thyroid issue for which she is taking methimazole. Otherwise, she has no complain of neurological deficit, she still has mild stuttering which is chronic. Pt continued smoking, although cutting down from 2PPD to 3 cig per day.  Interval History During the interval time, she still has pain on the left arm due to RDS. She followed up with Dr. Dionicia Abler in [facility] for RDS but she is currently seeing neurologist in [facility] for RDS and pain control. She is taking Cymbalta, gabapentin and Lyrica for the pain. She has not been taking Plavix, because she was told that Plavix can cause stroke in her. No recurrent strokelike symptoms, blood pressure today 117/82.  REVIEW OF SYSTEMS: Full 14 system review of systems performed and notable only for those listed below and in HPI above, all others are negative:  Constitutional: Activity change, fatigue, and unexpected weight change  Cardiovascular: palpitation  Ear/Nose/Throat: Ringing in ears  Skin: Moles  Eyes: blurry vision, double vision, eye discharge  Respiratory: N/A  Gastroitestinal: Swallow abdomen, constipation  Genitourinary: Incontinence of bladder, frequency of urination, urgency Hematology/Lymphatic: Bruce/bleed easily Endocrine: N/A  Musculoskeletal: joint pain, aching muscles , back pain, walking difficulty Allergy/Immunology: N/A  Neurological: Numbness, headache, memory loss, speech difficulty, weakness  Psychiatric: insomnia, restless leg, snoring, depression, nervous/anxious  The following represents the patient's updated allergies and side effects list: Allergies  Allergen Reactions  . Diclofenac Shortness Of Breath  . Aspirin Other (See Comments)  Possibly itching and "feeling little weird"  . Penicillins Hives    Labs since last visit of relevance include the following: Results for orders  placed or performed in visit on 06/08/14  Implantable device - remote  Result Value Ref Range   Date Time Interrogation Session [id number]    Pulse Generator Manufacturer [employer]    Pulse Gen Model [id number] Reveal LINQ    Pulse Gen Serial Number [id number]    Implantable Pulse Generator Type ICM/ILR    Implantable Pulse Generator Implant Date [id number]    Zone Setting Type Category VF    Zone Setting Type Category VT    Zone Setting Type Category VENTRICULAR_TACHYCARDIA_1    Zone Setting Detection Interval 360 ms   Zone Setting Type Category VENTRICULAR_TACHYCARDIA_2    Zone Setting Type Category ATRIAL_FIBRILLATION    Zone Setting Type Category ATAF    Zone Setting Type Category ASYSTOLE    Zone Setting Detection Interval 3000 ms   Zone Setting Type Category BRADYCARDIA    Zone Setting Detection Interval 2000 ms   Battery Status OK    Eval Rhythm SR     The neurologically relevant items on the patient's problem list were reviewed on today's visit.  Neurologic Examination  A problem focused neurological exam (12 or more points of the single system neurologic examination, vital signs counts as 1 point, cranial nerves count for 8 points) was performed.  Blood pressure 117/82, pulse 74, weight 175 lb (79.379 kg).  General - Well nourished, well developed, in no apparent distress.  Ophthalmologic - not able to see through.  Cardiovascular - Regular rate and rhythm with no murmur.  Mental Status -  Level of arousal and orientation to time, place, and person were intact. Language including expression, naming, repetition, comprehension was assessed and found intact., but with stuttering if anxious  Cranial Nerves II - XII - II - Visual field intact OU. III, IV, VI - Extraocular movements intact. V - Facial sensation intact bilaterally. VII - Facial movement intact bilaterally. VIII - Hearing & vestibular intact bilaterally. X - Palate elevates symmetrically, mild  stuttering. XI - Chin turning & shoulder shrug intact bilaterally. XII - Tongue protrusion intact.  Motor Strength - The patient's strength was normal in all extremities and pronator drift was absent.  Bulk was normal and fasciculations were absent.  Motor Tone - Muscle tone was assessed at the neck and appendages and was normal.  Reflexes - The patient's reflexes were 1+ in all extremities and she had no pathological reflexes.  Sensory - sensitive and painful on touch at left arm from mid-arm down to hand, much improved from last visit.  Coordination - The patient had normal movements in the hands with no ataxia or dysmetria.  Tremor was absent.  Gait and Station - normal gait.  Data reviewed: I personally reviewed the images and agree with the radiology interpretations.  Dg Chest 2 View  08/09/2013 IMPRESSION: No active cardiopulmonary disease.  Ct Head Wo Contrast  08/09/2013 IMPRESSION: Stable chronic left MCA distribution infarction. No acute intracranial abnormality.  Ct Angio Head and neck W/cm &/or Wo Cm  08/09/2013 IMPRESSION: Negative study. No evidence of atherosclerotic narrowing or dissection. No vessel occlusion. No aneurysm or vascular malformation.  MRI/MRA  08/10/2013 IMPRESSION: Chronic left parietal infarct. Chronic microvascular ischemic change. Extension of acute infarct into the left parietal operculum, left insula, and left inferior parietal lobe, surrounding the area of chronic infarction. Small irregular distal left vertebral artery. 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  Carotid Doppler CTA neck done  2D Echocardiogram 08/10/13 EF 55-60%, no cardiac source of emboli identified  LUE doppler There is no DVT or SVT noted in the left upper extremity.  Transesophageal Echocardiogram 08/12/13 Impression:  1) No source of embolus  2) Negative bubble study  3) Normal EF 60%  4) No significant valve  disease  5) No LAA thrombus  6) No aortic debris  7) Normal RV  8) No effusion  EKG 08/09/13 Sinus rhythm  RAE, consider biatrial enlargement  Anteroseptal infarct, old    Component     Latest Ref Rng 08/11/2013  Anticardiolipin IgA     <22 APL U/mL 3 (L)  Anticardiolipin IgG     <23 GPL U/mL 2 (L)  Anticardiolipin IgM     <11 MPL U/mL 3 (L)  PTT Lupus Anticoagulant     28.0 - 43.0 secs 33.0  PTTLA Confirmation     <8.0 secs NOT APPL  PTTLA 4:1 Mix     28.0 - 43.0 secs NOT APPL  DRVVT     <42.9 secs 32.5  Drvvt confirmation     <1.15 Ratio NOT APPL  dRVVT Incubated 1:1 Mix     <42.9 secs NOT APPL  Lupus Anticoagulant     NOT DETECTED NOT DETECTED  Phosphatydalserine, IgG     <16 U/mL 4  Phosphatydalserine, IgM     <22 U/mL 7  Phosphatydalserine, IgA     <20 U/mL 5  Total Protein     6.0 - 8.3 g/dL 6.6  Albumin     3.5 - 5.2 g/dL 3.7  AST     0 - 37 U/L 14  ALT     0 - 35 U/L 15  Alkaline Phosphatase     39 - 117 U/L 59  Total Bilirubin     0.3 - 1.2 mg/dL 0.6  Bilirubin, Direct     0.0 - 0.3 mg/dL <1.6  Indirect Bilirubin     0.3 - 0.9 mg/dL NOT CALCULATED  c-ANCA Screen     NEGATIVE NEGATIVE  p-ANCA Screen     NEGATIVE NEGATIVE  Atypical p-ANCA Screen     NEGATIVE NEGATIVE  TSH     0.350 - 4.500 uIU/mL   Vitamin B-12     211 - 911 pg/mL 1016 (H)  Folate      >20.0  RPR     NON REAC NON REAC  HIV     NONREACTIVE NONREACTIVE  Sed Rate     0 - 22 mm/hr 3  CRP, High Sensitivity      1.3  C3 Complement     90 - 180 mg/dL 109  Complement C4, Body Fluid     10 - 40 mg/dL 28  ds DNA Ab      <1  Homocysteine     4.0 - 15.4 umol/L 6.5  SSA (Ro) (ENA) Antibody, IgG     <1.0 NEG AI <1.0 NEG  SSB (La) (ENA) Antibody, IgG     <1.0 NEG AI <1.0 NEG  Rheumatoid Factor     <=14 IU/mL <10    Assessment: As you may recall, she is a 64 y.o. African American female with PMH of left MCA distribution infarct in 2008 with residual language impairment,  hypertension, dyslipidemia, tobacco abuse, found to have again left parietal and frontal small embolic pattern strokes, appears like  extension from previous 2008 stroke. Felt like embolic, work up so far negative and hypercoag labs were not remarkable. Consider cryptogenic stroke. Loop recorder so far negative for AF episodes. TCD emboli monitoring negative for MES. Talked with pt regarding RESPECT ESUS trial and she refused to participate. Continue plavix and statin for now.  Plan:  - continue plavix and statin for stroke prevention - check BP and glucose at home - prescribed lidocaine cream for ;pcal pain relieve. - Follow up with your primary care physician for stroke risk factor modification. Recommend maintain blood pressure goal <130/80, diabetes with hemoglobin A1c goal below 6.5% and lipids with LDL cholesterol goal below 70 mg/dL.  - [facility]. - quit smoking - RTC PRN.  No orders of the defined types were placed in this encounter.   Meds ordered this encounter  Medications  . zolpidem (AMBIEN) 5 MG tablet    Sig: Take 5 mg by mouth.  . lidocaine (XYLOCAINE) 5 % ointment    Sig: Apply 1 application topically as needed.    Dispense:  35.44 g    Refill:  0    Patient Instructions  - continue plavix and statin for stroke prevention - check BP and glucose at home - prescribed lidocaine cream for pain relieve. - Follow up with your primary care physician for stroke risk factor modification. Recommend maintain blood pressure goal <130/80, diabetes with hemoglobin A1c goal below 6.5% and lipids with LDL cholesterol goal below 70 mg/dL.  - [facility]. - compliant with medication - quit smoking - RTC PRN.    Marvel PlanJindong Asia Dusenbury, MD PhD [facility] [address], [address] [place], [place] [address] [phone]

## 2014-07-09 NOTE — Progress Notes (Signed)
Loop recorder 

## 2014-07-22 LAB — CUP PACEART REMOTE DEVICE CHECK
Date Time Interrogation Session: 20160518024421
Zone Setting Detection Interval: 2000 ms
Zone Setting Detection Interval: 3000 ms
Zone Setting Detection Interval: 360 ms

## 2014-08-06 ENCOUNTER — Ambulatory Visit (INDEPENDENT_AMBULATORY_CARE_PROVIDER_SITE_OTHER): Payer: Commercial Managed Care - HMO | Admitting: *Deleted

## 2014-08-06 DIAGNOSIS — I639 Cerebral infarction, unspecified: Secondary | ICD-10-CM

## 2014-08-11 LAB — CUP PACEART REMOTE DEVICE CHECK: Date Time Interrogation Session: 20160706151514

## 2014-08-11 NOTE — Progress Notes (Signed)
Loop recorder 

## 2014-08-16 ENCOUNTER — Ambulatory Visit: Payer: Commercial Managed Care - HMO

## 2014-08-19 ENCOUNTER — Encounter: Payer: Self-pay | Admitting: Internal Medicine

## 2014-08-30 ENCOUNTER — Encounter: Payer: Self-pay | Admitting: Internal Medicine

## 2014-09-06 ENCOUNTER — Ambulatory Visit (INDEPENDENT_AMBULATORY_CARE_PROVIDER_SITE_OTHER): Payer: Commercial Managed Care - HMO | Admitting: *Deleted

## 2014-09-06 DIAGNOSIS — I639 Cerebral infarction, unspecified: Secondary | ICD-10-CM

## 2014-09-08 NOTE — Progress Notes (Signed)
Loop recorder 

## 2014-09-10 ENCOUNTER — Encounter: Payer: Self-pay | Admitting: Internal Medicine

## 2014-09-20 LAB — CUP PACEART REMOTE DEVICE CHECK: Date Time Interrogation Session: 20160815084037

## 2014-09-30 ENCOUNTER — Encounter: Payer: Self-pay | Admitting: Cardiology

## 2014-10-07 ENCOUNTER — Encounter: Payer: Self-pay | Admitting: Cardiology

## 2014-10-13 ENCOUNTER — Encounter: Payer: Self-pay | Admitting: Internal Medicine

## 2014-10-15 ENCOUNTER — Encounter: Payer: Self-pay | Admitting: Cardiology

## 2014-11-05 ENCOUNTER — Ambulatory Visit (INDEPENDENT_AMBULATORY_CARE_PROVIDER_SITE_OTHER): Payer: Medicare (Managed Care) | Admitting: *Deleted

## 2014-11-05 DIAGNOSIS — I639 Cerebral infarction, unspecified: Secondary | ICD-10-CM | POA: Diagnosis not present

## 2014-11-09 ENCOUNTER — Encounter: Payer: Self-pay | Admitting: *Deleted

## 2014-11-09 NOTE — Progress Notes (Signed)
Loop recorder 

## 2014-11-10 LAB — CUP PACEART REMOTE DEVICE CHECK: Date Time Interrogation Session: 20160930193710

## 2014-11-10 NOTE — Progress Notes (Signed)
Carelink summary report received. Battery status OK. Normal device function. No new symptom episodes, tachy episodes, brady, or pause episodes. No new AF episodes. Monthly summary reports. Due to see SK.

## 2014-11-15 ENCOUNTER — Encounter: Payer: Self-pay | Admitting: Internal Medicine

## 2014-11-30 ENCOUNTER — Telehealth: Payer: Self-pay | Admitting: *Deleted

## 2014-11-30 NOTE — Telephone Encounter (Signed)
Spoke with patient regarding tachy episode on 11/25/14.  Patient states that she has palpitations "all the time", but states that she thinks the episode time was around the time when the police called to tell her that her son had been arrested.  Patient states that she was very anxious and felt her heart rate speed up, but that she was able to calm herself down.  Will review episode EGM with Dr. Graciela HusbandsKlein and if he has any additional recommendations, will call patient back.  Patient aware to call with worsening symptoms, questions, or concerns.  She denies any additional questions at this time.

## 2014-11-30 NOTE — Telephone Encounter (Signed)
Reviewed strip with Dr. Graciela HusbandsKlein, episode is likely not AF, very brief in duration, no additional recommendations at this time.

## 2014-12-05 DIAGNOSIS — I639 Cerebral infarction, unspecified: Secondary | ICD-10-CM

## 2014-12-06 ENCOUNTER — Ambulatory Visit (INDEPENDENT_AMBULATORY_CARE_PROVIDER_SITE_OTHER): Payer: Medicare Other | Admitting: *Deleted

## 2014-12-06 DIAGNOSIS — I639 Cerebral infarction, unspecified: Secondary | ICD-10-CM

## 2014-12-07 NOTE — Progress Notes (Signed)
Loop recorder 

## 2014-12-10 ENCOUNTER — Ambulatory Visit (INDEPENDENT_AMBULATORY_CARE_PROVIDER_SITE_OTHER): Payer: Medicare Other | Admitting: Emergency Medicine

## 2014-12-10 VITALS — BP 128/80 | HR 115 | Temp 98.4°F | Resp 20 | Ht 63.0 in | Wt 176.2 lb

## 2014-12-10 DIAGNOSIS — I10 Essential (primary) hypertension: Secondary | ICD-10-CM | POA: Diagnosis not present

## 2014-12-10 DIAGNOSIS — G9050 Complex regional pain syndrome I, unspecified: Secondary | ICD-10-CM

## 2014-12-10 DIAGNOSIS — E785 Hyperlipidemia, unspecified: Secondary | ICD-10-CM

## 2014-12-10 DIAGNOSIS — I6340 Cerebral infarction due to embolism of unspecified cerebral artery: Secondary | ICD-10-CM | POA: Diagnosis not present

## 2014-12-10 DIAGNOSIS — E119 Type 2 diabetes mellitus without complications: Secondary | ICD-10-CM | POA: Diagnosis not present

## 2014-12-10 LAB — POCT GLYCOSYLATED HEMOGLOBIN (HGB A1C): Hemoglobin A1C: 6

## 2014-12-10 LAB — HEMOGLOBIN A1C: Hgb A1c MFr Bld: 6 % (ref 4.0–6.0)

## 2014-12-10 MED ORDER — DULOXETINE HCL 20 MG PO CPEP: 20.0000 mg | ORAL_CAPSULE | Freq: Every day | ORAL | Status: DC

## 2014-12-10 MED ORDER — GABAPENTIN ENACARBIL ER 600 MG PO TBCR: 600.0000 mg | EXTENDED_RELEASE_TABLET | Freq: Two times a day (BID) | ORAL | Status: DC

## 2014-12-10 MED ORDER — CLOPIDOGREL BISULFATE 75 MG PO TABS: 75.0000 mg | ORAL_TABLET | Freq: Every day | ORAL | Status: DC

## 2014-12-10 MED ORDER — PREGABALIN 100 MG PO CAPS: 100.0000 mg | ORAL_CAPSULE | Freq: Three times a day (TID) | ORAL | Status: DC

## 2014-12-10 MED ORDER — SERTRALINE HCL 50 MG PO TABS: 50.0000 mg | ORAL_TABLET | Freq: Every day | ORAL | Status: DC

## 2014-12-10 MED ORDER — ZOLPIDEM TARTRATE 5 MG PO TABS: 5.0000 mg | ORAL_TABLET | Freq: Every evening | ORAL | Status: DC | PRN

## 2014-12-10 NOTE — Progress Notes (Signed)
Subjective:  Patient ID: Kara Mendoza, female    DOB: [birth year]  Age: 64 y.o. MRN: [id number]  CC: OTHER and Depression   HPI Kara Mendoza presents  for reevaluation and renewal of her prescription medications. She's had a past to strokes and was seen last by Dr. Roda Shutters who prescribed her medications. [facility] then [facility] and they apparently changed her medications and made her "sick" he has subsequently stopped taking all of her medications 3 months ago and has stopped going to [facility] and has expressed an interest in going back on her original medication. She has no new complaints she has no new health concerns. She complains bitterly of pain in her left arm. She has reflex sympathetic dystrophy that's been difficult to treat she's been to both [facility] and [facility] for care physician been unsatisfied with that treatment.  History Kara Mendoza has a past medical history of Neuromuscular disorder (HCC); Hypertension; Hyperlipidemia; Asthma; Diabetes mellitus (HCC); Hyperthyroidism; Esophageal reflux; Constipation; Major depressive disorder, recurrent episode, moderate (HCC); Anxiety state, unspecified; History of fall; Abnormality of gait; Chronic pain syndrome; Tobacco use disorder; Osteoarthritis; Dysarthria; Acute bronchitis; Chronic arm pain (2008); Chronic leg pain (2008); Fatigue; Chronic headache; Stroke [facility]); LOOP Recorder (10/14/2013); Anxiety; Memory loss; and Allergy.   She has past surgical history that includes Cesarean section; Finger surgery; Loop recorder implant (08-12-2013); TEE without cardioversion (N/A, 08/12/2013); and loop recorder implant (N/A, 08/12/2013).   Her  family history includes Cancer in her father; Diabetes in her brother and sister.  She   reports that she has been smoking Cigarettes.  She has a 3 pack-year smoking history. She has never used smokeless tobacco. She reports that she does not drink alcohol or use illicit  drugs.  Outpatient Prescriptions Prior to Visit  Medication Sig Dispense Refill  . albuterol (PROVENTIL HFA;VENTOLIN HFA) 108 (90 BASE) MCG/ACT inhaler Inhale 3 puffs into the lungs every 6 (six) hours as needed for wheezing or shortness of breath.     . losartan-hydrochlorothiazide (HYZAAR) 100-25 MG per tablet Take 1 tablet by mouth daily. 30 tablet 1  . metFORMIN (GLUCOPHAGE) 500 MG tablet Take 1 tablet (500 mg total) by mouth daily. 30 tablet 1  . methimazole (TAPAZOLE) 5 MG tablet Take 1 tablet (5 mg total) by mouth daily. 30 tablet 1  . Multiple Vitamins-Calcium (ONE-A-DAY WOMENS FORMULA PO) Take 1 tablet by mouth daily.    . nortriptyline (PAMELOR) 10 MG capsule Take 1 capsule (10 mg total) by mouth at bedtime. 30 capsule 1  . omeprazole (PRILOSEC) 20 MG capsule Take 1 capsule (20 mg total) by mouth 2 (two) times daily. 60 capsule 1  . oxyCODONE-acetaminophen (PERCOCET) 10-325 MG per tablet Take 1 tablet by mouth every 8 (eight) hours as needed for pain.    . simvastatin (ZOCOR) 40 MG tablet Take 1 tablet (40 mg total) by mouth at bedtime. 30 tablet 1  . clopidogrel (PLAVIX) 75 MG tablet Take 1 tablet (75 mg total) by mouth daily. 30 tablet 1  . DULoxetine (CYMBALTA) 20 MG capsule Take 1 capsule (20 mg total) by mouth daily. 30 capsule 3  . Gabapentin Enacarbil (HORIZANT) 600 MG TBCR Take 600 mg by mouth 2 (two) times daily.     . pregabalin (LYRICA) 100 MG capsule Take 1 capsule (100 mg total) by mouth 3 (three) times daily. 90 capsule 1  . sertraline (ZOLOFT) 50 MG tablet Take 1 tablet (50 mg total) by mouth at  bedtime. 30 tablet 1  . zolpidem (AMBIEN) 5 MG tablet Take 5 mg by mouth.    . lidocaine (XYLOCAINE) 5 % ointment Apply 1 application topically as needed. (Patient not taking: Reported on 12/10/2014) 35.44 g 0   No facility-administered medications prior to visit.    Social History   Social History  . Marital Status: Single    Spouse Name: N/A  . Number of Children: 3  .  Years of Education: College   Occupational History  .  Other    Disabled   Social History Main Topics  . Smoking status: Current Some Day Smoker -- 0.20 packs/day for 15 years    Types: Cigarettes  . Smokeless tobacco: Never Used     Comment: Tryin to quit, 07/08/14 smoking 3-4 a day  . Alcohol Use: No  . Drug Use: No  . Sexual Activity: Not Asked   Other Topics Concern  . None   Social History Narrative   Patient is single with 3 children.   Patient is right handed.   Patient has college education.   Patient drinks 2 cups daily.     Review of Systems  Constitutional: Negative for fever, chills and appetite change.  HENT: Negative for congestion, ear pain, postnasal drip, sinus pressure and sore throat.   Eyes: Negative for pain and redness.  Respiratory: Negative for cough, shortness of breath and wheezing.   Cardiovascular: Negative for leg swelling.  Gastrointestinal: Negative for nausea, vomiting, abdominal pain, diarrhea, constipation and blood in stool.  Endocrine: Negative for polyuria.  Genitourinary: Negative for dysuria, urgency, frequency and flank pain.  Musculoskeletal: Negative for gait problem.  Skin: Negative for rash.  Neurological: Positive for weakness. Negative for headaches.  Psychiatric/Behavioral: Negative for confusion and decreased concentration. The patient is not nervous/anxious.     Objective:  BP 128/80 mmHg  Pulse 115  Temp(Src) 98.4 F (36.9 C) (Oral)  Resp 20  Ht 5\' 3"  (1.6 m)  Wt 176 lb 3.2 oz (79.924 kg)  BMI 31.22 kg/m2  SpO2 98%  Physical Exam  Constitutional: She is oriented to person, place, and time. She appears well-developed and well-nourished. No distress.  HENT:  Head: Normocephalic and atraumatic.  Right Ear: External ear normal.  Left Ear: External ear normal.  Nose: Nose normal.  Eyes: Conjunctivae and EOM are normal. Pupils are equal, round, and reactive to light. No scleral icterus.  Neck: Normal range of  motion. Neck supple. No tracheal deviation present.  Cardiovascular: Normal rate, regular rhythm and normal heart sounds.   Pulmonary/Chest: Effort normal. No respiratory distress. She has no wheezes. She has no rales.  Abdominal: She exhibits no mass. There is no tenderness. There is no rebound and no guarding.  Musculoskeletal: She exhibits no edema.  Lymphadenopathy:    She has no cervical adenopathy.  Neurological: She is alert and oriented to person, place, and time. Coordination normal.  Skin: Skin is warm and dry. No rash noted.  Psychiatric: She has a normal mood and affect. Her behavior is normal.   She has limited use of her left arm due to pain and swelling. She has a marked stutter    Assessment & Plan:   Kara Mendoza was seen today for other and depression.  Diagnoses and all orders for this visit:  Cerebral infarction due to embolism of cerebral artery (HCC) -     POCT glycosylated hemoglobin (Hb A1C) -     Comprehensive metabolic panel -     Lipid  panel -     CBC with Differential/Platelet -     TSH -     Ambulatory referral to Neurology  Essential hypertension -     POCT glycosylated hemoglobin (Hb A1C) -     Comprehensive metabolic panel -     Lipid panel -     CBC with Differential/Platelet -     TSH  Hyperlipidemia -     POCT glycosylated hemoglobin (Hb A1C) -     Comprehensive metabolic panel -     Lipid panel -     CBC with Differential/Platelet -     TSH  RSD (reflex sympathetic dystrophy) -     POCT glycosylated hemoglobin (Hb A1C) -     Comprehensive metabolic panel -     Lipid panel -     CBC with Differential/Platelet -     TSH -     zolpidem (AMBIEN) 5 MG tablet; Take 1 tablet (5 mg total) by mouth at bedtime as needed for sleep. -     Ambulatory referral to Neurology  Diabetes mellitus without complication (HCC) -     POCT glycosylated hemoglobin (Hb A1C)  Other orders -     clopidogrel (PLAVIX) 75 MG tablet; Take 1 tablet (75 mg total) by  mouth daily. -     sertraline (ZOLOFT) 50 MG tablet; Take 1 tablet (50 mg total) by mouth at bedtime. -     Gabapentin Enacarbil (HORIZANT) 600 MG TBCR; Take 600 mg by mouth 2 (two) times daily. -     DULoxetine (CYMBALTA) 20 MG capsule; Take 1 capsule (20 mg total) by mouth daily. -     pregabalin (LYRICA) 100 MG capsule; Take 1 capsule (100 mg total) by mouth 3 (three) times daily.   I have changed Ms. Ryther zolpidem. I am also having her maintain her Multiple Vitamins-Calcium (ONE-A-DAY WOMENS FORMULA PO), albuterol, losartan-hydrochlorothiazide, metFORMIN, methimazole, nortriptyline, omeprazole, simvastatin, oxyCODONE-acetaminophen, lidocaine, clopidogrel, sertraline, Gabapentin Enacarbil, DULoxetine, and pregabalin.  Meds ordered this encounter  Medications  . clopidogrel (PLAVIX) 75 MG tablet    Sig: Take 1 tablet (75 mg total) by mouth daily.    Dispense:  30 tablet    Refill:  3  . sertraline (ZOLOFT) 50 MG tablet    Sig: Take 1 tablet (50 mg total) by mouth at bedtime.    Dispense:  30 tablet    Refill:  2  . Gabapentin Enacarbil (HORIZANT) 600 MG TBCR    Sig: Take 600 mg by mouth 2 (two) times daily.    Dispense:  60 tablet    Refill:  2  . DULoxetine (CYMBALTA) 20 MG capsule    Sig: Take 1 capsule (20 mg total) by mouth daily.    Dispense:  30 capsule    Refill:  3  . zolpidem (AMBIEN) 5 MG tablet    Sig: Take 1 tablet (5 mg total) by mouth at bedtime as needed for sleep.    Dispense:  30 tablet    Refill:  2  . pregabalin (LYRICA) 100 MG capsule    Sig: Take 1 capsule (100 mg total) by mouth 3 (three) times daily.    Dispense:  90 capsule    Refill:  1   I spent quite a lengthy time with her discussing her medications and the need to take medications and she's been prescribed prickly the Plavix and her diabetes medicine. She is agreed to do that and will follow-up in a month  Appropriate  red flag conditions were discussed with the patient as well as actions that  should be taken.  Patient expressed his understanding.  Follow-up: Return in about 1 month (around 01/09/2015).  Carmelina Dane, MD

## 2014-12-10 NOTE — Patient Instructions (Signed)

## 2014-12-11 LAB — CBC WITH DIFFERENTIAL/PLATELET
Basophils Absolute: 0 10*3/uL (ref 0.0–0.1)
Basophils Relative: 0 % (ref 0–1)
Eosinophils Absolute: 0.1 10*3/uL (ref 0.0–0.7)
Eosinophils Relative: 2 % (ref 0–5)
HCT: 43.8 % (ref 36.0–46.0)
Hemoglobin: 14.5 g/dL (ref 12.0–15.0)
Lymphocytes Relative: 24 % (ref 12–46)
Lymphs Abs: 1.5 10*3/uL (ref 0.7–4.0)
MCH: 28.7 pg (ref 26.0–34.0)
MCHC: 33.1 g/dL (ref 30.0–36.0)
MCV: 86.7 fL (ref 78.0–100.0)
MPV: 9.1 fL (ref 8.6–12.4)
Monocytes Absolute: 0.4 10*3/uL (ref 0.1–1.0)
Monocytes Relative: 6 % (ref 3–12)
Neutro Abs: 4.1 10*3/uL (ref 1.7–7.7)
Neutrophils Relative %: 68 % (ref 43–77)
Platelets: 393 10*3/uL (ref 150–400)
RBC: 5.05 MIL/uL (ref 3.87–5.11)
RDW: 12.8 % (ref 11.5–15.5)
WBC: 6.1 10*3/uL (ref 4.0–10.5)

## 2014-12-11 LAB — COMPREHENSIVE METABOLIC PANEL
ALT: 18 U/L (ref 6–29)
AST: 17 U/L (ref 10–35)
Albumin: 4.1 g/dL (ref 3.6–5.1)
Alkaline Phosphatase: 75 U/L (ref 33–130)
BUN: 15 mg/dL (ref 7–25)
CO2: 28 mmol/L (ref 20–31)
Calcium: 10.5 mg/dL — ABNORMAL HIGH (ref 8.6–10.4)
Chloride: 99 mmol/L (ref 98–110)
Creat: 0.87 mg/dL (ref 0.50–0.99)
Glucose, Bld: 113 mg/dL — ABNORMAL HIGH (ref 65–99)
Potassium: 4.6 mmol/L (ref 3.5–5.3)
Sodium: 138 mmol/L (ref 135–146)
Total Bilirubin: 0.7 mg/dL (ref 0.2–1.2)
Total Protein: 6.8 g/dL (ref 6.1–8.1)

## 2014-12-11 LAB — LIPID PANEL
Cholesterol: 193 mg/dL (ref 125–200)
HDL: 52 mg/dL (ref >=46)
LDL Cholesterol: 124 mg/dL (ref <130)
Total CHOL/HDL Ratio: 3.7 Ratio (ref <=5.0)
Triglycerides: 87 mg/dL (ref <150)
VLDL: 17 mg/dL (ref <30)

## 2014-12-11 LAB — TSH: TSH: 0.268 u[IU]/mL — ABNORMAL LOW (ref 0.350–4.500)

## 2014-12-13 ENCOUNTER — Encounter: Payer: Self-pay | Admitting: Internal Medicine

## 2014-12-15 ENCOUNTER — Encounter: Payer: Self-pay | Admitting: Family Medicine

## 2014-12-15 ENCOUNTER — Encounter: Payer: Commercial Managed Care - HMO | Admitting: Internal Medicine

## 2014-12-17 ENCOUNTER — Encounter: Payer: Self-pay | Admitting: Internal Medicine

## 2014-12-22 ENCOUNTER — Encounter: Payer: Medicare (Managed Care) | Admitting: Internal Medicine

## 2014-12-23 ENCOUNTER — Ambulatory Visit (INDEPENDENT_AMBULATORY_CARE_PROVIDER_SITE_OTHER): Payer: Medicare Other | Admitting: Neurology

## 2014-12-23 ENCOUNTER — Encounter: Payer: Self-pay | Admitting: Neurology

## 2014-12-23 VITALS — BP 125/79 | HR 75 | Ht 62.0 in | Wt 181.6 lb

## 2014-12-23 DIAGNOSIS — I10 Essential (primary) hypertension: Secondary | ICD-10-CM | POA: Diagnosis not present

## 2014-12-23 DIAGNOSIS — G9050 Complex regional pain syndrome I, unspecified: Secondary | ICD-10-CM | POA: Diagnosis not present

## 2014-12-23 DIAGNOSIS — I63412 Cerebral infarction due to embolism of left middle cerebral artery: Secondary | ICD-10-CM | POA: Diagnosis not present

## 2014-12-23 DIAGNOSIS — I6340 Cerebral infarction due to embolism of unspecified cerebral artery: Secondary | ICD-10-CM

## 2014-12-23 DIAGNOSIS — E785 Hyperlipidemia, unspecified: Secondary | ICD-10-CM | POA: Diagnosis not present

## 2014-12-23 DIAGNOSIS — Z720 Tobacco use: Secondary | ICD-10-CM

## 2014-12-23 MED ORDER — LIDOCAINE 5 % EX OINT: 1.0000 "application " | TOPICAL_OINTMENT | CUTANEOUS | Status: DC | PRN

## 2014-12-23 NOTE — Patient Instructions (Signed)
-   continue plavix and statin for stroke prevention - check BP and glucose at home - prescribed lidocaine cream again for pain relieve. - Follow up with your primary care physician for stroke risk factor modification. Recommend maintain blood pressure goal <130/80, diabetes with hemoglobin A1c goal below 6.5% and lipids with LDL cholesterol goal below 70 mg/dL.  - [facility] syndrome (CRPS). - compliant with medication - quit smoking - follow up as needed.

## 2014-12-23 NOTE — Progress Notes (Signed)
STROKE NEUROLOGY FOLLOW UP NOTE  NAME: Kara HammockSheila L Mendoza DOB: 08-23-1950  REASON FOR VISIT: stroke follow up HISTORY FROM: pt and chart  Today we had the pleasure of seeing Kara Mendoza in follow-up at our Neurology Clinic. Pt was accompanied by no one.   History Summary Kara Mendoza is a 64 y.o. female with prior hx of left MCA distribution infarct in 2008 with residual language impairment, hypertension, dyslipidemia, tobacco abuse, admitted with stuttering speech and LUE pain and fluctuating weakness. Did not receive tPA due to unclear last known well. Imaging confirms chronic left parietal infarct with new extension into the left parietal operculum, left insula, and left inferior parietal lobe, surrounding the area of chronic infarction. Chronic microvascular ischemic change. 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 Stroke etiology felt to be embolic, though no source found (cryptogenic). Hypercoagulable work up so far unrevealing and TEE neg. Loop recorder inserted. Kara Mendoza was discharged with plavix and zocor.  Follow up 09/29/13 - the patient has been doing better with speech although Kara Mendoza will have stuttering if Kara Mendoza is anxious. Kara Mendoza still complains of LUE pain, painful to touch, stated that it sometimes swelling, cold, redness, which has been going for about one year. Kara Mendoza will have outpt neurology referral by PCP to [facility]. Kara Mendoza has Loop recorder implanted and so far Kara Mendoza did not receive any phone call from cardiology.    Follow up 01/06/14 - Kara Mendoza was doing a little bit better. TCD emboli detection negative. Kara Mendoza had follow up with Dr. Dionicia AblerGuzik for left UE RDS and was put on gabapentin and discontinued lyrica. Kara Mendoza felt pain is better controlled, however, Kara Mendoza stated that combination of percocet and gabapentin is more effective but Kara Mendoza has no percocet left. Kara Mendoza needs to see Dr. Myna HidalgoAjam  for more percocet prescription. Kara Mendoza also followed with cardiology and loop recorder so far no AF episodes. Kara Mendoza also follows with Dr. Chestine Sporelark for thyroid issue for which Kara Mendoza is taking methimazole. Otherwise, Kara Mendoza has no complain of neurological deficit, Kara Mendoza still has mild stuttering which is chronic. Pt continued smoking, although cutting down from 2PPD to 3 cig per day.  07/08/14 follow up - Kara Mendoza still has pain on the left arm due to RSD. Kara Mendoza followed up with [facility] but Kara Mendo[facility] is [facility], concerning for RSD or CRPS in another term. Kara Mendoza is taking Cymbalta, gabapentin and Lyrica for the pain. Kara Mendoza has not been taking Plavix, because Kara Mendoza was told that Plavix can cause stroke in her. No recurrent strokelike symptoms, blood pressure today 117/82.  Interval History During the interval time, Kara Mendoza still has pain on the left arm due to RSD. Kara Mendoza has been [facility] and [facility] for this. As per pt, Kara Mendoza was recommended to follow up with [facility] for some new procedures for treatment of RSD. K[facility]. Kara Mendoza continued to experience pain in the left arm, swelling, hot feeling, up to the upper arm. Lidocaine cream helped but Kara Mendoza ran out of it. I encouraged her to continue follow up with [facility] neurolog[facility] for RSD and will refill her lidocaine cream.   REVIEW OF SYSTEMS: Full 14 system review of systems performed and notable only for those listed below and in HPI above, all others are  negative:  Constitutional: Activity change, fatigue, and unexpected weight change  Cardiovascular: palpitation  Ear/Nose/Throat: Ringing in ears  Skin: Moles  Eyes: blurry vision, double vision, eye discharge  Respiratory: N/A  Gastroitestinal: Swallow abdomen, constipation  Genitourinary: Incontinence of bladder, frequency of urination, urgency Hematology/Lymphatic: Bruce/bleed easily Endocrine: N/A  Musculoskeletal: joint  pain, aching muscles , back pain, walking difficulty Allergy/Immunology: N/A  Neurological: Numbness, headache, memory loss, speech difficulty, weakness  Psychiatric: insomnia, restless leg, snoring, depression, nervous/anxious  The following represents the patient's updated allergies and side effects list: Allergies  Allergen Reactions  . Diclofenac Shortness Of Breath  . Aspirin Other (See Comments)    Possibly itching and "feeling little weird"  . Penicillins Hives    Labs since last visit of relevance include the following: Results for orders placed or performed in visit on 12/15/14  Hemoglobin A1c  Result Value Ref Range   Hgb A1c MFr Bld 6.0 4.0 - 6.0 %    The neurologically relevant items on the patient's problem list were reviewed on today's visit.  Neurologic Examination  A problem focused neurological exam (12 or more points of the single system neurologic examination, vital signs counts as 1 point, cranial nerves count for 8 points) was performed.  Blood pressure 125/79, pulse 75, height  (1.575 m), weight 181 lb 9.6 oz (82.373 kg).  General - Well nourished, well developed, in no apparent distress.  Ophthalmologic - not able to see through.  Cardiovascular - Regular rate and rhythm with no murmur.  Mental Status -  Level of arousal and orientation to time, place, and person were intact. Language including expression, naming, repetition, comprehension was assessed and found intact., but with stuttering if anxious  Cranial Nerves II - XII - II - Visual field intact OU. III, IV, VI - Extraocular movements intact. V - Facial sensation intact bilaterally. VII - Facial movement intact bilaterally. VIII - Hearing & vestibular intact bilaterally. X - Palate elevates symmetrically, mild stuttering. XI - Chin turning & shoulder shrug intact bilaterally. XII - Tongue protrusion intact.  Motor Strength - The patient's strength was normal in all extremities and  pronator drift was absent.  Bulk was normal and fasciculations were absent.  Motor Tone - Muscle tone was assessed at the neck and appendages and was normal.  Reflexes - The patient's reflexes were 1+ in all extremities and Kara Mendoza had no pathological reflexes.  Sensory - sensitive and painful on touch at left arm from mid-arm down to hand, much improved from last visit.  Coordination - The patient had normal movements in the hands with no ataxia or dysmetria.  Tremor was absent.  Gait and Station - normal gait.  Data reviewed: I personally reviewed the images and agree with the radiology interpretations.  Dg Chest 2 View  08/09/2013 IMPRESSION: No active cardiopulmonary disease.  Ct Head Wo Contrast  08/09/2013 IMPRESSION: Stable chronic left MCA distribution infarction. No acute intracranial abnormality.  Ct Angio Head and neck W/cm &/or Wo Cm  08/09/2013 IMPRESSION: Negative study. No evidence of atherosclerotic narrowing or dissection. No vessel occlusion. No aneurysm or vascular malformation.  MRI/MRA  08/10/2013 IMPRESSION: Chronic left parietal infarct. Chronic microvascular ischemic change. Extension of acute infarct into the left parietal operculum, left insula, and left inferior parietal lobe, surrounding the area of chronic infarction. Small irregular distal left vertebral artery. 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  Carotid Doppler  CTA neck done  2D Echocardiogram 08/10/13 EF 55-60%, no cardiac source of emboli identified  LUE doppler There is no DVT or SVT noted in the left upper extremity.  Transesophageal Echocardiogram 08/12/13 Impression:  1) No source of embolus  2) Negative bubble study  3) Normal EF 60%  4) No significant valve disease  5) No LAA thrombus  6) No aortic debris  7) Normal RV  8) No effusion  EKG 08/09/13 Sinus rhythm  RAE, consider biatrial enlargement  Anteroseptal infarct, old     Loop recorder - no afib  Component     Latest Ref Rng 08/11/2013  Anticardiolipin IgA     <22 APL U/mL 3 (L)  Anticardiolipin IgG     <23 GPL U/mL 2 (L)  Anticardiolipin IgM     <11 MPL U/mL 3 (L)  PTT Lupus Anticoagulant     28.0 - 43.0 secs 33.0  PTTLA Confirmation     <8.0 secs NOT APPL  PTTLA 4:1 Mix     28.0 - 43.0 secs NOT APPL  DRVVT     <42.9 secs 32.5  Drvvt confirmation     <1.15 Ratio NOT APPL  dRVVT Incubated 1:1 Mix     <42.9 secs NOT APPL  Lupus Anticoagulant     NOT DETECTED NOT DETECTED  Phosphatydalserine, IgG     <16 U/mL 4  Phosphatydalserine, IgM     <22 U/mL 7  Phosphatydalserine, IgA     <20 U/mL 5  Total Protein     6.0 - 8.3 g/dL 6.6  Albumin     3.5 - 5.2 g/dL 3.7  AST     0 - 37 U/L 14  ALT     0 - 35 U/L 15  Alkaline Phosphatase     39 - 117 U/L 59  Total Bilirubin     0.3 - 1.2 mg/dL 0.6  Bilirubin, Direct     0.0 - 0.3 mg/dL <1.6  Indirect Bilirubin     0.3 - 0.9 mg/dL NOT CALCULATED  c-ANCA Screen     NEGATIVE NEGATIVE  p-ANCA Screen     NEGATIVE NEGATIVE  Atypical p-ANCA Screen     NEGATIVE NEGATIVE  TSH     0.350 - 4.500 uIU/mL   Vitamin B-12     211 - 911 pg/mL 1016 (H)  Folate      >20.0  RPR     NON REAC NON REAC  HIV     NONREACTIVE NONREACTIVE  Sed Rate     0 - 22 mm/hr 3  CRP, High Sensitivity      1.3  C3 Complement     90 - 180 mg/dL 109  Complement C4, Body Fluid     10 - 40 mg/dL 28  ds DNA Ab      <1  Homocysteine     4.0 - 15.4 umol/L 6.5  SSA (Ro) (ENA) Antibody, IgG     <1.0 NEG AI <1.0 NEG  SSB (La) (ENA) Antibody, IgG     <1.0 NEG AI <1.0 NEG  Rheumatoid Factor     <=14 IU/mL <10    Assessment: As you may recall, Kara Mendoza is a 64 y.o. African American female with PMH of left MCA distribution infarct in 2008 with residual language impairment, hypertension, dyslipidemia, tobacco abuse, found to have again left parietal and frontal small embolic pattern strokes, appears like extension from  previous 2008 stroke. Felt like embolic, work up so far negative and hypercoag  labs were not remarkable. Consider cryptogenic stroke. Loop recorder so far negative for AF episodes. TCD emboli monitoring negative for MES. Talked with pt regarding RESPECT ESUS trial and Kara Mendoza refused to participate. Continue plavix and statin for now. Kara Mendoza does have left arm RSD or CRPS, and Kara Mendoza is going to follow up with [facility] neurology.  Plan:  - continue plavix and statin for stroke prevention - check BP and glucose at home - prescribed lidocaine cream again for pain relieve. - Follow up with your primary care physician for stroke risk factor modification. Recommend maintain blood pressure goal <130/80, diabetes with hemoglobin A1c goal below 6.5% and lipids with LDL cholesterol goal below 70 mg/dL.  - continue to follow up with [facility] for chronic regional pain syndrome (CRPS). - compliant with medication - quit smoking - follow up as needed.  I spent more than 25 minutes of face to face time with the patient. Greater than 50% of time was spent in counseling and coordination of care. We have discussed about the etiology, treatment option and prognosis of RSD or CRPS.    No orders of the defined types were placed in this encounter.   Meds ordered this encounter  Medications  . lidocaine (XYLOCAINE) 5 % ointment    Sig: Apply 1 application topically as needed.    Dispense:  50 g    Refill:  3    Patient Instructions  - continue plavix and statin for stroke prevention - check BP and glucose at home - prescribed lidocaine cream again for pain relieve. - Follow up with your primary care physician for stroke risk factor modification. Recommend maintain blood pressure goal <130/80, diabetes with hemoglobin A1c goal below 6.5% and lipids with LDL cholesterol goal below 70 mg/dL.  - continue to [facility] up with [facility] for chronic regional pain syndrome (CRPS). - compliant with medication - quit smoking - follow up as  needed.    [name], MD PhD [facility] [address] [place], [place] [address] [phone]

## 2014-12-24 ENCOUNTER — Encounter: Payer: Medicare (Managed Care) | Admitting: Internal Medicine

## 2014-12-24 DIAGNOSIS — R0989 Other specified symptoms and signs involving the circulatory and respiratory systems: Secondary | ICD-10-CM

## 2014-12-27 ENCOUNTER — Encounter: Payer: Self-pay | Admitting: Internal Medicine

## 2014-12-31 LAB — CUP PACEART REMOTE DEVICE CHECK: Date Time Interrogation Session: 20161030193735

## 2014-12-31 NOTE — Progress Notes (Signed)
Carelink summary report received. Battery status OK. Normal device function. No new symptom, brady, pause, or AF episodes. 1 tachy episode, likely not AF per SK, duration 5sec, peak V 194bpm, patient was under extreme stress at the time. Monthly summary reports and ROV with SK overdue (recall letter sent for 10/2014).

## 2015-01-04 ENCOUNTER — Ambulatory Visit (INDEPENDENT_AMBULATORY_CARE_PROVIDER_SITE_OTHER): Payer: Commercial Managed Care - HMO | Admitting: *Deleted

## 2015-01-04 DIAGNOSIS — I639 Cerebral infarction, unspecified: Secondary | ICD-10-CM

## 2015-01-04 NOTE — Progress Notes (Signed)
Carelink Summary Report / Loop Recorder 

## 2015-02-03 ENCOUNTER — Ambulatory Visit (INDEPENDENT_AMBULATORY_CARE_PROVIDER_SITE_OTHER): Payer: Commercial Managed Care - HMO | Admitting: *Deleted

## 2015-02-03 DIAGNOSIS — I639 Cerebral infarction, unspecified: Secondary | ICD-10-CM

## 2015-02-03 NOTE — Progress Notes (Signed)
Carelink Summary Report / Loop Recorder 

## 2015-02-17 DIAGNOSIS — E1140 Type 2 diabetes mellitus with diabetic neuropathy, unspecified: Secondary | ICD-10-CM | POA: Diagnosis not present

## 2015-02-17 DIAGNOSIS — G5612 Other lesions of median nerve, left upper limb: Secondary | ICD-10-CM | POA: Diagnosis not present

## 2015-02-17 DIAGNOSIS — Z8673 Personal history of transient ischemic attack (TIA), and cerebral infarction without residual deficits: Secondary | ICD-10-CM | POA: Diagnosis not present

## 2015-02-17 DIAGNOSIS — E0590 Thyrotoxicosis, unspecified without thyrotoxic crisis or storm: Secondary | ICD-10-CM | POA: Diagnosis not present

## 2015-02-17 DIAGNOSIS — Z0000 Encounter for general adult medical examination without abnormal findings: Secondary | ICD-10-CM | POA: Diagnosis not present

## 2015-02-17 DIAGNOSIS — Z95810 Presence of automatic (implantable) cardiac defibrillator: Secondary | ICD-10-CM | POA: Diagnosis not present

## 2015-02-28 DIAGNOSIS — Z1231 Encounter for screening mammogram for malignant neoplasm of breast: Secondary | ICD-10-CM | POA: Diagnosis not present

## 2015-03-01 DIAGNOSIS — I639 Cerebral infarction, unspecified: Secondary | ICD-10-CM | POA: Diagnosis not present

## 2015-03-01 DIAGNOSIS — E782 Mixed hyperlipidemia: Secondary | ICD-10-CM | POA: Diagnosis not present

## 2015-03-01 DIAGNOSIS — G5642 Causalgia of left upper limb: Secondary | ICD-10-CM | POA: Diagnosis not present

## 2015-03-01 DIAGNOSIS — Z131 Encounter for screening for diabetes mellitus: Secondary | ICD-10-CM | POA: Diagnosis not present

## 2015-03-03 DIAGNOSIS — E1141 Type 2 diabetes mellitus with diabetic mononeuropathy: Secondary | ICD-10-CM | POA: Diagnosis not present

## 2015-03-03 DIAGNOSIS — M797 Fibromyalgia: Secondary | ICD-10-CM | POA: Diagnosis not present

## 2015-03-03 DIAGNOSIS — F419 Anxiety disorder, unspecified: Secondary | ICD-10-CM | POA: Diagnosis not present

## 2015-03-03 DIAGNOSIS — F329 Major depressive disorder, single episode, unspecified: Secondary | ICD-10-CM | POA: Diagnosis not present

## 2015-03-03 DIAGNOSIS — G90512 Complex regional pain syndrome I of left upper limb: Secondary | ICD-10-CM | POA: Diagnosis not present

## 2015-03-03 DIAGNOSIS — I10 Essential (primary) hypertension: Secondary | ICD-10-CM | POA: Diagnosis not present

## 2015-03-03 DIAGNOSIS — R600 Localized edema: Secondary | ICD-10-CM | POA: Diagnosis not present

## 2015-03-03 DIAGNOSIS — R208 Other disturbances of skin sensation: Secondary | ICD-10-CM | POA: Diagnosis not present

## 2015-03-03 DIAGNOSIS — G5612 Other lesions of median nerve, left upper limb: Secondary | ICD-10-CM | POA: Diagnosis not present

## 2015-03-03 DIAGNOSIS — Z8673 Personal history of transient ischemic attack (TIA), and cerebral infarction without residual deficits: Secondary | ICD-10-CM | POA: Diagnosis not present

## 2015-03-06 DIAGNOSIS — I639 Cerebral infarction, unspecified: Secondary | ICD-10-CM | POA: Diagnosis not present

## 2015-03-07 ENCOUNTER — Ambulatory Visit (INDEPENDENT_AMBULATORY_CARE_PROVIDER_SITE_OTHER): Payer: Commercial Managed Care - HMO | Admitting: *Deleted

## 2015-03-07 DIAGNOSIS — I639 Cerebral infarction, unspecified: Secondary | ICD-10-CM

## 2015-03-07 NOTE — Progress Notes (Signed)
Carelink Summary Report / Loop Recorder 

## 2015-03-08 LAB — CUP PACEART REMOTE DEVICE CHECK: Date Time Interrogation Session: 20161129193752

## 2015-03-12 LAB — CUP PACEART REMOTE DEVICE CHECK: Date Time Interrogation Session: 20161229200526

## 2015-03-14 DIAGNOSIS — Z0000 Encounter for general adult medical examination without abnormal findings: Secondary | ICD-10-CM | POA: Diagnosis not present

## 2015-03-17 DIAGNOSIS — I10 Essential (primary) hypertension: Secondary | ICD-10-CM | POA: Diagnosis not present

## 2015-03-17 DIAGNOSIS — I639 Cerebral infarction, unspecified: Secondary | ICD-10-CM | POA: Diagnosis not present

## 2015-03-17 DIAGNOSIS — Z8673 Personal history of transient ischemic attack (TIA), and cerebral infarction without residual deficits: Secondary | ICD-10-CM | POA: Insufficient documentation

## 2015-03-20 DIAGNOSIS — I639 Cerebral infarction, unspecified: Secondary | ICD-10-CM | POA: Diagnosis not present

## 2015-03-21 DIAGNOSIS — I10 Essential (primary) hypertension: Secondary | ICD-10-CM | POA: Diagnosis not present

## 2015-03-21 DIAGNOSIS — I639 Cerebral infarction, unspecified: Secondary | ICD-10-CM | POA: Diagnosis not present

## 2015-03-21 DIAGNOSIS — Z9181 History of falling: Secondary | ICD-10-CM | POA: Diagnosis not present

## 2015-03-21 DIAGNOSIS — M797 Fibromyalgia: Secondary | ICD-10-CM | POA: Diagnosis not present

## 2015-03-21 DIAGNOSIS — E119 Type 2 diabetes mellitus without complications: Secondary | ICD-10-CM | POA: Diagnosis not present

## 2015-03-21 DIAGNOSIS — E785 Hyperlipidemia, unspecified: Secondary | ICD-10-CM | POA: Diagnosis not present

## 2015-03-21 DIAGNOSIS — I69393 Ataxia following cerebral infarction: Secondary | ICD-10-CM | POA: Diagnosis not present

## 2015-03-21 DIAGNOSIS — F17210 Nicotine dependence, cigarettes, uncomplicated: Secondary | ICD-10-CM | POA: Diagnosis not present

## 2015-03-21 DIAGNOSIS — R296 Repeated falls: Secondary | ICD-10-CM | POA: Diagnosis not present

## 2015-03-21 DIAGNOSIS — M6281 Muscle weakness (generalized): Secondary | ICD-10-CM | POA: Diagnosis not present

## 2015-03-22 DIAGNOSIS — I639 Cerebral infarction, unspecified: Secondary | ICD-10-CM | POA: Diagnosis not present

## 2015-03-23 DIAGNOSIS — Z9181 History of falling: Secondary | ICD-10-CM | POA: Diagnosis not present

## 2015-03-23 DIAGNOSIS — I639 Cerebral infarction, unspecified: Secondary | ICD-10-CM | POA: Diagnosis not present

## 2015-03-23 DIAGNOSIS — I10 Essential (primary) hypertension: Secondary | ICD-10-CM | POA: Diagnosis not present

## 2015-03-23 DIAGNOSIS — F17210 Nicotine dependence, cigarettes, uncomplicated: Secondary | ICD-10-CM | POA: Diagnosis not present

## 2015-03-23 DIAGNOSIS — I69393 Ataxia following cerebral infarction: Secondary | ICD-10-CM | POA: Diagnosis not present

## 2015-03-23 DIAGNOSIS — E785 Hyperlipidemia, unspecified: Secondary | ICD-10-CM | POA: Diagnosis not present

## 2015-03-23 DIAGNOSIS — M6281 Muscle weakness (generalized): Secondary | ICD-10-CM | POA: Diagnosis not present

## 2015-03-23 DIAGNOSIS — E119 Type 2 diabetes mellitus without complications: Secondary | ICD-10-CM | POA: Diagnosis not present

## 2015-03-23 DIAGNOSIS — M797 Fibromyalgia: Secondary | ICD-10-CM | POA: Diagnosis not present

## 2015-03-23 DIAGNOSIS — R296 Repeated falls: Secondary | ICD-10-CM | POA: Diagnosis not present

## 2015-03-24 DIAGNOSIS — I639 Cerebral infarction, unspecified: Secondary | ICD-10-CM | POA: Diagnosis not present

## 2015-03-25 DIAGNOSIS — I639 Cerebral infarction, unspecified: Secondary | ICD-10-CM | POA: Diagnosis not present

## 2015-03-26 DIAGNOSIS — I639 Cerebral infarction, unspecified: Secondary | ICD-10-CM | POA: Diagnosis not present

## 2015-03-27 DIAGNOSIS — I639 Cerebral infarction, unspecified: Secondary | ICD-10-CM | POA: Diagnosis not present

## 2015-03-28 DIAGNOSIS — R296 Repeated falls: Secondary | ICD-10-CM | POA: Diagnosis not present

## 2015-03-28 DIAGNOSIS — E785 Hyperlipidemia, unspecified: Secondary | ICD-10-CM | POA: Diagnosis not present

## 2015-03-28 DIAGNOSIS — Z9181 History of falling: Secondary | ICD-10-CM | POA: Diagnosis not present

## 2015-03-28 DIAGNOSIS — F17210 Nicotine dependence, cigarettes, uncomplicated: Secondary | ICD-10-CM | POA: Diagnosis not present

## 2015-03-28 DIAGNOSIS — I639 Cerebral infarction, unspecified: Secondary | ICD-10-CM | POA: Diagnosis not present

## 2015-03-28 DIAGNOSIS — M6281 Muscle weakness (generalized): Secondary | ICD-10-CM | POA: Diagnosis not present

## 2015-03-28 DIAGNOSIS — M797 Fibromyalgia: Secondary | ICD-10-CM | POA: Diagnosis not present

## 2015-03-28 DIAGNOSIS — E119 Type 2 diabetes mellitus without complications: Secondary | ICD-10-CM | POA: Diagnosis not present

## 2015-03-28 DIAGNOSIS — I69393 Ataxia following cerebral infarction: Secondary | ICD-10-CM | POA: Diagnosis not present

## 2015-03-28 DIAGNOSIS — I10 Essential (primary) hypertension: Secondary | ICD-10-CM | POA: Diagnosis not present

## 2015-03-29 DIAGNOSIS — I639 Cerebral infarction, unspecified: Secondary | ICD-10-CM | POA: Diagnosis not present

## 2015-03-30 DIAGNOSIS — E119 Type 2 diabetes mellitus without complications: Secondary | ICD-10-CM | POA: Diagnosis not present

## 2015-03-30 DIAGNOSIS — M6281 Muscle weakness (generalized): Secondary | ICD-10-CM | POA: Diagnosis not present

## 2015-03-30 DIAGNOSIS — F17210 Nicotine dependence, cigarettes, uncomplicated: Secondary | ICD-10-CM | POA: Diagnosis not present

## 2015-03-30 DIAGNOSIS — M797 Fibromyalgia: Secondary | ICD-10-CM | POA: Diagnosis not present

## 2015-03-30 DIAGNOSIS — I639 Cerebral infarction, unspecified: Secondary | ICD-10-CM | POA: Diagnosis not present

## 2015-03-30 DIAGNOSIS — E785 Hyperlipidemia, unspecified: Secondary | ICD-10-CM | POA: Diagnosis not present

## 2015-03-30 DIAGNOSIS — Z9181 History of falling: Secondary | ICD-10-CM | POA: Diagnosis not present

## 2015-03-30 DIAGNOSIS — I10 Essential (primary) hypertension: Secondary | ICD-10-CM | POA: Diagnosis not present

## 2015-03-30 DIAGNOSIS — R296 Repeated falls: Secondary | ICD-10-CM | POA: Diagnosis not present

## 2015-03-30 DIAGNOSIS — I69393 Ataxia following cerebral infarction: Secondary | ICD-10-CM | POA: Diagnosis not present

## 2015-03-31 DIAGNOSIS — I639 Cerebral infarction, unspecified: Secondary | ICD-10-CM | POA: Diagnosis not present

## 2015-04-01 DIAGNOSIS — E119 Type 2 diabetes mellitus without complications: Secondary | ICD-10-CM | POA: Diagnosis not present

## 2015-04-01 DIAGNOSIS — I69393 Ataxia following cerebral infarction: Secondary | ICD-10-CM | POA: Diagnosis not present

## 2015-04-01 DIAGNOSIS — I10 Essential (primary) hypertension: Secondary | ICD-10-CM | POA: Diagnosis not present

## 2015-04-01 DIAGNOSIS — I639 Cerebral infarction, unspecified: Secondary | ICD-10-CM | POA: Diagnosis not present

## 2015-04-01 DIAGNOSIS — R296 Repeated falls: Secondary | ICD-10-CM | POA: Diagnosis not present

## 2015-04-01 DIAGNOSIS — F17210 Nicotine dependence, cigarettes, uncomplicated: Secondary | ICD-10-CM | POA: Diagnosis not present

## 2015-04-01 DIAGNOSIS — M797 Fibromyalgia: Secondary | ICD-10-CM | POA: Diagnosis not present

## 2015-04-01 DIAGNOSIS — Z9181 History of falling: Secondary | ICD-10-CM | POA: Diagnosis not present

## 2015-04-01 DIAGNOSIS — E785 Hyperlipidemia, unspecified: Secondary | ICD-10-CM | POA: Diagnosis not present

## 2015-04-01 DIAGNOSIS — M6281 Muscle weakness (generalized): Secondary | ICD-10-CM | POA: Diagnosis not present

## 2015-04-02 DIAGNOSIS — I639 Cerebral infarction, unspecified: Secondary | ICD-10-CM | POA: Diagnosis not present

## 2015-04-03 DIAGNOSIS — I639 Cerebral infarction, unspecified: Secondary | ICD-10-CM | POA: Diagnosis not present

## 2015-04-04 ENCOUNTER — Ambulatory Visit (INDEPENDENT_AMBULATORY_CARE_PROVIDER_SITE_OTHER): Payer: Commercial Managed Care - HMO | Admitting: *Deleted

## 2015-04-04 DIAGNOSIS — E785 Hyperlipidemia, unspecified: Secondary | ICD-10-CM | POA: Diagnosis not present

## 2015-04-04 DIAGNOSIS — Z9181 History of falling: Secondary | ICD-10-CM | POA: Diagnosis not present

## 2015-04-04 DIAGNOSIS — I10 Essential (primary) hypertension: Secondary | ICD-10-CM | POA: Diagnosis not present

## 2015-04-04 DIAGNOSIS — E119 Type 2 diabetes mellitus without complications: Secondary | ICD-10-CM | POA: Diagnosis not present

## 2015-04-04 DIAGNOSIS — M797 Fibromyalgia: Secondary | ICD-10-CM | POA: Diagnosis not present

## 2015-04-04 DIAGNOSIS — M6281 Muscle weakness (generalized): Secondary | ICD-10-CM | POA: Diagnosis not present

## 2015-04-04 DIAGNOSIS — I639 Cerebral infarction, unspecified: Secondary | ICD-10-CM

## 2015-04-04 DIAGNOSIS — F17210 Nicotine dependence, cigarettes, uncomplicated: Secondary | ICD-10-CM | POA: Diagnosis not present

## 2015-04-04 DIAGNOSIS — R296 Repeated falls: Secondary | ICD-10-CM | POA: Diagnosis not present

## 2015-04-04 DIAGNOSIS — I69393 Ataxia following cerebral infarction: Secondary | ICD-10-CM | POA: Diagnosis not present

## 2015-04-05 DIAGNOSIS — H43393 Other vitreous opacities, bilateral: Secondary | ICD-10-CM | POA: Diagnosis not present

## 2015-04-05 DIAGNOSIS — E119 Type 2 diabetes mellitus without complications: Secondary | ICD-10-CM | POA: Diagnosis not present

## 2015-04-05 DIAGNOSIS — H269 Unspecified cataract: Secondary | ICD-10-CM | POA: Diagnosis not present

## 2015-04-05 NOTE — Progress Notes (Signed)
Carelink Summary Report / Loop Recorder 

## 2015-04-07 DIAGNOSIS — E119 Type 2 diabetes mellitus without complications: Secondary | ICD-10-CM | POA: Diagnosis not present

## 2015-04-07 DIAGNOSIS — M6281 Muscle weakness (generalized): Secondary | ICD-10-CM | POA: Diagnosis not present

## 2015-04-07 DIAGNOSIS — I10 Essential (primary) hypertension: Secondary | ICD-10-CM | POA: Diagnosis not present

## 2015-04-07 DIAGNOSIS — I69393 Ataxia following cerebral infarction: Secondary | ICD-10-CM | POA: Diagnosis not present

## 2015-04-08 DIAGNOSIS — F17210 Nicotine dependence, cigarettes, uncomplicated: Secondary | ICD-10-CM | POA: Diagnosis not present

## 2015-04-08 DIAGNOSIS — M797 Fibromyalgia: Secondary | ICD-10-CM | POA: Diagnosis not present

## 2015-04-08 DIAGNOSIS — M6281 Muscle weakness (generalized): Secondary | ICD-10-CM | POA: Diagnosis not present

## 2015-04-08 DIAGNOSIS — E785 Hyperlipidemia, unspecified: Secondary | ICD-10-CM | POA: Diagnosis not present

## 2015-04-08 DIAGNOSIS — I69393 Ataxia following cerebral infarction: Secondary | ICD-10-CM | POA: Diagnosis not present

## 2015-04-08 DIAGNOSIS — R296 Repeated falls: Secondary | ICD-10-CM | POA: Diagnosis not present

## 2015-04-08 DIAGNOSIS — E119 Type 2 diabetes mellitus without complications: Secondary | ICD-10-CM | POA: Diagnosis not present

## 2015-04-08 DIAGNOSIS — I10 Essential (primary) hypertension: Secondary | ICD-10-CM | POA: Diagnosis not present

## 2015-04-08 DIAGNOSIS — Z9181 History of falling: Secondary | ICD-10-CM | POA: Diagnosis not present

## 2015-04-11 DIAGNOSIS — E119 Type 2 diabetes mellitus without complications: Secondary | ICD-10-CM | POA: Diagnosis not present

## 2015-04-11 DIAGNOSIS — M797 Fibromyalgia: Secondary | ICD-10-CM | POA: Diagnosis not present

## 2015-04-11 DIAGNOSIS — I69393 Ataxia following cerebral infarction: Secondary | ICD-10-CM | POA: Diagnosis not present

## 2015-04-11 DIAGNOSIS — R296 Repeated falls: Secondary | ICD-10-CM | POA: Diagnosis not present

## 2015-04-11 DIAGNOSIS — I10 Essential (primary) hypertension: Secondary | ICD-10-CM | POA: Diagnosis not present

## 2015-04-11 DIAGNOSIS — Z9181 History of falling: Secondary | ICD-10-CM | POA: Diagnosis not present

## 2015-04-11 DIAGNOSIS — Z9889 Other specified postprocedural states: Secondary | ICD-10-CM | POA: Diagnosis not present

## 2015-04-11 DIAGNOSIS — I639 Cerebral infarction, unspecified: Secondary | ICD-10-CM | POA: Diagnosis not present

## 2015-04-11 DIAGNOSIS — F17210 Nicotine dependence, cigarettes, uncomplicated: Secondary | ICD-10-CM | POA: Diagnosis not present

## 2015-04-11 DIAGNOSIS — M6281 Muscle weakness (generalized): Secondary | ICD-10-CM | POA: Diagnosis not present

## 2015-04-11 DIAGNOSIS — G5642 Causalgia of left upper limb: Secondary | ICD-10-CM | POA: Diagnosis not present

## 2015-04-11 DIAGNOSIS — E785 Hyperlipidemia, unspecified: Secondary | ICD-10-CM | POA: Diagnosis not present

## 2015-04-11 DIAGNOSIS — G90512 Complex regional pain syndrome I of left upper limb: Secondary | ICD-10-CM | POA: Diagnosis not present

## 2015-04-12 DIAGNOSIS — I639 Cerebral infarction, unspecified: Secondary | ICD-10-CM | POA: Diagnosis not present

## 2015-04-12 LAB — CUP PACEART REMOTE DEVICE CHECK: Date Time Interrogation Session: 20170128200934

## 2015-04-12 NOTE — Progress Notes (Signed)
Carelink summary report received. Battery status OK. Normal device function. No new symptom episodes, tachy episodes, brady, or pause episodes. No new AF episodes. Monthly summary reports and ROV/PRN 

## 2015-04-13 DIAGNOSIS — I639 Cerebral infarction, unspecified: Secondary | ICD-10-CM | POA: Diagnosis not present

## 2015-04-14 DIAGNOSIS — M6281 Muscle weakness (generalized): Secondary | ICD-10-CM | POA: Diagnosis not present

## 2015-04-14 DIAGNOSIS — I10 Essential (primary) hypertension: Secondary | ICD-10-CM | POA: Diagnosis not present

## 2015-04-14 DIAGNOSIS — F17210 Nicotine dependence, cigarettes, uncomplicated: Secondary | ICD-10-CM | POA: Diagnosis not present

## 2015-04-14 DIAGNOSIS — E119 Type 2 diabetes mellitus without complications: Secondary | ICD-10-CM | POA: Diagnosis not present

## 2015-04-14 DIAGNOSIS — E785 Hyperlipidemia, unspecified: Secondary | ICD-10-CM | POA: Diagnosis not present

## 2015-04-14 DIAGNOSIS — Z9181 History of falling: Secondary | ICD-10-CM | POA: Diagnosis not present

## 2015-04-14 DIAGNOSIS — M797 Fibromyalgia: Secondary | ICD-10-CM | POA: Diagnosis not present

## 2015-04-14 DIAGNOSIS — R296 Repeated falls: Secondary | ICD-10-CM | POA: Diagnosis not present

## 2015-04-14 DIAGNOSIS — I69393 Ataxia following cerebral infarction: Secondary | ICD-10-CM | POA: Diagnosis not present

## 2015-04-14 DIAGNOSIS — I639 Cerebral infarction, unspecified: Secondary | ICD-10-CM | POA: Diagnosis not present

## 2015-04-15 DIAGNOSIS — I639 Cerebral infarction, unspecified: Secondary | ICD-10-CM | POA: Diagnosis not present

## 2015-04-16 DIAGNOSIS — I639 Cerebral infarction, unspecified: Secondary | ICD-10-CM | POA: Diagnosis not present

## 2015-04-17 DIAGNOSIS — I639 Cerebral infarction, unspecified: Secondary | ICD-10-CM | POA: Diagnosis not present

## 2015-04-18 DIAGNOSIS — I69393 Ataxia following cerebral infarction: Secondary | ICD-10-CM | POA: Diagnosis not present

## 2015-04-18 DIAGNOSIS — Z9181 History of falling: Secondary | ICD-10-CM | POA: Diagnosis not present

## 2015-04-18 DIAGNOSIS — R296 Repeated falls: Secondary | ICD-10-CM | POA: Diagnosis not present

## 2015-04-18 DIAGNOSIS — I10 Essential (primary) hypertension: Secondary | ICD-10-CM | POA: Diagnosis not present

## 2015-04-18 DIAGNOSIS — F17210 Nicotine dependence, cigarettes, uncomplicated: Secondary | ICD-10-CM | POA: Diagnosis not present

## 2015-04-18 DIAGNOSIS — M797 Fibromyalgia: Secondary | ICD-10-CM | POA: Diagnosis not present

## 2015-04-18 DIAGNOSIS — I639 Cerebral infarction, unspecified: Secondary | ICD-10-CM | POA: Diagnosis not present

## 2015-04-18 DIAGNOSIS — M6281 Muscle weakness (generalized): Secondary | ICD-10-CM | POA: Diagnosis not present

## 2015-04-18 DIAGNOSIS — E119 Type 2 diabetes mellitus without complications: Secondary | ICD-10-CM | POA: Diagnosis not present

## 2015-04-18 DIAGNOSIS — E785 Hyperlipidemia, unspecified: Secondary | ICD-10-CM | POA: Diagnosis not present

## 2015-04-18 LAB — CUP PACEART REMOTE DEVICE CHECK: Date Time Interrogation Session: 20170227203949

## 2015-04-19 DIAGNOSIS — E7800 Pure hypercholesterolemia, unspecified: Secondary | ICD-10-CM | POA: Diagnosis not present

## 2015-04-19 DIAGNOSIS — E0520 Thyrotoxicosis with toxic multinodular goiter without thyrotoxic crisis or storm: Secondary | ICD-10-CM | POA: Diagnosis not present

## 2015-04-19 DIAGNOSIS — E0511 Thyrotoxicosis with toxic single thyroid nodule with thyrotoxic crisis or storm: Secondary | ICD-10-CM | POA: Diagnosis not present

## 2015-04-19 DIAGNOSIS — Z7984 Long term (current) use of oral hypoglycemic drugs: Secondary | ICD-10-CM | POA: Diagnosis not present

## 2015-04-19 DIAGNOSIS — R7309 Other abnormal glucose: Secondary | ICD-10-CM | POA: Diagnosis not present

## 2015-04-19 DIAGNOSIS — I10 Essential (primary) hypertension: Secondary | ICD-10-CM | POA: Diagnosis not present

## 2015-04-19 DIAGNOSIS — I639 Cerebral infarction, unspecified: Secondary | ICD-10-CM | POA: Diagnosis not present

## 2015-04-19 DIAGNOSIS — E119 Type 2 diabetes mellitus without complications: Secondary | ICD-10-CM | POA: Diagnosis not present

## 2015-04-19 DIAGNOSIS — M797 Fibromyalgia: Secondary | ICD-10-CM | POA: Diagnosis not present

## 2015-04-19 DIAGNOSIS — Z8673 Personal history of transient ischemic attack (TIA), and cerebral infarction without residual deficits: Secondary | ICD-10-CM | POA: Diagnosis not present

## 2015-04-19 DIAGNOSIS — E042 Nontoxic multinodular goiter: Secondary | ICD-10-CM | POA: Diagnosis not present

## 2015-04-19 DIAGNOSIS — E0590 Thyrotoxicosis, unspecified without thyrotoxic crisis or storm: Secondary | ICD-10-CM | POA: Diagnosis not present

## 2015-04-19 DIAGNOSIS — E1136 Type 2 diabetes mellitus with diabetic cataract: Secondary | ICD-10-CM | POA: Diagnosis not present

## 2015-04-20 DIAGNOSIS — F17210 Nicotine dependence, cigarettes, uncomplicated: Secondary | ICD-10-CM | POA: Diagnosis not present

## 2015-04-20 DIAGNOSIS — E119 Type 2 diabetes mellitus without complications: Secondary | ICD-10-CM | POA: Diagnosis not present

## 2015-04-20 DIAGNOSIS — I10 Essential (primary) hypertension: Secondary | ICD-10-CM | POA: Diagnosis not present

## 2015-04-20 DIAGNOSIS — I639 Cerebral infarction, unspecified: Secondary | ICD-10-CM | POA: Diagnosis not present

## 2015-04-20 DIAGNOSIS — M797 Fibromyalgia: Secondary | ICD-10-CM | POA: Diagnosis not present

## 2015-04-20 DIAGNOSIS — E785 Hyperlipidemia, unspecified: Secondary | ICD-10-CM | POA: Diagnosis not present

## 2015-04-20 DIAGNOSIS — M6281 Muscle weakness (generalized): Secondary | ICD-10-CM | POA: Diagnosis not present

## 2015-04-20 DIAGNOSIS — R296 Repeated falls: Secondary | ICD-10-CM | POA: Diagnosis not present

## 2015-04-20 DIAGNOSIS — I69393 Ataxia following cerebral infarction: Secondary | ICD-10-CM | POA: Diagnosis not present

## 2015-04-20 DIAGNOSIS — Z9181 History of falling: Secondary | ICD-10-CM | POA: Diagnosis not present

## 2015-04-21 DIAGNOSIS — I639 Cerebral infarction, unspecified: Secondary | ICD-10-CM | POA: Diagnosis not present

## 2015-04-21 DIAGNOSIS — G5612 Other lesions of median nerve, left upper limb: Secondary | ICD-10-CM | POA: Diagnosis not present

## 2015-04-21 DIAGNOSIS — M5030 Other cervical disc degeneration, unspecified cervical region: Secondary | ICD-10-CM | POA: Diagnosis not present

## 2015-04-22 DIAGNOSIS — I639 Cerebral infarction, unspecified: Secondary | ICD-10-CM | POA: Diagnosis not present

## 2015-04-23 DIAGNOSIS — I639 Cerebral infarction, unspecified: Secondary | ICD-10-CM | POA: Diagnosis not present

## 2015-04-24 DIAGNOSIS — I639 Cerebral infarction, unspecified: Secondary | ICD-10-CM | POA: Diagnosis not present

## 2015-04-25 DIAGNOSIS — I639 Cerebral infarction, unspecified: Secondary | ICD-10-CM | POA: Diagnosis not present

## 2015-04-26 DIAGNOSIS — I639 Cerebral infarction, unspecified: Secondary | ICD-10-CM | POA: Diagnosis not present

## 2015-04-27 DIAGNOSIS — I639 Cerebral infarction, unspecified: Secondary | ICD-10-CM | POA: Diagnosis not present

## 2015-04-28 DIAGNOSIS — I639 Cerebral infarction, unspecified: Secondary | ICD-10-CM | POA: Diagnosis not present

## 2015-04-29 DIAGNOSIS — I639 Cerebral infarction, unspecified: Secondary | ICD-10-CM | POA: Diagnosis not present

## 2015-04-30 DIAGNOSIS — I639 Cerebral infarction, unspecified: Secondary | ICD-10-CM | POA: Diagnosis not present

## 2015-05-01 DIAGNOSIS — I639 Cerebral infarction, unspecified: Secondary | ICD-10-CM | POA: Diagnosis not present

## 2015-05-02 DIAGNOSIS — I639 Cerebral infarction, unspecified: Secondary | ICD-10-CM | POA: Diagnosis not present

## 2015-05-03 DIAGNOSIS — I639 Cerebral infarction, unspecified: Secondary | ICD-10-CM | POA: Diagnosis not present

## 2015-05-04 ENCOUNTER — Ambulatory Visit (INDEPENDENT_AMBULATORY_CARE_PROVIDER_SITE_OTHER): Payer: Commercial Managed Care - HMO | Admitting: *Deleted

## 2015-05-04 DIAGNOSIS — I639 Cerebral infarction, unspecified: Secondary | ICD-10-CM | POA: Diagnosis not present

## 2015-05-05 DIAGNOSIS — I639 Cerebral infarction, unspecified: Secondary | ICD-10-CM | POA: Diagnosis not present

## 2015-05-05 NOTE — Progress Notes (Signed)
Carelink Summary Report / Loop Recorder 

## 2015-05-06 DIAGNOSIS — I639 Cerebral infarction, unspecified: Secondary | ICD-10-CM | POA: Diagnosis not present

## 2015-05-07 DIAGNOSIS — I639 Cerebral infarction, unspecified: Secondary | ICD-10-CM | POA: Diagnosis not present

## 2015-05-08 DIAGNOSIS — I639 Cerebral infarction, unspecified: Secondary | ICD-10-CM | POA: Diagnosis not present

## 2015-05-09 DIAGNOSIS — I639 Cerebral infarction, unspecified: Secondary | ICD-10-CM | POA: Diagnosis not present

## 2015-05-10 DIAGNOSIS — I639 Cerebral infarction, unspecified: Secondary | ICD-10-CM | POA: Diagnosis not present

## 2015-05-11 DIAGNOSIS — I639 Cerebral infarction, unspecified: Secondary | ICD-10-CM | POA: Diagnosis not present

## 2015-05-12 DIAGNOSIS — I639 Cerebral infarction, unspecified: Secondary | ICD-10-CM | POA: Diagnosis not present

## 2015-05-13 DIAGNOSIS — I639 Cerebral infarction, unspecified: Secondary | ICD-10-CM | POA: Diagnosis not present

## 2015-05-14 DIAGNOSIS — I639 Cerebral infarction, unspecified: Secondary | ICD-10-CM | POA: Diagnosis not present

## 2015-05-15 DIAGNOSIS — I639 Cerebral infarction, unspecified: Secondary | ICD-10-CM | POA: Diagnosis not present

## 2015-05-16 DIAGNOSIS — I639 Cerebral infarction, unspecified: Secondary | ICD-10-CM | POA: Diagnosis not present

## 2015-05-17 DIAGNOSIS — I639 Cerebral infarction, unspecified: Secondary | ICD-10-CM | POA: Diagnosis not present

## 2015-05-17 DIAGNOSIS — M79602 Pain in left arm: Secondary | ICD-10-CM | POA: Diagnosis not present

## 2015-05-17 DIAGNOSIS — M79632 Pain in left forearm: Secondary | ICD-10-CM | POA: Diagnosis not present

## 2015-05-18 DIAGNOSIS — I639 Cerebral infarction, unspecified: Secondary | ICD-10-CM | POA: Diagnosis not present

## 2015-05-19 DIAGNOSIS — G5612 Other lesions of median nerve, left upper limb: Secondary | ICD-10-CM | POA: Diagnosis not present

## 2015-05-19 DIAGNOSIS — Z8673 Personal history of transient ischemic attack (TIA), and cerebral infarction without residual deficits: Secondary | ICD-10-CM | POA: Diagnosis not present

## 2015-05-19 DIAGNOSIS — I10 Essential (primary) hypertension: Secondary | ICD-10-CM | POA: Diagnosis not present

## 2015-05-19 DIAGNOSIS — I639 Cerebral infarction, unspecified: Secondary | ICD-10-CM | POA: Diagnosis not present

## 2015-05-20 DIAGNOSIS — I639 Cerebral infarction, unspecified: Secondary | ICD-10-CM | POA: Diagnosis not present

## 2015-05-21 DIAGNOSIS — I639 Cerebral infarction, unspecified: Secondary | ICD-10-CM | POA: Diagnosis not present

## 2015-05-22 DIAGNOSIS — I639 Cerebral infarction, unspecified: Secondary | ICD-10-CM | POA: Diagnosis not present

## 2015-05-23 DIAGNOSIS — I639 Cerebral infarction, unspecified: Secondary | ICD-10-CM | POA: Diagnosis not present

## 2015-05-24 DIAGNOSIS — I639 Cerebral infarction, unspecified: Secondary | ICD-10-CM | POA: Diagnosis not present

## 2015-05-25 DIAGNOSIS — I639 Cerebral infarction, unspecified: Secondary | ICD-10-CM | POA: Diagnosis not present

## 2015-05-26 DIAGNOSIS — I639 Cerebral infarction, unspecified: Secondary | ICD-10-CM | POA: Diagnosis not present

## 2015-05-27 DIAGNOSIS — I639 Cerebral infarction, unspecified: Secondary | ICD-10-CM | POA: Diagnosis not present

## 2015-05-28 DIAGNOSIS — I639 Cerebral infarction, unspecified: Secondary | ICD-10-CM | POA: Diagnosis not present

## 2015-05-29 DIAGNOSIS — I639 Cerebral infarction, unspecified: Secondary | ICD-10-CM | POA: Diagnosis not present

## 2015-05-30 DIAGNOSIS — I639 Cerebral infarction, unspecified: Secondary | ICD-10-CM | POA: Diagnosis not present

## 2015-05-31 DIAGNOSIS — I639 Cerebral infarction, unspecified: Secondary | ICD-10-CM | POA: Diagnosis not present

## 2015-06-01 DIAGNOSIS — I639 Cerebral infarction, unspecified: Secondary | ICD-10-CM | POA: Diagnosis not present

## 2015-06-02 DIAGNOSIS — I639 Cerebral infarction, unspecified: Secondary | ICD-10-CM | POA: Diagnosis not present

## 2015-06-03 ENCOUNTER — Ambulatory Visit (INDEPENDENT_AMBULATORY_CARE_PROVIDER_SITE_OTHER): Payer: Commercial Managed Care - HMO | Admitting: *Deleted

## 2015-06-03 DIAGNOSIS — I639 Cerebral infarction, unspecified: Secondary | ICD-10-CM

## 2015-06-04 ENCOUNTER — Encounter (HOSPITAL_COMMUNITY): Payer: Self-pay | Admitting: Emergency Medicine

## 2015-06-04 ENCOUNTER — Emergency Department (HOSPITAL_COMMUNITY)
Admission: EM | Admit: 2015-06-04 | Discharge: 2015-06-05 | Disposition: A | Discharge status: Home / Self Care | Payer: Commercial Managed Care - HMO | Attending: Emergency Medicine | Admitting: Emergency Medicine

## 2015-06-04 DIAGNOSIS — Z7902 Long term (current) use of antithrombotics/antiplatelets: Secondary | ICD-10-CM | POA: Insufficient documentation

## 2015-06-04 DIAGNOSIS — Z7984 Long term (current) use of oral hypoglycemic drugs: Secondary | ICD-10-CM | POA: Diagnosis not present

## 2015-06-04 DIAGNOSIS — M6281 Muscle weakness (generalized): Secondary | ICD-10-CM | POA: Diagnosis not present

## 2015-06-04 DIAGNOSIS — R110 Nausea: Secondary | ICD-10-CM | POA: Insufficient documentation

## 2015-06-04 DIAGNOSIS — R109 Unspecified abdominal pain: Secondary | ICD-10-CM | POA: Diagnosis not present

## 2015-06-04 DIAGNOSIS — I639 Cerebral infarction, unspecified: Secondary | ICD-10-CM | POA: Diagnosis not present

## 2015-06-04 DIAGNOSIS — G894 Chronic pain syndrome: Secondary | ICD-10-CM | POA: Insufficient documentation

## 2015-06-04 DIAGNOSIS — J45909 Unspecified asthma, uncomplicated: Secondary | ICD-10-CM | POA: Diagnosis not present

## 2015-06-04 DIAGNOSIS — K219 Gastro-esophageal reflux disease without esophagitis: Secondary | ICD-10-CM | POA: Insufficient documentation

## 2015-06-04 DIAGNOSIS — Z880 Allergy status to penicillin: Secondary | ICD-10-CM | POA: Insufficient documentation

## 2015-06-04 DIAGNOSIS — R51 Headache: Secondary | ICD-10-CM | POA: Insufficient documentation

## 2015-06-04 DIAGNOSIS — I10 Essential (primary) hypertension: Secondary | ICD-10-CM | POA: Diagnosis not present

## 2015-06-04 DIAGNOSIS — Z79899 Other long term (current) drug therapy: Secondary | ICD-10-CM | POA: Diagnosis not present

## 2015-06-04 DIAGNOSIS — F17210 Nicotine dependence, cigarettes, uncomplicated: Secondary | ICD-10-CM | POA: Insufficient documentation

## 2015-06-04 DIAGNOSIS — E785 Hyperlipidemia, unspecified: Secondary | ICD-10-CM | POA: Diagnosis not present

## 2015-06-04 DIAGNOSIS — F419 Anxiety disorder, unspecified: Secondary | ICD-10-CM | POA: Insufficient documentation

## 2015-06-04 DIAGNOSIS — R55 Syncope and collapse: Secondary | ICD-10-CM

## 2015-06-04 DIAGNOSIS — Z8673 Personal history of transient ischemic attack (TIA), and cerebral infarction without residual deficits: Secondary | ICD-10-CM | POA: Insufficient documentation

## 2015-06-04 DIAGNOSIS — R404 Transient alteration of awareness: Secondary | ICD-10-CM | POA: Diagnosis not present

## 2015-06-04 DIAGNOSIS — F331 Major depressive disorder, recurrent, moderate: Secondary | ICD-10-CM | POA: Insufficient documentation

## 2015-06-04 NOTE — ED Notes (Signed)
Pt presents from home with [facility] for near syncope; pt reports being at fair today with family and decreased PO intake and no meds today; pt also reports "odd feeling of nausea and diarrhea coming on" but denies vomiting or diarrhea; pt was orthostatic with EMS and 400cc NS IV administered by EMS; pt CAO x 4 at this time; residual LEFT sided weakness, LEFT sided facial droop and slurred speech from prior CVA

## 2015-06-04 NOTE — ED Provider Notes (Signed)
CSN: [id number]     Arrival date & time 06/04/15  2336 History  By signing my name below, I, Kara Mendoza, attest that this documentation has been prepared under the direction and in the presence of [name], MD. Electronically Signed: Doreatha MartinEva Mendoza, ED Scribe. 06/04/2015. 12:10 AM.     Chief Complaint  Patient presents with  . Near Syncope   Patient is a 65 y.o. female presenting with near-syncope. The history is provided by the patient. No language interpreter was used.  Near Syncope This is a new problem. The current episode started 1 to 2 hours ago. The problem occurs constantly. The problem has not changed since onset.Associated symptoms include abdominal pain and headaches. Pertinent negatives include no chest pain and no shortness of breath. Nothing aggravates the symptoms. Nothing relieves the symptoms. She has tried nothing for the symptoms. The treatment provided no relief.   HPI Comments: Kara HammockSheila L Mendoza is a 65 y.o. female who presents to the Emergency Department complaining of an episode of near-syncope that occurred this evening. Per pt, she was outside in the sun all day without food, fluid or medication. She reports that she began to feel generalized weakness, dizziness and lightheadedness before nearly passing out. Denies CP, abdominal pain or HA prior to episode. Pt states that her family members caught her before she hit the ground. Pt denies any injuries. Family denies any seizure activity. She states that she has otherwise felt generally well this week. She currently complains of frontal HA, mild nausea and abdominal pain. Denies sudden onset or sharp HA. Denies CP, SOB, emesis. Pt has existing left-sided weakness secondary to prior strokes, which has not changed this evening.    Past Medical History  Diagnosis Date  . Neuromuscular disorder (HCC)   . Hypertension   . Hyperlipidemia   . Asthma   . Diabetes mellitus (HCC)   . Hyperthyroidism   . Esophageal reflux   .  Constipation   . Major depressive disorder, recurrent episode, moderate (HCC)   . Anxiety state, unspecified   . History of fall   . Abnormality of gait   . Chronic pain syndrome   . Tobacco use disorder   . Osteoarthritis   . Dysarthria   . Acute bronchitis   . Chronic arm pain 2008    left  . Chronic leg pain 2008    left  . Fatigue   . Chronic headache   . Stroke (HCC)   . LOOP Recorder 10/14/2013  . Anxiety   . Memory loss   . Allergy    Past Surgical History  Procedure Laterality Date  . Cesarean section      x3  . Finger surgery    . Loop recorder implant  08-12-2013    MDT LINQ implanted by Dr Graciela HusbandsKlein for cryptogenic stroke  . Tee without cardioversion N/A 08/12/2013    Procedure: TRANSESOPHAGEAL ECHOCARDIOGRAM (TEE);  Surgeon: Wendall StadePeter C Nishan, MD;  [facility];  Service: Cardiovascular;  Laterality: N/A;  . Loop recorder implant N/A 08/12/2013    Procedure: LOOP RECORDER IMPLANT;  Surgeon: Duke SalviaSteven C Klein, MD;  [facility];  Service: Cardiovascular;  Laterality: N/A;   Family History  Problem Relation Age of Onset  . Diabetes Sister   . Cancer Father   . Diabetes Brother    Social History  Substance Use Topics  . Smoking status: Light Tobacco Smoker -- 0.20 packs/day for 15 years    Types: Cigarettes  . Smokeless  tobacco: Never Used     Comment: Tryin to quit, 07/08/14 smoking 3-4 a day  . Alcohol Use: No   OB History    No data available     Review of Systems  Respiratory: Negative for shortness of breath.   Cardiovascular: Positive for near-syncope. Negative for chest pain.  Gastrointestinal: Positive for nausea and abdominal pain. Negative for vomiting.  Neurological: Positive for dizziness (resolved), weakness (baseline, unchanged), light-headedness (resolved) and headaches. Negative for seizures and syncope.  All other systems reviewed and are negative.  Allergies  Diclofenac; Aspirin; and Penicillins  Home Medications   Prior to  Admission medications   Medication Sig Start Date End Date Taking? Authorizing Provider  albuterol (PROVENTIL HFA;VENTOLIN HFA) 108 (90 BASE) MCG/ACT inhaler Inhale 3 puffs into the lungs every 6 (six) hours as needed for wheezing or shortness of breath.     Historical Provider, MD  clopidogrel (PLAVIX) 75 MG tablet Take 1 tablet (75 mg total) by mouth daily. 12/10/14   Carmelina Dane, MD  DULoxetine (CYMBALTA) 20 MG capsule Take 1 capsule (20 mg total) by mouth daily. 12/10/14   Carmelina Dane, MD  Gabapentin Enacarbil (HORIZANT) 600 MG TBCR Take 600 mg by mouth 2 (two) times daily. 12/10/14   Carmelina Dane, MD  lidocaine (XYLOCAINE) 5 % ointment Apply 1 application topically as needed. 12/23/14   [name], MD  losartan-hydrochlorothiazide (HYZAAR) 100-25 MG per tablet Take 1 tablet by mouth daily. 08/21/13   Mcarthur Rossetti Angiulli, PA-C  metFORMIN (GLUCOPHAGE) 500 MG tablet Take 1 tablet (500 mg total) by mouth daily. 08/21/13   Mcarthur Rossetti Angiulli, PA-C  methimazole (TAPAZOLE) 5 MG tablet Take 1 tablet (5 mg total) by mouth daily. 08/21/13   Mcarthur Rossetti Angiulli, PA-C  Multiple Vitamins-Calcium (ONE-A-DAY WOMENS FORMULA PO) Take 1 tablet by mouth daily.    Historical Provider, MD  omeprazole (PRILOSEC) 20 MG capsule Take 1 capsule (20 mg total) by mouth 2 (two) times daily. 08/21/13   Mcarthur Rossetti Angiulli, PA-C  oxyCODONE-acetaminophen (PERCOCET) 10-325 MG per tablet Take 1 tablet by mouth every 8 (eight) hours as needed for pain.    Historical Provider, MD  pregabalin (LYRICA) 100 MG capsule Take 1 capsule (100 mg total) by mouth 3 (three) times daily. 12/10/14   Carmelina Dane, MD  sertraline (ZOLOFT) 50 MG tablet Take 1 tablet (50 mg total) by mouth at bedtime. 12/10/14   Carmelina Dane, MD  simvastatin (ZOCOR) 40 MG tablet Take 1 tablet (40 mg total) by mouth at bedtime. 08/21/13   Mcarthur Rossetti Angiulli, PA-C  zolpidem (AMBIEN) 5 MG tablet Take 1 tablet (5 mg total) by mouth at bedtime as needed  for sleep. 12/10/14   Carmelina Dane, MD   BP 117/71 mmHg  Pulse 83  Temp(Src) 97.8 F (36.6 C) (Oral)  Resp 18  Ht  (1.575 m)  Wt 188 lb (85.276 kg)  BMI 34.38 kg/m2  SpO2 99% Physical Exam CONSTITUTIONAL: Well developed/well nourished HEAD: Normocephalic/atraumatic EYES: EOMI/PERRL ENMT: Mucous membranes moist NECK: supple no meningeal signs SPINE/BACK:entire spine nontender CV: S1/S2 noted, no murmurs/rubs/gallops noted LUNGS: Lungs are clear to auscultation bilaterally, no apparent distress ABDOMEN: soft, nontender, no rebound or guarding, bowel sounds noted throughout abdomen GU:no cva tenderness NEURO: Pt is awake/alert/appropriate, LUE weakness noted (chronic) . Intermittent stuttering noted (chronic) EXTREMITIES: pulses normal/equal, full ROM SKIN: warm, color normal PSYCH: no abnormalities of mood noted, alert and oriented to situation   ED Course  Procedures  Medications  sodium chloride 0.9 % bolus 1,000 mL (0 mLs Intravenous Stopped 06/05/15 0159)  fentaNYL (SUBLIMAZE) injection 50 mcg (50 mcg Intravenous Given 06/05/15 0022)    DIAGNOSTIC STUDIES: Oxygen Saturation is 99% on RA, normal by my interpretation.    COORDINATION OF CARE: 12:07 AM Discussed treatment plan with pt at bedside which includes lab work and pt agreed to plan.   Pt felt improved with IV fluids She was awake/alert No new weakness (chronic weakness in left UE) She admits that she has had this intermittent HA since her previous stroke.  I doubt SAH/acute CVA at this time No cp/sob reported to suggest ACS/PE Suspect this is related to her not eating/drinking and at a carnival  She also admits to med compliance as she is all out of her meds  She did report dizziness with ambulation but she insists she is improved and wants to home She requests med refill for her meds until she sees her PCP  Labs Review Labs Reviewed  I-STAT CHEM 8, ED - Abnormal; Notable for the following:     Glucose, Bld 116 (*)    Hemoglobin 15.6 (*)    All other components within normal limits   I have personally reviewed and evaluated these lab results as part of my medical decision-making.   EKG Interpretation   Date/Time:  Sunday June 05 2015 00:29:34 EDT Ventricular Rate:  84 PR Interval:  157 QRS Duration: 79 QT Interval:  365 QTC Calculation: 431 R Axis:   37 Text Interpretation:  Sinus rhythm RAE, consider biatrial enlargement RSR'  in V1 or V2, probably normal variant No significant change since last  tracing Confirmed by Bebe Shaggy  MD, Marlen Koman ([id number]) on 06/05/2015 12:56:38 AM      MDM   Final diagnoses:  Near syncope    Nursing notes including past medical history and social history reviewed and considered in documentation Labs/vital reviewed myself and considered during evaluation   I personally performed the services described in this documentation, which was scribed in my presence. The recorded information has been reviewed and is accurate.       Zadie Rhine, MD 06/05/15 [phone]

## 2015-06-05 DIAGNOSIS — I639 Cerebral infarction, unspecified: Secondary | ICD-10-CM | POA: Diagnosis not present

## 2015-06-05 LAB — I-STAT CHEM 8, ED
BUN: 13 mg/dL (ref 6–20)
Calcium, Ion: 1.26 mmol/L (ref 1.13–1.30)
Chloride: 104 mmol/L (ref 101–111)
Creatinine, Ser: 0.9 mg/dL (ref 0.44–1.00)
Glucose, Bld: 116 mg/dL — ABNORMAL HIGH (ref 65–99)
HCT: 46 % (ref 36.0–46.0)
Hemoglobin: 15.6 g/dL — ABNORMAL HIGH (ref 12.0–15.0)
Potassium: 4.5 mmol/L (ref 3.5–5.1)
Sodium: 140 mmol/L (ref 135–145)
TCO2: 25 mmol/L (ref 0–100)

## 2015-06-05 MED ORDER — FENTANYL CITRATE (PF) 100 MCG/2ML IJ SOLN
50.0000 ug | Freq: Once | INTRAMUSCULAR | Status: AC
  Administered 2015-06-05: 50 ug via INTRAVENOUS
  Filled 2015-06-05: qty 2

## 2015-06-05 MED ORDER — SODIUM CHLORIDE 0.9 % IV BOLUS (SEPSIS)
1000.0000 mL | Freq: Once | INTRAVENOUS | Status: AC
  Administered 2015-06-05: 1000 mL via INTRAVENOUS

## 2015-06-05 MED ORDER — CLOPIDOGREL BISULFATE 75 MG PO TABS: 75.0000 mg | ORAL_TABLET | Freq: Every day | ORAL | Status: DC

## 2015-06-05 MED ORDER — LOSARTAN POTASSIUM-HCTZ 100-25 MG PO TABS: 1.0000 | ORAL_TABLET | Freq: Every day | ORAL | Status: DC

## 2015-06-05 MED ORDER — METFORMIN HCL 500 MG PO TABS
500.0000 mg | ORAL_TABLET | Freq: Every day | ORAL | Status: DC
Start: 2015-06-05 — End: 2015-11-29

## 2015-06-05 MED ORDER — DULOXETINE HCL 20 MG PO CPEP: 20.0000 mg | ORAL_CAPSULE | Freq: Every day | ORAL | Status: DC

## 2015-06-05 NOTE — ED Notes (Signed)
Pt verbalized understanding of d/c instructions and has no further questions. Pt stable and nAD 

## 2015-06-05 NOTE — ED Notes (Signed)
Ambulated pt at slow pace. Pt complained of dizziness throughout ambulation and upon return to bed had to lay down.

## 2015-06-05 NOTE — ED Notes (Signed)
PO fluids offered

## 2015-06-05 NOTE — Discharge Instructions (Signed)
Near-Syncope Near-syncope (commonly known as near fainting) is sudden weakness, dizziness, or feeling like you might pass out. This can happen when getting up or while standing for a long time. It is caused by a sudden decrease in blood flow to the brain, which can occur for various reasons. Most of the reasons are not serious.  HOME CARE Watch your condition for any changes.  Have someone stay with you until you feel stable.  If you feel like you are going to pass out:  Lie down right away.  Prop your feet up if you can.  Breathe deeply and steadily.  Move only when the feeling has gone away. Most of the time, this feeling lasts only a few minutes. You may feel tired for several hours.  Drink enough fluids to keep your pee (urine) clear or pale yellow.  If you are taking blood pressure or heart medicine, stand up slowly.  Follow up with your doctor as told. GET HELP RIGHT AWAY IF:   You have a severe headache.  You have unusual pain in the chest, belly (abdomen), or back.  You have bleeding from the mouth or butt (rectum), or you have black or tarry poop (stool).  You feel your heart beat differently than normal, or you have a very fast pulse.  You pass out, or you twitch and shake when you pass out.  You pass out when sitting or lying down.  You feel confused.  You have trouble walking.  You are weak.  You have vision problems. MAKE SURE YOU:   Understand these instructions.  Will watch your condition.  Will get help right away if you are not doing well or get worse.   This information is not intended to replace advice given to you by your health care provider. Make sure you discuss any questions you have with your health care provider.   Document Released: 07/11/2007 Document Revised: 02/12/2014 Document Reviewed: 06/27/2012 Elsevier Interactive Patient Education 2016 Elsevier Inc.  

## 2015-06-06 DIAGNOSIS — I639 Cerebral infarction, unspecified: Secondary | ICD-10-CM | POA: Diagnosis not present

## 2015-06-06 NOTE — Progress Notes (Signed)
Carelink Summary Report / Loop Recorder 

## 2015-06-07 DIAGNOSIS — I639 Cerebral infarction, unspecified: Secondary | ICD-10-CM | POA: Diagnosis not present

## 2015-06-08 DIAGNOSIS — I639 Cerebral infarction, unspecified: Secondary | ICD-10-CM | POA: Diagnosis not present

## 2015-06-09 DIAGNOSIS — I639 Cerebral infarction, unspecified: Secondary | ICD-10-CM | POA: Diagnosis not present

## 2015-06-10 DIAGNOSIS — I639 Cerebral infarction, unspecified: Secondary | ICD-10-CM | POA: Diagnosis not present

## 2015-06-11 DIAGNOSIS — I639 Cerebral infarction, unspecified: Secondary | ICD-10-CM | POA: Diagnosis not present

## 2015-06-12 DIAGNOSIS — I639 Cerebral infarction, unspecified: Secondary | ICD-10-CM | POA: Diagnosis not present

## 2015-06-13 DIAGNOSIS — I639 Cerebral infarction, unspecified: Secondary | ICD-10-CM | POA: Diagnosis not present

## 2015-06-14 DIAGNOSIS — I639 Cerebral infarction, unspecified: Secondary | ICD-10-CM | POA: Diagnosis not present

## 2015-06-15 DIAGNOSIS — I639 Cerebral infarction, unspecified: Secondary | ICD-10-CM | POA: Diagnosis not present

## 2015-06-16 DIAGNOSIS — I639 Cerebral infarction, unspecified: Secondary | ICD-10-CM | POA: Diagnosis not present

## 2015-06-17 DIAGNOSIS — I639 Cerebral infarction, unspecified: Secondary | ICD-10-CM | POA: Diagnosis not present

## 2015-06-18 DIAGNOSIS — I639 Cerebral infarction, unspecified: Secondary | ICD-10-CM | POA: Diagnosis not present

## 2015-06-19 DIAGNOSIS — I639 Cerebral infarction, unspecified: Secondary | ICD-10-CM | POA: Diagnosis not present

## 2015-07-01 ENCOUNTER — Telehealth: Payer: Self-pay | Admitting: Cardiology

## 2015-07-01 NOTE — Telephone Encounter (Signed)
Spoke w/ pt and requested that she send a manual transmission b/c her home monitor has not updated in at least 14 days.   

## 2015-07-02 LAB — CUP PACEART REMOTE DEVICE CHECK: Date Time Interrogation Session: 20170329203851

## 2015-07-02 NOTE — Progress Notes (Signed)
Carelink summary report received. Battery status OK. Normal device function. No new symptom episodes, tachy episodes, brady, or pause episodes. No new AF episodes. Monthly summary reports and ROV/PRN 

## 2015-07-05 ENCOUNTER — Ambulatory Visit (INDEPENDENT_AMBULATORY_CARE_PROVIDER_SITE_OTHER): Payer: Commercial Managed Care - HMO | Admitting: *Deleted

## 2015-07-05 DIAGNOSIS — I639 Cerebral infarction, unspecified: Secondary | ICD-10-CM

## 2015-07-06 NOTE — Progress Notes (Signed)
Carelink Summary Report / Loop Recorder 

## 2015-07-08 ENCOUNTER — Encounter: Payer: Self-pay | Admitting: Cardiology

## 2015-07-11 DIAGNOSIS — I639 Cerebral infarction, unspecified: Secondary | ICD-10-CM | POA: Diagnosis not present

## 2015-07-12 DIAGNOSIS — I639 Cerebral infarction, unspecified: Secondary | ICD-10-CM | POA: Diagnosis not present

## 2015-07-13 DIAGNOSIS — I639 Cerebral infarction, unspecified: Secondary | ICD-10-CM | POA: Diagnosis not present

## 2015-07-14 DIAGNOSIS — I639 Cerebral infarction, unspecified: Secondary | ICD-10-CM | POA: Diagnosis not present

## 2015-07-15 ENCOUNTER — Encounter: Payer: Self-pay | Admitting: Internal Medicine

## 2015-07-15 DIAGNOSIS — I639 Cerebral infarction, unspecified: Secondary | ICD-10-CM | POA: Diagnosis not present

## 2015-07-15 LAB — CUP PACEART REMOTE DEVICE CHECK: Date Time Interrogation Session: 20170428210843

## 2015-07-16 DIAGNOSIS — I639 Cerebral infarction, unspecified: Secondary | ICD-10-CM | POA: Diagnosis not present

## 2015-07-17 DIAGNOSIS — I639 Cerebral infarction, unspecified: Secondary | ICD-10-CM | POA: Diagnosis not present

## 2015-07-18 DIAGNOSIS — I639 Cerebral infarction, unspecified: Secondary | ICD-10-CM | POA: Diagnosis not present

## 2015-07-19 DIAGNOSIS — I639 Cerebral infarction, unspecified: Secondary | ICD-10-CM | POA: Diagnosis not present

## 2015-07-20 DIAGNOSIS — I639 Cerebral infarction, unspecified: Secondary | ICD-10-CM | POA: Diagnosis not present

## 2015-07-21 DIAGNOSIS — I639 Cerebral infarction, unspecified: Secondary | ICD-10-CM | POA: Diagnosis not present

## 2015-07-22 ENCOUNTER — Telehealth: Payer: Self-pay | Admitting: *Deleted

## 2015-07-22 DIAGNOSIS — I480 Paroxysmal atrial fibrillation: Secondary | ICD-10-CM | POA: Insufficient documentation

## 2015-07-22 DIAGNOSIS — I639 Cerebral infarction, unspecified: Secondary | ICD-10-CM | POA: Diagnosis not present

## 2015-07-22 NOTE — Telephone Encounter (Signed)
Called patient regarding new PAF on her LINQ.  Per Dr. Odessa FlemingKlein's recommendation, [facility]  Patient agreeable to appointment on 07/27/15 at 1:30pm.  Gave patient directions and phone number for AF Clinic.  She is appreciative of call and denies additional questions or concerns at this time.

## 2015-07-25 DIAGNOSIS — I639 Cerebral infarction, unspecified: Secondary | ICD-10-CM | POA: Diagnosis not present

## 2015-07-26 DIAGNOSIS — H35033 Hypertensive retinopathy, bilateral: Secondary | ICD-10-CM | POA: Diagnosis not present

## 2015-07-26 DIAGNOSIS — H5210 Myopia, unspecified eye: Secondary | ICD-10-CM | POA: Diagnosis not present

## 2015-07-26 DIAGNOSIS — H524 Presbyopia: Secondary | ICD-10-CM | POA: Diagnosis not present

## 2015-07-26 DIAGNOSIS — I639 Cerebral infarction, unspecified: Secondary | ICD-10-CM | POA: Diagnosis not present

## 2015-07-27 ENCOUNTER — Ambulatory Visit (HOSPITAL_COMMUNITY)
Admission: RE | Admit: 2015-07-27 | Discharge: 2015-07-27 | Disposition: A | Discharge status: Home / Self Care | Payer: Commercial Managed Care - HMO | Source: Ambulatory Visit | Attending: Nurse Practitioner | Admitting: Nurse Practitioner

## 2015-07-27 ENCOUNTER — Encounter (HOSPITAL_COMMUNITY): Payer: Self-pay | Admitting: Nurse Practitioner

## 2015-07-27 VITALS — BP 138/72 | HR 75 | Ht 62.0 in | Wt 181.6 lb

## 2015-07-27 DIAGNOSIS — K219 Gastro-esophageal reflux disease without esophagitis: Secondary | ICD-10-CM | POA: Diagnosis not present

## 2015-07-27 DIAGNOSIS — I10 Essential (primary) hypertension: Secondary | ICD-10-CM | POA: Insufficient documentation

## 2015-07-27 DIAGNOSIS — F419 Anxiety disorder, unspecified: Secondary | ICD-10-CM | POA: Diagnosis not present

## 2015-07-27 DIAGNOSIS — Z720 Tobacco use: Secondary | ICD-10-CM | POA: Insufficient documentation

## 2015-07-27 DIAGNOSIS — E785 Hyperlipidemia, unspecified: Secondary | ICD-10-CM | POA: Diagnosis not present

## 2015-07-27 DIAGNOSIS — F329 Major depressive disorder, single episode, unspecified: Secondary | ICD-10-CM | POA: Insufficient documentation

## 2015-07-27 DIAGNOSIS — Z7901 Long term (current) use of anticoagulants: Secondary | ICD-10-CM | POA: Diagnosis not present

## 2015-07-27 DIAGNOSIS — Z7984 Long term (current) use of oral hypoglycemic drugs: Secondary | ICD-10-CM | POA: Insufficient documentation

## 2015-07-27 DIAGNOSIS — I480 Paroxysmal atrial fibrillation: Secondary | ICD-10-CM

## 2015-07-27 DIAGNOSIS — I4891 Unspecified atrial fibrillation: Secondary | ICD-10-CM | POA: Diagnosis present

## 2015-07-27 DIAGNOSIS — R9431 Abnormal electrocardiogram [ECG] [EKG]: Secondary | ICD-10-CM | POA: Diagnosis not present

## 2015-07-27 DIAGNOSIS — F17210 Nicotine dependence, cigarettes, uncomplicated: Secondary | ICD-10-CM | POA: Diagnosis not present

## 2015-07-27 DIAGNOSIS — Z79899 Other long term (current) drug therapy: Secondary | ICD-10-CM | POA: Diagnosis not present

## 2015-07-27 DIAGNOSIS — E119 Type 2 diabetes mellitus without complications: Secondary | ICD-10-CM | POA: Insufficient documentation

## 2015-07-27 DIAGNOSIS — I639 Cerebral infarction, unspecified: Secondary | ICD-10-CM | POA: Diagnosis not present

## 2015-07-27 MED ORDER — RIVAROXABAN 20 MG PO TABS: 20.0000 mg | ORAL_TABLET | Freq: Every day | ORAL | Status: DC

## 2015-07-27 NOTE — Progress Notes (Signed)
Patient ID: Kara Mendoza, female   DOB: 12/14/1950, 64 y.o.   MRN: [id number]     Primary Care Physician: Burtis Junes, MD Referring Physician: Dr. Elmon Else is a 65 y.o. female with a h/o cyrtogenic stroke in 2015 at which time a LinQ device was implanted. Evidence for PAF non sustained was recently noted on device and Dr. Graciela Husbands suggested that she should be placed on DOAC. In the office, she is not aware of episodes of afib. She is off plavix, x one week, and on review of neurology note in fall of 2016, she had stopped drug for a period of time at that time. She still has residual speech problems from the stroke. She denies a bleeding history but is skeptical of "anything that will make me bleed" Steady on her feet, no falls. Does not use alcohol or NSAIDS.   Today, she denies symptoms of palpitations, chest pain, shortness of breath, orthopnea, PND, lower extremity edema, dizziness, presyncope, syncope, or neurologic sequela. The patient is tolerating medications without difficulties and is otherwise without complaint today.   Past Medical History  Diagnosis Date  . Neuromuscular disorder (HCC)   . Hypertension   . Hyperlipidemia   . Asthma   . Diabetes mellitus (HCC)   . Hyperthyroidism   . Esophageal reflux   . Constipation   . Major depressive disorder, recurrent episode, moderate (HCC)   . Anxiety state, unspecified   . History of fall   . Abnormality of gait   . Chronic pain syndrome   . Tobacco use disorder   . Osteoarthritis   . Dysarthria   . Acute bronchitis   . Chronic arm pain 2008    left  . Chronic leg pain 2008    left  . Fatigue   . Chronic headache   . Stroke (HCC)   . LOOP Recorder 10/14/2013  . Anxiety   . Memory loss   . Allergy    Past Surgical History  Procedure Laterality Date  . Cesarean section      x3  . Finger surgery    . Loop recorder implant  08-12-2013    MDT LINQ implanted by Dr Graciela Husbands for cryptogenic stroke  . Tee  without cardioversion N/A 08/12/2013    Procedure: TRANSESOPHAGEAL ECHOCARDIOGRAM (TEE);  Surgeon: Wendall Stade, MD;  Location: [facility] ENDOSCOPY;  Service: Cardiovascular;  Laterality: N/A;  . Loop recorder implant N/A 08/12/2013    Procedure: LOOP RECORDER IMPLANT;  Surgeon: Duke Salvia, MD;  Location: [facility] CATH LAB;  Service: Cardiovascular;  Laterality: N/A;    Current Outpatient Prescriptions  Medication Sig Dispense Refill  . albuterol (PROVENTIL HFA;VENTOLIN HFA) 108 (90 BASE) MCG/ACT inhaler Inhale 3 puffs into the lungs every 6 (six) hours as needed for wheezing or shortness of breath.     . DULoxetine (CYMBALTA) 20 MG capsule Take 1 capsule (20 mg total) by mouth daily. 14 capsule 0  . Gabapentin Enacarbil (HORIZANT) 600 MG TBCR Take 600 mg by mouth 2 (two) times daily. 60 tablet 2  . lidocaine (XYLOCAINE) 5 % ointment Apply 1 application topically as needed. (Patient taking differently: Apply 1 application topically daily as needed for mild pain. ) 50 g 3  . losartan-hydrochlorothiazide (HYZAAR) 100-25 MG tablet Take 1 tablet by mouth daily. 14 tablet 0  . metFORMIN (GLUCOPHAGE) 500 MG tablet Take 1 tablet (500 mg total) by mouth daily with breakfast. 14 tablet 0  . methimazole (TAPAZOLE) 5 MG  tablet Take 1 tablet (5 mg total) by mouth daily. 30 tablet 1  . Multiple Vitamins-Calcium (ONE-A-DAY WOMENS FORMULA PO) Take 1 tablet by mouth daily.    Marland Kitchen omeprazole (PRILOSEC) 20 MG capsule Take 1 capsule (20 mg total) by mouth 2 (two) times daily. 60 capsule 1  . sertraline (ZOLOFT) 50 MG tablet Take 1 tablet (50 mg total) by mouth at bedtime. 30 tablet 2  . zolpidem (AMBIEN) 5 MG tablet Take 1 tablet (5 mg total) by mouth at bedtime as needed for sleep. 30 tablet 2  . oxyCODONE-acetaminophen (PERCOCET) 10-325 MG per tablet Take 1 tablet by mouth every 8 (eight) hours as needed for pain. Reported on 07/27/2015    . pregabalin (LYRICA) 100 MG capsule Take 1 capsule (100 mg total) by mouth 3 (three)  times daily. (Patient not taking: Reported on 07/27/2015) 90 capsule 1  . rivaroxaban (XARELTO) 20 MG TABS tablet Take 1 tablet (20 mg total) by mouth daily with supper. 30 tablet 6   No current facility-administered medications for this encounter.    Allergies  Allergen Reactions  . Diclofenac Shortness Of Breath  . Aspirin Other (See Comments)    Possibly itching and "feeling little weird"  . Penicillins Hives    Social History   Social History  . Marital Status: Single    Spouse Name: N/A  . Number of Children: 3  . Years of Education: College   Occupational History  .  Other    Disabled   Social History Main Topics  . Smoking status: Light Tobacco Smoker -- 0.20 packs/day for 15 years    Types: Cigarettes  . Smokeless tobacco: Never Used     Comment: Tryin to quit, 07/08/14 smoking 3-4 a day  . Alcohol Use: No  . Drug Use: No  . Sexual Activity: Not on file   Other Topics Concern  . Not on file   Social History Narrative   Patient is single with 3 children.   Patient is right handed.   Patient has college education.   Patient drinks 2 cups daily.    Family History  Problem Relation Age of Onset  . Diabetes Sister   . Cancer Father   . Diabetes Brother     ROS- All systems are reviewed and negative except as per the HPI above  Physical Exam: Filed Vitals:   07/27/15 1320  BP: 138/72  Pulse: 75  Height:  (1.575 m)  Weight: 181 lb 9.6 oz (82.373 kg)    GEN- The patient is well appearing, alert and oriented x 3 today.   Head- normocephalic, atraumatic Eyes-  Sclera clear, conjunctiva pink Ears- hearing intact Oropharynx- clear Neck- supple, no JVP Lymph- no cervical lymphadenopathy Lungs- Clear to ausculation bilaterally, normal work of breathing Heart- Regular rate and rhythm, no murmurs, rubs or gallops, PMI not laterally displaced GI- soft, NT, ND, + BS Extremities- no clubbing, cyanosis, or edema MS- no significant deformity or  atrophy Skin- no rash or lesion Psych- euthymic mood, full affect Neuro- strength and sensation are intact  EKG-NSR at 75 bpm, pr int 138 ms, qrs int 78 ms, qtc 426 ms Epic records reviewed Linq reviewed  Assessment and Plan: 1. PAF Start xarelto 20 mg daily with largest meal, creat cl cal at 79.7 Stay off plavix Tried to educate pt that it is very important to take on a regular basis to prevent stroke Need to be diligent  re watching for s/s bleeding Will not  start rate control since low af burden and pt aymptomatic  F/u in one month sooner if needed  Lupita LeashDonna C. Matthew Folksarroll, ANP-C Afib Clinic [facility] [address] [place], [place] [address] [phone]

## 2015-07-27 NOTE — Patient Instructions (Signed)
Your physician has recommended you make the following change in your medication:  1)Start Xarelto 20mg  once a day with supper 2)Stop taking Plavix

## 2015-07-28 DIAGNOSIS — I639 Cerebral infarction, unspecified: Secondary | ICD-10-CM | POA: Diagnosis not present

## 2015-07-29 DIAGNOSIS — I639 Cerebral infarction, unspecified: Secondary | ICD-10-CM | POA: Diagnosis not present

## 2015-07-30 DIAGNOSIS — I639 Cerebral infarction, unspecified: Secondary | ICD-10-CM | POA: Diagnosis not present

## 2015-07-31 DIAGNOSIS — I639 Cerebral infarction, unspecified: Secondary | ICD-10-CM | POA: Diagnosis not present

## 2015-08-01 DIAGNOSIS — I639 Cerebral infarction, unspecified: Secondary | ICD-10-CM | POA: Diagnosis not present

## 2015-08-02 ENCOUNTER — Ambulatory Visit (INDEPENDENT_AMBULATORY_CARE_PROVIDER_SITE_OTHER): Payer: Commercial Managed Care - HMO | Admitting: *Deleted

## 2015-08-02 DIAGNOSIS — I639 Cerebral infarction, unspecified: Secondary | ICD-10-CM

## 2015-08-03 DIAGNOSIS — G5642 Causalgia of left upper limb: Secondary | ICD-10-CM | POA: Diagnosis not present

## 2015-08-03 DIAGNOSIS — I639 Cerebral infarction, unspecified: Secondary | ICD-10-CM | POA: Diagnosis not present

## 2015-08-03 DIAGNOSIS — I480 Paroxysmal atrial fibrillation: Secondary | ICD-10-CM | POA: Diagnosis not present

## 2015-08-03 DIAGNOSIS — M79621 Pain in right upper arm: Secondary | ICD-10-CM | POA: Diagnosis not present

## 2015-08-03 DIAGNOSIS — R609 Edema, unspecified: Secondary | ICD-10-CM | POA: Diagnosis not present

## 2015-08-03 DIAGNOSIS — E118 Type 2 diabetes mellitus with unspecified complications: Secondary | ICD-10-CM | POA: Diagnosis not present

## 2015-08-03 DIAGNOSIS — M79622 Pain in left upper arm: Secondary | ICD-10-CM | POA: Diagnosis not present

## 2015-08-03 NOTE — Progress Notes (Signed)
Carelink Summary Report / Loop Recorder 

## 2015-08-04 DIAGNOSIS — I639 Cerebral infarction, unspecified: Secondary | ICD-10-CM | POA: Diagnosis not present

## 2015-08-05 DIAGNOSIS — I639 Cerebral infarction, unspecified: Secondary | ICD-10-CM | POA: Diagnosis not present

## 2015-08-06 DIAGNOSIS — I639 Cerebral infarction, unspecified: Secondary | ICD-10-CM | POA: Diagnosis not present

## 2015-08-06 DIAGNOSIS — M79621 Pain in right upper arm: Secondary | ICD-10-CM | POA: Insufficient documentation

## 2015-08-07 DIAGNOSIS — I639 Cerebral infarction, unspecified: Secondary | ICD-10-CM | POA: Diagnosis not present

## 2015-08-08 DIAGNOSIS — I639 Cerebral infarction, unspecified: Secondary | ICD-10-CM | POA: Diagnosis not present

## 2015-08-09 DIAGNOSIS — I639 Cerebral infarction, unspecified: Secondary | ICD-10-CM | POA: Diagnosis not present

## 2015-08-10 DIAGNOSIS — M79622 Pain in left upper arm: Secondary | ICD-10-CM | POA: Diagnosis not present

## 2015-08-10 DIAGNOSIS — G8929 Other chronic pain: Secondary | ICD-10-CM | POA: Diagnosis not present

## 2015-08-10 DIAGNOSIS — M79621 Pain in right upper arm: Secondary | ICD-10-CM | POA: Diagnosis not present

## 2015-08-10 DIAGNOSIS — R2233 Localized swelling, mass and lump, upper limb, bilateral: Secondary | ICD-10-CM | POA: Diagnosis not present

## 2015-08-10 DIAGNOSIS — I517 Cardiomegaly: Secondary | ICD-10-CM | POA: Diagnosis not present

## 2015-08-10 DIAGNOSIS — R609 Edema, unspecified: Secondary | ICD-10-CM | POA: Diagnosis not present

## 2015-08-10 DIAGNOSIS — I639 Cerebral infarction, unspecified: Secondary | ICD-10-CM | POA: Diagnosis not present

## 2015-08-11 DIAGNOSIS — I639 Cerebral infarction, unspecified: Secondary | ICD-10-CM | POA: Diagnosis not present

## 2015-08-12 DIAGNOSIS — I639 Cerebral infarction, unspecified: Secondary | ICD-10-CM | POA: Diagnosis not present

## 2015-08-12 LAB — CUP PACEART REMOTE DEVICE CHECK: Date Time Interrogation Session: 20170528213626

## 2015-08-13 DIAGNOSIS — I639 Cerebral infarction, unspecified: Secondary | ICD-10-CM | POA: Diagnosis not present

## 2015-08-14 DIAGNOSIS — I639 Cerebral infarction, unspecified: Secondary | ICD-10-CM | POA: Diagnosis not present

## 2015-08-15 DIAGNOSIS — I639 Cerebral infarction, unspecified: Secondary | ICD-10-CM | POA: Diagnosis not present

## 2015-08-16 DIAGNOSIS — I639 Cerebral infarction, unspecified: Secondary | ICD-10-CM | POA: Diagnosis not present

## 2015-08-17 DIAGNOSIS — I639 Cerebral infarction, unspecified: Secondary | ICD-10-CM | POA: Diagnosis not present

## 2015-08-18 DIAGNOSIS — I639 Cerebral infarction, unspecified: Secondary | ICD-10-CM | POA: Diagnosis not present

## 2015-08-19 DIAGNOSIS — I639 Cerebral infarction, unspecified: Secondary | ICD-10-CM | POA: Diagnosis not present

## 2015-08-19 LAB — CUP PACEART REMOTE DEVICE CHECK: Date Time Interrogation Session: 20170627223659

## 2015-08-20 DIAGNOSIS — I639 Cerebral infarction, unspecified: Secondary | ICD-10-CM | POA: Diagnosis not present

## 2015-08-21 DIAGNOSIS — I639 Cerebral infarction, unspecified: Secondary | ICD-10-CM | POA: Diagnosis not present

## 2015-08-22 ENCOUNTER — Ambulatory Visit (HOSPITAL_COMMUNITY): Payer: Commercial Managed Care - HMO | Admitting: Nurse Practitioner

## 2015-08-22 DIAGNOSIS — G90512 Complex regional pain syndrome I of left upper limb: Secondary | ICD-10-CM | POA: Diagnosis not present

## 2015-08-22 DIAGNOSIS — I639 Cerebral infarction, unspecified: Secondary | ICD-10-CM | POA: Diagnosis not present

## 2015-08-23 DIAGNOSIS — I639 Cerebral infarction, unspecified: Secondary | ICD-10-CM | POA: Diagnosis not present

## 2015-08-24 DIAGNOSIS — I639 Cerebral infarction, unspecified: Secondary | ICD-10-CM | POA: Diagnosis not present

## 2015-08-25 ENCOUNTER — Encounter (HOSPITAL_COMMUNITY): Payer: Self-pay | Admitting: *Deleted

## 2015-08-25 ENCOUNTER — Inpatient Hospital Stay (HOSPITAL_COMMUNITY)
Admission: RE | Admit: 2015-08-25 | Payer: Commercial Managed Care - HMO | Source: Ambulatory Visit | Admitting: Nurse Practitioner

## 2015-08-25 DIAGNOSIS — I639 Cerebral infarction, unspecified: Secondary | ICD-10-CM | POA: Diagnosis not present

## 2015-08-26 DIAGNOSIS — I639 Cerebral infarction, unspecified: Secondary | ICD-10-CM | POA: Diagnosis not present

## 2015-08-27 DIAGNOSIS — I639 Cerebral infarction, unspecified: Secondary | ICD-10-CM | POA: Diagnosis not present

## 2015-08-28 DIAGNOSIS — I639 Cerebral infarction, unspecified: Secondary | ICD-10-CM | POA: Diagnosis not present

## 2015-08-29 DIAGNOSIS — I639 Cerebral infarction, unspecified: Secondary | ICD-10-CM | POA: Diagnosis not present

## 2015-08-30 DIAGNOSIS — I639 Cerebral infarction, unspecified: Secondary | ICD-10-CM | POA: Diagnosis not present

## 2015-08-31 DIAGNOSIS — I639 Cerebral infarction, unspecified: Secondary | ICD-10-CM | POA: Diagnosis not present

## 2015-09-01 ENCOUNTER — Ambulatory Visit (INDEPENDENT_AMBULATORY_CARE_PROVIDER_SITE_OTHER): Payer: Commercial Managed Care - HMO | Admitting: *Deleted

## 2015-09-01 DIAGNOSIS — I639 Cerebral infarction, unspecified: Secondary | ICD-10-CM | POA: Diagnosis not present

## 2015-09-02 DIAGNOSIS — Z8673 Personal history of transient ischemic attack (TIA), and cerebral infarction without residual deficits: Secondary | ICD-10-CM | POA: Diagnosis not present

## 2015-09-02 DIAGNOSIS — G5642 Causalgia of left upper limb: Secondary | ICD-10-CM | POA: Diagnosis not present

## 2015-09-02 DIAGNOSIS — G8929 Other chronic pain: Secondary | ICD-10-CM | POA: Diagnosis not present

## 2015-09-02 DIAGNOSIS — R51 Headache: Secondary | ICD-10-CM | POA: Diagnosis not present

## 2015-09-02 DIAGNOSIS — M25562 Pain in left knee: Secondary | ICD-10-CM | POA: Diagnosis not present

## 2015-09-02 DIAGNOSIS — I639 Cerebral infarction, unspecified: Secondary | ICD-10-CM | POA: Diagnosis not present

## 2015-09-02 NOTE — Progress Notes (Signed)
Carelink Summary Report / Loop Recorder 

## 2015-09-03 DIAGNOSIS — I639 Cerebral infarction, unspecified: Secondary | ICD-10-CM | POA: Diagnosis not present

## 2015-09-04 DIAGNOSIS — R519 Headache, unspecified: Secondary | ICD-10-CM | POA: Insufficient documentation

## 2015-09-04 DIAGNOSIS — G8929 Other chronic pain: Secondary | ICD-10-CM | POA: Insufficient documentation

## 2015-09-04 DIAGNOSIS — M25562 Pain in left knee: Secondary | ICD-10-CM

## 2015-09-04 DIAGNOSIS — I639 Cerebral infarction, unspecified: Secondary | ICD-10-CM | POA: Diagnosis not present

## 2015-09-04 DIAGNOSIS — R51 Headache: Secondary | ICD-10-CM

## 2015-09-05 DIAGNOSIS — I639 Cerebral infarction, unspecified: Secondary | ICD-10-CM | POA: Diagnosis not present

## 2015-09-06 DIAGNOSIS — I639 Cerebral infarction, unspecified: Secondary | ICD-10-CM | POA: Diagnosis not present

## 2015-09-07 DIAGNOSIS — I639 Cerebral infarction, unspecified: Secondary | ICD-10-CM | POA: Diagnosis not present

## 2015-09-08 DIAGNOSIS — I639 Cerebral infarction, unspecified: Secondary | ICD-10-CM | POA: Diagnosis not present

## 2015-09-09 DIAGNOSIS — I639 Cerebral infarction, unspecified: Secondary | ICD-10-CM | POA: Diagnosis not present

## 2015-09-10 DIAGNOSIS — I639 Cerebral infarction, unspecified: Secondary | ICD-10-CM | POA: Diagnosis not present

## 2015-09-11 DIAGNOSIS — I639 Cerebral infarction, unspecified: Secondary | ICD-10-CM | POA: Diagnosis not present

## 2015-09-12 DIAGNOSIS — I639 Cerebral infarction, unspecified: Secondary | ICD-10-CM | POA: Diagnosis not present

## 2015-09-13 DIAGNOSIS — I639 Cerebral infarction, unspecified: Secondary | ICD-10-CM | POA: Diagnosis not present

## 2015-09-14 DIAGNOSIS — I639 Cerebral infarction, unspecified: Secondary | ICD-10-CM | POA: Diagnosis not present

## 2015-09-15 DIAGNOSIS — I639 Cerebral infarction, unspecified: Secondary | ICD-10-CM | POA: Diagnosis not present

## 2015-09-16 DIAGNOSIS — I639 Cerebral infarction, unspecified: Secondary | ICD-10-CM | POA: Diagnosis not present

## 2015-09-17 DIAGNOSIS — I639 Cerebral infarction, unspecified: Secondary | ICD-10-CM | POA: Diagnosis not present

## 2015-09-18 DIAGNOSIS — I639 Cerebral infarction, unspecified: Secondary | ICD-10-CM | POA: Diagnosis not present

## 2015-09-23 ENCOUNTER — Other Ambulatory Visit: Payer: Self-pay | Admitting: Pharmacist

## 2015-09-23 NOTE — Patient Outreach (Addendum)
Outreach call to Marolyn HammockSheila L Holaway regarding her request for follow up from the [facility]. Called and spoke with patient. HIPAA identifiers verified and verbal consent received.  Ms. Tresa EndoKelly reports that she has been taking her losartan/hydrocholorothiazide, metformin and statin medication each once daily as directed. Patient states that she never misses any doses. Patient denies any barriers to adherence such as cost or side effects.  Patient reports that her gabapentin dose [facility]. Patient asks about side effects of gabapentin. Counsel patient about common side effects of gabapentin, including dizziness and fatigue. Patient has questions about how and why this was changed. Encourage patient to follow up with her provider at pain management for more information about this decision. Patient states that she will this provider.  Patient states that she has no further medication questions or concerns for me at this time.  Duanne MoronElisabeth Laysha Childers, PharmD Clinical Pharmacist [facility] [phone]

## 2015-10-02 LAB — CUP PACEART REMOTE DEVICE CHECK: Date Time Interrogation Session: 20170727230749

## 2015-10-03 ENCOUNTER — Ambulatory Visit (INDEPENDENT_AMBULATORY_CARE_PROVIDER_SITE_OTHER): Payer: Commercial Managed Care - HMO | Admitting: *Deleted

## 2015-10-03 DIAGNOSIS — I639 Cerebral infarction, unspecified: Secondary | ICD-10-CM

## 2015-10-04 NOTE — Progress Notes (Signed)
Carelink Summary Report / Loop Recorder 

## 2015-10-07 ENCOUNTER — Telehealth: Payer: Self-pay | Admitting: Cardiology

## 2015-10-07 NOTE — Telephone Encounter (Signed)
Spoke w/ pt about disconnected monitor. [place] and she will do it when she gets home and she is unsure of when she will be home at this time.

## 2015-10-14 ENCOUNTER — Encounter: Payer: Self-pay | Admitting: Cardiology

## 2015-10-29 LAB — CUP PACEART REMOTE DEVICE CHECK: Date Time Interrogation Session: 20170826231225

## 2015-10-29 NOTE — Progress Notes (Signed)
Carelink summary report received. Battery status OK. Normal device function. No new symptom episodes, tachy episodes, brady, or pause episodes. No new AF episodes. Monthly summary reports and ROV/PRN 

## 2015-10-31 ENCOUNTER — Encounter: Payer: Commercial Managed Care - HMO | Admitting: *Deleted

## 2015-11-16 DIAGNOSIS — I639 Cerebral infarction, unspecified: Secondary | ICD-10-CM | POA: Diagnosis not present

## 2015-11-17 DIAGNOSIS — I639 Cerebral infarction, unspecified: Secondary | ICD-10-CM | POA: Diagnosis not present

## 2015-11-18 DIAGNOSIS — I639 Cerebral infarction, unspecified: Secondary | ICD-10-CM | POA: Diagnosis not present

## 2015-11-19 DIAGNOSIS — I639 Cerebral infarction, unspecified: Secondary | ICD-10-CM | POA: Diagnosis not present

## 2015-11-20 DIAGNOSIS — I639 Cerebral infarction, unspecified: Secondary | ICD-10-CM | POA: Diagnosis not present

## 2015-11-21 DIAGNOSIS — I639 Cerebral infarction, unspecified: Secondary | ICD-10-CM | POA: Diagnosis not present

## 2015-11-22 DIAGNOSIS — I639 Cerebral infarction, unspecified: Secondary | ICD-10-CM | POA: Diagnosis not present

## 2015-11-23 DIAGNOSIS — I639 Cerebral infarction, unspecified: Secondary | ICD-10-CM | POA: Diagnosis not present

## 2015-11-24 DIAGNOSIS — I639 Cerebral infarction, unspecified: Secondary | ICD-10-CM | POA: Diagnosis not present

## 2015-11-25 DIAGNOSIS — I639 Cerebral infarction, unspecified: Secondary | ICD-10-CM | POA: Diagnosis not present

## 2015-11-26 DIAGNOSIS — I639 Cerebral infarction, unspecified: Secondary | ICD-10-CM | POA: Diagnosis not present

## 2015-11-27 DIAGNOSIS — I639 Cerebral infarction, unspecified: Secondary | ICD-10-CM | POA: Diagnosis not present

## 2015-11-28 DIAGNOSIS — I639 Cerebral infarction, unspecified: Secondary | ICD-10-CM | POA: Diagnosis not present

## 2015-11-29 ENCOUNTER — Other Ambulatory Visit (INDEPENDENT_AMBULATORY_CARE_PROVIDER_SITE_OTHER): Payer: Commercial Managed Care - HMO

## 2015-11-29 ENCOUNTER — Encounter: Payer: Self-pay | Admitting: Nurse Practitioner

## 2015-11-29 ENCOUNTER — Ambulatory Visit (INDEPENDENT_AMBULATORY_CARE_PROVIDER_SITE_OTHER): Payer: Commercial Managed Care - HMO | Admitting: Nurse Practitioner

## 2015-11-29 ENCOUNTER — Other Ambulatory Visit: Payer: Self-pay | Admitting: *Deleted

## 2015-11-29 ENCOUNTER — Encounter: Payer: Self-pay | Admitting: *Deleted

## 2015-11-29 VITALS — BP 140/78 | HR 90 | Temp 99.4°F | Ht 62.0 in | Wt 179.0 lb

## 2015-11-29 DIAGNOSIS — G9050 Complex regional pain syndrome I, unspecified: Secondary | ICD-10-CM

## 2015-11-29 DIAGNOSIS — F32A Depression, unspecified: Secondary | ICD-10-CM | POA: Insufficient documentation

## 2015-11-29 DIAGNOSIS — J45998 Other asthma: Secondary | ICD-10-CM | POA: Diagnosis not present

## 2015-11-29 DIAGNOSIS — E0590 Thyrotoxicosis, unspecified without thyrotoxic crisis or storm: Secondary | ICD-10-CM

## 2015-11-29 DIAGNOSIS — F329 Major depressive disorder, single episode, unspecified: Secondary | ICD-10-CM | POA: Insufficient documentation

## 2015-11-29 DIAGNOSIS — I10 Essential (primary) hypertension: Secondary | ICD-10-CM | POA: Diagnosis not present

## 2015-11-29 DIAGNOSIS — I639 Cerebral infarction, unspecified: Secondary | ICD-10-CM | POA: Diagnosis not present

## 2015-11-29 DIAGNOSIS — I82722 Chronic embolism and thrombosis of deep veins of left upper extremity: Secondary | ICD-10-CM | POA: Diagnosis not present

## 2015-11-29 DIAGNOSIS — E1140 Type 2 diabetes mellitus with diabetic neuropathy, unspecified: Secondary | ICD-10-CM

## 2015-11-29 DIAGNOSIS — E782 Mixed hyperlipidemia: Secondary | ICD-10-CM | POA: Diagnosis not present

## 2015-11-29 DIAGNOSIS — F339 Major depressive disorder, recurrent, unspecified: Secondary | ICD-10-CM

## 2015-11-29 DIAGNOSIS — K219 Gastro-esophageal reflux disease without esophagitis: Secondary | ICD-10-CM | POA: Diagnosis not present

## 2015-11-29 DIAGNOSIS — G5602 Carpal tunnel syndrome, left upper limb: Secondary | ICD-10-CM | POA: Insufficient documentation

## 2015-11-29 DIAGNOSIS — M5030 Other cervical disc degeneration, unspecified cervical region: Secondary | ICD-10-CM | POA: Insufficient documentation

## 2015-11-29 LAB — COMPREHENSIVE METABOLIC PANEL
ALT: 14 U/L (ref 0–35)
AST: 12 U/L (ref 0–37)
Albumin: 4.2 g/dL (ref 3.5–5.2)
Alkaline Phosphatase: 76 U/L (ref 39–117)
BUN: 10 mg/dL (ref 6–23)
CO2: 29 mEq/L (ref 19–32)
Calcium: 10.5 mg/dL (ref 8.4–10.5)
Chloride: 104 mEq/L (ref 96–112)
Creatinine, Ser: 0.73 mg/dL (ref 0.40–1.20)
GFR: 102.35 mL/min (ref >60.00)
Glucose, Bld: 100 mg/dL — ABNORMAL HIGH (ref 70–99)
Potassium: 4 mEq/L (ref 3.5–5.1)
Sodium: 138 mEq/L (ref 135–145)
Total Bilirubin: 0.4 mg/dL (ref 0.2–1.2)
Total Protein: 7.3 g/dL (ref 6.0–8.3)

## 2015-11-29 LAB — LIPID PANEL
Cholesterol: 225 mg/dL — ABNORMAL HIGH (ref 0–200)
HDL: 46.7 mg/dL (ref >39.00)
LDL Cholesterol: 154 mg/dL — ABNORMAL HIGH (ref 0–99)
NonHDL: 178.76
Total CHOL/HDL Ratio: 5
Triglycerides: 126 mg/dL (ref 0.0–149.0)
VLDL: 25.2 mg/dL (ref 0.0–40.0)

## 2015-11-29 LAB — HEMOGLOBIN A1C: Hgb A1c MFr Bld: 5.9 % (ref 4.6–6.5)

## 2015-11-29 MED ORDER — LOSARTAN POTASSIUM-HCTZ 100-25 MG PO TABS: 1.0000 | ORAL_TABLET | Freq: Every day | ORAL | 2 refills | Status: DC

## 2015-11-29 MED ORDER — ALBUTEROL SULFATE HFA 108 (90 BASE) MCG/ACT IN AERS: 2.0000 | INHALATION_SPRAY | Freq: Four times a day (QID) | RESPIRATORY_TRACT | 2 refills | Status: DC | PRN

## 2015-11-29 MED ORDER — ATORVASTATIN CALCIUM 40 MG PO TABS: 40.0000 mg | ORAL_TABLET | Freq: Every day | ORAL | 2 refills | Status: DC

## 2015-11-29 MED ORDER — SERTRALINE HCL 50 MG PO TABS: 50.0000 mg | ORAL_TABLET | Freq: Every day | ORAL | 2 refills | Status: DC

## 2015-11-29 MED ORDER — GABAPENTIN ENACARBIL ER 600 MG PO TBCR: 600.0000 mg | EXTENDED_RELEASE_TABLET | Freq: Two times a day (BID) | ORAL | 2 refills | Status: DC

## 2015-11-29 MED ORDER — METFORMIN HCL 500 MG PO TABS: 500.0000 mg | ORAL_TABLET | Freq: Every day | ORAL | 2 refills | Status: DC

## 2015-11-29 MED ORDER — DULOXETINE HCL 20 MG PO CPEP: 20.0000 mg | ORAL_CAPSULE | Freq: Every day | ORAL | 2 refills | Status: DC

## 2015-11-29 MED ORDER — OMEPRAZOLE 20 MG PO CPDR: 20.0000 mg | DELAYED_RELEASE_CAPSULE | Freq: Two times a day (BID) | ORAL | 1 refills | Status: DC

## 2015-11-29 NOTE — Progress Notes (Signed)
Pre visit review using our clinic review tool, if applicable. No additional management support is needed unless otherwise documented below in the visit note. 

## 2015-11-29 NOTE — Progress Notes (Signed)
Subjective:    Patient ID: Kara Mendoza, female    DOB: 10/26/1950, 65 y.o.   MRN: [id number]  Patient presents today for establish care (new patient) and Need medications refill  HPI Miss Booze denies any acute complaints today. She has been out of medications for over 2 weeks. She refuses to go back to [facility] for primary care, endocrinology, neurology, or pain management. She refuses to use Plavix or Xarelto due to fear of possible side effects.  Immunizations: (TDAP, Hep C screen, Pneumovax, Influenza, zoster)  Health Maintenance  Topic Date Due  .  Hepatitis C: One time screening is recommended by Center for Disease Control  (CDC) for  adults born from [birth year] through 1966.   December 15, 1950  . Complete foot exam   03/06/1959  . Eye exam for diabetics  06/26/2012  . DEXA scan (bone density measurement)  03/05/2014  . Pneumonia vaccines (1 of 2 - PCV13) 03/05/2014  . Mammogram  08/05/2015  . Flu Shot  05/05/2016*  . Hemoglobin A1C  05/29/2016  . Tetanus Vaccine  06/25/2018  . Colon Cancer Screening  12/31/2020  . Shingles Vaccine  Addressed  *Topic was postponed. The date shown is not the original due date.   Diet:None Weight: Stable Wt Readings from Last 3 Encounters:  11/30/15 178 lb (80.7 kg)  11/29/15 179 lb (81.2 kg)  07/27/15 181 lb 9.6 oz (82.4 kg)   Exercise:None Fall Risk: Fall Risk  12/23/2014 05/01/2012 04/30/2012 04/15/2012 03/27/2012 03/10/2012 01/28/2012  Falls in the past year? _0  Yes No  Number falls in past yr: 2 or more 2 or more 2 or more _1 or more -  Injury with Fall? - No Yes No No - -  Risk Factor Category  High Fall Risk High Fall Risk High Fall Risk - - - -  Risk for fall due to : History of fall(s);Impaired balance/gait History of fall(s);Impaired balance/gait;Impaired vision Impaired balance/gait Impaired balance/gait Impaired balance/gait;Other (Comment) History of fall(s);Impaired balance/gait Impaired balance/gait  Risk  for fall due to (comments): - - Uses a cane - left knee gives out - -   Home Safety:Home alone, has CNA who helps with ADLs daily. Depression/Suicide: Depression screen [facility] 2/9 12/10/2014 05/01/2012 04/30/2012 04/15/2012 03/27/2012 03/10/2012 01/28/2012  Decreased Interest 3 3 0 0 0 0 0  Down, Depressed, Hopeless 2 3 0 1 1 0 0  PHQ - 2 Score 5 6 0 1 1 0 0  Altered sleeping 2 0 - - - - -  Tired, decreased energy 3 3 - - - - -  Change in appetite 2 2 - - - - -  Feeling bad or failure about yourself  2 0 - - - - -  Trouble concentrating 2 3 - - - - -  Moving slowly or fidgety/restless 3 0 - - - - -  Suicidal thoughts 0 0 - - - - -  PHQ-9 Score 19 14 - - - - -   No flowsheet data found. Vision:Patient will schedule Dental: Needed Advanced Directive: Advanced Directives 06/04/2015  Does patient have an advance directive? No  Would patient like information on creating an advanced directive? -  Pre-existing out of facility DNR order (yellow form or pink MOST form) -    Medications and allergies reviewed with patient and updated if appropriate.  Patient Active Problem List   Diagnosis Date Noted  . Carpal tunnel syndrome on left 11/29/2015  . DDD (  degenerative disc disease), cervical 11/29/2015  . Depression 11/29/2015  . Chronic pain of left knee 09/04/2015  . Headache 09/04/2015  . Hypercalcemia 08/06/2015  . Pain in right upper arm 08/06/2015  . Paroxysmal atrial fibrillation ([place]) 07/22/2015  . History of loop recorder 04/11/2015  . History of CVA (cerebrovascular accident) 03/17/2015  . Shortness of breath 06/23/2014  . Complex regional pain syndrome type 2 of upper extremity 11/05/2013  . Carpal tunnel syndrome on right 10/22/2013  . Chronic embolism and thrombosis of deep vein of left upper extremity ([place]) 10/22/2013  . Left arm pain 10/22/2013  . Cardiac device in situ 10/14/2013  . Essential hypertension 09/30/2013  . RSD (reflex sympathetic dystrophy) 09/30/2013  . Diabetes  ([place]) 09/28/2013  . Hyperthyroidism 09/28/2013  . Median nerve neuritis 09/28/2013  . Ataxia, late effect of cerebrovascular disease 09/28/2013  . Spastic hemiplegia affecting dominant side ([place]) 09/28/2013  . CVA (cerebral infarction) 08/15/2013  . Cerebral embolism with cerebral infarction ([place]) 08/09/2013  . Dysarthria 08/09/2013  . Abnormality of gait 05/01/2012  . Incontinence of urine 05/01/2012  . Pneumonia 03/10/2012  . Tobacco abuse 01/28/2012  . Tobacco dependence syndrome 01/28/2012  . Fatigue 01/01/2011  . Weakness generalized 11/03/2010  . Headache(784.0) 09/01/2010  . Plantar fasciitis 09/01/2010  . Allergic rhinitis 07/11/2010  . Persistent cough 06/16/2010  . Asthma night-time symptoms 06/14/2010  . GERD (gastroesophageal reflux disease) 05/26/2010  . Stroke ([place]) 05/26/2010  . Hyperlipidemia 05/26/2010  . Osteoarthritis 05/26/2010  . Mononeuritis 05/26/2010    Current Outpatient Prescriptions on File Prior to Visit  Medication Sig Dispense Refill  . methimazole (TAPAZOLE) 5 MG tablet Take 1 tablet (5 mg total) by mouth daily. 30 tablet 1  . Multiple Vitamins-Calcium (ONE-A-DAY WOMENS FORMULA PO) Take 1 tablet by mouth daily.    Marland Kitchen oxyCODONE-acetaminophen (PERCOCET) 10-325 MG per tablet Take 1 tablet by mouth every 8 (eight) hours as needed for pain. Reported on 07/27/2015    . zolpidem (AMBIEN) 5 MG tablet Take 1 tablet (5 mg total) by mouth at bedtime as needed for sleep. 30 tablet 2   No current facility-administered medications on file prior to visit.     Past Medical History:  Diagnosis Date  . Abnormality of gait   . Acute bronchitis   . Allergy   . Anxiety   . Anxiety state, unspecified   . Asthma   . Chronic arm pain 2008   left  . Chronic headache   . Chronic leg pain 2008   left  . Chronic pain syndrome   . Constipation   . Diabetes mellitus ([place])   . Dysarthria   . Esophageal reflux   . Fatigue   . History of fall   . Hyperlipidemia     . Hypertension   . Hyperthyroidism   . LOOP Recorder 10/14/2013  . Major depressive disorder, recurrent episode, moderate ([place])   . Memory loss   . Neuromuscular disorder ([place])   . Osteoarthritis   . Stroke ([place])   . Tobacco use disorder     Past Surgical History:  Procedure Laterality Date  . CESAREAN SECTION     x3  . FINGER SURGERY    . LOOP RECORDER IMPLANT  08-12-2013   MDT LINQ implanted by Dr Caryl Comes for cryptogenic stroke  . LOOP RECORDER IMPLANT N/A 08/12/2013   Procedure: LOOP RECORDER IMPLANT;  Surgeon: Deboraha Sprang, MD;  Location: [facility] CATH LAB;  Service: Cardiovascular;  Laterality: N/A;  . TEE WITHOUT  CARDIOVERSION N/A 08/12/2013   Procedure: TRANSESOPHAGEAL ECHOCARDIOGRAM (TEE);  Surgeon: Josue Hector, MD;  Location: [facility] ENDOSCOPY;  Service: Cardiovascular;  Laterality: N/A;    Social History   Social History  . Marital status: Single    Spouse name: N/A  . Number of children: 3  . Years of education: College   Occupational History  .  Other    Disabled   Social History Main Topics  . Smoking status: Light Tobacco Smoker    Packs/day: 0.20    Years: 15.00    Types: Cigarettes  . Smokeless tobacco: Never Used     Comment: Tryin to quit, 07/08/14 smoking 3-4 a day  . Alcohol use No  . Drug use: No  . Sexual activity: Not Asked   Other Topics Concern  . None   Social History Narrative   Patient is single with 3 children.   Patient is right handed.   Patient has college education.   Patient drinks 2 cups daily.    Family History  Problem Relation Age of Onset  . Cancer Father   . Diabetes Sister   . Diabetes Brother         Review of Systems  Constitutional: Negative for fever, malaise/fatigue and weight loss.  HENT: Negative for congestion and sore throat.   Eyes:       Negative for visual changes  Respiratory: Negative for cough and shortness of breath.   Cardiovascular: Negative for chest pain, palpitations and leg swelling.   Gastrointestinal: Negative for blood in stool, constipation, diarrhea and heartburn.  Genitourinary: Negative for dysuria, frequency and urgency.  Musculoskeletal: Positive for joint pain. Negative for falls and myalgias.  Skin: Negative for rash.  Neurological: Positive for focal weakness. Negative for dizziness, tingling, sensory change and headaches.       Left arm weakness post CVA  Endo/Heme/Allergies: Does not bruise/bleed easily.  Psychiatric/Behavioral: Positive for depression. Negative for substance abuse and suicidal ideas. The patient is not nervous/anxious.     Objective:   Vitals:   11/29/15 1508  BP: 140/78  Pulse: 90  Temp: 99.4 F (37.4 C)    Body mass index is 32.74 kg/m.   Physical Examination:  Physical Exam  Constitutional: She is oriented to person, place, and time and well-developed, well-nourished, and in no distress. No distress.  HENT:  Right Ear: External ear normal.  Left Ear: External ear normal.  Nose: Nose normal.  Mouth/Throat: Oropharynx is clear and moist. No oropharyngeal exudate.  Eyes: Conjunctivae and EOM are normal. Pupils are equal, round, and reactive to light. No scleral icterus.  Neck: Normal range of motion. Neck supple. No thyromegaly present.  Cardiovascular: Normal rate, normal heart sounds and intact distal pulses.   Pulmonary/Chest: Effort normal and breath sounds normal. She exhibits no tenderness.  Abdominal: Soft. Bowel sounds are normal. She exhibits no distension. There is no tenderness.  Musculoskeletal: Normal range of motion. She exhibits edema. She exhibits no tenderness.  Lymphadenopathy:    She has no cervical adenopathy.  Neurological: She is alert and oriented to person, place, and time. She is not disoriented. She displays abnormal speech. No sensory deficit. Gait abnormal.  Stuttering speech. Left arm weakness.  Normal distal pulses. Normal and equal  microfilament and vibration sensation. Skin intact. No  erythema. Mild nonpitting ankle edema. Callus on both feet. Hypertrophic toenails   Skin: Skin is warm and dry.  Psychiatric: Affect and judgment normal.  Vitals reviewed.   ASSESSMENT and PLAN:  Braylinn was seen today for annual exam.  Diagnoses and all orders for this visit:  Essential hypertension -     losartan-hydrochlorothiazide (HYZAAR) 100-25 MG tablet; Take 1 tablet by mouth daily. -     Comp Met (CMET); Future -     Ambulatory referral to Cardiology  Chronic embolism and thrombosis of deep vein of left upper extremity ([place]) -     Ambulatory referral to Cardiology  Gastroesophageal reflux disease, esophagitis presence not specified -     omeprazole (PRILOSEC) 20 MG capsule; Take 1 capsule (20 mg total) by mouth 2 (two) times daily.  Type 2 diabetes mellitus with diabetic neuropathy, without long-term current use of insulin (HCC) -     Discontinue: DULoxetine (CYMBALTA) 20 MG capsule; Take 1 capsule (20 mg total) by mouth daily. -     metFORMIN (GLUCOPHAGE) 500 MG tablet; Take 1 tablet (500 mg total) by mouth daily with breakfast. -     Gabapentin Enacarbil (HORIZANT) 600 MG TBCR; Take 600 mg by mouth 2 (two) times daily. -     Comp Met (CMET); Future -     Hemoglobin A1c; Future -     Ambulatory referral to Pain Clinic -     DULoxetine (CYMBALTA) 20 MG capsule; Take 1 capsule (20 mg total) by mouth daily.  Hyperthyroidism -     Ambulatory referral to Endocrinology  Spastic hemiplegia affecting dominant side (HCC)  RSD (reflex sympathetic dystrophy) -     Ambulatory referral to Pain Clinic  Mixed hyperlipidemia -     Lipid panel; Future -     atorvastatin (LIPITOR) 40 MG tablet; Take 1 tablet (40 mg total) by mouth daily at 6 PM.  Asthma night-time symptoms -     albuterol (PROVENTIL HFA;VENTOLIN HFA) 108 (90 Base) MCG/ACT inhaler; Inhale 2 puffs into the lungs every 6 (six) hours as needed for wheezing or shortness of breath.  Recurrent major depressive  disorder, remission status unspecified (HCC) -     sertraline (ZOLOFT) 50 MG tablet; Take 1 tablet (50 mg total) by mouth at bedtime.  Paroxysmal atrial fibrillation (HCC) -     Ambulatory referral to Cardiology  Cardiac device in situ -     Ambulatory referral to Cardiology  Cerebral infarction due to embolism of precerebral artery ([place])   Cerebral embolism with cerebral infarction Patient declined use of plavix and xarelto due to risk of bleeding.  Chronic embolism and thrombosis of deep vein of left upper extremity (HCC) Patient declined use of plavix and xarelto due to risk of bleeding  Hyperlipidemia She declined use of any cholesterol lowering medication.  she opted to use diet and life style changes. Referral to nutritionist made per patient's request.    Recent Results (from the past 2160 hour(s))  Implantable device - remote     Status: None   Collection Time: 09/01/15 11:07 PM  Result Value Ref Range   Date Time Interrogation Session [id number]    Pulse Generator Manufacturer MERM    Pulse Gen Model [id number] Reveal LINQ    Pulse Gen Serial Number [id number]    Implantable Pulse Generator Type ICM/ILR    Implantable Pulse Generator Implant Date [id number]    Eval Rhythm SR   Implantable device - remote     Status: None   Collection Time: 10/03/15 11:12 PM  Result Value Ref Range   Date Time Interrogation Session [phone]    Pulse Generator Manufacturer MERM    Pulse Gen Model [id number] Reveal  LINQ    Pulse Gen Serial Number [id number]    Implantable Pulse Generator Type ICM/ILR    Implantable Pulse Generator Implant Date [id number]    Eval Rhythm SR   Comp Met (CMET)     Status: Abnormal   Collection Time: 11/29/15  4:37 PM  Result Value Ref Range   Sodium 138 135 - 145 mEq/L   Potassium 4.0 3.5 - 5.1 mEq/L   Chloride 104 96 - 112 mEq/L   CO2 29 19 - 32 mEq/L   Glucose, Bld 100 (H) 70 - 99 mg/dL   BUN 10 6 - 23 mg/dL   Creatinine,  Ser 0.73 0.40 - 1.20 mg/dL   Total Bilirubin 0.4 0.2 - 1.2 mg/dL   Alkaline Phosphatase 76 39 - 117 U/L   AST 12 0 - 37 U/L   ALT 14 0 - 35 U/L   Total Protein 7.3 6.0 - 8.3 g/dL   Albumin 4.2 3.5 - 5.2 g/dL   Calcium 10.5 8.4 - 10.5 mg/dL   GFR 102.35 >60.00 mL/min  Hemoglobin A1c     Status: None   Collection Time: 11/29/15  4:37 PM  Result Value Ref Range   Hgb A1c MFr Bld 5.9 4.6 - 6.5 %    Comment: Glycemic Control Guidelines for People with Diabetes:Non Diabetic:  <6%Goal of Therapy: <7%Additional Action Suggested:  >8%   Lipid panel     Status: Abnormal   Collection Time: 11/29/15  4:37 PM  Result Value Ref Range   Cholesterol 225 (H) 0 - 200 mg/dL    Comment: ATP III Classification       Desirable:  < 200 mg/dL               Borderline High:  200 - 239 mg/dL          High:  > = 240 mg/dL   Triglycerides 126.0 0.0 - 149.0 mg/dL    Comment: Normal:  <150 mg/dLBorderline High:  150 - 199 mg/dL   HDL 46.70 >39.00 mg/dL   VLDL 25.2 0.0 - 40.0 mg/dL   LDL Cholesterol 154 (H) 0 - 99 mg/dL   Total CHOL/HDL Ratio 5     Comment:                Men          Women1/2 Average Risk     3.4          3.3Average Risk          5.0          4.42X Average Risk          9.6          7.13X Average Risk          15.0          11.0                       NonHDL 178.76     Comment: NOTE:  Non-HDL goal should be 30 mg/dL higher than patient's LDL goal (i.e. LDL goal of < 70 mg/dL, would have non-HDL goal of < 100 mg/dL)   Follow up: Return in about 3 months (around 02/29/2016) for DM and HTN.  Wilfred Lacy, NP

## 2015-11-30 ENCOUNTER — Ambulatory Visit (INDEPENDENT_AMBULATORY_CARE_PROVIDER_SITE_OTHER)
Admission: RE | Admit: 2015-11-30 | Discharge: 2015-11-30 | Disposition: A | Discharge status: Home / Self Care | Payer: Commercial Managed Care - HMO | Source: Ambulatory Visit | Attending: Nurse Practitioner | Admitting: Nurse Practitioner

## 2015-11-30 ENCOUNTER — Ambulatory Visit (INDEPENDENT_AMBULATORY_CARE_PROVIDER_SITE_OTHER): Payer: Commercial Managed Care - HMO | Admitting: *Deleted

## 2015-11-30 ENCOUNTER — Encounter: Payer: Self-pay | Admitting: *Deleted

## 2015-11-30 ENCOUNTER — Other Ambulatory Visit: Payer: Commercial Managed Care - HMO

## 2015-11-30 ENCOUNTER — Ambulatory Visit (INDEPENDENT_AMBULATORY_CARE_PROVIDER_SITE_OTHER): Payer: Commercial Managed Care - HMO | Admitting: Nurse Practitioner

## 2015-11-30 ENCOUNTER — Encounter: Payer: Self-pay | Admitting: Nurse Practitioner

## 2015-11-30 ENCOUNTER — Other Ambulatory Visit: Payer: Self-pay | Admitting: *Deleted

## 2015-11-30 VITALS — BP 138/80 | HR 96 | Temp 98.6°F | Ht 62.0 in | Wt 178.0 lb

## 2015-11-30 DIAGNOSIS — R0789 Other chest pain: Secondary | ICD-10-CM

## 2015-11-30 DIAGNOSIS — M545 Low back pain, unspecified: Secondary | ICD-10-CM

## 2015-11-30 DIAGNOSIS — I480 Paroxysmal atrial fibrillation: Secondary | ICD-10-CM | POA: Diagnosis not present

## 2015-11-30 DIAGNOSIS — E1140 Type 2 diabetes mellitus with diabetic neuropathy, unspecified: Secondary | ICD-10-CM | POA: Diagnosis not present

## 2015-11-30 DIAGNOSIS — E782 Mixed hyperlipidemia: Secondary | ICD-10-CM | POA: Diagnosis not present

## 2015-11-30 DIAGNOSIS — R05 Cough: Secondary | ICD-10-CM | POA: Diagnosis not present

## 2015-11-30 DIAGNOSIS — I639 Cerebral infarction, unspecified: Secondary | ICD-10-CM | POA: Diagnosis not present

## 2015-11-30 DIAGNOSIS — N390 Urinary tract infection, site not specified: Secondary | ICD-10-CM | POA: Diagnosis not present

## 2015-11-30 LAB — POCT URINALYSIS DIPSTICK
Bilirubin, UA: NEGATIVE
Blood, UA: NEGATIVE
Glucose, UA: NEGATIVE
Ketones, UA: NEGATIVE
Leukocytes, UA: NEGATIVE
Nitrite, UA: NEGATIVE
Protein, UA: NEGATIVE
Spec Grav, UA: 1.025
Urobilinogen, UA: 0.2
pH, UA: 6.5

## 2015-11-30 MED ORDER — ACETAMINOPHEN 500 MG PO TABS: 1000.0000 mg | ORAL_TABLET | Freq: Four times a day (QID) | ORAL | 0 refills | Status: AC | PRN

## 2015-11-30 NOTE — Progress Notes (Signed)
Pre visit review using our clinic review tool, if applicable. No additional management support is needed unless otherwise documented below in the visit note. 

## 2015-11-30 NOTE — Progress Notes (Signed)
Subjective:  Patient ID: Kara HammockSheila L Mendoza, female    DOB: 14-Jan-1951  Age: 65 y.o. MRN: [id number]  CC: Back Pain (Pt stated having lower back pain and sometimes out of breath when rolling on the other side for 2 weeks)  Back Pain  This is a new problem. The current episode started 1 to 4 weeks ago. The problem occurs intermittently. The problem has been waxing and waning since onset. The pain is present in the lumbar spine (and right chest wall pain). The pain does not radiate. The pain is moderate. The pain is worse during the night. The symptoms are aggravated by position. Pertinent negatives include no abdominal pain, bladder incontinence, bowel incontinence, chest pain, dysuria, fever, headaches, leg pain, weakness or weight loss. Risk factors include sedentary lifestyle, poor posture, menopause and obesity. She has tried nothing for the symptoms.    Outpatient Medications Prior to Visit  Medication Sig Dispense Refill  . albuterol (PROVENTIL HFA;VENTOLIN HFA) 108 (90 Base) MCG/ACT inhaler Inhale 2 puffs into the lungs every 6 (six) hours as needed for wheezing or shortness of breath. 1 Inhaler 2  . atorvastatin (LIPITOR) 40 MG tablet Take 1 tablet (40 mg total) by mouth daily at 6 PM. 30 tablet 2  . diclofenac (VOLTAREN) 75 MG EC tablet Take 75 mg by mouth 2 (two) times daily.    . diclofenac sodium (VOLTAREN) 1 % GEL Apply topically 4 (four) times daily.    . DULoxetine (CYMBALTA) 20 MG capsule Take 1 capsule (20 mg total) by mouth daily. 30 capsule 2  . DULoxetine (CYMBALTA) 60 MG capsule     . gabapentin (NEURONTIN) 300 MG capsule Take 900 mg by mouth.    . gabapentin (NEURONTIN) 600 MG tablet Take 600 mg by mouth.    . gabapentin (NEURONTIN) 600 MG tablet     . Gabapentin Enacarbil (HORIZANT) 600 MG TBCR Take 600 mg by mouth 2 (two) times daily. 60 tablet 2  . Glucose Blood (BLOOD GLUCOSE TEST STRIPS) STRP 1 each by Other route as needed for Other. Use as instructed    . lidocaine  (XYLOCAINE) 5 % ointment Apply topically.    Marland Kitchen. losartan-hydrochlorothiazide (HYZAAR) 100-25 MG tablet Take 1 tablet by mouth daily. 30 tablet 2  . meloxicam (MOBIC) 7.5 MG tablet Take by mouth.    . metFORMIN (GLUCOPHAGE) 500 MG tablet Take 1 tablet (500 mg total) by mouth daily with breakfast. 30 tablet 2  . methimazole (TAPAZOLE) 5 MG tablet Take 1 tablet (5 mg total) by mouth daily. 30 tablet 1  . Multiple Vitamin (MULTIVITAMIN) capsule Take by mouth.    . Multiple Vitamins-Calcium (ONE-A-DAY WOMENS FORMULA PO) Take 1 tablet by mouth daily.    Marland Kitchen. omeprazole (PRILOSEC) 20 MG capsule Take 1 capsule (20 mg total) by mouth 2 (two) times daily. 60 capsule 1  . oxyCODONE-acetaminophen (PERCOCET) 10-325 MG per tablet Take 1 tablet by mouth every 8 (eight) hours as needed for pain. Reported on 07/27/2015    . pregabalin (LYRICA) 50 MG capsule Take by mouth.    . senna-docusate (SENOKOT-S) 8.6-50 MG tablet Take by mouth.    . sertraline (ZOLOFT) 50 MG tablet Take 1 tablet (50 mg total) by mouth at bedtime. 30 tablet 2  . simvastatin (ZOCOR) 10 MG tablet Take by mouth.    . topiramate (TOPAMAX) 25 MG tablet Take 1 Tablet at night x1 week, then 1 tablet BID    . zolpidem (AMBIEN) 5 MG tablet Take 1  tablet (5 mg total) by mouth at bedtime as needed for sleep. 30 tablet 2   No facility-administered medications prior to visit.     ROS See HPI  Objective:  BP 138/80 (BP Location: Right Arm, Patient Position: Sitting, Cuff Size: Normal)   Pulse 96   Temp 98.6 F (37 C)   Ht 5\' 2"  (1.575 m)   Wt 178 lb (80.7 kg)   SpO2 98%   BMI 32.56 kg/m   BP Readings from Last 3 Encounters:  11/30/15 138/80  11/29/15 140/78  07/27/15 138/72    Wt Readings from Last 3 Encounters:  11/30/15 178 lb (80.7 kg)  11/29/15 179 lb (81.2 kg)  07/27/15 181 lb 9.6 oz (82.4 kg)    Physical Exam  Constitutional: She is oriented to person, place, and time. No distress.  Neck: Normal range of motion. Neck supple.    Cardiovascular: Normal rate and normal heart sounds.   Pulmonary/Chest: Effort normal and breath sounds normal.  Abdominal: Soft. Bowel sounds are normal. She exhibits no distension. There is no tenderness.  Neurological: She is alert and oriented to person, place, and time.  Skin: Skin is warm and dry. No rash noted. No erythema.  Vitals reviewed.   Lab Results  Component Value Date   WBC 6.1 12/10/2014   HGB 15.6 (H) 06/05/2015   HCT 46.0 06/05/2015   PLT 393 12/10/2014   GLUCOSE 100 (H) 11/29/2015   CHOL 225 (H) 11/29/2015   TRIG 126.0 11/29/2015   HDL 46.70 11/29/2015   LDLCALC 154 (H) 11/29/2015   ALT 14 11/29/2015   AST 12 11/29/2015   NA 138 11/29/2015   K 4.0 11/29/2015   CL 104 11/29/2015   CREATININE 0.73 11/29/2015   BUN 10 11/29/2015   CO2 29 11/29/2015   TSH 0.268 (L) 12/10/2014   INR 0.98 08/09/2013   HGBA1C 5.9 11/29/2015   Normal chest x-ray  Assessment & Plan:   Kara Mendoza was seen today for back pain.  Diagnoses and all orders for this visit:  Right-sided chest wall pain -     DG Chest 2 View; Future -     acetaminophen (TYLENOL) 500 MG tablet; Take 2 tablets (1,000 mg total) by mouth every 6 (six) hours as needed.  Mixed hyperlipidemia -     Amb ref to Medical Nutrition Therapy-MNT  Type 2 diabetes mellitus with diabetic neuropathy, without long-term current use of insulin (HCC) -     Amb ref to Medical Nutrition Therapy-MNT  Acute bilateral low back pain without sciatica -     POCT urinalysis dipstick -     Urine culture; Future -     acetaminophen (TYLENOL) 500 MG tablet; Take 2 tablets (1,000 mg total) by mouth every 6 (six) hours as needed.   I am having Ms. Kuck start on acetaminophen. I am also having her maintain her Multiple Vitamins-Calcium (ONE-A-DAY WOMENS FORMULA PO), methimazole, oxyCODONE-acetaminophen, zolpidem, diclofenac sodium, diclofenac, sertraline, omeprazole, metFORMIN, losartan-hydrochlorothiazide, Gabapentin Enacarbil,  albuterol, DULoxetine, atorvastatin, lidocaine, senna-docusate, gabapentin, gabapentin, topiramate, simvastatin, BLOOD GLUCOSE TEST STRIPS, meloxicam, pregabalin, multivitamin, gabapentin, and DULoxetine.  Meds ordered this encounter  Medications  . acetaminophen (TYLENOL) 500 MG tablet    Sig: Take 2 tablets (1,000 mg total) by mouth every 6 (six) hours as needed.    Dispense:  30 tablet    Refill:  0    Order Specific Question:   Supervising Provider    Answer:   Tresa Garter [[id number]]  Follow-up: Return if symptoms worsen or fail to improve.  Wilfred Lacy, NP

## 2015-11-30 NOTE — Assessment & Plan Note (Signed)
She declined use of any cholesterol lowering medication.  she opted to use diet and life style changes. Referral to nutritionist made per patient's request.

## 2015-11-30 NOTE — Assessment & Plan Note (Signed)
Patient declined use of plavix and xarelto due to risk of bleeding 

## 2015-11-30 NOTE — Patient Instructions (Signed)
You will be called with urine culture and Chest x-ray results. Go to basement for chest x-ray.

## 2015-11-30 NOTE — Assessment & Plan Note (Signed)
Patient declined use of plavix and xarelto due to risk of bleeding

## 2015-11-30 NOTE — Progress Notes (Signed)
Reviewed with patient in office. See office note

## 2015-12-01 DIAGNOSIS — I639 Cerebral infarction, unspecified: Secondary | ICD-10-CM | POA: Diagnosis not present

## 2015-12-01 NOTE — Progress Notes (Signed)
Carelink Summary Report / Loop Recorder 

## 2015-12-02 ENCOUNTER — Telehealth: Payer: Self-pay

## 2015-12-02 DIAGNOSIS — I639 Cerebral infarction, unspecified: Secondary | ICD-10-CM | POA: Diagnosis not present

## 2015-12-02 LAB — URINE CULTURE

## 2015-12-02 NOTE — Telephone Encounter (Signed)
-----   Message from Anne Ngharlotte Lum Nche, NP sent at 12/02/2015  1:19 PM EDT ----- Urine culture indicates minimal growth of multiple organisms. This is an indication of bacterial colonization and normal flora. No oral antibiotics needed at this time.

## 2015-12-02 NOTE — Telephone Encounter (Signed)
I tried to call patient---it sounds like someone is trying to answer, but I cant hear anyone respond to me talking---if patient calls back, can talk with tamara

## 2015-12-04 NOTE — Progress Notes (Signed)
Cardiology Office Note    Date:  12/07/2015   ID:  Kara Mendoza, DOB December 07, 1950, MRN [id number]  PCP:  Alysia Pennaharlotte Nche, NP  Cardiologist: Dr. Graciela HusbandsKlein  CC: follow up afib  History of Present Illness:  Kara Mendoza is a 65 y.o. female with a history of cryptogenic CVA s/p loop recorder, PAF incidentally found on loop recorder, HTN, HLD and reflex sympathetic dystrophy who presents to clinic for follow up.   Seen in followup by Dr. Graciela HusbandsKlein for cryptogenic stroke for which she had implanted 7/15 implantable loop recorder. Echocardiogram at that time demonstrated an ejection fraction 55-60%. Review of 08/02/15 interrogation  showed 1 tachy episode--reviewed with SK, and felt to be likely AF. She was seen in AF clinic on 6/21 and started on Xarelto.  Today she presents to clinic for follow up. She has not been taking Xarelto because it is too dangerous and she has had strokes before. She is very worried about bleeding. She is allergic to Aspirin. No chest pain. Some SOB that has been ongoing for years. It is sometimes worse with exertion. No LE edema, orthopnea or PND. No dizziness or syncope. No palpitations. No blood in stool or urine. She has felt fatigued recently.    Past Medical History:  Diagnosis Date  . Abnormality of gait   . Acute bronchitis   . Allergy   . Anxiety   . Anxiety state, unspecified   . Asthma   . Chronic arm pain 2008   left  . Chronic headache   . Chronic leg pain 2008   left  . Chronic pain syndrome   . Constipation   . Diabetes mellitus (HCC)   . Dysarthria   . Esophageal reflux   . Fatigue   . History of fall   . Hyperlipidemia   . Hypertension   . Hyperthyroidism   . LOOP Recorder 10/14/2013  . Major depressive disorder, recurrent episode, moderate (HCC)   . Memory loss   . Neuromuscular disorder (HCC)   . Osteoarthritis   . Stroke (HCC)   . Tobacco use disorder     Past Surgical History:  Procedure Laterality Date  . CESAREAN SECTION     x3   . FINGER SURGERY    . LOOP RECORDER IMPLANT  08-12-2013   MDT LINQ implanted by Dr Graciela HusbandsKlein for cryptogenic stroke  . LOOP RECORDER IMPLANT N/A 08/12/2013   Procedure: LOOP RECORDER IMPLANT;  Surgeon: Duke SalviaSteven C Klein, MD;  [facility];  Service: Cardiovascular;  Laterality: N/A;  . TEE WITHOUT CARDIOVERSION N/A 08/12/2013   Procedure: TRANSESOPHAGEAL ECHOCARDIOGRAM (TEE);  Surgeon: Wendall StadePeter C Nishan, MD;  [facility];  Service: Cardiovascular;  Laterality: N/A;    Current Medications: Outpatient Medications Prior to Visit  Medication Sig Dispense Refill  . acetaminophen (TYLENOL) 500 MG tablet Take 2 tablets (1,000 mg total) by mouth every 6 (six) hours as needed. 30 tablet 0  . albuterol (PROVENTIL HFA;VENTOLIN HFA) 108 (90 Base) MCG/ACT inhaler Inhale 2 puffs into the lungs every 6 (six) hours as needed for wheezing or shortness of breath. 1 Inhaler 2  . atorvastatin (LIPITOR) 40 MG tablet Take 1 tablet (40 mg total) by mouth daily at 6 PM. 30 tablet 2  . diclofenac sodium (VOLTAREN) 1 % GEL Apply topically 4 (four) times daily.    . Glucose Blood (BLOOD GLUCOSE TEST STRIPS) STRP 1 each by Other route as needed for Other. Use as instructed    . losartan-hydrochlorothiazide (  HYZAAR) 100-25 MG tablet Take 1 tablet by mouth daily. 30 tablet 2  . metFORMIN (GLUCOPHAGE) 500 MG tablet Take 1 tablet (500 mg total) by mouth daily with breakfast. 30 tablet 2  . methimazole (TAPAZOLE) 5 MG tablet Take 1 tablet (5 mg total) by mouth daily. 30 tablet 1  . Multiple Vitamins-Calcium (ONE-A-DAY WOMENS FORMULA PO) Take 1 tablet by mouth daily.    Marland Kitchen. omeprazole (PRILOSEC) 20 MG capsule Take 1 capsule (20 mg total) by mouth 2 (two) times daily. 60 capsule 1  . oxyCODONE-acetaminophen (PERCOCET) 10-325 MG per tablet Take 1 tablet by mouth every 8 (eight) hours as needed for pain. Reported on 07/27/2015    . pregabalin (LYRICA) 50 MG capsule Take 50 mg by mouth 2 (two) times daily.     . sertraline  (ZOLOFT) 50 MG tablet Take 1 tablet (50 mg total) by mouth at bedtime. 30 tablet 2  . zolpidem (AMBIEN) 5 MG tablet Take 1 tablet (5 mg total) by mouth at bedtime as needed for sleep. 30 tablet 2  . diclofenac (VOLTAREN) 75 MG EC tablet Take 75 mg by mouth 2 (two) times daily.    . DULoxetine (CYMBALTA) 20 MG capsule Take 1 capsule (20 mg total) by mouth daily. (Patient not taking: Reported on 12/07/2015) 30 capsule 2  . DULoxetine (CYMBALTA) 60 MG capsule     . gabapentin (NEURONTIN) 300 MG capsule Take 900 mg by mouth.    . gabapentin (NEURONTIN) 600 MG tablet Take 600 mg by mouth.    . gabapentin (NEURONTIN) 600 MG tablet     . Gabapentin Enacarbil (HORIZANT) 600 MG TBCR Take 600 mg by mouth 2 (two) times daily. (Patient not taking: Reported on 12/07/2015) 60 tablet 2  . lidocaine (XYLOCAINE) 5 % ointment Apply topically.    . meloxicam (MOBIC) 7.5 MG tablet Take by mouth.    . Multiple Vitamin (MULTIVITAMIN) capsule Take by mouth.    . senna-docusate (SENOKOT-S) 8.6-50 MG tablet Take by mouth.    . simvastatin (ZOCOR) 10 MG tablet Take by mouth.    . topiramate (TOPAMAX) 25 MG tablet Take 1 Tablet at night x1 week, then 1 tablet BID     No facility-administered medications prior to visit.      Allergies:   Aspirin; Diclofenac; and Penicillins   Social History   Social History  . Marital status: Single    Spouse name: N/A  . Number of children: 3  . Years of education: College   Occupational History  .  Other    Disabled   Social History Main Topics  . Smoking status: Light Tobacco Smoker    Packs/day: 0.20    Years: 15.00    Types: Cigarettes  . Smokeless tobacco: Never Used     Comment: Tryin to quit, 07/08/14 smoking 3-4 a day  . Alcohol use No  . Drug use: No  . Sexual activity: Not Asked   Other Topics Concern  . None   Social History Narrative   Patient is single with 3 children.   Patient is right handed.   Patient has college education.   Patient drinks 2  cups daily.     Family History:  The patient's family history includes Cancer in her father; Diabetes in her brother and sister.     ROS:   Please see the history of present illness.    ROS All other systems reviewed and are negative.   PHYSICAL EXAM:   VS:  BP Marland Kitchen(!)  152/84   Pulse 94   Ht 5\' 2"  (1.575 m)   Wt 179 lb 12.8 oz (81.6 kg)   BMI 32.89 kg/m    GEN: Well nourished, well developed, in no acute distress  HEENT: normal  Neck: no JVD, carotid bruits, or masses Cardiac: RRR; no murmurs, rubs, or gallops,no edema  Respiratory:  clear to auscultation bilaterally, normal work of breathing GI: soft, nontender, nondistended, + BS MS: no deformity or atrophy  Skin: warm and dry, no rash Neuro:  Alert and Oriented x 3, Strength and sensation are intact Psych: euthymic mood, full affect  Wt Readings from Last 3 Encounters:  12/07/15 179 lb 12.8 oz (81.6 kg)  12/05/15 178 lb (80.7 kg)  11/30/15 178 lb (80.7 kg)      Studies/Labs Reviewed:   EKG:  EKG is ordered today.  The ekg ordered today demonstrates NSR with RAE HR 94  Recent Labs: 12/10/2014: Platelets 393 06/05/2015: Hemoglobin 15.6 11/29/2015: ALT 14; BUN 10; Creatinine, Ser 0.73; Potassium 4.0; Sodium 138 12/05/2015: TSH 0.36   Lipid Panel    Component Value Date/Time   CHOL 225 (H) 11/29/2015 1637   TRIG 126.0 11/29/2015 1637   HDL 46.70 11/29/2015 1637   CHOLHDL 5 11/29/2015 1637   VLDL 25.2 11/29/2015 1637   LDLCALC 154 (H) 11/29/2015 1637    Additional studies/ records that were reviewed today include:  2D ECHO: 08/10/2013 LV EF: 55% -  60% Study Conclusions - Left ventricle: The cavity size was normal. Systolic function was normal. The estimated ejection fraction was in the range of 55% to 60%. Wall motion was normal; there were no regional wall motion abnormalities. - Atrial septum: No defect or patent foramen ovale was identified.   ASSESSMENT & PLAN:   PAF: she was initially placed  on Xarelto for CHADSVASC score of at least 3 (HTN, age , f sex). She has not been taking it due to the risks of bleeding. We had a long discussion about how Xarelto prevents strokes. She is adamant about not taking it. She was previously on plavix but ran out (allergic to ASA). No rate control agents given low AF burden and paucity of symptoms .  HTN: BP mildly elevated today at 152/82 but forgot to take her BP meds this AM. Continue Hyzaar  HLD: continue statin   Hx of CVA: ran out of plavix and allergic to ASA. Will prescribe this for her   Medication Adjustments/Labs and Tests Ordered: Current medicines are reviewed at length with the patient today.  Concerns regarding medicines are outlined above.  Medication changes, Labs and Tests ordered today are listed in the Patient Instructions below. Patient Instructions  Medication Instructions:  Your physician recommends that you continue on your current medications as directed. Please refer to the Current Medication list given to you today.   Labwork: None ordered  Testing/Procedures: None ordered  Follow-Up: Your physician recommends that you schedule a follow-up appointment in:    Any Other Special Instructions Will Be Listed Below (If Applicable).     If you need a refill on your cardiac medications before your next appointment, please call your pharmacy.      Signed, Cline Crock, PA-C  12/07/2015 4:14 PM    [facility] [address] [place], [place], [place]  [address] Phone: [phone]; Fax: [phone]

## 2015-12-05 ENCOUNTER — Encounter: Payer: Self-pay | Admitting: Endocrinology

## 2015-12-05 ENCOUNTER — Ambulatory Visit (INDEPENDENT_AMBULATORY_CARE_PROVIDER_SITE_OTHER): Payer: Commercial Managed Care - HMO | Admitting: Endocrinology

## 2015-12-05 VITALS — BP 128/78 | HR 92 | Ht 62.0 in | Wt 178.0 lb

## 2015-12-05 DIAGNOSIS — E0590 Thyrotoxicosis, unspecified without thyrotoxic crisis or storm: Secondary | ICD-10-CM

## 2015-12-05 DIAGNOSIS — E042 Nontoxic multinodular goiter: Secondary | ICD-10-CM

## 2015-12-05 DIAGNOSIS — I639 Cerebral infarction, unspecified: Secondary | ICD-10-CM | POA: Diagnosis not present

## 2015-12-05 LAB — TSH: TSH: 0.36 u[IU]/mL (ref 0.35–4.50)

## 2015-12-05 LAB — T4, FREE: Free T4: 0.82 ng/dL (ref 0.60–1.60)

## 2015-12-05 NOTE — Progress Notes (Signed)
Please let patient know that the thyroid is functioning normally and she does not need to take methimazole again Follow-up with PCP as needed

## 2015-12-05 NOTE — Progress Notes (Addendum)
Patient ID: Kara Mendoza, female   DOB: [birth year], 65 y.o.   MRN: [id number]                                                                                                                Reason for Appointment:  ?  Hyperthyroidism, new consultation  Referring physician: Nche   History of Present Illness:   The patient is somewhat unclear about how her hyperthyroidism were diagnosed Some of the history is obtained from [facility] She may have been diagnosed to have Hyperthyroidism in 2011 by a local endocrinologist, records not available. She thinks she may have had some palpitations at that time but does not recall having any heat sensitivity or weight loss  She apparently had been on 5 mg of methimazole for several years Does not appear that her medication was changed on her last visit with the [facility]inologist [facility] but there is no confirmation that she was taking the medication at that time Thyroid levels in the past have been usually normal with only occasional mildly suppressed TSH levels  The patient says that [facility] today indicates that she has not had this filled since 12/2014  Last TSH was 0.69 in 6/17  Lab Results  Component Value Date   TSH 0.268 (L) 12/10/2014   TSH 3.400 08/09/2013   TSH 0.949 10/11/2011    THYROID nodule: She had [facility] thyroid [facility] which showed a 0.9 cm primarily cystic thyroid nodule in the right upper pole along with another 0.9 cm nodule in the right lower pole.    Medication List       Accurate as of 12/05/15  3:31 PM. Always use your most recent med list.          acetaminophen 500 MG tablet Commonly known as:  TYLENOL Take 2 tablets (1,000 mg total) by mouth every 6 (six) hours as needed.   albuterol 108 (90 Base) MCG/ACT inhaler Commonly known as:  PROVENTIL HFA;VENTOLIN HFA Inhale 2 puffs into the lungs  every 6 (six) hours as needed for wheezing or shortness of breath.   atorvastatin 40 MG tablet Commonly known as:  LIPITOR Take 1 tablet (40 mg total) by mouth daily at 6 PM.   BLOOD GLUCOSE TEST STRIPS Strp 1 each by Other route as needed for Other. Use as instructed   diclofenac 75 MG EC tablet Commonly known as:  VOLTAREN Take 75 mg by mouth 2 (two) times daily.   DULoxetine 60 MG capsule Commonly known as:  CYMBALTA   DULoxetine 20 MG capsule Commonly known as:  CYMBALTA Take 1 capsule (20 mg total) by mouth daily.   gabapentin 300 MG capsule Commonly known as:  NEURONTIN Take 900 mg by mouth.   gabapentin 600 MG tablet Commonly known as:  NEURONTIN Take 600 mg by mouth.   gabapentin 600 MG tablet Commonly known as:  NEURONTIN  Gabapentin Enacarbil 600 MG Tbcr Commonly known as:  HORIZANT Take 600 mg by mouth 2 (two) times daily.   lidocaine 5 % ointment Commonly known as:  XYLOCAINE Apply topically.   losartan-hydrochlorothiazide 100-25 MG tablet Commonly known as:  HYZAAR Take 1 tablet by mouth daily.   meloxicam 7.5 MG tablet Commonly known as:  MOBIC Take by mouth.   metFORMIN 500 MG tablet Commonly known as:  GLUCOPHAGE Take 1 tablet (500 mg total) by mouth daily with breakfast.   methimazole 5 MG tablet Commonly known as:  TAPAZOLE Take 1 tablet (5 mg total) by mouth daily.   multivitamin capsule Take by mouth.   omeprazole 20 MG capsule Commonly known as:  PRILOSEC Take 1 capsule (20 mg total) by mouth 2 (two) times daily.   ONE-A-DAY WOMENS FORMULA PO Take 1 tablet by mouth daily.   oxyCODONE-acetaminophen 10-325 MG tablet Commonly known as:  PERCOCET Take 1 tablet by mouth every 8 (eight) hours as needed for pain. Reported on 07/27/2015   pregabalin 50 MG capsule Commonly known as:  LYRICA Take by mouth.   senna-docusate 8.6-50 MG tablet Commonly known as:  Senokot-S Take by mouth.   sertraline 50 MG tablet Commonly known as:   ZOLOFT Take 1 tablet (50 mg total) by mouth at bedtime.   simvastatin 10 MG tablet Commonly known as:  ZOCOR Take by mouth.   topiramate 25 MG tablet Commonly known as:  TOPAMAX Take 1 Tablet at night x1 week, then 1 tablet BID   VOLTAREN 1 % Gel Generic drug:  diclofenac sodium Apply topically 4 (four) times daily.   zolpidem 5 MG tablet Commonly known as:  AMBIEN Take 1 tablet (5 mg total) by mouth at bedtime as needed for sleep.           Past Medical History:  Diagnosis Date  . Abnormality of gait   . Acute bronchitis   . Allergy   . Anxiety   . Anxiety state, unspecified   . Asthma   . Chronic arm pain 2008   left  . Chronic headache   . Chronic leg pain 2008   left  . Chronic pain syndrome   . Constipation   . Diabetes mellitus (HCC)   . Dysarthria   . Esophageal reflux   . Fatigue   . History of fall   . Hyperlipidemia   . Hypertension   . Hyperthyroidism   . LOOP Recorder 10/14/2013  . Major depressive disorder, recurrent episode, moderate (HCC)   . Memory loss   . Neuromuscular disorder (HCC)   . Osteoarthritis   . Stroke (HCC)   . Tobacco use disorder     Past Surgical History:  Procedure Laterality Date  . CESAREAN SECTION     x3  . FINGER SURGERY    . LOOP RECORDER IMPLANT  08-12-2013   MDT LINQ implanted by Dr Graciela Husbands for cryptogenic stroke  . LOOP RECORDER IMPLANT N/A 08/12/2013   Procedure: LOOP RECORDER IMPLANT;  Surgeon: Duke Salvia, MD;  [facility] C[facility] LAB;  Service: Cardiovascular;  Laterality: N/A;  . TEE WITHOUT CARDIOVERSION N/A 08/12/2013   Procedure: TRANSESOPHAGEAL ECHOCARDIOGRAM (TEE);  Surgeon: Wendall Stade, MD;  Location: [facility] ENDOSCOPY;  Service: Cardiovascular;  Laterality: N/A;    Family History  Problem Relation Age of Onset  . Cancer Father   . Diabetes Sister   . Diabetes Brother     Social History:  reports that she has been smoking Cigarettes.  She has a 3.00 pack-year smoking history. She has never used  smokeless tobacco. She reports that she does not drink alcohol or use drugs.  Allergies:  Allergies  Allergen Reactions  . Aspirin Other (See Comments), Hives and Itching    Also itching Possibly itching and "feeling little weird"  . Diclofenac Shortness Of Breath  . Penicillins Hives     Review of Systems    Examination:   BP 128/78   Pulse 92   Ht 5\' 2"  (1.575 m)   Wt 178 lb (80.7 kg)   SpO2 97%   BMI 32.56 kg/m    General Appearance:  well-built and nourished, pleasant, not anxious or hyperkinetic.        Eyes: No unusual prominence, lid lag or stare. No swelling of the eyelids  Neck: The thyroid Not palpable There is no lymphadenopathy .          Heart: normal S1 and S2, no murmurs .          Lungs: breath sounds are clear bilaterally Abdomen: Exam not indicated  Extremities: hands are normal to touch.  No ankle edema. Neurological: Occasional mild slurring of speech present Motor power not tested on the left, has pain and stiffness on movement of the left arm Deep tendon reflexes at right biceps is normal, difficult to elicit on the left No fine tremors are present. Skin: No rash, abnormal thickening of the skin on legs or pigmentation seen     Assessment/Plan:   Hyperthyroidism by history, unclear of the actual diagnosis and baseline evaluation and labs. It appears that she has not had a prescription for methimazole since 11/16 when checked with her [facility] The patient is not clear about her medication regimen anyway  She does not appear to be clinically hyperthyroid No thyroid enlargement palpable  Thyroid nodule: She has had a subcentimeter thyroid nodule on the left lobe, this is unlikely to be clinically significant  Will check thyroid levels today and if she is euthyroid will leave her off methimazole and she can follow-up with PCP periodically  Kara Mendoza 12/05/2015, 3:31 PM   Addendum: Thyroid levels are normal, she can continue to stay  off of methimazole and be seen as needed

## 2015-12-06 DIAGNOSIS — I639 Cerebral infarction, unspecified: Secondary | ICD-10-CM | POA: Diagnosis not present

## 2015-12-07 ENCOUNTER — Encounter: Payer: Self-pay | Admitting: Physician Assistant

## 2015-12-07 ENCOUNTER — Emergency Department (HOSPITAL_COMMUNITY)
Admission: EM | Admit: 2015-12-07 | Discharge: 2015-12-08 | Disposition: A | Discharge status: Home / Self Care | Payer: Commercial Managed Care - HMO | Attending: Physician Assistant | Admitting: Physician Assistant

## 2015-12-07 ENCOUNTER — Ambulatory Visit (INDEPENDENT_AMBULATORY_CARE_PROVIDER_SITE_OTHER): Payer: Commercial Managed Care - HMO | Admitting: Physician Assistant

## 2015-12-07 ENCOUNTER — Encounter (HOSPITAL_COMMUNITY): Payer: Self-pay | Admitting: Emergency Medicine

## 2015-12-07 ENCOUNTER — Emergency Department (HOSPITAL_COMMUNITY): Payer: Commercial Managed Care - HMO

## 2015-12-07 VITALS — BP 152/84 | HR 94 | Ht 62.0 in | Wt 179.8 lb

## 2015-12-07 DIAGNOSIS — Z79899 Other long term (current) drug therapy: Secondary | ICD-10-CM | POA: Insufficient documentation

## 2015-12-07 DIAGNOSIS — Z8673 Personal history of transient ischemic attack (TIA), and cerebral infarction without residual deficits: Secondary | ICD-10-CM | POA: Diagnosis not present

## 2015-12-07 DIAGNOSIS — I639 Cerebral infarction, unspecified: Secondary | ICD-10-CM | POA: Diagnosis not present

## 2015-12-07 DIAGNOSIS — R42 Dizziness and giddiness: Secondary | ICD-10-CM | POA: Insufficient documentation

## 2015-12-07 DIAGNOSIS — E119 Type 2 diabetes mellitus without complications: Secondary | ICD-10-CM | POA: Diagnosis not present

## 2015-12-07 DIAGNOSIS — J45909 Unspecified asthma, uncomplicated: Secondary | ICD-10-CM | POA: Diagnosis not present

## 2015-12-07 DIAGNOSIS — E785 Hyperlipidemia, unspecified: Secondary | ICD-10-CM

## 2015-12-07 DIAGNOSIS — R197 Diarrhea, unspecified: Secondary | ICD-10-CM | POA: Diagnosis not present

## 2015-12-07 DIAGNOSIS — F17210 Nicotine dependence, cigarettes, uncomplicated: Secondary | ICD-10-CM | POA: Insufficient documentation

## 2015-12-07 DIAGNOSIS — R791 Abnormal coagulation profile: Secondary | ICD-10-CM | POA: Insufficient documentation

## 2015-12-07 DIAGNOSIS — I480 Paroxysmal atrial fibrillation: Secondary | ICD-10-CM | POA: Diagnosis not present

## 2015-12-07 DIAGNOSIS — R51 Headache: Secondary | ICD-10-CM | POA: Diagnosis not present

## 2015-12-07 DIAGNOSIS — R404 Transient alteration of awareness: Secondary | ICD-10-CM | POA: Diagnosis not present

## 2015-12-07 LAB — URINALYSIS, ROUTINE W REFLEX MICROSCOPIC
Bilirubin Urine: NEGATIVE
Glucose, UA: NEGATIVE mg/dL
Hgb urine dipstick: NEGATIVE
Ketones, ur: NEGATIVE mg/dL
Leukocytes, UA: NEGATIVE
Nitrite: NEGATIVE
Protein, ur: NEGATIVE mg/dL
Specific Gravity, Urine: 1.011 (ref 1.005–1.030)
pH: 6 (ref 5.0–8.0)

## 2015-12-07 LAB — CBC
HCT: 41.2 % (ref 36.0–46.0)
Hemoglobin: 14 g/dL (ref 12.0–15.0)
MCH: 29.8 pg (ref 26.0–34.0)
MCHC: 34 g/dL (ref 30.0–36.0)
MCV: 87.7 fL (ref 78.0–100.0)
Platelets: 379 10*3/uL (ref 150–400)
RBC: 4.7 MIL/uL (ref 3.87–5.11)
RDW: 12.8 % (ref 11.5–15.5)
WBC: 14.7 10*3/uL — ABNORMAL HIGH (ref 4.0–10.5)

## 2015-12-07 LAB — COMPREHENSIVE METABOLIC PANEL
ALT: 23 U/L (ref 14–54)
AST: 20 U/L (ref 15–41)
Albumin: 3.7 g/dL (ref 3.5–5.0)
Alkaline Phosphatase: 69 U/L (ref 38–126)
Anion gap: 4 — ABNORMAL LOW (ref 5–15)
BUN: 7 mg/dL (ref 6–20)
CO2: 27 mmol/L (ref 22–32)
Calcium: 9.9 mg/dL (ref 8.9–10.3)
Chloride: 103 mmol/L (ref 101–111)
Creatinine, Ser: 0.8 mg/dL (ref 0.44–1.00)
GFR calc Af Amer: >60 mL/min (ref >60)
GFR calc non Af Amer: >60 mL/min (ref >60)
Glucose, Bld: 156 mg/dL — ABNORMAL HIGH (ref 65–99)
Potassium: 4.8 mmol/L (ref 3.5–5.1)
Sodium: 134 mmol/L — ABNORMAL LOW (ref 135–145)
Total Bilirubin: 0.3 mg/dL (ref 0.3–1.2)
Total Protein: 6.3 g/dL — ABNORMAL LOW (ref 6.5–8.1)

## 2015-12-07 LAB — APTT: aPTT: 27 seconds (ref 24–36)

## 2015-12-07 LAB — I-STAT TROPONIN, ED: Troponin i, poc: 0.01 ng/mL (ref 0.00–0.08)

## 2015-12-07 LAB — PROTIME-INR
INR: 0.93
Prothrombin Time: 12.5 seconds (ref 11.4–15.2)

## 2015-12-07 LAB — I-STAT CHEM 8, ED
BUN: 8 mg/dL (ref 6–20)
Calcium, Ion: 1.22 mmol/L (ref 1.15–1.40)
Chloride: 101 mmol/L (ref 101–111)
Creatinine, Ser: 0.8 mg/dL (ref 0.44–1.00)
Glucose, Bld: 146 mg/dL — ABNORMAL HIGH (ref 65–99)
HCT: 42 % (ref 36.0–46.0)
Hemoglobin: 14.3 g/dL (ref 12.0–15.0)
Potassium: 4.7 mmol/L (ref 3.5–5.1)
Sodium: 137 mmol/L (ref 135–145)
TCO2: 25 mmol/L (ref 0–100)

## 2015-12-07 LAB — DIFFERENTIAL
Basophils Absolute: 0 10*3/uL (ref 0.0–0.1)
Basophils Relative: 0 %
Eosinophils Absolute: 0.1 10*3/uL (ref 0.0–0.7)
Eosinophils Relative: 1 %
Lymphocytes Relative: 13 %
Lymphs Abs: 2 10*3/uL (ref 0.7–4.0)
Monocytes Absolute: 0.6 10*3/uL (ref 0.1–1.0)
Monocytes Relative: 4 %
Neutro Abs: 12 10*3/uL — ABNORMAL HIGH (ref 1.7–7.7)
Neutrophils Relative %: 82 %

## 2015-12-07 LAB — CBG MONITORING, ED: Glucose-Capillary: 126 mg/dL — ABNORMAL HIGH (ref 65–99)

## 2015-12-07 LAB — ETHANOL: Alcohol, Ethyl (B): <5 mg/dL (ref <5)

## 2015-12-07 MED ORDER — CLOPIDOGREL BISULFATE 75 MG PO TABS: 75.0000 mg | ORAL_TABLET | Freq: Every day | ORAL | 3 refills | Status: DC

## 2015-12-07 NOTE — Patient Instructions (Addendum)
Medication Instructions:  Your physician recommends that you continue on your current medications as directed. Please refer to the Current Medication list given to you today.   Labwork: None ordered  Testing/Procedures: None ordered  Follow-Up: Your physician wants you to follow-up in: 1 YEAR WITH DR. Logan BoresKLEIN  You will receive a reminder letter in the mail two months in advance. If you don't receive a letter, please call our office to schedule the follow-up appointment.   Any Other Special Instructions Will Be Listed Below (If Applicable).     If you need a refill on your cardiac medications before your next appointment, please call your pharmacy.

## 2015-12-07 NOTE — ED Notes (Signed)
Patient transported to CT 

## 2015-12-07 NOTE — ED Provider Notes (Signed)
[facility]-EM[facility] DEPT Provider Note   CSN: [id number] Arrival date & time: 12/07/15  2201  History   Chief Complaint Chief Complaint  Patient presents with  . Dizziness    HPI Kara Mendoza is a 65 y.o. female.  HPI  Patient comes to the ER with PMH of multiple PMH including hypertension, hx of previous stroke, hyperlipidemia, GERD, diabetes, asthma, anxiety complaining of dizziness, diarrhea and near syncope. She says that while sitting down at dinner she began to feel off, she took a Losartan, got up and while walking down the hall way felt weak all over and near syncopal, she then had an episode of diarrhea- painless and non bloody. She continues to feel like he eyelids are heavy bilaterally. Denies vision changes or focal weakness. No pain, no headache, cough, dysuria, back pain, confusion, fever, syncope, neck pain, weight loss. Preceding this episode was a strong emotional reaction to something that happened.  Past Medical History:  Diagnosis Date  . Abnormality of gait   . Acute bronchitis   . Allergy   . Anxiety   . Anxiety state, unspecified   . Asthma   . Chronic arm pain 2008   left  . Chronic headache   . Chronic leg pain 2008   left  . Chronic pain syndrome   . Constipation   . Diabetes mellitus (HCC)   . Dysarthria   . Esophageal reflux   . Fatigue   . History of fall   . Hyperlipidemia   . Hypertension   . Hyperthyroidism   . LOOP Recorder 10/14/2013  . Major depressive disorder, recurrent episode, moderate (HCC)   . Memory loss   . Neuromuscular disorder (HCC)   . Osteoarthritis   . Stroke (HCC)   . Tobacco use disorder     Patient Active Problem List   Diagnosis Date Noted  . Carpal tunnel syndrome on left 11/29/2015  . DDD (degenerative disc disease), cervical 11/29/2015  . Depression 11/29/2015  . Chronic pain of left knee 09/04/2015  . Headache 09/04/2015  . Hypercalcemia 08/06/2015  . Pain in right upper arm 08/06/2015  . Paroxysmal atrial  fibrillation (HCC) 07/22/2015  . History of loop recorder 04/11/2015  . History of CVA (cerebrovascular accident) 03/17/2015  . Shortness of breath 06/23/2014  . Complex regional pain syndrome type 2 of upper extremity 11/05/2013  . Carpal tunnel syndrome on right 10/22/2013  . Chronic embolism and thrombosis of deep vein of left upper extremity (HCC) 10/22/2013  . Left arm pain 10/22/2013  . Cardiac device in situ 10/14/2013  . Essential hypertension 09/30/2013  . RSD (reflex sympathetic dystrophy) 09/30/2013  . Diabetes (HCC) 09/28/2013  . Hyperthyroidism 09/28/2013  . Median nerve neuritis 09/28/2013  . Ataxia, late effect of cerebrovascular disease 09/28/2013  . Spastic hemiplegia affecting dominant side (HCC) 09/28/2013  . CVA (cerebral infarction) 08/15/2013  . Cerebral embolism with cerebral infarction (HCC) 08/09/2013  . Dysarthria 08/09/2013  . Abnormality of gait 05/01/2012  . Incontinence of urine 05/01/2012  . Pneumonia 03/10/2012  . Tobacco abuse 01/28/2012  . Tobacco dependence syndrome 01/28/2012  . Fatigue 01/01/2011  . Weakness generalized 11/03/2010  . Headache(784.0) 09/01/2010  . Plantar fasciitis 09/01/2010  . Allergic rhinitis 07/11/2010  . Persistent cough 06/16/2010  . Asthma night-time symptoms 06/14/2010  . GERD (gastroesophageal reflux disease) 05/26/2010  . Stroke (HCC) 05/26/2010  . Hyperlipidemia 05/26/2010  . Osteoarthritis 05/26/2010  . Mononeuritis 05/26/2010    Past Surgical History:  Procedure Laterality Date  .  CESAREAN SECTION     x3  . FINGER SURGERY    . LOOP RECORDER IMPLANT  08-12-2013   MDT LINQ implanted by Dr Graciela HusbandsKlein for cryptogenic stroke  . LOOP RECORDER IMPLANT N/A 08/12/2013   Procedure: LOOP RECORDER IMPLANT;  Surgeon: Duke SalviaSteven C Klein, MD;  [facility];  Service: Cardiovascular;  Laterality: N/A;  . TEE WITHOUT CARDIOVERSION N/A 08/12/2013   Procedure: TRANSESOPHAGEAL ECHOCARDIOGRAM (TEE);  Surgeon: Wendall StadePeter C Nishan, MD;   [facility];  Service: Cardiovascular;  Laterality: N/A;    OB History    No data available       Home Medications    Prior to Admission medications   Medication Sig Start Date End Date Taking? Authorizing Provider  acetaminophen (TYLENOL) 500 MG tablet Take 2 tablets (1,000 mg total) by mouth every 6 (six) hours as needed. Patient taking differently: Take 1,000 mg by mouth every 6 (six) hours as needed for mild pain or headache.  11/30/15  Yes Bonna Gainsharlotte Lum Nche, NP  albuterol (PROVENTIL HFA;VENTOLIN HFA) 108 (90 Base) MCG/ACT inhaler Inhale 2 puffs into the lungs every 6 (six) hours as needed for wheezing or shortness of breath. 11/29/15  Yes Bonna Gainsharlotte Lum Nche, NP  atorvastatin (LIPITOR) 40 MG tablet Take 1 tablet (40 mg total) by mouth daily at 6 PM. 11/29/15  Yes Anne Ngharlotte Lum Nche, NP  clopidogrel (PLAVIX) 75 MG tablet Take 1 tablet (75 mg total) by mouth daily. 12/07/15  Yes Janetta HoraKathryn R Thompson, PA-C  diclofenac sodium (VOLTAREN) 1 % GEL Apply topically 4 (four) times daily.   Yes Historical Provider, MD  DULoxetine (CYMBALTA) 60 MG capsule Take 60 mg by mouth daily. 09/19/15  Yes Historical Provider, MD  gabapentin (NEURONTIN) 300 MG capsule Take 300 mg by mouth 3 (three) times daily.   Yes Historical Provider, MD  Glucose Blood (BLOOD GLUCOSE TEST STRIPS) STRP 1 each by Other route as needed for Other. Use as instructed   Yes Historical Provider, MD  losartan-hydrochlorothiazide (HYZAAR) 100-25 MG tablet Take 1 tablet by mouth daily. 11/29/15  Yes Anne Ngharlotte Lum Nche, NP  metFORMIN (GLUCOPHAGE) 500 MG tablet Take 1 tablet (500 mg total) by mouth daily with breakfast. 11/29/15  Yes Bonna Gainsharlotte Lum Nche, NP  Multiple Vitamins-Calcium (ONE-A-DAY WOMENS FORMULA PO) Take 1 tablet by mouth daily.   Yes Historical Provider, MD  omeprazole (PRILOSEC) 20 MG capsule Take 1 capsule (20 mg total) by mouth 2 (two) times daily. 11/29/15  Yes Bonna Gainsharlotte Lum Nche, NP  sertraline (ZOLOFT) 50 MG  tablet Take 1 tablet (50 mg total) by mouth at bedtime. 11/29/15  Yes Bonna Gainsharlotte Lum Nche, NP  zolpidem (AMBIEN) 5 MG tablet Take 1 tablet (5 mg total) by mouth at bedtime as needed for sleep. 12/10/14  Yes Carmelina DaneJeffery S Anderson, MD  methimazole (TAPAZOLE) 5 MG tablet Take 1 tablet (5 mg total) by mouth daily. Patient not taking: Reported on 12/07/2015 08/21/13   Mcarthur Rossettianiel J Angiulli, PA-C    Family History Family History  Problem Relation Age of Onset  . Cancer Father   . Diabetes Sister   . Diabetes Brother     Social History Social History  Substance Use Topics  . Smoking status: Light Tobacco Smoker    Packs/day: 0.20    Years: 15.00    Types: Cigarettes  . Smokeless tobacco: Never Used     Comment: Tryin to quit, 07/08/14 smoking 3-4 a day  . Alcohol use No     Allergies   Aspirin;  Diclofenac; and Penicillins   Review of Systems Review of Systems  Review of Systems All other systems negative except as documented in the HPI. All pertinent positives and negatives as reviewed in the HPI.  Physical Exam Updated Vital Signs BP 130/72   Pulse 78   Temp 98.8 F (37.1 C)   Resp 18   Ht 5\' 2"  (1.575 m)   Wt 81.2 kg   SpO2 98%   BMI 32.74 kg/m   Physical Exam  Constitutional: She appears well-developed and well-nourished. No distress.  HENT:  Head: Normocephalic and atraumatic.  Eyes: Conjunctivae and EOM are normal. Pupils are equal, round, and reactive to light. No scleral icterus.  Neck: Normal range of motion. Neck supple.  Cardiovascular: Normal rate, regular rhythm, normal heart sounds and intact distal pulses.   Pulmonary/Chest: Effort normal. No respiratory distress. She has no wheezes. She has no rales. She exhibits no tenderness.  Abdominal: Soft. Bowel sounds are normal. There is no tenderness. There is no rigidity, no rebound, no guarding and no CVA tenderness.  Musculoskeletal: She exhibits no edema or tenderness.  Neurological: She is alert. She displays a  negative Romberg sign.  Cranial nerves grossly intact on exam. Pt alert and oriented x 3 Upper and lower extremity strength is symmetrical and physiologic Normal muscular tone No facial droop Coordination intact, no limb ataxia,No pronator drift  Skin: Skin is warm and dry. No petechiae, no purpura and no rash noted. She is not diaphoretic.  Psychiatric: Her speech is not slurred. Cognition and memory are not impaired.  Nursing note and vitals reviewed.   ED Treatments / Results  Labs (all labs ordered are listed, but only abnormal results are displayed) Labs Reviewed  CBC - Abnormal; Notable for the following:       Result Value   WBC 14.7 (*)    All other components within normal limits  DIFFERENTIAL - Abnormal; Notable for the following:    Neutro Abs 12.0 (*)    All other components within normal limits  COMPREHENSIVE METABOLIC PANEL - Abnormal; Notable for the following:    Sodium 134 (*)    Glucose, Bld 156 (*)    Total Protein 6.3 (*)    Anion gap 4 (*)    All other components within normal limits  CBG MONITORING, ED - Abnormal; Notable for the following:    Glucose-Capillary 126 (*)    All other components within normal limits  I-STAT CHEM 8, ED - Abnormal; Notable for the following:    Glucose, Bld 146 (*)    All other components within normal limits  ETHANOL  PROTIME-INR  APTT  URINALYSIS, ROUTINE W REFLEX MICROSCOPIC (NOT AT [facility])  RAPID URINE DRUG SCREEN, HOSP PERFORMED  I-STAT TROPOININ, ED    EKG  EKG Interpretation None       Radiology Ct Head Wo Contrast  Result Date: 12/07/2015 CLINICAL DATA:  Headache with dizziness EXAM: CT HEAD WITHOUT CONTRAST TECHNIQUE: Contiguous axial images were obtained from the base of the skull through the vertex without intravenous contrast. COMPARISON:  MRI 08/10/2013, CT scan brain 08/09/2013 FINDINGS: Brain: Again visualized is hypodensity within the left posterior parietal lobe, unchanged and felt consistent with  chronic area of infarction. There is no acute territorial infarction or intracranial hemorrhage. There is no focal mass, mass effect or midline shift. The ventricles are similar in size and morphology. Vascular: No hyperdense vessels. Mild carotid artery calcifications. Skull: Mastoid air cells are clear.  There is no  fracture. Sinuses/Orbits: Minimal mucosal thickening in the sphenoid and ethmoid sinuses. No acute orbital abnormality. Other: None IMPRESSION: Chronic appearing left parietal infarct. No definite acute intracranial abnormality Electronically Signed   By: Jasmine Pang M.D.   On: 12/07/2015 23:44   Procedures Procedures (including critical care time)  Medications Ordered in ED Medications - No data to display  Initial Impression / Assessment and Plan / ED Course  I have reviewed the triage vital signs and the nursing notes.  Pertinent labs & imaging results that were available during my care of the patient were reviewed by me and considered in my medical decision making (see chart for details).  Clinical Course   Case discussed with attending who will see patient as well. The patients symptoms were precipitated by a strong stress reaction, work-up negative. No infection. No residual symptoms. Dr. Corlis Leak has seen patient as well and feels comfortable with the plan for the patient to be discharged home with strict return precautions and to otherwise follow-up with her PCP. Patient and family members feels comfortable with this plan as well.  Final Clinical Impressions(s) / ED Diagnoses   Final diagnoses:  Diarrhea, unspecified type    New Prescriptions New Prescriptions   No medications on file     Marlon Pel, PA-C 12/08/15 0017    Courteney Lyn Mackuen, MD 12/10/15 [phone]

## 2015-12-07 NOTE — ED Triage Notes (Signed)
Pt arrived to Ed via EMS. C/o onset of dizziness occurring at 1900 this evening while sitting. No LOC or fall. Still feels dizzy. Hx of strokes and HTN. of IV fluid provided by EMS. No c/o pain.

## 2015-12-08 DIAGNOSIS — I639 Cerebral infarction, unspecified: Secondary | ICD-10-CM | POA: Diagnosis not present

## 2015-12-08 LAB — RAPID URINE DRUG SCREEN, HOSP PERFORMED
Amphetamines: NOT DETECTED
Barbiturates: NOT DETECTED
Benzodiazepines: NOT DETECTED
Cocaine: NOT DETECTED
Opiates: NOT DETECTED
Tetrahydrocannabinol: NOT DETECTED

## 2015-12-09 DIAGNOSIS — I639 Cerebral infarction, unspecified: Secondary | ICD-10-CM | POA: Diagnosis not present

## 2015-12-10 DIAGNOSIS — I639 Cerebral infarction, unspecified: Secondary | ICD-10-CM | POA: Diagnosis not present

## 2015-12-11 DIAGNOSIS — I639 Cerebral infarction, unspecified: Secondary | ICD-10-CM | POA: Diagnosis not present

## 2015-12-12 DIAGNOSIS — I639 Cerebral infarction, unspecified: Secondary | ICD-10-CM | POA: Diagnosis not present

## 2015-12-13 DIAGNOSIS — I639 Cerebral infarction, unspecified: Secondary | ICD-10-CM | POA: Diagnosis not present

## 2015-12-14 DIAGNOSIS — I639 Cerebral infarction, unspecified: Secondary | ICD-10-CM | POA: Diagnosis not present

## 2015-12-15 ENCOUNTER — Other Ambulatory Visit: Payer: Self-pay

## 2015-12-15 DIAGNOSIS — I639 Cerebral infarction, unspecified: Secondary | ICD-10-CM | POA: Diagnosis not present

## 2015-12-15 NOTE — Patient Outreach (Signed)
[facility]) Care Management  12/15/2015  Kara HammockSheila L Mendoza [birth year] [id number]   Referral date: 12/12/15  Referral source:   [employer] / [employer] HMO gold Referral reason: patient request help with rent payment and insurance.  Request assistance with medication adherence/ understanding of dosing  Telephone call to patient regarding [employer] referral. Unable to reach patient. HIPAA compliant voice message left with call back phone number.  PLAN; RNCM will attempt 2nd telephone call within 3 business days.   George InaDavina Kainen Struckman RN,BSN,CCM [facility] Telephonic  [phone]

## 2015-12-16 ENCOUNTER — Other Ambulatory Visit: Payer: Self-pay

## 2015-12-16 DIAGNOSIS — I639 Cerebral infarction, unspecified: Secondary | ICD-10-CM | POA: Diagnosis not present

## 2015-12-16 NOTE — Patient Outreach (Signed)
[facility]) Care Management  12/16/2015  Kara HammockSheila L Mendoza [birth year] [id number]  Second telephone call to patient regarding [employer] referral.  Unable to reach patient. HIPAA compliant voice message left with call back phone number.   PLAN; RNCM will attempt 3rd telephone call to patient within  1 week.   Kara InaDavina Nygel Prokop RN,BSN,CCM [facility] Telephonic  [phone]

## 2015-12-17 DIAGNOSIS — I639 Cerebral infarction, unspecified: Secondary | ICD-10-CM | POA: Diagnosis not present

## 2015-12-18 DIAGNOSIS — I639 Cerebral infarction, unspecified: Secondary | ICD-10-CM | POA: Diagnosis not present

## 2015-12-19 DIAGNOSIS — I639 Cerebral infarction, unspecified: Secondary | ICD-10-CM | POA: Diagnosis not present

## 2015-12-20 ENCOUNTER — Other Ambulatory Visit: Payer: Self-pay

## 2015-12-20 DIAGNOSIS — I639 Cerebral infarction, unspecified: Secondary | ICD-10-CM | POA: Diagnosis not present

## 2015-12-20 NOTE — Patient Outreach (Signed)
[facility]) Care Management  12/20/2015  Kara Mendoza [birth year] [id number]  Third telephone call to patient regarding [employer] referral. Unable to reach patient. HIPAA compliant voice message left with call back phone number.    PLAN; RNCM will [facility] to attempt contact.   George InaDavina Kaesha Kirsch RN,BSN,CCM [facility] Telephonic  [phone]

## 2015-12-21 ENCOUNTER — Other Ambulatory Visit: Payer: Self-pay | Admitting: Nurse Practitioner

## 2015-12-21 DIAGNOSIS — M545 Low back pain, unspecified: Secondary | ICD-10-CM

## 2015-12-21 DIAGNOSIS — I639 Cerebral infarction, unspecified: Secondary | ICD-10-CM | POA: Diagnosis not present

## 2015-12-22 DIAGNOSIS — I639 Cerebral infarction, unspecified: Secondary | ICD-10-CM | POA: Diagnosis not present

## 2015-12-23 DIAGNOSIS — I639 Cerebral infarction, unspecified: Secondary | ICD-10-CM | POA: Diagnosis not present

## 2015-12-24 DIAGNOSIS — I639 Cerebral infarction, unspecified: Secondary | ICD-10-CM | POA: Diagnosis not present

## 2015-12-25 DIAGNOSIS — I639 Cerebral infarction, unspecified: Secondary | ICD-10-CM | POA: Diagnosis not present

## 2015-12-26 DIAGNOSIS — I639 Cerebral infarction, unspecified: Secondary | ICD-10-CM | POA: Diagnosis not present

## 2015-12-27 DIAGNOSIS — I639 Cerebral infarction, unspecified: Secondary | ICD-10-CM | POA: Diagnosis not present

## 2015-12-28 DIAGNOSIS — I639 Cerebral infarction, unspecified: Secondary | ICD-10-CM | POA: Diagnosis not present

## 2015-12-29 DIAGNOSIS — I639 Cerebral infarction, unspecified: Secondary | ICD-10-CM | POA: Diagnosis not present

## 2015-12-30 DIAGNOSIS — I639 Cerebral infarction, unspecified: Secondary | ICD-10-CM | POA: Diagnosis not present

## 2015-12-31 DIAGNOSIS — I639 Cerebral infarction, unspecified: Secondary | ICD-10-CM | POA: Diagnosis not present

## 2015-12-31 LAB — CUP PACEART REMOTE DEVICE CHECK
Date Time Interrogation Session: 20171026004004
Implantable Pulse Generator Implant Date: 20150708

## 2015-12-31 NOTE — Progress Notes (Signed)
Carelink summary report received. Battery status OK. Normal device function. No new symptom episodes, tachy episodes, brady, or pause episodes. No new AF episodes. Monthly summary reports and ROV/PRN 

## 2016-01-01 DIAGNOSIS — I639 Cerebral infarction, unspecified: Secondary | ICD-10-CM | POA: Diagnosis not present

## 2016-01-02 ENCOUNTER — Ambulatory Visit (INDEPENDENT_AMBULATORY_CARE_PROVIDER_SITE_OTHER): Payer: Commercial Managed Care - HMO | Admitting: *Deleted

## 2016-01-02 DIAGNOSIS — Z8673 Personal history of transient ischemic attack (TIA), and cerebral infarction without residual deficits: Secondary | ICD-10-CM | POA: Diagnosis not present

## 2016-01-02 DIAGNOSIS — I639 Cerebral infarction, unspecified: Secondary | ICD-10-CM | POA: Diagnosis not present

## 2016-01-03 DIAGNOSIS — I639 Cerebral infarction, unspecified: Secondary | ICD-10-CM | POA: Diagnosis not present

## 2016-01-03 NOTE — Progress Notes (Signed)
Carelink Summary Report / Loop Recorder 

## 2016-01-04 DIAGNOSIS — I639 Cerebral infarction, unspecified: Secondary | ICD-10-CM | POA: Diagnosis not present

## 2016-01-05 DIAGNOSIS — I639 Cerebral infarction, unspecified: Secondary | ICD-10-CM | POA: Diagnosis not present

## 2016-01-06 DIAGNOSIS — I639 Cerebral infarction, unspecified: Secondary | ICD-10-CM | POA: Diagnosis not present

## 2016-01-07 ENCOUNTER — Other Ambulatory Visit: Payer: Self-pay | Admitting: Nurse Practitioner

## 2016-01-07 DIAGNOSIS — I10 Essential (primary) hypertension: Secondary | ICD-10-CM

## 2016-01-07 DIAGNOSIS — I639 Cerebral infarction, unspecified: Secondary | ICD-10-CM | POA: Diagnosis not present

## 2016-01-08 DIAGNOSIS — I639 Cerebral infarction, unspecified: Secondary | ICD-10-CM | POA: Diagnosis not present

## 2016-01-09 DIAGNOSIS — I639 Cerebral infarction, unspecified: Secondary | ICD-10-CM | POA: Diagnosis not present

## 2016-01-10 DIAGNOSIS — I639 Cerebral infarction, unspecified: Secondary | ICD-10-CM | POA: Diagnosis not present

## 2016-01-11 DIAGNOSIS — I639 Cerebral infarction, unspecified: Secondary | ICD-10-CM | POA: Diagnosis not present

## 2016-01-12 DIAGNOSIS — I639 Cerebral infarction, unspecified: Secondary | ICD-10-CM | POA: Diagnosis not present

## 2016-01-13 DIAGNOSIS — I639 Cerebral infarction, unspecified: Secondary | ICD-10-CM | POA: Diagnosis not present

## 2016-01-14 DIAGNOSIS — I639 Cerebral infarction, unspecified: Secondary | ICD-10-CM | POA: Diagnosis not present

## 2016-01-15 DIAGNOSIS — I639 Cerebral infarction, unspecified: Secondary | ICD-10-CM | POA: Diagnosis not present

## 2016-01-16 ENCOUNTER — Ambulatory Visit: Payer: Commercial Managed Care - HMO | Admitting: Dietician

## 2016-01-20 ENCOUNTER — Other Ambulatory Visit: Payer: Self-pay

## 2016-01-20 NOTE — Patient Outreach (Signed)
[facility]) Care Management  01/20/2016  Marolyn HammockSheila L Foxworthy Jan 08, 1951 [id number]  Referral date: 12/12/15  Referral source:   [employer] / [employer] HMO gold Referral reason: patient request help with rent payment and insurance.  Request assistance with medication adherence/ understanding of dosing  No response from patient after 3 telephone calls and outreach letter attempt.  PLAN:  RNCM will refer patient to care management assistant to close due to inability to contact patient.  RNCM will notify patients primary MD of closure.   George InaDavina Westlynn Fifer RN,BSN,CCM [facility] Telephonic  [phone]

## 2016-01-23 ENCOUNTER — Encounter: Payer: Self-pay | Admitting: Dietician

## 2016-01-23 ENCOUNTER — Encounter: Payer: Commercial Managed Care - HMO | Attending: Nurse Practitioner | Admitting: Dietician

## 2016-01-23 DIAGNOSIS — E0590 Thyrotoxicosis, unspecified without thyrotoxic crisis or storm: Secondary | ICD-10-CM | POA: Insufficient documentation

## 2016-01-23 DIAGNOSIS — I10 Essential (primary) hypertension: Secondary | ICD-10-CM | POA: Diagnosis not present

## 2016-01-23 DIAGNOSIS — Z713 Dietary counseling and surveillance: Secondary | ICD-10-CM | POA: Diagnosis not present

## 2016-01-23 DIAGNOSIS — G629 Polyneuropathy, unspecified: Secondary | ICD-10-CM | POA: Insufficient documentation

## 2016-01-23 DIAGNOSIS — E1140 Type 2 diabetes mellitus with diabetic neuropathy, unspecified: Secondary | ICD-10-CM

## 2016-01-23 DIAGNOSIS — E785 Hyperlipidemia, unspecified: Secondary | ICD-10-CM | POA: Diagnosis not present

## 2016-01-23 DIAGNOSIS — F329 Major depressive disorder, single episode, unspecified: Secondary | ICD-10-CM | POA: Diagnosis not present

## 2016-01-23 DIAGNOSIS — Z8673 Personal history of transient ischemic attack (TIA), and cerebral infarction without residual deficits: Secondary | ICD-10-CM | POA: Diagnosis not present

## 2016-01-23 DIAGNOSIS — E119 Type 2 diabetes mellitus without complications: Secondary | ICD-10-CM | POA: Diagnosis not present

## 2016-01-23 DIAGNOSIS — E782 Mixed hyperlipidemia: Secondary | ICD-10-CM

## 2016-01-23 NOTE — Patient Instructions (Addendum)
Avoid going to the grocery store hungry. When you go to the grocery store, purchase fresh meat, vegetables, and fruit and less chips and sweets. Avoid sugar sweetened beverages. (Avoid juice, Hawaiian punch, and regular soda.)  Choose more water or diet soda. Walk every day.  Your Nurse Practitioner is [employer] Nche.  Her number is [phone].
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
## 2016-01-26 ENCOUNTER — Other Ambulatory Visit: Payer: Self-pay | Admitting: Nurse Practitioner

## 2016-01-26 DIAGNOSIS — E1140 Type 2 diabetes mellitus with diabetic neuropathy, unspecified: Secondary | ICD-10-CM

## 2016-01-26 DIAGNOSIS — E782 Mixed hyperlipidemia: Secondary | ICD-10-CM

## 2016-01-26 DIAGNOSIS — K219 Gastro-esophageal reflux disease without esophagitis: Secondary | ICD-10-CM

## 2016-01-26 DIAGNOSIS — F339 Major depressive disorder, recurrent, unspecified: Secondary | ICD-10-CM

## 2016-01-31 ENCOUNTER — Ambulatory Visit (INDEPENDENT_AMBULATORY_CARE_PROVIDER_SITE_OTHER): Payer: Commercial Managed Care - HMO | Admitting: *Deleted

## 2016-01-31 DIAGNOSIS — Z8673 Personal history of transient ischemic attack (TIA), and cerebral infarction without residual deficits: Secondary | ICD-10-CM

## 2016-02-01 NOTE — Progress Notes (Signed)
Carelink Summary Report / Loop Recorder 

## 2016-02-04 ENCOUNTER — Other Ambulatory Visit: Payer: Self-pay | Admitting: Nurse Practitioner

## 2016-02-04 DIAGNOSIS — E1140 Type 2 diabetes mellitus with diabetic neuropathy, unspecified: Secondary | ICD-10-CM

## 2016-02-07 ENCOUNTER — Other Ambulatory Visit: Payer: Self-pay | Admitting: *Deleted

## 2016-02-07 ENCOUNTER — Other Ambulatory Visit: Payer: Self-pay

## 2016-02-07 ENCOUNTER — Encounter: Payer: Self-pay | Admitting: *Deleted

## 2016-02-07 DIAGNOSIS — I10 Essential (primary) hypertension: Secondary | ICD-10-CM

## 2016-02-07 NOTE — Patient Outreach (Signed)
[facility]) Care Management  02/07/2016  Kara Mendoza 11/27/1950 [id number]   RN spoke with pt today and introduced the [facility] program and services (pt receptive). Pt mentioned all her needs as RN addressed with different [facility] members that can assist further. Several issues discussed along with several community resources. Referred received with several issues as RN attempted to address one issues at a time that was most importance. RN inquired on pt's medical issues initially: DIABETES:  Pt reports she is unable to take her BS due to the limited motor skill after her stoke with her right and left hand. States her provider is aware and her last A1C was 5.9. This medical condition is under control based upon her follow appointments with her provider at this time. RN continues to stress the importance of healthy eating habits to maintain control of her diabetes (pt with clear understanding). Atrial Fibrillation: Pt states she has a defibrillator and she connects to her Medtronic device regularly with no reported issues or abnormal read-outs to her providers office. RN continues to educate pt on FAST due to her history of strokes as pt receptive and verbalized an understanding.  HTN: Pt states her HTN is under control but pt is open to more teaching on this medical condition due to her history of stroke. RN offered a home visit for a more one-on-one consultation as pt receptive and a home visit was scheduled. Plan of care will be generated with this disease management involved. Other topics discussed today related to the following and RN will make the appropriate referrals accordingly: NUTRITION: Pt reports she is currently received at no cost to her mail ordered meals from [facility] but not sure how long this will occur. Reports her weight is maintain at 178 lbs.  SMOKING CESSATION: Pt states she is ready to quit and receptive to counseling at this time indicating a prescribed medication would  be preferred due to coverage on her [facility] prescription plan. RN strongly encouraged pt to discuss this with her provider on the upcoming appointment 1/24 and requested a smoking cessation medication appropriate for her at this time. HX DEPRESSION:  Pt states she is doing "okay": but would like a psychiatrist for future counseling. RN again encouraged pt to discuss this with her provider for a referral based upon her insurance carrier who may require pt to obtain a referral from her primary provider for a specialist provider. Pt will discussed with Dr.  Danella Maiers on the office appointment this month.  COMMUNITY SERVICES: Pt expressed having several agencies in the past to assist with aide services but the experience with this agencies were very bad even after her complaints to the agencies. Pt not aware if the agencies were with the CAPS program through [facility] however states she last spoke with [facility] who was suppose to work on getting her 80 hours of weekly services but never got a call or follow up concerning the outcome. Pt has requested assistance with this follow up or another referral for CAPS services if possible at this time. Pt states not only does she needs aide services she is in need of cleaning services as well but again has no funds to cover any incurred changes for either services.  SAFETY: Pt states she uses a cane when ambulating however has a rollator but unable to maneuver this device again due to limited motor skills to her hands. Pt has indicated several falls but no major injuries. Level of care discussed with  pt based upon her limited support system and she lives alone.  MEDICATIONS: Pt states she does not take all her medications if she feels the side effects make her at risk of bleeding and if she does not know why she is taking the medications. RN reviewed pt's medications and found pt not take several as pt not aware of the refills and different location of the available refill medications.  RN attempted to education pt on her medications however a referral has been ordered for a pharmacy consult via [facility].   RN verified pt lives on the 2nd floor but has two different ways to take her dog outside with less steps on side of her apartment. Pt states she still drives herself to and from her medical appointments with no problems at this time. Pt states her social life is good with singing in the choir at her local church on Tuesday and Thursday with practice on theses days. Based upon the information provided above and pt's receptiveness to continue [facility] will scheduled a home visit based upon pt's schedule and continue case management services related to ongoing education and increase knowledge based related to pt's HTN. Will also provide update to the assigned social worker and pharmacy concerning medical procurement and adherence to her prescribed medication ( pt receptive to these referrals)  Elliot CousinLisa Jaedin [name], RN Care Management Coordinator [facility] Main Office [phone]
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
## 2016-02-10 ENCOUNTER — Telehealth: Payer: Self-pay | Admitting: Nurse Practitioner

## 2016-02-10 NOTE — Telephone Encounter (Signed)
Pt stated that she can't climb stairs and she is unable to leave her home because she lives on the second floor.  Her apartment complex is asking that you send a notification that pt needs a first floor apartment and fax to [address] - fax # [phone] Please contact pt and let her know

## 2016-02-10 NOTE — Telephone Encounter (Signed)
Spoke with pt, appt set for 02/13/2016 for this.

## 2016-02-11 LAB — CUP PACEART REMOTE DEVICE CHECK
Date Time Interrogation Session: 20171125011030
Implantable Pulse Generator Implant Date: 20150708

## 2016-02-11 NOTE — Progress Notes (Signed)
Carelink summary report received. Battery status OK. Normal device function. No new symptom episodes, tachy episodes, brady, or pause episodes. No new AF episodes. Monthly summary reports and ROV/PRN 

## 2016-02-13 ENCOUNTER — Ambulatory Visit (INDEPENDENT_AMBULATORY_CARE_PROVIDER_SITE_OTHER): Payer: Medicare HMO | Admitting: Nurse Practitioner

## 2016-02-13 ENCOUNTER — Encounter: Payer: Self-pay | Admitting: Nurse Practitioner

## 2016-02-13 VITALS — BP 130/64 | HR 86 | Temp 98.8°F | Resp 16 | Ht 62.0 in | Wt 179.1 lb

## 2016-02-13 DIAGNOSIS — I69993 Ataxia following unspecified cerebrovascular disease: Secondary | ICD-10-CM | POA: Diagnosis not present

## 2016-02-13 DIAGNOSIS — R531 Weakness: Secondary | ICD-10-CM | POA: Diagnosis not present

## 2016-02-13 DIAGNOSIS — I10 Essential (primary) hypertension: Secondary | ICD-10-CM | POA: Diagnosis not present

## 2016-02-13 DIAGNOSIS — G8110 Spastic hemiplegia affecting unspecified side: Secondary | ICD-10-CM

## 2016-02-13 DIAGNOSIS — E1142 Type 2 diabetes mellitus with diabetic polyneuropathy: Secondary | ICD-10-CM | POA: Diagnosis not present

## 2016-02-13 DIAGNOSIS — R471 Dysarthria and anarthria: Secondary | ICD-10-CM | POA: Diagnosis not present

## 2016-02-13 NOTE — Progress Notes (Signed)
Pre visit review using our clinic review tool, if applicable. No additional management support is needed unless otherwise documented below in the visit note. 

## 2016-02-13 NOTE — Progress Notes (Signed)
Subjective:  Patient ID: Kara Mendoza, female    DOB: May 03, 1950  Age: 66 y.o. MRN: [id number]  CC: Follow-up (needs letter to be moved from 2nd to 1st floor due to legs giving out and falling)  HPI Kara Mendoza presents today for letter to her Apartment complex to relocate to first floor due to mobility difficulty and repeated falls. Sh e has total of 10 stairs to climb from packing lot. She has difficulty maintian her ADLs (like carrying groceries into her home), and has limited her activities due to fear of falling.  had physical therapy for 3month after CVA (inpatient and outpatient).  She is also requesting for Home Health Nurse aide and repeat PT/OT to improve ADLs. She is currently driving herself.  Fall Risk  02/07/2016 01/23/2016 12/23/2014 05/01/2012 04/30/2012  Falls in the past year? Yes No Yes Yes Yes  Number falls in past yr: 2 or more - 2 or more 2 or more 2 or more  Injury with Fall? No - - No Yes  Risk Factor Category  - - High Fall Risk High Fall Risk High Fall Risk  Risk for fall due to : Impaired balance/gait;History of fall(s) - History of fall(s);Impaired balance/gait History of fall(s);Impaired balance/gait;Impaired vision Impaired balance/gait  Risk for fall due to (comments): - - - - Uses a cane  Follow up Falls prevention discussed - - - -   Outpatient Medications Prior to Visit  Medication Sig Dispense Refill  . acetaminophen (TYLENOL) 500 MG tablet Take 2 tablets (1,000 mg total) by mouth every 6 (six) hours as needed. 30 tablet 0  . albuterol (PROVENTIL HFA;VENTOLIN HFA) 108 (90 Base) MCG/ACT inhaler Inhale 2 puffs into the lungs every 6 (six) hours as needed for wheezing or shortness of breath. 1 Inhaler 2  . atorvastatin (LIPITOR) 40 MG tablet TAKE 1 TABLET(40 MG) BY MOUTH DAILY AT 6 PM 90 tablet 2  . clopidogrel (PLAVIX) 75 MG tablet Take 1 tablet (75 mg total) by mouth daily. 90 tablet 3  . diclofenac sodium (VOLTAREN) 1 % GEL Apply topically 4 (four) times  daily.    . DULoxetine (CYMBALTA) 20 MG capsule TAKE 1 CAPSULE(20 MG) BY MOUTH DAILY 90 capsule 2  . DULoxetine (CYMBALTA) 60 MG capsule Take 60 mg by mouth daily.    Marland Kitchen gabapentin (NEURONTIN) 300 MG capsule Take 300 mg by mouth 3 (three) times daily.    . Glucose Blood (BLOOD GLUCOSE TEST STRIPS) STRP 1 each by Other route as needed for Other. Use as instructed    . losartan-hydrochlorothiazide (HYZAAR) 100-25 MG tablet TAKE 1 TABLET BY MOUTH DAILY 90 tablet 2  . metFORMIN (GLUCOPHAGE) 500 MG tablet TAKE 1 TABLET(500 MG) BY MOUTH DAILY WITH BREAKFAST 90 tablet 1  . Multiple Vitamins-Calcium (ONE-A-DAY WOMENS FORMULA PO) Take 1 tablet by mouth daily.    Marland Kitchen omeprazole (PRILOSEC) 20 MG capsule TAKE 1 CAPSULE(20 MG) BY MOUTH TWICE DAILY 180 capsule 1  . sertraline (ZOLOFT) 50 MG tablet TAKE 1 TABLET(50 MG) BY MOUTH AT BEDTIME 90 tablet 2  . zolpidem (AMBIEN) 5 MG tablet Take 1 tablet (5 mg total) by mouth at bedtime as needed for sleep. 30 tablet 2  . methimazole (TAPAZOLE) 5 MG tablet Take 1 tablet (5 mg total) by mouth daily. (Patient not taking: Reported on 02/13/2016) 30 tablet 1   No facility-administered medications prior to visit.     ROS See HPI  Objective:  BP 130/64   Pulse 86  Temp 98.8 F (37.1 C) (Oral)   Resp 16   Ht 5\' 2"  (1.575 m)   Wt 179 lb 1.9 oz (81.2 kg)   SpO2 96%   BMI 32.76 kg/m   BP Readings from Last 3 Encounters:  02/13/16 130/64  12/08/15 139/74  12/07/15 (!) 152/84    Wt Readings from Last 3 Encounters:  02/13/16 179 lb 1.9 oz (81.2 kg)  01/23/16 178 lb (80.7 kg)  12/07/15 179 lb (81.2 kg)    Physical Exam  Constitutional: She is oriented to person, place, and time. No distress.  Cardiovascular: Normal rate and normal heart sounds.   Musculoskeletal: She exhibits tenderness. She exhibits no edema or deformity.  Neurological: She is alert and oriented to person, place, and time. She displays no tremor. Gait abnormal.  Ataxic gait with cane.  4/5  muscle strength in right arm and bilateral Le. 3/5 muscle strength in left arm. hypersensitvity of left arm.  Skin: Skin is warm and dry.  Vitals reviewed.   Lab Results  Component Value Date   WBC 14.7 (H) 12/07/2015   HGB 14.3 12/07/2015   HCT 42.0 12/07/2015   PLT 379 12/07/2015   GLUCOSE 146 (H) 12/07/2015   CHOL 225 (H) 11/29/2015   TRIG 126.0 11/29/2015   HDL 46.70 11/29/2015   LDLCALC 154 (H) 11/29/2015   ALT 23 12/07/2015   AST 20 12/07/2015   NA 137 12/07/2015   K 4.7 12/07/2015   CL 101 12/07/2015   CREATININE 0.80 12/07/2015   BUN 8 12/07/2015   CO2 27 12/07/2015   TSH 0.36 12/05/2015   INR 0.93 12/07/2015   HGBA1C 5.9 11/29/2015    Ct Head Wo Contrast  Result Date: 12/07/2015 CLINICAL DATA:  Headache with dizziness EXAM: CT HEAD WITHOUT CONTRAST TECHNIQUE: Contiguous axial images were obtained from the base of the skull through the vertex without intravenous contrast. COMPARISON:  MRI 08/10/2013, CT scan brain 08/09/2013 FINDINGS: Brain: Again visualized is hypodensity within the left posterior parietal lobe, unchanged and felt consistent with chronic area of infarction. There is no acute territorial infarction or intracranial hemorrhage. There is no focal mass, mass effect or midline shift. The ventricles are similar in size and morphology. Vascular: No hyperdense vessels. Mild carotid artery calcifications. Skull: Mastoid air cells are clear.  There is no fracture. Sinuses/Orbits: Minimal mucosal thickening in the sphenoid and ethmoid sinuses. No acute orbital abnormality. Other: None IMPRESSION: Chronic appearing left parietal infarct. No definite acute intracranial abnormality Electronically Signed   By: Kara Mendoza M.D.   On: 12/07/2015 23:44    Assessment & Plan:   Kara Mendoza was seen today for follow-up.  Diagnoses and all orders for this visit:  Ataxia, late effect of cerebrovascular disease -     Ambulatory referral to Home Health  Type 2 diabetes  mellitus with diabetic polyneuropathy, without long-term current use of insulin (HCC) -     Ambulatory referral to Home Health -     Ambulatory referral to Ophthalmology -     Ambulatory referral to Podiatry  Spastic hemiplegia affecting dominant side (HCC) -     Ambulatory referral to Home Health  Weakness generalized -     Ambulatory referral to Home Health  Essential hypertension -     Ambulatory referral to Home Health  Dysarthria -     Ambulatory referral to Home Health   I am having Ms. Rabago maintain her Multiple Vitamins-Calcium (ONE-A-DAY WOMENS FORMULA PO), methimazole, zolpidem, diclofenac sodium, albuterol, BLOOD  GLUCOSE TEST STRIPS, acetaminophen, gabapentin, clopidogrel, DULoxetine, losartan-hydrochlorothiazide, atorvastatin, DULoxetine, omeprazole, sertraline, and metFORMIN.  No orders of the defined types were placed in this encounter.   Follow-up: Return in about 3 months (around 05/13/2016) for DM and HTN, hyperlipidemia (CPE, fasting).  Alysia Pennaharlotte Lizzeth Meder, NP

## 2016-02-15 ENCOUNTER — Other Ambulatory Visit: Payer: Self-pay | Admitting: Pharmacist

## 2016-02-15 ENCOUNTER — Other Ambulatory Visit: Payer: Self-pay | Admitting: *Deleted

## 2016-02-15 ENCOUNTER — Telehealth: Payer: Self-pay | Admitting: Nurse Practitioner

## 2016-02-15 NOTE — Patient Outreach (Signed)
[facility]) Care Management  02/15/2016  Kara HammockSheila L Mendoza 18-Apr-1950 [id number]   CSW was able to make initial contact with patient today to assess and assist with social work needs and services.  CSW introduced self, explained role and types of services provided through [facility] Care Management).  CSW further explained to patient that CSW works with patient's RNCM, also with [facility], Elliot CousinLisa Matthews. CSW then explained the reason for the call, indicating that [name] thought that patient would benefit from social work services and resources to assist with referrals to various community agencies and resources.  CSW obtained two HIPAA compliant identifiers from patient, which included patient's name and date of birth. Patient admitted that she needs help filling out certain paperwork, as her "sight is not all that good".  CSW agreed to schedule a home visit with patient to assist with completion of paperwork, as well as assess for additional social work needs, at that time.  CSW was able to schedule the initial home visit with patient for Tuesday, January 16th at 3:00pm.  Patient took down CSW's contact information and was encouraged to contact CSW directly if she needs assistance in the meantime. Danford BadJoanna Allia Wiltsey, BSW, MSW, LCSW  Licensed Restaurant manager, fast foodClinical Social Worker  [facility]  [place], [place], [place] [address] Physical Address-[address], [place], [place] [address] Toll Free Main # [phone] Fax # [phone] Cell # [phone]  Fax # [phone]  [email or web address]

## 2016-02-15 NOTE — Patient Outreach (Signed)
Kara HammockSheila L Mendoza is a 66 y.o. female referred to pharmacy for medication adherence and medication affordability. Called and spoke with patient. HIPAA identifiers verified and verbal consent received.  [name] reports that she is currently having difficulty with affording her medications as she has just moved and her current apartment rent is expensive. Reports that she has extra help with the cost of her medications. However, reports that the total cost is still difficult for her to afford given the cost of her housing. Patient states that she currently has all of her medications. Patient is agreeable to a home visit from pharmacy. Let patient know that pharmacist Kara Mendoza is available to join [facility] Kara Mendoza as a co-visit on Friday, 02/17/16 at 1 pm. [name] accepts and confirms the appointment time.  Patient also expresses interest in speaking with a Kara psychotherapistsocial Mendoza. Let patient know that a referral has been placed to our Kara Mendoza and that she will be reaching out to the patient shortly.  [name] states that she has no medication questions/concerns for me at this time. Provide patient with my phone number.  Kara Mendoza, PharmD, [facility] Clinical Pharmacist [facility] [phone]

## 2016-02-16 DIAGNOSIS — E1140 Type 2 diabetes mellitus with diabetic neuropathy, unspecified: Secondary | ICD-10-CM | POA: Diagnosis not present

## 2016-02-16 DIAGNOSIS — I69393 Ataxia following cerebral infarction: Secondary | ICD-10-CM | POA: Diagnosis not present

## 2016-02-16 DIAGNOSIS — Z7984 Long term (current) use of oral hypoglycemic drugs: Secondary | ICD-10-CM | POA: Diagnosis not present

## 2016-02-16 DIAGNOSIS — Z7902 Long term (current) use of antithrombotics/antiplatelets: Secondary | ICD-10-CM | POA: Diagnosis not present

## 2016-02-16 DIAGNOSIS — I69322 Dysarthria following cerebral infarction: Secondary | ICD-10-CM | POA: Diagnosis not present

## 2016-02-16 DIAGNOSIS — I69352 Hemiplegia and hemiparesis following cerebral infarction affecting left dominant side: Secondary | ICD-10-CM | POA: Diagnosis not present

## 2016-02-16 DIAGNOSIS — I10 Essential (primary) hypertension: Secondary | ICD-10-CM | POA: Diagnosis not present

## 2016-02-17 ENCOUNTER — Encounter: Payer: Self-pay | Admitting: *Deleted

## 2016-02-17 ENCOUNTER — Other Ambulatory Visit: Payer: Self-pay | Admitting: *Deleted

## 2016-02-17 ENCOUNTER — Other Ambulatory Visit: Payer: Self-pay | Admitting: Pharmacist

## 2016-02-17 NOTE — Patient Outreach (Signed)
[facility]) Care Management  [facility]   02/17/2016  Marolyn HammockSheila L Spires 09/30/1950 [id number]  Late entry for 02/17/16.  Subjective:  [facility] by [facility] RN Telephonic, Pietro Cassisavina due to "Patient states she is unable to afford her medications and is not taking her medications correctly."  Home visit to patient's residence completed 02/17/16 with [facility] RN [facility] Lisa, and also [facility] Pharm[facility], Jill Sideolleen.    Patient's PMH is significant for:  hypertension, atrial fibrillation, GERD, diabetes, hyperlipidemia, history of CVA.    Patient states that she uses a pill planner and was willing to review her medications with [facility].  She states that she has difficulty affording some of her medication co-pays.    Her pill box has slots for AM, Noon, Eve, and Bed---she reports she fills two weeks of morning medications in AM and Eve slots.    Objective:   Encounter Medications: Outpatient Encounter Prescriptions as of 02/17/2016  Medication Sig Note  . acetaminophen (TYLENOL) 500 MG tablet Take 2 tablets (1,000 mg total) by mouth every 6 (six) hours as needed. (Patient not taking: Reported on 02/17/2016) 12/07/2015: Only used occasionally  . albuterol (PROVENTIL HFA;VENTOLIN HFA) 108 (90 Base) MCG/ACT inhaler Inhale 2 puffs into the lungs every 6 (six) hours as needed for wheezing or shortness of breath.   Marland Kitchen. atorvastatin (LIPITOR) 40 MG tablet TAKE 1 TABLET(40 MG) BY MOUTH DAILY AT 6 PM   . clopidogrel (PLAVIX) 75 MG tablet Take 1 tablet (75 mg total) by mouth daily. 12/07/2015: Per the patient's explanation, she last took her Plavix 2-3 weeks ago. She was under the assumption her doctor was going to switch her from Plavix to Xarelto. That never occurred. Mrs. Tresa EndoKelly stated that her Plavix was re-filled (just) today and is awaiting pick up at her pharmacy. I called [facility] on [address] to verify this.  Marland Kitchen. diclofenac sodium (VOLTAREN) 1 % GEL Apply topically 4  (four) times daily.   . DULoxetine (CYMBALTA) 20 MG capsule TAKE 1 CAPSULE(20 MG) BY MOUTH DAILY   . DULoxetine (CYMBALTA) 60 MG capsule Take 60 mg by mouth daily.   Marland Kitchen. gabapentin (NEURONTIN) 300 MG capsule Take 300 mg by mouth 3 (three) times daily.   . Glucose Blood (BLOOD GLUCOSE TEST STRIPS) STRP 1 each by Other route as needed for Other. Use as instructed   . losartan-hydrochlorothiazide (HYZAAR) 100-25 MG tablet TAKE 1 TABLET BY MOUTH DAILY   . metFORMIN (GLUCOPHAGE) 500 MG tablet TAKE 1 TABLET(500 MG) BY MOUTH DAILY WITH BREAKFAST   . methimazole (TAPAZOLE) 5 MG tablet Take 1 tablet (5 mg total) by mouth daily. (Patient not taking: Reported on 02/17/2016)   . Multiple Vitamins-Calcium (ONE-A-DAY WOMENS FORMULA PO) Take 1 tablet by mouth daily.   Marland Kitchen. omeprazole (PRILOSEC) 20 MG capsule TAKE 1 CAPSULE(20 MG) BY MOUTH TWICE DAILY   . sertraline (ZOLOFT) 50 MG tablet TAKE 1 TABLET(50 MG) BY MOUTH AT BEDTIME   . zolpidem (AMBIEN) 5 MG tablet Take 1 tablet (5 mg total) by mouth at bedtime as needed for sleep. (Patient not taking: Reported on 02/17/2016)    No facility-administered encounter medications on file as of 02/17/2016.     Functional Status: In your present state of health, do you have any difficulty performing the following activities: 02/07/2016  Hearing? Y  Vision? N  Difficulty concentrating or making decisions? Y  Walking or climbing stairs? Y  Dressing or bathing? Y  Doing errands, shopping? N  Preparing Food and eating ? Y  Using the Toilet? N  In the past six months, have you accidently leaked urine? Y  Do you have problems with loss of bowel control? N  Managing your Medications? N  Managing your Finances? N  Housekeeping or managing your Housekeeping? Y  Some recent data might be hidden    Fall/Depression Screening: PHQ 2/9 Scores 02/07/2016 02/07/2016 01/23/2016 12/10/2014 05/01/2012 04/30/2012 04/15/2012  PHQ - 2 Score 1 4 5 5 6  0 1  PHQ- 9 Score - 13 17 19 14  - -     Assessment:   Drugs sorted by system:  Neurologic/Psychologic: -duloxetine 60 mg---patient reports she is not using this strength  -duloxetine 20 mg  -gabapentin -sertraline---patient reports she is not taking this medication -zolpidem---patient reports she is not taking this medication  Cardiovascular: -atorvastatin---patient reports she has not been taking lately  -clopidogrel  -losartan/hydrochlorothiazide  Pulmonary/Allergy: -albuterol inhaler  Gastrointestinal: -omeprazole  Endocrine: -metformin  Topical: -diclofenac gel  Pain: -acetaminophen as needed  Vitamins/Minerals: -multivitamin  Duplications in therapy:  -duloxetine 20 mg (filled 01/2016) and duloxetine 60 mg (filled 09/2015)----patient reports she is only taking duloxetine 60 mg -setraline 50 mg filled 01/2016 for 90 day supply---patient reports she is not yet taking    Drug interactions:  -Patient has two strengths of duloxetine on medication list and sertraline  -SSRIs or SNRIs may increase anti-platelet properties of clopidogrel---suggest monitoring patient for increased risk of bleeding with concomitant therapy.   Other issues noted:  -Gabapentin 600 mg three times daily and gabapentin 300 mg in patient's possession----she reports only taking 300 mg but does not have a current prescription bottle, 300 mg three times daily was in her pill box and identified using drug information resources.    -Atorvastatin---patient reports not taking because she did not know what it was for---counseled patient on medication and to report unusual muscle aches/pains to PCP---she agreed to try to take it more consistently.   Patient has multiple empty prescription bottles, she agreed to removal of the empty bottles.   She had old prescriptions of diclofenac tabs and losartan---she allowed for these medications to be disposed [facility] at the [facility].   Review of pill box found loose pregabalin  capsules (patient reports she is no longer using) as well as loose tabs oxycodone/acetaminophen 10/325 mg and oxycodone/acetaminophen 7.5/325 mg.  She was counseled to only take medications currently prescribed by her providers.    Medication assistance: Patient appears to have Medicare/Medicare---90 day supplies of maintenance medications may reduce her out-of-pocket prescription costs.  Her out-of-pocket prescription co-pays are likely as low as they can be at this time.    Plan:  Will route this note to PCP as there are multiple medication discrepancies for prescriber to review with patient.  Will place follow-up call to patient in the next 2 weeks---follow-up may be completed by [facility] PharmD, Jill Side.    Will route barriers letter to PCP.    Tommye Standard, PharmD, [facility] Clinical Pharmacist [facility] [phone]

## 2016-02-17 NOTE — Patient Outreach (Signed)
[facility]) Care Management   02/17/2016  Kara Mendoza 07-Mar-1950 [id number]  Kara Mendoza is an 66 y.o. female Pt interested in enrolling into the [facility] program and services Subjective: HTN: Pt interested in ongoing education on HTN and receptive to printed material as discussed telephonically for today's visit. Pt reports her history a stroke and the residual affects limiting her mobility.  MEDICATION: Pt alittle confused on her medications but is pending a joint visit with [facility] pharmacy today to review all medications along with education.  MEDICAL APPOINTMENTS: Pt reports she drives herself to and from all her appointments withno reported problems. States she wraps her arm up to prevent the increase pain from her neuropathy. COMMUNITY RESOURCES: Pt interested in increase hours with a home aide services. Pt will follow with [facility] concerning the pending CAPS increase hours with this agency. Pt does not have a clear understanding concerning her MCD services.   Objective:   Review of Systems  Constitutional: Negative.   HENT: Negative.   Eyes: Negative.   Respiratory: Negative.   Cardiovascular: Negative.   Gastrointestinal: Negative.   Genitourinary: Negative.   Skin: Negative.   Neurological: Negative.   Endo/Heme/Allergies: Negative.   Psychiatric/Behavioral: Negative.     Physical Exam  Constitutional: She is oriented to person, place, and time. She appears well-developed and well-nourished.  HENT:  Right Ear: External ear normal.  Left Ear: External ear normal.  Eyes: EOM are normal.  Neck: Normal range of motion.  Cardiovascular: Normal heart sounds.   Respiratory: Effort normal and breath sounds normal.  GI: Soft. Bowel sounds are normal.  Musculoskeletal: Normal range of motion.  Neuropathy history to her left arm  Neurological: She is alert and oriented to person, place, and time.  Skin: Skin is warm and dry.  Psychiatric: She has a  normal mood and affect. Her behavior is normal. Judgment and thought content normal.    Encounter Medications:   Outpatient Encounter Prescriptions as of 02/17/2016  Medication Sig Note  . albuterol (PROVENTIL HFA;VENTOLIN HFA) 108 (90 Base) MCG/ACT inhaler Inhale 2 puffs into the lungs every 6 (six) hours as needed for wheezing or shortness of breath.   Marland Kitchen atorvastatin (LIPITOR) 40 MG tablet TAKE 1 TABLET(40 MG) BY MOUTH DAILY AT 6 PM   . clopidogrel (PLAVIX) 75 MG tablet Take 1 tablet (75 mg total) by mouth daily. 12/07/2015: Per the patient's explanation, she last took her Plavix 2-3 weeks ago. She was under the assumption her doctor was going to switch her from Plavix to Xarelto. That never occurred. Mrs. Kara Mendoza stated that her Plavix was re-filled (just) today and is awaiting pick up at her pharmacy. I called [facility] on [address] to verify this.  Marland Kitchen diclofenac sodium (VOLTAREN) 1 % GEL Apply topically 4 (four) times daily.   . DULoxetine (CYMBALTA) 20 MG capsule TAKE 1 CAPSULE(20 MG) BY MOUTH DAILY   . Glucose Blood (BLOOD GLUCOSE TEST STRIPS) STRP 1 each by Other route as needed for Other. Use as instructed   . losartan-hydrochlorothiazide (HYZAAR) 100-25 MG tablet TAKE 1 TABLET BY MOUTH DAILY   . metFORMIN (GLUCOPHAGE) 500 MG tablet TAKE 1 TABLET(500 MG) BY MOUTH DAILY WITH BREAKFAST   . Multiple Vitamins-Calcium (ONE-A-DAY WOMENS FORMULA PO) Take 1 tablet by mouth daily.   Marland Kitchen omeprazole (PRILOSEC) 20 MG capsule TAKE 1 CAPSULE(20 MG) BY MOUTH TWICE DAILY   . sertraline (ZOLOFT) 50 MG tablet TAKE 1 TABLET(50 MG) BY MOUTH AT BEDTIME   .  acetaminophen (TYLENOL) 500 MG tablet Take 2 tablets (1,000 mg total) by mouth every 6 (six) hours as needed. (Patient not taking: Reported on 02/17/2016) 12/07/2015: Only used occasionally  . DULoxetine (CYMBALTA) 60 MG capsule Take 60 mg by mouth daily.   Marland Kitchen gabapentin (NEURONTIN) 300 MG capsule Take 300 mg by mouth 3 (three) times daily.   . methimazole  (TAPAZOLE) 5 MG tablet Take 1 tablet (5 mg total) by mouth daily. (Patient not taking: Reported on 02/17/2016)   . zolpidem (AMBIEN) 5 MG tablet Take 1 tablet (5 mg total) by mouth at bedtime as needed for sleep. (Patient not taking: Reported on 02/17/2016)    No facility-administered encounter medications on file as of 02/17/2016.     Functional Status:   In your present state of health, do you have any difficulty performing the following activities: 02/07/2016  Hearing? Y  Vision? N  Difficulty concentrating or making decisions? Y  Walking or climbing stairs? Y  Dressing or bathing? Y  Doing errands, shopping? N  Preparing Food and eating ? Y  Using the Toilet? N  In the past six months, have you accidently leaked urine? Y  Do you have problems with loss of bowel control? N  Managing your Medications? N  Managing your Finances? N  Housekeeping or managing your Housekeeping? Y  Some recent data might be hidden    Fall/Depression Screening:    PHQ 2/9 Scores 02/07/2016 02/07/2016 01/23/2016 12/10/2014 05/01/2012 04/30/2012 04/15/2012  PHQ - 2 Score 1 4 5 5 6  0 1  PHQ- 9 Score - 13 17 19 14  - -  BP 110/72 (BP Location: Right Arm, Patient Position: Sitting, Cuff Size: Normal)   Pulse 86   Resp 20   Ht 1.575 m (5\' 2" )   Wt 179 lb (81.2 kg)   SpO2 99%   BMI 32.74 kg/m   Assessment:   Introduction to the [facility] program and services Case management related to HTN Adherence with medications Adherence with medical appointments Community resources related to Child psychotherapist referral  Plan:  Will completed a physical appointment today and report any abnormal findings. Will enroll pt into the [facility] program via signed consent and reintroduce the purpose of the [facility] program and services. Will discussed HTN and educate pt on the [facility] printed material that will be reviewed today for the following: About High Blood Pressure, High Blood Pressure: Health Problem and Keeping an eye on your Blood Pressure. Will  also review HTN definition and the education information via [facility] calendar for future documentation to record all BP readings for her providers to view in the [facility] calendar.  Will verify pt has a understanding of the current medication that the [facility] pharmacy has reviewed with pt today along with the medications in her pill boxes. Kevin([facility] pharmacy) will work with pt today on any discrepancies concerning her current medications. Will verify pt's has sufficient transportation to all her medical appointments (pt verified) however will provide additional community resources for transportation sources.  Will verify Ruby Cola (soical worker [facility]) has contacted pt and arranged a home visit for this month. Will inform pt to discussed all issues concerning MCD services related to aide, cleaning services and possible resources to assist pt further while in the home alone.  Plan of care review and discussed with pt today along with the goals that were discussed as pt agreed with the plan of care with no problems. Will send primary provider today home visit report and schedule another  home visit in a few weeks for next month however if RN case management needed prior to this scheduled appointment pt aware to contact this RN case manager for a sooner appointment.   Elliot CousinLisa Matthews, RN Care Management Coordinator [facility] Main Office [phone]

## 2016-02-21 ENCOUNTER — Telehealth: Payer: Self-pay | Admitting: Nurse Practitioner

## 2016-02-21 ENCOUNTER — Encounter: Payer: Self-pay | Admitting: Pharmacist

## 2016-02-21 ENCOUNTER — Other Ambulatory Visit: Payer: Self-pay | Admitting: *Deleted

## 2016-02-21 ENCOUNTER — Encounter: Payer: Self-pay | Admitting: *Deleted

## 2016-02-21 NOTE — Telephone Encounter (Signed)
Charlotte please advise for the verbal request.   Left Ligi vm for her to call back about meds.

## 2016-02-21 NOTE — Patient Outreach (Signed)
[facility]) Care Management  02/21/2016  Marolyn HammockSheila L Foot March 21, 1950 [id number]   CSW was able to meet with patient today for the initial home visit.  CSW performed the initial assessment, as well as assessed and assisted with social work needs and services.  CSW introduced self, explained role and types of services provided through [facility] Care Management [facility] Care Management).  CSW further explained to patient that CSW works with patient's RNCM, also with [facility] Care Management, Elliot CousinLisa Matthews. CSW then explained the reason for the visit, indicating that [name] thought that patient would benefit from social work services and resources to assist with referrals to various community agencies and resources.  CSW obtained two HIPAA compliant identifiers from patient, which included patient's name and date of birth. Patient spoke a great deal about her medical conditions and how they limit her ability to climb stairs, read material, complete necessary paperwork, concentrate for any length of time, perform light housekeeping duties, drive, etc.  Patient voiced a great deal of concern about needing aide services in the home to assist with activities of daily living.  CSW obtained a CAPS Management consultant(Community Assistance Program Services) application for patient and agreed to assist with completion and submission.  The application is 10 pages and typed in very small print, which makes it difficult for patient to read and complete independently.  CSW will complete the application, fax, as well as mail the original to the [facility] [place] of Social Services for processing, then mail a copy to patient for her records. Patient then voiced concern about her Sport and exercise psychologistLife Insurance Policy through [employer].  Patient believes she is being scammed out of her funds, reporting that she continues to receive calls from their Customer Service Department, indicating that she owes for back payment and that  it must be paid within a certain period of time or patient's policy will be termed.  Patient indicated that she has never been late on a monthly payment, which is now what they are claiming.  Patient has proof that she has made her monthly premiums, in the amount of $69.00, since taking out the policy.  CSW agreed to contact [employer] to investigate further. Last, patient is interested in receiving social work assistance applying for Section 8 Housing through the [employer] [employer].  CSW agreed to request an application for Section 8 Housing, assist with completion and submit directly to the [employer] [employer] for processing.  In addition, CSW will contact management at [facility] [phone](# [phone]) to request that patient be transferred into a ground floor apartment, due to medical necessity.  Patient has resided at the [facility], [facility], for 5 years so she is hopeful that management will be willing to work with her.  If not, CSW will contact [employer], as well as contact Americans with Disabilities Act to request legal aid. Patient admits to having a very limited support system, feeling that she is "all alone in the world to fend for herself".  CSW offered counseling and supportive services, where appropriate.  Patient denied being interested in receiving a referral for counseling services or needing symptom relief for Depressive mood.  Patient became tearful when talking with CSW about how she feels she has been taken advantage of by "people that were supposed to help her".  Patient currently receives Medicare primary and Adult Medicaid secondary.  Patient also receives [employer] Stamps, but was not interested in receiving Mobile Meals through  [employer].  Last, patient has her own vehicle and pays the monthly insurance premiums, but is unable to drive herself.  Patient uses [employer], as well  as [employer] Geneticist, molecular) to get to and from her physician appointments.  CSW agreed to follow-up with patient in one week to report findings of progress made with all of the above named information.  Patient voiced understanding and was agreeable to this plan. Danford Bad, BSW, MSW, LCSW  Licensed Restaurant manager, fast food Health System  Mailing [place] [address] [place], [place] [address] Physical Address-[address], [place], [place] [address] Toll Free Main # [phone] Fax # [phone] Cell # [phone]  Fax # [phone]  [email or web address]

## 2016-02-21 NOTE — Telephone Encounter (Signed)
Spoke with Kara Mendoza, she is from [facility], she did an evaluation for [name] and now she is requesting verbal order for plan of care for pt for PT, OT, social worker (this is to help with apartment issue that she has) and nurse aid to help. Please advise  Ligi also wanst to inform drug interaction for Clopidogrel, omeprazole and diclofenac sodium. FYI to PCP

## 2016-02-21 NOTE — Telephone Encounter (Signed)
Ligi from [facility] is needing verbal orders for PT, OT, social work and aid.   She also has a couple of infractions with some medications. Clopidogrel, omeprazole and diclofenac sodium  Can you please give her a call asap at [phone]

## 2016-02-21 NOTE — Telephone Encounter (Signed)
There is an order in the system for home health. Please look into the status of that order. Thank you

## 2016-02-23 NOTE — Telephone Encounter (Signed)
Home health order is not connected to patient's request to be moved to the first floor of her apartment.   Ligi will need to fax plan of care to the office to be signed.  Thank you

## 2016-02-24 NOTE — Progress Notes (Signed)
appt is made for [facility]/2016 [facility]

## 2016-02-24 NOTE — Progress Notes (Signed)
Patient will be scheduled for medication reconciliation. Thank you

## 2016-02-24 NOTE — Progress Notes (Signed)
Please schedule patient for medication reconciliation. Thank you

## 2016-02-24 NOTE — Telephone Encounter (Signed)
Spoke with Ligi, her company is faxing over the care plan for us to sigh.

## 2016-02-28 ENCOUNTER — Ambulatory Visit (INDEPENDENT_AMBULATORY_CARE_PROVIDER_SITE_OTHER): Payer: Medicare HMO | Admitting: *Deleted

## 2016-02-28 DIAGNOSIS — Z8673 Personal history of transient ischemic attack (TIA), and cerebral infarction without residual deficits: Secondary | ICD-10-CM

## 2016-02-29 ENCOUNTER — Encounter: Payer: Self-pay | Admitting: Nurse Practitioner

## 2016-02-29 ENCOUNTER — Other Ambulatory Visit: Payer: Self-pay | Admitting: *Deleted

## 2016-02-29 ENCOUNTER — Ambulatory Visit (INDEPENDENT_AMBULATORY_CARE_PROVIDER_SITE_OTHER): Payer: Medicare HMO | Admitting: Nurse Practitioner

## 2016-02-29 VITALS — BP 130/76 | HR 87 | Temp 98.5°F | Ht 62.0 in | Wt 175.0 lb

## 2016-02-29 DIAGNOSIS — R471 Dysarthria and anarthria: Secondary | ICD-10-CM | POA: Diagnosis not present

## 2016-02-29 DIAGNOSIS — M25562 Pain in left knee: Secondary | ICD-10-CM | POA: Diagnosis not present

## 2016-02-29 DIAGNOSIS — G5612 Other lesions of median nerve, left upper limb: Secondary | ICD-10-CM | POA: Diagnosis not present

## 2016-02-29 DIAGNOSIS — R51 Headache: Secondary | ICD-10-CM

## 2016-02-29 DIAGNOSIS — E1142 Type 2 diabetes mellitus with diabetic polyneuropathy: Secondary | ICD-10-CM | POA: Diagnosis not present

## 2016-02-29 DIAGNOSIS — G8929 Other chronic pain: Secondary | ICD-10-CM

## 2016-02-29 DIAGNOSIS — R519 Headache, unspecified: Secondary | ICD-10-CM

## 2016-02-29 DIAGNOSIS — G5640 Causalgia of unspecified upper limb: Secondary | ICD-10-CM

## 2016-02-29 MED ORDER — GABAPENTIN 300 MG PO CAPS: 300.0000 mg | ORAL_CAPSULE | Freq: Three times a day (TID) | ORAL | 1 refills | Status: DC

## 2016-02-29 MED ORDER — BLOOD GLUCOSE TEST VI STRP: 1.0000 [IU] | ORAL_STRIP | Freq: Three times a day (TID) | 3 refills | Status: AC

## 2016-02-29 MED ORDER — ONETOUCH DELICA LANCETS FINE MISC: 1.0000 [IU] | Freq: Three times a day (TID) | 3 refills | Status: AC

## 2016-02-29 NOTE — Progress Notes (Signed)
Pre visit review using our clinic review tool, if applicable. No additional management support is needed unless otherwise documented below in the visit note. 

## 2016-02-29 NOTE — Telephone Encounter (Signed)
Left vm for Ligi to refax over the care plan, we do not have it.
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
## 2016-02-29 NOTE — Progress Notes (Signed)
Subjective:  Patient ID: Kara Mendoza, female    DOB: 05-15-1950  Age: 66 y.o. MRN: [id number]  CC: Pain (arm and knee pain/req gabapentin capsult (not pill))   HPI  Chronic Pain (bilateral arms): Gabapentin tablets was prescribed by pain clinic with [facility]. Last seen 08/2015. She does not want to return to pain clinic. She want referral to neurologist who will consider surgery to treat underlying cause of her pain.  Due to difficulty swallowing large gabapentin tablet, she will like medication switched to capsules. Does not need cymbalta refilled.  Dm: Also requesting for glucometer lancets and strips refilled.  Outpatient Medications Prior to Visit  Medication Sig Dispense Refill  . acetaminophen (TYLENOL) 500 MG tablet Take 2 tablets (1,000 mg total) by mouth every 6 (six) hours as needed. 30 tablet 0  . albuterol (PROVENTIL HFA;VENTOLIN HFA) 108 (90 Base) MCG/ACT inhaler Inhale 2 puffs into the lungs every 6 (six) hours as needed for wheezing or shortness of breath. 1 Inhaler 2  . atorvastatin (LIPITOR) 40 MG tablet TAKE 1 TABLET(40 MG) BY MOUTH DAILY AT 6 PM 90 tablet 2  . clopidogrel (PLAVIX) 75 MG tablet Take 1 tablet (75 mg total) by mouth daily. 90 tablet 3  . diclofenac sodium (VOLTAREN) 1 % GEL Apply topically 4 (four) times daily.    . DULoxetine (CYMBALTA) 20 MG capsule TAKE 1 CAPSULE(20 MG) BY MOUTH DAILY 90 capsule 2  . DULoxetine (CYMBALTA) 60 MG capsule Take 60 mg by mouth daily.    Marland Kitchen losartan-hydrochlorothiazide (HYZAAR) 100-25 MG tablet TAKE 1 TABLET BY MOUTH DAILY 90 tablet 2  . metFORMIN (GLUCOPHAGE) 500 MG tablet TAKE 1 TABLET(500 MG) BY MOUTH DAILY WITH BREAKFAST 90 tablet 1  . Multiple Vitamins-Calcium (ONE-A-DAY WOMENS FORMULA PO) Take 1 tablet by mouth daily.    Marland Kitchen omeprazole (PRILOSEC) 20 MG capsule TAKE 1 CAPSULE(20 MG) BY MOUTH TWICE DAILY 180 capsule 1  . zolpidem (AMBIEN) 5 MG tablet Take 1 tablet (5 mg total) by mouth at bedtime as needed for sleep. 30  tablet 2  . gabapentin (NEURONTIN) 300 MG capsule Take 300 mg by mouth 3 (three) times daily.    . Glucose Blood (BLOOD GLUCOSE TEST STRIPS) STRP Inject 1 Units into the skin 4 (four) times daily -  before meals and at bedtime. OneTouch Ultra mini strips    . methimazole (TAPAZOLE) 5 MG tablet Take 1 tablet (5 mg total) by mouth daily. 30 tablet 1  . sertraline (ZOLOFT) 50 MG tablet TAKE 1 TABLET(50 MG) BY MOUTH AT BEDTIME 90 tablet 2   No facility-administered medications prior to visit.     ROS See HPI  Objective:  BP 130/76   Pulse 87   Temp 98.5 F (36.9 C)   Ht 5\' 2"  (1.575 m)   Wt 175 lb (79.4 kg)   SpO2 99%   BMI 32.01 kg/m   BP Readings from Last 3 Encounters:  02/29/16 130/76  02/17/16 110/72  02/13/16 130/64    Wt Readings from Last 3 Encounters:  02/29/16 175 lb (79.4 kg)  02/17/16 179 lb (81.2 kg)  02/13/16 179 lb 1.9 oz (81.2 kg)    Physical Exam  Lab Results  Component Value Date   WBC 14.7 (H) 12/07/2015   HGB 14.3 12/07/2015   HCT 42.0 12/07/2015   PLT 379 12/07/2015   GLUCOSE 146 (H) 12/07/2015   CHOL 225 (H) 11/29/2015   TRIG 126.0 11/29/2015   HDL 46.70 11/29/2015   LDLCALC 154 (  H) 11/29/2015   ALT 23 12/07/2015   AST 20 12/07/2015   NA 137 12/07/2015   K 4.7 12/07/2015   CL 101 12/07/2015   CREATININE 0.80 12/07/2015   BUN 8 12/07/2015   CO2 27 12/07/2015   TSH 0.36 12/05/2015   INR 0.93 12/07/2015   HGBA1C 5.9 11/29/2015    Ct Head Wo Contrast  Result Date: 12/07/2015 CLINICAL DATA:  Headache with dizziness EXAM: CT HEAD WITHOUT CONTRAST TECHNIQUE: Contiguous axial images were obtained from the base of the skull through the vertex without intravenous contrast. COMPARISON:  MRI 08/10/2013, CT scan brain 08/09/2013 FINDINGS: Brain: Again visualized is hypodensity within the left posterior parietal lobe, unchanged and felt consistent with chronic area of infarction. There is no acute territorial infarction or intracranial hemorrhage.  There is no focal mass, mass effect or midline shift. The ventricles are similar in size and morphology. Vascular: No hyperdense vessels. Mild carotid artery calcifications. Skull: Mastoid air cells are clear.  There is no fracture. Sinuses/Orbits: Minimal mucosal thickening in the sphenoid and ethmoid sinuses. No acute orbital abnormality. Other: None IMPRESSION: Chronic appearing left parietal infarct. No definite acute intracranial abnormality Electronically Signed   By: Jasmine Pang M.D.   On: 12/07/2015 23:44    Assessment & Plan:   [name] was seen today for pain.  Diagnoses and all orders for this visit:  Neuritis of left median nerve -     gabapentin (NEURONTIN) 300 MG capsule; Take 1 capsule (300 mg total) by mouth 3 (three) times daily. -     Ambulatory referral to Neurology  Complex regional pain syndrome type 2 of upper extremity, unspecified laterality -     gabapentin (NEURONTIN) 300 MG capsule; Take 1 capsule (300 mg total) by mouth 3 (three) times daily. -     Ambulatory referral to Neurology  Dysarthria -     gabapentin (NEURONTIN) 300 MG capsule; Take 1 capsule (300 mg total) by mouth 3 (three) times daily. -     Ambulatory referral to Neurology  Chronic pain of left knee -     gabapentin (NEURONTIN) 300 MG capsule; Take 1 capsule (300 mg total) by mouth 3 (three) times daily.  Type 2 diabetes mellitus with diabetic polyneuropathy, without long-term current use of insulin (HCC) -     ONETOUCH DELICA LANCETS FINE MISC; 1 Units by Does not apply route 4 (four) times daily -  before meals and at bedtime. -     Glucose Blood (BLOOD GLUCOSE TEST STRIPS) STRP; Inject 1 Units into the skin 4 (four) times daily -  before meals and at bedtime. OneTouch Ultra mini strips  Chronic nonintractable headache, unspecified headache type -     Ambulatory referral to Neurology   I have discontinued [name] methimazole and sertraline. I have also changed her gabapentin.  Additionally, I am having her start on [facility] DELICA LANCETS FINE. Lastly, I am having her maintain her Multiple Vitamins-Calcium (ONE-A-DAY WOMENS FORMULA PO), zolpidem, diclofenac sodium, albuterol, acetaminophen, clopidogrel, DULoxetine, losartan-hydrochlorothiazide, atorvastatin, DULoxetine, omeprazole, metFORMIN, and BLOOD GLUCOSE TEST STRIPS.  Meds ordered this encounter  Medications  . ONETOUCH DELICA LANCETS FINE MISC    Sig: 1 Units by Does not apply route 4 (four) times daily -  before meals and at bedtime.    Dispense:  100 each    Refill:  3    Order Specific Question:   Supervising Provider    Answer:   Tresa Garter [[id number]]  . gabapentin (NEURONTIN)  300 MG capsule    Sig: Take 1 capsule (300 mg total) by mouth 3 (three) times daily.    Dispense:  270 capsule    Refill:  1    Order Specific Question:   Supervising Provider    Answer:   Tresa GarterPLOTNIKOV, ALEKSEI V [[id number]]  . Glucose Blood (BLOOD GLUCOSE TEST STRIPS) STRP    Sig: Inject 1 Units into the skin 4 (four) times daily -  before meals and at bedtime. OneTouch Ultra mini strips    Dispense:  100 each    Refill:  3    Order Specific Question:   Supervising Provider    Answer:   Tresa GarterPLOTNIKOV, ALEKSEI V [[id number]]    Follow-up: Return in about 3 months (around 05/29/2016) for DM and HTN, hyperlipidemia (fasting).  Alysia Pennaharlotte Patrisia Faeth, NP

## 2016-02-29 NOTE — Patient Outreach (Signed)
[facility]) Care Management  02/29/2016  Marolyn HammockSheila L Hissong 31-Jan-1951 [id number]   Patient denies wanting treatment and/or referral sources for Depression, despite scoring an 8 on the PHQ9 test.   Danford BadJoanna Noam Franzen, BSW, MSW, LCSW  Licensed Clinical Social Worker  [facility] [place] [facility]  [address] [place], [place], [place] [address] Physical Address-[address], [place], [place] [address] Toll Free Main # [phone] Fax # [phone] Cell # [phone]  Office # [phone] [email or web address]

## 2016-02-29 NOTE — Progress Notes (Signed)
Carelink Summary Report / Loop Recorder 

## 2016-02-29 NOTE — Patient Instructions (Addendum)
maintain upcoming appt for CPE(fasting). Gabapentin capsules sent.  I advised patient to maintain appts with current pain clinic, while [facility].  She declined to maintain appt with current pain clinic. She was informed that I do not manage chronic pain, hence she will have to wait for appt with neurologist if she is needing pain medications adjustment. She verbalized understanding.

## 2016-03-01 ENCOUNTER — Ambulatory Visit: Payer: Commercial Managed Care - HMO | Admitting: Nurse Practitioner

## 2016-03-01 ENCOUNTER — Other Ambulatory Visit: Payer: Self-pay | Admitting: Pharmacist

## 2016-03-01 NOTE — Patient Outreach (Signed)
[facility]) Care Management  [facility] Pharmacy   03/01/2016  Kara Mendoza [birth year] [id number]  Subjective: 66 yo female referred to [facility] pharmacy for medication assistance.  Successful outreach phone call placed to patient to follow up on medication adherence and recent provider visit. HIPAA identifiers verified.  Patient states she is not having any difficulties obtaining her medications at this time.  She has used mail order in the past and is not currently interested in this service as she felt her medications did not arrive when needed.  Patient did inquire about obtaining a new knee brace with support rods on the sides and a scale.    Objective:   Encounter Medications: Outpatient Encounter Prescriptions as of 03/01/2016  Medication Sig Note  . acetaminophen (TYLENOL) 500 MG tablet Take 2 tablets (1,000 mg total) by mouth every 6 (six) hours as needed. 12/07/2015: Only used occasionally  . albuterol (PROVENTIL HFA;VENTOLIN HFA) 108 (90 Base) MCG/ACT inhaler Inhale 2 puffs into the lungs every 6 (six) hours as needed for wheezing or shortness of breath.   Marland Kitchen atorvastatin (LIPITOR) 40 MG tablet TAKE 1 TABLET(40 MG) BY MOUTH DAILY AT 6 PM   . clopidogrel (PLAVIX) 75 MG tablet Take 1 tablet (75 mg total) by mouth daily. 12/07/2015: Per the patient's explanation, she last took her Plavix 2-3 weeks ago. She was under the assumption her doctor was going to switch her from Plavix to Xarelto. That never occurred. Kara Mendoza stated that her Plavix was re-filled (just) today and is awaiting pick up at her pharmacy. I called [facility] on [address] to verify this.  Marland Kitchen diclofenac sodium (VOLTAREN) 1 % GEL Apply topically 4 (four) times daily.   . DULoxetine (CYMBALTA) 20 MG capsule TAKE 1 CAPSULE(20 MG) BY MOUTH DAILY   . gabapentin (NEURONTIN) 300 MG capsule Take 1 capsule (300 mg total) by mouth 3 (three) times daily.   . Glucose Blood (BLOOD GLUCOSE TEST STRIPS) STRP Inject 1 Units  into the skin 4 (four) times daily -  before meals and at bedtime. OneTouch Ultra mini strips (Patient taking differently: Inject 1 Units into the skin 4 (four) times daily -  before meals and at bedtime. OneTouch Ultra mini strips)   . losartan-hydrochlorothiazide (HYZAAR) 100-25 MG tablet TAKE 1 TABLET BY MOUTH DAILY   . metFORMIN (GLUCOPHAGE) 500 MG tablet TAKE 1 TABLET(500 MG) BY MOUTH DAILY WITH BREAKFAST   . Multiple Vitamins-Calcium (ONE-A-DAY WOMENS FORMULA PO) Take 1 tablet by mouth daily.   Marland Kitchen omeprazole (PRILOSEC) 20 MG capsule TAKE 1 CAPSULE(20 MG) BY MOUTH TWICE DAILY   . ONETOUCH DELICA LANCETS FINE MISC 1 Units by Does not apply route 4 (four) times daily -  before meals and at bedtime. (Patient taking differently: 1 Units by Does not apply route 4 (four) times daily -  before meals and at bedtime. )   . zolpidem (AMBIEN) 5 MG tablet Take 1 tablet (5 mg total) by mouth at bedtime as needed for sleep.   . [DISCONTINUED] DULoxetine (CYMBALTA) 60 MG capsule Take 60 mg by mouth daily.    No facility-administered encounter medications on file as of 03/01/2016.     Functional Status: In your present state of health, do you have any difficulty performing the following activities: 02/21/2016 02/07/2016  Hearing? [name]  Vision? N N  Difficulty concentrating or making decisions? [name]  Walking or climbing stairs? Y Y  Dressing or bathing? Y Y  Doing errands, shopping? [name]  Preparing Food and eating ? Y Y  Using the Toilet? Y N  In the past six months, have you accidently leaked urine? Y Y  Do you have problems with loss of bowel control? N N  Managing your Medications? N N  Managing your Finances? N N  Housekeeping or managing your Housekeeping? Malvin JohnsY Y  Some recent data might be hidden    Fall/Depression Screening: PHQ 2/9 Scores 02/21/2016 02/07/2016 02/07/2016 01/23/2016 12/10/2014 05/01/2012 04/30/2012  PHQ - 2 Score 2 1 4 5 5 6  0  PHQ- 9 Score 8 - 13 17 19 14  -    Assessment: Patient denies  any medication assistance needs at this time and states she is taking all her medications as scheduled.   Patient not interested in mail order services and is not eligible for any further reductions in copays as she is already receiving Full Help with Medicare / Medicaid.  Reviewed [employer] OTC catalogue with patient but scale and knee brace with rods not included.  Patient verbalized understanding to contact [employer] OTC for further OTC nformation if desired or to call [facility] pharmacy for any future medication needs.    Plan:  Pharmacy will sign-off case but will be happy to help in future as needed.    Haynes Hoehnolleen Janzen Sacks, PharmD, [facility] Clinical Pharmacist [facility] [phone]

## 2016-03-02 ENCOUNTER — Other Ambulatory Visit: Payer: Self-pay | Admitting: Nurse Practitioner

## 2016-03-06 DIAGNOSIS — I10 Essential (primary) hypertension: Secondary | ICD-10-CM | POA: Diagnosis not present

## 2016-03-06 DIAGNOSIS — I69352 Hemiplegia and hemiparesis following cerebral infarction affecting left dominant side: Secondary | ICD-10-CM | POA: Diagnosis not present

## 2016-03-06 DIAGNOSIS — I69322 Dysarthria following cerebral infarction: Secondary | ICD-10-CM | POA: Diagnosis not present

## 2016-03-06 DIAGNOSIS — Z7902 Long term (current) use of antithrombotics/antiplatelets: Secondary | ICD-10-CM | POA: Diagnosis not present

## 2016-03-06 DIAGNOSIS — E1140 Type 2 diabetes mellitus with diabetic neuropathy, unspecified: Secondary | ICD-10-CM | POA: Diagnosis not present

## 2016-03-06 DIAGNOSIS — Z7984 Long term (current) use of oral hypoglycemic drugs: Secondary | ICD-10-CM | POA: Diagnosis not present

## 2016-03-06 DIAGNOSIS — I69393 Ataxia following cerebral infarction: Secondary | ICD-10-CM | POA: Diagnosis not present

## 2016-03-07 DIAGNOSIS — Z7902 Long term (current) use of antithrombotics/antiplatelets: Secondary | ICD-10-CM | POA: Diagnosis not present

## 2016-03-07 DIAGNOSIS — I69322 Dysarthria following cerebral infarction: Secondary | ICD-10-CM | POA: Diagnosis not present

## 2016-03-07 DIAGNOSIS — E1140 Type 2 diabetes mellitus with diabetic neuropathy, unspecified: Secondary | ICD-10-CM | POA: Diagnosis not present

## 2016-03-07 DIAGNOSIS — I69352 Hemiplegia and hemiparesis following cerebral infarction affecting left dominant side: Secondary | ICD-10-CM | POA: Diagnosis not present

## 2016-03-07 DIAGNOSIS — I69393 Ataxia following cerebral infarction: Secondary | ICD-10-CM | POA: Diagnosis not present

## 2016-03-07 DIAGNOSIS — Z7984 Long term (current) use of oral hypoglycemic drugs: Secondary | ICD-10-CM | POA: Diagnosis not present

## 2016-03-07 DIAGNOSIS — I10 Essential (primary) hypertension: Secondary | ICD-10-CM | POA: Diagnosis not present

## 2016-03-08 DIAGNOSIS — Z7902 Long term (current) use of antithrombotics/antiplatelets: Secondary | ICD-10-CM | POA: Diagnosis not present

## 2016-03-08 DIAGNOSIS — I69352 Hemiplegia and hemiparesis following cerebral infarction affecting left dominant side: Secondary | ICD-10-CM | POA: Diagnosis not present

## 2016-03-08 DIAGNOSIS — Z7984 Long term (current) use of oral hypoglycemic drugs: Secondary | ICD-10-CM | POA: Diagnosis not present

## 2016-03-08 DIAGNOSIS — I10 Essential (primary) hypertension: Secondary | ICD-10-CM | POA: Diagnosis not present

## 2016-03-08 DIAGNOSIS — I69393 Ataxia following cerebral infarction: Secondary | ICD-10-CM | POA: Diagnosis not present

## 2016-03-08 DIAGNOSIS — I69322 Dysarthria following cerebral infarction: Secondary | ICD-10-CM | POA: Diagnosis not present

## 2016-03-08 DIAGNOSIS — E1140 Type 2 diabetes mellitus with diabetic neuropathy, unspecified: Secondary | ICD-10-CM | POA: Diagnosis not present

## 2016-03-09 ENCOUNTER — Ambulatory Visit: Payer: Commercial Managed Care - HMO | Admitting: Physical Medicine & Rehabilitation

## 2016-03-09 ENCOUNTER — Other Ambulatory Visit: Payer: Self-pay | Admitting: *Deleted

## 2016-03-09 DIAGNOSIS — E1140 Type 2 diabetes mellitus with diabetic neuropathy, unspecified: Secondary | ICD-10-CM | POA: Diagnosis not present

## 2016-03-09 DIAGNOSIS — I69393 Ataxia following cerebral infarction: Secondary | ICD-10-CM | POA: Diagnosis not present

## 2016-03-09 DIAGNOSIS — I69352 Hemiplegia and hemiparesis following cerebral infarction affecting left dominant side: Secondary | ICD-10-CM | POA: Diagnosis not present

## 2016-03-09 DIAGNOSIS — Z7902 Long term (current) use of antithrombotics/antiplatelets: Secondary | ICD-10-CM | POA: Diagnosis not present

## 2016-03-09 DIAGNOSIS — I10 Essential (primary) hypertension: Secondary | ICD-10-CM | POA: Diagnosis not present

## 2016-03-09 DIAGNOSIS — Z7984 Long term (current) use of oral hypoglycemic drugs: Secondary | ICD-10-CM | POA: Diagnosis not present

## 2016-03-09 DIAGNOSIS — I69322 Dysarthria following cerebral infarction: Secondary | ICD-10-CM | POA: Diagnosis not present

## 2016-03-09 NOTE — Patient Outreach (Signed)
[facility] Care Management  03/09/2016  Marolyn HammockSheila L Meiner May 06, 1950 [id number]   CSW was able to meet with patient today to perform a routine home visit.  CSW called patient prior to the visit to confirm that patient would be available, as well as to inquire as to whether or not it would be okay for CSW to meet with patient a little later than originally scheduled.  Patient confirmed, indicating that she would be home for the remainder of the evening.  However, when CSW arrived at patient's home, patient was not available.  CSW then contacted patient on her cell phone, only to learn that patient was out running errands with a family member.  CSW agreed to await patient's arrival, needing patient's signature to submit patient's completed Section 8 application to the [employer] [place] [employer]. Upon arrival, patient reported that she is no longer interested in obtaining a ground floor apartment [facility], where patient currently resides, wanting to relocate altogether.  CSW was able to provide patient with a list of Low Income Housing Apartments, a Brewing technologistenior Resource Guide and a assist with completion of the Section 8 application.  Patient was concerned about being taken advantage of by [employer] so CSW was able to take a look at patient's bill, but not able to decipher fraudulent charges.  CSW was able to schedule another routine home visit with patient for February 14th.  Patient has CSW's contact information and has been encouraged to contact CSW directly if social work needs are identified in the meantime. Danford BadJoanna Melodye Swor, BSW, MSW, LCSW  Licensed Restaurant manager, fast foodClinical Social Worker  [facility] [place] [facility]  Mailing [place] [address], [place], [place] [address] Physical Address-[address], [place], [place] [address] Toll Free Main # [phone] Fax # [phone] Cell # [phone]  Office #  [phone] [email or web address]

## 2016-03-10 DIAGNOSIS — Z7902 Long term (current) use of antithrombotics/antiplatelets: Secondary | ICD-10-CM | POA: Diagnosis not present

## 2016-03-10 DIAGNOSIS — I69352 Hemiplegia and hemiparesis following cerebral infarction affecting left dominant side: Secondary | ICD-10-CM | POA: Diagnosis not present

## 2016-03-10 DIAGNOSIS — I69322 Dysarthria following cerebral infarction: Secondary | ICD-10-CM | POA: Diagnosis not present

## 2016-03-10 DIAGNOSIS — Z7984 Long term (current) use of oral hypoglycemic drugs: Secondary | ICD-10-CM | POA: Diagnosis not present

## 2016-03-10 DIAGNOSIS — E1140 Type 2 diabetes mellitus with diabetic neuropathy, unspecified: Secondary | ICD-10-CM | POA: Diagnosis not present

## 2016-03-10 DIAGNOSIS — I69393 Ataxia following cerebral infarction: Secondary | ICD-10-CM | POA: Diagnosis not present

## 2016-03-10 DIAGNOSIS — I10 Essential (primary) hypertension: Secondary | ICD-10-CM | POA: Diagnosis not present

## 2016-03-12 ENCOUNTER — Telehealth: Payer: Self-pay | Admitting: Emergency Medicine

## 2016-03-12 ENCOUNTER — Telehealth: Payer: Self-pay | Admitting: Nurse Practitioner

## 2016-03-12 DIAGNOSIS — Z7902 Long term (current) use of antithrombotics/antiplatelets: Secondary | ICD-10-CM | POA: Diagnosis not present

## 2016-03-12 DIAGNOSIS — E1140 Type 2 diabetes mellitus with diabetic neuropathy, unspecified: Secondary | ICD-10-CM | POA: Diagnosis not present

## 2016-03-12 DIAGNOSIS — I10 Essential (primary) hypertension: Secondary | ICD-10-CM | POA: Diagnosis not present

## 2016-03-12 DIAGNOSIS — I69393 Ataxia following cerebral infarction: Secondary | ICD-10-CM | POA: Diagnosis not present

## 2016-03-12 DIAGNOSIS — I69322 Dysarthria following cerebral infarction: Secondary | ICD-10-CM | POA: Diagnosis not present

## 2016-03-12 DIAGNOSIS — Z7984 Long term (current) use of oral hypoglycemic drugs: Secondary | ICD-10-CM | POA: Diagnosis not present

## 2016-03-12 DIAGNOSIS — I69352 Hemiplegia and hemiparesis following cerebral infarction affecting left dominant side: Secondary | ICD-10-CM | POA: Diagnosis not present

## 2016-03-12 NOTE — Telephone Encounter (Signed)
[facility] called and wants to know if they can get verbal order to continue Home Health and OT 2 times a week for 4 weeks. Please advise thanks.

## 2016-03-12 NOTE — Telephone Encounter (Signed)
Kara KocherRegina, Child psychotherapistsocial worker [facility], request verbal order to go see Ms. Kara EndoKelly 2-3 more time. Kara KocherRegina stated pt need more help about her living conditions that's why she need couple more home visit.

## 2016-03-12 NOTE — Telephone Encounter (Signed)
Please advise, this is for Home health aid and OT.

## 2016-03-13 DIAGNOSIS — I10 Essential (primary) hypertension: Secondary | ICD-10-CM | POA: Diagnosis not present

## 2016-03-13 DIAGNOSIS — I69393 Ataxia following cerebral infarction: Secondary | ICD-10-CM | POA: Diagnosis not present

## 2016-03-13 DIAGNOSIS — I69322 Dysarthria following cerebral infarction: Secondary | ICD-10-CM | POA: Diagnosis not present

## 2016-03-13 DIAGNOSIS — Z7902 Long term (current) use of antithrombotics/antiplatelets: Secondary | ICD-10-CM | POA: Diagnosis not present

## 2016-03-13 DIAGNOSIS — Z7984 Long term (current) use of oral hypoglycemic drugs: Secondary | ICD-10-CM | POA: Diagnosis not present

## 2016-03-13 DIAGNOSIS — E1140 Type 2 diabetes mellitus with diabetic neuropathy, unspecified: Secondary | ICD-10-CM | POA: Diagnosis not present

## 2016-03-13 DIAGNOSIS — I69352 Hemiplegia and hemiparesis following cerebral infarction affecting left dominant side: Secondary | ICD-10-CM | POA: Diagnosis not present

## 2016-03-13 NOTE — Telephone Encounter (Signed)
ok 

## 2016-03-13 NOTE — Telephone Encounter (Signed)
They are calling again about order. Please follow up,Thank you.

## 2016-03-13 NOTE — Telephone Encounter (Signed)
Spoke with Marcelino DusterMichelle, she is aware.

## 2016-03-13 NOTE — Telephone Encounter (Signed)
Rene KocherRegina is aware.

## 2016-03-13 NOTE — Telephone Encounter (Signed)
Inform Kara KocherRegina that we will call as soon as we Kara Mendoza response.

## 2016-03-15 ENCOUNTER — Encounter: Payer: Medicare HMO | Admitting: Physical Medicine & Rehabilitation

## 2016-03-16 ENCOUNTER — Other Ambulatory Visit: Payer: Self-pay | Admitting: *Deleted

## 2016-03-16 DIAGNOSIS — E1140 Type 2 diabetes mellitus with diabetic neuropathy, unspecified: Secondary | ICD-10-CM | POA: Diagnosis not present

## 2016-03-16 DIAGNOSIS — I10 Essential (primary) hypertension: Secondary | ICD-10-CM | POA: Diagnosis not present

## 2016-03-16 DIAGNOSIS — I69393 Ataxia following cerebral infarction: Secondary | ICD-10-CM | POA: Diagnosis not present

## 2016-03-16 DIAGNOSIS — I69322 Dysarthria following cerebral infarction: Secondary | ICD-10-CM | POA: Diagnosis not present

## 2016-03-16 DIAGNOSIS — Z7902 Long term (current) use of antithrombotics/antiplatelets: Secondary | ICD-10-CM | POA: Diagnosis not present

## 2016-03-16 DIAGNOSIS — Z7984 Long term (current) use of oral hypoglycemic drugs: Secondary | ICD-10-CM | POA: Diagnosis not present

## 2016-03-16 DIAGNOSIS — I69352 Hemiplegia and hemiparesis following cerebral infarction affecting left dominant side: Secondary | ICD-10-CM | POA: Diagnosis not present

## 2016-03-16 NOTE — Patient Outreach (Signed)
[facility]) Care Management   03/16/2016  Kara Mendoza [birth year] [id number]  Kara Mendoza is an 66 y.o. female  Subjective:  HTN: Pt states he is doing well with no reports of elevated BP readings as the [facility] continues to take her BP prior to the start of PT services. States she does have a history of headaches and reports having one today but has not taken anything to relief the symptoms. Denies any other symptoms and continues to take her BP medications with no delays. Pt has a BP device but states it is difficult to place the device on to obtain a readings due to her limited ROM (history of stroke).  MEDICATION: Pt states she ran out of her current prescription of (Clopidogrel) and started taking an older dose for the last few days. Pt indicated she would contact her provider or the pharmacy and request a refill.  APPOINTMENTS: Pt reports she uses [facility] and continues to drive herself to her doctors appointments when needed.  HHeath/COMMUNITY RESOURCES: Pt reports [facility] continues to work with her with PT/RN and aide services but her case worker via [facility] states they will be providing 2 hours a day with adie services to accommodate pt's ongoing care in the residences.   Objective:   Review of Systems  All other systems reviewed and are negative.   Physical Exam  Constitutional: She is oriented to person, place, and time. She appears well-developed and well-nourished.  HENT:  Right Ear: External ear normal.  Left Ear: External ear normal.  Eyes: EOM are normal.  Neck: Normal range of motion.  Cardiovascular: Normal heart sounds.   Respiratory: Effort normal and breath sounds normal.  GI: Soft. Bowel sounds are normal.  Musculoskeletal: Normal range of motion.  Note some limitation to the left side hx stroke  Neurological: She is alert and oriented to person, place, and time.  Skin: Skin is warm and dry.  Psychiatric: She has a normal mood  and affect. Her behavior is normal. Judgment and thought content normal.    Encounter Medications:   Outpatient Encounter Prescriptions as of 03/16/2016  Medication Sig Note  . acetaminophen (TYLENOL) 500 MG tablet Take 2 tablets (1,000 mg total) by mouth every 6 (six) hours as needed. 12/07/2015: Only used occasionally  . albuterol (PROVENTIL HFA;VENTOLIN HFA) 108 (90 Base) MCG/ACT inhaler Inhale 2 puffs into the lungs every 6 (six) hours as needed for wheezing or shortness of breath.   Marland Kitchen. atorvastatin (LIPITOR) 40 MG tablet TAKE 1 TABLET(40 MG) BY MOUTH DAILY AT 6 PM   . clopidogrel (PLAVIX) 75 MG tablet Take 1 tablet (75 mg total) by mouth daily. (Patient taking differently: Take 75 mg by mouth daily. Pt reports taking a old prescription of this medications as she ran out two days ago of her current prescription. Note the prescription was out of date as 01/2015.) 12/07/2015: Per the patient's explanation, she last took her Plavix 2-3 weeks ago. She was under the assumption her doctor was going to switch her from Plavix to Xarelto. That never occurred. Mrs. Tresa EndoKelly stated that her Plavix was re-filled (just) today and is awaiting pick up at her pharmacy. I called [facility] on [address] to verify this.  Marland Kitchen. diclofenac sodium (VOLTAREN) 1 % GEL Apply topically 4 (four) times daily.   . DULoxetine (CYMBALTA) 20 MG capsule TAKE 1 CAPSULE(20 MG) BY MOUTH DAILY   . gabapentin (NEURONTIN) 300 MG capsule Take 1 capsule (300 mg total)  by mouth 3 (three) times daily.   . Glucose Blood (BLOOD GLUCOSE TEST STRIPS) STRP Inject 1 Units into the skin 4 (four) times daily -  before meals and at bedtime. OneTouch Ultra mini strips (Patient taking differently: Inject 1 Units into the skin 4 (four) times daily -  before meals and at bedtime. OneTouch Ultra mini strips)   . losartan-hydrochlorothiazide (HYZAAR) 100-25 MG tablet TAKE 1 TABLET BY MOUTH DAILY   . metFORMIN (GLUCOPHAGE) 500 MG tablet TAKE 1 TABLET(500 MG) BY MOUTH  DAILY WITH BREAKFAST   . Multiple Vitamins-Calcium (ONE-A-DAY WOMENS FORMULA PO) Take 1 tablet by mouth daily.   Marland Kitchen omeprazole (PRILOSEC) 20 MG capsule TAKE 1 CAPSULE(20 MG) BY MOUTH TWICE DAILY   . ONETOUCH DELICA LANCETS FINE MISC 1 Units by Does not apply route 4 (four) times daily -  before meals and at bedtime. (Patient taking differently: 1 Units by Does not apply route 4 (four) times daily -  before meals and at bedtime. )   . zolpidem (AMBIEN) 5 MG tablet Take 1 tablet (5 mg total) by mouth at bedtime as needed for sleep.    No facility-administered encounter medications on file as of 03/16/2016.     Functional Status:   In your present state of health, do you have any difficulty performing the following activities: 02/21/2016 02/07/2016  Hearing? [name]  Vision? N N  Difficulty concentrating or making decisions? [name]  Walking or climbing stairs? Y Y  Dressing or bathing? Y Y  Doing errands, shopping? Y N  Preparing Food and eating ? Y Y  Using the Toilet? Y N  In the past six months, have you accidently leaked urine? Y Y  Do you have problems with loss of bowel control? N N  Managing your Medications? N N  Managing your Finances? N N  Housekeeping or managing your Housekeeping? [name]  Some recent data might be hidden    Fall/Depression Screening:    PHQ 2/9 Scores 02/21/2016 02/07/2016 02/07/2016 01/23/2016 12/10/2014 05/01/2012 04/30/2012  PHQ - 2 Score 2 1 4 5 5 6  0  PHQ- 9 Score 8 - 13 17 19 14  -  BP 112/60 (BP Location: Right Arm, Patient Position: Sitting, Cuff Size: Normal)   Pulse 78   Resp 20   SpO2 97%   Assessment:   Ongoing case management related HTN Following on medication adherence Following on medical appointments Follow up HHealth/Community Resources involvement  Plan:  Will completed a physical assessment and report any abnormal findings. Will review the HTN information provided on the last home visit and verify pt remains asymptomatic however pt compliants of a  headache but has not taken any medication to relief her headache. RN made aware that RN has educational information via [facility] videos and print-outs for ongoing information related to her medical issues. Will strongly encourage pt to take pain relief to assist with her ongoing headache however if she felt the symptoms are getting worse to seek medical attention due to her history of stroke (pt with understanding). Will verified pt's medications and review any discrepancies. Will discussed the importance of not take old medications due to the potency and date of the expired medications. Will offer to call in pt's refill on her medication Clopidogrel and strongly encouraged pt to pick this medication up today and start tomorrow due to pt has already taken an old version of the same medication for today. Discussed possible signs and symptoms and what to do if  acute symptoms should occur (pt with understanding). Pt has sufficient transportation for pick up today. Will verify pt's attendance to all her medical appointments and continue to encouraged adherence as a prevention measure. Will verified ongoing involvement with the [facility] agency and pending services with the [facility] aide services for 8 hours daily.  Plan of care reviewed along with the goals that were updated. Will continue to encouraged adherence and review this plan of care and goals on the next home visit in March. Will scheduled follow up visit for this time and review pt's adherence to the plan.  Elliot Cousin, RN Care Management Coordinator [facility] Main Office [phone]

## 2016-03-19 DIAGNOSIS — I69322 Dysarthria following cerebral infarction: Secondary | ICD-10-CM | POA: Diagnosis not present

## 2016-03-19 DIAGNOSIS — I69393 Ataxia following cerebral infarction: Secondary | ICD-10-CM | POA: Diagnosis not present

## 2016-03-19 DIAGNOSIS — Z7902 Long term (current) use of antithrombotics/antiplatelets: Secondary | ICD-10-CM | POA: Diagnosis not present

## 2016-03-19 DIAGNOSIS — E1140 Type 2 diabetes mellitus with diabetic neuropathy, unspecified: Secondary | ICD-10-CM | POA: Diagnosis not present

## 2016-03-19 DIAGNOSIS — I10 Essential (primary) hypertension: Secondary | ICD-10-CM | POA: Diagnosis not present

## 2016-03-19 DIAGNOSIS — Z7984 Long term (current) use of oral hypoglycemic drugs: Secondary | ICD-10-CM | POA: Diagnosis not present

## 2016-03-19 DIAGNOSIS — I69352 Hemiplegia and hemiparesis following cerebral infarction affecting left dominant side: Secondary | ICD-10-CM | POA: Diagnosis not present

## 2016-03-19 LAB — CUP PACEART REMOTE DEVICE CHECK
Date Time Interrogation Session: 20171225013901
Implantable Pulse Generator Implant Date: 20150708

## 2016-03-20 DIAGNOSIS — I10 Essential (primary) hypertension: Secondary | ICD-10-CM | POA: Diagnosis not present

## 2016-03-20 DIAGNOSIS — Z7902 Long term (current) use of antithrombotics/antiplatelets: Secondary | ICD-10-CM | POA: Diagnosis not present

## 2016-03-20 DIAGNOSIS — I69393 Ataxia following cerebral infarction: Secondary | ICD-10-CM | POA: Diagnosis not present

## 2016-03-20 DIAGNOSIS — E1140 Type 2 diabetes mellitus with diabetic neuropathy, unspecified: Secondary | ICD-10-CM | POA: Diagnosis not present

## 2016-03-20 DIAGNOSIS — I69352 Hemiplegia and hemiparesis following cerebral infarction affecting left dominant side: Secondary | ICD-10-CM | POA: Diagnosis not present

## 2016-03-20 DIAGNOSIS — I69322 Dysarthria following cerebral infarction: Secondary | ICD-10-CM | POA: Diagnosis not present

## 2016-03-20 DIAGNOSIS — Z7984 Long term (current) use of oral hypoglycemic drugs: Secondary | ICD-10-CM | POA: Diagnosis not present

## 2016-03-21 ENCOUNTER — Other Ambulatory Visit: Payer: Self-pay | Admitting: *Deleted

## 2016-03-21 DIAGNOSIS — I69322 Dysarthria following cerebral infarction: Secondary | ICD-10-CM | POA: Diagnosis not present

## 2016-03-21 DIAGNOSIS — I69352 Hemiplegia and hemiparesis following cerebral infarction affecting left dominant side: Secondary | ICD-10-CM | POA: Diagnosis not present

## 2016-03-21 DIAGNOSIS — Z7902 Long term (current) use of antithrombotics/antiplatelets: Secondary | ICD-10-CM | POA: Diagnosis not present

## 2016-03-21 DIAGNOSIS — E1140 Type 2 diabetes mellitus with diabetic neuropathy, unspecified: Secondary | ICD-10-CM | POA: Diagnosis not present

## 2016-03-21 DIAGNOSIS — I10 Essential (primary) hypertension: Secondary | ICD-10-CM | POA: Diagnosis not present

## 2016-03-21 DIAGNOSIS — I69393 Ataxia following cerebral infarction: Secondary | ICD-10-CM | POA: Diagnosis not present

## 2016-03-21 DIAGNOSIS — Z7984 Long term (current) use of oral hypoglycemic drugs: Secondary | ICD-10-CM | POA: Diagnosis not present

## 2016-03-22 DIAGNOSIS — I69352 Hemiplegia and hemiparesis following cerebral infarction affecting left dominant side: Secondary | ICD-10-CM | POA: Diagnosis not present

## 2016-03-22 DIAGNOSIS — I69393 Ataxia following cerebral infarction: Secondary | ICD-10-CM | POA: Diagnosis not present

## 2016-03-22 DIAGNOSIS — I10 Essential (primary) hypertension: Secondary | ICD-10-CM | POA: Diagnosis not present

## 2016-03-22 DIAGNOSIS — E1140 Type 2 diabetes mellitus with diabetic neuropathy, unspecified: Secondary | ICD-10-CM | POA: Diagnosis not present

## 2016-03-22 DIAGNOSIS — I69322 Dysarthria following cerebral infarction: Secondary | ICD-10-CM | POA: Diagnosis not present

## 2016-03-22 DIAGNOSIS — Z7902 Long term (current) use of antithrombotics/antiplatelets: Secondary | ICD-10-CM | POA: Diagnosis not present

## 2016-03-22 DIAGNOSIS — Z7984 Long term (current) use of oral hypoglycemic drugs: Secondary | ICD-10-CM | POA: Diagnosis not present

## 2016-03-22 NOTE — Patient Outreach (Signed)
[facility]) Care Management  03/22/2016  Kara Mendoza [birth year] [id number]  CSW was able to meet with patient today to perform a routine home visit.  Patient denies having received a confirmation letter from the [employer] regarding the recent submission of her Section 8 Housing application.  CSW reiterated to patient that CSW received a case number upon submission of patient's application, which was provided to patient, along with a copy of the completed application.  CSW encouraged patient to look back through the packet of information provided to her by CSW so that she can continue to check the status of her pending application.  CSW also reminded patient that she is currently on the waiting list, which exceeds 24 months, at present. Patient continues to receive CAPS Forensic scientist) through the [place], and was recently approved for 2 additional hours of aide services per day.  In addition, patient is receiving home health physical therapy, as well as home health nursing services, both through [facility].  Due to patient's decision to relocate altogether, having declined two other apartments recently offered to her by management at [facility] complex, CSW has decided to refrain from trying to assist patient with housing, based on patient's decision to await approval through Section 8. Patient was encouraged to request an itemized statement of all monthly deposits, payments, interest earned, valued amount, etc. from her [place], as patient believes she is being "taken advantage of" and that they are billing her for fraudulent charges.  CSW made several attempts to try and contact [employer] while present with patient, to provide verbal consent to speak with a representative of patient's behalf, but was unsuccessful with all attempts.  Patient agreed to have her  son, Meeah Totino assist with follow-up. Patient denies having symptoms of Depression and/or Anxiety at present.  Patient also denies wanting counseling and supportive services, nor is patient interested in a referral for services.  CSW and patient were unable to identify any additional social work needs at present.  CSW was able to ensure that patient has the correct contact information for CSW, encouraging patient to contact CSW directly if social work needs arise in the near future.  Patient voiced understanding and was agreeable to this plan. CSW will perform a case closure on patient, as all goals of treatment have been met from social work standpoint and no additional social work needs have been identified at this time.  CSW will notify patient's RN CM with [place] [facility], Raina Mina of CSW's plans to close patient's case.  CSW will fax an update to patient's Primary Care Physician, Dr. Linda Hedges to ensure that they are aware of CSW's involvement with patient's plan of care.  CSW will submit a case closure request to Josepha Pigg, Care Management Assistant with [facility] [facility], in the form of an [employer].  CSW will ensure that Mrs. Quentin [place] is aware of Ms. Zigmund Daniel, RN CM with [facility], continued involvement with patient's care. Nat Christen, BSW, MSW, LCSW  Licensed Education officer, environmental [facility] System  Mailing Address-[address] [place],  [address] Physical Address-[address] [place], [place],  [address] Toll Free Main # [phone] Fax # [phone] Cell # [phone]  Office # [phone] [email or web address]

## 2016-03-26 DIAGNOSIS — I69352 Hemiplegia and hemiparesis following cerebral infarction affecting left dominant side: Secondary | ICD-10-CM | POA: Diagnosis not present

## 2016-03-26 DIAGNOSIS — E1140 Type 2 diabetes mellitus with diabetic neuropathy, unspecified: Secondary | ICD-10-CM | POA: Diagnosis not present

## 2016-03-26 DIAGNOSIS — I69322 Dysarthria following cerebral infarction: Secondary | ICD-10-CM | POA: Diagnosis not present

## 2016-03-26 DIAGNOSIS — I10 Essential (primary) hypertension: Secondary | ICD-10-CM | POA: Diagnosis not present

## 2016-03-26 DIAGNOSIS — Z7984 Long term (current) use of oral hypoglycemic drugs: Secondary | ICD-10-CM | POA: Diagnosis not present

## 2016-03-26 DIAGNOSIS — I69393 Ataxia following cerebral infarction: Secondary | ICD-10-CM | POA: Diagnosis not present

## 2016-03-26 DIAGNOSIS — Z7902 Long term (current) use of antithrombotics/antiplatelets: Secondary | ICD-10-CM | POA: Diagnosis not present

## 2016-03-27 DIAGNOSIS — I69393 Ataxia following cerebral infarction: Secondary | ICD-10-CM | POA: Diagnosis not present

## 2016-03-27 DIAGNOSIS — Z7984 Long term (current) use of oral hypoglycemic drugs: Secondary | ICD-10-CM | POA: Diagnosis not present

## 2016-03-27 DIAGNOSIS — I69322 Dysarthria following cerebral infarction: Secondary | ICD-10-CM | POA: Diagnosis not present

## 2016-03-27 DIAGNOSIS — I10 Essential (primary) hypertension: Secondary | ICD-10-CM | POA: Diagnosis not present

## 2016-03-27 DIAGNOSIS — E1140 Type 2 diabetes mellitus with diabetic neuropathy, unspecified: Secondary | ICD-10-CM | POA: Diagnosis not present

## 2016-03-27 DIAGNOSIS — Z7902 Long term (current) use of antithrombotics/antiplatelets: Secondary | ICD-10-CM | POA: Diagnosis not present

## 2016-03-27 DIAGNOSIS — I69352 Hemiplegia and hemiparesis following cerebral infarction affecting left dominant side: Secondary | ICD-10-CM | POA: Diagnosis not present

## 2016-03-28 ENCOUNTER — Ambulatory Visit: Payer: Medicare Other | Admitting: Podiatry

## 2016-03-28 DIAGNOSIS — H04123 Dry eye syndrome of bilateral lacrimal glands: Secondary | ICD-10-CM | POA: Diagnosis not present

## 2016-03-28 DIAGNOSIS — H35372 Puckering of macula, left eye: Secondary | ICD-10-CM | POA: Diagnosis not present

## 2016-03-28 DIAGNOSIS — E1140 Type 2 diabetes mellitus with diabetic neuropathy, unspecified: Secondary | ICD-10-CM | POA: Diagnosis not present

## 2016-03-28 DIAGNOSIS — Z7984 Long term (current) use of oral hypoglycemic drugs: Secondary | ICD-10-CM | POA: Diagnosis not present

## 2016-03-28 DIAGNOSIS — I69322 Dysarthria following cerebral infarction: Secondary | ICD-10-CM | POA: Diagnosis not present

## 2016-03-28 DIAGNOSIS — I69352 Hemiplegia and hemiparesis following cerebral infarction affecting left dominant side: Secondary | ICD-10-CM | POA: Diagnosis not present

## 2016-03-28 DIAGNOSIS — I10 Essential (primary) hypertension: Secondary | ICD-10-CM | POA: Diagnosis not present

## 2016-03-28 DIAGNOSIS — Z7902 Long term (current) use of antithrombotics/antiplatelets: Secondary | ICD-10-CM | POA: Diagnosis not present

## 2016-03-28 DIAGNOSIS — E119 Type 2 diabetes mellitus without complications: Secondary | ICD-10-CM | POA: Diagnosis not present

## 2016-03-28 DIAGNOSIS — I69393 Ataxia following cerebral infarction: Secondary | ICD-10-CM | POA: Diagnosis not present

## 2016-03-28 DIAGNOSIS — H2513 Age-related nuclear cataract, bilateral: Secondary | ICD-10-CM | POA: Diagnosis not present

## 2016-03-28 LAB — HM DIABETES EYE EXAM

## 2016-03-29 ENCOUNTER — Ambulatory Visit (INDEPENDENT_AMBULATORY_CARE_PROVIDER_SITE_OTHER): Payer: Medicare HMO | Admitting: *Deleted

## 2016-03-29 DIAGNOSIS — I639 Cerebral infarction, unspecified: Secondary | ICD-10-CM

## 2016-03-30 ENCOUNTER — Other Ambulatory Visit: Payer: Self-pay | Admitting: *Deleted

## 2016-03-30 ENCOUNTER — Encounter: Payer: Medicare HMO | Attending: Physical Medicine & Rehabilitation | Admitting: Physical Medicine & Rehabilitation

## 2016-03-30 DIAGNOSIS — I10 Essential (primary) hypertension: Secondary | ICD-10-CM | POA: Diagnosis not present

## 2016-03-30 DIAGNOSIS — Z7902 Long term (current) use of antithrombotics/antiplatelets: Secondary | ICD-10-CM | POA: Diagnosis not present

## 2016-03-30 DIAGNOSIS — Z7984 Long term (current) use of oral hypoglycemic drugs: Secondary | ICD-10-CM | POA: Diagnosis not present

## 2016-03-30 DIAGNOSIS — E1140 Type 2 diabetes mellitus with diabetic neuropathy, unspecified: Secondary | ICD-10-CM | POA: Diagnosis not present

## 2016-03-30 DIAGNOSIS — I69352 Hemiplegia and hemiparesis following cerebral infarction affecting left dominant side: Secondary | ICD-10-CM | POA: Diagnosis not present

## 2016-03-30 DIAGNOSIS — I69393 Ataxia following cerebral infarction: Secondary | ICD-10-CM | POA: Diagnosis not present

## 2016-03-30 DIAGNOSIS — I69322 Dysarthria following cerebral infarction: Secondary | ICD-10-CM | POA: Diagnosis not present

## 2016-03-30 NOTE — Patient Outreach (Signed)
[facility]) Care Management  03/30/2016  KAYANNA MCKILLOP 1950-09-11 [id number]   RN spoke with the social worker via [employer]) inquired on [facility] disposition from a social worker's prospective. Based upon the recent involved social worker Leisure centre manager provided a contact number for details on pt's current status and involvement with the community resources that have taken place. Note this social worker has discharged this pt based upon her goals being met with this pt. RN informed the agency that this RN will remain involved over the next few months assisting pt with managing her HTN on the current plan of care.   Raina Mina, RN Care Management Coordinator [place] Office [phone]

## 2016-03-30 NOTE — Progress Notes (Signed)
Carelink Summary Report / Loop Recorder 

## 2016-04-01 LAB — CUP PACEART REMOTE DEVICE CHECK
Date Time Interrogation Session: 20180124023722
Implantable Pulse Generator Implant Date: 20150708

## 2016-04-02 DIAGNOSIS — I69393 Ataxia following cerebral infarction: Secondary | ICD-10-CM | POA: Diagnosis not present

## 2016-04-02 DIAGNOSIS — Z7984 Long term (current) use of oral hypoglycemic drugs: Secondary | ICD-10-CM | POA: Diagnosis not present

## 2016-04-02 DIAGNOSIS — I69322 Dysarthria following cerebral infarction: Secondary | ICD-10-CM | POA: Diagnosis not present

## 2016-04-02 DIAGNOSIS — E1140 Type 2 diabetes mellitus with diabetic neuropathy, unspecified: Secondary | ICD-10-CM | POA: Diagnosis not present

## 2016-04-02 DIAGNOSIS — I69352 Hemiplegia and hemiparesis following cerebral infarction affecting left dominant side: Secondary | ICD-10-CM | POA: Diagnosis not present

## 2016-04-02 DIAGNOSIS — I10 Essential (primary) hypertension: Secondary | ICD-10-CM | POA: Diagnosis not present

## 2016-04-02 DIAGNOSIS — Z7902 Long term (current) use of antithrombotics/antiplatelets: Secondary | ICD-10-CM | POA: Diagnosis not present

## 2016-04-04 ENCOUNTER — Encounter: Payer: Self-pay | Admitting: Podiatry

## 2016-04-04 ENCOUNTER — Ambulatory Visit (INDEPENDENT_AMBULATORY_CARE_PROVIDER_SITE_OTHER): Payer: Medicare HMO | Admitting: Podiatry

## 2016-04-04 VITALS — BP 155/106 | HR 96 | Resp 18

## 2016-04-04 DIAGNOSIS — Z7902 Long term (current) use of antithrombotics/antiplatelets: Secondary | ICD-10-CM | POA: Diagnosis not present

## 2016-04-04 DIAGNOSIS — I10 Essential (primary) hypertension: Secondary | ICD-10-CM | POA: Diagnosis not present

## 2016-04-04 DIAGNOSIS — Z7984 Long term (current) use of oral hypoglycemic drugs: Secondary | ICD-10-CM | POA: Diagnosis not present

## 2016-04-04 DIAGNOSIS — L84 Corns and callosities: Secondary | ICD-10-CM

## 2016-04-04 DIAGNOSIS — I69393 Ataxia following cerebral infarction: Secondary | ICD-10-CM | POA: Diagnosis not present

## 2016-04-04 DIAGNOSIS — L608 Other nail disorders: Secondary | ICD-10-CM | POA: Diagnosis not present

## 2016-04-04 DIAGNOSIS — E1151 Type 2 diabetes mellitus with diabetic peripheral angiopathy without gangrene: Secondary | ICD-10-CM | POA: Diagnosis not present

## 2016-04-04 DIAGNOSIS — Q828 Other specified congenital malformations of skin: Secondary | ICD-10-CM

## 2016-04-04 DIAGNOSIS — I69322 Dysarthria following cerebral infarction: Secondary | ICD-10-CM | POA: Diagnosis not present

## 2016-04-04 DIAGNOSIS — E1140 Type 2 diabetes mellitus with diabetic neuropathy, unspecified: Secondary | ICD-10-CM | POA: Diagnosis not present

## 2016-04-04 DIAGNOSIS — I69352 Hemiplegia and hemiparesis following cerebral infarction affecting left dominant side: Secondary | ICD-10-CM | POA: Diagnosis not present

## 2016-04-04 NOTE — Patient Instructions (Signed)

## 2016-04-04 NOTE — Progress Notes (Signed)
   Subjective:    Patient ID: Kara HammockSheila L Brassell, female    DOB: 05/24/1950, 66 y.o.   MRN: [id number]  HPI     This patient presents today complaining that her toenails are becoming progressively more difficult to trim over the last 6-12 months and she is requesting trimming of the toenails. She denies any previous podiatric care. Patient is diabetic and denies history of foot ulceration, claudication or amputation Patient is current smoker and has reduced smoking from 2 packs to several cigarettes a day Patient is had a history of CVAs 2 affecting balance and speech   Review of Systems  Constitutional: Positive for activity change, appetite change and fatigue.  HENT: Positive for hearing loss.   Eyes: Positive for visual disturbance.  Cardiovascular: Positive for palpitations.  Gastrointestinal: Positive for constipation.  Genitourinary: Positive for frequency.  Musculoskeletal: Positive for gait problem.  Skin: Positive for color change.  Neurological: Positive for numbness and headaches.  Hematological: Bruises/bleeds easily.  All other systems reviewed and are negative.      Objective:   Physical Exam  Patient states she is approximately 5 foot 2 weighs approximately 170 pounds  Orientated 3  Vascular: DP pulses 2/4 bilaterally PT pulses 1/4 bilaterally Capillary reflex within normal limits bilaterally  Neurological: Sensation to 10 g monofilament wire intact 5/5 bilaterally Vibratory sensation reactive bilaterally Ankle reflex reactive bilaterally  Dermatological: No open skin lesions bilaterally Incurvated toenails with striping along the medial lateral borders of all the toenails Plantar callus fourth right MPJ  Musculoskeletal: There is no restriction ankle, subtalar, midtarsal joints bilaterally Manual motor testing dorsi flexion, plantar flexion, inversion, eversion 5/5 bilaterally      Assessment & Plan:   Assessment: Diabetic with decreased  posterior tibial pulses suggestive of peripheral arterial disease Incurvated toenails 6-10 Plantar callus right  Plan: Reviewed the results of exam with patient today Debridement toenails 6-10 mechanically athletic without any bleeding Debride plantar callus without any bleeding Occurs patient to quit smoking  Reappoint 3 months

## 2016-04-13 ENCOUNTER — Other Ambulatory Visit: Payer: Self-pay | Admitting: *Deleted

## 2016-04-13 NOTE — Patient Outreach (Signed)
[facility]) Care Management   04/13/2016  Kara Mendoza 10/21/1950 [id number]  Kara Mendoza is an 66 y.o. female  Subjective:  HTN: Pt reports she continues to do well and states if she experiences any symptoms she always reports to her provider. States her primary provider has made the appropriate  referrals based upon her symptoms in the past when needed. Denies any major issues or events related to her HTN indicated her readings have been good on her providers visits.  MEDICAL APPOINTMENTS: Pt states she continues to drive herself to and from all appointments. No missed appointments reported today.  MEDICATIONS: Pt states she has a sufficient supply and continues to take all her medications with no reported issues.  SOCIAL WORK [facility]): Pt confirms she received some assistance from the involved social worker however not satisfied with current aide involved with her care. Pt made aware that she can contact the agency Chip Boer) or her case worker via [facility] with any concerns or requested changes.  Objective:   Review of Systems  Constitutional: Negative.   HENT: Negative.   Eyes: Negative.   Respiratory: Negative.   Cardiovascular: Negative.   Gastrointestinal: Negative.   Genitourinary: Negative.   Musculoskeletal: Negative.   Skin: Negative.   Neurological: Negative.   Endo/Heme/Allergies: Negative.   Psychiatric/Behavioral: Negative.     Physical Exam  Constitutional: She is oriented to person, place, and time. She appears well-developed and well-nourished.  HENT:  Right Ear: External ear normal.  Left Ear: External ear normal.  Eyes: EOM are normal.  Neck: Normal range of motion.  Cardiovascular: Normal heart sounds.   Respiratory: Effort normal and breath sounds normal.  GI: Soft.  Musculoskeletal: Normal range of motion.  Limited due to history of residual from a stroke  Neurological: She is alert and oriented to person, place, and time.  Skin: Skin  is warm and dry.  Psychiatric: She has a normal mood and affect. Her behavior is normal. Judgment and thought content normal.    Encounter Medications:   Outpatient Encounter Prescriptions as of 04/13/2016  Medication Sig Note  . acetaminophen (TYLENOL) 500 MG tablet Take 2 tablets (1,000 mg total) by mouth every 6 (six) hours as needed. 12/07/2015: Only used occasionally  . albuterol (PROVENTIL HFA;VENTOLIN HFA) 108 (90 Base) MCG/ACT inhaler Inhale 2 puffs into the lungs every 6 (six) hours as needed for wheezing or shortness of breath.   Marland Kitchen atorvastatin (LIPITOR) 40 MG tablet TAKE 1 TABLET(40 MG) BY MOUTH DAILY AT 6 PM   . clopidogrel (PLAVIX) 75 MG tablet Take 1 tablet (75 mg total) by mouth daily. (Patient taking differently: Take 75 mg by mouth daily. Pt reports taking a old prescription of this medications as she ran out two days ago of her current prescription. Note the prescription was out of date as 01/2015.) 12/07/2015: Per the patient's explanation, she last took her Plavix 2-3 weeks ago. She was under the assumption her doctor was going to switch her from Plavix to Xarelto. That never occurred. [name] stated that her Plavix was re-filled (just) today and is awaiting pick up at her pharmacy. I called [facility] on [address] to verify this.  Marland Kitchen diclofenac sodium (VOLTAREN) 1 % GEL Apply topically 4 (four) times daily.   . DULoxetine (CYMBALTA) 20 MG capsule TAKE 1 CAPSULE(20 MG) BY MOUTH DAILY   . gabapentin (NEURONTIN) 300 MG capsule Take 1 capsule (300 mg total) by mouth 3 (three) times daily.   . Glucose  Blood (BLOOD GLUCOSE TEST STRIPS) STRP Inject 1 Units into the skin 4 (four) times daily -  before meals and at bedtime. OneTouch Ultra mini strips (Patient taking differently: Inject 1 Units into the skin 4 (four) times daily -  before meals and at bedtime. OneTouch Ultra mini strips)   . losartan-hydrochlorothiazide (HYZAAR) 100-25 MG tablet TAKE 1 TABLET BY MOUTH DAILY   . metFORMIN  (GLUCOPHAGE) 500 MG tablet TAKE 1 TABLET(500 MG) BY MOUTH DAILY WITH BREAKFAST   . Multiple Vitamins-Calcium (ONE-A-DAY WOMENS FORMULA PO) Take 1 tablet by mouth daily.   Marland Kitchen. omeprazole (PRILOSEC) 20 MG capsule TAKE 1 CAPSULE(20 MG) BY MOUTH TWICE DAILY   . ONETOUCH DELICA LANCETS FINE MISC 1 Units by Does not apply route 4 (four) times daily -  before meals and at bedtime. (Patient taking differently: 1 Units by Does not apply route 4 (four) times daily -  before meals and at bedtime. )   . zolpidem (AMBIEN) 5 MG tablet Take 1 tablet (5 mg total) by mouth at bedtime as needed for sleep.    No facility-administered encounter medications on file as of 04/13/2016.     Functional Status:   In your present state of health, do you have any difficulty performing the following activities: 02/21/2016 02/07/2016  Hearing? Malvin JohnsY Y  Vision? N N  Difficulty concentrating or making decisions? Malvin JohnsY Y  Walking or climbing stairs? Y Y  Dressing or bathing? Y Y  Doing errands, shopping? Y N  Preparing Food and eating ? Y Y  Using the Toilet? Y N  In the past six months, have you accidently leaked urine? Y Y  Do you have problems with loss of bowel control? N N  Managing your Medications? N N  Managing your Finances? N N  Housekeeping or managing your Housekeeping? Y Y  Some recent data might be hidden  BP 138/78 (BP Location: Right Arm, Patient Position: Sitting, Cuff Size: Normal)   Pulse 88   Resp 20   Ht 1.575 m (5\' 2" )   Wt 173 lb (78.5 kg)   SpO2 98%   BMI 31.64 kg/m    Fall/Depression Screening:    PHQ 2/9 Scores 02/21/2016 02/07/2016 02/07/2016 01/23/2016 12/10/2014 05/01/2012 04/30/2012  PHQ - 2 Score 2 1 4 5 5 6  0  PHQ- 9 Score 8 - 13 17 19 14  -    Assessment:   Ongoing case management related to HTN Following up on appointments adherence Follow up on medication adherence Follow up on any needs related to community resources   Plan:  Will completed a physical assessment and report any abnormal  findings. Will verify pt continues to check her BP when needed especially with any precipitating symptoms. Will verify pt's understanding of what to do if acute symptoms should occur and continue to review educational information related to HTN.  Will verified pt's adherence to her ongoing medical appointments with no reported delays and continue to encouraged adherence. Will verified pt's adherence to her ongoing medications and that pt continues to have a sufficient supply for daily dosing. Will continue to encourage adherence. Will verfy [facility] social worker her spoken with pt and assisted as where needed.  No other request or issues needed at this time. Plan of care reviewed and goals alone with the expectations to [facility] based upon the progress as pt continues to self manage his care with no reported problems or issues. Will follow up next month with an  additional home visit follow by a telephonic follow up call prior to discharigng from the [facility] program.   Elliot Cousin, RN Care Management Coordinator [facility] Main Office [phone]

## 2016-04-19 ENCOUNTER — Encounter: Payer: Self-pay | Admitting: Nurse Practitioner

## 2016-04-19 NOTE — Progress Notes (Signed)
Abstracted result and sent to scan  

## 2016-04-20 LAB — CUP PACEART REMOTE DEVICE CHECK
Date Time Interrogation Session: 20180223023812
Implantable Pulse Generator Implant Date: 20150708

## 2016-04-26 ENCOUNTER — Encounter: Payer: Medicaid - Out of State | Admitting: Physical Medicine & Rehabilitation

## 2016-04-26 ENCOUNTER — Encounter: Payer: Self-pay | Admitting: Physical Medicine & Rehabilitation

## 2016-04-26 ENCOUNTER — Encounter: Payer: Medicaid - Out of State | Attending: Physical Medicine & Rehabilitation

## 2016-04-26 ENCOUNTER — Ambulatory Visit (HOSPITAL_BASED_OUTPATIENT_CLINIC_OR_DEPARTMENT_OTHER): Payer: 59 | Admitting: Physical Medicine & Rehabilitation

## 2016-04-26 VITALS — BP 148/85 | HR 79

## 2016-04-26 DIAGNOSIS — G8929 Other chronic pain: Secondary | ICD-10-CM | POA: Insufficient documentation

## 2016-04-26 DIAGNOSIS — I69354 Hemiplegia and hemiparesis following cerebral infarction affecting left non-dominant side: Secondary | ICD-10-CM | POA: Diagnosis not present

## 2016-04-26 DIAGNOSIS — Z7984 Long term (current) use of oral hypoglycemic drugs: Secondary | ICD-10-CM | POA: Diagnosis not present

## 2016-04-26 DIAGNOSIS — E0590 Thyrotoxicosis, unspecified without thyrotoxic crisis or storm: Secondary | ICD-10-CM | POA: Diagnosis not present

## 2016-04-26 DIAGNOSIS — E785 Hyperlipidemia, unspecified: Secondary | ICD-10-CM | POA: Insufficient documentation

## 2016-04-26 DIAGNOSIS — E119 Type 2 diabetes mellitus without complications: Secondary | ICD-10-CM | POA: Insufficient documentation

## 2016-04-26 DIAGNOSIS — F17210 Nicotine dependence, cigarettes, uncomplicated: Secondary | ICD-10-CM | POA: Diagnosis not present

## 2016-04-26 DIAGNOSIS — I69352 Hemiplegia and hemiparesis following cerebral infarction affecting left dominant side: Secondary | ICD-10-CM | POA: Diagnosis not present

## 2016-04-26 DIAGNOSIS — M1990 Unspecified osteoarthritis, unspecified site: Secondary | ICD-10-CM | POA: Insufficient documentation

## 2016-04-26 DIAGNOSIS — Z7902 Long term (current) use of antithrombotics/antiplatelets: Secondary | ICD-10-CM | POA: Diagnosis not present

## 2016-04-26 DIAGNOSIS — I10 Essential (primary) hypertension: Secondary | ICD-10-CM

## 2016-04-26 DIAGNOSIS — I69322 Dysarthria following cerebral infarction: Secondary | ICD-10-CM | POA: Diagnosis not present

## 2016-04-26 DIAGNOSIS — F339 Major depressive disorder, recurrent, unspecified: Secondary | ICD-10-CM | POA: Diagnosis not present

## 2016-04-26 DIAGNOSIS — E1140 Type 2 diabetes mellitus with diabetic neuropathy, unspecified: Secondary | ICD-10-CM

## 2016-04-26 DIAGNOSIS — G90513 Complex regional pain syndrome I of upper limb, bilateral: Secondary | ICD-10-CM

## 2016-04-26 DIAGNOSIS — Z8673 Personal history of transient ischemic attack (TIA), and cerebral infarction without residual deficits: Secondary | ICD-10-CM | POA: Diagnosis not present

## 2016-04-26 DIAGNOSIS — F419 Anxiety disorder, unspecified: Secondary | ICD-10-CM | POA: Diagnosis not present

## 2016-04-26 DIAGNOSIS — J45909 Unspecified asthma, uncomplicated: Secondary | ICD-10-CM | POA: Diagnosis not present

## 2016-04-26 DIAGNOSIS — I69393 Ataxia following cerebral infarction: Secondary | ICD-10-CM

## 2016-04-26 DIAGNOSIS — R51 Headache: Secondary | ICD-10-CM | POA: Diagnosis not present

## 2016-04-26 DIAGNOSIS — I69919 Unspecified symptoms and signs involving cognitive functions following unspecified cerebrovascular disease: Secondary | ICD-10-CM | POA: Diagnosis not present

## 2016-04-26 DIAGNOSIS — I69319 Unspecified symptoms and signs involving cognitive functions following cerebral infarction: Secondary | ICD-10-CM | POA: Insufficient documentation

## 2016-04-26 DIAGNOSIS — K219 Gastro-esophageal reflux disease without esophagitis: Secondary | ICD-10-CM | POA: Diagnosis not present

## 2016-04-26 MED ORDER — DICLOFENAC SODIUM 1 % TD GEL: 2.0000 g | Freq: Four times a day (QID) | TRANSDERMAL | 5 refills | Status: DC

## 2016-04-26 NOTE — Progress Notes (Signed)
Subjective:    Patient ID: Kara Mendoza, female    DOB: 24-Oct-1950, 66 y.o.   MRN: [id number] Reviewed notes from Epic, discussed with patient, Patient poor historian, does not recall almost any of her history when reviewed, did not remember me from her hospitalization or outpatient clinic visit 3127 HPI 66 year old female with history of chronic left upper extremity pain. She states that she injured her hand at least 3 years ago. There was a neurology note from 2014, this was for the evaluation of chronic left upper extremity pain. She is known to me from a CVA. Admission to the rehabilitation unit from 08/12/2013 to 08/22/2013. Patient had acute infarcts in the left parietal operculum, left insula and inferior parietal lobe on 08/09/2013 Last note was an office visit 09/28/2013 At that time, her strength is 4/5 in the right upper extremity, 3/5 in the left upper extremity limited by pain. Lower extremity strength is 5/5 in the left lower limb, 4/5 in the right lower limb Patient was wanting a surgical fix of her left upper extremity pain. She had previous normal vascular studies, EMG, NCV, demonstrating  At the time of my last visit, she was not interested in referral for stellate ganglion blocks, subsequent to that time. Patient indicates that she had some type of injections, although this may in fact have been EMG/NCV previously described     Reviewed note from 11/05/2013 wake [facility] orthopedic department evaluation of complex regional pain syndrome, bone scan showed increased uptake, left metacarpophalangeal joint, left distal radius. On delayed phase  Was referred to Dr. Roderic Ovens, for pain management [place] for regional blocks and dorsal column stimulator, once again, patient was not interested in either stellate ganglion blocks or spinal cord stimulation. Did not return to [place] pain in [place] and instead transferred care to [facility] Established with PCP in [place] and then  at pain clinic, trialed Cymbalta and gabapentin as well as ketamine cream  Was treated at a pain clinic in [place] last visit was in July 2017. Patient states she no longer had transportation. Her regimen was gabapentin 603 times a day, Voltaren gel 4 times per day, Cymbalta 60 mg per day  Past surgical history loop recorder place.  Do not see history of defibrillator, although this is referred to in one note from [facility] pain clinic  CNA helps with dressing and bathing M-F, gets some assist on weekend from next door neighbor Had HHPT ~73mo ago Patient lives in an apartment with 17 steps, she states she has fallen on the steps. She requests a note for her Mudlogger Pain Inventory Average Pain 10 Pain Right Now 10 My pain is sharp, burning and tingling  In the last 24 hours, has pain interfered with the following? General activity 10 Relation with others 8 Enjoyment of life 10 What TIME of day is your pain at its worst? all Sleep (in general) Fair  Pain is worse with: bending, standing, unsure and some activites Pain improves with: heat/ice and medication Relief from Meds: 8  Mobility use a cane use a walker ability to climb steps?  no do you drive?  yes transfers alone  Function not employed: date last employed 2016 disabled: date disabled 2016 I need assistance with the following:  dressing, bathing, meal prep, household duties and shopping  Neuro/Psych bladder control problems weakness numbness tingling trouble walking depression  Prior Studies Any changes since last visit?  no  Physicians involved in your  care Any changes since last visit?  no   Family History  Problem Relation Age of Onset  . Cancer Father   . Diabetes Sister   . Diabetes Brother    Social History   Social History  . Marital status: Single    Spouse name: N/A  . Number of children: 3  . Years of education: College   Occupational History  .  Other    Disabled    Social History Main Topics  . Smoking status: Light Tobacco Smoker    Packs/day: 0.20    Years: 15.00    Types: Cigarettes  . Smokeless tobacco: Never Used     Comment: Tryin to quit, 07/08/14 smoking 3-4 a day  . Alcohol use No  . Drug use: No  . Sexual activity: Not Asked   Other Topics Concern  . None   Social History Narrative   Patient is single with 3 children.   Patient is right handed.   Patient has college education.   Patient drinks 2 cups daily.   Past Surgical History:  Procedure Laterality Date  . CESAREAN SECTION     x3  . FINGER SURGERY    . LOOP RECORDER IMPLANT  08-12-2013   MDT LINQ implanted by Dr Graciela Husbands for cryptogenic stroke  . LOOP RECORDER IMPLANT N/A 08/12/2013   Procedure: LOOP RECORDER IMPLANT;  Surgeon: Duke Salvia, MD;  Location: [facility] CATH LAB;  Service: Cardiovascular;  Laterality: N/A;  . TEE WITHOUT CARDIOVERSION N/A 08/12/2013   Procedure: TRANSESOPHAGEAL ECHOCARDIOGRAM (TEE);  Surgeon: Wendall Stade, MD;  Location: [facility] ENDOSCOPY;  Service: Cardiovascular;  Laterality: N/A;   Past Medical History:  Diagnosis Date  . Abnormality of gait   . Acute bronchitis   . Allergy   . Anxiety   . Anxiety state, unspecified   . Asthma   . Chronic arm pain 2008   left  . Chronic headache   . Chronic leg pain 2008   left  . Chronic pain syndrome   . Constipation   . Diabetes mellitus (HCC)   . Dysarthria   . Esophageal reflux   . Fatigue   . History of fall   . Hyperlipidemia   . Hypertension   . Hyperthyroidism   . LOOP Recorder 10/14/2013  . Major depressive disorder, recurrent episode, moderate (HCC)   . Memory loss   . Neuromuscular disorder (HCC)   . Osteoarthritis   . Stroke (HCC)   . Tobacco use disorder    BP (!) 148/85   Pulse 79   SpO2 98%   Opioid Risk Score:   Fall Risk Score:  `1  Depression screen PHQ 2/9  Depression screen [facility] 2/9 02/21/2016 02/07/2016 02/07/2016 01/23/2016 12/10/2014 05/01/2012 04/30/2012  Decreased Interest 1  1 2 3 3 3  0  Down, Depressed, Hopeless 1 0 2 2 2 3  0  PHQ - 2 Score 2 1 4 5 5 6  0  Altered sleeping 1 - 0 0 2 0 -  Tired, decreased energy 1 - 3 3 3 3  -  Change in appetite 1 - 2 3 2 2  -  Feeling bad or failure about yourself  1 - 2 3 2  0 -  Trouble concentrating 1 - 1 3 2 3  -  Moving slowly or fidgety/restless 1 - 1 0 3 0 -  Suicidal thoughts 0 - 0 0 0 0 -  PHQ-9 Score 8 - 13 17 19 14  -  Difficult doing work/chores Extremely dIfficult -  Extremely dIfficult Extremely dIfficult - - -  Some recent data might be hidden    Review of Systems  Constitutional: Positive for diaphoresis and unexpected weight change.  HENT: Negative.   Eyes: Negative.   Respiratory: Negative.   Cardiovascular: Negative.   Gastrointestinal: Negative.   Endocrine: Negative.   Genitourinary: Negative.   Musculoskeletal: Positive for joint swelling.  Skin: Positive for rash.  Allergic/Immunologic: Negative.   Neurological: Negative.   Hematological: Negative.   Psychiatric/Behavioral: Negative.   All other systems reviewed and are negative.      Objective:   Physical Exam  Constitutional: She is oriented to person, place, and time. She appears well-developed and well-nourished.  HENT:  Head: Normocephalic and atraumatic.  Eyes: Conjunctivae and EOM are normal. Pupils are equal, round, and reactive to light.  Neck: Normal range of motion.  Musculoskeletal:  1 plus edema in the left hand, forearm area. No erythema. No nail changes. No hypo-or hyperhidrosis. Right hand without edema, skin color changes, hyperhidrosis or hyperhidrosis  Neurological: She is alert and oriented to person, place, and time. She displays no atrophy and no tremor. No cranial nerve deficit. Gait abnormal.  Hypersensitivity to touch from the lower deltoid region to the hand on the left side, this is marked. Mild hypersensitivity to touch right hand  Ambulates without evidence of toe drag or knee instability. No assistive device.  Slow cadence, decreased step length  Motor strength is 4/5 in the right deltoid, biceps, triceps, grip, 3 minus at the left deltoid, biceps, triceps, grip, limited by pain  Psychiatric: Her affect is labile. Her speech is delayed. She is slowed. She exhibits abnormal recent memory and abnormal remote memory.  Nursing note and vitals reviewed.         Assessment & Plan:  1. Reflex sympathetic dystrophy left upper extremity, chronic, this [place] [facility] [place] by academic orthopedic surgeon. Also evaluated by academic in private practice neurologists. She is not interested in interventional procedures and at this point is unlikely to benefit, at least from stellate ganglion blocks. Would also run the risk of coming off of her Plavix. We discussed medication management. Given her CVA and unsteady gait. Would not recommend opioid treatment. Continue gabapentin 300 3 times a day Continue Cymbalta 20 mg a day. Add Voltaren gel 4 times a day to left upper extremity. We clarified that her allergy to diclofenac is only for oral tablets  Follow-up in 6 months Letter requested, provided 2 copies, recommending 0 step entry to apartment

## 2016-04-30 ENCOUNTER — Ambulatory Visit (INDEPENDENT_AMBULATORY_CARE_PROVIDER_SITE_OTHER): Payer: 59 | Admitting: *Deleted

## 2016-04-30 DIAGNOSIS — I639 Cerebral infarction, unspecified: Secondary | ICD-10-CM

## 2016-05-01 ENCOUNTER — Other Ambulatory Visit (INDEPENDENT_AMBULATORY_CARE_PROVIDER_SITE_OTHER): Payer: 59

## 2016-05-01 ENCOUNTER — Encounter: Payer: Self-pay | Admitting: Nurse Practitioner

## 2016-05-01 ENCOUNTER — Ambulatory Visit (INDEPENDENT_AMBULATORY_CARE_PROVIDER_SITE_OTHER): Payer: 59 | Admitting: Nurse Practitioner

## 2016-05-01 VITALS — BP 130/80 | HR 79 | Temp 98.3°F | Ht 62.0 in | Wt 175.0 lb

## 2016-05-01 DIAGNOSIS — F339 Major depressive disorder, recurrent, unspecified: Secondary | ICD-10-CM

## 2016-05-01 DIAGNOSIS — E1142 Type 2 diabetes mellitus with diabetic polyneuropathy: Secondary | ICD-10-CM | POA: Diagnosis not present

## 2016-05-01 DIAGNOSIS — E782 Mixed hyperlipidemia: Secondary | ICD-10-CM

## 2016-05-01 DIAGNOSIS — G5643 Causalgia of bilateral upper limbs: Secondary | ICD-10-CM

## 2016-05-01 DIAGNOSIS — E0590 Thyrotoxicosis, unspecified without thyrotoxic crisis or storm: Secondary | ICD-10-CM

## 2016-05-01 LAB — LIPID PANEL
Cholesterol: 240 mg/dL — ABNORMAL HIGH (ref 0–200)
HDL: 48 mg/dL (ref >39.00)
LDL Cholesterol: 172 mg/dL — ABNORMAL HIGH (ref 0–99)
NonHDL: 191.67
Total CHOL/HDL Ratio: 5
Triglycerides: 98 mg/dL (ref 0.0–149.0)
VLDL: 19.6 mg/dL (ref 0.0–40.0)

## 2016-05-01 LAB — COMPREHENSIVE METABOLIC PANEL
ALT: 19 U/L (ref 0–35)
AST: 12 U/L (ref 0–37)
Albumin: 4.3 g/dL (ref 3.5–5.2)
Alkaline Phosphatase: 70 U/L (ref 39–117)
BUN: 17 mg/dL (ref 6–23)
CO2: 31 mEq/L (ref 19–32)
Calcium: 10.4 mg/dL (ref 8.4–10.5)
Chloride: 102 mEq/L (ref 96–112)
Creatinine, Ser: 0.77 mg/dL (ref 0.40–1.20)
GFR: 96.12 mL/min (ref >60.00)
Glucose, Bld: 115 mg/dL — ABNORMAL HIGH (ref 70–99)
Potassium: 4.5 mEq/L (ref 3.5–5.1)
Sodium: 139 mEq/L (ref 135–145)
Total Bilirubin: 0.5 mg/dL (ref 0.2–1.2)
Total Protein: 7.4 g/dL (ref 6.0–8.3)

## 2016-05-01 LAB — HEMOGLOBIN A1C: Hgb A1c MFr Bld: 6.2 % (ref 4.6–6.5)

## 2016-05-01 MED ORDER — DULOXETINE HCL 30 MG PO CPEP: 30.0000 mg | ORAL_CAPSULE | Freq: Every day | ORAL | 1 refills | Status: DC

## 2016-05-01 NOTE — Progress Notes (Signed)
Carelink Summary Report / Loop Recorder 

## 2016-05-01 NOTE — Progress Notes (Signed)
Pre visit review using our clinic review tool, if applicable. No additional management support is needed unless otherwise documented below in the visit note. 

## 2016-05-01 NOTE — Progress Notes (Signed)
Subjective:  Patient ID: Kara Mendoza, female    DOB: 1950/05/02  Age: 66 y.o. MRN: [id number]  CC: Pain (left hand/shoulder pain raidate to right side. swelling going on for 1 wk. )   HPI  Ms. Jerde presents with persistent bilateral UE pain. Denies any improvement with gabapentin, cymbalta, and voltaren gel. She states she will like to have a prescription of hydrocodone. She denies any new symptoms.  Outpatient Medications Prior to Visit  Medication Sig Dispense Refill  . acetaminophen (TYLENOL) 500 MG tablet Take 2 tablets (1,000 mg total) by mouth every 6 (six) hours as needed. 30 tablet 0  . albuterol (PROVENTIL HFA;VENTOLIN HFA) 108 (90 Base) MCG/ACT inhaler Inhale 2 puffs into the lungs every 6 (six) hours as needed for wheezing or shortness of breath. 1 Inhaler 2  . atorvastatin (LIPITOR) 40 MG tablet TAKE 1 TABLET(40 MG) BY MOUTH DAILY AT 6 PM 90 tablet 2  . clopidogrel (PLAVIX) 75 MG tablet Take 1 tablet (75 mg total) by mouth daily. (Patient taking differently: Take 75 mg by mouth daily. Pt reports taking a old prescription of this medications as she ran out two days ago of her current prescription. Note the prescription was out of date as 01/2015.) 90 tablet 3  . diclofenac sodium (VOLTAREN) 1 % GEL Apply 2 g topically 4 (four) times daily. 3 Tube 5  . gabapentin (NEURONTIN) 300 MG capsule Take 1 capsule (300 mg total) by mouth 3 (three) times daily. 270 capsule 1  . Glucose Blood (BLOOD GLUCOSE TEST STRIPS) STRP Inject 1 Units into the skin 4 (four) times daily -  before meals and at bedtime. OneTouch Ultra mini strips (Patient taking differently: Inject 1 Units into the skin 4 (four) times daily -  before meals and at bedtime. OneTouch Ultra mini strips) 100 each 3  . losartan-hydrochlorothiazide (HYZAAR) 100-25 MG tablet TAKE 1 TABLET BY MOUTH DAILY 90 tablet 2  . metFORMIN (GLUCOPHAGE) 500 MG tablet TAKE 1 TABLET(500 MG) BY MOUTH DAILY WITH BREAKFAST 90 tablet 1  . Multiple  Vitamins-Calcium (ONE-A-DAY WOMENS FORMULA PO) Take 1 tablet by mouth daily.    Marland Kitchen omeprazole (PRILOSEC) 20 MG capsule TAKE 1 CAPSULE(20 MG) BY MOUTH TWICE DAILY 180 capsule 1  . ONETOUCH DELICA LANCETS FINE MISC 1 Units by Does not apply route 4 (four) times daily -  before meals and at bedtime. (Patient taking differently: 1 Units by Does not apply route 4 (four) times daily -  before meals and at bedtime. ) 100 each 3  . zolpidem (AMBIEN) 5 MG tablet Take 1 tablet (5 mg total) by mouth at bedtime as needed for sleep. 30 tablet 2  . DULoxetine (CYMBALTA) 20 MG capsule TAKE 1 CAPSULE(20 MG) BY MOUTH DAILY 90 capsule 2   No facility-administered medications prior to visit.     ROS See HPI  Objective:  BP 130/80   Pulse 79   Temp 98.3 F (36.8 C)   Ht 5\' 2"  (1.575 m)   Wt 175 lb (79.4 kg)   SpO2 99%   BMI 32.01 kg/m   BP Readings from Last 3 Encounters:  05/01/16 130/80  04/26/16 (!) 148/85  04/13/16 138/78    Wt Readings from Last 3 Encounters:  05/01/16 175 lb (79.4 kg)  04/13/16 173 lb (78.5 kg)  02/29/16 175 lb (79.4 kg)    Physical Exam  Constitutional: She is oriented to person, place, and time. No distress.  Neck: Normal range of motion. Neck  supple.  Cardiovascular: Normal rate, regular rhythm and normal heart sounds.   Pulmonary/Chest: Effort normal and breath sounds normal.  Musculoskeletal: She exhibits tenderness. She exhibits no edema.  Neurological: She is alert and oriented to person, place, and time.  Skin: Skin is warm and dry.  Vitals reviewed.   Lab Results  Component Value Date   WBC 14.7 (H) 12/07/2015   HGB 14.3 12/07/2015   HCT 42.0 12/07/2015   PLT 379 12/07/2015   GLUCOSE 146 (H) 12/07/2015   CHOL 225 (H) 11/29/2015   TRIG 126.0 11/29/2015   HDL 46.70 11/29/2015   LDLCALC 154 (H) 11/29/2015   ALT 23 12/07/2015   AST 20 12/07/2015   NA 137 12/07/2015   K 4.7 12/07/2015   CL 101 12/07/2015   CREATININE 0.80 12/07/2015   BUN 8  12/07/2015   CO2 27 12/07/2015   TSH 0.36 12/05/2015   INR 0.93 12/07/2015   HGBA1C 5.9 11/29/2015    Ct Head Wo Contrast  Result Date: 12/07/2015 CLINICAL DATA:  Headache with dizziness EXAM: CT HEAD WITHOUT CONTRAST TECHNIQUE: Contiguous axial images were obtained from the base of the skull through the vertex without intravenous contrast. COMPARISON:  MRI 08/10/2013, CT scan brain 08/09/2013 FINDINGS: Brain: Again visualized is hypodensity within the left posterior parietal lobe, unchanged and felt consistent with chronic area of infarction. There is no acute territorial infarction or intracranial hemorrhage. There is no focal mass, mass effect or midline shift. The ventricles are similar in size and morphology. Vascular: No hyperdense vessels. Mild carotid artery calcifications. Skull: Mastoid air cells are clear.  There is no fracture. Sinuses/Orbits: Minimal mucosal thickening in the sphenoid and ethmoid sinuses. No acute orbital abnormality. Other: None IMPRESSION: Chronic appearing left parietal infarct. No definite acute intracranial abnormality Electronically Signed   By: Jasmine Pang M.D.   On: 12/07/2015 23:44    Assessment & Plan:   Gunhild was seen today for pain.  Diagnoses and all orders for this visit:  Type 2 diabetes mellitus with diabetic polyneuropathy, without long-term current use of insulin (HCC) -     Hemoglobin A1c; Future -     Comprehensive metabolic panel; Future  Hyperthyroidism -     Thyroid Profile; Future  Mixed hyperlipidemia -     Comprehensive metabolic panel; Future -     Lipid panel; Future  Recurrent major depressive disorder, remission status unspecified (HCC) -     Thyroid Profile; Future -     DULoxetine (CYMBALTA) 30 MG capsule; Take 1 capsule (30 mg total) by mouth daily.  Complex regional pain syndrome type 2 of both upper extremities -     DULoxetine (CYMBALTA) 30 MG capsule; Take 1 capsule (30 mg total) by mouth daily.   I have  discontinued Ms. Rajagopalan DULoxetine and sertraline. I am also having her start on DULoxetine. Additionally, I am having her maintain her Multiple Vitamins-Calcium (ONE-A-DAY WOMENS FORMULA PO), zolpidem, albuterol, acetaminophen, clopidogrel, losartan-hydrochlorothiazide, atorvastatin, omeprazole, metFORMIN, ONETOUCH DELICA LANCETS FINE, gabapentin, BLOOD GLUCOSE TEST STRIPS, diclofenac sodium, and gabapentin.  Meds ordered this encounter  Medications  . DISCONTD: sertraline (ZOLOFT) 50 MG tablet    Sig: Take 50 mg by mouth 1 day or 1 dose.    Refill:  2  . gabapentin (NEURONTIN) 300 MG capsule    Sig: Take 900 mg by mouth 3 (three) times daily as needed.  . DULoxetine (CYMBALTA) 30 MG capsule    Sig: Take 1 capsule (30 mg total) by mouth daily.  Dispense:  90 capsule    Refill:  1    Order Specific Question:   Supervising Provider    Answer:   Tresa GarterPLOTNIKOV, ALEKSEI V [[id number]]    Follow-up: Return in about 3 months (around 08/01/2016) for DM and HTN, hyperlipidemia.  Alysia Pennaharlotte Bellanie Matthew, NP

## 2016-05-02 LAB — THYROID PANEL
Free Thyroxine Index: 1.7 (ref 1.2–4.9)
T3 Uptake Ratio: 22 % — ABNORMAL LOW (ref 24–39)
T4, Total: 7.8 ug/dL (ref 4.5–12.0)

## 2016-05-03 NOTE — Patient Instructions (Signed)
Advised patient o follow up with neurologist for pain management.

## 2016-05-12 LAB — CUP PACEART REMOTE DEVICE CHECK
Date Time Interrogation Session: 20180325033604
Implantable Pulse Generator Implant Date: 20150708

## 2016-05-17 ENCOUNTER — Encounter: Payer: Self-pay | Admitting: *Deleted

## 2016-05-17 ENCOUNTER — Other Ambulatory Visit: Payer: Self-pay | Admitting: *Deleted

## 2016-05-17 NOTE — Patient Outreach (Signed)
[facility]) Care Management   05/17/2016  Kara Mendoza 1950/08/24 [id number]  Kara Mendoza is an 66 y.o. female  Subjective:   HTN: Pt states she was sure what to do if symptoms should occur with a heart attack or stroke. Receptive to ongoing teaching and education on this topic. After reinforcement on this topic pt verbalized an understanding today. [facility] agency: Pt verified [facility] aide services continues to work with her however complains the current aide has limited knowledge on her cooking skills.  MEDICATION: Pt states she is no long taker the higher dosage of Neurotin and verified she is taking the 300 mg TID with no reported issues. Pt recent pickup her 90 days supply on her medications.   Objective:   Review of Systems  Constitutional: Negative.   HENT: Negative.   Eyes: Negative.   Respiratory: Negative.   Cardiovascular: Negative.   Gastrointestinal: Negative.   Genitourinary: Negative.   Musculoskeletal: Negative.   Skin: Negative.   Neurological: Negative.   Endo/Heme/Allergies: Negative.   Psychiatric/Behavioral: Negative.     Physical Exam  Vitals reviewed. Constitutional: She is oriented to person, place, and time. She appears well-developed and well-nourished.  HENT:  Right Ear: External ear normal.  Left Ear: External ear normal.  Eyes: EOM are normal.  Neck: Normal range of motion.  Cardiovascular: Normal heart sounds.   Respiratory: Effort normal and breath sounds normal.  GI: Soft. Bowel sounds are normal.  Musculoskeletal: Normal range of motion.  Neurological: She is alert and oriented to person, place, and time.  Skin: Skin is warm and dry.  Psychiatric: She has a normal mood and affect. Her behavior is normal. Judgment and thought content normal.    Encounter Medications:   Outpatient Encounter Prescriptions as of 05/17/2016  Medication Sig Note  . acetaminophen (TYLENOL) 500 MG tablet Take 2 tablets (1,000 mg total)  by mouth every 6 (six) hours as needed. 12/07/2015: Only used occasionally  . albuterol (PROVENTIL HFA;VENTOLIN HFA) 108 (90 Base) MCG/ACT inhaler Inhale 2 puffs into the lungs every 6 (six) hours as needed for wheezing or shortness of breath.   Marland Kitchen atorvastatin (LIPITOR) 40 MG tablet TAKE 1 TABLET(40 MG) BY MOUTH DAILY AT 6 PM   . clopidogrel (PLAVIX) 75 MG tablet Take 1 tablet (75 mg total) by mouth daily. (Patient taking differently: Take 75 mg by mouth daily. Pt reports taking a old prescription of this medications as she ran out two days ago of her current prescription. Note the prescription was out of date as 01/2015.) 12/07/2015: Per the patient's explanation, she last took her Plavix 2-3 weeks ago. She was under the assumption her doctor was going to switch her from Plavix to Xarelto. That never occurred. Kara Mendoza stated that her Plavix was re-filled (just) today and is awaiting pick up at her pharmacy. I called [facility] on [address] to verify this.  Marland Kitchen diclofenac sodium (VOLTAREN) 1 % GEL Apply 2 g topically 4 (four) times daily.   . DULoxetine (CYMBALTA) 30 MG capsule Take 1 capsule (30 mg total) by mouth daily.   Marland Kitchen gabapentin (NEURONTIN) 300 MG capsule Take 1 capsule (300 mg total) by mouth 3 (three) times daily.   . Glucose Blood (BLOOD GLUCOSE TEST STRIPS) STRP Inject 1 Units into the skin 4 (four) times daily -  before meals and at bedtime. OneTouch Ultra mini strips (Patient taking differently: Inject 1 Units into the skin 4 (four) times daily -  before meals and at  bedtime. OneTouch Ultra mini strips)   . losartan-hydrochlorothiazide (HYZAAR) 100-25 MG tablet TAKE 1 TABLET BY MOUTH DAILY   . metFORMIN (GLUCOPHAGE) 500 MG tablet TAKE 1 TABLET(500 MG) BY MOUTH DAILY WITH BREAKFAST   . Multiple Vitamins-Calcium (ONE-A-DAY WOMENS FORMULA PO) Take 1 tablet by mouth daily.   Marland Kitchen omeprazole (PRILOSEC) 20 MG capsule TAKE 1 CAPSULE(20 MG) BY MOUTH TWICE DAILY   . ONETOUCH DELICA LANCETS FINE MISC 1  Units by Does not apply route 4 (four) times daily -  before meals and at bedtime. (Patient taking differently: 1 Units by Does not apply route 4 (four) times daily -  before meals and at bedtime. )   . zolpidem (AMBIEN) 5 MG tablet Take 1 tablet (5 mg total) by mouth at bedtime as needed for sleep.   Marland Kitchen gabapentin (NEURONTIN) 300 MG capsule Take 900 mg by mouth 3 (three) times daily as needed.    No facility-administered encounter medications on file as of 05/17/2016.     Functional Status:   In your present state of health, do you have any difficulty performing the following activities: 02/21/2016 02/07/2016  Hearing? Kara Mendoza  Vision? N N  Difficulty concentrating or making decisions? Kara Mendoza  Walking or climbing stairs? Y Y  Dressing or bathing? Y Y  Doing errands, shopping? Y N  Preparing Food and eating ? Y Y  Using the Toilet? Y N  In the past six months, have you accidently leaked urine? Y Y  Do you have problems with loss of bowel control? N N  Managing your Medications? N N  Managing your Finances? N N  Housekeeping or managing your Housekeeping? Kara Mendoza  Some recent data might be hidden    Fall/Depression Screening:    PHQ 2/9 Scores 02/21/2016 02/07/2016 02/07/2016 01/23/2016 12/10/2014 05/01/2012 04/30/2012  PHQ - 2 Score 0  PHQ- 9 Score 8 - -   Blood pressure 120/78, pulse 78, resp. rate 20, SpO2 98 %.  Assessment:   Ongoing case management related to HTN Follow up on ongoing [facility] involvement related to aide services  Pharmacy related to medication (Neurotin).  Plan:  Physical assessment will be completed and abnormal symptoms will be reported accordingly. Will continues to educate pt on her medical condition concerning hypo-hypertension symptoms and what to do if acute. Will discussed signs of headaches due to pt's history with this symptoms and what to do if acute with no relief from medications or rest. Will verify pt is aware when to contact her provider and  review the FAST for potential stork signs and again what to do if acute.  Pt able to verified some signs of a heart attack and stroke and what to do if encountered.  Will verify ongoing aide services and encouraged pt to contact the agency if there are discrepancies in her requested services with this agency.  Will review all medications and verify with pt on her dosing of her Neurotin (verified current medication for 300 mg TID). Will review the plan of care and goal for pt's ongoing understanding. Will follow up telephonically in one month due to the need for ongoing reinforcement on possible signs and symptoms that maybe encountered related to HTN (stroke/heart attack) and what to do if these symptoms encountered (pt receptive).  Elliot Cousin, RN Care Management Coordinator [facility] Main Office [phone]

## 2016-05-18 ENCOUNTER — Other Ambulatory Visit: Payer: Self-pay | Admitting: Pharmacist

## 2016-05-18 NOTE — Patient Outreach (Signed)
[facility]) Care Management  05/18/2016  Kara Mendoza 1950-05-30 [id number]  Late entry for 05/17/16.    Received phone call from [facility] RN [place] with request to clarify patient's gabapentin due to multiple gabapentin entries on active medication list.  Patient was previously active with [facility].  Per review of medication list, gabapentin 300 mg three times daily was last prescribed by PCP 02/2016.  Per phone call to [facility] pharmacy [address] gabapentin 300 mg three times daily was filled 05/2016 and 02/2016.   There is an older recorded gabapentin 300 mg---900 mg threes times daily on medication list---appears this may have been an older medication dose from a previous medical provider based on review of notes in chart.    Per [facility] RN note from 05/17/16---patient reported taking gabapentin 300 mg three times daily.    Plan:  Will send in-basket message to PCP to clarify gabapentin 300 mg three times daily is correct dose.   Tommye Standard, PharmD, [facility] Clinical Pharmacist [facility] [phone]

## 2016-05-21 ENCOUNTER — Ambulatory Visit (INDEPENDENT_AMBULATORY_CARE_PROVIDER_SITE_OTHER): Payer: 59 | Admitting: Nurse Practitioner

## 2016-05-21 ENCOUNTER — Other Ambulatory Visit: Payer: Self-pay | Admitting: Pharmacist

## 2016-05-21 ENCOUNTER — Encounter: Payer: Self-pay | Admitting: Nurse Practitioner

## 2016-05-21 ENCOUNTER — Ambulatory Visit (INDEPENDENT_AMBULATORY_CARE_PROVIDER_SITE_OTHER)
Admission: RE | Admit: 2016-05-21 | Discharge: 2016-05-21 | Disposition: A | Discharge status: Home / Self Care | Payer: 59 | Source: Ambulatory Visit | Attending: Nurse Practitioner | Admitting: Nurse Practitioner

## 2016-05-21 VITALS — BP 120/74 | HR 79 | Temp 98.6°F | Ht 62.0 in | Wt 174.0 lb

## 2016-05-21 DIAGNOSIS — Z780 Asymptomatic menopausal state: Secondary | ICD-10-CM | POA: Diagnosis not present

## 2016-05-21 DIAGNOSIS — I10 Essential (primary) hypertension: Secondary | ICD-10-CM | POA: Diagnosis not present

## 2016-05-21 DIAGNOSIS — E1140 Type 2 diabetes mellitus with diabetic neuropathy, unspecified: Secondary | ICD-10-CM | POA: Diagnosis not present

## 2016-05-21 DIAGNOSIS — G5643 Causalgia of bilateral upper limbs: Secondary | ICD-10-CM

## 2016-05-21 DIAGNOSIS — G5612 Other lesions of median nerve, left upper limb: Secondary | ICD-10-CM

## 2016-05-21 DIAGNOSIS — J45998 Other asthma: Secondary | ICD-10-CM

## 2016-05-21 DIAGNOSIS — E0590 Thyrotoxicosis, unspecified without thyrotoxic crisis or storm: Secondary | ICD-10-CM

## 2016-05-21 DIAGNOSIS — G5640 Causalgia of unspecified upper limb: Secondary | ICD-10-CM

## 2016-05-21 DIAGNOSIS — K219 Gastro-esophageal reflux disease without esophagitis: Secondary | ICD-10-CM | POA: Diagnosis not present

## 2016-05-21 DIAGNOSIS — E782 Mixed hyperlipidemia: Secondary | ICD-10-CM

## 2016-05-21 DIAGNOSIS — F339 Major depressive disorder, recurrent, unspecified: Secondary | ICD-10-CM

## 2016-05-21 DIAGNOSIS — G8929 Other chronic pain: Secondary | ICD-10-CM

## 2016-05-21 DIAGNOSIS — M25562 Pain in left knee: Secondary | ICD-10-CM

## 2016-05-21 DIAGNOSIS — R471 Dysarthria and anarthria: Secondary | ICD-10-CM

## 2016-05-21 MED ORDER — OMEPRAZOLE 20 MG PO CPDR: DELAYED_RELEASE_CAPSULE | ORAL | 1 refills | Status: DC

## 2016-05-21 MED ORDER — METFORMIN HCL 500 MG PO TABS: ORAL_TABLET | ORAL | 1 refills | Status: DC

## 2016-05-21 MED ORDER — DULOXETINE HCL 30 MG PO CPEP: 30.0000 mg | ORAL_CAPSULE | Freq: Every day | ORAL | 1 refills | Status: DC

## 2016-05-21 MED ORDER — LOSARTAN POTASSIUM-HCTZ 100-25 MG PO TABS: 1.0000 | ORAL_TABLET | Freq: Every day | ORAL | 1 refills | Status: DC

## 2016-05-21 MED ORDER — GABAPENTIN 300 MG PO CAPS: 300.0000 mg | ORAL_CAPSULE | Freq: Three times a day (TID) | ORAL | 1 refills | Status: DC

## 2016-05-21 MED ORDER — ALBUTEROL SULFATE HFA 108 (90 BASE) MCG/ACT IN AERS
2.0000 | INHALATION_SPRAY | Freq: Four times a day (QID) | RESPIRATORY_TRACT | 2 refills | Status: DC | PRN
Start: 2016-05-21 — End: 2016-11-26

## 2016-05-21 MED ORDER — ATORVASTATIN CALCIUM 40 MG PO TABS: ORAL_TABLET | ORAL | 1 refills | Status: DC

## 2016-05-21 NOTE — Progress Notes (Signed)
Pre visit review using our clinic review tool, if applicable. No additional management support is needed unless otherwise documented below in the visit note. 

## 2016-05-21 NOTE — Progress Notes (Signed)
Subjective:    Patient ID: Kara Mendoza, female    DOB: 1950-08-16, 66 y.o.   MRN: [id number]  Patient presents today for complete physical   HPI   she denies any acute complain today.  Immunizations: (TDAP, Hep C screen, Pneumovax, Influenza, zoster)  Health Maintenance  Topic Date Due  .  Hepatitis C: One time screening is recommended by Center for Disease Control  (CDC) for  adults born from [birth year] through 1966.   08-24-1950  . DEXA scan (bone density measurement)  02/05/2017*  . Pneumonia vaccines (1 of 2 - PCV13) 02/12/2017*  . Flu Shot  09/05/2016  . Hemoglobin A1C  11/01/2016  . Mammogram  03/22/2017  . Eye exam for diabetics  03/28/2017  . Complete foot exam   04/04/2017  . Tetanus Vaccine  06/25/2018  . Colon Cancer Screening  12/31/2020  *Topic was postponed. The date shown is not the original due date.   Diet:regular Weight:  Wt Readings from Last 3 Encounters:  05/21/16 174 lb (78.9 kg)  05/01/16 175 lb (79.4 kg)  04/13/16 173 lb (78.5 kg)    Exercise:none Fall Risk: Fall Risk  04/26/2016 02/21/2016 02/07/2016 01/23/2016 12/23/2014 05/01/2012 04/30/2012  Falls in the past year? Yes Yes Yes No Yes Yes Yes  Number falls in past yr: 2 or more 2 or more 2 or more - 2 or more 2 or more 2 or more  Injury with Fall? No No No - - No Yes  Risk Factor Category  - High Fall Risk - - High Fall Risk High Fall Risk High Fall Risk  Risk for fall due to : Impaired balance/gait History of fall(s);Impaired balance/gait;Impaired mobility Impaired balance/gait;History of fall(s) - History of fall(s);Impaired balance/gait History of fall(s);Impaired balance/gait;Impaired vision Impaired balance/gait  Risk for fall due to (comments): - - - - - - Uses a cane  Follow up - Education provided;Falls prevention discussed Falls prevention discussed - - - -   Home Safety:home alone.  Depression/Suicide: Depression screen [facility] 2/9 02/21/2016 02/07/2016 02/07/2016 01/23/2016 12/10/2014 05/01/2012  04/30/2012  Decreased Interest 1 1 2 3 3 3  0  Down, Depressed, Hopeless 1 0 2 2 2 3  0  PHQ - 2 Score 2 1 4 5 5 6  0  Altered sleeping 1 - 0 0 2 0 -  Tired, decreased energy 1 - 3 3 3 3  -  Change in appetite 1 - 2 3 2 2  -  Feeling bad or failure about yourself  1 - 2 3 2  0 -  Trouble concentrating 1 - 1 3 2 3  -  Moving slowly or fidgety/restless 1 - 1 0 3 0 -  Suicidal thoughts 0 - 0 0 0 0 -  PHQ-9 Score 8 - 13 17 19 14  -  Difficult doing work/chores Extremely dIfficult - Extremely dIfficult Extremely dIfficult - - -  Some recent data might be hidden   No flowsheet data found. Colonoscopy (every 5-73yrs, >50-71yrs):states she had cologuard done last year.  Dexa (every 2-5yrs, >86yrs):needed  Mammogram (yearly, >34yrs):up to date.  Vision:up to date.  Dental:up to date.  Advanced Directive: Advanced Directives 02/21/2016  Does Patient Have a Medical Advance Directive? No  Would patient like information on creating a medical advance directive? No - Patient declined  Pre-existing out of facility DNR order (yellow form or pink MOST form) -    Medications and allergies reviewed with patient and updated if appropriate.  Patient Active Problem List   Diagnosis Date  Noted  . Residual cognitive deficit as late effect of stroke 04/26/2016  . Hemiparesis affecting left side as late effect of cerebrovascular accident (CVA) (HCC) 04/26/2016  . Carpal tunnel syndrome on left 11/29/2015  . DDD (degenerative disc disease), cervical 11/29/2015  . Depression 11/29/2015  . Chronic pain of left knee 09/04/2015  . Headache 09/04/2015  . Hypercalcemia 08/06/2015  . Pain in right upper arm 08/06/2015  . Paroxysmal atrial fibrillation (HCC) 07/22/2015  . History of loop recorder 04/11/2015  . History of CVA (cerebrovascular accident) 03/17/2015  . Shortness of breath 06/23/2014  . Complex regional pain syndrome type 2 of upper extremity 11/05/2013  . Carpal tunnel syndrome on right 10/22/2013    . Chronic embolism and thrombosis of deep vein of left upper extremity (HCC) 10/22/2013  . Left arm pain 10/22/2013  . Cardiac device in situ 10/14/2013  . Essential hypertension 09/30/2013  . Complex regional pain syndrome type 1 of both upper extremities 09/30/2013  . Diabetes (HCC) 09/28/2013  . Hyperthyroidism 09/28/2013  . Median nerve neuritis 09/28/2013  . Ataxia, late effect of cerebrovascular disease 09/28/2013  . Spastic hemiplegia affecting dominant side (HCC) 09/28/2013  . CVA (cerebral infarction) 08/15/2013  . Cerebral embolism with cerebral infarction (HCC) 08/09/2013  . Dysarthria 08/09/2013  . Abnormality of gait 05/01/2012  . Incontinence of urine 05/01/2012  . Tobacco abuse 01/28/2012  . Tobacco dependence syndrome 01/28/2012  . Fatigue 01/01/2011  . Weakness generalized 11/03/2010  . Plantar fasciitis 09/01/2010  . Allergic rhinitis 07/11/2010  . Persistent cough 06/16/2010  . Asthma night-time symptoms 06/14/2010  . GERD (gastroesophageal reflux disease) 05/26/2010  . Stroke (HCC) 05/26/2010  . Hyperlipidemia 05/26/2010  . Osteoarthritis 05/26/2010  . Mononeuritis 05/26/2010    Current Outpatient Prescriptions on File Prior to Visit  Medication Sig Dispense Refill  . acetaminophen (TYLENOL) 500 MG tablet Take 2 tablets (1,000 mg total) by mouth every 6 (six) hours as needed. 30 tablet 0  . clopidogrel (PLAVIX) 75 MG tablet Take 1 tablet (75 mg total) by mouth daily. (Patient taking differently: Take 75 mg by mouth daily. Pt reports taking a old prescription of this medications as she ran out two days ago of her current prescription. Note the prescription was out of date as 01/2015.) 90 tablet 3  . diclofenac sodium (VOLTAREN) 1 % GEL Apply 2 g topically 4 (four) times daily. 3 Tube 5  . Glucose Blood (BLOOD GLUCOSE TEST STRIPS) STRP Inject 1 Units into the skin 4 (four) times daily -  before meals and at bedtime. OneTouch Ultra mini strips (Patient taking  differently: Inject 1 Units into the skin 4 (four) times daily -  before meals and at bedtime. OneTouch Ultra mini strips) 100 each 3  . Multiple Vitamins-Calcium (ONE-A-DAY WOMENS FORMULA PO) Take 1 tablet by mouth daily.    Letta Pate DELICA LANCETS FINE MISC 1 Units by Does not apply route 4 (four) times daily -  before meals and at bedtime. (Patient taking differently: 1 Units by Does not apply route 4 (four) times daily -  before meals and at bedtime. ) 100 each 3  . zolpidem (AMBIEN) 5 MG tablet Take 1 tablet (5 mg total) by mouth at bedtime as needed for sleep. 30 tablet 2   No current facility-administered medications on file prior to visit.     Past Medical History:  Diagnosis Date  . Abnormality of gait   . Acute bronchitis   . Allergy   . Anxiety   .  Anxiety state, unspecified   . Asthma   . Chronic arm pain 2008   left  . Chronic headache   . Chronic leg pain 2008   left  . Chronic pain syndrome   . Constipation   . Diabetes mellitus (HCC)   . Dysarthria   . Esophageal reflux   . Fatigue   . History of fall   . Hyperlipidemia   . Hypertension   . Hyperthyroidism   . LOOP Recorder 10/14/2013  . Major depressive disorder, recurrent episode, moderate (HCC)   . Memory loss   . Neuromuscular disorder (HCC)   . Osteoarthritis   . Stroke (HCC)   . Tobacco use disorder     Past Surgical History:  Procedure Laterality Date  . CESAREAN SECTION     x3  . FINGER SURGERY    . LOOP RECORDER IMPLANT  08-12-2013   MDT LINQ implanted by Dr Graciela Husbands for cryptogenic stroke  . LOOP RECORDER IMPLANT N/A 08/12/2013   Procedure: LOOP RECORDER IMPLANT;  Surgeon: Duke Salvia, MD;  Location: [facility] CATH LAB;  Service: Cardiovascular;  Laterality: N/A;  . TEE WITHOUT CARDIOVERSION N/A 08/12/2013   Procedure: TRANSESOPHAGEAL ECHOCARDIOGRAM (TEE);  Surgeon: Wendall Stade, MD;  Location: [facility] ENDOSCOPY;  Service: Cardiovascular;  Laterality: N/A;    Social History   Social History  . Marital  status: Single    Spouse name: N/A  . Number of children: 3  . Years of education: College   Occupational History  .  Other    Disabled   Social History Main Topics  . Smoking status: Light Tobacco Smoker    Packs/day: 0.20    Years: 15.00    Types: Cigarettes  . Smokeless tobacco: Never Used     Comment: Tryin to quit, 07/08/14 smoking 3-4 a day  . Alcohol use No  . Drug use: No  . Sexual activity: Not Asked   Other Topics Concern  . None   Social History Narrative   Patient is single with 3 children.   Patient is right handed.   Patient has college education.   Patient drinks 2 cups daily.    Family History  Problem Relation Age of Onset  . Cancer Father   . Diabetes Sister   . Diabetes Brother         Review of Systems  Constitutional: Negative for fever, malaise/fatigue and weight loss.  HENT: Negative for congestion and sore throat.   Eyes:       Negative for visual changes  Respiratory: Negative for cough and shortness of breath.   Cardiovascular: Negative for chest pain, palpitations and leg swelling.  Gastrointestinal: Negative for blood in stool, constipation, diarrhea and heartburn.  Genitourinary: Negative for dysuria, frequency and urgency.  Musculoskeletal: Positive for back pain, falls and joint pain. Negative for myalgias.  Skin: Negative for rash.  Neurological: Negative for dizziness, sensory change and headaches.  Endo/Heme/Allergies: Does not bruise/bleed easily.  Psychiatric/Behavioral: Negative for depression, substance abuse and suicidal ideas. The patient is not nervous/anxious.     Objective:   Vitals:   05/21/16 1332  BP: 120/74  Pulse: 79  Temp: 98.6 F (37 C)    Body mass index is 31.83 kg/m.   Physical Examination:  Physical Exam  Constitutional: She is oriented to person, place, and time and well-developed, well-nourished, and in no distress. No distress.  HENT:  Right Ear: External ear normal.  Left Ear: External  ear normal.  Nose: Nose normal.  Mouth/Throat: Oropharynx is clear and moist. No oropharyngeal exudate.  Eyes: Conjunctivae and EOM are normal. Pupils are equal, round, and reactive to light. No scleral icterus.  Neck: Normal range of motion. Neck supple. No thyromegaly present.  Cardiovascular: Normal rate, regular rhythm, normal heart sounds and intact distal pulses.   Pulmonary/Chest: Effort normal and breath sounds normal. She exhibits no tenderness. Right breast exhibits no inverted nipple, no mass, no nipple discharge, no skin change and no tenderness. Left breast exhibits no inverted nipple, no mass, no nipple discharge, no skin change and no tenderness. Breasts are symmetrical.  Abdominal: Soft. Bowel sounds are normal. She exhibits no distension. There is no tenderness.  Genitourinary: Rectum normal and vulva normal. Cervix exhibits no motion tenderness.  Musculoskeletal: Normal range of motion. She exhibits no edema or tenderness.  Lymphadenopathy:    She has no cervical adenopathy.  Neurological: She is alert and oriented to person, place, and time. Gait normal.  Skin: Skin is warm and dry.  Psychiatric: Affect and judgment normal.  Vitals reviewed.   ASSESSMENT and PLAN:  Ysenia was seen today for follow-up.  Diagnoses and all orders for this visit:  Asymptomatic postmenopausal estrogen deficiency -     DG Bone Density; Future  Type 2 diabetes mellitus with diabetic neuropathy, without long-term current use of insulin (HCC) -     metFORMIN (GLUCOPHAGE) 500 MG tablet; TAKE 1 TABLET(500 MG) BY MOUTH DAILY WITH BREAKFAST  Mixed hyperlipidemia -     atorvastatin (LIPITOR) 40 MG tablet; TAKE 1 TABLET(40 MG) BY MOUTH DAILY AT 6 PM  Gastroesophageal reflux disease, esophagitis presence not specified -     omeprazole (PRILOSEC) 20 MG capsule; TAKE 1 CAPSULE(20 MG) BY MOUTH TWICE DAILY  Essential hypertension -     losartan-hydrochlorothiazide (HYZAAR) 100-25 MG tablet; Take 1  tablet by mouth daily.  Asthma night-time symptoms -     albuterol (PROVENTIL HFA;VENTOLIN HFA) 108 (90 Base) MCG/ACT inhaler; Inhale 2 puffs into the lungs every 6 (six) hours as needed for wheezing or shortness of breath.  Recurrent major depressive disorder, remission status unspecified (HCC) -     DULoxetine (CYMBALTA) 30 MG capsule; Take 1 capsule (30 mg total) by mouth daily.  Complex regional pain syndrome type 2 of both upper extremities -     DULoxetine (CYMBALTA) 30 MG capsule; Take 1 capsule (30 mg total) by mouth daily.  Neuritis of left median nerve -     gabapentin (NEURONTIN) 300 MG capsule; Take 1 capsule (300 mg total) by mouth 3 (three) times daily.  Complex regional pain syndrome type 2 of upper extremity, unspecified laterality -     gabapentin (NEURONTIN) 300 MG capsule; Take 1 capsule (300 mg total) by mouth 3 (three) times daily.  Dysarthria -     gabapentin (NEURONTIN) 300 MG capsule; Take 1 capsule (300 mg total) by mouth 3 (three) times daily.  Chronic pain of left knee -     gabapentin (NEURONTIN) 300 MG capsule; Take 1 capsule (300 mg total) by mouth 3 (three) times daily.  Hyperthyroidism    Essential hypertension BP at goal Continue current regimen BMP Latest Ref Rng & Units 05/01/2016 12/07/2015 12/07/2015  Glucose 70 - 99 mg/dL 161(W) 960(A) 540(J)  BUN 6 - 23 mg/dL Creatinine 0.40 - 1.20 mg/dL 8.11 9.14 7.82  Sodium 135 - 145 mEq/L 139 137 134(L)  Potassium 3.5 - 5.1 mEq/L 4.5 4.7 4.8  Chloride 96 - 112 mEq/L 102 101  103  CO2 19 - 32 mEq/L 31 - 27  Calcium 8.4 - 10.5 mg/dL 16.1 - 9.9    GERD (gastroesophageal reflux disease) Stable with omeprazole. No signs of GI bleed  Hyperthyroidism  Ref Range & Units 3wk ago  T4, Total 4.5 - 12.0 ug/dL 7.8   T3 Uptake Ratio 24 - 39 % 22    Free Thyroxine Index 1.2 - 4.9 1.7   Resulting Agency  [facility]  Narrative   Performed at: 01 - [facility] [place]     TSH 11/2015: 0.36  No  medication at this time  Hyperlipidemia Lipid Panel     Component Value Date/Time   CHOL 240 (H) 05/01/2016 1352   TRIG 98.0 05/01/2016 1352   HDL 48.00 05/01/2016 1352   CHOLHDL 5 05/01/2016 1352   VLDL 19.6 05/01/2016 1352   LDLCALC 172 (H) 05/01/2016 1352  she declines use of cholesterol lowering agent at this time despite high risk for CVD event.   Diabetes Last Hgb A1c 6.2. Controlled with metformin.      Follow up: Return in about 6 months (around 11/20/2016) for DM and HTN, hyperlipidemia.  Alysia Penna, NP

## 2016-05-21 NOTE — Patient Outreach (Signed)
[facility]) Care Management  05/21/2016  Kara Mendoza [birth year] [id number]  RE: med question  Received: 3 days ago  Message Contents  Anne Ng, NP  [name] [name], [facility]        Gabapentin  oral TID is the right one.   Previous Messages    ----- Message -----  From: Darreld Mclean, RPH  Sent: 05/18/2016  4:21 PM  To: Anne Ng, NP  Subject: med question                   Good afternoon Ms Nche:   [facility] Pharmacist received a call from [facility] RN to verify gabapentin dose on patient given duplicate listings on active medication list.   Per [facility] [address]---gabapentin 300 mg three times daily filled 05/2016 and 02/2016 and last prescribed by you 02/2016.   Appears gabapentin 900 mg three times daily may have been a previous dose----patient reported to [facility] RN taking 300 mg three times daily.   Can you confirm gabapentin 300 mg three times daily is accurate so medication list can be updated.   Thanks   Tommye Standard, PharmD, [facility]  Clinical Pharmacist  [facility]  [phone]      Received the above reply from patient's PCP, regarding gabapentin dose discrepancy.   Placed phone call to [facility] RN [facility] Misty Stanley M to update her.    Plan:  Will update active medication list in this chart to reflect dose of gabapentin as prescribed by patient's PCP.    Tommye Standard, PharmD, [facility] Clinical Pharmacist [facility] [phone]

## 2016-05-21 NOTE — Patient Instructions (Addendum)
If you have medicare related insurance (such as traditional Medicare, [employer], [employer], or similar), Please make an appointment at the scheduling desk with Noreene Larsson, the [employer], for your Wellness visit in this office, which is a benefit with your insurance.  Please schedule bone density at front desk.  You will be contacted about cologuard testing.  Please have [facility] fax a result of last diabetic foot exam.   Fall Prevention in the Home Falls can cause injuries and can affect people from all age groups. There are many simple things that you can do to make your home safe and to help prevent falls. What can I do on the outside of my home?  Regularly repair the edges of walkways and driveways and fix any cracks.  Remove high doorway thresholds.  Trim any shrubbery on the main path into your home.  Use bright outdoor lighting.  Clear walkways of debris and clutter, including tools and rocks.  Regularly check that handrails are securely fastened and in good repair. Both sides of any steps should have handrails.  Install guardrails along the edges of any raised decks or porches.  Have leaves, snow, and ice cleared regularly.  Use sand or salt on walkways during winter months.  In the garage, clean up any spills right away, including grease or oil spills. What can I do in the bathroom?  Use night lights.  Install grab bars by the toilet and in the tub and shower. Do not use towel bars as grab bars.  Use non-skid mats or decals on the floor of the tub or shower.  If you need to sit down while you are in the shower, use a plastic, non-slip stool.  Keep the floor dry. Immediately clean up any water that spills on the floor.  Remove soap buildup in the tub or shower on a regular basis.  Attach bath mats securely with double-sided non-slip rug tape.  Remove throw rugs and other tripping hazards from the floor. What can I do  in the bedroom?  Use night lights.  Make sure that a bedside light is easy to reach.  Do not use oversized bedding that drapes onto the floor.  Have a firm chair that has side arms to use for getting dressed.  Remove throw rugs and other tripping hazards from the floor. What can I do in the kitchen?  Clean up any spills right away.  Avoid walking on wet floors.  Place frequently used items in easy-to-reach places.  If you need to reach for something above you, use a sturdy step stool that has a grab bar.  Keep electrical cables out of the way.  Do not use floor polish or wax that makes floors slippery. If you have to use wax, make sure that it is non-skid floor wax.  Remove throw rugs and other tripping hazards from the floor. What can I do in the stairways?  Do not leave any items on the stairs.  Make sure that there are handrails on both sides of the stairs. Fix handrails that are broken or loose. Make sure that handrails are as long as the stairways.  Check any carpeting to make sure that it is firmly attached to the stairs. Fix any carpet that is loose or worn.  Avoid having throw rugs at the top or bottom of stairways, or secure the rugs with carpet tape to prevent them from moving.  Make sure that you have a  light switch at the top of the stairs and the bottom of the stairs. If you do not have them, have them installed. What are some other fall prevention tips?  Wear closed-toe shoes that fit well and support your feet. Wear shoes that have rubber soles or low heels.  When you use a stepladder, make sure that it is completely opened and that the sides are firmly locked. Have someone hold the ladder while you are using it. Do not climb a closed stepladder.  Add color or contrast paint or tape to grab bars and handrails in your home. Place contrasting color strips on the first and last steps.  Use mobility aids as needed, such as canes, walkers, scooters, and  crutches.  Turn on lights if it is dark. Replace any light bulbs that burn out.  Set up furniture so that there are clear paths. Keep the furniture in the same spot.  Fix any uneven floor surfaces.  Choose a carpet design that does not hide the edge of steps of a stairway.  Be aware of any and all pets.  Review your medicines with your healthcare provider. Some medicines can cause dizziness or changes in blood pressure, which increase your risk of falling. Talk with your health care provider about other ways that you can decrease your risk of falls. This may include working with a physical therapist or trainer to improve your strength, balance, and endurance. This information is not intended to replace advice given to you by your health care provider. Make sure you discuss any questions you have with your health care provider. Document Released: 01/12/2002 Document Revised: 06/21/2015 Document Reviewed: 02/26/2014 Elsevier Interactive Patient Education  2017 [employer].

## 2016-05-22 NOTE — Assessment & Plan Note (Signed)
Ref Range & Units 3wk ago  T4, Total 4.5 - 12.0 ug/dL 7.8   T3 Uptake Ratio 24 - 39 % 22    Free Thyroxine Index 1.2 - 4.9 1.7   Resulting Agency  [facility]  Narrative   Performed at: 01 - [facility] [place]     TSH 11/2015: 0.36  No medication at this time

## 2016-05-22 NOTE — Assessment & Plan Note (Signed)
Stable with omeprazole. No signs of GI bleed

## 2016-05-22 NOTE — Assessment & Plan Note (Signed)
Last Hgb A1c 6.2. Controlled with metformin.

## 2016-05-22 NOTE — Assessment & Plan Note (Signed)
Lipid Panel     Component Value Date/Time   CHOL 240 (H) 05/01/2016 1352   TRIG 98.0 05/01/2016 1352   HDL 48.00 05/01/2016 1352   CHOLHDL 5 05/01/2016 1352   VLDL 19.6 05/01/2016 1352   LDLCALC 172 (H) 05/01/2016 1352  she declines use of cholesterol lowering agent at this time despite high risk for CVD event.

## 2016-05-22 NOTE — Assessment & Plan Note (Signed)
BP at goal Continue current regimen BMP Latest Ref Rng & Units 05/01/2016 12/07/2015 12/07/2015  Glucose 70 - 99 mg/dL 161(W) 960(A) 540(J)  BUN 6 - 23 mg/dL Creatinine 0.40 - 1.20 mg/dL 8.11 9.14 7.82  Sodium 135 - 145 mEq/L 139 137 134(L)  Potassium 3.5 - 5.1 mEq/L 4.5 4.7 4.8  Chloride 96 - 112 mEq/L 102 101 103  CO2 19 - 32 mEq/L 31 - 27  Calcium 8.4 - 10.5 mg/dL 95.6 - 9.9

## 2016-05-28 ENCOUNTER — Ambulatory Visit (INDEPENDENT_AMBULATORY_CARE_PROVIDER_SITE_OTHER): Payer: 59 | Admitting: *Deleted

## 2016-05-28 DIAGNOSIS — I639 Cerebral infarction, unspecified: Secondary | ICD-10-CM | POA: Diagnosis not present

## 2016-05-28 LAB — COLOGUARD

## 2016-05-29 LAB — COLOGUARD: Cologuard: NEGATIVE

## 2016-05-29 NOTE — Progress Notes (Signed)
Carelink Summary Report / Loop Recorder 

## 2016-06-05 ENCOUNTER — Ambulatory Visit (INDEPENDENT_AMBULATORY_CARE_PROVIDER_SITE_OTHER): Payer: 59 | Admitting: Neurology

## 2016-06-05 ENCOUNTER — Encounter: Payer: Self-pay | Admitting: Neurology

## 2016-06-05 VITALS — BP 135/88 | HR 94 | Wt 172.2 lb

## 2016-06-05 DIAGNOSIS — M25562 Pain in left knee: Secondary | ICD-10-CM

## 2016-06-05 DIAGNOSIS — G5643 Causalgia of bilateral upper limbs: Secondary | ICD-10-CM

## 2016-06-05 DIAGNOSIS — G8929 Other chronic pain: Secondary | ICD-10-CM | POA: Diagnosis not present

## 2016-06-05 DIAGNOSIS — I63412 Cerebral infarction due to embolism of left middle cerebral artery: Secondary | ICD-10-CM

## 2016-06-05 DIAGNOSIS — F339 Major depressive disorder, recurrent, unspecified: Secondary | ICD-10-CM

## 2016-06-05 MED ORDER — GABAPENTIN 300 MG PO CAPS: 600.0000 mg | ORAL_CAPSULE | Freq: Three times a day (TID) | ORAL | 2 refills | Status: DC

## 2016-06-05 MED ORDER — DULOXETINE HCL 60 MG PO CPEP: 60.0000 mg | ORAL_CAPSULE | Freq: Every day | ORAL | 2 refills | Status: DC

## 2016-06-05 NOTE — Patient Instructions (Addendum)
-   continue plavix and statin for stroke prevention - check BP and glucose at home - prescribed lidocaine cream again for pain relieve. - Follow up with your primary care physician for stroke risk factor modification. Recommend maintain blood pressure goal <130/80, diabetes with hemoglobin A1c goal below 6.5% and lipids with LDL cholesterol goal below 70 mg/dL.  - increase cymbalta to  daily - increase gabapentin to  three times a day.  - quit smoking completely. - relaxation and de-stress are the key to pain control.  - follow up 3 months with me.

## 2016-06-05 NOTE — Progress Notes (Signed)
STROKE NEUROLOGY FOLLOW UP NOTE  NAME: Kara Mendoza DOB: 11-Jun-1950  REASON FOR VISIT: stroke follow up HISTORY FROM: pt and chart  Today we had the pleasure of seeing Kara Mendoza in follow-up at our Neurology Clinic. Pt was accompanied by no one.   History Summary Kara Mendoza is a 66 y.o. female with prior hx of left MCA distribution infarct in 2008 with residual language impairment, hypertension, dyslipidemia, tobacco abuse, admitted with stuttering speech and LUE pain and fluctuating weakness. Did not receive tPA due to unclear last known well. Imaging confirms chronic left parietal infarct with new extension into the left parietal operculum, left insula, and left inferior parietal lobe, surrounding the area of chronic infarction. Chronic microvascular ischemic change. 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 Stroke etiology felt to be embolic, though no source found (cryptogenic). Hypercoagulable work up so far unrevealing and TEE neg. Loop recorder inserted. She was discharged with plavix and zocor.  Follow up 09/29/13 - the patient has been doing better with speech although she will have stuttering if she is anxious. She still complains of LUE pain, painful to touch, stated that it sometimes swelling, cold, redness, which has been going for about one year. She will have outpt neurology referral by PCP to see Dr. Dionicia Abler at [facility] for this. She has Loop recorder implanted and so far she did not receive any phone call from cardiology.    Follow up 01/06/14 - she was doing a little bit better. TCD emboli detection negative. She had follow up with Dr. Dionicia Abler for left UE RDS and was put on gabapentin and discontinued lyrica. She felt pain is better controlled, however, she stated that combination of percocet and gabapentin is more effective but she has no percocet left. She needs to see Dr. Myna Hidalgo  for more percocet prescription. She also followed with cardiology and loop recorder so far no AF episodes. She also follows with Dr. Chestine Spore for thyroid issue for which she is taking methimazole. Otherwise, she has no complain of neurological deficit, she still has mild stuttering which is chronic. Pt continued smoking, although cutting down from 2PPD to 3 cig per day.  07/08/14 follow up - she still has pain on the left arm due to RSD. She followed up with Dr. Dionicia Abler in [facility] for RDS but she is currently seeing orthopedics in [facility] for left arm pain, concerning for RSD or CRPS in another term. She is taking Cymbalta, gabapentin and Lyrica for the pain. She has not been taking Plavix, because she was told that Plavix can cause stroke in her. No recurrent strokelike symptoms, blood pressure today 117/82.  12/23/14 follow up - she still has pain on the left arm due to RSD. She has been seen by Dr. Dionicia Abler in [facility] and also [facility] orthopedics for this. As per pt, she was recommended to follow up with [facility] neurology for some new procedures for treatment of RSD. She has not seen [facility] neurology yet. She continued to experience pain in the left arm, swelling, hot feeling, up to the upper arm. Lidocaine cream helped but she ran out of it. I encouraged her to continue follow up with [facility] neurology for RSD and will refill her lidocaine cream.   Interval History During the interval time, pt has been doing well from stroke standpoint. She went to [facility] supposedly for her RDS but was seen for  stroke follow up. She decided to stop seeing neurologist in [facility] or [place] and would like to stick with local [place] neurologist for her RDS. She continued to have left arm pain to touch, swelling in hands, involving entire arm. Using lidocaine cream helps, also on gabapentin and cymbalta, but percocet was discontinued from her PCP. Recently, she started to have similar pain in right arm and hand, but mild, with intermittent right hand  swelling. She was afraid that her right arm will get the same thing. She denies any stress, anxiety but admitted that she has some issues with her landlord for her apartment. BP today 135/88.   REVIEW OF SYSTEMS: Full 14 system review of systems performed and notable only for those listed below and in HPI above, all others are negative:  Constitutional: Activity change, fatigue, and unexpected weight change  Cardiovascular: palpitation  Ear/Nose/Throat: Ringing in ears  Skin: Moles  Eyes: blurry vision, double vision, eye discharge  Respiratory: N/A  Gastroitestinal: Swallow abdomen, constipation  Genitourinary: Incontinence of bladder, frequency of urination, urgency Hematology/Lymphatic: Bruce/bleed easily Endocrine: N/A  Musculoskeletal: joint pain, aching muscles , back pain, walking difficulty Allergy/Immunology: N/A  Neurological: Numbness, headache, memory loss, speech difficulty, weakness  Psychiatric: insomnia, restless leg, snoring, depression, nervous/anxious  The following represents the patient's updated allergies and side effects list: Allergies  Allergen Reactions  . Aspirin Hives, Itching and Other (See Comments)  . Diclofenac Shortness Of Breath    Only with po diclofenac  . Penicillins Hives    Has patient had a PCN reaction causing immediate rash, facial/tongue/throat swelling, SOB or lightheadedness with hypotension: Yes Has patient had a PCN reaction causing severe rash involving mucus membranes or skin necrosis: No Has patient had a PCN reaction that required hospitalization No Has patient had a PCN reaction occurring within the last 10 years: No If all of the above answers are "NO", then may proceed with Cephalosporin use.     Labs since last visit of relevance include the following: Results for orders placed or performed in visit on 05/01/16  Hemoglobin A1c  Result Value Ref Range   Hgb A1c MFr Bld 6.2 4.6 - 6.5 %  Thyroid Profile  Result Value Ref Range    T4, Total 7.8 4.5 - 12.0 ug/dL   T3 Uptake Ratio 22 (L) 24 - 39 %   Free Thyroxine Index 1.7 1.2 - 4.9  Comprehensive metabolic panel  Result Value Ref Range   Sodium 139 135 - 145 mEq/L   Potassium 4.5 3.5 - 5.1 mEq/L   Chloride 102 96 - 112 mEq/L   CO2 31 19 - 32 mEq/L   Glucose, Bld 115 (H) 70 - 99 mg/dL   BUN 17 6 - 23 mg/dL   Creatinine, Ser 0.45 0.40 - 1.20 mg/dL   Total Bilirubin 0.5 0.2 - 1.2 mg/dL   Alkaline Phosphatase 70 39 - 117 U/L   AST 12 0 - 37 U/L   ALT 19 0 - 35 U/L   Total Protein 7.4 6.0 - 8.3 g/dL   Albumin 4.3 3.5 - 5.2 g/dL   Calcium 40.9 8.4 - 81.1 mg/dL   GFR 91.47 >82.95 mL/min  Lipid panel  Result Value Ref Range   Cholesterol 240 (H) 0 - 200 mg/dL   Triglycerides 62.1 0.0 - 149.0 mg/dL   HDL 30.86 >57.84 mg/dL   VLDL 69.6 0.0 - 29.5 mg/dL   LDL Cholesterol 284 (H) 0 - 99 mg/dL   Total CHOL/HDL Ratio 5  NonHDL 191.67     The neurologically relevant items on the patient's problem list were reviewed on today's visit.  Neurologic Examination  A problem focused neurological exam (12 or more points of the single system neurologic examination, vital signs counts as 1 point, cranial nerves count for 8 points) was performed.  Blood pressure 135/88, pulse 94, weight 172 lb 3.2 oz (78.1 kg).  General - Well nourished, well developed, in no apparent distress.  Ophthalmologic - not able to see through.  Cardiovascular - Regular rate and rhythm with no murmur.  Mental Status -  Level of arousal and orientation to time, place, and person were intact. Language including expression, naming, repetition, comprehension was assessed and found intact., but with stuttering if anxious  Cranial Nerves II - XII - II - Visual field intact OU. III, IV, VI - Extraocular movements intact. V - Facial sensation intact bilaterally. VII - Facial movement intact bilaterally. VIII - Hearing & vestibular intact bilaterally. X - Palate elevates symmetrically, mild  stuttering. XI - Chin turning & shoulder shrug intact bilaterally. XII - Tongue protrusion intact.  Motor Strength - The patient's strength was normal in all extremities and pronator drift was absent.  Bulk was normal and fasciculations were absent.  Motor Tone - Muscle tone was assessed at the neck and appendages and was normal.  Reflexes - The patient's reflexes were 1+ in all extremities and she had no pathological reflexes.  Sensory - sensitive and painful on touch at left entire arm, mild painful on touch at right thumb.  Coordination - The patient had normal movements in the hands with no ataxia or dysmetria.  Tremor was absent.  Gait and Station - normal gait.  Data reviewed: I personally reviewed the images and agree with the radiology interpretations.  Dg Chest 2 View  08/09/2013 IMPRESSION: No active cardiopulmonary disease.  Ct Head Wo Contrast  08/09/2013 IMPRESSION: Stable chronic left MCA distribution infarction. No acute intracranial abnormality.  Ct Angio Head and neck W/cm &/or Wo Cm  08/09/2013 IMPRESSION: Negative study. No evidence of atherosclerotic narrowing or dissection. No vessel occlusion. No aneurysm or vascular malformation.  MRI/MRA  08/10/2013 IMPRESSION: Chronic left parietal infarct. Chronic microvascular ischemic change. Extension of acute infarct into the left parietal operculum, left insula, and left inferior parietal lobe, surrounding the area of chronic infarction. Small irregular distal left vertebral artery. 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  Carotid Doppler CTA neck done  2D Echocardiogram 08/10/13 EF 55-60%, no cardiac source of emboli identified  LUE doppler There is no DVT or SVT noted in the left upper extremity.  Transesophageal Echocardiogram 08/12/13 Impression:  1) No source of embolus  2) Negative bubble study  3) Normal EF 60%  4) No significant valve disease  5)  No LAA thrombus  6) No aortic debris  7) Normal RV  8) No effusion  EKG 08/09/13 Sinus rhythm  RAE, consider biatrial enlargement  Anteroseptal infarct, old   Loop recorder - no afib  Component     Latest Ref Rng 08/11/2013  Anticardiolipin IgA     <22 APL U/mL 3 (L)  Anticardiolipin IgG     <23 GPL U/mL 2 (L)  Anticardiolipin IgM     <11 MPL U/mL 3 (L)  PTT Lupus Anticoagulant     28.0 - 43.0 secs 33.0  PTTLA Confirmation     <8.0 secs NOT APPL  PTTLA 4:1 Mix     28.0 - 43.0 secs NOT APPL  DRVVT     <42.9 secs 32.5  Drvvt confirmation     <1.15 Ratio NOT APPL  dRVVT Incubated 1:1 Mix     <42.9 secs NOT APPL  Lupus Anticoagulant     NOT DETECTED NOT DETECTED  Phosphatydalserine, IgG     <16 U/mL 4  Phosphatydalserine, IgM     <22 U/mL 7  Phosphatydalserine, IgA     <20 U/mL 5  Total Protein     6.0 - 8.3 g/dL 6.6  Albumin     3.5 - 5.2 g/dL 3.7  AST     0 - 37 U/L 14  ALT     0 - 35 U/L 15  Alkaline Phosphatase     39 - 117 U/L 59  Total Bilirubin     0.3 - 1.2 mg/dL 0.6  Bilirubin, Direct     0.0 - 0.3 mg/dL <1.0  Indirect Bilirubin     0.3 - 0.9 mg/dL NOT CALCULATED  c-ANCA Screen     NEGATIVE NEGATIVE  p-ANCA Screen     NEGATIVE NEGATIVE  Atypical p-ANCA Screen     NEGATIVE NEGATIVE  TSH     0.350 - 4.500 uIU/mL   Vitamin B-12     211 - 911 pg/mL 1016 (H)  Folate      >20.0  RPR     NON REAC NON REAC  HIV     NONREACTIVE NONREACTIVE  Sed Rate     0 - 22 mm/hr 3  CRP, High Sensitivity      1.3  C3 Complement     90 - 180 mg/dL 960  Complement C4, Body Fluid     10 - 40 mg/dL 28  ds DNA Ab      <1  Homocysteine     4.0 - 15.4 umol/L 6.5  SSA (Ro) (ENA) Antibody, IgG     <1.0 NEG AI <1.0 NEG  SSB (La) (ENA) Antibody, IgG     <1.0 NEG AI <1.0 NEG  Rheumatoid Factor     <=14 IU/mL <10    Assessment: As you may recall, she is a 66 y.o. African American female with PMH of left MCA distribution infarct in 2008 with residual language  impairment, hypertension, dyslipidemia, tobacco abuse, found to have again left parietal and frontal small embolic pattern strokes, appears like extension from previous 2008 stroke. Felt like embolic, work up so far negative and hypercoag labs were not remarkable. Consider cryptogenic stroke. Loop recorder so far negative for AF episodes. TCD emboli monitoring negative for MES. Continue plavix and statin. She does have left arm RSD or CRPS with developing mild RSD at right thumb, and she has decided to follow-up here instead of [facility] or [facility]. Will increase her gabapentin to 600 mg 3 times a day and Cymbalta to 60 mg. If not effective, may consider nortriptyline add on.   Plan:  - continue plavix and statin for stroke prevention - check BP and glucose at home - Continue lidocaine cream for pain relieve. - Follow up with your primary care physician for stroke risk factor modification. Recommend maintain blood pressure goal <130/80, diabetes with hemoglobin A1c goal below 6.5% and lipids with LDL cholesterol goal below 70 mg/dL.  - increase cymbalta to  daily - increase gabapentin to  three times a day.  - If not effective, may consider nortriptyline add on. - quit smoking completely. - relaxation  and de-stress are the key to pain control.  - follow up 3 months with me.  I spent more than 25 minutes of face to face time with the patient. Greater than 50% of time was spent in counseling and coordination of care. We have discussed about the etiology, treatment option and prognosis of RSD or CRPS.    No orders of the defined types were placed in this encounter.  Meds ordered this encounter  Medications  . DULoxetine (CYMBALTA) 60 MG capsule    Sig: Take 1 capsule (60 mg total) by mouth daily.    Dispense:  30 capsule    Refill:  2  . gabapentin (NEURONTIN) 300 MG capsule    Sig: Take 2 capsules (600 mg total) by mouth 3 (three) times daily.    Dispense:  180 capsule    Refill:  2     Patient Instructions  - continue plavix and statin for stroke prevention - check BP and glucose at home - prescribed lidocaine cream again for pain relieve. - Follow up with your primary care physician for stroke risk factor modification. Recommend maintain blood pressure goal <130/80, diabetes with hemoglobin A1c goal below 6.5% and lipids with LDL cholesterol goal below 70 mg/dL.  - increase cymbalta to  daily - increase gabapentin to  three times a day.  - quit smoking completely. - relaxation and de-stress are the key to pain control.  - follow up 3 months with me.   [name], MD PhD [facility] [address] [place], [place] [address] [phone]

## 2016-06-08 ENCOUNTER — Encounter: Payer: Self-pay | Admitting: Internal Medicine

## 2016-06-08 ENCOUNTER — Encounter: Payer: Self-pay | Admitting: Nurse Practitioner

## 2016-06-08 NOTE — Progress Notes (Signed)
Abstracted result and sent to scan  

## 2016-06-12 LAB — CUP PACEART REMOTE DEVICE CHECK
Date Time Interrogation Session: 20180424040622
Implantable Pulse Generator Implant Date: 20150708

## 2016-06-15 ENCOUNTER — Other Ambulatory Visit: Payer: Self-pay | Admitting: *Deleted

## 2016-06-15 ENCOUNTER — Encounter: Payer: Self-pay | Admitting: *Deleted

## 2016-06-15 NOTE — Patient Outreach (Signed)
[facility]) Care Management  06/15/2016  TERRIANNE CAVNESS Apr 25, 1950 [id number]   RN spoke with pt today with the last contact. Pt reports she is doing well with no major issues. RN review all goals as pt reports she is managing her care well however will notify the current aide agency and request another worker due to the lack of assistance she feels she is getting at this time. Pt feels comfortable making this outreach. Reports he blood pressures remains normal and she is taking all her prescribed medications with no additional problems. Pt states he continues to drive herself to and from all medical appointments with no problems however trying to inquire further on possible apartments near her daughter but she is unable to afford the required deposit to obtain a new apartment. Medically pt is doing well and continues to attend all appointments with no delays, reports patient. RN offered any other assistance or community resources at this time as pt indicated no other needs. Based upon pt's management of care this case will be closed via ongoing case management services. Pt aware that RN will notify her provided of case closure and that she is able to contact [facility] if she needs services in the further. Pt indicated she is very grateful for the services provided. No additional needs with goals met via plan of care.  Raina Mina, RN Care Management Coordinator [place] Office [phone]

## 2016-06-21 NOTE — Progress Notes (Signed)
Pre visit review using our clinic review tool, if applicable. No additional management support is needed unless otherwise documented below in the visit note. 

## 2016-06-21 NOTE — Progress Notes (Signed)
Subjective:   Kara Mendoza is a 66 y.o. female who presents for Medicare Annual (Subsequent) preventive examination.  Review of Systems:  No ROS.  Medicare Wellness Visit.  Cardiac Risk Factors include: advanced age (>72men, >69 women);diabetes mellitus;dyslipidemia;hypertension;sedentary lifestyle  Sleep patterns: has difficulty awakening, feels rested on waking, gets up 1-2 times nightly to void and sleeps 5-6 hours nightly.  Patient reports long-term insomnia issues, discussed recommended sleep tips and stress reduction tips, education was attached to patient's AVS.  Home Safety/Smoke Alarms: Feels safe in home. Smoke alarms in place.    Living environment; residence and Solicitor: apartment, equipment: Medical laboratory scientific officer, Type: Single Point [place], [place], Type: Agricultural consultant, [employer], Type: Tub Dentist and wheelchair, no firearms. Seat Belt Safety/Bike Helmet: Wears seat belt.   Counseling:   Eye Exam- appointment yearly Dental- resources provided  Female:   Pap- N/A       Mammo-  Last 08/04/13,   BI-RADS CATEGORY  1: Negative   Dexa scan-  Last 05/21/16, normal      CCS-  Last cologuard, 05/29/16, negative     Objective:     Vitals: BP (!) 152/78   Pulse 91   Resp 20   Ht 5\' 2"  (1.575 m)   Wt 170 lb (77.1 kg)   SpO2 100%   BMI 31.09 kg/m   Body mass index is 31.09 kg/m.   Tobacco History  Smoking Status  . Light Tobacco Smoker  . Packs/day: 0.20  . Years: 15.00  . Types: Cigarettes  Smokeless Tobacco  . Never Used    Comment:  smoking 3-4 a day     Ready to quit: Not Answered Counseling given: Not Answered   Past Medical History:  Diagnosis Date  . Abnormality of gait   . Acute bronchitis   . Allergy   . Anxiety   . Anxiety state, unspecified   . Asthma   . Chronic arm pain 2008   left  . Chronic headache   . Chronic leg pain 2008   left  . Chronic pain syndrome   . Constipation   . Diabetes mellitus (HCC)   . Dysarthria   . Esophageal  reflux   . Fatigue   . History of fall   . Hyperlipidemia   . Hypertension   . Hyperthyroidism   . LOOP Recorder 10/14/2013  . Major depressive disorder, recurrent episode, moderate (HCC)   . Memory loss   . Neuromuscular disorder (HCC)   . Osteoarthritis   . Stroke (HCC)   . Tobacco use disorder    Past Surgical History:  Procedure Laterality Date  . CESAREAN SECTION     x3  . FINGER SURGERY    . LOOP RECORDER IMPLANT  08-12-2013   MDT LINQ implanted by Dr Graciela Husbands for cryptogenic stroke  . LOOP RECORDER IMPLANT N/A 08/12/2013   Procedure: LOOP RECORDER IMPLANT;  Surgeon: Duke Salvia, MD;  Location: [facility] CATH LAB;  Service: Cardiovascular;  Laterality: N/A;  . TEE WITHOUT CARDIOVERSION N/A 08/12/2013   Procedure: TRANSESOPHAGEAL ECHOCARDIOGRAM (TEE);  Surgeon: Wendall Stade, MD;  Location[facility] ENDOSCOPY;  Service: Cardiovascular;  Laterality: N/A;   Family History  Problem Relation Age of Onset  . Cancer Father   . Diabetes Sister   . Diabetes Brother    History  Sexual Activity  . Sexual activity: Not on file    Outpatient Encounter Prescriptions as of 06/22/2016  Medication Sig  . acetaminophen (TYLENOL) 500 MG tablet Take  2 tablets (1,000 mg total) by mouth every 6 (six) hours as needed.  Marland Kitchen. albuterol (PROVENTIL HFA;VENTOLIN HFA) 108 (90 Base) MCG/ACT inhaler Inhale 2 puffs into the lungs every 6 (six) hours as needed for wheezing or shortness of breath.  Marland Kitchen. atorvastatin (LIPITOR) 40 MG tablet TAKE 1 TABLET(40 MG) BY MOUTH DAILY AT 6 PM  . clopidogrel (PLAVIX) 75 MG tablet Take 1 tablet (75 mg total) by mouth daily. (Patient taking differently: Take 75 mg by mouth daily. Pt reports taking a old prescription of this medications as she ran out two days ago of her current prescription. Note the prescription was out of date as 01/2015.)  . diclofenac sodium (VOLTAREN) 1 % GEL Apply 2 g topically 4 (four) times daily.  . DULoxetine (CYMBALTA) 60 MG capsule Take 1 capsule (60 mg total)  by mouth daily.  Marland Kitchen. gabapentin (NEURONTIN) 300 MG capsule Take 2 capsules (600 mg total) by mouth 3 (three) times daily.  . Glucose Blood (BLOOD GLUCOSE TEST STRIPS) STRP Inject 1 Units into the skin 4 (four) times daily -  before meals and at bedtime. OneTouch Ultra mini strips (Patient taking differently: Inject 1 Units into the skin 4 (four) times daily -  before meals and at bedtime. OneTouch Ultra mini strips)  . losartan-hydrochlorothiazide (HYZAAR) 100-25 MG tablet Take 1 tablet by mouth daily.  . metFORMIN (GLUCOPHAGE) 500 MG tablet TAKE 1 TABLET(500 MG) BY MOUTH DAILY WITH BREAKFAST  . Multiple Vitamins-Calcium (ONE-A-DAY WOMENS FORMULA PO) Take 1 tablet by mouth daily.  Marland Kitchen. omeprazole (PRILOSEC) 20 MG capsule TAKE 1 CAPSULE(20 MG) BY MOUTH TWICE DAILY  . ONETOUCH DELICA LANCETS FINE MISC 1 Units by Does not apply route 4 (four) times daily -  before meals and at bedtime. (Patient taking differently: 1 Units by Does not apply route 4 (four) times daily -  before meals and at bedtime. )  . zolpidem (AMBIEN) 5 MG tablet Take 1 tablet (5 mg total) by mouth at bedtime as needed for sleep.   No facility-administered encounter medications on file as of 06/22/2016.     Activities of Daily Living In your present state of health, do you have any difficulty performing the following activities: 06/22/2016 02/21/2016  Hearing? N Y  Vision? N N  Difficulty concentrating or making decisions? N Y  Walking or climbing stairs? Y Y  Dressing or bathing? N Y  Doing errands, shopping? Malvin JohnsY Y  Preparing Food and eating ? N Y  Using the Toilet? N Y  In the past six months, have you accidently leaked urine? N Y  Do you have problems with loss of bowel control? N N  Managing your Medications? N N  Managing your Finances? N N  Housekeeping or managing your Housekeeping? Malvin JohnsY Y  Some recent data might be hidden    Patient Care Team: Nche, Bonna Gainsharlotte Lum, NP as PCP - General (Internal Medicine)    Assessment:      Physical assessment deferred to PCP.  Exercise Activities and Dietary recommendations Current Exercise Habits: The patient does not participate in regular exercise at present (chair exercise pamphlets provided), Exercise limited by: Other - see comments (left hand severe neuropathy)  Diet (meal preparation, eat out, water intake, caffeinated beverages, dairy products, fruits and vegetables): in general, an "unhealthy" diet , drinks mainly water 5-6 bottles per day, eats a lot of junk food and prepared foods.   Reviewed and educated patient about low fat/cholesterol, low salt, and diabetic diet, discussed porion control,  reading labels, encouraged patient to eat more fruits and vegetables and less prepared and junk food.  Diet education was attached to patient's AVS.  Goals    . Blood Pressure < 140/90    . I want to sing          I will continue to travel with my brother and my church choir, I will play my CDs at home and sing.    Marland Kitchen LDL CALC < 100    . Prevent Falls    . Quit smoking / using tobacco      Fall Risk Fall Risk  06/22/2016 06/05/2016 04/26/2016 02/21/2016 02/07/2016  Falls in the past year? Yes Yes Yes Yes Yes  Number falls in past yr: 2 or more 2 or more 2 or more 2 or more 2 or more  Injury with Fall? - No No No No  Risk Factor Category  - - - High Fall Risk -  Risk for fall due to : Impaired mobility;Impaired balance/gait - Impaired balance/gait History of fall(s);Impaired balance/gait;Impaired mobility Impaired balance/gait;History of fall(s)  Risk for fall due to (comments): - - - - -  Follow up Falls prevention discussed - - Education provided;Falls prevention discussed Falls prevention discussed   Depression Screen PHQ 2/9 Scores 06/22/2016 02/21/2016 02/07/2016 02/07/2016  PHQ - 2 Score 2 2 1 4   PHQ- 9 Score 5 8 - 13     Cognitive Function MMSE - Mini Mental State Exam 06/22/2016  Orientation to time 5  Orientation to Place 5  Registration 3  Attention/ Calculation 5   Recall 3  Language- name 2 objects 2  Language- repeat 1  Language- follow 3 step command 3  Language- read & follow direction 1  Write a sentence 1  Copy design 1  Total score 30         There is no immunization history on file for this patient. Screening Tests Health Maintenance  Topic Date Due  . PNA vac Low Risk Adult (1 of 2 - PCV13) 02/12/2017 (Originally 03/05/2014)  . Hepatitis C Screening  06/22/2017 (Originally 1950/09/29)  . INFLUENZA VACCINE  09/05/2016  . HEMOGLOBIN A1C  11/01/2016  . MAMMOGRAM  03/22/2017  . OPHTHALMOLOGY EXAM  03/28/2017  . FOOT EXAM  04/04/2017  . TETANUS/TDAP  06/25/2018  . Fecal DNA (Cologuard)  05/30/2019  . DEXA SCAN  Completed      Plan:  Continue to eat heart healthy diet (full of fruits, vegetables, whole grains, lean protein, water--limit salt, fat, and sugar intake) and increase physical activity as tolerated.  Continue doing brain stimulating activities (puzzles, reading, adult coloring books, staying active) to keep memory sharp.   Resources for [employer] provided  I have personally reviewed and noted the following in the patient's chart:   . Medical and social history . Use of alcohol, tobacco or illicit drugs  . Current medications and supplements . Functional ability and status . Nutritional status . Physical activity . Advanced directives . List of other physicians . Vitals . Screenings to include cognitive, depression, and falls . Referrals and appointments  In addition, I have reviewed and discussed with patient certain preventive protocols, quality metrics, and best practice recommendations. A written personalized care plan for preventive services as well as general preventive health recommendations were provided to patient.     Wanda Plump, RN  06/22/2016

## 2016-06-22 ENCOUNTER — Ambulatory Visit (INDEPENDENT_AMBULATORY_CARE_PROVIDER_SITE_OTHER): Payer: 59 | Admitting: *Deleted

## 2016-06-22 VITALS — BP 152/78 | HR 91 | Resp 20 | Ht 62.0 in | Wt 170.0 lb

## 2016-06-22 DIAGNOSIS — Z0000 Encounter for general adult medical examination without abnormal findings: Secondary | ICD-10-CM

## 2016-06-22 NOTE — Patient Instructions (Addendum)
Continue to eat heart healthy diet (full of fruits, vegetables, whole grains, lean protein, water--limit salt, fat, and sugar intake) and increase physical activity as tolerated.  Continue doing brain stimulating activities (puzzles, reading, adult coloring books, staying active) to keep memory sharp.    Kara Mendoza , Thank you for taking time to come for your Medicare Wellness Visit. I appreciate your ongoing commitment to your health goals. Please review the following plan we discussed and let me know if I can assist you in the future.   These are the goals we discussed: Goals    . Blood Pressure < 140/90    . I want to sing          I will continue to travel with my brother and my church choir, I will play my CDs at home and sing.    Marland Kitchen LDL CALC < 100    . Prevent Falls    . Quit smoking / using tobacco       This is a list of the screening recommended for you and due dates:  Health Maintenance  Topic Date Due  .  Hepatitis C: One time screening is recommended by Center for Disease Control  (CDC) for  adults born from [birth year] through 1966.   01-23-1951  . Pneumonia vaccines (1 of 2 - PCV13) 02/12/2017*  . Flu Shot  09/05/2016  . Hemoglobin A1C  11/01/2016  . Mammogram  03/22/2017  . Eye exam for diabetics  03/28/2017  . Complete foot exam   04/04/2017  . Tetanus Vaccine  06/25/2018  . Cologuard (Stool DNA test)  05/30/2019  . DEXA scan (bone density measurement)  Completed  *Topic was postponed. The date shown is not the original due date.    Stress and Stress Management Stress is a normal reaction to life events. It is what you feel when life demands more than you are used to or more than you can handle. Some stress can be useful. For example, the stress reaction can help you catch the last bus of the day, study for a test, or meet a deadline at work. But stress that occurs too often or for too long can cause problems. It can affect your emotional health and interfere with  relationships and normal daily activities. Too much stress can weaken your immune system and increase your risk for physical illness. If you already have a medical problem, stress can make it worse. What are the causes? All sorts of life events may cause stress. An event that causes stress for one person may not be stressful for another person. Major life events commonly cause stress. These may be positive or negative. Examples include losing your job, moving into a new home, getting married, having a baby, or losing a loved one. Less obvious life events may also cause stress, especially if they occur day after day or in combination. Examples include working long hours, driving in traffic, caring for children, being in debt, or being in a difficult relationship. What are the signs or symptoms? Stress may cause emotional symptoms including, the following:  Anxiety. This is feeling worried, afraid, on edge, overwhelmed, or out of control.  Anger. This is feeling irritated or impatient.  Depression. This is feeling sad, down, helpless, or guilty.  Difficulty focusing, remembering, or making decisions. Stress may cause physical symptoms, including the following:  Aches and pains. These may affect your head, neck, back, stomach, or other areas of your body.  Tight muscles or  clenched jaw.  Low energy or trouble sleeping. Stress may cause unhealthy behaviors, including the following:  Eating to feel better (overeating) or skipping meals.  Sleeping too little, too much, or both.  Working too much or putting off tasks (procrastination).  Smoking, drinking alcohol, or using drugs to feel better. How is this diagnosed? Stress is diagnosed through an assessment by your health care provider. Your health care provider will ask questions about your symptoms and any stressful life events.Your health care provider will also ask about your medical history and may order blood tests or other tests.  Certain medical conditions and medicine can cause physical symptoms similar to stress. Mental illness can cause emotional symptoms and unhealthy behaviors similar to stress. Your health care provider may refer you to a mental health professional for further evaluation. How is this treated? Stress management is the recommended treatment for stress.The goals of stress management are reducing stressful life events and coping with stress in healthy ways. Techniques for reducing stressful life events include the following:  Stress identification. Self-monitor for stress and identify what causes stress for you. These skills may help you to avoid some stressful events.  Time management. Set your priorities, keep a calendar of events, and learn to say "no." These tools can help you avoid making too many commitments. Techniques for coping with stress include the following:  Rethinking the problem. Try to think realistically about stressful events rather than ignoring them or overreacting. Try to find the positives in a stressful situation rather than focusing on the negatives.  Exercise. Physical exercise can release both physical and emotional tension. The key is to find a form of exercise you enjoy and do it regularly.  Relaxation techniques. These relax the body and mind. Examples include yoga, meditation, tai chi, biofeedback, deep breathing, progressive muscle relaxation, listening to music, being out in nature, journaling, and other hobbies. Again, the key is to find one or more that you enjoy and can do regularly.  Healthy lifestyle. Eat a balanced diet, get plenty of sleep, and do not smoke. Avoid using alcohol or drugs to relax.  Strong support network. Spend time with family, friends, or other people you enjoy being around.Express your feelings and talk things over with someone you trust. Counseling or talktherapy with a mental health professional may be helpful if you are having difficulty  managing stress on your own. Medicine is typically not recommended for the treatment of stress.Talk to your health care provider if you think you need medicine for symptoms of stress. Follow these instructions at home:  Keep all follow-up visits as directed by your health care provider.  Take all medicines as directed by your health care provider. Contact a health care provider if:  Your symptoms get worse or you start having new symptoms.  You feel overwhelmed by your problems and can no longer manage them on your own. Get help right away if:  You feel like hurting yourself or someone else. This information is not intended to replace advice given to you by your health care provider. Make sure you discuss any questions you have with your health care provider. Document Released: 07/18/2000 Document Revised: 06/30/2015 Document Reviewed: 09/16/2012 Elsevier Interactive Patient Education  2017 [place]. Insomnia Insomnia is a sleep disorder that makes it difficult to fall asleep or to stay asleep. Insomnia can cause tiredness (fatigue), low energy, difficulty concentrating, mood swings, and poor performance at work or school. There are three different ways to classify insomnia:  Difficulty falling asleep.  Difficulty staying asleep.  Waking up too early in the morning. Any type of insomnia can be long-term (chronic) or short-term (acute). Both are common. Short-term insomnia usually lasts for three months or less. Chronic insomnia occurs at least three times a week for longer than three months. What are the causes? Insomnia may be caused by another condition, situation, or substance, such as:  Anxiety.  Certain medicines.  Gastroesophageal reflux disease (GERD) or other gastrointestinal conditions.  Asthma or other breathing conditions.  Restless legs syndrome, sleep apnea, or other sleep disorders.  Chronic pain.  Menopause. This may include hot  flashes.  Stroke.  Abuse of alcohol, tobacco, or illegal drugs.  Depression.  Caffeine.  Neurological disorders, such as Alzheimer disease.  An overactive thyroid (hyperthyroidism). The cause of insomnia may not be known. What increases the risk? Risk factors for insomnia include:  Gender. Women are more commonly affected than men.  Age. Insomnia is more common as you get older.  Stress. This may involve your professional or personal life.  Income. Insomnia is more common in people with lower income.  Lack of exercise.  Irregular work schedule or night shifts.  Traveling between different time zones. What are the signs or symptoms? If you have insomnia, trouble falling asleep or trouble staying asleep is the main symptom. This may lead to other symptoms, such as:  Feeling fatigued.  Feeling nervous about going to sleep.  Not feeling rested in the morning.  Having trouble concentrating.  Feeling irritable, anxious, or depressed. How is this treated? Treatment for insomnia depends on the cause. If your insomnia is caused by an underlying condition, treatment will focus on addressing the condition. Treatment may also include:  Medicines to help you sleep.  Counseling or therapy.  Lifestyle adjustments. Follow these instructions at home:  Take medicines only as directed by your health care provider.  Keep regular sleeping and waking hours. Avoid naps.  Keep a sleep diary to help you and your health care provider figure out what could be causing your insomnia. Include:  When you sleep.  When you wake up during the night.  How well you sleep.  How rested you feel the next day.  Any side effects of medicines you are taking.  What you eat and drink.  Make your bedroom a comfortable place where it is easy to fall asleep:  Put up shades or special blackout curtains to block light from outside.  Use a white noise machine to block noise.  Keep the  temperature cool.  Exercise regularly as directed by your health care provider. Avoid exercising right before bedtime.  Use relaxation techniques to manage stress. Ask your health care provider to suggest some techniques that may work well for you. These may include:  Breathing exercises.  Routines to release muscle tension.  Visualizing peaceful scenes.  Cut back on alcohol, caffeinated beverages, and cigarettes, especially close to bedtime. These can disrupt your sleep.  Do not overeat or eat spicy foods right before bedtime. This can lead to digestive discomfort that can make it hard for you to sleep.  Limit screen use before bedtime. This includes:  Watching TV.  Using your smartphone, tablet, and computer.  Stick to a routine. This can help you fall asleep faster. Try to do a quiet activity, brush your teeth, and go to bed at the same time each night.  Get out of bed if you are still awake after 15 minutes of trying to  sleep. Keep the lights down, but try reading or doing a quiet activity. When you feel sleepy, go back to bed.  Make sure that you drive carefully. Avoid driving if you feel very sleepy.  Keep all follow-up appointments as directed by your health care provider. This is important. Contact a health care provider if:  You are tired throughout the day or have trouble in your daily routine due to sleepiness.  You continue to have sleep problems or your sleep problems get worse. Get help right away if:  You have serious thoughts about hurting yourself or someone else. This information is not intended to replace advice given to you by your health care provider. Make sure you discuss any questions you have with your health care provider. Document Released: 01/20/2000 Document Revised: 06/24/2015 Document Reviewed: 10/23/2013 Elsevier Interactive Patient Education  2017 [place] and Cholesterol Restricted Diet Getting too much fat and cholesterol in your  diet may cause health problems. Following this diet helps keep your fat and cholesterol at normal levels. This can keep you from getting sick. What types of fat should I choose?  Choose monosaturated and polyunsaturated fats. These are found in foods such as olive oil, canola oil, flaxseeds, walnuts, almonds, and seeds.  Eat more omega-3 fats. Good choices include salmon, mackerel, sardines, tuna, flaxseed oil, and ground flaxseeds.  Limit saturated fats. These are in animal products such as meats, butter, and cream. They can also be in plant products such as palm oil, palm kernel oil, and coconut oil.  Avoid foods with partially hydrogenated oils in them. These contain trans fats. Examples of foods that have trans fats are stick margarine, some tub margarines, cookies, crackers, and other baked goods. What general guidelines do I need to follow?  Check food labels. Look for the words "trans fat" and "saturated fat."  When preparing a meal:  Fill half of your plate with vegetables and green salads.  Fill one fourth of your plate with whole grains. Look for the word "whole" as the first word in the ingredient list.  Fill one fourth of your plate with lean protein foods.  Eat more foods that have fiber, like apples, carrots, beans, peas, and barley.  Eat more home-cooked foods. Eat less at restaurants and buffets.  Limit or avoid alcohol.  Limit foods high in starch and sugar.  Limit fried foods.  Cook foods without frying them. Baking, boiling, grilling, and broiling are all great options.  Lose weight if you are overweight. Losing even a small amount of weight can help your overall health. It can also help prevent diseases such as diabetes and heart disease. What foods can I eat? Grains  Whole grains, such as whole wheat or whole grain breads, crackers, cereals, and pasta. Unsweetened oatmeal, bulgur, barley, quinoa, or brown rice. Corn or whole wheat flour tortillas. Vegetables   Fresh or frozen vegetables (raw, steamed, roasted, or grilled). Green salads. Fruits  All fresh, canned (in natural juice), or frozen fruits. Meat and Other Protein Products  Ground beef (85% or leaner), grass-fed beef, or beef trimmed of fat. Skinless chicken or [place]. Ground chicken or [place]. Pork trimmed of fat. All fish and seafood. Eggs. Dried beans, peas, or lentils. Unsalted nuts or seeds. Unsalted canned or dry beans. Dairy  Low-fat dairy products, such as skim or 1% milk, 2% or reduced-fat cheeses, low-fat ricotta or cottage cheese, or plain low-fat yogurt. Fats and Oils  Tub margarines without trans fats. Light or  reduced-fat mayonnaise and salad dressings. Avocado. Olive, canola, sesame, or safflower oils. Natural peanut or almond butter (choose ones without added sugar and oil). The items listed above may not be a complete list of recommended foods or beverages. Contact your dietitian for more options.  What foods are not recommended? Grains  White bread. White pasta. White rice. Cornbread. Bagels, pastries, and croissants. Crackers that contain trans fat. Vegetables  White potatoes. Corn. Creamed or fried vegetables. Vegetables in a cheese sauce. Fruits  Dried fruits. Canned fruit in light or heavy syrup. Fruit juice. Meat and Other Protein Products  Fatty cuts of meat. Ribs, chicken wings, bacon, sausage, bologna, salami, chitterlings, fatback, hot dogs, bratwurst, and packaged luncheon meats. Liver and organ meats. Dairy  Whole or 2% milk, cream, half-and-half, and cream cheese. Whole milk cheeses. Whole-fat or sweetened yogurt. Full-fat cheeses. Nondairy creamers and whipped toppings. Processed cheese, cheese spreads, or cheese curds. Sweets and Desserts  Corn syrup, sugars, honey, and molasses. Candy. Jam and jelly. Syrup. Sweetened cereals. Cookies, pies, cakes, donuts, muffins, and ice cream. Fats and Oils  Butter, stick margarine, lard, shortening, ghee, or bacon fat.  Coconut, palm kernel, or palm oils. Beverages  Alcohol. Sweetened drinks (such as sodas, lemonade, and fruit drinks or punches). The items listed above may not be a complete list of foods and beverages to avoid. Contact your dietitian for more information.  This information is not intended to replace advice given to you by your health care provider. Make sure you discuss any questions you have with your health care provider. Document Released: 07/24/2011 Document Revised: 09/29/2015 Document Reviewed: 04/23/2013 Elsevier Interactive Patient Education  2017 [place]. Diabetes Mellitus and Food It is important for you to manage your blood sugar (glucose) level. Your blood glucose level can be greatly affected by what you eat. Eating healthier foods in the appropriate amounts throughout the day at about the same time each day will help you control your blood glucose level. It can also help slow or prevent worsening of your diabetes mellitus. Healthy eating may even help you improve the level of your blood pressure and reach or maintain a healthy weight. General recommendations for healthful eating and cooking habits include:  Eating meals and snacks regularly. Avoid going long periods of time without eating to lose weight.  Eating a diet that consists mainly of plant-based foods, such as fruits, vegetables, nuts, legumes, and whole grains.  Using low-heat cooking methods, such as baking, instead of high-heat cooking methods, such as deep frying. Work with your dietitian to make sure you understand how to use the Nutrition Facts information on food labels. How can food affect me? Carbohydrates  Carbohydrates affect your blood glucose level more than any other type of food. Your dietitian will help you determine how many carbohydrates to eat at each meal and teach you how to count carbohydrates. Counting carbohydrates is important to keep your blood glucose at a healthy level, especially if you  are using insulin or taking certain medicines for diabetes mellitus. Alcohol  Alcohol can cause sudden decreases in blood glucose (hypoglycemia), especially if you use insulin or take certain medicines for diabetes mellitus. Hypoglycemia can be a life-threatening condition. Symptoms of hypoglycemia (sleepiness, dizziness, and disorientation) are similar to symptoms of having too much alcohol. If your health care provider has given you approval to drink alcohol, do so in moderation and use the following guidelines:  Women should not have more than one drink per day, and men should  not have more than two drinks per day. One drink is equal to:  12 oz of beer.  5 oz of wine.  1 oz of hard liquor.  Do not drink on an empty stomach.  Keep yourself hydrated. Have water, diet soda, or unsweetened iced tea.  Regular soda, juice, and other mixers might contain a lot of carbohydrates and should be counted. What foods are not recommended? As you make food choices, it is important to remember that all foods are not the same. Some foods have fewer nutrients per serving than other foods, even though they might have the same number of calories or carbohydrates. It is difficult to get your body what it needs when you eat foods with fewer nutrients. Examples of foods that you should avoid that are high in calories and carbohydrates but low in nutrients include:  Trans fats (most processed foods list trans fats on the Nutrition Facts label).  Regular soda.  Juice.  Candy.  Sweets, such as cake, pie, doughnuts, and cookies.  Fried foods. What foods can I eat? Eat nutrient-rich foods, which will nourish your body and keep you healthy. The food you should eat also will depend on several factors, including:  The calories you need.  The medicines you take.  Your weight.  Your blood glucose level.  Your blood pressure level.  Your cholesterol level. You should eat a variety of foods,  including:  Protein.  Lean cuts of meat.  Proteins low in saturated fats, such as fish, egg whites, and beans. Avoid processed meats.  Fruits and vegetables.  Fruits and vegetables that may help control blood glucose levels, such as apples, mangoes, and yams.  Dairy products.  Choose fat-free or low-fat dairy products, such as milk, yogurt, and cheese.  Grains, bread, pasta, and rice.  Choose whole grain products, such as multigrain bread, whole oats, and brown rice. These foods may help control blood pressure.  Fats.  Foods containing healthful fats, such as nuts, avocado, olive oil, canola oil, and fish. Does everyone with diabetes mellitus have the same meal plan? Because every person with diabetes mellitus is different, there is not one meal plan that works for everyone. It is very important that you meet with a dietitian who will help you create a meal plan that is just right for you. This information is not intended to replace advice given to you by your health care provider. Make sure you discuss any questions you have with your health care provider. Document Released: 10/19/2004 Document Revised: 06/30/2015 Document Reviewed: 12/19/2012 Elsevier Interactive Patient Education  2017 [place] DASH stands for "Dietary Approaches to Stop Hypertension." The DASH eating plan is a healthy eating plan that has been shown to reduce high blood pressure (hypertension). It may also reduce your risk for type 2 diabetes, heart disease, and stroke. The DASH eating plan may also help with weight loss. What are tips for following this plan? General guidelines   Avoid eating more than 2,300 mg (milligrams) of salt (sodium) a day. If you have hypertension, you may need to reduce your sodium intake to 1,500 mg a day.  Limit alcohol intake to no more than 1 drink a day for nonpregnant women and 2 drinks a day for men. One drink equals 12 oz of beer, 5 oz of wine, or 1  oz of hard liquor.  Work with your health care provider to maintain a healthy body weight or to lose weight. Ask what  an ideal weight is for you.  Get at least 30 minutes of exercise that causes your heart to beat faster (aerobic exercise) most days of the week. Activities may include walking, swimming, or biking.  Work with your health care provider or diet and nutrition specialist (dietitian) to adjust your eating plan to your individual calorie needs. Reading food labels   Check food labels for the amount of sodium per serving. Choose foods with less than 5 percent of the Daily Value of sodium. Generally, foods with less than 300 mg of sodium per serving fit into this eating plan.  To find whole grains, look for the word "whole" as the first word in the ingredient list. Shopping   Buy products labeled as "low-sodium" or "no salt added."  Buy fresh foods. Avoid canned foods and premade or frozen meals. Cooking   Avoid adding salt when cooking. Use salt-free seasonings or herbs instead of table salt or sea salt. Check with your health care provider or pharmacist before using salt substitutes.  Do not fry foods. Cook foods using healthy methods such as baking, boiling, grilling, and broiling instead.  Cook with heart-healthy oils, such as olive, canola, soybean, or sunflower oil. Meal planning    Eat a balanced diet that includes:  5 or more servings of fruits and vegetables each day. At each meal, try to fill half of your plate with fruits and vegetables.  Up to 6-8 servings of whole grains each day.  Less than 6 oz of lean meat, poultry, or fish each day. A 3-oz serving of meat is about the same size as a deck of cards. One egg equals 1 oz.  2 servings of low-fat dairy each day.  A serving of nuts, seeds, or beans 5 times each week.  Heart-healthy fats. Healthy fats called Omega-3 fatty acids are found in foods such as flaxseeds and coldwater fish, like sardines, salmon, and  mackerel.  Limit how much you eat of the following:  Canned or prepackaged foods.  Food that is high in trans fat, such as fried foods.  Food that is high in saturated fat, such as fatty meat.  Sweets, desserts, sugary drinks, and other foods with added sugar.  Full-fat dairy products.  Do not salt foods before eating.  Try to eat at least 2 vegetarian meals each week.  Eat more home-cooked food and less restaurant, buffet, and fast food.  When eating at a restaurant, ask that your food be prepared with less salt or no salt, if possible. What foods are recommended? The items listed may not be a complete list. Talk with your dietitian about what dietary choices are best for you. Grains  Whole-grain or whole-wheat bread. Whole-grain or whole-wheat pasta. Brown rice. [place]. Bulgur. Whole-grain and low-sodium cereals. Pita bread. Low-fat, low-sodium crackers. Whole-wheat flour tortillas. Vegetables  Fresh or frozen vegetables (raw, steamed, roasted, or grilled). Low-sodium or reduced-sodium tomato and vegetable juice. Low-sodium or reduced-sodium tomato sauce and tomato paste. Low-sodium or reduced-sodium canned vegetables. Fruits  All fresh, dried, or frozen fruit. Canned fruit in natural juice (without added sugar). Meat and other protein foods  Skinless chicken or [place]. Ground chicken or [place]. Pork with fat trimmed off. Fish and seafood. Egg whites. Dried beans, peas, or lentils. Unsalted nuts, nut butters, and seeds. Unsalted canned beans. Lean cuts of beef with fat trimmed off. Low-sodium, lean deli meat. Dairy  Low-fat (1%) or fat-free (skim) milk. Fat-free, low-fat, or reduced-fat cheeses. Nonfat, low-sodium ricotta or  cottage cheese. Low-fat or nonfat yogurt. Low-fat, low-sodium cheese. Fats and oils  Soft margarine without trans fats. Vegetable oil. Low-fat, reduced-fat, or light mayonnaise and salad dressings (reduced-sodium). Canola, safflower, olive, soybean,  and sunflower oils. Avocado. Seasoning and other foods  Herbs. Spices. Seasoning mixes without salt. Unsalted popcorn and pretzels. Fat-free sweets. What foods are not recommended? The items listed may not be a complete list. Talk with your dietitian about what dietary choices are best for you. Grains  Baked goods made with fat, such as croissants, muffins, or some breads. Dry pasta or rice meal packs. Vegetables  Creamed or fried vegetables. Vegetables in a cheese sauce. Regular canned vegetables (not low-sodium or reduced-sodium). Regular canned tomato sauce and paste (not low-sodium or reduced-sodium). Regular tomato and vegetable juice (not low-sodium or reduced-sodium). Angie Fava. Olives. Fruits  Canned fruit in a light or heavy syrup. Fried fruit. Fruit in cream or butter sauce. Meat and other protein foods  Fatty cuts of meat. Ribs. Fried meat. Berniece Salines. Sausage. Bologna and other processed lunch meats. Salami. Fatback. Hotdogs. Bratwurst. Salted nuts and seeds. Canned beans with added salt. Canned or smoked fish. Whole eggs or egg yolks. Chicken or [place] with skin. Dairy  Whole or 2% milk, cream, and half-and-half. Whole or full-fat cream cheese. Whole-fat or sweetened yogurt. Full-fat cheese. Nondairy creamers. Whipped toppings. Processed cheese and cheese spreads. Fats and oils  Butter. Stick margarine. Lard. Shortening. Ghee. Bacon fat. Tropical oils, such as coconut, palm kernel, or palm oil. Seasoning and other foods  Salted popcorn and pretzels. Onion salt, garlic salt, seasoned salt, table salt, and sea salt. Worcestershire sauce. Tartar sauce. Barbecue sauce. Teriyaki sauce. Soy sauce, including reduced-sodium. Steak sauce. Canned and packaged gravies. Fish sauce. Oyster sauce. Cocktail sauce. Horseradish that you find on the shelf. Ketchup. Mustard. Meat flavorings and tenderizers. Bouillon cubes. Hot sauce and Tabasco sauce. Premade or packaged marinades. Premade or packaged taco  seasonings. Relishes. Regular salad dressings. Where to find more information:  National Heart, Lung, and [place]: https://wilson-eaton.com/  American Heart Association: www.heart.org Summary  The DASH eating plan is a healthy eating plan that has been shown to reduce high blood pressure (hypertension). It may also reduce your risk for type 2 diabetes, heart disease, and stroke.  With the DASH eating plan, you should limit salt (sodium) intake to 2,300 mg a day. If you have hypertension, you may need to reduce your sodium intake to 1,500 mg a day.  When on the DASH eating plan, aim to eat more fresh fruits and vegetables, whole grains, lean proteins, low-fat dairy, and heart-healthy fats.  Work with your health care provider or diet and nutrition specialist (dietitian) to adjust your eating plan to your individual calorie needs. This information is not intended to replace advice given to you by your health care provider. Make sure you discuss any questions you have with your health care provider. Document Released: 01/11/2011 Document Revised: 01/16/2016 Document Reviewed: 01/16/2016 Elsevier Interactive Patient Education  2017 [place] that are packaged or in containers have a Nutrition Facts panel on the side or back. This is commonly called the food label. The food label helps you make healthy food choices by providing information about serving size and the amount of calories and various nutrients in the food. You can check the food label to find out if the food contains high or low amounts of nutrients that you want to limit in your diet. You can also use the  food label to see if the food is a good source of the nutrients that you want to make sure are included in your diet. How do I read the food label?  Begin by checking the serving size and number of servings in the container. All of the nutrition information listed on the food label is based on one  serving. If you eat more than one serving, you must multiply the amounts (such as calories, grams of saturated fat, or milligrams of sodium) by the number of servings.  Check the calories. Choosing foods that are low in calories can help you manage your weight.  Look at the numbers in the % Daily Value column for each listed nutrient. This gives you an idea of how much of the daily recommended amount for that nutrient is provided in one serving of the food. A daily value of 5% or less is considered low. A daily value of 20% or more is considered high.  Check the amounts for the items you should limit in your diet. These include:  Total fat.  Saturated fat.  Trans fat.  Cholesterol.  Sodium.  Check the amounts for the items you should make sure you get enough of. These include:  Dietary fiber.  Vitamins A and C.  Calcium.  Iron. What information is provided on the food label? Serving information   Serving size.  The serving size is listed in cups or pieces. The nutrient amounts listed on the food label apply to this amount of the food.  Servings per container or package.  This shows the number of servings you can expect to get from the container or package if you follow the suggested serving size. Amount per serving   Calories.  The number of calories in one serving of the food. This information is helpful in managing weight. Low-calorie foods contain 40 calories or less. High-calorie foods contain 400 or more calories.  Calories from fat.  The number of calories that come from fat in one serving. Percent daily value  Percent daily value (shown on the label as % Daily Value) tells you what percent of the daily value for each nutrient one serving provides. The daily value is the recommended amount of the nutrient that you should get each day. For example, if 15% is listed next to dietary fiber, it means that one serving of the food will give you 15% of the recommended  amount of fiber that you should get in a day. The daily values are based on a 2,000-calorie-per-day diet. You may get more or less than 2,000 calories in your diet each day, but the % Daily Value gives you an idea of whether the food contains a high or low amount of the listed nutrient. A daily value of 5% or less is low. A daily value of 20% or more is high. Total fat  Total fat shows you the total amount of fat in one serving (listed in grams). Foods with high amounts of fat usually have higher calories and may lead to weight gain. Two of the fats that make up a portion of the total fat are included on the label:  Saturated fat.  This number is the amount of saturated fat in one serving (listed in grams). Saturated fat increases the amount of blood cholesterol and should be limited to less than 7% of total calories each day. This means that if you eat 2,000 calories each day, you should eat less than 140 calories from  saturated fat.   Cholesterol  The amount of cholesterol in one serving is listed in milligrams. Cholesterol should be limited to no more than 300 mg each day. Sodium  The amount of sodium in one serving is listed in milligrams. Most people should limit their sodium intake to 2,300 mg a day. Total carbohydrate  This number shows the amount of total carbohydrate in one serving (listed in grams). This information can help people with diabetes manage the amount of carbohydrate they eat. Two of the carbohydrates that make up a portion of the total carbohydrate are included on the label:  Dietary fiber.  The amount of dietary fiber in one serving is listed in grams. Most people should eat at least 25 g of dietary fiber each day.  Sugars.  The amount of sugar in one serving is also listed in grams. This value includes both naturally occurring sugars from fruit and milk and added sugars such as honey or table sugar. Protein  The amount of protein in one serving is listed in  grams. What other important labeling is on the food package? Ingredients  Food labels will list each ingredient in the food. The first ingredient listed is the ingredient that the food has the most of. The ingredients are listed in the order of their amount by weight from highest to lowest. Food allergen labeling  Food labels may also include a food allergen warning. Listed here are ingredients that can cause allergic reactions in some people. The potential allergens are listed behind the word "Contains" or "May contain." Examples of ingredients that may be listed are wheat, dairy, eggs, soy, and nuts. If a person knows that he or she is allergic to one of these ingredients, he or she will know to avoid that food. Where to find more information:  U.S. Food and Drug Administration: [place].ch This information is not intended to replace advice given to you by your health care provider. Make sure you discuss any questions you have with your health care provider. Document Released: 01/22/2005 Document Revised: 09/21/2015 Document Reviewed: 12/15/2012 Elsevier Interactive Patient Education  2017 [employer].

## 2016-06-25 ENCOUNTER — Telehealth: Payer: Self-pay | Admitting: Nurse Practitioner

## 2016-06-25 DIAGNOSIS — Z0279 Encounter for issue of other medical certificate: Secondary | ICD-10-CM

## 2016-06-25 NOTE — Progress Notes (Signed)
Medical screening examination/treatment/procedure(s) were performed by he Welness Coach, RN. As primary care provider I was immediately available for consulation/collaboration. I agree with above documentation. Oron Westrup, AGNP-C 

## 2016-06-25 NOTE — Telephone Encounter (Signed)
FL2 filled out, fax[facility] to [facility] [phone], sent to be charge and scan.

## 2016-06-27 ENCOUNTER — Ambulatory Visit (INDEPENDENT_AMBULATORY_CARE_PROVIDER_SITE_OTHER): Payer: 59 | Admitting: *Deleted

## 2016-06-27 DIAGNOSIS — I639 Cerebral infarction, unspecified: Secondary | ICD-10-CM | POA: Diagnosis not present

## 2016-06-28 NOTE — Progress Notes (Signed)
Carelink Summary Report / Loop Recorder 

## 2016-06-29 LAB — CUP PACEART REMOTE DEVICE CHECK
Date Time Interrogation Session: 20180524040745
Implantable Pulse Generator Implant Date: 20150708

## 2016-07-04 ENCOUNTER — Ambulatory Visit (INDEPENDENT_AMBULATORY_CARE_PROVIDER_SITE_OTHER): Payer: 59 | Admitting: Podiatry

## 2016-07-04 ENCOUNTER — Encounter: Payer: Self-pay | Admitting: Podiatry

## 2016-07-04 VITALS — BP 152/83 | HR 82 | Resp 18

## 2016-07-04 DIAGNOSIS — E1151 Type 2 diabetes mellitus with diabetic peripheral angiopathy without gangrene: Secondary | ICD-10-CM

## 2016-07-04 DIAGNOSIS — L608 Other nail disorders: Secondary | ICD-10-CM

## 2016-07-04 NOTE — Patient Instructions (Signed)

## 2016-07-04 NOTE — Progress Notes (Signed)
Patient ID: Kara HammockSheila L Mendoza, female   DOB: 06/28/1950, 66 y.o.   MRN: [id number]   Subjective: This patient presents today complaining that her toenails are becoming progressively more difficult to trim over the last 6-12 months and she is requesting trimming of the toenails. She denies any previous podiatric care. Patient is diabetic and denies history of foot ulceration, claudication or amputation Patient is current smoker and has reduced smoking from 2 packs to several cigarettes a day Patient is had a history of CVAs 2 affecting balance and speech   Objective: Orientated 3  Vascular: DP pulses 2/4 bilaterally PT pulses 1/4 bilaterally Capillary reflex within normal limits bilaterally  Neurological: Sensation to 10 g monofilament wire intact 5/5 bilaterally Vibratory sensation reactive bilaterally Ankle reflex reactive bilaterally  Dermatological: No open skin lesions bilaterally Incurvated toenails with striping along the medial lateral borders of all the toenails Plantar callus fourth right MPJ  Musculoskeletal: There is no restriction ankle, subtalar, midtarsal joints bilaterally Manual motor testing dorsi flexion, plantar flexion, inversion, eversion 5/5 bilaterally      Assessment & Plan:   Assessment: Diabetic with decreased posterior tibial pulses suggestive of peripheral arterial disease Incurvated toenails 6-10 Plantar callus right  Plan: Debridement toenails 6-10 mechanically athletic without any bleeding Debride plantar callus without any bleeding

## 2016-07-18 ENCOUNTER — Telehealth: Payer: Self-pay | Admitting: Nurse Practitioner

## 2016-07-18 NOTE — Telephone Encounter (Signed)
She has not diagnosis of COPD nor is she dependent on oxygen, hence I can not write such a letter.

## 2016-07-18 NOTE — Telephone Encounter (Signed)
Pts electricity has been turned off because she cannot pay it, she would like for us to send a letter to social services stating she has a breathing problem and her other conditions stating she needs her electricity turned back on. She cannot afford to pay her bill.  Would like it faxed to [phone] Social services office attention Evlyn CourierNelson Peterson

## 2016-07-18 NOTE — Telephone Encounter (Signed)
Please advise 

## 2016-07-19 NOTE — Telephone Encounter (Signed)
Advise pt of massage below.   Pt stated she is on the nebulizer machine to help her breath and she also have the heart monitor that they put on since 2015 from cardiologist and she has to have it plug up in order for them to monitor her heart (please check in chart).

## 2016-07-20 NOTE — Telephone Encounter (Signed)
Hand held albuterol (proventil) can be used in place of nebulizer machine. She needs to contact cardiology about cardiac monitor. Thank you

## 2016-07-20 NOTE — Telephone Encounter (Signed)
Inform pt of massage below. Pt stated she already got this taken care of.

## 2016-07-27 ENCOUNTER — Ambulatory Visit (INDEPENDENT_AMBULATORY_CARE_PROVIDER_SITE_OTHER): Payer: 59 | Admitting: *Deleted

## 2016-07-27 DIAGNOSIS — I639 Cerebral infarction, unspecified: Secondary | ICD-10-CM

## 2016-07-30 NOTE — Progress Notes (Signed)
Carelink Summary Report / Loop Recorder 

## 2016-08-03 LAB — CUP PACEART REMOTE DEVICE CHECK
Date Time Interrogation Session: 20180623044119
Implantable Pulse Generator Implant Date: 20150708

## 2016-08-23 ENCOUNTER — Encounter: Payer: Self-pay | Admitting: Family

## 2016-08-27 ENCOUNTER — Encounter: Payer: 59 | Admitting: *Deleted

## 2016-08-27 ENCOUNTER — Telehealth: Payer: Self-pay | Admitting: *Deleted

## 2016-08-27 NOTE — Telephone Encounter (Addendum)
Spoke with patient, discussed plan now that LINQ is at [facility].  Patient declines f/u appointment with Dr. Caryl Comes, states she does not wish to discuss explant.  Patient is agreeable to receiving return kit in the mail.  She is aware to call in the future if she wishes to see Dr. Caryl Comes.  Patient is appreciative and denies additional questions or concerns at this time.

## 2016-09-10 ENCOUNTER — Ambulatory Visit: Payer: 59 | Admitting: Neurology

## 2016-09-11 ENCOUNTER — Encounter: Payer: Self-pay | Admitting: Neurology

## 2016-09-11 ENCOUNTER — Ambulatory Visit (INDEPENDENT_AMBULATORY_CARE_PROVIDER_SITE_OTHER): Payer: 59 | Admitting: Neurology

## 2016-09-11 VITALS — BP 113/73 | HR 84 | Ht 62.0 in | Wt 165.0 lb

## 2016-09-11 DIAGNOSIS — E785 Hyperlipidemia, unspecified: Secondary | ICD-10-CM

## 2016-09-11 DIAGNOSIS — Z8673 Personal history of transient ischemic attack (TIA), and cerebral infarction without residual deficits: Secondary | ICD-10-CM

## 2016-09-11 DIAGNOSIS — G90513 Complex regional pain syndrome I of upper limb, bilateral: Secondary | ICD-10-CM

## 2016-09-11 DIAGNOSIS — I480 Paroxysmal atrial fibrillation: Secondary | ICD-10-CM

## 2016-09-11 DIAGNOSIS — E1159 Type 2 diabetes mellitus with other circulatory complications: Secondary | ICD-10-CM | POA: Diagnosis not present

## 2016-09-11 MED ORDER — GABAPENTIN 300 MG PO CAPS: 900.0000 mg | ORAL_CAPSULE | Freq: Three times a day (TID) | ORAL | 2 refills | Status: DC

## 2016-09-11 NOTE — Progress Notes (Signed)
STROKE NEUROLOGY FOLLOW UP NOTE  NAME: Kara Mendoza DOB: 1950/10/21  REASON FOR VISIT: stroke follow up HISTORY FROM: pt and chart  Today we had the pleasure of seeing Kara Mendoza in follow-up at our Neurology Clinic. Pt was accompanied by no one.   History Summary Kara Mendoza is a 66 y.o. female with prior hx of left MCA distribution infarct in 2008 with residual language impairment, hypertension, dyslipidemia, tobacco abuse, admitted with stuttering speech and LUE pain and fluctuating weakness. Did not receive tPA due to unclear last known well. Imaging confirms chronic left parietal infarct with new extension into the left parietal operculum, left insula, and left inferior parietal lobe, surrounding the area of chronic infarction. Chronic microvascular ischemic change. 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 Stroke etiology felt to be embolic, though no source found (cryptogenic). Hypercoagulable work up so far unrevealing and TEE neg. Loop recorder inserted. She was discharged with plavix and zocor.  Follow up 09/29/13 - the patient has been doing better with speech although she will have stuttering if she is anxious. She still complains of LUE pain, painful to touch, stated that it sometimes swelling, cold, redness, which has been going for about one year. She will have outpt neurology referral by PCP to see Dr. Caroline Sauger at [facility] for this. She has Loop recorder implanted and so far she did not receive any phone call from cardiology.    Follow up 01/06/14 - she was doing a little bit better. TCD emboli detection negative. She had follow up with Dr. Caroline Sauger for left UE RDS and was put on gabapentin and discontinued lyrica. She felt pain is better controlled, however, she stated that combination of percocet and gabapentin is more effective but she has no percocet left. She needs to see Dr. Wess Botts  for more percocet prescription. She also followed with cardiology and loop recorder so far no AF episodes. She also follows with Dr. Carlis Abbott for thyroid issue for which she is taking methimazole. Otherwise, she has no complain of neurological deficit, she still has mild stuttering which is chronic. Pt continued smoking, although cutting down from 2PPD to 3 cig per day.  07/08/14 follow up - she still has pain on the left arm due to RSD. She followed up with Dr. Caroline Sauger in [facility] for RDS but she is currently seeing orthopedics in [facility] for left arm pain, concerning for RSD or CRPS in another term. She is taking Cymbalta, gabapentin and Lyrica for the pain. She has not been taking Plavix, because she was told that Plavix can cause stroke in her. No recurrent strokelike symptoms, blood pressure today 117/82.  12/23/14 follow up - she still has pain on the left arm due to RSD. She has been seen by Dr. Caroline Sauger in [facility] and also [facility] orthopedics for this. As per pt, she was recommended to follow up with [facility] neurology for some new procedures for treatment of RSD. She has not seen [facility] neurology yet. She continued to experience pain in the left arm, swelling, hot feeling, up to the upper arm. Lidocaine cream helped but she ran out of it. I encouraged her to continue follow up with [facility] neurology for RSD and will refill her lidocaine cream.   06/05/16 follow up - pt has been doing well from stroke standpoint. She went to [facility] supposedly for her RDS but was seen for stroke follow  up. She decided to [facility] and would like to stick with local [place] neurologist for her RDS. She continued to have left arm pain to touch, swelling in hands, involving entire arm. Using lidocaine cream helps, also on gabapentin and cymbalta, but percocet was discontinued from her PCP. Recently, she started to have similar pain in right arm and hand, but mild, with intermittent right hand swelling. She was afraid that  her right arm will get the same thing. She denies any stress, anxiety but admitted that she has some issues with her landlord for her apartment. BP today 135/88.   Interval History During the interval time, pt has been doing the same. LUE continues to have pain to touch, burning in the left thumb, the feeling seemed to spread to her RUE and right thumb. She is on 600mg  gabapentin tid and 60mg  cymbalta, effective, but short duration and not able to cure it. LDL still high but A1C in good control. BP today 113/73.   REVIEW OF SYSTEMS: Full 14 system review of systems performed and notable only for those listed below and in HPI above, all others are negative:  Constitutional: fatigue  Cardiovascular: palpitation  Ear/Nose/Throat: Ringing in ears, hearing loss Skin: Moles, itching Eyes: double vision, eye discharge  Respiratory: N/A  Gastroitestinal:   Genitourinary: Incontinence of bladder, frequency of urination Hematology/Lymphatic: Bruce/bleed easily Endocrine: heat intolerance  Musculoskeletal: neck stiffness, walking difficulty, joint pain Allergy/Immunology: N/A  Neurological: Numbness, headache, speech difficulty, weakness  Psychiatric: restless leg, depression, nervous/anxious  The following represents the patient's updated allergies and side effects list: Allergies  Allergen Reactions  . Aspirin Hives, Itching and Other (See Comments)  . Diclofenac Shortness Of Breath    Only with po diclofenac  . Penicillins Hives    Has patient had a PCN reaction causing immediate rash, facial/tongue/throat swelling, SOB or lightheadedness with hypotension: Yes Has patient had a PCN reaction causing severe rash involving mucus membranes or skin necrosis: No Has patient had a PCN reaction that required hospitalization No Has patient had a PCN reaction occurring within the last 10 years: No If all of the above answers are "NO", then may proceed with Cephalosporin use.     Labs since last  visit of relevance include the following: Results for orders placed or performed in visit on 07/27/16  CUP PACEART REMOTE DEVICE CHECK  Result Value Ref Range   Date Time Interrogation Session [id number]    Pulse Generator Manufacturer MERM    Pulse Gen Model [id number] Reveal LINQ    Pulse Gen Serial Number [id number]    Clinic Name [facility]    Implantable Pulse Generator Type ICM/ILR    Implantable Pulse Generator Implant Date [id number]    Eval Rhythm SR     The neurologically relevant items on the patient's problem list were reviewed on today's visit.  Neurologic Examination  A problem focused neurological exam (12 or more points of the single system neurologic examination, vital signs counts as 1 point, cranial nerves count for 8 points) was performed.  Blood pressure 113/73, pulse 84, height 5\' 2"  (1.575 m), weight 165 lb (74.8 kg).  General - Well nourished, well developed, in no apparent distress.  Ophthalmologic - not able to see through.  Cardiovascular - Regular rate and rhythm with no murmur.  Mental Status -  Level of arousal and orientation to time, place, and person were intact. Language including expression, naming, repetition, comprehension was assessed and found intact.,  but with stuttering if anxious  Cranial Nerves II - XII - II - Visual field intact OU. III, IV, VI - Extraocular movements intact. V - Facial sensation intact bilaterally. VII - Facial movement intact bilaterally. VIII - Hearing & vestibular intact bilaterally. X - Palate elevates symmetrically, mild stuttering. XI - Chin turning & shoulder shrug intact bilaterally. XII - Tongue protrusion intact.  Motor Strength - The patient's strength was normal in all extremities and pronator drift was absent.  Bulk was normal and fasciculations were absent.  Motor Tone - Muscle tone was assessed at the neck and appendages and was normal.  Reflexes - The patient's reflexes were 1+ in all  extremities and she had no pathological reflexes.  Sensory - sensitive and painful on touch at left entire arm, mild painful on touch at right thumb.  Coordination - The patient had normal movements in the hands with no ataxia or dysmetria.  Tremor was absent.  Gait and Station - normal gait.  Data reviewed: I personally reviewed the images and agree with the radiology interpretations.  Dg Chest 2 View  08/09/2013 IMPRESSION: No active cardiopulmonary disease.  Ct Head Wo Contrast  08/09/2013 IMPRESSION: Stable chronic left MCA distribution infarction. No acute intracranial abnormality.  Ct Angio Head and neck W/cm &/or Wo Cm  08/09/2013 IMPRESSION: Negative study. No evidence of atherosclerotic narrowing or dissection. No vessel occlusion. No aneurysm or vascular malformation.  MRI/MRA  08/10/2013 IMPRESSION: Chronic left parietal infarct. Chronic microvascular ischemic change. Extension of acute infarct into the left parietal operculum, left insula, and left inferior parietal lobe, surrounding the area of chronic infarction. Small irregular distal left vertebral artery. 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  Carotid Doppler CTA neck done  2D Echocardiogram 08/10/13 EF 55-60%, no cardiac source of emboli identified  LUE doppler There is no DVT or SVT noted in the left upper extremity.  Transesophageal Echocardiogram 08/12/13 Impression:  1) No source of embolus  2) Negative bubble study  3) Normal EF 60%  4) No significant valve disease  5) No LAA thrombus  6) No aortic debris  7) Normal RV  8) No effusion  EKG 08/09/13 Sinus rhythm  RAE, consider biatrial enlargement  Anteroseptal infarct, old   Loop recorder - no afib  Component     Latest Ref Rng 08/11/2013  Anticardiolipin IgA     <22 APL U/mL 3 (L)  Anticardiolipin IgG     <23 GPL U/mL 2 (L)  Anticardiolipin IgM     <11 MPL U/mL 3 (L)  PTT Lupus  Anticoagulant     28.0 - 43.0 secs 33.0  PTTLA Confirmation     <8.0 secs NOT APPL  PTTLA 4:1 Mix     28.0 - 43.0 secs NOT APPL  DRVVT     <42.9 secs 32.5  Drvvt confirmation     <1.15 Ratio NOT APPL  dRVVT Incubated 1:1 Mix     <42.9 secs NOT APPL  Lupus Anticoagulant     NOT DETECTED NOT DETECTED  Phosphatydalserine, IgG     <16 U/mL 4  Phosphatydalserine, IgM     <22 U/mL 7  Phosphatydalserine, IgA     <20 U/mL 5  Total Protein     6.0 - 8.3 g/dL 6.6  Albumin     3.5 - 5.2 g/dL 3.7  AST     0 - 37 U/L 14  ALT  0 - 35 U/L 15  Alkaline Phosphatase     39 - 117 U/L 59  Total Bilirubin     0.3 - 1.2 mg/dL 0.6  Bilirubin, Direct     0.0 - 0.3 mg/dL <4.0  Indirect Bilirubin     0.3 - 0.9 mg/dL NOT CALCULATED  c-ANCA Screen     NEGATIVE NEGATIVE  p-ANCA Screen     NEGATIVE NEGATIVE  Atypical p-ANCA Screen     NEGATIVE NEGATIVE  TSH     0.350 - 4.500 uIU/mL   Vitamin B-12     211 - 911 pg/mL 1016 (H)  Folate      >20.0  RPR     NON REAC NON REAC  HIV     NONREACTIVE NONREACTIVE  Sed Rate     0 - 22 mm/hr 3  CRP, High Sensitivity      1.3  C3 Complement     90 - 180 mg/dL 981  Complement C4, Body Fluid     10 - 40 mg/dL 28  ds DNA Ab      <1  Homocysteine     4.0 - 15.4 umol/L 6.5  SSA (Ro) (ENA) Antibody, IgG     <1.0 NEG AI <1.0 NEG  SSB (La) (ENA) Antibody, IgG     <1.0 NEG AI <1.0 NEG  Rheumatoid Factor     <=14 IU/mL <10    Assessment: As you may recall, she is a 66 y.o. African American female with PMH of left MCA distribution infarct in 2008 with residual language impairment, hypertension, dyslipidemia, tobacco abuse, found to have again left parietal and frontal small embolic pattern strokes in 08/2013, appears like extension from previous 2008 stroke. Felt like embolic, work up negative and hypercoag labs were not remarkable. TCD emboli monitoring negative for MES. Loop recorder placed and found AF episode in 07/2015. She was put on Xarelto  by cardiology but pt later did not want to continue. She was then put back on plavix.   She does have left arm RSD or CRPS with developing mild RSD at right thumb, and she has decided to follow-up here instead of [facility] or [facility]. Her gabapentin increased to 600 mg 3 times a day and Cymbalta to 60 mg. However, pt continues to have similar symptoms. Discussed with pt about further options, will increase gabapentin to 900mg  tid, continue cymbalta and will refer to pain management. In the meantime, encourage pt exercising positive feedback and keep active.    Plan:  - continue plavix and statin for stroke prevention - check BP and glucose at home - Follow up with your primary care physician for stroke risk factor modification. Recommend maintain blood pressure goal <130/80, diabetes with hemoglobin A1c goal below 6.5% and lipids with LDL cholesterol goal below 70 mg/dL.  - continue cymbalta to 60mg  daily - increase gabapentin to 900mg  three times a day.  - will refer to pain management - quit smoking completely. - positive feedback to self to increase pain threshold. Recommend more activities, such as volunteer work, new relationship, etc.   - follow up 3 months.  I spent more than 25 minutes of face to face time with the patient. Greater than 50% of time was spent in counseling and coordination of care. We have discussed about the etiology, treatment option and prognosis of RSD or CRPS.    Orders Placed This Encounter  Procedures  . Ambulatory referral to Pain Clinic    Referral Priority:  Routine    Referral Type:   Consultation    Referral Reason:   Specialty Services Required    Requested Specialty:   Pain Medicine    Number of Visits Requested:   1   Meds ordered this encounter  Medications  . DISCONTD: gabapentin (NEURONTIN) 600 MG tablet    Sig: Take 600 mg by mouth.  . DISCONTD: DULoxetine (CYMBALTA) 30 MG capsule    Refill:  1  . gabapentin (NEURONTIN) 300 MG capsule    Sig:  Take 3 capsules (900 mg total) by mouth 3 (three) times daily.    Dispense:  180 capsule    Refill:  2    Patient Instructions  - continue plavix and statin for stroke prevention - check BP and glucose at home - Follow up with your primary care physician for stroke risk factor modification. Recommend maintain blood pressure goal <130/80, diabetes with hemoglobin A1c goal below 6.5% and lipids with LDL cholesterol goal below 70 mg/dL.  - continue cymbalta to 60mg  daily - increase gabapentin to 900mg  three times a day.  - will refer to pain management - quit smoking completely. - positive feedback to self to increase pain threshold. Recommend more activities, such as volunteer work, new relationship, etc.   - follow up 3 months.   [name], MD PhD [facility] [address], [address] [place], [place] [address] [phone]

## 2016-09-11 NOTE — Patient Instructions (Signed)
-   continue plavix and statin for stroke prevention - check BP and glucose at home - Follow up with your primary care physician for stroke risk factor modification. Recommend maintain blood pressure goal <130/80, diabetes with hemoglobin A1c goal below 6.5% and lipids with LDL cholesterol goal below 70 mg/dL.  - continue cymbalta to 60mg  daily - increase gabapentin to 900mg  three times a day.  - will refer to pain management - quit smoking completely. - positive feedback to self to increase pain threshold. Recommend more activities, such as volunteer work, new relationship, etc.   - follow up 3 months.

## 2016-09-12 ENCOUNTER — Telehealth: Payer: Self-pay | Admitting: Neurology

## 2016-09-12 NOTE — Telephone Encounter (Signed)
Patient is already an established patient with Dr. Baxter HireKristen with [facility]. Patient has a follow up apt [address]pt [address] , 2018.

## 2016-09-21 ENCOUNTER — Encounter: Payer: Self-pay | Admitting: Internal Medicine

## 2016-09-21 NOTE — Progress Notes (Signed)
Faxed received from [facility]- Dr. Ursula Beath. Form signed by Dr. Graciela Husbands and faxed back to [phone]. Confirmation received.

## 2016-09-27 ENCOUNTER — Telehealth: Payer: Self-pay | Admitting: Nurse Practitioner

## 2016-09-27 NOTE — Telephone Encounter (Signed)
Bridge from [facility] called asking if we received physician order forms for a wrist and knee brace? They will be sending them again today. She would like a call to let her know if we have received it and when it will be completed ([phone])

## 2016-09-27 NOTE — Telephone Encounter (Signed)
Pt is aware to contact her orthopedic doctor to fill out this request.

## 2016-09-27 NOTE — Telephone Encounter (Signed)
Just got the knee one but waiting for the paper work for wrist. Waiting for [place] to [facility] this.

## 2016-10-01 ENCOUNTER — Encounter: Payer: Self-pay | Admitting: Podiatry

## 2016-10-01 ENCOUNTER — Ambulatory Visit (INDEPENDENT_AMBULATORY_CARE_PROVIDER_SITE_OTHER): Payer: 59 | Admitting: Podiatry

## 2016-10-01 DIAGNOSIS — E1151 Type 2 diabetes mellitus with diabetic peripheral angiopathy without gangrene: Secondary | ICD-10-CM

## 2016-10-01 DIAGNOSIS — L608 Other nail disorders: Secondary | ICD-10-CM | POA: Diagnosis not present

## 2016-10-01 NOTE — Patient Instructions (Signed)

## 2016-10-01 NOTE — Progress Notes (Signed)
Patient ID: Kara Mendoza, female   DOB: September 23, 1950, 66 y.o.   MRN: [id number]    Subjective: This patient presents today complaining that her toenails are becoming progressively more difficult to trim over the last 6-12 months and she is requesting trimming of the toenails. She denies any previous podiatric care. Patient is diabetic and denies history of foot ulceration, claudication or amputation Patient is current smoker and has reduced smoking from 2 packs to several cigarettes a day Patient is had a history of CVAs 2 affecting balance and speech   Objective: Orientated 3  Vascular: DP pulses 2/4 bilaterally PT pulses 1/4 bilaterally Capillary reflex within normal limits bilaterally  Neurological: Sensation to 10 g monofilament wire intact 5/5 bilaterally Vibratory sensation reactive bilaterally Ankle reflex reactive bilaterally  Dermatological: No open skin lesions bilaterally Incurvated toenails with striping along the medial lateral borders of all the toenails Plantar callus fourth right MPJ  Musculoskeletal: There is no restriction ankle, subtalar, midtarsal joints bilaterally Manual motor testing dorsi flexion, plantar flexion, inversion, eversion 5/5 bilaterally  Assessment: Diabetic with decreased posterior tibial pulses suggestive of peripheral arterial disease Incurvated toenails 6-10 Plantar callus right  Plan: Debridement toenails 6-10 mechanically athletic without anybleeding Debride plantar callus without any bleeding   Reappoint x 3 months

## 2016-10-03 ENCOUNTER — Ambulatory Visit: Payer: 59 | Admitting: Podiatry

## 2016-10-03 NOTE — Telephone Encounter (Signed)
She can consult with dr. Doroteo BradfordKirstein with physical medicine and rehabilitation about  Completion of her form

## 2016-10-03 NOTE — Telephone Encounter (Signed)
Pt called in and said that she does not have a orthopedic dr.  She said that she is tried of falling and needs the brace.

## 2016-10-03 NOTE — Telephone Encounter (Signed)
Please advise 

## 2016-10-05 NOTE — Telephone Encounter (Signed)
Pt verbalized understand.  

## 2016-10-09 ENCOUNTER — Encounter: Payer: Self-pay | Admitting: Neurology

## 2016-10-09 NOTE — Progress Notes (Signed)
Clearance for dental fax to [phone]. Clearance letter fax twice to [phone] and receive.

## 2016-10-26 ENCOUNTER — Encounter: Payer: Medicare Other | Attending: Physical Medicine & Rehabilitation

## 2016-10-26 ENCOUNTER — Ambulatory Visit: Payer: 59 | Admitting: Physical Medicine & Rehabilitation

## 2016-11-02 ENCOUNTER — Ambulatory Visit (HOSPITAL_COMMUNITY)
Admission: EM | Admit: 2016-11-02 | Discharge: 2016-11-02 | Disposition: A | Discharge status: Home / Self Care | Payer: Medicare Other | Attending: Internal Medicine | Admitting: Internal Medicine

## 2016-11-02 ENCOUNTER — Encounter (HOSPITAL_COMMUNITY): Payer: Self-pay | Admitting: Emergency Medicine

## 2016-11-02 DIAGNOSIS — W57XXXA Bitten or stung by nonvenomous insect and other nonvenomous arthropods, initial encounter: Secondary | ICD-10-CM

## 2016-11-02 DIAGNOSIS — S50861A Insect bite (nonvenomous) of right forearm, initial encounter: Secondary | ICD-10-CM | POA: Diagnosis not present

## 2016-11-02 MED ORDER — DIPHENHYDRAMINE HCL 12.5 MG/5ML PO ELIX
ORAL_SOLUTION | ORAL | Status: AC
  Filled 2016-11-02: qty 10

## 2016-11-02 MED ORDER — DIPHENHYDRAMINE HCL 12.5 MG/5ML PO ELIX: 12.5000 mg | ORAL_SOLUTION | Freq: Once | ORAL | Status: DC

## 2016-11-02 MED ORDER — HYDROXYZINE HCL 25 MG PO TABS: 25.0000 mg | ORAL_TABLET | Freq: Four times a day (QID) | ORAL | 0 refills | Status: DC

## 2016-11-02 NOTE — ED Notes (Addendum)
Pt is driving.... Notified provider, AMy, Y PA... D/c diphenhydramine

## 2016-11-02 NOTE — Discharge Instructions (Signed)
Take hydroxyzine as directed. Ice compress as needed. If experiencing worsening of symptoms, spreading redness, increase warmth, fever, drainage, follow up with PCP for further evaluation.

## 2016-11-02 NOTE — ED Provider Notes (Signed)
[facility]    CSN: [id number] Arrival date & time: 11/02/16  1603     History   Chief Complaint Chief Complaint  Patient presents with  . Insect Bite    HPI Kara Mendoza is a 66 y.o. female.   66 year old female with history of anxiety, chronic arm pain, diabetes, asthma, hypertension, hypothyroidism, major depressive order, stroke, comes in for 1 day history of right arm insect bite. Patient states bite is itchy in nature, with mild pain. Denies fever, chills, night sweats. Patient states with history of spider bite on her left finger, that required drainage. States she was told it was by a brown recluse spider, and she "could've needed her arm cut off" if she had gone in later than she did. She states she can "feel the poison traveling up my arm", and feels lightheaded with dizziness and thinks the poison is getting to her. She states while in the waiting room, she started feeling chest pain. Denies weakness, syncope, shortness of breath. She has a history of paroxysmal A. fib, stroke, which she follows with the cardiologist, and states everything has been stable. Denies palpitations. Denies current chest pain on exam.      Past Medical History:  Diagnosis Date  . Abnormality of gait   . Acute bronchitis   . Allergy   . Anxiety   . Anxiety state, unspecified   . Asthma   . Chronic arm pain 2008   left  . Chronic headache   . Chronic leg pain 2008   left  . Chronic pain syndrome   . Constipation   . Diabetes mellitus (HCC)   . Dysarthria   . Esophageal reflux   . Fatigue   . History of fall   . Hyperlipidemia   . Hypertension   . Hyperthyroidism   . LOOP Recorder 10/14/2013  . Major depressive disorder, recurrent episode, moderate (HCC)   . Memory loss   . Neuromuscular disorder (HCC)   . Osteoarthritis   . Stroke (HCC)   . Tobacco use disorder     Patient Active Problem List   Diagnosis Date Noted  . Residual cognitive deficit as late effect of  stroke 04/26/2016  . Hemiparesis affecting left side as late effect of cerebrovascular accident (CVA) (HCC) 04/26/2016  . Carpal tunnel syndrome on left 11/29/2015  . DDD (degenerative disc disease), cervical 11/29/2015  . Depression 11/29/2015  . Chronic pain of left knee 09/04/2015  . Headache 09/04/2015  . Hypercalcemia 08/06/2015  . Pain in right upper arm 08/06/2015  . Paroxysmal atrial fibrillation (HCC) 07/22/2015  . History of loop recorder 04/11/2015  . History of stroke 03/17/2015  . Shortness of breath 06/23/2014  . Complex regional pain syndrome type 2 of upper extremity 11/05/2013  . Carpal tunnel syndrome on right 10/22/2013  . Chronic embolism and thrombosis of deep vein of left upper extremity (HCC) 10/22/2013  . Left arm pain 10/22/2013  . Cardiac device in situ 10/14/2013  . Essential hypertension 09/30/2013  . Complex regional pain syndrome type 1 of both upper extremities 09/30/2013  . Diabetes mellitus (HCC) 09/28/2013  . Hyperthyroidism 09/28/2013  . Median nerve neuritis 09/28/2013  . Ataxia, late effect of cerebrovascular disease 09/28/2013  . Spastic hemiplegia affecting dominant side (HCC) 09/28/2013  . CVA (cerebral infarction) 08/15/2013  . Cerebral embolism with cerebral infarction (HCC) 08/09/2013  . Dysarthria 08/09/2013  . Abnormality of gait 05/01/2012  . Incontinence of urine 05/01/2012  . Tobacco abuse 01/28/2012  .  Tobacco dependence syndrome 01/28/2012  . Fatigue 01/01/2011  . Weakness generalized 11/03/2010  . Plantar fasciitis 09/01/2010  . Allergic rhinitis 07/11/2010  . Persistent cough 06/16/2010  . Asthma night-time symptoms 06/14/2010  . GERD (gastroesophageal reflux disease) 05/26/2010  . Stroke (HCC) 05/26/2010  . Hyperlipidemia 05/26/2010  . Osteoarthritis 05/26/2010  . Mononeuritis 05/26/2010    Past Surgical History:  Procedure Laterality Date  . CESAREAN SECTION     x3  . FINGER SURGERY    . LOOP RECORDER IMPLANT   08-12-2013   MDT LINQ implanted by Dr Graciela Husbands for cryptogenic stroke  . LOOP RECORDER IMPLANT N/A 08/12/2013   Procedure: LOOP RECORDER IMPLANT;  Surgeon: Duke Salvia, MD;  Location: [facility] CATH LAB;  Service: Cardiovascular;  Laterality: N/A;  . TEE WITHOUT CARDIOVERSION N/A 08/12/2013   Procedure: TRANSESOPHAGEAL ECHOCARDIOGRAM (TEE);  Surgeon: Wendall Stade, MD;  Location: [facility] ENDOSCOPY;  Service: Cardiovascular;  Laterality: N/A;    OB History    No data available       Home Medications    Prior to Admission medications   Medication Sig Start Date End Date Taking? Authorizing Provider  atorvastatin (LIPITOR) 40 MG tablet TAKE 1 TABLET(40 MG) BY MOUTH DAILY AT 6 PM 05/21/16  Yes Nche, Bonna Gains, NP  clopidogrel (PLAVIX) 75 MG tablet Take 1 tablet (75 mg total) by mouth daily. Patient taking differently: Take 75 mg by mouth daily. Pt reports taking a old prescription of this medications as she ran out two days ago of her current prescription. Note the prescription was out of date as 01/2015. 12/07/15  Yes Janetta Hora, PA-C  diclofenac sodium (VOLTAREN) 1 % GEL Apply 2 g topically 4 (four) times daily. 04/26/16  Yes Kirsteins, Victorino Sparrow, MD  DULoxetine (CYMBALTA) 60 MG capsule Take 1 capsule (60 mg total) by mouth daily. 06/05/16  Yes [name], MD  gabapentin (NEURONTIN) 300 MG capsule Take 3 capsules (900 mg total) by mouth 3 (three) times daily. 09/11/16  Yes [name], MD  Glucose Blood (BLOOD GLUCOSE TEST STRIPS) STRP Inject 1 Units into the skin 4 (four) times daily -  before meals and at bedtime. OneTouch Ultra mini strips Patient taking differently: Inject 1 Units into the skin 4 (four) times daily -  before meals and at bedtime. OneTouch Ultra mini strips 02/29/16  Yes Nche, Bonna Gains, NP  losartan-hydrochlorothiazide (HYZAAR) 100-25 MG tablet Take 1 tablet by mouth daily. 05/21/16  Yes Nche, Bonna Gains, NP  metFORMIN (GLUCOPHAGE) 500 MG tablet TAKE 1 TABLET(500 MG) BY MOUTH  DAILY WITH BREAKFAST 05/21/16  Yes Nche, Bonna Gains, NP  Multiple Vitamins-Calcium (ONE-A-DAY WOMENS FORMULA PO) Take 1 tablet by mouth daily.   Yes [provider]  omeprazole (PRILOSEC) 20 MG capsule TAKE 1 CAPSULE(20 MG) BY MOUTH TWICE DAILY 05/21/16  Yes Nche, Bonna Gains, NP  ONETOUCH DELICA LANCETS FINE MISC 1 Units by Does not apply route 4 (four) times daily -  before meals and at bedtime. Patient taking differently: 1 Units by Does not apply route 4 (four) times daily -  before meals and at bedtime.  02/29/16  Yes Nche, Bonna Gains, NP  zolpidem (AMBIEN) 5 MG tablet Take 1 tablet (5 mg total) by mouth at bedtime as needed for sleep. 12/10/14  Yes Carmelina Dane, MD  acetaminophen (TYLENOL) 500 MG tablet Take 2 tablets (1,000 mg total) by mouth every 6 (six) hours as needed. 11/30/15   Nche, Bonna Gains, NP  albuterol (PROVENTIL  HFA;VENTOLIN HFA) 108 (90 Base) MCG/ACT inhaler Inhale 2 puffs into the lungs every 6 (six) hours as needed for wheezing or shortness of breath. 05/21/16   Nche, Bonna Gains, NP  hydrOXYzine (ATARAX/VISTARIL) 25 MG tablet Take 1 tablet (25 mg total) by mouth every 6 (six) hours. 11/02/16   Belinda Fisher, PA-C    Family History Family History  Problem Relation Age of Onset  . Cancer Father   . Diabetes Sister   . Diabetes Brother     Social History Social History  Substance Use Topics  . Smoking status: Light Tobacco Smoker    Packs/day: 0.20    Years: 15.00    Types: Cigarettes  . Smokeless tobacco: Never Used     Comment:  smoking 3-4 a day  . Alcohol use No     Allergies   Aspirin; Diclofenac; and Penicillins   Review of Systems Review of Systems  Reason unable to perform ROS: See HPI as above.     Physical Exam Triage Vital Signs ED Triage Vitals  Enc Vitals Group     BP 11/02/16 1647 133/71     Pulse Rate 11/02/16 1647 69     Resp 11/02/16 1647 20     Temp 11/02/16 1647 98.5 F (36.9 C)     Temp Source 11/02/16  1647 Oral     SpO2 11/02/16 1647 100 %     Weight --      Height --      Head Circumference --      Peak Flow --      Pain Score 11/02/16 1648 4     Pain Loc --      Pain Edu? --      Excl. in GC? --    No data found.   Updated Vital Signs BP 133/71 (BP Location: Right Arm)   Pulse 69   Temp 98.5 F (36.9 C) (Oral)   Resp 20   SpO2 100%   Physical Exam  Constitutional: She is oriented to person, place, and time. She appears well-developed and well-nourished. No distress.  HENT:  Head: Normocephalic and atraumatic.  Eyes: Pupils are equal, round, and reactive to light. Conjunctivae are normal.  Cardiovascular: Normal rate, regular rhythm and normal heart sounds.  Exam reveals no gallop and no friction rub.   No murmur heard. Pulmonary/Chest: Effort normal and breath sounds normal. No respiratory distress. She has no wheezes. She has no rales.  Neurological: She is alert and oriented to person, place, and time. She is not disoriented. No cranial nerve deficit or sensory deficit. GCS eye subscore is 4. GCS verbal subscore is 5. GCS motor subscore is 6.  Strength decreased on left grip due to chronic pain/neuropathy, patient states strength at baseline. Patient was able to walk to exam table unassisted without problems.   Skin:  2 insect bite on right forearm with surrounding erythema. No increased warmth, tenderness on palpation  Psychiatric: Her speech is normal. Her mood appears anxious.     UC Treatments / Results  Labs (all labs ordered are listed, but only abnormal results are displayed) Labs Reviewed - No data to display  EKG  EKG Interpretation None       Radiology No results found.  Procedures Procedures (including critical care time)  Medications Ordered in UC Medications - No data to display   Initial Impression / Assessment and Plan / UC Course  I have reviewed the triage vital signs and the nursing notes.  Pertinent labs & imaging results that  were available during my care of the patient were reviewed by me and considered in my medical decision making (see chart for details).    Hydroxyzine for pruritis. Ice compress as needed. Return precautions given.   Discussed case with Dr Sheryle Hail, who examined patient as well, and agrees to plan.  Final Clinical Impressions(s) / UC Diagnoses   Final diagnoses:  Insect bite, initial encounter    New Prescriptions Discharge Medication List as of 11/02/2016  6:18 PM    START taking these medications   Details  hydrOXYzine (ATARAX/VISTARIL) 25 MG tablet Take 1 tablet (25 mg total) by mouth every 6 (six) hours., Starting Fri 11/02/2016, Normal          Cathie Hoops, Amy V, PA-C 11/02/16 2134

## 2016-11-02 NOTE — ED Triage Notes (Signed)
Pt here for insect bite to right forearm she noticed yest  Sx today inlclude swelling, redness, itching, LH and weakness  Pt believes it may be the poison of insect/spider  Sts she had an emergency operation for similar sx 6 years ago  Hx of stroke   A&O x4.. NAD... Ambulatory

## 2016-11-06 ENCOUNTER — Telehealth: Payer: Self-pay | Admitting: Nurse Practitioner

## 2016-11-06 NOTE — Telephone Encounter (Signed)
Spoke with pt, she stated that she really need 2 bedroom apartment and her service dog to be with her. She said that her aid comes in 6 days a week and stay for 4 hours each day. Her dog help protect her since she lives by herself. Pt is prone to fall and has mobility issue due to health condition. She is hoping to change charlotte's mind about paper work that we fill out for apply for 2 bedroom apartment.    Pt has an appt 11/23/2016.

## 2016-11-06 NOTE — Telephone Encounter (Signed)
Patient called stating that Kara Mendoza sent a letter to her landlord stating that she does not need someone with her.  Patient states that she currently has a service dog and has someone stay with her.  Patient states that she has fallen a few times and needs to change apartments.  Patient is requesting a call back in regard.

## 2016-11-07 NOTE — Telephone Encounter (Signed)
I do not certify service animals. I do not have medical reason to change information on form.

## 2016-11-08 NOTE — Telephone Encounter (Signed)
Pt is aware and stated she will see [place] on 11/23/2016.

## 2016-11-23 ENCOUNTER — Ambulatory Visit (INDEPENDENT_AMBULATORY_CARE_PROVIDER_SITE_OTHER): Payer: 59 | Admitting: Nurse Practitioner

## 2016-11-23 ENCOUNTER — Encounter: Payer: Self-pay | Admitting: Nurse Practitioner

## 2016-11-23 ENCOUNTER — Other Ambulatory Visit (INDEPENDENT_AMBULATORY_CARE_PROVIDER_SITE_OTHER): Payer: 59

## 2016-11-23 VITALS — BP 106/66 | HR 71 | Temp 98.6°F | Ht 62.0 in | Wt 171.0 lb

## 2016-11-23 DIAGNOSIS — I10 Essential (primary) hypertension: Secondary | ICD-10-CM | POA: Diagnosis not present

## 2016-11-23 DIAGNOSIS — E1140 Type 2 diabetes mellitus with diabetic neuropathy, unspecified: Secondary | ICD-10-CM

## 2016-11-23 DIAGNOSIS — J45998 Other asthma: Secondary | ICD-10-CM | POA: Diagnosis not present

## 2016-11-23 DIAGNOSIS — E0590 Thyrotoxicosis, unspecified without thyrotoxic crisis or storm: Secondary | ICD-10-CM

## 2016-11-23 DIAGNOSIS — E782 Mixed hyperlipidemia: Secondary | ICD-10-CM | POA: Diagnosis not present

## 2016-11-23 DIAGNOSIS — K219 Gastro-esophageal reflux disease without esophagitis: Secondary | ICD-10-CM | POA: Diagnosis not present

## 2016-11-23 LAB — BASIC METABOLIC PANEL
BUN: 10 mg/dL (ref 6–23)
CO2: 32 mEq/L (ref 19–32)
Calcium: 10.6 mg/dL — ABNORMAL HIGH (ref 8.4–10.5)
Chloride: 99 mEq/L (ref 96–112)
Creatinine, Ser: 0.75 mg/dL (ref 0.40–1.20)
GFR: 98.91 mL/min (ref >60.00)
Glucose, Bld: 91 mg/dL (ref 70–99)
Potassium: 4 mEq/L (ref 3.5–5.1)
Sodium: 137 mEq/L (ref 135–145)

## 2016-11-23 LAB — T3, FREE: T3, Free: 3.3 pg/mL (ref 2.3–4.2)

## 2016-11-23 LAB — TSH: TSH: 0.79 u[IU]/mL (ref 0.35–4.50)

## 2016-11-23 LAB — T4, FREE: Free T4: 0.78 ng/dL (ref 0.60–1.60)

## 2016-11-23 LAB — HEMOGLOBIN A1C: Hgb A1c MFr Bld: 6.3 % (ref 4.6–6.5)

## 2016-11-23 NOTE — Patient Instructions (Addendum)
Please inquire from [facility] about what you need in order to be seen by a specialist in their group.  Go to basement for blood draw.

## 2016-11-23 NOTE — Progress Notes (Signed)
Subjective:  Patient ID: Kara Mendoza, female    DOB: 12-May-1950  Age: 66 y.o. MRN: [id number]  CC: Follow-up (6 mo fu- referral for arms surgery at [facility]? arms pain/ seeing Dr. Chrissie Noa for the arms pain problem on 10/23) and Medication Problem (hydroxyzine consult for her tooth 12/11/2016)   HPI Chronic left arm pain: Has appt 11/27/2016 with Dr. Mayford Knife (chiropractor in [place]). She is looking into getting second opinion from [facility].  HTN: Controlled with Hyzaar BP Readings from Last 3 Encounters:  11/23/16 106/66  11/02/16 133/71  09/11/16 113/73   DM: Controlled with metformin Last hgb A1c of 6.2  Outpatient Medications Prior to Visit  Medication Sig Dispense Refill  . acetaminophen (TYLENOL) 500 MG tablet Take 2 tablets (1,000 mg total) by mouth every 6 (six) hours as needed. 30 tablet 0  . clopidogrel (PLAVIX) 75 MG tablet Take 1 tablet (75 mg total) by mouth daily. (Patient taking differently: Take 75 mg by mouth daily. Pt reports taking a old prescription of this medications as she ran out two days ago of her current prescription. Note the prescription was out of date as 01/2015.) 90 tablet 3  . diclofenac sodium (VOLTAREN) 1 % GEL Apply 2 g topically 4 (four) times daily. 3 Tube 5  . DULoxetine (CYMBALTA) 60 MG capsule Take 1 capsule (60 mg total) by mouth daily. 30 capsule 2  . gabapentin (NEURONTIN) 300 MG capsule Take 3 capsules (900 mg total) by mouth 3 (three) times daily. 180 capsule 2  . Glucose Blood (BLOOD GLUCOSE TEST STRIPS) STRP Inject 1 Units into the skin 4 (four) times daily -  before meals and at bedtime. OneTouch Ultra mini strips (Patient taking differently: Inject 1 Units into the skin 4 (four) times daily -  before meals and at bedtime. OneTouch Ultra mini strips) 100 each 3  . hydrOXYzine (ATARAX/VISTARIL) 25 MG tablet Take 1 tablet (25 mg total) by mouth every 6 (six) hours. 16 tablet 0  . Multiple Vitamins-Calcium  (ONE-A-DAY WOMENS FORMULA PO) Take 1 tablet by mouth daily.    Letta Pate DELICA LANCETS FINE MISC 1 Units by Does not apply route 4 (four) times daily -  before meals and at bedtime. (Patient taking differently: 1 Units by Does not apply route 4 (four) times daily -  before meals and at bedtime. ) 100 each 3  . zolpidem (AMBIEN) 5 MG tablet Take 1 tablet (5 mg total) by mouth at bedtime as needed for sleep. 30 tablet 2  . albuterol (PROVENTIL HFA;VENTOLIN HFA) 108 (90 Base) MCG/ACT inhaler Inhale 2 puffs into the lungs every 6 (six) hours as needed for wheezing or shortness of breath. 1 Inhaler 2  . atorvastatin (LIPITOR) 40 MG tablet TAKE 1 TABLET(40 MG) BY MOUTH DAILY AT 6 PM 90 tablet 1  . losartan-hydrochlorothiazide (HYZAAR) 100-25 MG tablet Take 1 tablet by mouth daily. 90 tablet 1  . metFORMIN (GLUCOPHAGE) 500 MG tablet TAKE 1 TABLET(500 MG) BY MOUTH DAILY WITH BREAKFAST 90 tablet 1  . omeprazole (PRILOSEC) 20 MG capsule TAKE 1 CAPSULE(20 MG) BY MOUTH TWICE DAILY 180 capsule 1   No facility-administered medications prior to visit.     ROS Review of Systems  Constitutional: Negative for malaise/fatigue.  Respiratory: Negative for cough and shortness of breath.   Cardiovascular: Negative for chest pain, palpitations and leg swelling.  Gastrointestinal: Negative for abdominal pain, constipation, diarrhea, nausea and vomiting.  Musculoskeletal: Negative for falls.  Psychiatric/Behavioral:  Negative for suicidal ideas. The patient does not have insomnia.     Objective:  BP 106/66   Pulse 71   Temp 98.6 F (37 C)   Ht 5\' 2"  (1.575 m)   Wt 171 lb (77.6 kg)   SpO2 100%   BMI 31.28 kg/m   BP Readings from Last 3 Encounters:  11/23/16 106/66  11/02/16 133/71  09/11/16 113/73    Wt Readings from Last 3 Encounters:  11/23/16 171 lb (77.6 kg)  09/11/16 165 lb (74.8 kg)  06/22/16 170 lb (77.1 kg)    Physical Exam  Neck: Normal range of motion. Neck supple.  Cardiovascular:  Normal rate and regular rhythm.   Pulmonary/Chest: Effort normal and breath sounds normal.  Abdominal: Soft.  Musculoskeletal: She exhibits edema and tenderness.  Left UE edema and tenderness.  Neurological: She is alert.  Vitals reviewed.   Lab Results  Component Value Date   WBC 14.7 (H) 12/07/2015   HGB 14.3 12/07/2015   HCT 42.0 12/07/2015   PLT 379 12/07/2015   GLUCOSE 91 11/23/2016   CHOL 240 (H) 05/01/2016   TRIG 98.0 05/01/2016   HDL 48.00 05/01/2016   LDLCALC 172 (H) 05/01/2016   ALT 19 05/01/2016   AST 12 05/01/2016   NA 137 11/23/2016   K 4.0 11/23/2016   CL 99 11/23/2016   CREATININE 0.75 11/23/2016   BUN 10 11/23/2016   CO2 32 11/23/2016   TSH 0.79 11/23/2016   INR 0.93 12/07/2015   HGBA1C 6.3 11/23/2016    No results found.  Assessment & Plan:   Kara Mendoza was seen today for follow-up and medication problem.  Diagnoses and all orders for this visit:  Type 2 diabetes mellitus with diabetic neuropathy, without long-term current use of insulin (HCC) -     Hemoglobin A1c; Future -     metFORMIN (GLUCOPHAGE) 500 MG tablet; TAKE 1 TABLET(500 MG) BY MOUTH DAILY WITH BREAKFAST  Hyperthyroidism -     TSH; Future -     T4, free; Future -     T3, free; Future  Essential hypertension -     Basic metabolic panel; Future -     losartan-hydrochlorothiazide (HYZAAR) 100-25 MG tablet; Take 1 tablet by mouth daily.  Mixed hyperlipidemia -     atorvastatin (LIPITOR) 40 MG tablet; TAKE 1 TABLET(40 MG) BY MOUTH DAILY AT 6 PM  Gastroesophageal reflux disease, esophagitis presence not specified -     ranitidine (ZANTAC) 150 MG tablet; Take 1 tablet (150 mg total) by mouth 2 (two) times daily.  Asthma night-time symptoms -     albuterol (PROVENTIL HFA;VENTOLIN HFA) 108 (90 Base) MCG/ACT inhaler; Inhale 2 puffs into the lungs every 6 (six) hours as needed for wheezing or shortness of breath.   I have discontinued Kara Mendoza omeprazole. I am also having her start on  ranitidine. Additionally, I am having her maintain her Multiple Vitamins-Calcium (ONE-A-DAY WOMENS FORMULA PO), zolpidem, acetaminophen, clopidogrel, ONETOUCH DELICA LANCETS FINE, BLOOD GLUCOSE TEST STRIPS, diclofenac sodium, DULoxetine, gabapentin, hydrOXYzine, metFORMIN, atorvastatin, losartan-hydrochlorothiazide, and albuterol.  Meds ordered this encounter  Medications  . metFORMIN (GLUCOPHAGE) 500 MG tablet    Sig: TAKE 1 TABLET(500 MG) BY MOUTH DAILY WITH BREAKFAST    Dispense:  90 tablet    Refill:  1    **Patient requests 90 days supply**    Order Specific Question:   Supervising Provider    Answer:   Pincus Sanes [[id number]]  . atorvastatin (LIPITOR) 40 MG tablet  Sig: TAKE 1 TABLET(40 MG) BY MOUTH DAILY AT 6 PM    Dispense:  90 tablet    Refill:  1    **Patient requests 90 days supply**    Order Specific Question:   Supervising Provider    Answer:   Pincus SanesBURNS, STACY J [[id number]]  . losartan-hydrochlorothiazide (HYZAAR) 100-25 MG tablet    Sig: Take 1 tablet by mouth daily.    Dispense:  90 tablet    Refill:  1    **Patient requests 90 days supply**    Order Specific Question:   Supervising Provider    Answer:   Pincus SanesBURNS, STACY J [[id number]]  . albuterol (PROVENTIL HFA;VENTOLIN HFA) 108 (90 Base) MCG/ACT inhaler    Sig: Inhale 2 puffs into the lungs every 6 (six) hours as needed for wheezing or shortness of breath.    Dispense:  1 Inhaler    Refill:  2    Order Specific Question:   Supervising Provider    Answer:   Pincus SanesBURNS, STACY J [[id number]]  . ranitidine (ZANTAC) 150 MG tablet    Sig: Take 1 tablet (150 mg total) by mouth 2 (two) times daily.    Dispense:  180 tablet    Refill:  1    Order Specific Question:   Supervising Provider    Answer:   Pincus SanesBURNS, STACY J [[id number]]    Follow-up: Return in about 6 months (around 05/24/2017) for DM and HTN, hyperlipidemia.  Alysia Pennaharlotte Nche, NP

## 2016-11-26 MED ORDER — ALBUTEROL SULFATE HFA 108 (90 BASE) MCG/ACT IN AERS: 2.0000 | INHALATION_SPRAY | Freq: Four times a day (QID) | RESPIRATORY_TRACT | 2 refills | Status: DC | PRN

## 2016-11-26 MED ORDER — ATORVASTATIN CALCIUM 40 MG PO TABS: ORAL_TABLET | ORAL | 1 refills | Status: DC

## 2016-11-26 MED ORDER — RANITIDINE HCL 150 MG PO TABS: 150.0000 mg | ORAL_TABLET | Freq: Two times a day (BID) | ORAL | 1 refills | Status: DC

## 2016-11-26 MED ORDER — LOSARTAN POTASSIUM-HCTZ 100-25 MG PO TABS: 1.0000 | ORAL_TABLET | Freq: Every day | ORAL | 1 refills | Status: DC

## 2016-11-26 MED ORDER — METFORMIN HCL 500 MG PO TABS: ORAL_TABLET | ORAL | 1 refills | Status: DC

## 2016-11-26 NOTE — Assessment & Plan Note (Signed)
Omeprazole switched to ranitidine due to DDI with plavix

## 2016-11-29 ENCOUNTER — Ambulatory Visit: Payer: 59 | Admitting: Nurse Practitioner

## 2016-11-29 ENCOUNTER — Telehealth: Payer: Self-pay | Admitting: Nurse Practitioner

## 2016-11-29 DIAGNOSIS — G5602 Carpal tunnel syndrome, left upper limb: Secondary | ICD-10-CM

## 2016-11-29 DIAGNOSIS — G5601 Carpal tunnel syndrome, right upper limb: Secondary | ICD-10-CM

## 2016-11-29 DIAGNOSIS — G5643 Causalgia of bilateral upper limbs: Secondary | ICD-10-CM

## 2016-11-29 DIAGNOSIS — G589 Mononeuropathy, unspecified: Secondary | ICD-10-CM

## 2016-11-29 DIAGNOSIS — I82722 Chronic embolism and thrombosis of deep veins of left upper extremity: Secondary | ICD-10-CM

## 2016-11-29 NOTE — Telephone Encounter (Signed)
Patient is requesting a referral for a Chiropractors, due to neuropathy . Her health care helper was on the phone as well and did not want to give me much information. Stated they wanted to speak with the nurse. Please follow up. Thank you.

## 2016-11-29 NOTE — Telephone Encounter (Signed)
Pt is aware. Pt stated she can go to any chiropractic if need be.

## 2016-12-13 ENCOUNTER — Other Ambulatory Visit: Payer: Self-pay | Admitting: Physician Assistant

## 2016-12-14 ENCOUNTER — Other Ambulatory Visit: Payer: Self-pay | Admitting: Internal Medicine

## 2016-12-17 ENCOUNTER — Ambulatory Visit (INDEPENDENT_AMBULATORY_CARE_PROVIDER_SITE_OTHER): Payer: 59 | Admitting: Neurology

## 2016-12-17 ENCOUNTER — Encounter: Payer: Self-pay | Admitting: Neurology

## 2016-12-17 VITALS — BP 118/77 | HR 79 | Wt 173.2 lb

## 2016-12-17 DIAGNOSIS — E785 Hyperlipidemia, unspecified: Secondary | ICD-10-CM | POA: Diagnosis not present

## 2016-12-17 DIAGNOSIS — G90513 Complex regional pain syndrome I of upper limb, bilateral: Secondary | ICD-10-CM | POA: Diagnosis not present

## 2016-12-17 DIAGNOSIS — E1159 Type 2 diabetes mellitus with other circulatory complications: Secondary | ICD-10-CM

## 2016-12-17 DIAGNOSIS — Z8673 Personal history of transient ischemic attack (TIA), and cerebral infarction without residual deficits: Secondary | ICD-10-CM

## 2016-12-17 DIAGNOSIS — I480 Paroxysmal atrial fibrillation: Secondary | ICD-10-CM | POA: Diagnosis not present

## 2016-12-17 NOTE — Progress Notes (Signed)
STROKE NEUROLOGY FOLLOW UP NOTE  NAME: Kara Mendoza DOB: 1950/10/21  REASON FOR VISIT: stroke follow up HISTORY FROM: pt and chart  Today we had the pleasure of seeing Kara Mendoza in follow-up at our Neurology Clinic. Pt was accompanied by no one.   History Summary Kara Mendoza is a 66 y.o. female with prior hx of left MCA distribution infarct in 2008 with residual language impairment, hypertension, dyslipidemia, tobacco abuse, admitted with stuttering speech and LUE pain and fluctuating weakness. Did not receive tPA due to unclear last known well. Imaging confirms chronic left parietal infarct with new extension into the left parietal operculum, left insula, and left inferior parietal lobe, surrounding the area of chronic infarction. Chronic microvascular ischemic change. 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 Stroke etiology felt to be embolic, though no source found (cryptogenic). Hypercoagulable work up so far unrevealing and TEE neg. Loop recorder inserted. She was discharged with plavix and zocor.  Follow up 09/29/13 - the patient has been doing better with speech although she will have stuttering if she is anxious. She still complains of LUE pain, painful to touch, stated that it sometimes swelling, cold, redness, which has been going for about one year. She will have outpt neurology referral by PCP to see Dr. Caroline Sauger at [facility] for this. She has Loop recorder implanted and so far she did not receive any phone call from cardiology.    Follow up 01/06/14 - she was doing a little bit better. TCD emboli detection negative. She had follow up with Dr. Caroline Sauger for left UE RDS and was put on gabapentin and discontinued lyrica. She felt pain is better controlled, however, she stated that combination of percocet and gabapentin is more effective but she has no percocet left. She needs to see Dr. Wess Botts  for more percocet prescription. She also followed with cardiology and loop recorder so far no AF episodes. She also follows with Dr. Carlis Abbott for thyroid issue for which she is taking methimazole. Otherwise, she has no complain of neurological deficit, she still has mild stuttering which is chronic. Pt continued smoking, although cutting down from 2PPD to 3 cig per day.  07/08/14 follow up - she still has pain on the left arm due to RSD. She followed up with Dr. Caroline Sauger in [facility] for RDS but she is currently seeing orthopedics in [facility] for left arm pain, concerning for RSD or CRPS in another term. She is taking Cymbalta, gabapentin and Lyrica for the pain. She has not been taking Plavix, because she was told that Plavix can cause stroke in her. No recurrent strokelike symptoms, blood pressure today 117/82.  12/23/14 follow up - she still has pain on the left arm due to RSD. She has been seen by Dr. Caroline Sauger in [facility] and also [facility] orthopedics for this. As per pt, she was recommended to follow up with [facility] neurology for some new procedures for treatment of RSD. She has not seen [facility] neurology yet. She continued to experience pain in the left arm, swelling, hot feeling, up to the upper arm. Lidocaine cream helped but she ran out of it. I encouraged her to continue follow up with [facility] neurology for RSD and will refill her lidocaine cream.   06/05/16 follow up - pt has been doing well from stroke standpoint. She went to [facility] supposedly for her RDS but was seen for stroke follow  up. She decided to stop seeing neurologist in [facility] or [facility] and would like to stick with local [place] neurologist for her RDS. She continued to have left arm pain to touch, swelling in hands, involving entire arm. Using lidocaine cream helps, also on gabapentin and cymbalta, but percocet was discontinued from her PCP. Recently, she started to have similar pain in right arm and hand, but mild, with intermittent right hand swelling. She was afraid that  her right arm will get the same thing. She denies any stress, anxiety but admitted that she has some issues with her landlord for her apartment. BP today 135/88.   09/11/2016 follow up - pt has been doing the same. LUE continues to have pain to touch, burning in the left thumb, the feeling seemed to spread to her RUE and right thumb. She is on 600mg  gabapentin tid and 60mg  cymbalta, effective, but short duration and not able to cure it. LDL still high but A1C in good control. BP today 113/73.   Interval History During the interval time, pt has been doing the same. 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 She is yet to call pain management to make appointment for further pain control. We went through current clinical trials for CPRS in [place] and national wide, but none of them she is willing to go either due to location or no interest in the treatment offered.   REVIEW OF SYSTEMS: Full 14 system review of systems performed and notable only for those listed below and in HPI above, all others are negative:  Constitutional: fatigue  Cardiovascular: palpitation  Ear/Nose/Throat: Ringing in ears, hearing loss Skin: Moles, itching Eyes: double vision, eye discharge  Respiratory: N/A  Gastroitestinal:   Genitourinary: Incontinence of bladder, frequency of urination Hematology/Lymphatic: Bruce/bleed easily Endocrine: heat intolerance  Musculoskeletal: neck stiffness, walking difficulty, joint pain Allergy/Immunology: N/A  Neurological: Numbness, headache, speech difficulty, weakness  Psychiatric: restless leg, depression, nervous/anxious  The following represents the patient's updated allergies and side effects list: Allergies  Allergen Reactions  . Aspirin Hives, Itching and Other (See Comments)  . Diclofenac Shortness Of Breath    Only with po diclofenac  . Penicillins Hives    Has patient had a PCN reaction causing immediate rash,  facial/tongue/throat swelling, SOB or lightheadedness with hypotension: Yes Has patient had a PCN reaction causing severe rash involving mucus membranes or skin necrosis: No Has patient had a PCN reaction that required hospitalization No Has patient had a PCN reaction occurring within the last 10 years: No If all of the above answers are "NO", then may proceed with Cephalosporin use.     Labs since last visit of relevance include the following: Results for orders placed or performed in visit on 11/23/16  Basic metabolic panel  Result Value Ref Range   Sodium 137 135 - 145 mEq/L   Potassium 4.0 3.5 - 5.1 mEq/L   Chloride 99 96 - 112 mEq/L   CO2 32 19 - 32 mEq/L   Glucose, Bld 91 70 - 99 mg/dL   BUN 10 6 - 23 mg/dL   Creatinine, Ser 1.610.75 0.40 - 1.20 mg/dL   Calcium 09.610.6 (H) 8.4 - 10.5 mg/dL   GFR 04.5498.91 >09.81>60.00 mL/min  TSH  Result Value Ref Range   TSH 0.79 0.35 - 4.50 uIU/mL  T4, free  Result Value Ref Range   Free T4 0.78 0.60 - 1.60 ng/dL  T3,  free  Result Value Ref Range   T3, Free 3.3 2.3 - 4.2 pg/mL  Hemoglobin A1c  Result Value Ref Range   Hgb A1c MFr Bld 6.3 4.6 - 6.5 %    The neurologically relevant items on the patient's problem list were reviewed on today's visit.  Neurologic Examination  A problem focused neurological exam (12 or more points of the single system neurologic examination, vital signs counts as 1 point, cranial nerves count for 8 points) was performed.  Blood pressure 118/77, pulse 79, weight 173 lb 3.2 oz (78.6 kg).  General - Well nourished, well developed, in no apparent distress.  Ophthalmologic - not able to see through.  Cardiovascular - Regular rate and rhythm with no murmur.  Mental Status -  Level of arousal and orientation to time, place, and person were intact. Language including expression, naming, repetition, comprehension was assessed and found intact., but with stuttering if anxious  Cranial Nerves II - XII - II - Visual field  intact OU. III, IV, VI - Extraocular movements intact. V - Facial sensation intact bilaterally. VII - Facial movement intact bilaterally. VIII - Hearing & vestibular intact bilaterally. X - Palate elevates symmetrically, mild stuttering. XI - Chin turning & shoulder shrug intact bilaterally. XII - Tongue protrusion intact.  Motor Strength - The patient's strength was normal in all extremities and pronator drift was absent.  Bulk was normal and fasciculations were absent.  Motor Tone - Muscle tone was assessed at the neck and appendages and was normal.  Reflexes - The patient's reflexes were 1+ in all extremities and she had no pathological reflexes.  Sensory - sensitive and painful on touch at left entire arm, mild painful on touch at right thumb.  Coordination - The patient had normal movements in the hands with no ataxia or dysmetria.  Tremor was absent.  Gait and Station - normal gait.  Data reviewed: I personally reviewed the images and agree with the radiology interpretations.  Dg Chest 2 View  08/09/2013 IMPRESSION: No active cardiopulmonary disease.  Ct Head Wo Contrast  08/09/2013 IMPRESSION: Stable chronic left MCA distribution infarction. No acute intracranial abnormality.  Ct Angio Head and neck W/cm &/or Wo Cm  08/09/2013 IMPRESSION: Negative study. No evidence of atherosclerotic narrowing or dissection. No vessel occlusion. No aneurysm or vascular malformation.  MRI/MRA  08/10/2013 IMPRESSION: Chronic left parietal infarct. Chronic microvascular ischemic change. Extension of acute infarct into the left parietal operculum, left insula, and left inferior parietal lobe, surrounding the area of chronic infarction. Small irregular distal left vertebral artery. 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  Carotid Doppler CTA neck done  2D Echocardiogram 08/10/13 EF 55-60%, no cardiac source of emboli identified    LUE doppler There is no DVT or SVT noted in the left upper extremity.  Transesophageal Echocardiogram 08/12/13 Impression:  1) No source of embolus  2) Negative bubble study  3) Normal EF 60%  4) No significant valve disease  5) No LAA thrombus  6) No aortic debris  7) Normal RV  8) No effusion  EKG 08/09/13 Sinus rhythm  RAE, consider biatrial enlargement  Anteroseptal infarct, old   Loop recorder - no afib  Component     Latest Ref Rng 08/11/2013  Anticardiolipin IgA     <22 APL U/mL 3 (L)  Anticardiolipin IgG     <23 GPL U/mL 2 (L)  Anticardiolipin IgM     <  11 MPL U/mL 3 (L)  PTT Lupus Anticoagulant     28.0 - 43.0 secs 33.0  PTTLA Confirmation     <8.0 secs NOT APPL  PTTLA 4:1 Mix     28.0 - 43.0 secs NOT APPL  DRVVT     <42.9 secs 32.5  Drvvt confirmation     <1.15 Ratio NOT APPL  dRVVT Incubated 1:1 Mix     <42.9 secs NOT APPL  Lupus Anticoagulant     NOT DETECTED NOT DETECTED  Phosphatydalserine, IgG     <16 U/mL 4  Phosphatydalserine, IgM     <22 U/mL 7  Phosphatydalserine, IgA     <20 U/mL 5  Total Protein     6.0 - 8.3 g/dL 6.6  Albumin     3.5 - 5.2 g/dL 3.7  AST     0 - 37 U/L 14  ALT     0 - 35 U/L 15  Alkaline Phosphatase     39 - 117 U/L 59  Total Bilirubin     0.3 - 1.2 mg/dL 0.6  Bilirubin, Direct     0.0 - 0.3 mg/dL <1.6  Indirect Bilirubin     0.3 - 0.9 mg/dL NOT CALCULATED  c-ANCA Screen     NEGATIVE NEGATIVE  p-ANCA Screen     NEGATIVE NEGATIVE  Atypical p-ANCA Screen     NEGATIVE NEGATIVE  TSH     0.350 - 4.500 uIU/mL   Vitamin B-12     211 - 911 pg/mL 1016 (H)  Folate      >20.0  RPR     NON REAC NON REAC  HIV     NONREACTIVE NONREACTIVE  Sed Rate     0 - 22 mm/hr 3  CRP, High Sensitivity      1.3  C3 Complement     90 - 180 mg/dL 109  Complement C4, Body Fluid     10 - 40 mg/dL 28  ds DNA Ab      <1  Homocysteine     4.0 - 15.4 umol/L 6.5  SSA (Ro) (ENA) Antibody, IgG     <1.0 NEG AI <1.0 NEG  SSB (La) (ENA)  Antibody, IgG     <1.0 NEG AI <1.0 NEG  Rheumatoid Factor     <=14 IU/mL <10    Assessment: As you may recall, she is a 66 y.o. African American female with PMH of left MCA distribution infarct in 2008 with residual language impairment, hypertension, dyslipidemia, tobacco abuse, found to have again left parietal and frontal small embolic pattern strokes in 08/2013, appears like extension from previous 2008 stroke. Felt like embolic, work up negative and hypercoag labs were not remarkable. TCD emboli monitoring negative for MES. Loop recorder placed and found AF episode in 07/2015. She was put on Xarelto by cardiology but pt later did not want to continue. She was then put back on plavix.   She does have left arm RSD or CRPS with developing mild RSD at right thumb, and she has decided to follow-up here instead of [facility] or [facility]. Her gabapentin increased to 600 mg 3 times a day and Cymbalta to 60 mg. However, pt continues to have similar symptoms. Discussed with pt about further options, now on gabapentin to 900mg  tid and cymbalta and she is going to call pain management. In the meantime, encourage pt exercising positive feedback and keep active.    Plan:  - continue plavix and statin for stroke prevention -  check BP and glucose at home - Follow up with your primary care physician for stroke risk factor modification. Recommend maintain blood pressure goal <130/80, diabetes with hemoglobin A1c goal below 6.5% and lipids with LDL cholesterol goal below 70 mg/dL.  - continue cymbalta to 60mg  daily - increase gabapentin to 900mg  three times a day.  - make appointment with pain management - quit smoking completely. - positive feedback to self to increase pain threshold. Recommend more activities, such as volunteer work, new relationship, etc.   - follow up 6 months.  I spent more than 25 minutes of face to face time with the patient. Greater than 50% of time was spent in counseling and coordination of  care. We have discussed about the treatment option and current clinical trials available for RSD or CRPS.    No orders of the defined types were placed in this encounter.  Meds ordered this encounter  Medications  . DISCONTD: diclofenac sodium (VOLTAREN) 1 % GEL    Sig: Apply topically.    Patient Instructions  - continue plavix and statin for stroke prevention - check BP and glucose at home - Follow up with your primary care physician for stroke risk factor modification. Recommend maintain blood pressure goal <130/80, diabetes with hemoglobin A1c goal below 6.5% and lipids with LDL cholesterol goal below 70 mg/dL.  - continue cymbalta to 60mg  daily - increase gabapentin to 900mg  three times a day.  - make appointment with pain management - quit smoking completely. - positive feedback to self to increase pain threshold. Recommend more activities, such as volunteer work, new relationship, etc.   - follow up 6 months.   [name], MD PhD [facility] [address], [address] [place], [place] [address] [phone]

## 2016-12-17 NOTE — Patient Instructions (Addendum)
-   continue plavix and statin for stroke prevention - check BP and glucose at home - Follow up with your primary care physician for stroke risk factor modification. Recommend maintain blood pressure goal <130/80, diabetes with hemoglobin A1c goal below 6.5% and lipids with LDL cholesterol goal below 70 mg/dL.  - continue cymbalta to 60mg  daily - increase gabapentin to 900mg  three times a day.  - make appointment with pain management - quit smoking completely. - positive feedback to self to increase pain threshold. Recommend more activities, such as volunteer work, new relationship, etc.   - follow up 6 months.

## 2016-12-19 ENCOUNTER — Telehealth: Payer: Self-pay | Admitting: Neurology

## 2016-12-19 NOTE — Telephone Encounter (Addendum)
Pt called said they have decided on [place]. She said the facility will need the records. I advised her she will need to sign a medical release. Pt said you don't understand, me and the doctor already talked about this but I will come and sign it if I have to, but I would like to talk with the Dr. I told her I would send the msg to him, she was appreciative.

## 2016-12-19 NOTE — Telephone Encounter (Signed)
Discussed with patient over the phone.  She is now interested for RSD clinical trials.  We had search for clinical trials website and I provided several locations for her to contact and decide.  Shortly after, she called back and selected clinical [facility] Center [facility] [place] [facility] [place] for further information and potential enrollment.  She stated that she will have appointment with them tomorrow morning, if she is interested, the clinical research Center in [place] will contact us for further medical record.  I feel this is reasonable and will provide medical record once receives request.  Patient expressed understanding and appreciation.  Marvel PlanJindong Bethzy Hauck, MD PhD Stroke Neurology 12/19/2016 4:45 PM

## 2017-01-14 ENCOUNTER — Ambulatory Visit: Payer: 59 | Admitting: Podiatry

## 2017-01-18 ENCOUNTER — Other Ambulatory Visit: Payer: Self-pay | Admitting: Physician Assistant

## 2017-01-31 ENCOUNTER — Other Ambulatory Visit: Payer: Self-pay | Admitting: Nurse Practitioner

## 2017-01-31 DIAGNOSIS — G5643 Causalgia of bilateral upper limbs: Secondary | ICD-10-CM

## 2017-01-31 DIAGNOSIS — F339 Major depressive disorder, recurrent, unspecified: Secondary | ICD-10-CM

## 2017-02-06 ENCOUNTER — Other Ambulatory Visit: Payer: Self-pay | Admitting: Nurse Practitioner

## 2017-02-16 ENCOUNTER — Emergency Department (HOSPITAL_COMMUNITY): Admission: EM | Admit: 2017-02-16 | Discharge: 2017-02-16 | Discharge status: Against Medical Advice | Payer: 59

## 2017-02-18 ENCOUNTER — Other Ambulatory Visit: Payer: Self-pay | Admitting: Physician Assistant

## 2017-02-22 ENCOUNTER — Encounter: Payer: Self-pay | Admitting: Nurse Practitioner

## 2017-02-22 ENCOUNTER — Encounter (HOSPITAL_COMMUNITY): Payer: Self-pay

## 2017-02-22 ENCOUNTER — Emergency Department (HOSPITAL_COMMUNITY)
Admission: EM | Admit: 2017-02-22 | Discharge: 2017-02-22 | Disposition: A | Discharge status: Home / Self Care | Payer: 59 | Attending: Emergency Medicine | Admitting: Emergency Medicine

## 2017-02-22 ENCOUNTER — Ambulatory Visit (INDEPENDENT_AMBULATORY_CARE_PROVIDER_SITE_OTHER): Payer: 59 | Admitting: Nurse Practitioner

## 2017-02-22 ENCOUNTER — Emergency Department (HOSPITAL_COMMUNITY): Payer: 59

## 2017-02-22 ENCOUNTER — Other Ambulatory Visit: Payer: Self-pay

## 2017-02-22 VITALS — BP 92/52 | HR 99 | Temp 99.1°F | Resp 16 | Ht 62.0 in | Wt 169.4 lb

## 2017-02-22 DIAGNOSIS — I10 Essential (primary) hypertension: Secondary | ICD-10-CM | POA: Diagnosis not present

## 2017-02-22 DIAGNOSIS — Z79899 Other long term (current) drug therapy: Secondary | ICD-10-CM | POA: Insufficient documentation

## 2017-02-22 DIAGNOSIS — J45909 Unspecified asthma, uncomplicated: Secondary | ICD-10-CM | POA: Insufficient documentation

## 2017-02-22 DIAGNOSIS — R531 Weakness: Secondary | ICD-10-CM

## 2017-02-22 DIAGNOSIS — R55 Syncope and collapse: Secondary | ICD-10-CM | POA: Diagnosis not present

## 2017-02-22 DIAGNOSIS — E119 Type 2 diabetes mellitus without complications: Secondary | ICD-10-CM | POA: Diagnosis not present

## 2017-02-22 DIAGNOSIS — F17210 Nicotine dependence, cigarettes, uncomplicated: Secondary | ICD-10-CM | POA: Diagnosis not present

## 2017-02-22 DIAGNOSIS — Z7902 Long term (current) use of antithrombotics/antiplatelets: Secondary | ICD-10-CM | POA: Insufficient documentation

## 2017-02-22 DIAGNOSIS — Z7984 Long term (current) use of oral hypoglycemic drugs: Secondary | ICD-10-CM | POA: Diagnosis not present

## 2017-02-22 LAB — COMPREHENSIVE METABOLIC PANEL
ALT: 17 U/L (ref 14–54)
AST: 15 U/L (ref 15–41)
Albumin: 4.2 g/dL (ref 3.5–5.0)
Alkaline Phosphatase: 59 U/L (ref 38–126)
Anion gap: 7 (ref 5–15)
BUN: 12 mg/dL (ref 6–20)
CO2: 30 mmol/L (ref 22–32)
Calcium: 10 mg/dL (ref 8.9–10.3)
Chloride: 100 mmol/L — ABNORMAL LOW (ref 101–111)
Creatinine, Ser: 0.72 mg/dL (ref 0.44–1.00)
GFR calc Af Amer: >60 mL/min (ref >60)
GFR calc non Af Amer: >60 mL/min (ref >60)
Glucose, Bld: 95 mg/dL (ref 65–99)
Potassium: 3.7 mmol/L (ref 3.5–5.1)
Sodium: 137 mmol/L (ref 135–145)
Total Bilirubin: 0.6 mg/dL (ref 0.3–1.2)
Total Protein: 7.2 g/dL (ref 6.5–8.1)

## 2017-02-22 LAB — URINALYSIS, ROUTINE W REFLEX MICROSCOPIC
Bilirubin Urine: NEGATIVE
Glucose, UA: NEGATIVE mg/dL
Hgb urine dipstick: NEGATIVE
Ketones, ur: NEGATIVE mg/dL
Leukocytes, UA: NEGATIVE
Nitrite: NEGATIVE
Protein, ur: NEGATIVE mg/dL
Specific Gravity, Urine: 1.024 (ref 1.005–1.030)
pH: 5 (ref 5.0–8.0)

## 2017-02-22 LAB — CBC WITH DIFFERENTIAL/PLATELET
Basophils Absolute: 0 10*3/uL (ref 0.0–0.1)
Basophils Relative: 0 %
Eosinophils Absolute: 0.1 10*3/uL (ref 0.0–0.7)
Eosinophils Relative: 1 %
HCT: 41.3 % (ref 36.0–46.0)
Hemoglobin: 14.3 g/dL (ref 12.0–15.0)
Lymphocytes Relative: 36 %
Lymphs Abs: 3.3 10*3/uL (ref 0.7–4.0)
MCH: 30.1 pg (ref 26.0–34.0)
MCHC: 34.6 g/dL (ref 30.0–36.0)
MCV: 86.9 fL (ref 78.0–100.0)
Monocytes Absolute: 0.5 10*3/uL (ref 0.1–1.0)
Monocytes Relative: 5 %
Neutro Abs: 5.3 10*3/uL (ref 1.7–7.7)
Neutrophils Relative %: 58 %
Platelets: 355 10*3/uL (ref 150–400)
RBC: 4.75 MIL/uL (ref 3.87–5.11)
RDW: 12.4 % (ref 11.5–15.5)
WBC: 9.3 10*3/uL (ref 4.0–10.5)

## 2017-02-22 NOTE — ED Notes (Signed)
Patient transported to CT 

## 2017-02-22 NOTE — ED Provider Notes (Signed)
[facility]-EMERGENCY DEPT Provider Note   CSN: [id number] Arrival date & time: 02/22/17  1602     History   Chief Complaint Chief Complaint  Patient presents with  . Loss of Consciousness    HPI Kara Mendoza is a 67 y.o. female.  Syncopal spell on 02/16/17.  She went to her primary care doctor today and referred her here for further evaluation.  Review of systems positive for dizziness and left temporal headache with questionable blurred vision.  She stutters but this is a remnant from her previous strokes.  She is ambulatory without obvious neurological deficits.  Past medical history includes neuromuscular disorder, hypertension, hyperlipidemia, diabetes, depression, anxiety, chronic pain, many others.  No fever, sweats, chills, chest pain, dyspnea, dysuria.      Past Medical History:  Diagnosis Date  . Abnormality of gait   . Acute bronchitis   . Allergy   . Anxiety   . Anxiety state, unspecified   . Asthma   . Chronic arm pain 2008   left  . Chronic headache   . Chronic leg pain 2008   left  . Chronic pain syndrome   . Constipation   . Diabetes mellitus (HCC)   . Dysarthria   . Esophageal reflux   . Fatigue   . History of fall   . Hyperlipidemia   . Hypertension   . Hyperthyroidism   . LOOP Recorder 10/14/2013  . Major depressive disorder, recurrent episode, moderate (HCC)   . Memory loss   . Neuromuscular disorder (HCC)   . Osteoarthritis   . Stroke (HCC)   . Tobacco use disorder     Patient Active Problem List   Diagnosis Date Noted  . Residual cognitive deficit as late effect of stroke 04/26/2016  . Hemiparesis affecting left side as late effect of cerebrovascular accident (CVA) (HCC) 04/26/2016  . Carpal tunnel syndrome on left 11/29/2015  . DDD (degenerative disc disease), cervical 11/29/2015  . Depression 11/29/2015  . Chronic pain of left knee 09/04/2015  . Headache 09/04/2015  . Hypercalcemia 08/06/2015  . Pain in  right upper arm 08/06/2015  . Paroxysmal atrial fibrillation (HCC) 07/22/2015  . History of loop recorder 04/11/2015  . History of stroke 03/17/2015  . Shortness of breath 06/23/2014  . Complex regional pain syndrome type 2 of upper extremity 11/05/2013  . Carpal tunnel syndrome on right 10/22/2013  . Chronic embolism and thrombosis of deep vein of left upper extremity (HCC) 10/22/2013  . Left arm pain 10/22/2013  . Cardiac device in situ 10/14/2013  . Essential hypertension 09/30/2013  . Complex regional pain syndrome type 1 of both upper extremities 09/30/2013  . Diabetes mellitus (HCC) 09/28/2013  . Hyperthyroidism 09/28/2013  . Median nerve neuritis 09/28/2013  . Ataxia, late effect of cerebrovascular disease 09/28/2013  . Spastic hemiplegia affecting dominant side (HCC) 09/28/2013  . CVA (cerebral infarction) 08/15/2013  . Cerebral embolism with cerebral infarction (HCC) 08/09/2013  . Dysarthria 08/09/2013  . Abnormality of gait 05/01/2012  . Incontinence of urine 05/01/2012  . Tobacco abuse 01/28/2012  . Tobacco dependence syndrome 01/28/2012  . Fatigue 01/01/2011  . Weakness generalized 11/03/2010  . Plantar fasciitis 09/01/2010  . Allergic rhinitis 07/11/2010  . Persistent cough 06/16/2010  . Asthma night-time symptoms 06/14/2010  . GERD (gastroesophageal reflux disease) 05/26/2010  . Stroke (HCC) 05/26/2010  . Hyperlipidemia 05/26/2010  . Osteoarthritis 05/26/2010  . Mononeuritis 05/26/2010    Past Surgical History:  Procedure Laterality Date  . CESAREAN  SECTION     x3  . FINGER SURGERY    . LOOP RECORDER IMPLANT  08-12-2013   MDT LINQ implanted by Dr Graciela Husbands for cryptogenic stroke  . LOOP RECORDER IMPLANT N/A 08/12/2013   Procedure: LOOP RECORDER IMPLANT;  Surgeon: Duke Salvia, MD;  Location: [facility] CATH LAB;  Service: Cardiovascular;  Laterality: N/A;  . TEE WITHOUT CARDIOVERSION N/A 08/12/2013   Procedure: TRANSESOPHAGEAL ECHOCARDIOGRAM (TEE);  Surgeon: Wendall Stade, MD;  Location: [facility] ENDOSCOPY;  Service: Cardiovascular;  Laterality: N/A;    OB History    No data available       Home Medications    Prior to Admission medications   Medication Sig Start Date End Date Taking? Authorizing Provider  acetaminophen (TYLENOL) 500 MG tablet Take 2 tablets (1,000 mg total) by mouth every 6 (six) hours as needed. 11/30/15  Yes Nche, Bonna Gains, NP  albuterol (PROVENTIL HFA;VENTOLIN HFA) 108 (90 Base) MCG/ACT inhaler Inhale 2 puffs into the lungs every 6 (six) hours as needed for wheezing or shortness of breath.   Yes [provider]  atorvastatin (LIPITOR) 40 MG tablet TAKE 1 TABLET(40 MG) BY MOUTH DAILY AT 6 PM 11/26/16  Yes Nche, Bonna Gains, NP  clopidogrel (PLAVIX) 75 MG tablet Take 1 tablet (75 mg total) by mouth daily. Patient overdue for an appointment. Must call and schedule for further refills 3rd/final attempt 02/18/17  Yes Janetta Hora, PA-C  diclofenac sodium (VOLTAREN) 1 % GEL Apply 2 g topically 4 (four) times daily. 04/26/16  Yes Kirsteins, Victorino Sparrow, MD  DULoxetine (CYMBALTA) 60 MG capsule Take 1 capsule (60 mg total) by mouth daily. 06/05/16  Yes [name], MD  gabapentin (NEURONTIN) 300 MG capsule Take 3 capsules (900 mg total) by mouth 3 (three) times daily. 09/11/16  Yes [name], MD  losartan-hydrochlorothiazide (HYZAAR) 100-25 MG tablet Take 1 tablet by mouth daily. 11/26/16  Yes Nche, Bonna Gains, NP  metFORMIN (GLUCOPHAGE) 500 MG tablet TAKE 1 TABLET(500 MG) BY MOUTH DAILY WITH BREAKFAST 11/26/16  Yes Nche, Bonna Gains, NP  Multiple Vitamins-Calcium (ONE-A-DAY WOMENS FORMULA PO) Take 1 tablet by mouth daily.   Yes [provider]  omeprazole (PRILOSEC) 20 MG capsule Take 20 mg by mouth daily. 02/06/17  Yes [provider]  zolpidem (AMBIEN) 5 MG tablet Take 1 tablet (5 mg total) by mouth at bedtime as needed for sleep. 12/10/14  Yes Carmelina Dane, MD  Glucose Blood (BLOOD GLUCOSE TEST  STRIPS) STRP Inject 1 Units into the skin 4 (four) times daily -  before meals and at bedtime. OneTouch Ultra mini strips Patient taking differently: Inject 1 Units into the skin 4 (four) times daily -  before meals and at bedtime. OneTouch Ultra mini strips 02/29/16   Nche, Bonna Gains, NP  ONETOUCH DELICA LANCETS FINE MISC 1 Units by Does not apply route 4 (four) times daily -  before meals and at bedtime. Patient taking differently: 1 Units by Does not apply route 4 (four) times daily -  before meals and at bedtime.  02/29/16   Nche, Bonna Gains, NP    Family History Family History  Problem Relation Age of Onset  . Cancer Father   . Diabetes Sister   . Diabetes Brother     Social History Social History   Tobacco Use  . Smoking status: Light Tobacco Smoker    Packs/day: 0.20    Years: 15.00    Pack years: 3.00    Types: Cigarettes  .  Smokeless tobacco: Never Used  . Tobacco comment:  smoking 3-4 a day  Substance Use Topics  . Alcohol use: No    Alcohol/week: 0.0 oz  . Drug use: No     Allergies   Aspirin; Diclofenac; and Penicillins   Review of Systems Review of Systems  All other systems reviewed and are negative.    Physical Exam Updated Vital Signs BP 138/67 (BP Location: Left Arm)   Pulse 74   Temp 98 F (36.7 C) (Oral)   Resp 16   SpO2 97%   Physical Exam  Constitutional: She is oriented to person, place, and time. She appears well-developed and well-nourished.  nad  HENT:  Head: Normocephalic and atraumatic.  Vision grossly normal  Eyes: Conjunctivae are normal.  Neck: Neck supple.  Cardiovascular: Normal rate and regular rhythm.  Pulmonary/Chest: Effort normal and breath sounds normal.  Abdominal: Soft. Bowel sounds are normal.  Musculoskeletal: Normal range of motion.  Neurological: She is alert and oriented to person, place, and time.  Skin: Skin is warm and dry.  Psychiatric: She has a normal mood and affect. Her behavior is normal.    Nursing note and vitals reviewed.    ED Treatments / Results  Labs (all labs ordered are listed, but only abnormal results are displayed) Labs Reviewed  COMPREHENSIVE METABOLIC PANEL - Abnormal; Notable for the following components:      Result Value   Chloride 100 (*)    All other components within normal limits  CBC WITH DIFFERENTIAL/PLATELET  URINALYSIS, ROUTINE W REFLEX MICROSCOPIC    EKG  EKG Interpretation  Date/Time:  Friday February 22 2017 22:50:50 EST Ventricular Rate:  75 PR Interval:    QRS Duration: 81 QT Interval:  389 QTC Calculation: 435 R Axis:   56 Text Interpretation:  Sinus rhythm Right atrial enlargement Confirmed by Donnetta Hutching ([id number]) on 02/22/2017 10:59:50 PM       Radiology Ct Head Wo Contrast  Result Date: 02/22/2017 CLINICAL DATA:  Altered level of consciousness  past hx cva EXAM: CT HEAD WITHOUT CONTRAST TECHNIQUE: Contiguous axial images were obtained from the base of the skull through the vertex without intravenous contrast. COMPARISON:  Brain MRI 08/10/2013 FINDINGS: Brain: Remote LEFT parietal infarction. No acute intracranial hemorrhage. No focal mass lesion. No CT evidence of acute infarction. No midline shift or mass effect. No hydrocephalus. Basilar cisterns are patent. Vascular: No hyperdense vessel or unexpected calcification. Skull: Normal. Negative for fracture or focal lesion. Sinuses/Orbits: Paranasal sinuses and mastoid air cells are clear. Orbits are clear. Other: None. IMPRESSION: 1. No acute intracranial findings.  No change from prior. 2. Remote LEFT parietal infarction. Electronically Signed   By: Genevive Bi M.D.   On: 02/22/2017 19:32    Procedures Procedures (including critical care time)  Medications Ordered in ED Medications - No data to display   Initial Impression / Assessment and Plan / ED Course  I have reviewed the triage vital signs and the nursing notes.  Pertinent labs & imaging results that were  available during my care of the patient were reviewed by me and considered in my medical decision making (see chart for details).     Patient presents with a concern about a syncopal spell a week ago.  Workup in the ED including labs, EKG, CT head showed no acute findings.  She is grossly neurologically intact.  He can follow-up with primary care.  Final Clinical Impressions(s) / ED Diagnoses   Final diagnoses:  Syncope, unspecified  syncope type    ED Discharge Orders    None       Donnetta Hutchingook, Resean Brander, MD 02/23/17 [phone]

## 2017-02-22 NOTE — Discharge Instructions (Signed)
Tests showed no life-threatening condition.  Increase fluids.  Follow-up your primary care doctor. °

## 2017-02-22 NOTE — ED Triage Notes (Signed)
Pt dent from PCP with concern for a stroke. She has a history of 2 strokes. Today she reports that she fell and loss consciousness on 1/12 (which is why she went to her PCP today.) She is complaining of bilateral hand pain, L arm pain, some dizziness and a throbbing headache around her L temple. She also endorses some blurry vision in both eyes. She states that all of her symptoms started after that fall on 1/12. Pt is stuttering with some speech, but she states that is remnant from her previous strokes. She is ambulatory with a steady gait. Equal strength in both hands.  A&Ox4.

## 2017-02-22 NOTE — Progress Notes (Signed)
Name: Kara Mendoza   MRN: [id number]    DOB: 1950/08/31   Date:02/22/2017       Progress Note  Subjective  Chief Complaint  Chief Complaint  Patient presents with  . Establish Care    had an episode of what she thinks was passing out on 02/16/17 and left the hospital before being seen,     HPI  Kara Mendoza presents today to establish care. She is coming in for an ER follow up visit. She was taken to the ER via ambulance for a syncopal episode on 02/16/17. She actually left prior to being seen by the ED provider due to a long wait time.   She tells me today that on 1/12, she had some diarrhea and became dizzy and fell. She felt like "everything went black" and she may have lost consciousness. She had a life alert bracelet that she used to call the ambulance and was taken to the ED. She was sent to the waiting room and she got tired of waiting to be called back so her daughter came and picked her up.  She is feeling progressively weaker since the 12th. She says the left side of her body is weaker than the right and she feels "off balance." She has also had a persistent left sided headache and feels like she "cant think straight." she reports an episode where she lost her vision the day after the fall. She has had left upper arm pain and bruising since, she thinks she hit her arm during the fall. She denies any fevers, chest pain, shortness of breath, nausea, vomiting. She has not lost consciousness or fallen since 1/12. She lives at home alone. She has a history of stroke in 2007 with some residual weakness, decreased vision, and speech difficulty since but is able to live at home independently and ambulates usually with a cane.    Patient Active Problem List   Diagnosis Date Noted  . Residual cognitive deficit as late effect of stroke 04/26/2016  . Hemiparesis affecting left side as late effect of cerebrovascular accident (CVA) (HCC) 04/26/2016  . Carpal tunnel syndrome on left 11/29/2015  . DDD  (degenerative disc disease), cervical 11/29/2015  . Depression 11/29/2015  . Chronic pain of left knee 09/04/2015  . Headache 09/04/2015  . Hypercalcemia 08/06/2015  . Pain in right upper arm 08/06/2015  . Paroxysmal atrial fibrillation (HCC) 07/22/2015  . History of loop recorder 04/11/2015  . History of stroke 03/17/2015  . Shortness of breath 06/23/2014  . Complex regional pain syndrome type 2 of upper extremity 11/05/2013  . Carpal tunnel syndrome on right 10/22/2013  . Chronic embolism and thrombosis of deep vein of left upper extremity (HCC) 10/22/2013  . Left arm pain 10/22/2013  . Cardiac device in situ 10/14/2013  . Essential hypertension 09/30/2013  . Complex regional pain syndrome type 1 of both upper extremities 09/30/2013  . Diabetes mellitus (HCC) 09/28/2013  . Hyperthyroidism 09/28/2013  . Median nerve neuritis 09/28/2013  . Ataxia, late effect of cerebrovascular disease 09/28/2013  . Spastic hemiplegia affecting dominant side (HCC) 09/28/2013  . CVA (cerebral infarction) 08/15/2013  . Cerebral embolism with cerebral infarction (HCC) 08/09/2013  . Dysarthria 08/09/2013  . Abnormality of gait 05/01/2012  . Incontinence of urine 05/01/2012  . Tobacco abuse 01/28/2012  . Tobacco dependence syndrome 01/28/2012  . Fatigue 01/01/2011  . Weakness generalized 11/03/2010  . Plantar fasciitis 09/01/2010  . Allergic rhinitis 07/11/2010  . Persistent cough 06/16/2010  .  Asthma night-time symptoms 06/14/2010  . GERD (gastroesophageal reflux disease) 05/26/2010  . Stroke (HCC) 05/26/2010  . Hyperlipidemia 05/26/2010  . Osteoarthritis 05/26/2010  . Mononeuritis 05/26/2010    Social History   Tobacco Use  . Smoking status: Light Tobacco Smoker    Packs/day: 0.20    Years: 15.00    Pack years: 3.00    Types: Cigarettes  . Smokeless tobacco: Never Used  . Tobacco comment:  smoking 3-4 a day  Substance Use Topics  . Alcohol use: No    Alcohol/week: 0.0 oz      Current Outpatient Medications:  .  acetaminophen (TYLENOL) 500 MG tablet, Take 2 tablets (1,000 mg total) by mouth every 6 (six) hours as needed., Disp: 30 tablet, Rfl: 0 .  albuterol (PROVENTIL HFA;VENTOLIN HFA) 108 (90 Base) MCG/ACT inhaler, Inhale 2 puffs into the lungs every 6 (six) hours as needed for wheezing or shortness of breath., Disp: , Rfl:  .  atorvastatin (LIPITOR) 40 MG tablet, TAKE 1 TABLET(40 MG) BY MOUTH DAILY AT 6 PM, Disp: 90 tablet, Rfl: 1 .  clopidogrel (PLAVIX) 75 MG tablet, Take 1 tablet (75 mg total) by mouth daily. Patient overdue for an appointment. Must call and schedule for further refills 3rd/final attempt, Disp: 15 tablet, Rfl: 0 .  diclofenac sodium (VOLTAREN) 1 % GEL, Apply 2 g topically 4 (four) times daily., Disp: 3 Tube, Rfl: 5 .  DULoxetine (CYMBALTA) 60 MG capsule, Take 1 capsule (60 mg total) by mouth daily., Disp: 30 capsule, Rfl: 2 .  gabapentin (NEURONTIN) 300 MG capsule, Take 3 capsules (900 mg total) by mouth 3 (three) times daily., Disp: 180 capsule, Rfl: 2 .  Glucose Blood (BLOOD GLUCOSE TEST STRIPS) STRP, Inject 1 Units into the skin 4 (four) times daily -  before meals and at bedtime. OneTouch Ultra mini strips (Patient taking differently: Inject 1 Units into the skin 4 (four) times daily -  before meals and at bedtime. OneTouch Ultra mini strips), Disp: 100 each, Rfl: 3 .  losartan-hydrochlorothiazide (HYZAAR) 100-25 MG tablet, Take 1 tablet by mouth daily., Disp: 90 tablet, Rfl: 1 .  metFORMIN (GLUCOPHAGE) 500 MG tablet, TAKE 1 TABLET(500 MG) BY MOUTH DAILY WITH BREAKFAST, Disp: 90 tablet, Rfl: 1 .  Multiple Vitamins-Calcium (ONE-A-DAY WOMENS FORMULA PO), Take 1 tablet by mouth daily., Disp: , Rfl:  .  ONETOUCH DELICA LANCETS FINE MISC, 1 Units by Does not apply route 4 (four) times daily -  before meals and at bedtime. (Patient taking differently: 1 Units by Does not apply route 4 (four) times daily -  before meals and at bedtime. ), Disp: 100  each, Rfl: 3 .  zolpidem (AMBIEN) 5 MG tablet, Take 1 tablet (5 mg total) by mouth at bedtime as needed for sleep., Disp: 30 tablet, Rfl: 2  Allergies  Allergen Reactions  . Aspirin Hives, Itching and Other (See Comments)  . Diclofenac Shortness Of Breath    Only with po diclofenac  . Penicillins Hives    Has patient had a PCN reaction causing immediate rash, facial/tongue/throat swelling, SOB or lightheadedness with hypotension: Yes Has patient had a PCN reaction causing severe rash involving mucus membranes or skin necrosis: No Has patient had a PCN reaction that required hospitalization No Has patient had a PCN reaction occurring within the last 10 years: No If all of the above answers are "NO", then may proceed with Cephalosporin use.     ROS  No other specific complaints in a complete review of  systems (except as listed in HPI above).  Objective  Vitals:   02/22/17 1416  BP: (!) 92/52  Pulse: 99  Resp: 16  Temp: 99.1 F (37.3 C)  TempSrc: Oral  SpO2: 97%  Weight: 169 lb 6.4 oz (76.8 kg)  Height: 5\' 2"  (1.575 m)   Body mass index is 30.98 kg/m.  Nursing Note and Vital Signs reviewed.  Physical Exam Constitutional: Patient appears well-developed and well-nourished. HEENT: Head normocephalic, pupils equal and reactive to light, EOM's intact, neck supple without lymphadenopathy, oropharynx pink and moist without exudate. Neck: Normal range of motion. Neck supple. No JVD present. Cardiovascular: Normal rate, regular rhythm, S1/S2 present.  No murmur or rub heard. No BLE edema. Pulmonary/Chest: Effort normal and breath sounds clear. No respiratory distress or retractions. Abdominal: Soft and non-tender, bowel sounds present x4 quadrants. Neurological: She is alert and oriented to person, place and time. Facial asymmetry. Stuttering speech. Grips are weak bilaterally. Usteady gait.  Skin: Warm and dry. Bruising noted to left upper anterior forearm Musculoskeletal: No  obvious deformity. Decreased ROM to left arm. Psychiatric: Patient has a normal mood and affect. behavior is normal. Judgment and thought content normal. Psychiatric: Patient has a normal mood and affect. behavior is normal. Judgment and thought content normal.  Assessment & Plan  Weakness Her weakness is progressively worse over the past week. She has had vision loss and feeling off balance since her syncopal episode on 1/12. I am concerned for dehydration, stroke or other serious cause. I advised her to go immediately to the ER For further evaluation and she is agreeable. I called ER charge nurse at [facility] and gave verbal report.  Pt is transferring now by private vehicle she called for transport. - EKG 12-Lead- I reviewed the patients EKG today; sinus rhythm without acute abnormalities

## 2017-03-07 ENCOUNTER — Encounter: Payer: Self-pay | Admitting: Nurse Practitioner

## 2017-03-07 ENCOUNTER — Ambulatory Visit (INDEPENDENT_AMBULATORY_CARE_PROVIDER_SITE_OTHER): Payer: 59 | Admitting: Nurse Practitioner

## 2017-03-07 ENCOUNTER — Telehealth: Payer: Self-pay | Admitting: Nurse Practitioner

## 2017-03-07 ENCOUNTER — Other Ambulatory Visit (INDEPENDENT_AMBULATORY_CARE_PROVIDER_SITE_OTHER): Payer: 59

## 2017-03-07 VITALS — BP 122/74 | HR 97 | Temp 98.4°F | Resp 16 | Ht 62.0 in | Wt 171.4 lb

## 2017-03-07 DIAGNOSIS — R519 Headache, unspecified: Secondary | ICD-10-CM

## 2017-03-07 DIAGNOSIS — E1142 Type 2 diabetes mellitus with diabetic polyneuropathy: Secondary | ICD-10-CM

## 2017-03-07 DIAGNOSIS — R32 Unspecified urinary incontinence: Secondary | ICD-10-CM | POA: Diagnosis not present

## 2017-03-07 DIAGNOSIS — R51 Headache: Secondary | ICD-10-CM | POA: Diagnosis not present

## 2017-03-07 DIAGNOSIS — R55 Syncope and collapse: Secondary | ICD-10-CM | POA: Diagnosis not present

## 2017-03-07 DIAGNOSIS — E785 Hyperlipidemia, unspecified: Secondary | ICD-10-CM

## 2017-03-07 DIAGNOSIS — K219 Gastro-esophageal reflux disease without esophagitis: Secondary | ICD-10-CM

## 2017-03-07 LAB — LIPID PANEL
Cholesterol: 169 mg/dL (ref 0–200)
HDL: 47.7 mg/dL (ref >39.00)
LDL Cholesterol: 101 mg/dL — ABNORMAL HIGH (ref 0–99)
NonHDL: 121.37
Total CHOL/HDL Ratio: 4
Triglycerides: 100 mg/dL (ref 0.0–149.0)
VLDL: 20 mg/dL (ref 0.0–40.0)

## 2017-03-07 LAB — SEDIMENTATION RATE: Sed Rate: 11 mm/hr (ref 0–30)

## 2017-03-07 LAB — HEMOGLOBIN A1C: Hgb A1c MFr Bld: 6.2 % (ref 4.6–6.5)

## 2017-03-07 NOTE — Assessment & Plan Note (Signed)
Last lipid panel with elevated LDL cholesterol We will continue atorvastatin at current dosage and recheck lipid panel - Lipid panel; Future

## 2017-03-07 NOTE — Telephone Encounter (Signed)
Can we find out if she needed me to place referral to hand specialist? Her nurse said they already were already making an appointment, but then I see you wrote needs referral in the CC? I just want to make sure. Thanks

## 2017-03-07 NOTE — Assessment & Plan Note (Signed)
Stable, continue prilosec.  °

## 2017-03-07 NOTE — Patient Instructions (Addendum)
.  Please head downstairs for lab work.  You may try tylenol for your headaches. I have sent a referral to your neurologist to see if they can see you for an appointment for your headaches.  Please call your cardiologist for refill of your plavix.  It was nice to see you today. Thanks for letting me take care of you today :)   General Headache Without Cause A headache is pain or discomfort felt around the head or neck area. There are many causes and types of headaches. In some cases, the cause may not be found. Follow these instructions at home: Managing pain  Take over-the-counter and prescription medicines only as told by your doctor.  Lie down in a dark, quiet room when you have a headache.  If directed, apply ice to the head and neck area: ? Put ice in a plastic bag. ? Place a towel between your skin and the bag. ? Leave the ice on for 20 minutes, 2-3 times per day.  Use a heating pad or hot shower to apply heat to the head and neck area as told by your doctor.  Keep lights dim if bright lights bother you or make your headaches worse. Eating and drinking  Eat meals on a regular schedule.  Lessen how much alcohol you drink.  Lessen how much caffeine you drink, or stop drinking caffeine. General instructions  Keep all follow-up visits as told by your doctor. This is important.  Keep a journal to find out if certain things bring on headaches. For example, write down: ? What you eat and drink. ? How much sleep you get. ? Any change to your diet or medicines.  Relax by getting a massage or doing other relaxing activities.  Lessen stress.  Sit up straight. Do not tighten (tense) your muscles.  Do not use tobacco products. This includes cigarettes, chewing tobacco, or e-cigarettes. If you need help quitting, ask your doctor.  Exercise regularly as told by your doctor.  Get enough sleep. This often means 7-9 hours of sleep. Contact a doctor if:  Your symptoms are not  helped by medicine.  You have a headache that feels different than the other headaches.  You feel sick to your stomach (nauseous) or you throw up (vomit).  You have a fever. Get help right away if:  Your headache becomes really bad.  You keep throwing up.  You have a stiff neck.  You have trouble seeing.  You have trouble speaking.  You have pain in the eye or ear.  Your muscles are weak or you lose muscle control.  You lose your balance or have trouble walking.  You feel like you will pass out (faint) or you pass out.  You have confusion. This information is not intended to replace advice given to you by your health care provider. Make sure you discuss any questions you have with your health care provider. Document Released: 11/01/2007 Document Revised: 06/30/2015 Document Reviewed: 05/17/2014 Elsevier Interactive Patient Education  [employer]2018 [employer]

## 2017-03-07 NOTE — Progress Notes (Signed)
Name: Kara Mendoza   MRN: [id number]    DOB: [birth year]   Date:03/07/2017       Progress Note  Subjective  Chief Complaint  Chief Complaint  Patient presents with  . Hospitalization Follow-up    referral to hand specialist, hand specialist on [address]. Headaches every other day    HPI  [name] is coming in today for an ED follow up. Her home health nurse accompanies her to her appointment today.  She was seen in the emergency department on 1/18 for syncope.  Her ED record was reviewed today: She had the following labs: CBC, CMET, urinalysis which were unremarkable.  She also had a CT head without contrast and an EKG without any acute findings.  She was discharged home with instructions to follow-up with her primary care provider.  Since she has been home, she denies any further episodes of syncope. she says she has been having headaches almost daily. she actually says the headaches started several months ago but have been more frequent over the past several weeks. The headaches occur on both sides of her head. The headaches resolve with time. She describes as throbbing pain. She was recently seen by her eye doctor, she was told that she needs cataract surgery but has not scheduled this yet. She denies nausea, vomiting,dizziness, falls.   Diabetes- maintained on metfomrin Reports daily medication compliance without adverse medication effects. Reports she does not check her blood sugars at home regulary Denies tremor, diaphoresis, polyuria, polydipsia, polyphagia.  Lab Results  Component Value Date   HGBA1C 6.3 11/23/2016   Cholesterol- maintained on lipitor Reports daily medication compliance without adverse medication effects including myalgias. She always takes her lipitor at night before bed.  Lab Results  Component Value Date   CHOL 240 (H) 05/01/2016   HDL 48.00 05/01/2016   LDLCALC 172 (H) 05/01/2016   TRIG 98.0 05/01/2016   CHOLHDL 5 05/01/2016    GERD-maintained on  prilosec 20 mg daily; She reports nausea and acid regurgitation on the days she misses the medication. She denies dysphagia, cough,shortness of breath, abdominal pain.  Urinary incontinence- She is requesting an order for disposable briefs for bladder leakage   Patient Active Problem List   Diagnosis Date Noted  . Residual cognitive deficit as late effect of stroke 04/26/2016  . Hemiparesis affecting left side as late effect of cerebrovascular accident (CVA) ([place]) 04/26/2016  . Carpal tunnel syndrome on left 11/29/2015  . DDD (degenerative disc disease), cervical 11/29/2015  . Depression 11/29/2015  . Chronic pain of left knee 09/04/2015  . Headache 09/04/2015  . Hypercalcemia 08/06/2015  . Pain in right upper arm 08/06/2015  . Paroxysmal atrial fibrillation ([place]) 07/22/2015  . History of loop recorder 04/11/2015  . History of stroke 03/17/2015  . Shortness of breath 06/23/2014  . Complex regional pain syndrome type 2 of upper extremity 11/05/2013  . Carpal tunnel syndrome on right 10/22/2013  . Chronic embolism and thrombosis of deep vein of left upper extremity ([place]) 10/22/2013  . Left arm pain 10/22/2013  . Cardiac device in situ 10/14/2013  . Essential hypertension 09/30/2013  . Complex regional pain syndrome type 1 of both upper extremities 09/30/2013  . Diabetes mellitus ([place]) 09/28/2013  . Hyperthyroidism 09/28/2013  . Median nerve neuritis 09/28/2013  . Ataxia, late effect of cerebrovascular disease 09/28/2013  . Spastic hemiplegia affecting dominant side ([place]) 09/28/2013  . CVA (cerebral infarction) 08/15/2013  . Cerebral embolism with cerebral infarction ([place]) 08/09/2013  .  Dysarthria 08/09/2013  . Abnormality of gait 05/01/2012  . Incontinence of urine 05/01/2012  . Tobacco abuse 01/28/2012  . Tobacco dependence syndrome 01/28/2012  . Fatigue 01/01/2011  . Weakness generalized 11/03/2010  . Plantar fasciitis 09/01/2010  . Allergic rhinitis 07/11/2010  .  Persistent cough 06/16/2010  . Asthma night-time symptoms 06/14/2010  . GERD (gastroesophageal reflux disease) 05/26/2010  . Stroke ([place]) 05/26/2010  . Hyperlipidemia 05/26/2010  . Osteoarthritis 05/26/2010  . Mononeuritis 05/26/2010    Past Surgical History:  Procedure Laterality Date  . CESAREAN SECTION     x3  . FINGER SURGERY    . LOOP RECORDER IMPLANT  08-12-2013   MDT LINQ implanted by Dr Caryl Comes for cryptogenic stroke  . LOOP RECORDER IMPLANT N/A 08/12/2013   Procedure: LOOP RECORDER IMPLANT;  Surgeon: Deboraha Sprang, MD;  Location: [facility] CATH LAB;  Service: Cardiovascular;  Laterality: N/A;  . TEE WITHOUT CARDIOVERSION N/A 08/12/2013   Procedure: TRANSESOPHAGEAL ECHOCARDIOGRAM (TEE);  Surgeon: Josue Hector, MD;  Location: [facility] ENDOSCOPY;  Service: Cardiovascular;  Laterality: N/A;    Family History  Problem Relation Age of Onset  . Cancer Father   . Diabetes Sister   . Diabetes Brother     Social History   Socioeconomic History  . Marital status: Single    Spouse name: Not on file  . Number of children: 3  . Years of education: College  . Highest education level: Not on file  Social Needs  . Financial resource strain: Not on file  . Food insecurity - worry: Not on file  . Food insecurity - inability: Not on file  . Transportation needs - medical: Not on file  . Transportation needs - non-medical: Not on file  Occupational History    Employer: OTHER    Comment: Disabled  Tobacco Use  . Smoking status: Light Tobacco Smoker    Packs/day: 0.20    Years: 15.00    Pack years: 3.00    Types: Cigarettes  . Smokeless tobacco: Never Used  . Tobacco comment:  smoking 3-4 a day  Substance and Sexual Activity  . Alcohol use: No    Alcohol/week: 0.0 oz  . Drug use: No  . Sexual activity: Not on file  Other Topics Concern  . Not on file  Social History Narrative   Patient is single with 3 children.   Patient is right handed.   Patient has college education.   Patient  drinks 2 cups daily.     Current Outpatient Medications:  .  acetaminophen (TYLENOL) 500 MG tablet, Take 2 tablets (1,000 mg total) by mouth every 6 (six) hours as needed., Disp: 30 tablet, Rfl: 0 .  albuterol (PROVENTIL HFA;VENTOLIN HFA) 108 (90 Base) MCG/ACT inhaler, Inhale 2 puffs into the lungs every 6 (six) hours as needed for wheezing or shortness of breath., Disp: , Rfl:  .  atorvastatin (LIPITOR) 40 MG tablet, TAKE 1 TABLET(40 MG) BY MOUTH DAILY AT 6 PM, Disp: 90 tablet, Rfl: 1 .  clopidogrel (PLAVIX) 75 MG tablet, Take 1 tablet (75 mg total) by mouth daily. Patient overdue for an appointment. Must call and schedule for further refills 3rd/final attempt, Disp: 15 tablet, Rfl: 0 .  diclofenac sodium (VOLTAREN) 1 % GEL, Apply 2 g topically 4 (four) times daily., Disp: 3 Tube, Rfl: 5 .  DULoxetine (CYMBALTA) 60 MG capsule, Take 1 capsule (60 mg total) by mouth daily., Disp: 30 capsule, Rfl: 2 .  gabapentin (NEURONTIN) 300 MG capsule, Take 3  capsules (900 mg total) by mouth 3 (three) times daily., Disp: 180 capsule, Rfl: 2 .  Glucose Blood (BLOOD GLUCOSE TEST STRIPS) STRP, Inject 1 Units into the skin 4 (four) times daily -  before meals and at bedtime. OneTouch Ultra mini strips (Patient taking differently: Inject 1 Units into the skin 4 (four) times daily -  before meals and at bedtime. OneTouch Ultra mini strips), Disp: 100 each, Rfl: 3 .  losartan-hydrochlorothiazide (HYZAAR) 100-25 MG tablet, Take 1 tablet by mouth daily., Disp: 90 tablet, Rfl: 1 .  metFORMIN (GLUCOPHAGE) 500 MG tablet, TAKE 1 TABLET(500 MG) BY MOUTH DAILY WITH BREAKFAST, Disp: 90 tablet, Rfl: 1 .  Multiple Vitamins-Calcium (ONE-A-DAY WOMENS FORMULA PO), Take 1 tablet by mouth daily., Disp: , Rfl:  .  omeprazole (PRILOSEC) 20 MG capsule, Take 20 mg by mouth daily., Disp: , Rfl: 0 .  ONETOUCH DELICA LANCETS FINE MISC, 1 Units by Does not apply route 4 (four) times daily -  before meals and at bedtime. (Patient taking  differently: 1 Units by Does not apply route 4 (four) times daily -  before meals and at bedtime. ), Disp: 100 each, Rfl: 3 .  zolpidem (AMBIEN) 5 MG tablet, Take 1 tablet (5 mg total) by mouth at bedtime as needed for sleep., Disp: 30 tablet, Rfl: 2  Allergies  Allergen Reactions  . Aspirin Hives, Itching and Other (See Comments)  . Diclofenac Shortness Of Breath    Only with po diclofenac  . Penicillins Hives    Has patient had a PCN reaction causing immediate rash, facial/tongue/throat swelling, SOB or lightheadedness with hypotension: Yes Has patient had a PCN reaction causing severe rash involving mucus membranes or skin necrosis: No Has patient had a PCN reaction that required hospitalization No Has patient had a PCN reaction occurring within the last 10 years: No If all of the above answers are "NO", then may proceed with Cephalosporin use.      ROS See HPI  Objective  Vitals:   03/07/17 1417  BP: 122/74  Pulse: 97  Resp: 16  Temp: 98.4 F (36.9 C)  TempSrc: Oral  SpO2: 97%  Weight: 171 lb 6.4 oz (77.7 kg)  Height: 5' 2"  (1.575 m)   Body mass index is 31.35 kg/m.  Physical Exam Vital signs reviewed Constitutional: Patient appears well-developed and well-nourished. HEENT: Head normocephalic, pupils equal and reactive to light, EOM's intact, neck supple without lymphadenopathy, oropharynx pink and moist without exudate. Neck: Normal range of motion. Neck supple. Cardiovascular: Normal rate, regular rhythm, S1/S2 present.  No murmur or rub heard. No BLE edema. Pulmonary/Chest: Effort normal and breath sounds clear. No respiratory distress or retractions. Abdominal: Soft and non-tender, bowel sounds present x4 quadrants. Neurological: She is alert and oriented to person, place and time. Facial asymmetry. Stuttering speech. Grips are weak bilaterally. Usteady gait.  Skin: Warm and dry. Bruising noted to left upper anterior forearm Musculoskeletal: No obvious  deformity. Decreased ROM to left arm. Psychiatric: Patient has a normal mood and affect. behavior is normal. Judgment and thought content normal. Psychiatric: Patient has a normal mood and affect. behavior is normal. Judgment and thought content normal.  Assessment & Plan RTC in 3 months for routine follow up  Syncope, unspecified syncope type No additional episodes of syncope since her ER visit. She will F/u with neurology   Nonintractable headache, unspecified chronicity pattern, unspecified headache type Headaches could be migraines, but will obtain ESR to rule out temporal arteritis. Referral to neurology for  headaches, instructions to try tylenol for now. She is on plavix so we will avoid antiinflammatories. Return precautions were discussed and education printed on AVS - Sed Rate (ESR); Future - Ambulatory referral to Neurology   Urinary incontinence, unspecified type - DME Other see comment

## 2017-03-07 NOTE — Assessment & Plan Note (Signed)
Stable, continue metformin - Hemoglobin A1c; Future

## 2017-03-08 ENCOUNTER — Other Ambulatory Visit: Payer: Self-pay | Admitting: Nurse Practitioner

## 2017-03-08 DIAGNOSIS — M79602 Pain in left arm: Secondary | ICD-10-CM

## 2017-03-08 NOTE — Telephone Encounter (Signed)
She does need a referral.  

## 2017-03-08 NOTE — Telephone Encounter (Signed)
Referral to orthopedics placed.

## 2017-03-13 ENCOUNTER — Other Ambulatory Visit: Payer: Self-pay

## 2017-03-13 ENCOUNTER — Telehealth: Payer: Self-pay | Admitting: Neurology

## 2017-03-13 DIAGNOSIS — G5643 Causalgia of bilateral upper limbs: Secondary | ICD-10-CM

## 2017-03-13 DIAGNOSIS — F339 Major depressive disorder, recurrent, unspecified: Secondary | ICD-10-CM

## 2017-03-13 MED ORDER — DULOXETINE HCL 60 MG PO CPEP: 60.0000 mg | ORAL_CAPSULE | Freq: Every day | ORAL | 1 refills | Status: DC

## 2017-03-13 NOTE — Telephone Encounter (Signed)
Refill done until pts appt in April with Dr. Roda ShuttersXu.

## 2017-03-13 NOTE — Telephone Encounter (Signed)
Patient requesting refill of DULoxetine (CYMBALTA) 60 MG capsule [facility] [address] I advised Dr. Roda ShuttersXu is out of the office but will send to his nurse.

## 2017-03-15 ENCOUNTER — Other Ambulatory Visit: Payer: Self-pay | Admitting: Neurology

## 2017-03-15 DIAGNOSIS — G5643 Causalgia of bilateral upper limbs: Secondary | ICD-10-CM

## 2017-03-15 DIAGNOSIS — F339 Major depressive disorder, recurrent, unspecified: Secondary | ICD-10-CM

## 2017-03-29 ENCOUNTER — Telehealth: Payer: Self-pay | Admitting: Nurse Practitioner
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
## 2017-03-29 NOTE — Telephone Encounter (Signed)
I have received FMLA for Kara Mendoza which is the patients daughter.  As you aware of them requesting FMLA?   I called and left a message for [place].  I need to know what she is wanting the FMLA for? &What kind of time is she requesting?

## 2017-03-29 NOTE — Telephone Encounter (Signed)
Kara Mendoza please advise. If you would like them to set up an appointment, let me know. Thank you.

## 2017-04-01 NOTE — Telephone Encounter (Signed)
Kara Mendoza, She does not need to come in for appointment, I will be happy to give her daughter FMLA- will 3-4 days/month for up to 8 hours a day be enough?

## 2017-04-02 ENCOUNTER — Telehealth: Payer: Self-pay | Admitting: Nurse Practitioner

## 2017-04-02 NOTE — Telephone Encounter (Signed)
Thank you. Let me know if I need to do anything else.

## 2017-04-02 NOTE — Telephone Encounter (Signed)
[facility],Pt asked for the activstyle fax to be refaxed from December, fax printed from media and faxed to fax number on form. Original was never recievd.

## 2017-04-02 NOTE — Telephone Encounter (Signed)
Forms completed & placed in Sham[place]'s box to review &sign.

## 2017-04-02 NOTE — Telephone Encounter (Signed)
Spoke with daughter.  She is fine with this. I will start working on it.   She wants it faxed to employer [phone] & Original mailed to her [address] [place], [place] [id number]

## 2017-04-03 NOTE — Telephone Encounter (Signed)
done

## 2017-04-03 NOTE — Telephone Encounter (Signed)
Everything has been completed as noted below, Copy sent to scan &Charged for under the daughter.

## 2017-04-22 ENCOUNTER — Other Ambulatory Visit: Payer: Self-pay | Admitting: Physician Assistant

## 2017-04-22 ENCOUNTER — Other Ambulatory Visit: Payer: Self-pay | Admitting: Nurse Practitioner

## 2017-04-22 DIAGNOSIS — M25562 Pain in left knee: Secondary | ICD-10-CM

## 2017-04-22 DIAGNOSIS — R471 Dysarthria and anarthria: Secondary | ICD-10-CM

## 2017-04-22 DIAGNOSIS — G5640 Causalgia of unspecified upper limb: Secondary | ICD-10-CM

## 2017-04-22 DIAGNOSIS — G8929 Other chronic pain: Secondary | ICD-10-CM

## 2017-04-22 DIAGNOSIS — G5612 Other lesions of median nerve, left upper limb: Secondary | ICD-10-CM

## 2017-04-22 DIAGNOSIS — I10 Essential (primary) hypertension: Secondary | ICD-10-CM

## 2017-04-22 DIAGNOSIS — K219 Gastro-esophageal reflux disease without esophagitis: Secondary | ICD-10-CM

## 2017-04-24 ENCOUNTER — Other Ambulatory Visit: Payer: Self-pay | Admitting: Physician Assistant

## 2017-05-06 ENCOUNTER — Ambulatory Visit: Payer: 59 | Admitting: Neurology

## 2017-05-07 ENCOUNTER — Telehealth: Payer: Self-pay

## 2017-05-07 NOTE — Telephone Encounter (Signed)
Patient no show for appt today 05/06/2017.

## 2017-05-08 ENCOUNTER — Other Ambulatory Visit: Payer: Self-pay | Admitting: Physician Assistant

## 2017-05-09 ENCOUNTER — Other Ambulatory Visit: Payer: Self-pay | Admitting: Physician Assistant

## 2017-06-06 ENCOUNTER — Ambulatory Visit: Payer: 59 | Admitting: Nurse Practitioner

## 2017-06-10 ENCOUNTER — Ambulatory Visit: Payer: 59 | Admitting: Neurology

## 2017-06-11 ENCOUNTER — Encounter: Payer: Self-pay | Admitting: Neurology

## 2017-06-12 ENCOUNTER — Telehealth: Payer: Self-pay

## 2017-06-12 ENCOUNTER — Other Ambulatory Visit: Payer: Self-pay | Admitting: Physician Assistant

## 2017-06-12 NOTE — Telephone Encounter (Signed)
Patient no show for appt on 06/10/2017. 

## 2017-06-17 ENCOUNTER — Ambulatory Visit: Payer: 59 | Admitting: Neurology

## 2017-06-19 ENCOUNTER — Ambulatory Visit (INDEPENDENT_AMBULATORY_CARE_PROVIDER_SITE_OTHER): Payer: 59 | Admitting: Nurse Practitioner

## 2017-06-19 ENCOUNTER — Encounter: Payer: Self-pay | Admitting: Nurse Practitioner

## 2017-06-19 VITALS — BP 138/72 | HR 90 | Temp 98.3°F | Resp 16 | Ht 62.0 in | Wt 165.0 lb

## 2017-06-19 DIAGNOSIS — F339 Major depressive disorder, recurrent, unspecified: Secondary | ICD-10-CM

## 2017-06-19 DIAGNOSIS — M79643 Pain in unspecified hand: Secondary | ICD-10-CM | POA: Diagnosis not present

## 2017-06-19 DIAGNOSIS — R21 Rash and other nonspecific skin eruption: Secondary | ICD-10-CM

## 2017-06-19 DIAGNOSIS — I10 Essential (primary) hypertension: Secondary | ICD-10-CM | POA: Diagnosis not present

## 2017-06-19 DIAGNOSIS — I639 Cerebral infarction, unspecified: Secondary | ICD-10-CM

## 2017-06-19 DIAGNOSIS — G5643 Causalgia of bilateral upper limbs: Secondary | ICD-10-CM | POA: Diagnosis not present

## 2017-06-19 MED ORDER — DULOXETINE HCL 60 MG PO CPEP: 60.0000 mg | ORAL_CAPSULE | Freq: Every day | ORAL | 1 refills | Status: DC

## 2017-06-19 MED ORDER — DICLOFENAC SODIUM 1 % TD GEL: 2.0000 g | Freq: Four times a day (QID) | TRANSDERMAL | 0 refills | Status: DC

## 2017-06-19 MED ORDER — CLOPIDOGREL BISULFATE 75 MG PO TABS: 75.0000 mg | ORAL_TABLET | Freq: Every day | ORAL | 1 refills | Status: DC

## 2017-06-19 MED ORDER — LOSARTAN POTASSIUM-HCTZ 100-25 MG PO TABS: 1.0000 | ORAL_TABLET | Freq: Every day | ORAL | 1 refills | Status: DC

## 2017-06-19 NOTE — Assessment & Plan Note (Signed)
Stable, continue current medications - losartan-hydrochlorothiazide (HYZAAR) 100-25 MG tablet; Take 1 tablet by mouth daily.  Dispense: 90 tablet; Refill: 1
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
## 2017-06-19 NOTE — Assessment & Plan Note (Signed)
Continue cymbalta, neurontin Referral to [facility] neurology today for further evaluation of hand pain - DULoxetine (CYMBALTA) 60 MG capsule; Take 1 capsule (60 mg total) by mouth daily.  Dispense: 30 capsule; Refill: 1

## 2017-06-19 NOTE — Assessment & Plan Note (Signed)
Stable, continue current medications F/u for new, worsening symptoms - DULoxetine (CYMBALTA) 60 MG capsule; Take 1 capsule (60 mg total) by mouth daily.  Dispense: 30 capsule; Refill: 1

## 2017-06-19 NOTE — Patient Instructions (Signed)
I have sent medication refills as requested. Please keep your neurology follow up tomorrow.  I have placed a referral to [facility] to further evaluate your hand pain. Our office will call you to schedule this appointment. You should hear from our office in 7-10 days.  Please return to see me in about 3 months for follow up.  You may try taking 1/2 of a benadryl tablet before bedtime to see if this helps your rash. Please follow up if rash continues.  Drug Rash A drug rash is a change in the color or texture of the skin that is caused by a drug. It can develop minutes, hours, or days after the person takes the drug. What are the causes? This condition is usually caused by a drug allergy. It can also be caused by exposure to sunlight after taking a drug that makes the skin sensitive to light. Drugs that commonly cause rashes include:  Penicillin.  Antibiotic medicines.  Medicines that treat seizures.  Medicines that treat cancer (chemotherapy).  Aspirin and other nonsteroidal anti-inflammatory drugs (NSAIDs).  Injectable dyes that contain iodine.  Insulin.  What are the signs or symptoms? Symptoms of this condition include:  Redness.  Tiny bumps.  Peeling.  Itching.  Itchy welts (hives).  Swelling.  The rash may appear on a small area of skin or all over the body. How is this diagnosed? To diagnose the condition, your health care provider will do a physical exam. He or she may also order tests to find out which drug caused the rash. Tests to find the cause of a rash include:  Skin tests.  Blood tests.  Drug challenge. For this test, you stop taking all of the drugs that you do not need to take, and then you start taking them again by adding back one of the drugs at a time.  How is this treated? A drug rash may be treated with medicines, including:  Antihistamines. These may be given to relieve itching.  An NSAID. This may be given to reduce swelling and  treat pain.  A steroid drug. This may be given to reduce swelling.  The rash usually goes away when the person stops taking the drug that caused it. Follow these instructions at home:  Take medicines only as directed by your health care provider.  Let all of your health care providers know about any drug reactions you have had in the past.  If you have hives, take a cool shower or use a cool compress to relieve itchiness. Contact a health care provider if:  You have a fever.  Your rash is not going away.  Your rash gets worse.  Your rash comes back.  You have wheezing or coughing. Get help right away if:  You start to have breathing problems.  You start to have shortness of breath.  You face or throat starts to swell.  You have severe weakness with dizziness or fainting.  You have chest pain. This information is not intended to replace advice given to you by your health care provider. Make sure you discuss any questions you have with your health care provider. Document Released: 03/01/2004 Document Revised: 06/30/2015 Document Reviewed: 11/18/2013 Elsevier Interactive Patient Education  [employer].

## 2017-06-19 NOTE — Assessment & Plan Note (Signed)
Continue plavix, statin, asa F/U with neurology as instructed Referral placed to [facility] for further evaluation today - clopidogrel (PLAVIX) 75 MG tablet; Take 1 tablet (75 mg total) by mouth daily. Patient overdue for an appointment. Must call and schedule for further refills 3rd/final attempt  Dispense: 30 tablet; Refill: 1 - Ambulatory referral to Neurology

## 2017-06-19 NOTE — Progress Notes (Signed)
Name: Kara Mendoza   MRN: [id number]    DOB: [birth year]   Date:06/19/2017       Progress Note  Subjective  Chief Complaint  Chief Complaint  Patient presents with  . Follow-up    plavix, voltaren gel, cymbalta, losartan, [facility]erral [facility]    HPI [name] is here today requesting medication refills and a referral. She would like for me to fill her plavix, cymbalta and voltaren gel which have been prescribed by specialty providers but she has not been to her recent follow ups with other providers. She does have a visit with her neurologist scheduled for tomorrow. She is also requesting a refill on losartan, a referral and would like to discuss a rash. She is accompanied by her daughter today  She has been maintained on plavix and atorvastatin for secondary stroke prevention s/p left MCA infarct in 2008. She has been maintained on cymbalta for some time for CRPS, depression. She reports taking her plavix, statin and cymbalta daily as prescribed without any noted adverse effects including weakness, fatigue, abnormal bruising or bleeding, nausea. She denies thoughts of hurting herself or others. She has also been using voltaren gel prn for hand pain which does provide some relief for her. She tells me that neurology referred her to a specialist for worsening bilateral hand pain and it was recommended to her to have "experimental nerve blocks." she is very worried about going forward with this procedure due high risk for adverse outcomes and fear she will lose use of her hands. She says that neurology told her they would recommend [facility] and asks me to place a referral today. She describes a constant, burning stabbing pain to both of her hands that has been increasingly worse and uncontrolled since her stroke.  Hypertension -maintained on hyzaar 100-25 daily Reports daily medication compliance without noted adverse medication  effects. Does not report blood pressure readings today Denies headaches, vision changes, chest pain, shortness of breath, edema.  BP Readings from Last 3 Encounters:  06/19/17 138/72  03/07/17 122/74  02/22/17 138/67   Rash- This is a new problem She reports that twice over the past two weeks she has noticed red rash to her chest and itching to her legs, both times after taking different vitamin supplements for hair and nails She applied neosporin to the rash both times which seemed to help. The rash and itching eventually resolved over time. She denies oral swelling, difficulty breathing or swallowing, nausea or vomiting.   Patient Active Problem List   Diagnosis Date Noted  . Residual cognitive deficit as late effect of stroke 04/26/2016  . Hemiparesis affecting left side as late effect of cerebrovascular accident (CVA) (HCC) 04/26/2016  . Carpal tunnel syndrome on left 11/29/2015  . DDD (degenerative disc disease), cervical 11/29/2015  . Depression 11/29/2015  . Chronic pain of left knee 09/04/2015  . Headache 09/04/2015  . Paroxysmal atrial fibrillation (HCC) 07/22/2015  . Complex regional pain syndrome type 2 of upper extremity 11/05/2013  . Carpal tunnel syndrome on right 10/22/2013  . Chronic embolism and thrombosis of deep vein of left upper extremity (HCC) 10/22/2013  . Cardiac device in situ 10/14/2013  . Essential hypertension 09/30/2013  . Complex regional pain syndrome type 1 of both upper extremities 09/30/2013  . Diabetes mellitus (HCC) 09/28/2013  . Hyperthyroidism 09/28/2013  . Median nerve neuritis 09/28/2013  . Ataxia, late effect of cerebrovascular disease 09/28/2013  .  Spastic hemiplegia affecting dominant side (HCC) 09/28/2013  . CVA (cerebral infarction) 08/15/2013  . Cerebral embolism with cerebral infarction (HCC) 08/09/2013  . Dysarthria 08/09/2013  . Abnormality of gait 05/01/2012  . Incontinence of urine 05/01/2012  . Tobacco abuse 01/28/2012  .  Tobacco dependence syndrome 01/28/2012  . Plantar fasciitis 09/01/2010  . Allergic rhinitis 07/11/2010  . Persistent cough 06/16/2010  . Asthma night-time symptoms 06/14/2010  . GERD (gastroesophageal reflux disease) 05/26/2010  . Stroke (HCC) 05/26/2010  . Hyperlipidemia 05/26/2010  . Osteoarthritis 05/26/2010  . Mononeuritis 05/26/2010    Past Surgical History:  Procedure Laterality Date  . CESAREAN SECTION     x3  . FINGER SURGERY    . LOOP RECORDER IMPLANT  08-12-2013   MDT LINQ implanted by Dr Graciela Husbands for cryptogenic stroke  . LOOP RECORDER IMPLANT N/A 08/12/2013   Procedure: LOOP RECORDER IMPLANT;  Surgeon: [facility] Salvia, MD;  Location: [facility] CATH LAB;  Service: Cardiovascular;  Laterality: N/A;  . TEE WITHOUT CARDIOVERSION N/A 08/12/2013   Procedure: TRANSESOPHAGEAL ECHOCARDIOGRAM (TEE);  Surgeon: Wendall Stade, MD;  Location: [facility] ENDOSCOPY;  Service: Cardiovascular;  Laterality: N/A;    Family History  Problem Relation Age of Onset  . Cancer Father   . Diabetes Sister   . Diabetes Brother     Social History   Socioeconomic History  . Marital status: Single    Spouse name: Not on file  . Number of children: 3  . Years of education: College  . Highest education level: Not on file  Occupational History    Employer: OTHER    Comment: Disabled  Social Needs  . Financial resource strain: Not on file  . Food insecurity:    Worry: Not on file    Inability: Not on file  . Transportation needs:    Medical: Not on file    Non-medical: Not on file  Tobacco Use  . Smoking status: Light Tobacco Smoker    Packs/day: 0.20    Years: 15.00    Pack years: 3.00    Types: Cigarettes  . Smokeless tobacco: Never Used  . Tobacco comment:  smoking 3-4 a day  Substance and Sexual Activity  . Alcohol use: No    Alcohol/week: 0.0 oz  . Drug use: No  . Sexual activity: Not on file  Lifestyle  . Physical activity:    Days per week: Not on file    Minutes per session: Not on file   . Stress: Not on file  Relationships  . Social connections:    Talks on phone: Not on file    Gets together: Not on file    Attends religious service: Not on file    Active member of club or organization: Not on file    Attends meetings of clubs or organizations: Not on file    Relationship status: Not on file  . Intimate partner violence:    Fear of current or ex partner: Not on file    Emotionally abused: Not on file    Physically abused: Not on file    Forced sexual activity: Not on file  Other Topics Concern  . Not on file  Social History Narrative   Patient is single with 3 children.   Patient is right handed.   Patient has college education.   Patient drinks 2 cups daily.     Current Outpatient Medications:  .  acetaminophen (TYLENOL) 500 MG tablet, Take 2 tablets (1,000 mg total) by mouth every  6 (six) hours as needed., Disp: 30 tablet, Rfl: 0 .  albuterol (PROVENTIL HFA;VENTOLIN HFA) 108 (90 Base) MCG/ACT inhaler, Inhale 2 puffs into the lungs every 6 (six) hours as needed for wheezing or shortness of breath., Disp: , Rfl:  .  atorvastatin (LIPITOR) 40 MG tablet, TAKE 1 TABLET(40 MG) BY MOUTH DAILY AT 6 PM, Disp: 90 tablet, Rfl: 1 .  diclofenac sodium (VOLTAREN) 1 % GEL, Apply 2 g topically 4 (four) times daily., Disp: 3 Tube, Rfl: 5 .  DULoxetine (CYMBALTA) 60 MG capsule, Take 1 capsule (60 mg total) by mouth daily., Disp: 30 capsule, Rfl: 1 .  gabapentin (NEURONTIN) 300 MG capsule, TAKE 1 CAPSULE(300 MG) BY MOUTH THREE TIMES DAILY, Disp: 270 capsule, Rfl: 0 .  Glucose Blood (BLOOD GLUCOSE TEST STRIPS) STRP, Inject 1 Units into the skin 4 (four) times daily -  before meals and at bedtime. OneTouch Ultra mini strips (Patient taking differently: Inject 1 Units into the skin 4 (four) times daily -  before meals and at bedtime. OneTouch Ultra mini strips), Disp: 100 each, Rfl: 3 .  losartan-hydrochlorothiazide (HYZAAR) 100-25 MG tablet, TAKE 1 TABLET BY MOUTH DAILY, Disp: 90  tablet, Rfl: 0 .  metFORMIN (GLUCOPHAGE) 500 MG tablet, TAKE 1 TABLET(500 MG) BY MOUTH DAILY WITH BREAKFAST, Disp: 90 tablet, Rfl: 1 .  Multiple Vitamins-Calcium (ONE-A-DAY WOMENS FORMULA PO), Take 1 tablet by mouth daily., Disp: , Rfl:  .  omeprazole (PRILOSEC) 20 MG capsule, TAKE 1 CAPSULE BY MOUTH TWICE DAILY, Disp: 180 capsule, Rfl: 0 .  ONETOUCH DELICA LANCETS FINE MISC, 1 Units by Does not apply route 4 (four) times daily -  before meals and at bedtime. (Patient taking differently: 1 Units by Does not apply route 4 (four) times daily -  before meals and at bedtime. ), Disp: 100 each, Rfl: 3 .  clopidogrel (PLAVIX) 75 MG tablet, Take 1 tablet (75 mg total) by mouth daily. Patient overdue for an appointment. Must call and schedule for further refills 3rd/final attempt (Patient not taking: Reported on 06/19/2017), Disp: 15 tablet, Rfl: 0  Allergies  Allergen Reactions  . Aspirin Hives, Itching and Other (See Comments)  . Diclofenac Shortness Of Breath    Only with po diclofenac  . Penicillins Hives    Has patient had a PCN reaction causing immediate rash, facial/tongue/throat swelling, SOB or lightheadedness with hypotension: Yes Has patient had a PCN reaction causing severe rash involving mucus membranes or skin necrosis: No Has patient had a PCN reaction that required hospitalization No Has patient had a PCN reaction occurring within the last 10 years: No If all of the above answers are "NO", then may proceed with Cephalosporin use.      ROS See HPI  Objective  Vitals:   06/19/17 0936  BP: 138/72  Pulse: 90  Resp: 16  Temp: 98.3 F (36.8 C)  TempSrc: Oral  SpO2: 97%  Weight: 165 lb (74.8 kg)  Height:  (1.575 m)   Body mass index is 30.18 kg/m.  Physical Exam Vital signs reviewed Constitutional: Patient appears well-developed and well-nourished. HEENT:Head normocephalic, pupils equal and reactive to light, EOM's intact, neck supple without lymphadenopathy,  oropharynx pink and moist without exudate. Neck: Normal range of motion. Neck supple. Cardiovascular: Normal rate, regular rhythm, S1/S2 present. No murmur or rub heard.No BLE edema. Distal pulses intact Pulmonary/Chest: Effort normal and breath sounds clear. No respiratory distress or retractions. Neurological: She is alert and oriented to person, place and time. Facial asymmetry.  Stuttering speech. Grips are weak bilaterally. Usteady gait.  Skin: Warm and dry.  No rash noted. No erythema noted Musculoskeletal: no gross deformities. Bilateral hands: tender to touch, no swelling, erythema or deformity.  Psychiatric: Patient has a normal mood and affect. behavior is normal.   Assessment & Plan RTC in 3 months for/ F/U: DM- recheck A1c, HLD- recheck lipid panel; Annual heath maintenance Cbc, cmet WNL on 02/22/17  Rash- discussed home management and return precautions including when to seek emergency care and printed information in AVS Recommend discontinuing vitamin supplements if rash occurs after taking the supplements Instructions for benadryl PRN at bedtime  RTC for new, worsening symptoms or if rash persists

## 2017-06-20 ENCOUNTER — Ambulatory Visit (INDEPENDENT_AMBULATORY_CARE_PROVIDER_SITE_OTHER): Payer: 59 | Admitting: Adult Health

## 2017-06-20 ENCOUNTER — Encounter: Payer: Self-pay | Admitting: Adult Health

## 2017-06-20 DIAGNOSIS — G5640 Causalgia of unspecified upper limb: Secondary | ICD-10-CM

## 2017-06-20 DIAGNOSIS — R471 Dysarthria and anarthria: Secondary | ICD-10-CM

## 2017-06-20 DIAGNOSIS — M25562 Pain in left knee: Secondary | ICD-10-CM

## 2017-06-20 DIAGNOSIS — G5612 Other lesions of median nerve, left upper limb: Secondary | ICD-10-CM | POA: Diagnosis not present

## 2017-06-20 DIAGNOSIS — G8929 Other chronic pain: Secondary | ICD-10-CM | POA: Diagnosis not present

## 2017-06-20 DIAGNOSIS — G5643 Causalgia of bilateral upper limbs: Secondary | ICD-10-CM | POA: Diagnosis not present

## 2017-06-20 DIAGNOSIS — F339 Major depressive disorder, recurrent, unspecified: Secondary | ICD-10-CM | POA: Diagnosis not present

## 2017-06-20 MED ORDER — GABAPENTIN 300 MG PO CAPS: ORAL_CAPSULE | ORAL | 3 refills | Status: DC

## 2017-06-20 MED ORDER — DULOXETINE HCL 60 MG PO CPEP: 60.0000 mg | ORAL_CAPSULE | Freq: Every day | ORAL | 3 refills | Status: DC

## 2017-06-20 NOTE — Progress Notes (Signed)
I reviewed above note and agree with the assessment and plan.   [name], MD PhD Stroke Neurology 06/20/2017 11:57 AM

## 2017-06-20 NOTE — Patient Instructions (Signed)
Continue clopidogrel 75 mg daily  and lipitor  for secondary stroke prevention  Continue cymbalta  and Neurontin  three times per day for pain   Follow up with [facility] for second opinion   We will look into loop recorder insertion date and see if you still need to have this monitored  Continue to follow up with PCP regarding cholesterol and blood pressure management   Continue to monitor blood pressure at home  Maintain strict control of hypertension with blood pressure goal below 130/90, diabetes with hemoglobin A1c goal below 6.5% and cholesterol with LDL cholesterol (bad cholesterol) goal below 70 mg/dL. I also advised the patient to eat a healthy diet with plenty of whole grains, cereals, fruits and vegetables, exercise regularly and maintain ideal body weight.  Followup in the future with me in 1 year or call earlier if needed        Thank you for coming to see [place] at [facility]. I hope we have been able to provide you high quality care today.  You may receive a patient satisfaction survey over the next few weeks. We would appreciate your feedback and comments so that we may continue to improve ourselves and the health of our patients.

## 2017-06-25 ENCOUNTER — Ambulatory Visit: Payer: 59 | Admitting: Nurse Practitioner

## 2017-06-27 ENCOUNTER — Ambulatory Visit: Payer: 59

## 2017-07-25 ENCOUNTER — Other Ambulatory Visit: Payer: Self-pay | Admitting: Nurse Practitioner

## 2017-07-25 DIAGNOSIS — E1140 Type 2 diabetes mellitus with diabetic neuropathy, unspecified: Secondary | ICD-10-CM

## 2017-07-26 ENCOUNTER — Other Ambulatory Visit: Payer: Self-pay

## 2017-07-26 DIAGNOSIS — K219 Gastro-esophageal reflux disease without esophagitis: Secondary | ICD-10-CM

## 2017-07-26 MED ORDER — OMEPRAZOLE 20 MG PO CPDR: DELAYED_RELEASE_CAPSULE | ORAL | 0 refills | Status: DC

## 2017-08-09 ENCOUNTER — Encounter (HOSPITAL_COMMUNITY): Payer: Self-pay | Admitting: Radiology

## 2017-08-09 ENCOUNTER — Other Ambulatory Visit: Payer: Self-pay

## 2017-08-09 ENCOUNTER — Emergency Department (HOSPITAL_COMMUNITY): Payer: 59

## 2017-08-09 ENCOUNTER — Emergency Department (HOSPITAL_COMMUNITY)
Admission: EM | Admit: 2017-08-09 | Discharge: 2017-08-10 | Disposition: A | Discharge status: Home / Self Care | Payer: 59 | Attending: Emergency Medicine | Admitting: Emergency Medicine

## 2017-08-09 DIAGNOSIS — Z7902 Long term (current) use of antithrombotics/antiplatelets: Secondary | ICD-10-CM | POA: Insufficient documentation

## 2017-08-09 DIAGNOSIS — Z7984 Long term (current) use of oral hypoglycemic drugs: Secondary | ICD-10-CM | POA: Diagnosis not present

## 2017-08-09 DIAGNOSIS — E119 Type 2 diabetes mellitus without complications: Secondary | ICD-10-CM | POA: Insufficient documentation

## 2017-08-09 DIAGNOSIS — F17210 Nicotine dependence, cigarettes, uncomplicated: Secondary | ICD-10-CM | POA: Insufficient documentation

## 2017-08-09 DIAGNOSIS — I10 Essential (primary) hypertension: Secondary | ICD-10-CM | POA: Insufficient documentation

## 2017-08-09 DIAGNOSIS — Z79899 Other long term (current) drug therapy: Secondary | ICD-10-CM | POA: Diagnosis not present

## 2017-08-09 DIAGNOSIS — R55 Syncope and collapse: Secondary | ICD-10-CM | POA: Diagnosis present

## 2017-08-09 DIAGNOSIS — J45909 Unspecified asthma, uncomplicated: Secondary | ICD-10-CM | POA: Insufficient documentation

## 2017-08-09 DIAGNOSIS — E860 Dehydration: Secondary | ICD-10-CM | POA: Insufficient documentation

## 2017-08-09 LAB — BASIC METABOLIC PANEL
Anion gap: 10 (ref 5–15)
BUN: 9 mg/dL (ref 8–23)
CO2: 29 mmol/L (ref 22–32)
Calcium: 10.3 mg/dL (ref 8.9–10.3)
Chloride: 99 mmol/L (ref 98–111)
Creatinine, Ser: 0.93 mg/dL (ref 0.44–1.00)
GFR calc Af Amer: >60 mL/min (ref >60)
GFR calc non Af Amer: >60 mL/min (ref >60)
Glucose, Bld: 158 mg/dL — ABNORMAL HIGH (ref 70–99)
Potassium: 4.4 mmol/L (ref 3.5–5.1)
Sodium: 138 mmol/L (ref 135–145)

## 2017-08-09 LAB — CBG MONITORING, ED: Glucose-Capillary: 157 mg/dL — ABNORMAL HIGH (ref 70–99)

## 2017-08-09 LAB — CBC WITH DIFFERENTIAL/PLATELET
Abs Immature Granulocytes: 0 10*3/uL (ref 0.0–0.1)
Basophils Absolute: 0.1 10*3/uL (ref 0.0–0.1)
Basophils Relative: 0 %
Eosinophils Absolute: 0 10*3/uL (ref 0.0–0.7)
Eosinophils Relative: 0 %
HCT: 44.6 % (ref 36.0–46.0)
Hemoglobin: 14.6 g/dL (ref 12.0–15.0)
Immature Granulocytes: 0 %
Lymphocytes Relative: 10 %
Lymphs Abs: 1.4 10*3/uL (ref 0.7–4.0)
MCH: 29.2 pg (ref 26.0–34.0)
MCHC: 32.7 g/dL (ref 30.0–36.0)
MCV: 89.2 fL (ref 78.0–100.0)
Monocytes Absolute: 0.5 10*3/uL (ref 0.1–1.0)
Monocytes Relative: 4 %
Neutro Abs: 12.2 10*3/uL — ABNORMAL HIGH (ref 1.7–7.7)
Neutrophils Relative %: 86 %
Platelets: 391 10*3/uL (ref 150–400)
RBC: 5 MIL/uL (ref 3.87–5.11)
RDW: 12.7 % (ref 11.5–15.5)
WBC: 14.3 10*3/uL — ABNORMAL HIGH (ref 4.0–10.5)

## 2017-08-09 LAB — TROPONIN I: Troponin I: <0.03 ng/mL (ref <0.03)

## 2017-08-09 MED ORDER — SODIUM CHLORIDE 0.9 % IV BOLUS
1000.0000 mL | Freq: Once | INTRAVENOUS | Status: AC
  Administered 2017-08-09: 1000 mL via INTRAVENOUS

## 2017-08-09 MED ORDER — SODIUM CHLORIDE 0.9 % IV SOLN
INTRAVENOUS | Status: DC
  Administered 2017-08-09: 20:00:00 via INTRAVENOUS

## 2017-08-09 NOTE — ED Triage Notes (Signed)
PT reported to NT that she felt dizzy and was seeing double after orthostatic BP wad finished.

## 2017-08-09 NOTE — ED Provider Notes (Signed)
[facility] Provider Note   CSN: [id number] Arrival date & time: 08/09/17  1619     History   Chief Complaint Chief Complaint  Patient presents with  . Loss of Consciousness    HPI Kara Mendoza is a 67 y.o. female.  67 year old female presents after having a syncopal event just prior to arrival.  Patient states that she was sitting outside on her balcony became very warm and became dizzy.  She went to walk inside and had a syncopal event on the couch.  Denies any recent illnesses.  Did not have any chest pain or shortness of breath or severe headache prior to the event.  Has not had any recent medicine changes.     Past Medical History:  Diagnosis Date  . Abnormality of gait   . Acute bronchitis   . Allergy   . Anxiety   . Anxiety state, unspecified   . Asthma   . Chronic arm pain 2008   left  . Chronic headache   . Chronic leg pain 2008   left  . Chronic pain syndrome   . Constipation   . Diabetes mellitus (HCC)   . Dysarthria   . Esophageal reflux   . Fatigue   . History of fall   . Hyperlipidemia   . Hypertension   . Hyperthyroidism   . LOOP Recorder 10/14/2013  . Major depressive disorder, recurrent episode, moderate (HCC)   . Memory loss   . Neuromuscular disorder (HCC)   . Osteoarthritis   . Stroke (HCC)   . Tobacco use disorder     Patient Active Problem List   Diagnosis Date Noted  . Residual cognitive deficit as late effect of stroke 04/26/2016  . Hemiparesis affecting left side as late effect of cerebrovascular accident (CVA) (HCC) 04/26/2016  . Carpal tunnel syndrome on left 11/29/2015  . DDD (degenerative disc disease), cervical 11/29/2015  . Depression 11/29/2015  . Chronic pain of left knee 09/04/2015  . Headache 09/04/2015  . Paroxysmal atrial fibrillation (HCC) 07/22/2015  . Complex regional pain syndrome type 2 of upper extremity 11/05/2013  . Carpal tunnel syndrome on right 10/22/2013  .  Chronic embolism and thrombosis of deep vein of left upper extremity (HCC) 10/22/2013  . Cardiac device in situ 10/14/2013  . Essential hypertension 09/30/2013  . Complex regional pain syndrome type 1 of both upper extremities 09/30/2013  . Diabetes mellitus (HCC) 09/28/2013  . Hyperthyroidism 09/28/2013  . Median nerve neuritis 09/28/2013  . Ataxia, late effect of cerebrovascular disease 09/28/2013  . Spastic hemiplegia affecting dominant side (HCC) 09/28/2013  . CVA (cerebral infarction) 08/15/2013  . Cerebral embolism with cerebral infarction (HCC) 08/09/2013  . Dysarthria 08/09/2013  . Abnormality of gait 05/01/2012  . Incontinence of urine 05/01/2012  . Tobacco abuse 01/28/2012  . Tobacco dependence syndrome 01/28/2012  . Plantar fasciitis 09/01/2010  . Allergic rhinitis 07/11/2010  . Persistent cough 06/16/2010  . Asthma night-time symptoms 06/14/2010  . GERD (gastroesophageal reflux disease) 05/26/2010  . Stroke (HCC) 05/26/2010  . Hyperlipidemia 05/26/2010  . Osteoarthritis 05/26/2010  . Mononeuritis 05/26/2010    Past Surgical History:  Procedure Laterality Date  . CESAREAN SECTION     x3  . FINGER SURGERY    . LOOP RECORDER IMPLANT  08-12-2013   MDT LINQ implanted by Dr Graciela HusbandsKlein for cryptogenic stroke  . LOOP RECORDER IMPLANT N/A 08/12/2013   Procedure: LOOP RECORDER IMPLANT;  Surgeon: [facility] SalviaSteven C Klein, MD;  Location[facility]  CATH LAB;  Service: Cardiovascular;  Laterality: N/A;  . TEE WITHOUT CARDIOVERSION N/A 08/12/2013   Procedure: TRANSESOPHAGEAL ECHOCARDIOGRAM (TEE);  Surgeon: Wendall Stade, MD;  Location: [facility] ENDOSCOPY;  Service: Cardiovascular;  Laterality: N/A;     OB History   None      Home Medications    Prior to Admission medications   Medication Sig Start Date End Date Taking? Authorizing Provider  acetaminophen (TYLENOL) 500 MG tablet Take 2 tablets (1,000 mg total) by mouth every 6 (six) hours as needed. 11/30/15   Nche, Bonna Gains, NP  albuterol  (PROVENTIL HFA;VENTOLIN HFA) 108 (90 Base) MCG/ACT inhaler Inhale 2 puffs into the lungs every 6 (six) hours as needed for wheezing or shortness of breath.    [provider]  atorvastatin (LIPITOR) 40 MG tablet TAKE 1 TABLET(40 MG) BY MOUTH DAILY AT 6 PM 11/26/16   Nche, Bonna Gains, NP  clopidogrel (PLAVIX) 75 MG tablet Take 1 tablet (75 mg total) by mouth daily. Patient overdue for an appointment. Must call and schedule for further refills 3rd/final attempt 06/19/17   Evaristo Bury, NP  diclofenac sodium (VOLTAREN) 1 % GEL Apply 2 g topically 4 (four) times daily. 06/19/17   Evaristo Bury, NP  DULoxetine (CYMBALTA) 60 MG capsule Take 1 capsule (60 mg total) by mouth daily. 06/20/17   George Hugh, NP  gabapentin (NEURONTIN) 300 MG capsule TAKE 3 CAPSULE(300 MG) BY MOUTH THREE TIMES DAILY 06/20/17   George Hugh, NP  Glucose Blood (BLOOD GLUCOSE TEST STRIPS) STRP Inject 1 Units into the skin 4 (four) times daily -  before meals and at bedtime. OneTouch Ultra mini strips Patient taking differently: Inject 1 Units into the skin 4 (four) times daily -  before meals and at bedtime. OneTouch Ultra mini strips 02/29/16   Nche, Bonna Gains, NP  losartan-hydrochlorothiazide (HYZAAR) 100-25 MG tablet Take 1 tablet by mouth daily. 06/19/17   Evaristo Bury, NP  metFORMIN (GLUCOPHAGE) 500 MG tablet TAKE 1 TABLET BY MOUTH DAILY WITH BREAKFAST 07/25/17   Evaristo Bury, NP  Multiple Vitamins-Calcium (ONE-A-DAY WOMENS FORMULA PO) Take 1 tablet by mouth daily.    [provider]  omeprazole (PRILOSEC) 20 MG capsule TAKE 1 CAPSULE BY MOUTH TWICE DAILY 07/26/17   Evaristo Bury, NP  ONETOUCH DELICA LANCETS FINE MISC 1 Units by Does not apply route 4 (four) times daily -  before meals and at bedtime. Patient taking differently: 1 Units by Does not apply route 4 (four) times daily -  before meals and at bedtime.  02/29/16   Nche, Bonna Gains, NP  sertraline  (ZOLOFT) 50 MG tablet Take by mouth.    [provider]  zolpidem (AMBIEN) 5 MG tablet Take by mouth. 12/10/14   [provider]    Family History Family History  Problem Relation Age of Onset  . Cancer Father   . Diabetes Sister   . Diabetes Brother     Social History Social History   Tobacco Use  . Smoking status: Light Tobacco Smoker    Packs/day: 0.20    Years: 15.00    Pack years: 3.00    Types: Cigarettes  . Smokeless tobacco: Never Used  . Tobacco comment:  smoking 3-4 a day  Substance Use Topics  . Alcohol use: No    Alcohol/week: 0.0 oz  . Drug use: No     Allergies   Aspirin; Diclofenac; and Penicillins   Review of Systems Review of Systems  All other systems reviewed and are negative.    Physical Exam Updated Vital Signs BP (!) 106/57 (BP Location: Right Arm)   Pulse 87   Temp 98.7 F (37.1 C) (Oral)   Resp 15   Ht 1.575 m (5\' 2" )   Wt 76.7 kg (169 lb)   SpO2 100%   BMI 30.91 kg/m   Physical Exam  Constitutional: She is oriented to person, place, and time. She appears well-developed and well-nourished.  Non-toxic appearance. No distress.  HENT:  Head: Normocephalic and atraumatic.  Eyes: Pupils are equal, round, and reactive to light. Conjunctivae, EOM and lids are normal.  Neck: Normal range of motion. Neck supple. No tracheal deviation present. No thyroid mass present.  Cardiovascular: Normal rate, regular rhythm and normal heart sounds. Exam reveals no gallop.  No murmur heard. Pulmonary/Chest: Effort normal and breath sounds normal. No stridor. No respiratory distress. She has no decreased breath sounds. She has no wheezes. She has no rhonchi. She has no rales.  Abdominal: Soft. Normal appearance and bowel sounds are normal. She exhibits no distension. There is no tenderness. There is no rebound and no CVA tenderness.  Musculoskeletal: Normal range of motion. She exhibits no edema or tenderness.  Neurological: She is  alert and oriented to person, place, and time. She has normal strength. No cranial nerve deficit or sensory deficit. GCS eye subscore is 4. GCS verbal subscore is 5. GCS motor subscore is 6.  Skin: Skin is warm and dry. No abrasion and no rash noted.  Psychiatric: She has a normal mood and affect. Her speech is normal and behavior is normal.  Nursing note and vitals reviewed.    ED Treatments / Results  Labs (all labs ordered are listed, but only abnormal results are displayed) Labs Reviewed  CBC WITH DIFFERENTIAL/PLATELET  BASIC METABOLIC PANEL  TROPONIN I    EKG EKG Interpretation  Date/Time:  Friday August 09 2017 16:27:54 EDT Ventricular Rate:  89 PR Interval:    QRS Duration: 90 QT Interval:  371 QTC Calculation: 452 R Axis:   46 Text Interpretation:  Sinus rhythm Right atrial enlargement Low voltage, precordial leads No significant change since last tracing Confirmed by Lorre Nick ([id number]) on 08/09/2017 5:25:23 PM   Radiology No results found.  Procedures Procedures (including critical care time)  Medications Ordered in ED Medications  0.9 %  sodium chloride infusion (has no administration in time range)  sodium chloride 0.9 % bolus 1,000 mL (has no administration in time range)     Initial Impression / Assessment and Plan / ED Course  I have reviewed the triage vital signs and the nursing notes.  Pertinent labs & imaging results that were available during my care of the patient were reviewed by me and considered in my medical decision making (see chart for details).     Patient given IV fluids here and feels better.  Suspect heat related illness.  Stable for discharge  Final Clinical Impressions(s) / ED Diagnoses   Final diagnoses:  None    ED Discharge Orders    None       Lorre Nick, MD 08/09/17 2219

## 2017-08-09 NOTE — ED Triage Notes (Signed)
PT reports she was sitting in a chair on her balcony and became hot . Pt reporfts when she stood up to go into her house everything went black . Pt reports she was able to get to her couch and passed out  After she sat on the couch. EMS rfeporfted ortho statics  BP sitting 111/88 and BP standing 80/54.

## 2017-08-22 ENCOUNTER — Other Ambulatory Visit: Payer: Self-pay | Admitting: Nurse Practitioner

## 2017-08-22 DIAGNOSIS — E782 Mixed hyperlipidemia: Secondary | ICD-10-CM

## 2017-08-28 ENCOUNTER — Other Ambulatory Visit: Payer: Self-pay

## 2017-08-28 ENCOUNTER — Other Ambulatory Visit: Payer: Self-pay | Admitting: Nurse Practitioner

## 2017-08-28 DIAGNOSIS — I639 Cerebral infarction, unspecified: Secondary | ICD-10-CM

## 2017-08-28 MED ORDER — CLOPIDOGREL BISULFATE 75 MG PO TABS: 75.0000 mg | ORAL_TABLET | Freq: Every day | ORAL | 1 refills | Status: DC

## 2017-09-03 ENCOUNTER — Telehealth: Payer: Self-pay | Admitting: Nurse Practitioner

## 2017-09-03 DIAGNOSIS — M79643 Pain in unspecified hand: Secondary | ICD-10-CM

## 2017-09-03 DIAGNOSIS — I639 Cerebral infarction, unspecified: Secondary | ICD-10-CM

## 2017-09-03 NOTE — Telephone Encounter (Signed)
Can you please put in another referral for neurology. If will have to be [place] since [facility]. thanks   Copied from CRM [phone]. Topic: General - Other >> Sep 03, 2017  8:54 AM Tamela OddiHarris, Brenda J wrote: Reason for CRM: Patient called to inform the doctor that [facility] out of range for her transportation.  It is over the 50 mile limited that her transportation covers.  Patient will need a referral for a facility that is closer.  Please advise.  CB# [phone].

## 2017-09-04 ENCOUNTER — Encounter: Payer: Self-pay | Admitting: Neurology

## 2017-09-04 NOTE — Telephone Encounter (Signed)
Referral put in.

## 2017-09-10 ENCOUNTER — Encounter: Payer: Self-pay | Admitting: Neurology

## 2017-09-10 ENCOUNTER — Ambulatory Visit (INDEPENDENT_AMBULATORY_CARE_PROVIDER_SITE_OTHER): Payer: 59 | Admitting: Neurology

## 2017-09-10 VITALS — BP 122/68 | HR 98 | Ht 62.0 in | Wt 162.0 lb

## 2017-09-10 DIAGNOSIS — G5640 Causalgia of unspecified upper limb: Secondary | ICD-10-CM

## 2017-09-10 NOTE — Progress Notes (Signed)
NEUROLOGY CONSULTATION NOTE  Kara Mendoza MRN: [id number] DOB: January 29, 1951  Referring provider: Alphonse Guild, NP Primary care provider: Alphonse Guild, NP  Reason for consult:  Left arm pain  HISTORY OF PRESENT ILLNESS: Kara Mendoza is a 67 year old right-handed female with hypertension, dyslipidemia, diabetes with neuropathy, depression, tobacco abuse and history of left MCA distribution infarct in 2008 with residual language impairment who presents for left upper extremity pain.  She had a stroke on 05/18/06 presenting with impaired language.  MRI of brain was personally reviewed and demonstrated acute/subacute infarct in the left temporoparietal region with extension from the left primary motor cortex.  MRA of hea personally reviewed showed no significant large vessel stenosis or occlusion.  She was admitted to the hospital on 08/09/13 for expressive aphasia.  MRI of brain personally reviewed and demonstrated chronic left parietal infarct with extension of acute infarct into the left parietal operculum, left insula and left inferior parietal lobe surround the area of chronic infarction. Sometime after this stroke, she developed pain in the left upper extremity.  She was told it is residual from her stroke, but she disagrees.  She said it started after hitting the tambourine hard on her hand at church.  She describes severe allodynia over the hand and forearm.  The pain is a burning and shooting pain.  It involves the left thumb and thenar eminence and radiates up to the shoulder.  There is no associated numbness or weakness.  There is no neck pain.  Over the past year, she started experiencing similar pain in her right thumb as well.  She was evaluated at [facility].  She has been given a diagnosis of complex regional pain syndrome.  X-rays of both hands were unremarkable.  NCV-EMG and ultrasound of both upper extremities from 05/14/17 showed mild median neuropathy at both wrists.   However, she started experiencing pain in the right upper extremity and right leg as well, therefore carpal tunnel surgery was not recommended as it was no longer localized to the wrist.  She takes gabapentin 300mg  three to four times daily and Cymbalta 60mg  daily.  She would like something to cure the problem and not just to treat the pain.  PAST MEDICAL HISTORY: Past Medical History:  Diagnosis Date  . Abnormality of gait   . Acute bronchitis   . Allergy   . Anxiety   . Anxiety state, unspecified   . Asthma   . Chronic arm pain 2008   left  . Chronic headache   . Chronic leg pain 2008   left  . Chronic pain syndrome   . Constipation   . Diabetes mellitus (HCC)   . Dysarthria   . Esophageal reflux   . Fatigue   . History of fall   . Hyperlipidemia   . Hypertension   . Hyperthyroidism   . LOOP Recorder 10/14/2013  . Major depressive disorder, recurrent episode, moderate (HCC)   . Memory loss   . Neuromuscular disorder (HCC)   . Osteoarthritis   . Stroke (HCC)   . Tobacco use disorder     PAST SURGICAL HISTORY: Past Surgical History:  Procedure Laterality Date  . CESAREAN SECTION     x3  . FINGER SURGERY    . LOOP RECORDER IMPLANT  08-12-2013   MDT LINQ implanted by Dr Graciela Husbands for cryptogenic stroke  . LOOP RECORDER IMPLANT N/A 08/12/2013   Procedure: LOOP RECORDER IMPLANT;  Surgeon: Duke Salvia, MD;  Location: [facility]  CATH LAB;  Service: Cardiovascular;  Laterality: N/A;  . TEE WITHOUT CARDIOVERSION N/A 08/12/2013   Procedure: TRANSESOPHAGEAL ECHOCARDIOGRAM (TEE);  Surgeon: Wendall Stade, MD;  Location: [facility] ENDOSCOPY;  Service: Cardiovascular;  Laterality: N/A;    MEDICATIONS: Current Outpatient Medications on File Prior to Visit  Medication Sig Dispense Refill  . acetaminophen (TYLENOL) 500 MG tablet Take 2 tablets (1,000 mg total) by mouth every 6 (six) hours as needed. 30 tablet 0  . albuterol (PROVENTIL HFA;VENTOLIN HFA) 108 (90 Base) MCG/ACT inhaler Inhale 2 puffs into  the lungs every 6 (six) hours as needed for wheezing or shortness of breath.    Marland Kitchen atorvastatin (LIPITOR) 40 MG tablet TAKE 1 TABLET BY MOUTH DAILY AT 6 PM 30 tablet 0  . clopidogrel (PLAVIX) 75 MG tablet TAKE 1 TABLET(75 MG) BY MOUTH DAILY 90 tablet 1  . diclofenac sodium (VOLTAREN) 1 % GEL Apply 2 g topically 4 (four) times daily. (Patient taking differently: Apply 2 g topically 4 (four) times daily as needed (pain). ) 1 Tube 0  . DULoxetine (CYMBALTA) 60 MG capsule Take 1 capsule (60 mg total) by mouth daily. 90 capsule 3  . gabapentin (NEURONTIN) 300 MG capsule TAKE 3 CAPSULE(300 MG) BY MOUTH THREE TIMES DAILY 270 capsule 3  . Glucose Blood (BLOOD GLUCOSE TEST STRIPS) STRP Inject 1 Units into the skin 4 (four) times daily -  before meals and at bedtime. OneTouch Ultra mini strips (Patient taking differently: Inject 1 Units into the skin 4 (four) times daily -  before meals and at bedtime. OneTouch Ultra mini strips) 100 each 3  . losartan-hydrochlorothiazide (HYZAAR) 100-25 MG tablet Take 1 tablet by mouth daily. 90 tablet 1  . metFORMIN (GLUCOPHAGE) 500 MG tablet TAKE 1 TABLET BY MOUTH DAILY WITH BREAKFAST 90 tablet 0  . Multiple Vitamins-Calcium (ONE-A-DAY WOMENS FORMULA PO) Take 1 tablet by mouth daily.    Marland Kitchen omeprazole (PRILOSEC) 20 MG capsule TAKE 1 CAPSULE BY MOUTH TWICE DAILY 180 capsule 0  . ONETOUCH DELICA LANCETS FINE MISC 1 Units by Does not apply route 4 (four) times daily -  before meals and at bedtime. (Patient taking differently: 1 Units by Does not apply route 4 (four) times daily -  before meals and at bedtime. ) 100 each 3  . sertraline (ZOLOFT) 50 MG tablet Take 50 mg by mouth daily as needed (anxiety).     Marland Kitchen zolpidem (AMBIEN) 5 MG tablet Take 5 mg by mouth at bedtime as needed for sleep.      No current facility-administered medications on file prior to visit.     ALLERGIES: Allergies  Allergen Reactions  . Aspirin Hives, Itching and Other (See Comments)  . Diclofenac  Shortness Of Breath    Only with po diclofenac  . Penicillins Hives    Has patient had a PCN reaction causing immediate rash, facial/tongue/throat swelling, SOB or lightheadedness with hypotension: Yes Has patient had a PCN reaction causing severe rash involving mucus membranes or skin necrosis: No Has patient had a PCN reaction that required hospitalization No Has patient had a PCN reaction occurring within the last 10 years: No If all of the above answers are "NO", then may proceed with Cephalosporin use.     FAMILY HISTORY: Family History  Problem Relation Age of Onset  . Cancer Father   . Diabetes Sister   . Diabetes Brother     SOCIAL HISTORY: Social History   Socioeconomic History  . Marital status: Single  Spouse name: Not on file  . Number of children: 3  . Years of education: College  . Highest education level: Some college, no degree  Occupational History    Employer: OTHER    Comment: Disabled  Social Needs  . Financial resource strain: Not on file  . Food insecurity:    Worry: Not on file    Inability: Not on file  . Transportation needs:    Medical: Not on file    Non-medical: Not on file  Tobacco Use  . Smoking status: Light Tobacco Smoker    Packs/day: 0.20    Years: 15.00    Pack years: 3.00    Types: Cigarettes  . Smokeless tobacco: Never Used  . Tobacco comment:  smoking 3-4 a day  Substance and Sexual Activity  . Alcohol use: No    Alcohol/week: 0.0 oz  . Drug use: No  . Sexual activity: Not on file  Lifestyle  . Physical activity:    Days per week: Not on file    Minutes per session: Not on file  . Stress: Not on file  Relationships  . Social connections:    Talks on phone: Not on file    Gets together: Not on file    Attends religious service: Not on file    Active member of club or organization: Not on file    Attends meetings of clubs or organizations: Not on file    Relationship status: Not on file  . Intimate partner  violence:    Fear of current or ex partner: Not on file    Emotionally abused: Not on file    Physically abused: Not on file    Forced sexual activity: Not on file  Other Topics Concern  . Not on file  Social History Narrative   Patient is single with 3 children.   Patient is right handed.   Patient has college education.   Patient drinks 2 cups daily.      Patient lives in 2nd floor apartment. Has 17 steps to climb.     REVIEW OF SYSTEMS: Constitutional: No fevers, chills, or sweats, no generalized fatigue, change in appetite Eyes: No visual changes, double vision, eye pain Ear, nose and throat: No hearing loss, ear pain, nasal congestion, sore throat Cardiovascular: No chest pain, palpitations Respiratory:  No shortness of breath at rest or with exertion, wheezes GastrointestinaI: No nausea, vomiting, diarrhea, abdominal pain, fecal incontinence Genitourinary:  No dysuria, urinary retention or frequency Musculoskeletal:  No neck pain, back pain Integumentary: No rash, pruritus, skin lesions Neurological: as above Psychiatric: No depression, insomnia, anxiety Endocrine: No palpitations, fatigue, diaphoresis, mood swings, change in appetite, change in weight, increased thirst Hematologic/Lymphatic:  No purpura, petechiae. Allergic/Immunologic: no itchy/runny eyes, nasal congestion, recent allergic reactions, rashes  PHYSICAL EXAM: Vitals:   09/10/17 1300  BP: 122/68  Pulse: 98  SpO2: 98%   General: No acute distress.  Patient appears well-groomed.  Head:  Normocephalic/atraumatic Eyes:  fundi examined but not visualized Neck: supple, no paraspinal tenderness, full range of motion Back: No paraspinal tenderness Heart: regular rate and rhythm Lungs: Clear to auscultation bilaterally. Vascular: No carotid bruits. Neurological Exam: Mental status: alert and oriented to person, place, and time, recent and remote memory intact, fund of knowledge intact, attention and  concentration intact, speech fluent and not dysarthric, language intact. Cranial nerves: CN I: not tested CN II: pupils equal, round and reactive to light, visual fields intact CN III, IV, VI:  full  range of motion, no nystagmus, no ptosis CN V: facial sensation intact CN VII: upper and lower face symmetric CN VIII: hearing intact CN IX, X: gag intact, uvula midline CN XI: sternocleidomastoid and trapezius muscles intact CN XII: tongue midline Bulk & Tone: normal, no fasciculations. Motor:  5/5 throughout  Sensation:  Pinprick and vibration sensation intact.  Severe allodynia over the left thumb, thenar eminence and forearm. Deep Tendon Reflexes:  2+ throughout, toes downgoing. Finger to nose testing:  Without dysmetria.  Heel to shin:  Without dysmetria.  Gait:  Normal station and stride.  Able to turn and tandem walk. Romberg negative.  IMPRESSION: Bilateral upper extremity pain/neuritis.  Probable complex regional pain syndrome more recently spreading to the right upper extremity, possibly triggered by injury to left from the tambourine but not clear.  It is not pain secondary to stroke as her stroke involved the left hemisphere and her left arm was initially and primarily affected. Symptoms, such as the allodynia, are not consistent with radiculopathy or carpal tunnel syndrome/ulnar neuropathy.  No signs or symptoms to suggest myelopathy.    I suspect management would be focused on treating the pain and a curable treatment is unlikely.  Thank you for allowing me to take part in the care of this patient.  Shon Millet, DO  CC: Alphonse Guild, NP

## 2017-09-10 NOTE — Patient Instructions (Signed)
I think pain management is the best treatment  Complex Regional Pain Syndrome Complex regional pain syndrome (CRPS) is a nerve disorder that causes long-lasting (chronic) pain, usually in a hand, arm, leg, or foot. CRPS usually follows an injury or trauma, such as a fracture or sprain. There are two types of CRPS:  Type 1. This type occurs after an injury or trauma with no known damage to a nerve.  Type 2. This type occurs after injury or trauma damages a nerve.  There are three stages of the condition:  Stage 1. This stage, called the acute stage, may last for three months.  Stage 2. This stage, called the dystrophic stage, may last for three to 12 months.  Stage 3. This stage, called the atrophic stage, may start after one year.  CRPS ranges from mild to severe. For most people CRPS is mild and recovery happens over time. For others, CRPS lasts a very long time and is debilitating. What are the causes? The exact cause of CRPS is not known. What increases the risk? You may be at increased risk if:  You are a woman.  You are approximately 67 years of age.  You have any of the following: ? A family history of CRPS. ? An injury or surgery. ? An infection. ? Cancer. ? Neck problems. ? A stroke. ? A heart attack. ? Asthma.  What are the signs or symptoms? Signs and symptoms in the affected limb are different for each stage. Signs and symptoms of stage 1 include:  Burning pain.  A pins and needles sensation.  Extremely sensitive skin.  Swelling.  Joint stiffness.  Warmth and redness.  Excessive sweating.  Hair and nail growth that is faster than normal.  Signs and symptoms of stage 2 include:  Spreading of pain to the whole limb.  Increased skin sensitivity.  Increased swelling and stiffness.  Coolness of the skin.  Blue discoloration of skin.  Loss of skin wrinkles.  Brittle fingernails.  Signs and symptoms of stage 3 include:  Pain that spreads  to other areas of the body but becomes less severe.  More stiffness, leading to loss of motion.  Skin that is pale, dry, shiny, and tightly stretched.  How is this diagnosed? There is no test to diagnose CRPS. Your health care provider will make a diagnosis based on your signs and symptoms and a physical exam. The exam may include tests to rule out other possible causes of your symptoms. Sometimes imaging tests are done, such as an MRI or bone scan. These tests check for bone changes that might indicate CRPS. How is this treated? Early treatment may prevent CRPS from advancing past stage 1. There is no one treatment that works for everyone. Treatment options may include:  Medicines, such as: ? Nonsteroidal-anti-inflammatory drugs (NSAIDS). ? Steroids. ? Blood pressure drugs. ? Antidepressants. ? Anti-seizure drugs. ? Pain relievers.  Exercise.  Occupational and physical therapy.  Biofeedback.  Mental health counseling.  Numbing injections.  Spinal surgery to implant a spinal cord stimulator or a pain pump.  Follow these instructions at home:  Take medicines only as directed by your health care provider.  Follow an exercise program as directed by your health care provider.  Maintain a healthy weight.  Keep all follow-up visits as directed by your health care provider. This is important. Contact a health care provider if:  Your symptoms change.  Your symptoms get worse.  You develop anxiety or depression. This information is not  intended to replace advice given to you by your health care provider. Make sure you discuss any questions you have with your health care provider. Document Released: 01/12/2002 Document Revised: 06/30/2015 Document Reviewed: 10/19/2013 Elsevier Interactive Patient Education  [employer]2018 [employer]

## 2017-09-27 ENCOUNTER — Other Ambulatory Visit: Payer: Self-pay | Admitting: Nurse Practitioner

## 2017-09-27 DIAGNOSIS — E782 Mixed hyperlipidemia: Secondary | ICD-10-CM

## 2017-10-23 ENCOUNTER — Ambulatory Visit (INDEPENDENT_AMBULATORY_CARE_PROVIDER_SITE_OTHER)
Admission: RE | Admit: 2017-10-23 | Discharge: 2017-10-23 | Disposition: A | Discharge status: Home / Self Care | Payer: 59 | Source: Ambulatory Visit | Attending: Nurse Practitioner | Admitting: Nurse Practitioner

## 2017-10-23 ENCOUNTER — Encounter: Payer: Self-pay | Admitting: Nurse Practitioner

## 2017-10-23 ENCOUNTER — Ambulatory Visit (INDEPENDENT_AMBULATORY_CARE_PROVIDER_SITE_OTHER): Payer: 59 | Admitting: Nurse Practitioner

## 2017-10-23 VITALS — BP 128/60 | HR 97 | Temp 98.5°F | Ht 62.0 in | Wt 159.0 lb

## 2017-10-23 DIAGNOSIS — R531 Weakness: Secondary | ICD-10-CM

## 2017-10-23 DIAGNOSIS — R05 Cough: Secondary | ICD-10-CM

## 2017-10-23 DIAGNOSIS — G5643 Causalgia of bilateral upper limbs: Secondary | ICD-10-CM

## 2017-10-23 DIAGNOSIS — R059 Cough, unspecified: Secondary | ICD-10-CM

## 2017-10-23 DIAGNOSIS — E1142 Type 2 diabetes mellitus with diabetic polyneuropathy: Secondary | ICD-10-CM | POA: Diagnosis not present

## 2017-10-23 LAB — POCT GLYCOSYLATED HEMOGLOBIN (HGB A1C): Hemoglobin A1C: 6.1 % — AB (ref 4.0–5.6)

## 2017-10-23 MED ORDER — FLUTICASONE PROPIONATE 50 MCG/ACT NA SUSP: 1.0000 | Freq: Every day | NASAL | 6 refills | Status: AC

## 2017-10-23 NOTE — Patient Instructions (Addendum)
For your cough, please start flonase as prescribed. You may also take a daily claritin or zyrtec over the counter Please head downstairs for labs and a chest xray today  For your hand pain, Ill need some more information from you about placing a [facility]  For your knee weakness , Please schedule a follow up appointment for further evaluation here with Dr Katrinka BlazingSmith or Dr Jordan LikesSchmitz, our sports medicine providers.  Please return in about 3 months for routine follow up.

## 2017-10-23 NOTE — Assessment & Plan Note (Signed)
Stable Continue current medications - POCT glycosylated hemoglobin (Hb A1C) Lab Results  Component Value Date   HGBA1C 6.1 (A) 10/23/2017

## 2017-10-23 NOTE — Assessment & Plan Note (Signed)
Per last neurology note on 06/20/17, plan was for patient to follow up with specialist, but she  [facility] I am going to reach out to her neurologist to discuss her case prior to referral to another specialist Kara Mendoza

## 2017-10-23 NOTE — Progress Notes (Signed)
Name: Kara Mendoza   MRN: [id number]    DOB: 1950/09/11   Date:10/23/2017       Progress Note  Subjective  Chief Complaint Follow up  HPI  [name] is here today for follow up of diabetes. She is requesting evaluation of several acute complaints as well: cough, leg weakness, hand pain  Diabetes- maintained on  Metformin 500 daily Reports daily medication compliance without noted adverse medication effects. No tremor, diaphoresis, polyuria, polydipsia, polyphagia.  Lab Results  Component Value Date   HGBA1C 6.2 03/07/2017   Cough-  This is a new problem She reports chest congestion and productive cough with white sputum for about 1 month. She reports some allergy symptoms, sneezing. She denies fevers, chills, chest pain, shortness of breath, wheezing, heartburn. She has tried mucinex with no relief.  Right leg weakness- This is a new problem, She c/o right knee "giving out" when climbing stairs for about the past month.  Hand pain This is a chronic problem for her She c/o sharp burning aching pain in bilateral hands, initially in left thumb now in right thumb. She has seen several specialists for this and actually followed by neurology but she says the pain is getting worse I actually referred [facility] to [facility] for [facility] at her request earlier this year, she had an appointment with them on 7/30 but she could not find transportation She is now requesting referral to [employer] for evaluation, seh says she will take a bus there.  Patient Active Problem List   Diagnosis Date Noted  . Residual cognitive deficit as late effect of stroke 04/26/2016  . Hemiparesis affecting left side as late effect of cerebrovascular accident (CVA) (HCC) 04/26/2016  . Carpal tunnel syndrome on left 11/29/2015  . DDD (degenerative disc disease), cervical 11/29/2015  . Depression 11/29/2015  . Chronic pain of left knee 09/04/2015  . Headache 09/04/2015  . Paroxysmal atrial fibrillation (HCC)  07/22/2015  . Complex regional pain syndrome type 2 of upper extremity 11/05/2013  . Carpal tunnel syndrome on right 10/22/2013  . Chronic embolism and thrombosis of deep vein of left upper extremity (HCC) 10/22/2013  . Cardiac device in situ 10/14/2013  . Essential hypertension 09/30/2013  . Complex regional pain syndrome type 1 of both upper extremities 09/30/2013  . Diabetes mellitus (HCC) 09/28/2013  . Hyperthyroidism 09/28/2013  . Median nerve neuritis 09/28/2013  . Ataxia, late effect of cerebrovascular disease 09/28/2013  . Spastic hemiplegia affecting dominant side (HCC) 09/28/2013  . CVA (cerebral infarction) 08/15/2013  . Cerebral embolism with cerebral infarction (HCC) 08/09/2013  . Dysarthria 08/09/2013  . Abnormality of gait 05/01/2012  . Incontinence of urine 05/01/2012  . Tobacco abuse 01/28/2012  . Tobacco dependence syndrome 01/28/2012  . Plantar fasciitis 09/01/2010  . Allergic rhinitis 07/11/2010  . Persistent cough 06/16/2010  . Asthma night-time symptoms 06/14/2010  . GERD (gastroesophageal reflux disease) 05/26/2010  . Stroke (HCC) 05/26/2010  . Hyperlipidemia 05/26/2010  . Osteoarthritis 05/26/2010  . Mononeuritis 05/26/2010    Past Surgical History:  Procedure Laterality Date  . CESAREAN SECTION     x3  . FINGER SURGERY    . LOOP RECORDER IMPLANT  08-12-2013   MDT LINQ implanted by Dr Graciela Husbands for cryptogenic stroke  . LOOP RECORDER IMPLANT N/A 08/12/2013   Procedure: LOOP RECORDER IMPLANT;  Surgeon: [facility] Salvia, MD;  Location: [facility] CATH LAB;  Service: Cardiovascular;  Laterality: N/A;  . TEE WITHOUT CARDIOVERSION N/A 08/12/2013   Procedure: TRANSESOPHAGEAL ECHOCARDIOGRAM (TEE);  Surgeon: Wendall StadePeter C Nishan, MD;  [facility];  Service: Cardiovascular;  Laterality: N/A;    Family History  Problem Relation Age of Onset  . Cancer Father   . Diabetes Sister   . Diabetes Brother     Social History   Socioeconomic History  . Marital status: Single     Spouse name: Not on file  . Number of children: 3  . Years of education: College  . Highest education level: Some college, no degree  Occupational History    Employer: OTHER    Comment: Disabled  Social Needs  . Financial resource strain: Not on file  . Food insecurity:    Worry: Not on file    Inability: Not on file  . Transportation needs:    Medical: Not on file    Non-medical: Not on file  Tobacco Use  . Smoking status: Light Tobacco Smoker    Packs/day: 0.20    Years: 15.00    Pack years: 3.00    Types: Cigarettes  . Smokeless tobacco: Never Used  . Tobacco comment:  smoking 3-4 a day  Substance and Sexual Activity  . Alcohol use: No    Alcohol/week: 0.0 standard drinks  . Drug use: No  . Sexual activity: Not on file  Lifestyle  . Physical activity:    Days per week: Not on file    Minutes per session: Not on file  . Stress: Not on file  Relationships  . Social connections:    Talks on phone: Not on file    Gets together: Not on file    Attends religious service: Not on file    Active member of club or organization: Not on file    Attends meetings of clubs or organizations: Not on file    Relationship status: Not on file  . Intimate partner violence:    Fear of current or ex partner: Not on file    Emotionally abused: Not on file    Physically abused: Not on file    Forced sexual activity: Not on file  Other Topics Concern  . Not on file  Social History Narrative   Patient is single with 3 children.   Patient is right handed.   Patient has college education.   Patient drinks 2 cups daily.      Patient lives in 2nd floor apartment. Has 17 steps to climb.      Current Outpatient Medications:  .  acetaminophen (TYLENOL) 500 MG tablet, Take 2 tablets (1,000 mg total) by mouth every 6 (six) hours as needed., Disp: 30 tablet, Rfl: 0 .  albuterol (PROVENTIL HFA;VENTOLIN HFA) 108 (90 Base) MCG/ACT inhaler, Inhale 2 puffs into the lungs every 6 (six) hours  as needed for wheezing or shortness of breath., Disp: , Rfl:  .  atorvastatin (LIPITOR) 40 MG tablet, TAKE 1 TABLET BY MOUTH DAILY AT 6 PM, Disp: 90 tablet, Rfl: 0 .  clopidogrel (PLAVIX) 75 MG tablet, TAKE 1 TABLET(75 MG) BY MOUTH DAILY, Disp: 90 tablet, Rfl: 1 .  diclofenac sodium (VOLTAREN) 1 % GEL, Apply 2 g topically 4 (four) times daily. (Patient taking differently: Apply 2 g topically 4 (four) times daily as needed (pain). ), Disp: 1 Tube, Rfl: 0 .  DULoxetine (CYMBALTA) 60 MG capsule, Take 1 capsule (60 mg total) by mouth daily., Disp: 90 capsule, Rfl: 3 .  gabapentin (NEURONTIN) 300 MG capsule, TAKE 3 CAPSULE(300 MG) BY MOUTH THREE TIMES DAILY, Disp: 270 capsule, Rfl: 3 .  Glucose Blood (BLOOD GLUCOSE TEST STRIPS) STRP, Inject 1 Units into the skin 4 (four) times daily -  before meals and at bedtime. OneTouch Ultra mini strips (Patient taking differently: Inject 1 Units into the skin 4 (four) times daily -  before meals and at bedtime. OneTouch Ultra mini strips), Disp: 100 each, Rfl: 3 .  losartan-hydrochlorothiazide (HYZAAR) 100-25 MG tablet, Take 1 tablet by mouth daily., Disp: 90 tablet, Rfl: 1 .  metFORMIN (GLUCOPHAGE) 500 MG tablet, TAKE 1 TABLET BY MOUTH DAILY WITH BREAKFAST, Disp: 90 tablet, Rfl: 0 .  Multiple Vitamins-Calcium (ONE-A-DAY WOMENS FORMULA PO), Take 1 tablet by mouth daily., Disp: , Rfl:  .  omeprazole (PRILOSEC) 20 MG capsule, TAKE 1 CAPSULE BY MOUTH TWICE DAILY, Disp: 180 capsule, Rfl: 0 .  ONETOUCH DELICA LANCETS FINE MISC, 1 Units by Does not apply route 4 (four) times daily -  before meals and at bedtime. (Patient taking differently: 1 Units by Does not apply route 4 (four) times daily -  before meals and at bedtime. ), Disp: 100 each, Rfl: 3 .  sertraline (ZOLOFT) 50 MG tablet, Take 50 mg by mouth daily as needed (anxiety). , Disp: , Rfl:  .  zolpidem (AMBIEN) 5 MG tablet, Take 5 mg by mouth at bedtime as needed for sleep. , Disp: , Rfl:   Allergies  Allergen  Reactions  . Aspirin Hives, Itching and Other (See Comments)  . Diclofenac Shortness Of Breath    Only with po diclofenac  . Penicillins Hives    Has patient had a PCN reaction causing immediate rash, facial/tongue/throat swelling, SOB or lightheadedness with hypotension: Yes Has patient had a PCN reaction causing severe rash involving mucus membranes or skin necrosis: No Has patient had a PCN reaction that required hospitalization No Has patient had a PCN reaction occurring within the last 10 years: No If all of the above answers are "NO", then may proceed with Cephalosporin use.      ROS See HPI   Objective  Vitals:   10/23/17 1055  BP: 128/60  Pulse: 97  Temp: 98.5 F (36.9 C)  TempSrc: Oral  SpO2: 96%  Weight: 159 lb (72.1 kg)  Height: 5\' 2"  (1.575 m)    Body mass index is 29.08 kg/m.  Physical Exam Vital signs reviewed. Constitutional: Patient appears well-developed and well-nourished. HEENT:Head normocephalic, pupils equal and reactive to light, EOM's intact, neck supple without lymphadenopathy, oropharynx pink and moist without exudate. Neck: Normal range of motion. Neck supple. Cardiovascular: Normal rate, regular rhythm, S1/S2 present. No murmur or rub heard.No BLE edema. Distal pulses intact Pulmonary/Chest: Effort normal and breath sounds clear. No respiratory distress or retractions. Neurological: She is alert and oriented to person, place and time. Facial asymmetry. Stuttering speech. Grips are weak bilaterally. Usteady gait.  Skin: Warm and dry.  No rash noted. No erythema noted Musculoskeletal: no gross deformities. Bilateral hands: tender to touch, no swelling, erythema or deformity.  Psychiatric: Patient has a normal mood and affect. behavior is normal.    Assessment & Plan RTC in 3 months for routine f/u of chronic conditions, need to update HM/lipid panel if stable  Weakness Concern for meniscal tear Recommend follow up with [facility] for further  evaluation, instructions given to schedule this appointment with our [facility] providers  Cough Could be related to allergies? D-DImer and CXR today to r/o blood clot, infection Will start Flonase and OTC antihistamine course- flonase sent- dosing and side effects discussed F/U with further recommendations pending test  results, she says she does not want to be stuck for labs today so I am not sure she will go for d-dimer - DG Chest 2 View; Future - fluticasone (FLONASE) 50 MCG/ACT nasal spray; Place 1 spray into both nostrils daily.  Dispense: 16 g; Refill: 6 - D-Dimer, Quantitative; Future

## 2017-10-25 ENCOUNTER — Ambulatory Visit: Payer: 59 | Admitting: Family Medicine

## 2017-10-25 DIAGNOSIS — Z0289 Encounter for other administrative examinations: Secondary | ICD-10-CM

## 2017-10-25 NOTE — Progress Notes (Deleted)
Kara HammockSheila L Mendoza - 67 y.o. female MRN [id number]  Date of birth: 1951-01-19  SUBJECTIVE:  Including CC & ROS.  No chief complaint on file.   Kara HammockSheila L Zaffino is a 67 y.o. female that is  ***.  ***   Review of Systems  HISTORY: Past Medical, Surgical, Social, and Family History Reviewed & Updated per EMR.   Pertinent Historical Findings include:  Past Medical History:  Diagnosis Date  . Abnormality of gait   . Acute bronchitis   . Allergy   . Anxiety   . Anxiety state, unspecified   . Asthma   . Chronic arm pain 2008   left  . Chronic headache   . Chronic leg pain 2008   left  . Chronic pain syndrome   . Constipation   . Diabetes mellitus (HCC)   . Dysarthria   . Esophageal reflux   . Fatigue   . History of fall   . Hyperlipidemia   . Hypertension   . Hyperthyroidism   . LOOP Recorder 10/14/2013  . Major depressive disorder, recurrent episode, moderate (HCC)   . Memory loss   . Neuromuscular disorder (HCC)   . Osteoarthritis   . Stroke (HCC)   . Tobacco use disorder     Past Surgical History:  Procedure Laterality Date  . CESAREAN SECTION     x3  . FINGER SURGERY    . LOOP RECORDER IMPLANT  08-12-2013   MDT LINQ implanted by Dr Graciela HusbandsKlein for cryptogenic stroke  . LOOP RECORDER IMPLANT N/A 08/12/2013   Procedure: LOOP RECORDER IMPLANT;  Surgeon: [facility] SalviaSteven C Klein, MD;  [facility];  Service: Cardiovascular;  Laterality: N/A;  . TEE WITHOUT CARDIOVERSION N/A 08/12/2013   Procedure: TRANSESOPHAGEAL ECHOCARDIOGRAM (TEE);  Surgeon: Wendall StadePeter C Nishan, MD;  [facility];  Service: Cardiovascular;  Laterality: N/A;    Allergies  Allergen Reactions  . Aspirin Hives, Itching and Other (See Comments)  . Diclofenac Shortness Of Breath    Only with po diclofenac  . Penicillins Hives    Has patient had a PCN reaction causing immediate rash, facial/tongue/throat swelling, SOB or lightheadedness with hypotension: Yes Has patient had a PCN reaction causing severe rash  involving mucus membranes or skin necrosis: No Has patient had a PCN reaction that required hospitalization No Has patient had a PCN reaction occurring within the last 10 years: No If all of the above answers are "NO", then may proceed with Cephalosporin use.     Family History  Problem Relation Age of Onset  . Cancer Father   . Diabetes Sister   . Diabetes Brother      Social History   Socioeconomic History  . Marital status: Single    Spouse name: Not on file  . Number of children: 3  . Years of education: College  . Highest education level: Some college, no degree  Occupational History    Employer: OTHER    Comment: Disabled  Social Needs  . Financial resource strain: Not on file  . Food insecurity:    Worry: Not on file    Inability: Not on file  . Transportation needs:    Medical: Not on file    Non-medical: Not on file  Tobacco Use  . Smoking status: Light Tobacco Smoker    Packs/day: 0.20    Years: 15.00    Pack years: 3.00    Types: Cigarettes  . Smokeless tobacco: Never Used  . Tobacco comment:  smoking 3-4 a  day  Substance and Sexual Activity  . Alcohol use: No    Alcohol/week: 0.0 standard drinks  . Drug use: No  . Sexual activity: Not on file  Lifestyle  . Physical activity:    Days per week: Not on file    Minutes per session: Not on file  . Stress: Not on file  Relationships  . Social connections:    Talks on phone: Not on file    Gets together: Not on file    Attends religious service: Not on file    Active member of club or organization: Not on file    Attends meetings of clubs or organizations: Not on file    Relationship status: Not on file  . Intimate partner violence:    Fear of current or ex partner: Not on file    Emotionally abused: Not on file    Physically abused: Not on file    Forced sexual activity: Not on file  Other Topics Concern  . Not on file  Social History Narrative   Patient is single with 3 children.   Patient  is right handed.   Patient has college education.   Patient drinks 2 cups daily.      Patient lives in 2nd floor apartment. Has 17 steps to climb.      PHYSICAL EXAM:  VS: There were no vitals taken for this visit. Physical Exam Gen: NAD, alert, cooperative with exam, well-appearing ENT: normal lips, normal nasal mucosa,  Eye: normal EOM, normal conjunctiva and lids CV:  no edema, +2 pedal pulses   Resp: no accessory muscle use, non-labored,  GI: no masses or tenderness, no hernia  Skin: no rashes, no areas of induration  Neuro: normal tone, normal sensation to touch Psych:  normal insight, alert and oriented MSK:  ***      ASSESSMENT & PLAN:   No problem-specific Assessment & Plan notes found for this encounter.

## 2017-10-30 ENCOUNTER — Ambulatory Visit: Payer: 59 | Admitting: Neurology

## 2017-10-30 ENCOUNTER — Encounter

## 2017-11-01 ENCOUNTER — Telehealth: Payer: Self-pay | Admitting: Nurse Practitioner

## 2017-11-01 DIAGNOSIS — G5612 Other lesions of median nerve, left upper limb: Secondary | ICD-10-CM

## 2017-11-01 DIAGNOSIS — I69993 Ataxia following unspecified cerebrovascular disease: Secondary | ICD-10-CM

## 2017-11-01 DIAGNOSIS — G8110 Spastic hemiplegia affecting unspecified side: Secondary | ICD-10-CM

## 2017-11-01 NOTE — Telephone Encounter (Signed)
Referral to [facility] neurology has been placed.

## 2017-11-01 NOTE — Telephone Encounter (Signed)
-----   Message from George Hugh, NP sent at 10/28/2017  7:25 AM EDT ----- After reaching out to Dr. Roda Shutters, he did say that you could refer her to a different provider in this office if needed and if she agrees. I am not familiar with any providers at [facility]. Sorry that this is not much help! George Hugh, NP   ----- Message ----- From: Evaristo Bury, NP Sent: 10/23/2017  12:20 PM EDT To: George Hugh, NP  Mayme Genta, I saw Ms Pedro today for a routine follow up and she complained of continued severe hand pain, would hardly let me touch her hands for examination. [facility] at her request this summer, but she could not find transportation. Today, she requested I place referral to [facility], which we also discussed earlier this summer and I think you had that discussion with her as well, she tells me she is going to buy a bus ticket there. I was reaching out to you to ask your thoughts or how to do this for her, I have not referred to [facility] before. THanks, Alphonse Guild NP [facility]

## 2017-11-01 NOTE — Telephone Encounter (Addendum)
Pt informed of below. She does not want to go back to [facility]. She states Dr. Roda Shutters told her he couldn't do anything for her.  She is requesting a different referral for a new group.  She also states [facility] told her "they could not do the surgery she needs to help her because she would bleed out".  She states someone has to help her because she is in pain everyday and no one will give her pain medication.

## 2017-11-01 NOTE — Telephone Encounter (Signed)
Please let Ms Glatfelter know that I am not able to find any contact information for who to refer her to at [facility]. I reached out to her neurologist, they said that we could refer her to a different neurologist in their office for another opinion, or she can follow up with the [facility] referral

## 2017-11-01 NOTE — Addendum Note (Signed)
Addended by: Evaristo Bury on: 11/01/2017 03:36 PM   Modules accepted: Orders

## 2017-11-05 ENCOUNTER — Other Ambulatory Visit: Payer: Self-pay | Admitting: Nurse Practitioner

## 2017-11-05 DIAGNOSIS — K219 Gastro-esophageal reflux disease without esophagitis: Secondary | ICD-10-CM

## 2017-11-05 DIAGNOSIS — E1140 Type 2 diabetes mellitus with diabetic neuropathy, unspecified: Secondary | ICD-10-CM

## 2017-11-13 LAB — HM DIABETES EYE EXAM

## 2017-11-25 ENCOUNTER — Encounter: Payer: Self-pay | Admitting: Nurse Practitioner

## 2017-12-08 ENCOUNTER — Other Ambulatory Visit: Payer: Self-pay | Admitting: Nurse Practitioner

## 2017-12-08 DIAGNOSIS — I10 Essential (primary) hypertension: Secondary | ICD-10-CM

## 2017-12-31 ENCOUNTER — Telehealth: Payer: Self-pay | Admitting: Nurse Practitioner

## 2017-12-31 DIAGNOSIS — E1142 Type 2 diabetes mellitus with diabetic polyneuropathy: Secondary | ICD-10-CM

## 2017-12-31 DIAGNOSIS — I639 Cerebral infarction, unspecified: Secondary | ICD-10-CM

## 2017-12-31 DIAGNOSIS — G5643 Causalgia of bilateral upper limbs: Secondary | ICD-10-CM

## 2017-12-31 NOTE — Telephone Encounter (Signed)
Copied from CRM [id number]. Topic: Quick Communication - See Telephone Encounter >> Dec 31, 2017  9:59 AM Arlyss Gandyichardson, Wilmont Olund N, NT wrote: CRM for notification. See Telephone encounter for: 12/31/17. [facility] to request an order for an in home aide. She states the previous order expired. Please call [phone] to place the order for the pt.

## 2017-12-31 NOTE — Telephone Encounter (Signed)
I called [employer] @ [phone]. I spoke to Saeg[place]asey. She states she has made a note  for a new pre authorization for in home health aide and we will be contacted by phone or fax when information is needed to complete the request.

## 2017-12-31 NOTE — Telephone Encounter (Signed)
I called number below was transferred to multiple incorrect departments and then the call was dropped. Will try again later.

## 2018-01-07 NOTE — Telephone Encounter (Addendum)
I called number below again- I spoke to Robin. She states there is no record of patient having in home aid services. We need to order this through agency. Please place order.   Call ref # [id number]

## 2018-01-08 NOTE — Addendum Note (Signed)
Addended by: Evaristo BurySHAMBLEY, Gerturde Kuba N on: 01/08/2018 12:54 PM   Modules accepted: Orders

## 2018-01-08 NOTE — Telephone Encounter (Signed)
Order for HH placed.  

## 2018-01-22 ENCOUNTER — Ambulatory Visit: Payer: 59 | Admitting: Nurse Practitioner

## 2018-01-29 ENCOUNTER — Encounter (HOSPITAL_COMMUNITY): Payer: Self-pay

## 2018-01-29 ENCOUNTER — Emergency Department (HOSPITAL_COMMUNITY): Payer: 59

## 2018-01-29 ENCOUNTER — Other Ambulatory Visit: Payer: Self-pay

## 2018-01-29 ENCOUNTER — Emergency Department (HOSPITAL_COMMUNITY)
Admission: EM | Admit: 2018-01-29 | Discharge: 2018-01-29 | Disposition: A | Discharge status: Home / Self Care | Payer: 59 | Attending: Emergency Medicine | Admitting: Emergency Medicine

## 2018-01-29 DIAGNOSIS — F17210 Nicotine dependence, cigarettes, uncomplicated: Secondary | ICD-10-CM | POA: Diagnosis not present

## 2018-01-29 DIAGNOSIS — Z8673 Personal history of transient ischemic attack (TIA), and cerebral infarction without residual deficits: Secondary | ICD-10-CM | POA: Insufficient documentation

## 2018-01-29 DIAGNOSIS — I480 Paroxysmal atrial fibrillation: Secondary | ICD-10-CM | POA: Insufficient documentation

## 2018-01-29 DIAGNOSIS — E785 Hyperlipidemia, unspecified: Secondary | ICD-10-CM | POA: Insufficient documentation

## 2018-01-29 DIAGNOSIS — R55 Syncope and collapse: Secondary | ICD-10-CM | POA: Insufficient documentation

## 2018-01-29 DIAGNOSIS — Z7984 Long term (current) use of oral hypoglycemic drugs: Secondary | ICD-10-CM | POA: Insufficient documentation

## 2018-01-29 DIAGNOSIS — I10 Essential (primary) hypertension: Secondary | ICD-10-CM | POA: Diagnosis not present

## 2018-01-29 DIAGNOSIS — J45909 Unspecified asthma, uncomplicated: Secondary | ICD-10-CM | POA: Diagnosis not present

## 2018-01-29 DIAGNOSIS — Z86718 Personal history of other venous thrombosis and embolism: Secondary | ICD-10-CM | POA: Insufficient documentation

## 2018-01-29 DIAGNOSIS — E876 Hypokalemia: Secondary | ICD-10-CM | POA: Diagnosis not present

## 2018-01-29 DIAGNOSIS — Z79899 Other long term (current) drug therapy: Secondary | ICD-10-CM | POA: Diagnosis not present

## 2018-01-29 DIAGNOSIS — Z7902 Long term (current) use of antithrombotics/antiplatelets: Secondary | ICD-10-CM | POA: Diagnosis not present

## 2018-01-29 DIAGNOSIS — E119 Type 2 diabetes mellitus without complications: Secondary | ICD-10-CM | POA: Diagnosis not present

## 2018-01-29 LAB — CBC WITH DIFFERENTIAL/PLATELET
Abs Immature Granulocytes: 0.03 10*3/uL (ref 0.00–0.07)
Basophils Absolute: 0 10*3/uL (ref 0.0–0.1)
Basophils Relative: 0 %
Eosinophils Absolute: 0.1 10*3/uL (ref 0.0–0.5)
Eosinophils Relative: 1 %
HCT: 43.5 % (ref 36.0–46.0)
Hemoglobin: 14.2 g/dL (ref 12.0–15.0)
Immature Granulocytes: 0 %
Lymphocytes Relative: 19 %
Lymphs Abs: 1.9 10*3/uL (ref 0.7–4.0)
MCH: 28.5 pg (ref 26.0–34.0)
MCHC: 32.6 g/dL (ref 30.0–36.0)
MCV: 87.2 fL (ref 80.0–100.0)
Monocytes Absolute: 0.4 10*3/uL (ref 0.1–1.0)
Monocytes Relative: 4 %
Neutro Abs: 7.2 10*3/uL (ref 1.7–7.7)
Neutrophils Relative %: 76 %
Platelets: 304 10*3/uL (ref 150–400)
RBC: 4.99 MIL/uL (ref 3.87–5.11)
RDW: 12.1 % (ref 11.5–15.5)
WBC: 9.6 10*3/uL (ref 4.0–10.5)
nRBC: 0 % (ref 0.0–0.2)

## 2018-01-29 LAB — COMPREHENSIVE METABOLIC PANEL
ALT: 15 U/L (ref 0–44)
AST: 16 U/L (ref 15–41)
Albumin: 3.6 g/dL (ref 3.5–5.0)
Alkaline Phosphatase: 49 U/L (ref 38–126)
Anion gap: 14 (ref 5–15)
BUN: 11 mg/dL (ref 8–23)
CO2: 27 mmol/L (ref 22–32)
Calcium: 9.8 mg/dL (ref 8.9–10.3)
Chloride: 96 mmol/L — ABNORMAL LOW (ref 98–111)
Creatinine, Ser: 0.93 mg/dL (ref 0.44–1.00)
GFR calc Af Amer: >60 mL/min (ref >60)
GFR calc non Af Amer: >60 mL/min (ref >60)
Glucose, Bld: 197 mg/dL — ABNORMAL HIGH (ref 70–99)
Potassium: 3 mmol/L — ABNORMAL LOW (ref 3.5–5.1)
Sodium: 137 mmol/L (ref 135–145)
Total Bilirubin: 0.6 mg/dL (ref 0.3–1.2)
Total Protein: 6.4 g/dL — ABNORMAL LOW (ref 6.5–8.1)

## 2018-01-29 LAB — I-STAT TROPONIN, ED: Troponin i, poc: 0 ng/mL (ref 0.00–0.08)

## 2018-01-29 LAB — I-STAT CG4 LACTIC ACID, ED
Lactic Acid, Venous: 2.54 mmol/L (ref 0.5–1.9)
Lactic Acid, Venous: 1.89 mmol/L (ref 0.5–1.9)

## 2018-01-29 MED ORDER — POTASSIUM CHLORIDE CRYS ER 20 MEQ PO TBCR
40.0000 meq | EXTENDED_RELEASE_TABLET | Freq: Once | ORAL | Status: AC
  Administered 2018-01-29: 40 meq via ORAL
  Filled 2018-01-29: qty 2

## 2018-01-29 MED ORDER — SODIUM CHLORIDE 0.9 % IV BOLUS
1000.0000 mL | Freq: Once | INTRAVENOUS | Status: AC
  Administered 2018-01-29: 1000 mL via INTRAVENOUS

## 2018-01-29 MED ORDER — ONDANSETRON HCL 4 MG/2ML IJ SOLN
4.0000 mg | Freq: Once | INTRAMUSCULAR | Status: AC
  Administered 2018-01-29: 4 mg via INTRAVENOUS
  Filled 2018-01-29: qty 2

## 2018-01-29 MED ORDER — POTASSIUM CHLORIDE CRYS ER 20 MEQ PO TBCR: 20.0000 meq | EXTENDED_RELEASE_TABLET | Freq: Two times a day (BID) | ORAL | 0 refills | Status: DC

## 2018-01-29 NOTE — ED Notes (Signed)
Patient verbalizes understanding of discharge instructions. Opportunity for questioning and answers were provided. Pt discharged from ED. 

## 2018-01-29 NOTE — ED Triage Notes (Signed)
Pt brought in by EMS du to having syncopal episode that lasted about 60 seconds. Pt had nausea and vomiting after episode. Pt has hx of stroke. Pt c/o generalized weakness from previous stroke and headache. Pt a&ox4.

## 2018-01-29 NOTE — ED Notes (Signed)
Patient transported to CT 

## 2018-01-29 NOTE — ED Provider Notes (Signed)
[facility] Provider Note   CSN: [id number] Arrival date & time: 01/29/18  [id number]     History   Chief Complaint Chief Complaint  Patient presents with  . Loss of Consciousness    HPI Kara Mendoza is a 67 y.o. female.  The history is provided by the patient.  Loss of Consciousness    She has history of diabetes, hypertension, hyperlipidemia, stroke, paroxysmal atrial fibrillation and comes in with near syncopal episode and headache.  She describes bilateral headache tonight.  She got up off of the sofa and felt very dizzy and laid down on the floor.  She then was incontinent of stool and started vomiting.  She does not think there was loss of consciousness.  She is feeling somewhat better now although she still has bilateral headache.  This episode is similar to prior episodes.  She did have some greater difficulty than normal with speech, but speech has returned to baseline.  She denies chest pain, heaviness, tightness, pressure.  She did get sweaty.  Past Medical History:  Diagnosis Date  . Abnormality of gait   . Acute bronchitis   . Allergy   . Anxiety   . Anxiety state, unspecified   . Asthma   . Chronic arm pain 2008   left  . Chronic headache   . Chronic leg pain 2008   left  . Chronic pain syndrome   . Constipation   . Diabetes mellitus (HCC)   . Dysarthria   . Esophageal reflux   . Fatigue   . History of fall   . Hyperlipidemia   . Hypertension   . Hyperthyroidism   . LOOP Recorder 10/14/2013  . Major depressive disorder, recurrent episode, moderate (HCC)   . Memory loss   . Neuromuscular disorder (HCC)   . Osteoarthritis   . Stroke (HCC)   . Tobacco use disorder     Patient Active Problem List   Diagnosis Date Noted  . Residual cognitive deficit as late effect of stroke 04/26/2016  . Hemiparesis affecting left side as late effect of cerebrovascular accident (CVA) (HCC) 04/26/2016  . Carpal tunnel syndrome on  left 11/29/2015  . DDD (degenerative disc disease), cervical 11/29/2015  . Depression 11/29/2015  . Chronic pain of left knee 09/04/2015  . Headache 09/04/2015  . Paroxysmal atrial fibrillation (HCC) 07/22/2015  . Complex regional pain syndrome type 2 of upper extremity 11/05/2013  . Carpal tunnel syndrome on right 10/22/2013  . Chronic embolism and thrombosis of deep vein of left upper extremity (HCC) 10/22/2013  . Cardiac device in situ 10/14/2013  . Essential hypertension 09/30/2013  . Complex regional pain syndrome type 1 of both upper extremities 09/30/2013  . Diabetes mellitus (HCC) 09/28/2013  . Hyperthyroidism 09/28/2013  . Median nerve neuritis 09/28/2013  . Ataxia, late effect of cerebrovascular disease 09/28/2013  . Spastic hemiplegia affecting dominant side (HCC) 09/28/2013  . CVA (cerebral infarction) 08/15/2013  . Cerebral embolism with cerebral infarction (HCC) 08/09/2013  . Dysarthria 08/09/2013  . Abnormality of gait 05/01/2012  . Incontinence of urine 05/01/2012  . Tobacco abuse 01/28/2012  . Tobacco dependence syndrome 01/28/2012  . Plantar fasciitis 09/01/2010  . Allergic rhinitis 07/11/2010  . Persistent cough 06/16/2010  . Asthma night-time symptoms 06/14/2010  . GERD (gastroesophageal reflux disease) 05/26/2010  . Stroke (HCC) 05/26/2010  . Hyperlipidemia 05/26/2010  . Osteoarthritis 05/26/2010  . Mononeuritis 05/26/2010    Past Surgical History:  Procedure Laterality Date  . CESAREAN  SECTION     x3  . FINGER SURGERY    . LOOP RECORDER IMPLANT  08-12-2013   MDT LINQ implanted by Dr Graciela Husbands for cryptogenic stroke  . LOOP RECORDER IMPLANT N/A 08/12/2013   Procedure: LOOP RECORDER IMPLANT;  Surgeon: Duke Salvia, MD;  Location: [facility] CATH LAB;  Service: Cardiovascular;  Laterality: N/A;  . TEE WITHOUT CARDIOVERSION N/A 08/12/2013   Procedure: TRANSESOPHAGEAL ECHOCARDIOGRAM (TEE);  Surgeon: Wendall Stade, MD;  Location: [facility] ENDOSCOPY;  Service: Cardiovascular;   Laterality: N/A;     OB History   No obstetric history on file.      Home Medications    Prior to Admission medications   Medication Sig Start Date End Date Taking? Authorizing Provider  acetaminophen (TYLENOL) 500 MG tablet Take 2 tablets (1,000 mg total) by mouth every 6 (six) hours as needed. 11/30/15   Nche, Bonna Gains, NP  albuterol (PROVENTIL HFA;VENTOLIN HFA) 108 (90 Base) MCG/ACT inhaler Inhale 2 puffs into the lungs every 6 (six) hours as needed for wheezing or shortness of breath.    [provider]  atorvastatin (LIPITOR) 40 MG tablet TAKE 1 TABLET BY MOUTH DAILY AT 6 PM 09/27/17   Evaristo Bury, NP  clopidogrel (PLAVIX) 75 MG tablet TAKE 1 TABLET(75 MG) BY MOUTH DAILY 08/29/17   Evaristo Bury, NP  diclofenac sodium (VOLTAREN) 1 % GEL Apply 2 g topically 4 (four) times daily. Patient taking differently: Apply 2 g topically 4 (four) times daily as needed (pain).  06/19/17   Evaristo Bury, NP  DULoxetine (CYMBALTA) 60 MG capsule Take 1 capsule (60 mg total) by mouth daily. 06/20/17   George Hugh, NP  fluticasone (FLONASE) 50 MCG/ACT nasal spray Place 1 spray into both nostrils daily. 10/23/17   Evaristo Bury, NP  gabapentin (NEURONTIN) 300 MG capsule TAKE 3 CAPSULE(300 MG) BY MOUTH THREE TIMES DAILY 06/20/17   George Hugh, NP  Glucose Blood (BLOOD GLUCOSE TEST STRIPS) STRP Inject 1 Units into the skin 4 (four) times daily -  before meals and at bedtime. OneTouch Ultra mini strips Patient taking differently: Inject 1 Units into the skin 4 (four) times daily -  before meals and at bedtime. OneTouch Ultra mini strips 02/29/16   Nche, Bonna Gains, NP  losartan-hydrochlorothiazide (HYZAAR) 100-25 MG tablet TAKE 1 TABLET BY MOUTH DAILY 12/09/17   Evaristo Bury, NP  metFORMIN (GLUCOPHAGE) 500 MG tablet TAKE 1 TABLET BY MOUTH DAILY WITH BREAKFAST 11/05/17   Evaristo Bury, NP  Multiple Vitamins-Calcium (ONE-A-DAY WOMENS FORMULA  PO) Take 1 tablet by mouth daily.    [provider]  omeprazole (PRILOSEC) 20 MG capsule TAKE 1 CAPSULE BY MOUTH TWICE DAILY 11/05/17   Evaristo Bury, NP  ONETOUCH DELICA LANCETS FINE MISC 1 Units by Does not apply route 4 (four) times daily -  before meals and at bedtime. Patient taking differently: 1 Units by Does not apply route 4 (four) times daily -  before meals and at bedtime.  02/29/16   Nche, Bonna Gains, NP  sertraline (ZOLOFT) 50 MG tablet Take 50 mg by mouth daily as needed (anxiety).     [provider]  zolpidem (AMBIEN) 5 MG tablet Take 5 mg by mouth at bedtime as needed for sleep.  12/10/14   [provider]    Family History Family History  Problem Relation Age of Onset  . Cancer Father   . Diabetes Sister   . Diabetes Brother  Social History Social History   Tobacco Use  . Smoking status: Light Tobacco Smoker    Packs/day: 0.20    Years: 15.00    Pack years: 3.00    Types: Cigarettes  . Smokeless tobacco: Never Used  . Tobacco comment:  smoking 3-4 a day  Substance Use Topics  . Alcohol use: No    Alcohol/week: 0.0 standard drinks  . Drug use: No     Allergies   Aspirin; Diclofenac; and Penicillins   Review of Systems Review of Systems  Cardiovascular: Positive for syncope.  All other systems reviewed and are negative.    Physical Exam Updated Vital Signs BP (!) 97/58   Pulse 76   Resp 17   Ht 5\' 2"  (1.575 m)   Wt 68 kg   SpO2 97%   BMI 27.44 kg/m   Physical Exam Vitals signs and nursing note reviewed.    67 year old female, resting comfortably and in no acute distress. Vital signs are significant for borderline low blood pressure. Oxygen saturation is 97%, which is normal. Head is normocephalic and atraumatic. PERRLA, EOMI. Oropharynx is clear. Neck is nontender and supple without adenopathy or JVD.  There are no carotid bruits. Back is nontender and there is no CVA tenderness. Lungs are clear  without rales, wheezes, or rhonchi. Chest is nontender. Heart has regular rate and rhythm without murmur. Abdomen is soft, flat, nontender without masses or hepatosplenomegaly and peristalsis is normoactive. Extremities have no cyanosis or edema, full range of motion is present.  Marked tenderness to palpation left hand related to prior injury, no swelling or deformity. Skin is warm and dry without rash. Neurologic: She is awake and alert and oriented, speech is mildly dysarthric, mild right central facial droop present, remainder cranial nerves nerves are intact, there are no motor or sensory deficits.  There is no pronator drift.  ED Treatments / Results  Labs (all labs ordered are listed, but only abnormal results are displayed) Labs Reviewed  COMPREHENSIVE METABOLIC PANEL - Abnormal; Notable for the following components:      Result Value   Potassium 3.0 (*)    Chloride 96 (*)    Glucose, Bld 197 (*)    Total Protein 6.4 (*)    All other components within normal limits  I-STAT CG4 LACTIC ACID, ED - Abnormal; Notable for the following components:   Lactic Acid, Venous 2.54 (*)    All other components within normal limits  CBC WITH DIFFERENTIAL/PLATELET  I-STAT TROPONIN, ED  I-STAT CG4 LACTIC ACID, ED  I-STAT CG4 LACTIC ACID, ED    EKG EKG Interpretation  Date/Time:  Wednesday January 29 2018 05:37:56 EST Ventricular Rate:  77 PR Interval:    QRS Duration: 78 QT Interval:  385 QTC Calculation: 436 R Axis:   45 Text Interpretation:  Sinus rhythm Right atrial enlargement RSR' in V1 or V2, probably normal variant When compared with ECG of 08/09/2017, No significant change was found Confirmed by Dione BoozeGlick, Dellas Guard ([id number]) on 01/29/2018 5:49:59 AM   Radiology Ct Head Wo Contrast  Result Date: 01/29/2018 CLINICAL DATA:  Acute presentation with syncope. EXAM: CT HEAD WITHOUT CONTRAST TECHNIQUE: Contiguous axial images were obtained from the base of the skull through the vertex  without intravenous contrast. COMPARISON:  08/09/2017 FINDINGS: Brain: The brainstem shows artifactual low-density related to beam hardening. No cerebellar abnormality. There is an old left parietal cortical and subcortical infarction which is unchanged, showing atrophy and encephalomalacia. Mild chronic small-vessel ischemic changes affect  the hemispheric white matter and basal ganglia regions. No sign of acute infarction, mass lesion, hemorrhage, hydrocephalus or extra-axial collection. Vascular: There is atherosclerotic calcification of the major vessels at the base of the brain. Skull: Negative Sinuses/Orbits: Clear/normal Other: None IMPRESSION: No acute finding by CT. Old left parietal cortical and subcortical infarction. Chronic small-vessel ischemic changes of the white matter. Electronically Signed   By: Paulina Fusi M.D.   On: 01/29/2018 06:56    Procedures Procedures   Medications Ordered in ED Medications  sodium chloride 0.9 % bolus 1,000 mL (1,000 mLs Intravenous New Bag/Given 01/29/18 0610)  ondansetron (ZOFRAN) injection 4 mg (4 mg Intravenous Given 01/29/18 0611)  potassium chloride SA (K-DUR,KLOR-CON) CR tablet 40 mEq (40 mEq Oral Given 01/29/18 0716)     Initial Impression / Assessment and Plan / ED Course  I have reviewed the triage vital signs and the nursing notes.  Pertinent labs & imaging results that were available during my care of the patient were reviewed by me and considered in my medical decision making (see chart for details).  Near syncopal episode.  No evidence of stroke on my exam.  She may have had a vasovagal episode concurrent with her near syncopal episode.  Old records are reviewed, and she has several ED presentations with similar complaints.  We will give IV fluids and check screening labs.  Because of headache, will check CT of head.   ECG is unchanged from prior.  CT scan showed no acute process.  Labs are significant for hypokalemia, presumably  secondary to diuretic use.  Initial lactic acid level is elevated, but has come down with IV fluids.  She is felt to be safe for discharge.  Discharged with prescription for K-Dur as she will need ongoing potassium supplement while she is taking diuretics.  Follow-up with PCP in 1 week. Final Clinical Impressions(s) / ED Diagnoses   Final diagnoses:  Near syncope  Hypokalemia    ED Discharge Orders         Ordered    potassium chloride SA (K-DUR,KLOR-CON) 20 MEQ tablet  2 times daily     01/29/18 0757           Dione Booze, MD 01/29/18 [phone]

## 2018-02-11 ENCOUNTER — Other Ambulatory Visit: Payer: Self-pay | Admitting: Family

## 2018-02-11 DIAGNOSIS — Z1231 Encounter for screening mammogram for malignant neoplasm of breast: Secondary | ICD-10-CM

## 2018-02-24 ENCOUNTER — Other Ambulatory Visit: Payer: Self-pay | Admitting: Nurse Practitioner

## 2018-02-24 DIAGNOSIS — I10 Essential (primary) hypertension: Secondary | ICD-10-CM

## 2018-03-13 ENCOUNTER — Ambulatory Visit: Payer: 59

## 2018-03-24 ENCOUNTER — Other Ambulatory Visit: Payer: Self-pay | Admitting: Nurse Practitioner

## 2018-03-24 DIAGNOSIS — E782 Mixed hyperlipidemia: Secondary | ICD-10-CM

## 2018-04-07 ENCOUNTER — Ambulatory Visit: Payer: 59

## 2018-04-09 ENCOUNTER — Other Ambulatory Visit: Payer: Self-pay | Admitting: Nurse Practitioner

## 2018-04-09 DIAGNOSIS — I639 Cerebral infarction, unspecified: Secondary | ICD-10-CM

## 2018-05-23 ENCOUNTER — Encounter: Payer: Self-pay | Admitting: Family

## 2018-05-23 ENCOUNTER — Ambulatory Visit (INDEPENDENT_AMBULATORY_CARE_PROVIDER_SITE_OTHER): Payer: 59 | Admitting: Family

## 2018-05-23 ENCOUNTER — Other Ambulatory Visit: Payer: Self-pay | Admitting: *Deleted

## 2018-05-23 VITALS — BP 104/80

## 2018-05-23 DIAGNOSIS — I10 Essential (primary) hypertension: Secondary | ICD-10-CM

## 2018-05-23 DIAGNOSIS — G4700 Insomnia, unspecified: Secondary | ICD-10-CM

## 2018-05-23 DIAGNOSIS — K219 Gastro-esophageal reflux disease without esophagitis: Secondary | ICD-10-CM

## 2018-05-23 DIAGNOSIS — I639 Cerebral infarction, unspecified: Secondary | ICD-10-CM | POA: Diagnosis not present

## 2018-05-23 DIAGNOSIS — G5643 Causalgia of bilateral upper limbs: Secondary | ICD-10-CM

## 2018-05-23 DIAGNOSIS — E1140 Type 2 diabetes mellitus with diabetic neuropathy, unspecified: Secondary | ICD-10-CM

## 2018-05-23 DIAGNOSIS — E1142 Type 2 diabetes mellitus with diabetic polyneuropathy: Secondary | ICD-10-CM | POA: Diagnosis not present

## 2018-05-23 MED ORDER — METFORMIN HCL 500 MG PO TABS: 500.0000 mg | ORAL_TABLET | Freq: Every day | ORAL | 0 refills | Status: DC

## 2018-05-23 MED ORDER — OMEPRAZOLE 20 MG PO CPDR: 20.0000 mg | DELAYED_RELEASE_CAPSULE | Freq: Two times a day (BID) | ORAL | 0 refills | Status: DC

## 2018-05-23 MED ORDER — ZOLPIDEM TARTRATE 5 MG PO TABS: 5.0000 mg | ORAL_TABLET | Freq: Every evening | ORAL | 0 refills | Status: DC | PRN

## 2018-05-23 NOTE — Progress Notes (Signed)
Kara Mendoza is a 68 y.o. female with the following history as recorded in [employer]:  Patient Active Problem List   Diagnosis Date Noted  . Residual cognitive deficit as late effect of stroke 04/26/2016  . Hemiparesis affecting left side as late effect of cerebrovascular accident (CVA) (HCC) 04/26/2016  . Carpal tunnel syndrome on left 11/29/2015  . DDD (degenerative disc disease), cervical 11/29/2015  . Depression 11/29/2015  . Chronic pain of left knee 09/04/2015  . Headache 09/04/2015  . Paroxysmal atrial fibrillation (HCC) 07/22/2015  . Complex regional pain syndrome type 2 of upper extremity 11/05/2013  . Carpal tunnel syndrome on right 10/22/2013  . Chronic embolism and thrombosis of deep vein of left upper extremity (HCC) 10/22/2013  . Cardiac device in situ 10/14/2013  . Essential hypertension 09/30/2013  . Complex regional pain syndrome type 1 of both upper extremities 09/30/2013  . Diabetes mellitus (HCC) 09/28/2013  . Hyperthyroidism 09/28/2013  . Median nerve neuritis 09/28/2013  . Ataxia, late effect of cerebrovascular disease 09/28/2013  . Spastic hemiplegia affecting dominant side (HCC) 09/28/2013  . CVA (cerebral vascular accident) (HCC) 08/15/2013  . Cerebral embolism with cerebral infarction (HCC) 08/09/2013  . Dysarthria 08/09/2013  . Abnormality of gait 05/01/2012  . Incontinence of urine 05/01/2012  . Tobacco abuse 01/28/2012  . Tobacco dependence syndrome 01/28/2012  . Plantar fasciitis 09/01/2010  . Allergic rhinitis 07/11/2010  . Persistent cough 06/16/2010  . Asthma night-time symptoms 06/14/2010  . GERD (gastroesophageal reflux disease) 05/26/2010  . Stroke (HCC) 05/26/2010  . Hyperlipidemia 05/26/2010  . Osteoarthritis 05/26/2010  . Mononeuritis 05/26/2010    Current Outpatient Medications  Medication Sig Dispense Refill  . acetaminophen (TYLENOL) 500 MG tablet Take 2 tablets (1,000 mg total) by mouth every 6 (six) hours as needed. 30 tablet 0   . albuterol (PROVENTIL HFA;VENTOLIN HFA) 108 (90 Base) MCG/ACT inhaler Inhale 2 puffs into the lungs every 6 (six) hours as needed for wheezing or shortness of breath.    Marland Kitchen atorvastatin (LIPITOR) 40 MG tablet TAKE 1 TABLET BY MOUTH DAILY AT 6PM 90 tablet 0  . clopidogrel (PLAVIX) 75 MG tablet TAKE 1 TABLET BY MOUTH DAILY 90 tablet 1  . diclofenac sodium (VOLTAREN) 1 % GEL Apply 2 g topically 4 (four) times daily. (Patient taking differently: Apply 2 g topically 4 (four) times daily as needed (pain). ) 1 Tube 0  . DULoxetine (CYMBALTA) 60 MG capsule Take 1 capsule (60 mg total) by mouth daily. 90 capsule 3  . fluticasone (FLONASE) 50 MCG/ACT nasal spray Place 1 spray into both nostrils daily. (Patient taking differently: Place 1 spray into both nostrils daily as needed for allergies. ) 16 g 6  . gabapentin (NEURONTIN) 300 MG capsule TAKE 3 CAPSULE(300 MG) BY MOUTH THREE TIMES DAILY (Patient taking differently: Take 300 mg by mouth 3 (three) times daily. ) 270 capsule 3  . Glucose Blood (BLOOD GLUCOSE TEST STRIPS) STRP Inject 1 Units into the skin 4 (four) times daily -  before meals and at bedtime. OneTouch Ultra mini strips (Patient taking differently: Inject 1 Units into the skin 4 (four) times daily -  before meals and at bedtime. OneTouch Ultra mini strips) 100 each 3  . losartan-hydrochlorothiazide (HYZAAR) 100-25 MG tablet TAKE 1 TABLET BY MOUTH DAILY 90 tablet 0  . metFORMIN (GLUCOPHAGE) 500 MG tablet Take 1 tablet (500 mg total) by mouth daily with breakfast. 90 tablet 0  . Multiple Vitamins-Calcium (ONE-A-DAY WOMENS FORMULA PO) Take 1 tablet by mouth  daily.    . omeprazole (PRILOSEC) 20 MG capsule Take 1 capsule (20 mg total) by mouth 2 (two) times daily. 180 capsule 0  . ONETOUCH DELICA LANCETS FINE MISC 1 Units by Does not apply route 4 (four) times daily -  before meals and at bedtime. (Patient taking differently: 1 Units by Does not apply route 4 (four) times daily -  before meals and at  bedtime. ) 100 each 3  . potassium chloride SA (K-DUR,KLOR-CON) 20 MEQ tablet Take 1 tablet (20 mEq total) by mouth 2 (two) times daily. (Patient not taking: Reported on 05/23/2018) 60 tablet 0  . zolpidem (AMBIEN) 5 MG tablet Take 1 tablet (5 mg total) by mouth at bedtime as needed for sleep. 30 tablet 0   No current facility-administered medications for this visit.     Allergies: Aspirin; Diclofenac; and Penicillins  Past Medical History:  Diagnosis Date  . Abnormality of gait   . Acute bronchitis   . Allergy   . Anxiety   . Anxiety state, unspecified   . Asthma   . Chronic arm pain 2008   left  . Chronic headache   . Chronic leg pain 2008   left  . Chronic pain syndrome   . Constipation   . Diabetes mellitus (HCC)   . Dysarthria   . Esophageal reflux   . Fatigue   . History of fall   . Hyperlipidemia   . Hypertension   . Hyperthyroidism   . LOOP Recorder 10/14/2013  . Major depressive disorder, recurrent episode, moderate (HCC)   . Memory loss   . Neuromuscular disorder (HCC)   . Osteoarthritis   . Stroke (HCC)   . Tobacco use disorder     Past Surgical History:  Procedure Laterality Date  . CESAREAN SECTION     x3  . FINGER SURGERY    . LOOP RECORDER IMPLANT  08-12-2013   MDT LINQ implanted by Dr Graciela Husbands for cryptogenic stroke  . LOOP RECORDER IMPLANT N/A 08/12/2013   Procedure: LOOP RECORDER IMPLANT;  Surgeon: Duke Salvia, MD;  Location: [facility] CATH LAB;  Service: Cardiovascular;  Laterality: N/A;  . TEE WITHOUT CARDIOVERSION N/A 08/12/2013   Procedure: TRANSESOPHAGEAL ECHOCARDIOGRAM (TEE);  Surgeon: Wendall Stade, MD;  Location: [facility] ENDOSCOPY;  Service: Cardiovascular;  Laterality: N/A;    Family History  Problem Relation Age of Onset  . Cancer Father   . Diabetes Sister   . Diabetes Brother     Social History   Tobacco Use  . Smoking status: Light Tobacco Smoker    Packs/day: 0.20    Years: 15.00    Pack years: 3.00    Types: Cigarettes  . Smokeless tobacco:  Never Used  . Tobacco comment:  smoking 3-4 a day  Substance Use Topics  . Alcohol use: No    Alcohol/week: 0.0 standard drinks    Subjective:    I connected with [name] on 05/23/18 at  1:20 PM EDT by a video enabled telemedicine application and verified that I am speaking with the correct person using two identifiers.   I discussed the limitations of evaluation and management by telemedicine and the availability of in person appointments. The patient expressed understanding and agreed to proceed.  Follow-up on chronic care needs including: 1) Type 2 Diabetes; 2) Hypertension; 3) GERD; 4) History of CVA; Denies any concerns for low blood sugar; states blood sugar averages 120; last Hgba1c done in 10/2017 very good at 6.1; takes Metformin  once per day; attempted to check her blood pressure while on phone today but had difficulty getting her machine to work- needs new batteries; Denies any chest pain, shortness of breath, blurred vision; does have chronic headaches secondary to stroke- under care of neurology/ has spoken to  Requesting refills on her Omeprazole;      Objective:  Vitals:   05/23/18 1345  BP: 104/80    General: Well developed, well nourished, in no acute distress  Head: Normocephalic and atraumatic  Lungs: Respirations unlabored;   Neurologic: Alert and oriented; stuttering speech is noted- this is not new issue; face symmetrical;     Assessment:  1. Essential hypertension   2. Type 2 diabetes mellitus with diabetic polyneuropathy, without long-term current use of insulin (HCC)   3. Cerebrovascular accident (CVA), unspecified mechanism (HCC)   4. Insomnia, unspecified type   5. Gastroesophageal reflux disease, esophagitis presence not specified   6. Complex regional pain syndrome type 2 of both upper extremities     Plan:  1. Stable; blood pressure is checked while on video visit; 2. Patient checks her blood sugar while on the visit; non-fasting blood  glucose is 122; will bring to office in 2 months for updated labs; 3. Patient has updated prescription for Plavix; under care of neurology;  4. Refill given on Ambien 5 mg daily; 5. Stable; refill on Omeprazole; 6. This is not a new problem for patient- patient has contact information for pain management from her neurologist and will contact them to schedule. Will re-evaluate in 2 months. May need to consider vascular consult but would like to see what pain management thinks first.  Spent 30 minutes with patient; greater than 50% spent in counseling;    No follow-ups on file.  No orders of the defined types were placed in this encounter.   Requested Prescriptions   Signed Prescriptions Disp Refills  . zolpidem (AMBIEN) 5 MG tablet 30 tablet 0    Sig: Take 1 tablet (5 mg total) by mouth at bedtime as needed for sleep.

## 2018-05-29 ENCOUNTER — Encounter: Payer: Self-pay | Admitting: Internal Medicine

## 2018-05-29 ENCOUNTER — Ambulatory Visit: Payer: Self-pay

## 2018-05-29 ENCOUNTER — Other Ambulatory Visit: Payer: Self-pay

## 2018-05-29 ENCOUNTER — Ambulatory Visit (INDEPENDENT_AMBULATORY_CARE_PROVIDER_SITE_OTHER): Payer: 59 | Admitting: Internal Medicine

## 2018-05-29 DIAGNOSIS — R300 Dysuria: Secondary | ICD-10-CM | POA: Diagnosis not present

## 2018-05-29 MED ORDER — SULFAMETHOXAZOLE-TRIMETHOPRIM 800-160 MG PO TABS: 1.0000 | ORAL_TABLET | Freq: Two times a day (BID) | ORAL | 0 refills | Status: DC

## 2018-05-29 NOTE — Progress Notes (Signed)
Virtual Visit via Video Note  I connected with Kara Mendoza on 05/29/18 at  4:00 PM EDT by a video enabled telemedicine application and verified that I am speaking with the correct person using two identifiers.   I discussed the limitations of evaluation and management by telemedicine and the availability of in person appointments. The patient expressed understanding and agreed to proceed.  History of Present Illness: The patient is a 68 y.o. female with visit for urinary pain and low stomach pain. Denies fevers or chills. Denies nausea or vomiting. Started about 1 week ago and is progressively worsening. Has no new pain in back. Denies discharge or blood in urine. Denies vaginal pain or discharge. Denies rash. Overall it is worsening. Has tried drinking more liquids and this has not helped.   Observations/Objective: Appearance: casual, breathing appears normal, casual grooming, abdomen does not appear distended, throat normal, mental status is A and O times 3  Assessment and Plan: See problem oriented charting  Follow Up Instructions: Rx bactrim 5 day course  I discussed the assessment and treatment plan with the patient. The patient was provided an opportunity to ask questions and all were answered. The patient agreed with the plan and demonstrated an understanding of the instructions.    The patient was advised to call back or seek an in-person evaluation if the symptoms worsen or if the condition fails to improve as anticipated.  Myrlene Broker, MD

## 2018-05-29 NOTE — Telephone Encounter (Signed)
Appointment has been with Dr.Crawford for today 4/23

## 2018-05-29 NOTE — Telephone Encounter (Signed)
Noted  

## 2018-05-29 NOTE — Assessment & Plan Note (Signed)
Sounds to be acute cystitis. Given pandemic and risk factors she would be benefited from presumptive therapy with rx for bactrim 5 day course. If no resolution needs U/A and culture.

## 2018-05-29 NOTE — Telephone Encounter (Signed)
Patient called and says for the past week she's been having pain with urination and lower abdominal pain. She says her urine has an odor and is yellow sometimes and other times it's clear. She says she has vaginal discharge since the painful urination started. She denies fever, urgency, blood in urine, flank pain. She says the pain is a 10 every time she goes. I called the office and spoke to Tammy, [facility] who asks to speak to the patient, the call was connected successfully.   Answer Assessment - Initial Assessment Questions 1. SEVERITY: "How bad is the pain?"  (e.g., Scale 1-10; mild, moderate, or severe)   - MILD (1-3): complains slightly about urination hurting   - MODERATE (4-7): interferes with normal activities     - SEVERE (8-10): excruciating, unwilling or unable to urinate because of the pain      10 2. FREQUENCY: "How many times have you had painful urination today?"      Every time I go 3. PATTERN: "Is pain present every time you urinate or just sometimes?"      Yes and in lower abdomen 4. ONSET: "When did the painful urination start?"      Last week 5. FEVER: "Do you have a fever?" If so, ask: "What is your temperature, how was it measured, and when did it start?"     No 6. PAST UTI: "Have you had a urine infection before?" If so, ask: "When was the last time?" and "What happened that time?"      No 7. CAUSE: "What do you think is causing the painful urination?"  (e.g., UTI, scratch, Herpes sore)     I don't know 8. OTHER SYMPTOMS: "Do you have any other symptoms?" (e.g., flank pain, vaginal discharge, genital sores, urgency, blood in urine)     Sometimes vaginal discharge 9. PREGNANCY: "Is there any chance you are pregnant?" "When was your last menstrual period?"     No  Protocols used: URINATION PAIN - FEMALE-A-AH

## 2018-06-10 ENCOUNTER — Other Ambulatory Visit: Payer: Self-pay | Admitting: *Deleted

## 2018-06-10 DIAGNOSIS — I10 Essential (primary) hypertension: Secondary | ICD-10-CM

## 2018-06-10 MED ORDER — LOSARTAN POTASSIUM-HCTZ 100-25 MG PO TABS: 1.0000 | ORAL_TABLET | Freq: Every day | ORAL | 0 refills | Status: DC

## 2018-06-19 ENCOUNTER — Telehealth: Payer: Self-pay

## 2018-06-19 NOTE — Telephone Encounter (Signed)
Yes just please enter that in the appt note. Thank you

## 2018-06-19 NOTE — Telephone Encounter (Signed)
I called pt that her visit will be change to video due to COVID 19. Pt gave verbal consent to do video and to file insurance. Pt has done video visit with another provider and understands the process. She cannot access her email from her phone. Her phone service is boost. I stated since [employer] cell phone carrier is not on our list to text. I stated provider can text her the link from the doxy appt. I updated chart, meds, pharmacy for pt.

## 2018-06-19 NOTE — Telephone Encounter (Signed)
Provider will text pt link day of visit because pts cell phone carrier is not on preferred list.

## 2018-06-24 ENCOUNTER — Ambulatory Visit (INDEPENDENT_AMBULATORY_CARE_PROVIDER_SITE_OTHER): Payer: 59 | Admitting: Adult Health

## 2018-06-24 ENCOUNTER — Other Ambulatory Visit: Payer: Self-pay

## 2018-06-24 DIAGNOSIS — Z0289 Encounter for other administrative examinations: Secondary | ICD-10-CM

## 2018-07-16 ENCOUNTER — Other Ambulatory Visit: Payer: Self-pay | Admitting: Internal Medicine

## 2018-07-16 ENCOUNTER — Other Ambulatory Visit: Payer: Self-pay | Admitting: Adult Health

## 2018-07-16 ENCOUNTER — Other Ambulatory Visit: Payer: Self-pay | Admitting: Family

## 2018-07-16 DIAGNOSIS — F339 Major depressive disorder, recurrent, unspecified: Secondary | ICD-10-CM

## 2018-07-16 DIAGNOSIS — K219 Gastro-esophageal reflux disease without esophagitis: Secondary | ICD-10-CM

## 2018-07-16 DIAGNOSIS — E1140 Type 2 diabetes mellitus with diabetic neuropathy, unspecified: Secondary | ICD-10-CM

## 2018-07-16 DIAGNOSIS — G5643 Causalgia of bilateral upper limbs: Secondary | ICD-10-CM

## 2018-07-16 DIAGNOSIS — E782 Mixed hyperlipidemia: Secondary | ICD-10-CM

## 2018-07-17 NOTE — Telephone Encounter (Signed)
Last seen you on 05/23/18 for a chronic care f/u. Were you taking over care?

## 2018-07-18 MED ORDER — ATORVASTATIN CALCIUM 40 MG PO TABS: ORAL_TABLET | ORAL | 0 refills | Status: DC

## 2018-08-01 ENCOUNTER — Ambulatory Visit (INDEPENDENT_AMBULATORY_CARE_PROVIDER_SITE_OTHER): Payer: 59 | Admitting: Family

## 2018-08-01 ENCOUNTER — Other Ambulatory Visit: Payer: Self-pay

## 2018-08-01 ENCOUNTER — Other Ambulatory Visit (INDEPENDENT_AMBULATORY_CARE_PROVIDER_SITE_OTHER): Payer: 59

## 2018-08-01 ENCOUNTER — Other Ambulatory Visit: Payer: 59

## 2018-08-01 ENCOUNTER — Encounter: Payer: Self-pay | Admitting: Family

## 2018-08-01 VITALS — BP 116/88 | HR 86 | Temp 98.7°F | Ht 62.0 in | Wt 159.1 lb

## 2018-08-01 DIAGNOSIS — R200 Anesthesia of skin: Secondary | ICD-10-CM

## 2018-08-01 DIAGNOSIS — E1142 Type 2 diabetes mellitus with diabetic polyneuropathy: Secondary | ICD-10-CM

## 2018-08-01 DIAGNOSIS — R202 Paresthesia of skin: Secondary | ICD-10-CM

## 2018-08-01 DIAGNOSIS — R300 Dysuria: Secondary | ICD-10-CM | POA: Diagnosis not present

## 2018-08-01 DIAGNOSIS — I639 Cerebral infarction, unspecified: Secondary | ICD-10-CM

## 2018-08-01 DIAGNOSIS — G90513 Complex regional pain syndrome I of upper limb, bilateral: Secondary | ICD-10-CM | POA: Diagnosis not present

## 2018-08-01 DIAGNOSIS — G8110 Spastic hemiplegia affecting unspecified side: Secondary | ICD-10-CM

## 2018-08-01 DIAGNOSIS — I69319 Unspecified symptoms and signs involving cognitive functions following cerebral infarction: Secondary | ICD-10-CM

## 2018-08-01 DIAGNOSIS — I69993 Ataxia following unspecified cerebrovascular disease: Secondary | ICD-10-CM

## 2018-08-01 LAB — CBC WITH DIFFERENTIAL/PLATELET
Basophils Absolute: 0 10*3/uL (ref 0.0–0.1)
Basophils Relative: 0.6 % (ref 0.0–3.0)
Eosinophils Absolute: 0.1 10*3/uL (ref 0.0–0.7)
Eosinophils Relative: 1.2 % (ref 0.0–5.0)
HCT: 42.2 % (ref 36.0–46.0)
Hemoglobin: 14.2 g/dL (ref 12.0–15.0)
Lymphocytes Relative: 21.2 % (ref 12.0–46.0)
Lymphs Abs: 1.6 10*3/uL (ref 0.7–4.0)
MCHC: 33.7 g/dL (ref 30.0–36.0)
MCV: 87.8 fl (ref 78.0–100.0)
Monocytes Absolute: 0.5 10*3/uL (ref 0.1–1.0)
Monocytes Relative: 5.9 % (ref 3.0–12.0)
Neutro Abs: 5.4 10*3/uL (ref 1.4–7.7)
Neutrophils Relative %: 71.1 % (ref 43.0–77.0)
Platelets: 351 10*3/uL (ref 150.0–400.0)
RBC: 4.8 Mil/uL (ref 3.87–5.11)
RDW: 12.4 % (ref 11.5–15.5)
WBC: 7.6 10*3/uL (ref 4.0–10.5)

## 2018-08-01 LAB — POC URINALSYSI DIPSTICK (AUTOMATED)
Bilirubin, UA: NEGATIVE
Blood, UA: NEGATIVE
Glucose, UA: NEGATIVE
Ketones, UA: NEGATIVE
Leukocytes, UA: NEGATIVE
Nitrite, UA: NEGATIVE
Protein, UA: NEGATIVE
Spec Grav, UA: 1.015 (ref 1.010–1.025)
Urobilinogen, UA: 0.2 E.U./dL
pH, UA: 6 (ref 5.0–8.0)

## 2018-08-01 LAB — COMPREHENSIVE METABOLIC PANEL
ALT: 13 U/L (ref 0–35)
AST: 13 U/L (ref 0–37)
Albumin: 4.1 g/dL (ref 3.5–5.2)
Alkaline Phosphatase: 60 U/L (ref 39–117)
BUN: 9 mg/dL (ref 6–23)
CO2: 32 mEq/L (ref 19–32)
Calcium: 9.7 mg/dL (ref 8.4–10.5)
Chloride: 100 mEq/L (ref 96–112)
Creatinine, Ser: 0.74 mg/dL (ref 0.40–1.20)
GFR: 94.04 mL/min (ref >60.00)
Glucose, Bld: 130 mg/dL — ABNORMAL HIGH (ref 70–99)
Potassium: 3.3 mEq/L — ABNORMAL LOW (ref 3.5–5.1)
Sodium: 141 mEq/L (ref 135–145)
Total Bilirubin: 0.7 mg/dL (ref 0.2–1.2)
Total Protein: 6.8 g/dL (ref 6.0–8.3)

## 2018-08-01 LAB — TSH: TSH: 0.55 u[IU]/mL (ref 0.35–4.50)

## 2018-08-01 LAB — VITAMIN B12: Vitamin B-12: 956 pg/mL — ABNORMAL HIGH (ref 211–911)

## 2018-08-01 LAB — HEMOGLOBIN A1C: Hgb A1c MFr Bld: 6 % (ref 4.6–6.5)

## 2018-08-01 MED ORDER — NITROFURANTOIN MONOHYD MACRO 100 MG PO CAPS: 100.0000 mg | ORAL_CAPSULE | Freq: Two times a day (BID) | ORAL | 0 refills | Status: DC

## 2018-08-01 NOTE — Progress Notes (Signed)
Kara Mendoza is a 68 y.o. female with the following history as recorded in EpicCare:  Patient Active Problem List   Diagnosis Date Noted  . Dysuria 05/29/2018  . Residual cognitive deficit as late effect of stroke 04/26/2016  . Hemiparesis affecting left side as late effect of cerebrovascular accident (CVA) ([place]) 04/26/2016  . Carpal tunnel syndrome on left 11/29/2015  . DDD (degenerative disc disease), cervical 11/29/2015  . Depression 11/29/2015  . Chronic pain of left knee 09/04/2015  . Headache 09/04/2015  . Paroxysmal atrial fibrillation ([place]) 07/22/2015  . Complex regional pain syndrome type 2 of upper extremity 11/05/2013  . Carpal tunnel syndrome on right 10/22/2013  . Chronic embolism and thrombosis of deep vein of left upper extremity ([place]) 10/22/2013  . Cardiac device in situ 10/14/2013  . Essential hypertension 09/30/2013  . Complex regional pain syndrome type 1 of both upper extremities 09/30/2013  . Diabetes mellitus ([place]) 09/28/2013  . Hyperthyroidism 09/28/2013  . Median nerve neuritis 09/28/2013  . Ataxia, late effect of cerebrovascular disease 09/28/2013  . Spastic hemiplegia affecting dominant side ([place]) 09/28/2013  . CVA (cerebral vascular accident) ([place]) 08/15/2013  . Cerebral embolism with cerebral infarction ([place]) 08/09/2013  . Dysarthria 08/09/2013  . Abnormality of gait 05/01/2012  . Incontinence of urine 05/01/2012  . Tobacco abuse 01/28/2012  . Tobacco dependence syndrome 01/28/2012  . Plantar fasciitis 09/01/2010  . Allergic rhinitis 07/11/2010  . Persistent cough 06/16/2010  . Asthma night-time symptoms 06/14/2010  . GERD (gastroesophageal reflux disease) 05/26/2010  . Stroke ([place]) 05/26/2010  . Hyperlipidemia 05/26/2010  . Osteoarthritis 05/26/2010  . Mononeuritis 05/26/2010    Current Outpatient Medications  Medication Sig Dispense Refill  . acetaminophen (TYLENOL) 500 MG tablet Take 2 tablets (1,000 mg total) by mouth every 6 (six) hours  as needed. 30 tablet 0  . albuterol (PROVENTIL HFA;VENTOLIN HFA) 108 (90 Base) MCG/ACT inhaler Inhale 2 puffs into the lungs every 6 (six) hours as needed for wheezing or shortness of breath.    Marland Kitchen albuterol (VENTOLIN HFA) 108 (90 Base) MCG/ACT inhaler     . atorvastatin (LIPITOR) 40 MG tablet TAKE 1 TABLET BY MOUTH DAILY AT 6PM 90 tablet 0  . clopidogrel (PLAVIX) 75 MG tablet TAKE 1 TABLET BY MOUTH DAILY 90 tablet 1  . diclofenac sodium (VOLTAREN) 1 % GEL Apply 2 g topically 4 (four) times daily. (Patient taking differently: Apply 2 g topically 4 (four) times daily as needed (pain). ) 1 Tube 0  . DULoxetine (CYMBALTA) 60 MG capsule Take 1 capsule (60 mg total) by mouth daily. 90 capsule 3  . fluticasone (FLONASE) 50 MCG/ACT nasal spray Place 1 spray into both nostrils daily. (Patient taking differently: Place 1 spray into both nostrils daily as needed for allergies. ) 16 g 6  . gabapentin (NEURONTIN) 300 MG capsule TAKE 3 CAPSULE(300 MG) BY MOUTH THREE TIMES DAILY (Patient taking differently: Take 300 mg by mouth 3 (three) times daily. ) 270 capsule 3  . Glucose Blood (BLOOD GLUCOSE TEST STRIPS) STRP Inject 1 Units into the skin 4 (four) times daily -  before meals and at bedtime. OneTouch Ultra mini strips (Patient taking differently: Inject 1 Units into the skin 4 (four) times daily -  before meals and at bedtime. OneTouch Ultra mini strips) 100 each 3  . hydrochlorothiazide (HYDRODIURIL) 25 MG tablet Take 25 mg by mouth daily.    Marland Kitchen losartan-hydrochlorothiazide (HYZAAR) 100-25 MG tablet Take 1 tablet by mouth daily. 90 tablet 0  .  metFORMIN (GLUCOPHAGE) 500 MG tablet TAKE 1 TABLET(500 MG) BY MOUTH DAILY WITH BREAKFAST 90 tablet 0  . montelukast (SINGULAIR) 10 MG tablet TAKE ONE TABLET BY MOUTH EVERY DAY IN THE EVENING FOR ALLERGIES    . Multiple Vitamins-Calcium (ONE-A-DAY WOMENS FORMULA PO) Take 1 tablet by mouth daily.    Marland Kitchen omeprazole (PRILOSEC) 20 MG capsule TAKE 1 CAPSULE(20 MG) BY MOUTH TWICE  DAILY 180 capsule 0  . ONETOUCH DELICA LANCETS FINE MISC 1 Units by Does not apply route 4 (four) times daily -  before meals and at bedtime. (Patient taking differently: 1 Units by Does not apply route 4 (four) times daily -  before meals and at bedtime. ) 100 each 3  . potassium chloride SA (K-DUR,KLOR-CON) 20 MEQ tablet Take 1 tablet (20 mEq total) by mouth 2 (two) times daily. 60 tablet 0  . pregabalin (LYRICA) 150 MG capsule TAKE 1 CAPSULE (150 MG) BY MOUTH 2 TIMES PER DAY FOR PAIN    . telmisartan (MICARDIS) 80 MG tablet Take 80 mg by mouth daily.    Marland Kitchen telmisartan (MICARDIS) 80 MG tablet     . topiramate (TOPAMAX) 50 MG tablet TAKE ONE TABLET BY MOUTH TWICE DAILY FOR MIGRAINES    . topiramate (TOPAMAX) 50 MG tablet     . zolpidem (AMBIEN) 5 MG tablet TAKE 1 TABLET(5 MG) BY MOUTH AT BEDTIME AS NEEDED FOR SLEEP 30 tablet 0  . zolpidem (AMBIEN) 5 MG tablet     . nitrofurantoin, macrocrystal-monohydrate, (MACROBID) 100 MG capsule Take 1 capsule (100 mg total) by mouth 2 (two) times daily. 14 capsule 0   No current facility-administered medications for this visit.     Allergies: Aspirin, Diclofenac, and Penicillins  Past Medical History:  Diagnosis Date  . Abnormality of gait   . Acute bronchitis   . Allergy   . Anxiety   . Anxiety state, unspecified   . Asthma   . Chronic arm pain 2008   left  . Chronic headache   . Chronic leg pain 2008   left  . Chronic pain syndrome   . Constipation   . Diabetes mellitus ([place])   . Dysarthria   . Esophageal reflux   . Fatigue   . History of fall   . Hyperlipidemia   . Hypertension   . Hyperthyroidism   . LOOP Recorder 10/14/2013  . Major depressive disorder, recurrent episode, moderate ([place])   . Memory loss   . Neuromuscular disorder ([place])   . Osteoarthritis   . Stroke ([place])   . Tobacco use disorder     Past Surgical History:  Procedure Laterality Date  . CESAREAN SECTION     x3  . FINGER SURGERY    . LOOP RECORDER IMPLANT   08-12-2013   MDT LINQ implanted by Dr Caryl Comes for cryptogenic stroke  . LOOP RECORDER IMPLANT N/A 08/12/2013   Procedure: LOOP RECORDER IMPLANT;  Surgeon: Deboraha Sprang, MD;  Location: [facility] CATH LAB;  Service: Cardiovascular;  Laterality: N/A;  . TEE WITHOUT CARDIOVERSION N/A 08/12/2013   Procedure: TRANSESOPHAGEAL ECHOCARDIOGRAM (TEE);  Surgeon: Josue Hector, MD;  Location: [facility] ENDOSCOPY;  Service: Cardiovascular;  Laterality: N/A;    Family History  Problem Relation Age of Onset  . Cancer Father   . Diabetes Sister   . Diabetes Brother     Social History   Tobacco Use  . Smoking status: Light Tobacco Smoker    Packs/day: 0.20    Years: 15.00  Pack years: 3.00    Types: Cigarettes  . Smokeless tobacco: Never Used  . Tobacco comment:  smoking 3-4 a day  Substance Use Topics  . Alcohol use: No    Alcohol/week: 0.0 standard drinks    Subjective:  Patient presents with concerns for possible UTI; notes she was treated in April but symptoms have returned in the past 2-3 weeks; Requesting paperwork to be completed for home health aide; has had a stroke in 2008 and 2017 and experienced complications; apparently, has very little social support as none of her children are willing or able to help with her care; Very difficult historian- in reviewing note, it looks like she has wanted to go to [facility] and [facility] for neurologic issues secondary to the stroke; however, has no way of getting to these hospitals; was at [facility] but is adamant that she will not go back there. Her neurologists locally have repeatedly asked her to establish with pain management but she notes she has lost the number repeatedly.  Objective:  Vitals:   08/01/18 1410  BP: 116/88  Pulse: 86  Temp: 98.7 F (37.1 C)  TempSrc: Oral  SpO2: 95%  Weight: 159 lb 1.3 oz (72.2 kg)  Height: _0  (1.575 m)    General: Well developed, well nourished, in no acute distress  Skin : Warm and dry.  Head: Normocephalic and  atraumatic  Lungs: Respirations unlabored; clear to auscultation bilaterally without wheeze, rales, rhonchi  CVS exam: normal rate and regular rhythm.  Abdomen: Soft; nontender; nondistended; normoactive bowel sounds; no masses or hepatosplenomegaly  Musculoskeletal: No deformities; no active joint inflammation  Extremities: No edema, cyanosis, clubbing  Vessels: Symmetric bilaterally  Neurologic: Alert and oriented; speech intact; face symmetrical; uses cane; does stutter some while providing history  Assessment:  1. Complex regional pain syndrome type 1 of both upper extremities   2. Dysuria   3. Type 2 diabetes mellitus with diabetic polyneuropathy, without long-term current use of insulin ([place])   4. Numbness and tingling   5. Cerebrovascular accident (CVA), unspecified mechanism ([place])   6. Residual cognitive deficit as late effect of stroke   7. Ataxia, late effect of cerebrovascular disease   8. Spastic hemiplegia affecting dominant side ([place])     Plan:  1. Referral to pain management; 2. Check urine culture; Rx for Macrobid 100 mg bid x 7 days; 3. Check Hgba1c today; Will refer her back to her neurologist for yearly follow-up as she has not been able to go to [facility] or [facility]; paperwork completed as requested in support of home health aide;   No follow-ups on file.  Orders Placed This Encounter  Procedures  . Urine Culture    Standing Status:   Future    Number of Occurrences:   1    Standing Expiration Date:   08/01/2019  . CBC w/Diff    Standing Status:   Future    Number of Occurrences:   1    Standing Expiration Date:   08/01/2019  . Comp Met (CMET)    Standing Status:   Future    Number of Occurrences:   1    Standing Expiration Date:   08/01/2019  . TSH    Standing Status:   Future    Number of Occurrences:   1    Standing Expiration Date:   08/01/2019  . B12    Standing Status:   Future    Number of Occurrences:   1  Standing Expiration Date:   08/01/2019   . HgB A1c    Standing Status:   Future    Number of Occurrences:   1    Standing Expiration Date:   08/01/2019  . Ambulatory referral to Pain Clinic    Referral Priority:   Routine    Referral Type:   Consultation    Referral Reason:   Specialty Services Required    Requested Specialty:   Pain Medicine    Number of Visits Requested:   1  . Ambulatory referral to Neurology    Referral Priority:   Routine    Referral Type:   Consultation    Referral Reason:   Specialty Services Required    Requested Specialty:   Neurology    Number of Visits Requested:   1  . POCT Urinalysis Dipstick (Automated)    Requested Prescriptions   Signed Prescriptions Disp Refills  . nitrofurantoin, macrocrystal-monohydrate, (MACROBID) 100 MG capsule 14 capsule 0    Sig: Take 1 capsule (100 mg total) by mouth 2 (two) times daily.   ]

## 2018-08-02 LAB — URINE CULTURE
MICRO NUMBER:: 611938
Result:: NO GROWTH
SPECIMEN QUALITY:: ADEQUATE

## 2018-08-26 ENCOUNTER — Encounter: Payer: Self-pay | Admitting: Adult Health

## 2018-08-26 ENCOUNTER — Other Ambulatory Visit: Payer: Self-pay

## 2018-08-26 ENCOUNTER — Ambulatory Visit (INDEPENDENT_AMBULATORY_CARE_PROVIDER_SITE_OTHER): Payer: 59 | Admitting: Adult Health

## 2018-08-26 VITALS — BP 150/88 | HR 77 | Temp 96.6°F | Ht 62.0 in | Wt 155.4 lb

## 2018-08-26 DIAGNOSIS — Z8673 Personal history of transient ischemic attack (TIA), and cerebral infarction without residual deficits: Secondary | ICD-10-CM

## 2018-08-26 DIAGNOSIS — G5640 Causalgia of unspecified upper limb: Secondary | ICD-10-CM | POA: Diagnosis not present

## 2018-08-26 NOTE — Patient Instructions (Addendum)
Your Plan:  Contact your primary care provider in regards to where they have had referral sent. They will be able to assist you in setting up appointment for pain management.   Your new pain management provider can take over prescribing of your current medications   Follow up as needed at this time     Thank you for coming to see [place] at [facility]. I hope we have been able to provide you high quality care today.  You may receive a patient satisfaction survey over the next few weeks. We would appreciate your feedback and comments so that we may continue to improve ourselves and the health of our patients.

## 2018-08-26 NOTE — Progress Notes (Signed)
STROKE NEUROLOGY FOLLOW UP NOTE  NAME: Kara Mendoza DOB: 1950-12-13  REASON FOR VISIT: Follow-up regarding CRPS HISTORY FROM: pt and chart    History Summary Kara Mendoza is a 68 y.o. female with prior hx of left MCA distribution infarct in 2008 with residual language impairment, cryptogenic stroke with a recorder placement, hypertension, dyslipidemia, tobacco abuse. Loop recorder placed and found AF episode in 07/2015. She was put on Xarelto by cardiology but pt later did not want to continue. She was then put back on plavix.   She has continued to be followed in the for stroke follow-up with complaints of LUE pain which has been present since 2014.  She has had extensive evaluations from a vascular standpoint, orthopedics and multiple neurologist who have all confirmed likely complex regional pain syndrome.  She was previously being followed by pain management but did not continue routine follow-up.  Symptoms initially consisted of left hand pain, subjective redness, swelling and warmth and slowly progressed into her right arm.  Unable to function with her hands bilaterally due to pain which will increase with any type of light touch.  She was evaluated by Dr. Metta Clines at [facility] neurology on 09/10/2017 and again diagnosed with complex regional pain syndrome and ongoing symptoms are likely not related to secondary stroke, radiculopathy, carpal tunnel syndrome or myelopathy and recommended focusing on treating the pain as curable treatment unlikely.  She was referred to [facility] for second opinion but has not been able to go at this time due to transportation issues.  She was referred to pain management by PCP on 08/01/2018 but has not been scheduled for evaluation at this time. She disagrees with ongoing diagnosis of complex regional pain syndrome and expresses frustration as she is unable to receive help from any providers.  She states she injured her hand 4 years prior with pain starting in left  thumb and was unable to do surgery as she was told "I would die". She is unsure of surgical procedure but states "its from a vein popping up and will then become warm and red."  She has trialed use of THC which did provide her with mild relief. She is in the process of having a home health aide assist her throughout the day.  She currently continues on Topamax, gabapentin, Lyrica and Cymbalta for ongoing pain.  Continues on clopidogrel and atorvastatin for secondary stroke prevention without reported side effects.  Blood pressure today 150/88.    REVIEW OF SYSTEMS: Full 14 system review of systems performed and notable only for those listed below and in HPI above, all others are negative: Numbness and pain  The following represents the patient's updated allergies and side effects list: Allergies  Allergen Reactions   Aspirin Hives, Itching and Other (See Comments)   Diclofenac Shortness Of Breath    Only with po diclofenac   Penicillins Hives    Has patient had a PCN reaction causing immediate rash, facial/tongue/throat swelling, SOB or lightheadedness with hypotension: Yes Has patient had a PCN reaction causing severe rash involving mucus membranes or skin necrosis: No Has patient had a PCN reaction that required hospitalization No Has patient had a PCN reaction occurring within the last 10 years: No If all of the above answers are "NO", then may proceed with Cephalosporin use.     Labs since last visit of relevance include the following: Results for orders placed or performed in visit on 08/01/18  Urine Culture   Specimen: Blood  Result Value Ref Range   MICRO NUMBER: [id number]    SPECIMEN QUALITY: Adequate    Sample Source NOT GIVEN    STATUS: FINAL    Result: No Growth   HgB A1c  Result Value Ref Range   Hgb A1c MFr Bld 6.0 4.6 - 6.5 %  B12  Result Value Ref Range   Vitamin B-12 956 (H) 211 - 911 pg/mL  TSH  Result Value Ref Range   TSH 0.55 0.35 - 4.50 uIU/mL  Comp Met  (CMET)  Result Value Ref Range   Sodium 141 135 - 145 mEq/L   Potassium 3.3 (L) 3.5 - 5.1 mEq/L   Chloride 100 96 - 112 mEq/L   CO2 32 19 - 32 mEq/L   Glucose, Bld 130 (H) 70 - 99 mg/dL   BUN 9 6 - 23 mg/dL   Creatinine, Ser 0.74 0.40 - 1.20 mg/dL   Total Bilirubin 0.7 0.2 - 1.2 mg/dL   Alkaline Phosphatase 60 39 - 117 U/L   AST 13 0 - 37 U/L   ALT 13 0 - 35 U/L   Total Protein 6.8 6.0 - 8.3 g/dL   Albumin 4.1 3.5 - 5.2 g/dL   Calcium 9.7 8.4 - 10.5 mg/dL   GFR 94.04 >60.00 mL/min  CBC w/Diff  Result Value Ref Range   WBC 7.6 4.0 - 10.5 K/uL   RBC 4.80 3.87 - 5.11 Mil/uL   Hemoglobin 14.2 12.0 - 15.0 g/dL   HCT 42.2 36.0 - 46.0 %   MCV 87.8 78.0 - 100.0 fl   MCHC 33.7 30.0 - 36.0 g/dL   RDW 12.4 11.5 - 15.5 %   Platelets 351.0 150.0 - 400.0 K/uL   Neutrophils Relative % 71.1 43.0 - 77.0 %   Lymphocytes Relative 21.2 12.0 - 46.0 %   Monocytes Relative 5.9 3.0 - 12.0 %   Eosinophils Relative 1.2 0.0 - 5.0 %   Basophils Relative 0.6 0.0 - 3.0 %   Neutro Abs 5.4 1.4 - 7.7 K/uL   Lymphs Abs 1.6 0.7 - 4.0 K/uL   Monocytes Absolute 0.5 0.1 - 1.0 K/uL   Eosinophils Absolute 0.1 0.0 - 0.7 K/uL   Basophils Absolute 0.0 0.0 - 0.1 K/uL     Neurologic Examination   Temperature (!) 96.6 F (35.9 C), temperature source Oral, height 5' 2"  (1.575 m), weight 155 lb 6.4 oz (70.5 kg).  General - Well nourished, well developed, middle-aged African-American female expressing frustration with ongoing diagnosis and continued pain  Ophthalmologic - not able to see through.  Cardiovascular - Regular rate and rhythm with no murmur.  Mental Status -  Level of arousal and orientation to time, place, and person were intact. Language including expression, naming, repetition, comprehension was assessed and found intact.  Cranial Nerves II - XII - II - Visual field intact OU. III, IV, VI - Extraocular movements intact. V - Facial sensation intact bilaterally. VII - Facial movement intact  bilaterally. VIII - Hearing & vestibular intact bilaterally. X - Palate elevates symmetrically XI - Chin turning & shoulder shrug intact bilaterally. XII - Tongue protrusion intact.  Motor Strength - The patients strength was normal in all extremities and pronator drift was absent.  Bulk was normal and fasciculations were absent.  Motor Tone - Muscle tone was assessed at the neck and appendages and was normal.  Reflexes - The patients reflexes were 1+ in all extremities and she had no pathological reflexes.  Sensory - unable to assess sensory  due to increased pain of bilateral upper extremities even with light touch  Coordination - The patient had normal movements in the hands with no ataxia or dysmetria.  Tremor was absent.  Gait and Station - normal gait.  Data reviewed: I personally reviewed the images and agree with the radiology interpretations.  NCV/US 05/14/17 Conclusion: This is an abnormal study and is compared with data from a 2015 study. There continues to be evidence consistent with a mild median neuropathy at the left wrist (2015 study showed similar degree of median neuropathy on the right side).  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 Clinical Note: Her symptoms are suggestive of a neuroma along a branch of the palmar cutaneous nerve. As this nerve crossed over the flexor retinaculum into the palm, I was not able to definitely follow it so cannot rule out a neuroma that may be only visible with ultra HRUS.    Assessment: As you may recall, she is a 68 y.o. African American female with PMH of left MCA distribution infarct in 2008 with residual language impairment, cryptogenic stroke in 2015, atrial fibrillation found on loop recorder with refusal of continuing Xarelto, hypertension, dyslipidemia, tobacco abuse, and likely CRPS.  She has continued to be followed in this office by Dr.  Erlinda Hong for CRPS and has had multiple evaluations by different specialty providers who have all diagnosed her with CRPS.  She is upset about this as she does not agree with this diagnosis and believes it was due to prior hand injury 4 years ago.  She is awaiting evaluation by pain management as her PCP recently referred her to.  She also continues to try to find transportation to [facility] for additional opinion as she believes they can do surgical procedure.  Plan:  -Encourage contacting PCP office as she has not been contacted at this time by pain management facility in which she was referred to.  Highly encouraged being followed by pain management provider at this time until she is able to secure a ride to [facility] for additional opinion -she is under the impression that they will be able to perform the needed surgical procedure to relieve her pain -She will continue on gabapentin, Lyrica, Cymbalta and Topamax but advised her that when she establishes care with a pain management provider, it will be recommended for them to take over prescribing so that all prescriptions for her pain will be coming from one provider - continue plavix and statin for stroke prevention - check BP and glucose at home -Advised patient to continue to stay active and eat a healthy diet - Follow up with your primary care physician for stroke risk factor modification. Recommend maintain blood pressure goal <130/80, diabetes with hemoglobin A1c goal below 6.5% and lipids with LDL cholesterol goal below 70 mg/dL.   No further testing from a neurological standpoint regarding her ongoing symptoms as she has been extensively evaluated with multiple studies.  She will follow-up in this office as needed and advised her that our office will no longer prescribe ongoing medications for CRPS as it should be managed by pain management provider.  Advised her to call office with any questions or concerns regarding prior stroke  I spent more than 25  minutes of face to face time with the patient. Greater than 50% of time was spent in counseling and coordination of care.    Venancio Poisson, AGNP-BC  [facility] [address]  [place], [place] [address]  Phone [phone] Fax [phone]

## 2018-08-27 NOTE — Progress Notes (Signed)
I agree with the above plan 

## 2018-09-11 ENCOUNTER — Telehealth: Payer: Self-pay | Admitting: Family

## 2018-09-11 NOTE — Telephone Encounter (Signed)
Pt called and stated that she would like a call back from the nurse. She would like to increase number of hours for home health. Please advise

## 2018-09-11 NOTE — Telephone Encounter (Signed)
I spoke to patient about this earlier today and [facility] said it would be up to her case manager about getting increased home health hours with her aid not [place], she currently has 2 hours per day and she wants 4. I LVM for patient to call us back to as what case manager told her.

## 2018-09-17 NOTE — Telephone Encounter (Signed)
Called and spoke with [place]. Informed her that the home health agency would need let us know how many hours patient needed to increase the hours. Per Davy Pique she said we would need to determine how many hours she needed and this would need to go through [place]. I informed her we would not be contacting Medicaid for increased hours as we have only met patient one time and we are not her PCP. Davy Pique gave me number to call her supervision, Loree Fee and said she may be able to help me locate the form for increased hours. Called [place] at [phone]. Had to leave message. Waiting for her to call me back with info.

## 2018-09-17 NOTE — Telephone Encounter (Signed)
Kara Mendoza with [facility] is asking for a callback from Kara Mendoza regarding pt's need for increased hours. Please advise. [phone]

## 2018-09-17 NOTE — Telephone Encounter (Signed)
I can't answer this question- they home health agency will have decide how many hours she qualifies for. Please see what they need from [place]- okay to give order if they are asking for order to increase the hours though.

## 2018-09-22 ENCOUNTER — Other Ambulatory Visit: Payer: Self-pay | Admitting: Adult Health

## 2018-09-22 DIAGNOSIS — G5643 Causalgia of bilateral upper limbs: Secondary | ICD-10-CM

## 2018-09-22 DIAGNOSIS — F339 Major depressive disorder, recurrent, unspecified: Secondary | ICD-10-CM

## 2018-09-26 ENCOUNTER — Telehealth: Payer: Self-pay

## 2018-09-26 NOTE — Telephone Encounter (Signed)
Copied from [place] [id number]. Topic: General - Inquiry >> Sep 26, 2018  3:45 PM Scherrie Gerlach wrote: Reason for CRM: pt is trying to get extra aid hours and sonja at [facility] advised the pt she does not have a pcp.  They need someone to cotact to ask for the addition hours. Pt wants you to call them and tell them her dr name.  Becky Sax [phone] Pt used to see Annamarie Dawley >> Sep 26, 2018  3:59 PM Para Skeans A wrote: Can you and Mickel Baas help with this?

## 2018-09-29 NOTE — Telephone Encounter (Signed)
Let's try that form you mentioned that could ask for increased hours. At that point, I don't know much else either.

## 2018-09-29 NOTE — Telephone Encounter (Signed)
Document left on Laura's desk.

## 2018-09-30 NOTE — Telephone Encounter (Signed)
Pt calling and want to know status of previous message. She want call back from nurse.

## 2018-10-02 NOTE — Telephone Encounter (Signed)
Kara Mendoza has left message for patient to contact our office regarding her paperwork and request for more hours.

## 2018-10-07 ENCOUNTER — Telehealth: Payer: Self-pay | Admitting: Family

## 2018-10-07 NOTE — Telephone Encounter (Signed)
I called patient again today about her request for increased hours for home health care aide. I have asked patient to call back with days of the week she has aide and how many hours per day she is receiving care. This is the second call place to patient asking for this information.

## 2018-10-14 ENCOUNTER — Telehealth: Payer: Self-pay | Admitting: Family

## 2018-10-14 ENCOUNTER — Other Ambulatory Visit: Payer: Self-pay | Admitting: Family

## 2018-10-14 DIAGNOSIS — I10 Essential (primary) hypertension: Secondary | ICD-10-CM

## 2018-10-14 DIAGNOSIS — I639 Cerebral infarction, unspecified: Secondary | ICD-10-CM

## 2018-10-14 DIAGNOSIS — E1140 Type 2 diabetes mellitus with diabetic neuropathy, unspecified: Secondary | ICD-10-CM

## 2018-10-14 DIAGNOSIS — E782 Mixed hyperlipidemia: Secondary | ICD-10-CM

## 2018-10-14 DIAGNOSIS — K219 Gastro-esophageal reflux disease without esophagitis: Secondary | ICD-10-CM

## 2018-10-14 MED ORDER — ATORVASTATIN CALCIUM 40 MG PO TABS: ORAL_TABLET | ORAL | 0 refills | Status: DC

## 2018-10-14 MED ORDER — METFORMIN HCL 500 MG PO TABS: 500.0000 mg | ORAL_TABLET | Freq: Every day | ORAL | 0 refills | Status: DC

## 2018-10-14 MED ORDER — LOSARTAN POTASSIUM-HCTZ 100-25 MG PO TABS: 1.0000 | ORAL_TABLET | Freq: Every day | ORAL | 0 refills | Status: DC

## 2018-10-14 MED ORDER — OMEPRAZOLE 20 MG PO CPDR: 20.0000 mg | DELAYED_RELEASE_CAPSULE | Freq: Two times a day (BID) | ORAL | 0 refills | Status: DC

## 2018-10-14 MED ORDER — CLOPIDOGREL BISULFATE 75 MG PO TABS: 75.0000 mg | ORAL_TABLET | Freq: Every day | ORAL | 1 refills | Status: DC

## 2018-10-14 NOTE — Telephone Encounter (Signed)
Please let her know that Ambien is not something I am going to be able to send through mail order for her. Otherwise, her refills have been updated as requested by our office and her neurologist.

## 2018-10-14 NOTE — Telephone Encounter (Signed)
Attempted to reach patient today but her voicemail was full and unable to leave message. Will try and call her back again or send out letter that we were trying to reach her.

## 2018-10-15 NOTE — Telephone Encounter (Signed)
Second attempt to reach patient but voicemail was full.

## 2018-10-17 ENCOUNTER — Other Ambulatory Visit: Payer: Self-pay | Admitting: Internal Medicine

## 2018-10-17 ENCOUNTER — Other Ambulatory Visit: Payer: Self-pay | Admitting: Family

## 2018-10-17 ENCOUNTER — Telehealth: Payer: Self-pay | Admitting: Nurse Practitioner

## 2018-10-17 DIAGNOSIS — E782 Mixed hyperlipidemia: Secondary | ICD-10-CM

## 2018-10-17 DIAGNOSIS — I10 Essential (primary) hypertension: Secondary | ICD-10-CM

## 2018-10-17 NOTE — Telephone Encounter (Addendum)
Bobbie with [place] on behalf of [facility] (3rd party) calling to advise the pt has reached out to them to ask for help in her home, help with moving around ADL's and to clean the house etc.PT and eval.  Jolayne Haines says pt would like the dr to call the [employer] to help with transportation.  Pt states [employer] needs a dr note. [employer]: [phone] or [phone]  Pt has advised them Jodi Mourning is her pcp

## 2018-10-17 NOTE — Telephone Encounter (Signed)
What kind of help does she need with transportation? Daily transportation or just to doctor's appointments? I need this request clarified please.

## 2018-10-20 ENCOUNTER — Other Ambulatory Visit: Payer: Self-pay | Admitting: Family

## 2018-10-20 DIAGNOSIS — E1140 Type 2 diabetes mellitus with diabetic neuropathy, unspecified: Secondary | ICD-10-CM

## 2018-10-20 DIAGNOSIS — K219 Gastro-esophageal reflux disease without esophagitis: Secondary | ICD-10-CM

## 2018-10-21 ENCOUNTER — Other Ambulatory Visit: Payer: Self-pay | Admitting: Family

## 2018-10-21 DIAGNOSIS — I639 Cerebral infarction, unspecified: Secondary | ICD-10-CM

## 2018-10-21 MED ORDER — PANTOPRAZOLE SODIUM 40 MG PO TBEC: 40.0000 mg | DELAYED_RELEASE_TABLET | Freq: Two times a day (BID) | ORAL | 0 refills | Status: DC

## 2018-10-21 MED ORDER — CLOPIDOGREL BISULFATE 75 MG PO TABS: 75.0000 mg | ORAL_TABLET | Freq: Every day | ORAL | 1 refills | Status: DC

## 2018-10-21 NOTE — Telephone Encounter (Signed)
I called patient again today as well to try and reach her. Mailed out letter to contact office back so we could help address the things she was needing.

## 2018-10-21 NOTE — Telephone Encounter (Signed)
2nd message for her- since we are having such a hard time getting her to return our phone calls, please mail her a letter; insurance requested that I changed her Omeprazole to Protonix due to fewer interactions with her Plavix.

## 2018-11-07 NOTE — Telephone Encounter (Signed)
Pt is calling and she is aware carla is not in office today. Pt would like a callback today

## 2018-11-07 NOTE — Telephone Encounter (Signed)
lvm for pt to call back.  

## 2018-11-27 ENCOUNTER — Other Ambulatory Visit: Payer: Self-pay | Admitting: Family

## 2018-11-27 DIAGNOSIS — G5612 Other lesions of median nerve, left upper limb: Secondary | ICD-10-CM

## 2018-11-27 DIAGNOSIS — G5640 Causalgia of unspecified upper limb: Secondary | ICD-10-CM

## 2018-11-27 DIAGNOSIS — M25562 Pain in left knee: Secondary | ICD-10-CM

## 2018-11-27 DIAGNOSIS — G8929 Other chronic pain: Secondary | ICD-10-CM

## 2018-11-27 DIAGNOSIS — R471 Dysarthria and anarthria: Secondary | ICD-10-CM

## 2018-12-25 ENCOUNTER — Telehealth: Payer: Self-pay | Admitting: Family

## 2018-12-25 NOTE — Telephone Encounter (Signed)
Copied from [place] [id number]. Topic: General - Other >> Dec 25, 2018 12:25 PM Keene Breath wrote: Reason for CRM: Patient called to ask the nurse to call [employer] at [phone] to get a refill for her incontinence supplies.   Patient stated that they told her that it had to be verified from the doctor.  CB# [phone]

## 2018-12-26 NOTE — Telephone Encounter (Signed)
Yes, that is fine to order as requested.

## 2018-12-30 NOTE — Telephone Encounter (Signed)
Attempted to call number given today but weight time was over one hour/place in que 115. Requested to just have a call back and direct number left.

## 2019-01-13 ENCOUNTER — Ambulatory Visit: Payer: 59 | Admitting: Family

## 2019-01-19 ENCOUNTER — Ambulatory Visit: Payer: 59 | Admitting: Family

## 2019-01-26 ENCOUNTER — Ambulatory Visit (INDEPENDENT_AMBULATORY_CARE_PROVIDER_SITE_OTHER): Payer: 59 | Admitting: Family

## 2019-01-26 ENCOUNTER — Other Ambulatory Visit: Payer: Self-pay

## 2019-01-26 ENCOUNTER — Encounter: Payer: Self-pay | Admitting: Family

## 2019-01-26 VITALS — BP 142/78 | HR 80 | Temp 98.5°F | Ht 62.0 in | Wt 164.6 lb

## 2019-01-26 DIAGNOSIS — E119 Type 2 diabetes mellitus without complications: Secondary | ICD-10-CM | POA: Diagnosis not present

## 2019-01-26 DIAGNOSIS — I10 Essential (primary) hypertension: Secondary | ICD-10-CM | POA: Diagnosis not present

## 2019-01-26 DIAGNOSIS — E782 Mixed hyperlipidemia: Secondary | ICD-10-CM

## 2019-01-26 DIAGNOSIS — M79602 Pain in left arm: Secondary | ICD-10-CM

## 2019-01-26 DIAGNOSIS — R300 Dysuria: Secondary | ICD-10-CM | POA: Diagnosis not present

## 2019-01-26 DIAGNOSIS — G5643 Causalgia of bilateral upper limbs: Secondary | ICD-10-CM

## 2019-01-26 DIAGNOSIS — M79643 Pain in unspecified hand: Secondary | ICD-10-CM

## 2019-01-26 DIAGNOSIS — I639 Cerebral infarction, unspecified: Secondary | ICD-10-CM

## 2019-01-26 LAB — LIPID PANEL
Cholesterol: 184 mg/dL (ref 0–200)
HDL: 56.2 mg/dL (ref >39.00)
LDL Cholesterol: 107 mg/dL — ABNORMAL HIGH (ref 0–99)
NonHDL: 127.45
Total CHOL/HDL Ratio: 3
Triglycerides: 100 mg/dL (ref 0.0–149.0)
VLDL: 20 mg/dL (ref 0.0–40.0)

## 2019-01-26 LAB — CBC WITH DIFFERENTIAL/PLATELET
Basophils Absolute: 0 10*3/uL (ref 0.0–0.1)
Basophils Relative: 0.6 % (ref 0.0–3.0)
Eosinophils Absolute: 0.1 10*3/uL (ref 0.0–0.7)
Eosinophils Relative: 1.4 % (ref 0.0–5.0)
HCT: 40.5 % (ref 36.0–46.0)
Hemoglobin: 13.4 g/dL (ref 12.0–15.0)
Lymphocytes Relative: 25.4 % (ref 12.0–46.0)
Lymphs Abs: 2.1 10*3/uL (ref 0.7–4.0)
MCHC: 33.1 g/dL (ref 30.0–36.0)
MCV: 88.2 fl (ref 78.0–100.0)
Monocytes Absolute: 0.5 10*3/uL (ref 0.1–1.0)
Monocytes Relative: 6.4 % (ref 3.0–12.0)
Neutro Abs: 5.4 10*3/uL (ref 1.4–7.7)
Neutrophils Relative %: 66.2 % (ref 43.0–77.0)
Platelets: 362 10*3/uL (ref 150.0–400.0)
RBC: 4.59 Mil/uL (ref 3.87–5.11)
RDW: 13.1 % (ref 11.5–15.5)
WBC: 8.1 10*3/uL (ref 4.0–10.5)

## 2019-01-26 LAB — COMPREHENSIVE METABOLIC PANEL
ALT: 17 U/L (ref 0–35)
AST: 14 U/L (ref 0–37)
Albumin: 4.2 g/dL (ref 3.5–5.2)
Alkaline Phosphatase: 57 U/L (ref 39–117)
BUN: 9 mg/dL (ref 6–23)
CO2: 37 mEq/L — ABNORMAL HIGH (ref 19–32)
Calcium: 9.7 mg/dL (ref 8.4–10.5)
Chloride: 99 mEq/L (ref 96–112)
Creatinine, Ser: 0.71 mg/dL (ref 0.40–1.20)
GFR: 98.5 mL/min (ref >60.00)
Glucose, Bld: 98 mg/dL (ref 70–99)
Potassium: 3.5 mEq/L (ref 3.5–5.1)
Sodium: 142 mEq/L (ref 135–145)
Total Bilirubin: 0.6 mg/dL (ref 0.2–1.2)
Total Protein: 6.4 g/dL (ref 6.0–8.3)

## 2019-01-26 LAB — HEMOGLOBIN A1C: Hgb A1c MFr Bld: 6.3 % (ref 4.6–6.5)

## 2019-01-26 NOTE — Progress Notes (Signed)
Kara Mendoza is a 68 y.o. female with the following history as recorded in EpicCare:  Patient Active Problem List   Diagnosis Date Noted  . Dysuria 05/29/2018  . Residual cognitive deficit as late effect of stroke 04/26/2016  . Hemiparesis affecting left side as late effect of cerebrovascular accident (CVA) ([place]) 04/26/2016  . Carpal tunnel syndrome on left 11/29/2015  . DDD (degenerative disc disease), cervical 11/29/2015  . Depression 11/29/2015  . Chronic pain of left knee 09/04/2015  . Headache 09/04/2015  . Paroxysmal atrial fibrillation ([place]) 07/22/2015  . Complex regional pain syndrome type 2 of upper extremity 11/05/2013  . Carpal tunnel syndrome on right 10/22/2013  . Chronic embolism and thrombosis of deep vein of left upper extremity ([place]) 10/22/2013  . Cardiac device in situ 10/14/2013  . Essential hypertension 09/30/2013  . Complex regional pain syndrome type 1 of both upper extremities 09/30/2013  . Diabetes mellitus ([place]) 09/28/2013  . Hyperthyroidism 09/28/2013  . Median nerve neuritis 09/28/2013  . Ataxia, late effect of cerebrovascular disease 09/28/2013  . Spastic hemiplegia affecting dominant side ([place]) 09/28/2013  . CVA (cerebral vascular accident) ([place]) 08/15/2013  . Cerebral embolism with cerebral infarction ([place]) 08/09/2013  . Dysarthria 08/09/2013  . Abnormality of gait 05/01/2012  . Incontinence of urine 05/01/2012  . Tobacco abuse 01/28/2012  . Tobacco dependence syndrome 01/28/2012  . Plantar fasciitis 09/01/2010  . Allergic rhinitis 07/11/2010  . Persistent cough 06/16/2010  . Asthma night-time symptoms 06/14/2010  . GERD (gastroesophageal reflux disease) 05/26/2010  . Stroke ([place]) 05/26/2010  . Hyperlipidemia 05/26/2010  . Osteoarthritis 05/26/2010  . Mononeuritis 05/26/2010    Current Outpatient Medications  Medication Sig Dispense Refill  . acetaminophen (TYLENOL) 500 MG tablet Take 2 tablets (1,000 mg total) by mouth every 6 (six) hours  as needed. 30 tablet 0  . albuterol (PROVENTIL HFA;VENTOLIN HFA) 108 (90 Base) MCG/ACT inhaler Inhale 2 puffs into the lungs every 6 (six) hours as needed for wheezing or shortness of breath.    Marland Kitchen atorvastatin (LIPITOR) 40 MG tablet TAKE 1 TABLET BY MOUTH DAILY AT 6 PM 90 tablet 0  . clopidogrel (PLAVIX) 75 MG tablet Take 1 tablet (75 mg total) by mouth daily. 90 tablet 1  . diclofenac sodium (VOLTAREN) 1 % GEL Apply 2 g topically 4 (four) times daily. (Patient taking differently: Apply 2 g topically 4 (four) times daily as needed (pain). ) 1 Tube 0  . DULoxetine (CYMBALTA) 60 MG capsule TAKE 1 CAPSULE(60 MG) BY MOUTH DAILY 90 capsule 1  . fluticasone (FLONASE) 50 MCG/ACT nasal spray Place 1 spray into both nostrils daily. (Patient taking differently: Place 1 spray into both nostrils daily as needed for allergies. ) 16 g 6  . gabapentin (NEURONTIN) 300 MG capsule Take 1 capsule (300 mg total) by mouth 3 (three) times daily. 90 capsule 2  . Glucose Blood (BLOOD GLUCOSE TEST STRIPS) STRP Inject 1 Units into the skin 4 (four) times daily -  before meals and at bedtime. OneTouch Ultra mini strips (Patient taking differently: Inject 1 Units into the skin 4 (four) times daily -  before meals and at bedtime. OneTouch Ultra mini strips) 100 each 3  . losartan-hydrochlorothiazide (HYZAAR) 100-25 MG tablet Take 1 tablet by mouth daily. 90 tablet 0  . metFORMIN (GLUCOPHAGE) 500 MG tablet TAKE 1 TABLET(500 MG) BY MOUTH DAILY WITH BREAKFAST 90 tablet 0  . Multiple Vitamins-Calcium (ONE-A-DAY WOMENS FORMULA PO) Take 1 tablet by mouth daily.    Marland Kitchen omeprazole (  PRILOSEC) 20 MG capsule omeprazole 20 mg capsule,delayed release    . ONETOUCH DELICA LANCETS FINE MISC 1 Units by Does not apply route 4 (four) times daily -  before meals and at bedtime. (Patient taking differently: 1 Units by Does not apply route 4 (four) times daily -  before meals and at bedtime. ) 100 each 3  . zolpidem (AMBIEN) 5 MG tablet TAKE 1 TABLET(5  MG) BY MOUTH AT BEDTIME AS NEEDED FOR SLEEP 30 tablet 0  . montelukast (SINGULAIR) 10 MG tablet TAKE ONE TABLET BY MOUTH EVERY DAY IN THE EVENING FOR ALLERGIES    . potassium chloride SA (K-DUR,KLOR-CON) 20 MEQ tablet Take 1 tablet (20 mEq total) by mouth 2 (two) times daily. (Patient not taking: Reported on 01/26/2019) 60 tablet 0  . topiramate (TOPAMAX) 50 MG tablet TAKE ONE TABLET BY MOUTH TWICE DAILY FOR MIGRAINES    . topiramate (TOPAMAX) 50 MG tablet      No current facility-administered medications for this visit.    Allergies: Aspirin, Diclofenac, and Penicillins  Past Medical History:  Diagnosis Date  . Abnormality of gait   . Acute bronchitis   . Allergy   . Anxiety   . Anxiety state, unspecified   . Asthma   . Chronic arm pain 2008   left  . Chronic headache   . Chronic leg pain 2008   left  . Chronic pain syndrome   . Constipation   . Diabetes mellitus ([place])   . Dysarthria   . Esophageal reflux   . Fatigue   . History of fall   . Hyperlipidemia   . Hypertension   . Hyperthyroidism   . LOOP Recorder 10/14/2013  . Major depressive disorder, recurrent episode, moderate ([place])   . Memory loss   . Neuromuscular disorder ([place])   . Osteoarthritis   . Stroke ([place])   . Tobacco use disorder     Past Surgical History:  Procedure Laterality Date  . CESAREAN SECTION     x3  . FINGER SURGERY    . LOOP RECORDER IMPLANT  08-12-2013   MDT LINQ implanted by Dr Caryl Comes for cryptogenic stroke  . LOOP RECORDER IMPLANT N/A 08/12/2013   Procedure: LOOP RECORDER IMPLANT;  Surgeon: Deboraha Sprang, MD;  Location: [facility] CATH LAB;  Service: Cardiovascular;  Laterality: N/A;  . TEE WITHOUT CARDIOVERSION N/A 08/12/2013   Procedure: TRANSESOPHAGEAL ECHOCARDIOGRAM (TEE);  Surgeon: Josue Hector, MD;  Location: [facility] ENDOSCOPY;  Service: Cardiovascular;  Laterality: N/A;    Family History  Problem Relation Age of Onset  . Cancer Father   . Diabetes Sister   . Diabetes Brother     Social History    Tobacco Use  . Smoking status: Light Tobacco Smoker    Packs/day: 0.20    Years: 15.00    Pack years: 3.00    Types: Cigarettes  . Smokeless tobacco: Never Used  . Tobacco comment:  smoking 3-4 a day  Substance Use Topics  . Alcohol use: No    Alcohol/week: 0.0 standard drinks    Subjective:   Patient presents for a 6 month follow-up on chronic care needs including:  1) Hypertension; 2) Type 2 diabetes- no concerns for episodes of low blood sugar; Denies any chest pain, shortness of breath, blurred vision or headache; 3) GERD- stable on Protonix; 4) CPRS- since last OV, has been able to establish with pain management and actively working on getting her regimen updated;  Continues to have struggles with  vague, intermittent pain in her hands and shoulder- wants to see "someone" at [facility] or [facility] because "they have to be able to fix this." Has seen at least 2 or 3 orthopedists at [facility] in the past year- was not felt to be surgical candidate; patient disagrees which is why she wants to go to [facility] or [facility]- does not have transportation outside of [place];   In reviewing medications with patient, she is unable to explain some of the irregularities noted; there was a Telmisartan prescription in addition to her Losartan HCT. She did not know who or why she had Telmisartan on her list but was adamant that she was not taking the medication.   Objective:  Vitals:   01/26/19 1354  BP: (!) 142/78  Pulse: 80  Temp: 98.5 F (36.9 C)  TempSrc: Oral  SpO2: 98%  Weight: 164 lb 9.6 oz (74.7 kg)  Height: _0  (1.575 m)    General: Well developed, well nourished, in no acute distress  Skin : Warm and dry.  Head: Normocephalic and atraumatic  Lungs: Respirations unlabored; clear to auscultation bilaterally without wheeze, rales, rhonchi  CVS exam: normal rate and regular rhythm.  Neurologic: Alert and oriented; speech intact; face symmetrical; moves all extremities well;  CNII-XII intact without focal deficit   Assessment:  1. Essential hypertension   2. Type 2 diabetes mellitus without complication, without long-term current use of insulin ([place])   3. Dysuria   4. Mixed hyperlipidemia   5. Cerebrovascular accident (CVA), unspecified mechanism ([place])   6. Complex regional pain syndrome type 2 of both upper extremities   7. Pain of hand, unspecified laterality   8. Pain of left upper extremity     Plan:  1. Patient will continue Losartan hydrochlorothiazide; n reviewing medications with patient, she is unable to explain some of the irregularities noted; there was a Telmisartan prescription in addition to her Losartan HCT. She did not know who or why she had Telmisartan on her list but was adamant that she was not taking the medication.   Stressed to patient that from now on she is to bring her medications to her office visit for review; 2. Check Hgba1c today; 3. Check urine culture per patient request; 6. Continue with her pain management specialist; 7. & 8. Unfortunately, these are not new problems for this patient; she has been evaluated extensively by orthopedists at [facility] and was told she is not a surgical candidate. She is adamant that she will need to be seen at [facility] or [facility] and will work on getting transportation to one of these centers.   No follow-ups on file.  Orders Placed This Encounter  Procedures  . Urine Culture  . CBC w/Diff  . Comp Met (CMET)  . HgB A1c  . Lipid panel    Requested Prescriptions    No prescriptions requested or ordered in this encounter

## 2019-01-27 LAB — URINE CULTURE

## 2019-03-17 ENCOUNTER — Telehealth: Payer: Self-pay

## 2019-03-17 NOTE — Telephone Encounter (Signed)
Kara Mendoza with [employer] calling and states that forms for incontinent supplies was sent over and is awaiting a returned fax. States that she will fax over again. Fax number given.

## 2019-03-24 NOTE — Telephone Encounter (Signed)
F/u   The fax was received with no provider signature.   Refaxing document over to the office.   Office fax # given [phone].

## 2019-03-24 NOTE — Telephone Encounter (Signed)
Papers re faxed today. 

## 2019-05-04 ENCOUNTER — Ambulatory Visit: Payer: 59 | Admitting: Family

## 2019-05-12 ENCOUNTER — Ambulatory Visit (INDEPENDENT_AMBULATORY_CARE_PROVIDER_SITE_OTHER): Payer: 59 | Admitting: Family

## 2019-05-12 ENCOUNTER — Other Ambulatory Visit: Payer: Self-pay

## 2019-05-12 ENCOUNTER — Encounter: Payer: Self-pay | Admitting: Family

## 2019-05-12 VITALS — BP 150/74 | HR 79 | Temp 98.3°F | Ht 62.0 in | Wt 164.0 lb

## 2019-05-12 DIAGNOSIS — R350 Frequency of micturition: Secondary | ICD-10-CM | POA: Diagnosis not present

## 2019-05-12 DIAGNOSIS — K047 Periapical abscess without sinus: Secondary | ICD-10-CM | POA: Diagnosis not present

## 2019-05-12 DIAGNOSIS — E1142 Type 2 diabetes mellitus with diabetic polyneuropathy: Secondary | ICD-10-CM | POA: Diagnosis not present

## 2019-05-12 LAB — COMPREHENSIVE METABOLIC PANEL
ALT: 13 U/L (ref 0–35)
AST: 12 U/L (ref 0–37)
Albumin: 4.1 g/dL (ref 3.5–5.2)
Alkaline Phosphatase: 59 U/L (ref 39–117)
BUN: 8 mg/dL (ref 6–23)
CO2: 30 mEq/L (ref 19–32)
Calcium: 10.1 mg/dL (ref 8.4–10.5)
Chloride: 103 mEq/L (ref 96–112)
Creatinine, Ser: 0.78 mg/dL (ref 0.40–1.20)
GFR: 88.3 mL/min (ref >60.00)
Glucose, Bld: 110 mg/dL — ABNORMAL HIGH (ref 70–99)
Potassium: 4.2 mEq/L (ref 3.5–5.1)
Sodium: 138 mEq/L (ref 135–145)
Total Bilirubin: 0.4 mg/dL (ref 0.2–1.2)
Total Protein: 6.6 g/dL (ref 6.0–8.3)

## 2019-05-12 LAB — CBC WITH DIFFERENTIAL/PLATELET
Basophils Absolute: 0 10*3/uL (ref 0.0–0.1)
Basophils Relative: 0.6 % (ref 0.0–3.0)
Eosinophils Absolute: 0.1 10*3/uL (ref 0.0–0.7)
Eosinophils Relative: 1.6 % (ref 0.0–5.0)
HCT: 41.4 % (ref 36.0–46.0)
Hemoglobin: 13.9 g/dL (ref 12.0–15.0)
Lymphocytes Relative: 37 % (ref 12.0–46.0)
Lymphs Abs: 2.8 10*3/uL (ref 0.7–4.0)
MCHC: 33.7 g/dL (ref 30.0–36.0)
MCV: 89.2 fl (ref 78.0–100.0)
Monocytes Absolute: 0.5 10*3/uL (ref 0.1–1.0)
Monocytes Relative: 7.1 % (ref 3.0–12.0)
Neutro Abs: 4.1 10*3/uL (ref 1.4–7.7)
Neutrophils Relative %: 53.7 % (ref 43.0–77.0)
Platelets: 354 10*3/uL (ref 150.0–400.0)
RBC: 4.64 Mil/uL (ref 3.87–5.11)
RDW: 12.8 % (ref 11.5–15.5)
WBC: 7.7 10*3/uL (ref 4.0–10.5)

## 2019-05-12 LAB — HEMOGLOBIN A1C: Hgb A1c MFr Bld: 6.1 % (ref 4.6–6.5)

## 2019-05-12 MED ORDER — SULFAMETHOXAZOLE-TRIMETHOPRIM 800-160 MG PO TABS: 1.0000 | ORAL_TABLET | Freq: Two times a day (BID) | ORAL | 0 refills | Status: DC

## 2019-05-12 MED ORDER — DICLOFENAC SODIUM 1 % EX GEL: 2.0000 g | Freq: Four times a day (QID) | CUTANEOUS | 1 refills | Status: AC

## 2019-05-12 NOTE — Progress Notes (Signed)
Kara Mendoza is a 68 y.o. female with the following history as recorded in EpicCare:  Patient Active Problem List   Diagnosis Date Noted  . Dysuria 05/29/2018  . Residual cognitive deficit as late effect of stroke 04/26/2016  . Hemiparesis affecting left side as late effect of cerebrovascular accident (CVA) ([place]) 04/26/2016  . Carpal tunnel syndrome on left 11/29/2015  . DDD (degenerative disc disease), cervical 11/29/2015  . Depression 11/29/2015  . Chronic pain of left knee 09/04/2015  . Headache 09/04/2015  . Paroxysmal atrial fibrillation ([place]) 07/22/2015  . Complex regional pain syndrome type 2 of upper extremity 11/05/2013  . Carpal tunnel syndrome on right 10/22/2013  . Chronic embolism and thrombosis of deep vein of left upper extremity ([place]) 10/22/2013  . Cardiac device in situ 10/14/2013  . Essential hypertension 09/30/2013  . Complex regional pain syndrome type 1 of both upper extremities 09/30/2013  . Diabetes mellitus ([place]) 09/28/2013  . Hyperthyroidism 09/28/2013  . Median nerve neuritis 09/28/2013  . Ataxia, late effect of cerebrovascular disease 09/28/2013  . Spastic hemiplegia affecting dominant side ([place]) 09/28/2013  . CVA (cerebral vascular accident) ([place]) 08/15/2013  . Cerebral embolism with cerebral infarction ([place]) 08/09/2013  . Dysarthria 08/09/2013  . Abnormality of gait 05/01/2012  . Incontinence of urine 05/01/2012  . Tobacco abuse 01/28/2012  . Tobacco dependence syndrome 01/28/2012  . Plantar fasciitis 09/01/2010  . Allergic rhinitis 07/11/2010  . Persistent cough 06/16/2010  . Asthma night-time symptoms 06/14/2010  . GERD (gastroesophageal reflux disease) 05/26/2010  . Stroke ([place]) 05/26/2010  . Hyperlipidemia 05/26/2010  . Osteoarthritis 05/26/2010  . Mononeuritis 05/26/2010    Current Outpatient Medications  Medication Sig Dispense Refill  . acetaminophen (TYLENOL) 500 MG tablet Take 2 tablets (1,000 mg total) by mouth every 6 (six) hours  as needed. 30 tablet 0  . albuterol (PROVENTIL HFA;VENTOLIN HFA) 108 (90 Base) MCG/ACT inhaler Inhale 2 puffs into the lungs every 6 (six) hours as needed for wheezing or shortness of breath.    Marland Kitchen atorvastatin (LIPITOR) 40 MG tablet TAKE 1 TABLET BY MOUTH DAILY AT 6 PM 90 tablet 0  . clopidogrel (PLAVIX) 75 MG tablet Take 1 tablet (75 mg total) by mouth daily. 90 tablet 1  . DULoxetine (CYMBALTA) 60 MG capsule TAKE 1 CAPSULE(60 MG) BY MOUTH DAILY 90 capsule 1  . fluticasone (FLONASE) 50 MCG/ACT nasal spray Place 1 spray into both nostrils daily. (Patient taking differently: Place 1 spray into both nostrils daily as needed for allergies. ) 16 g 6  . gabapentin (NEURONTIN) 300 MG capsule Take 1 capsule (300 mg total) by mouth 3 (three) times daily. 90 capsule 2  . Glucose Blood (BLOOD GLUCOSE TEST STRIPS) STRP Inject 1 Units into the skin 4 (four) times daily -  before meals and at bedtime. OneTouch Ultra mini strips (Patient taking differently: Inject 1 Units into the skin 4 (four) times daily -  before meals and at bedtime. OneTouch Ultra mini strips) 100 each 3  . losartan-hydrochlorothiazide (HYZAAR) 100-25 MG tablet Take 1 tablet by mouth daily. 90 tablet 0  . metFORMIN (GLUCOPHAGE) 500 MG tablet TAKE 1 TABLET(500 MG) BY MOUTH DAILY WITH BREAKFAST 90 tablet 0  . Multiple Vitamins-Calcium (ONE-A-DAY WOMENS FORMULA PO) Take 1 tablet by mouth daily.    Marland Kitchen omeprazole (PRILOSEC) 20 MG capsule omeprazole 20 mg capsule,delayed release    . ONETOUCH DELICA LANCETS FINE MISC 1 Units by Does not apply route 4 (four) times daily -  before meals and  at bedtime. (Patient taking differently: 1 Units by Does not apply route 4 (four) times daily -  before meals and at bedtime. ) 100 each 3  . oxyCODONE-acetaminophen (PERCOCET) 10-325 MG tablet Take 1 tablet by mouth 3 (three) times daily.    Marland Kitchen topiramate (TOPAMAX) 50 MG tablet TAKE ONE TABLET BY MOUTH TWICE DAILY FOR MIGRAINES    . zolpidem (AMBIEN) 5 MG tablet  TAKE 1 TABLET(5 MG) BY MOUTH AT BEDTIME AS NEEDED FOR SLEEP 30 tablet 0  . diclofenac Sodium (VOLTAREN) 1 % GEL Apply 2 g topically 4 (four) times daily. 100 g 1  . sulfamethoxazole-trimethoprim (BACTRIM DS) 800-160 MG tablet Take 1 tablet by mouth 2 (two) times daily. 14 tablet 0   No current facility-administered medications for this visit.    Allergies: Aspirin, Diclofenac, and Penicillins  Past Medical History:  Diagnosis Date  . Abnormality of gait   . Acute bronchitis   . Allergy   . Anxiety   . Anxiety state, unspecified   . Asthma   . Chronic arm pain 2008   left  . Chronic headache   . Chronic leg pain 2008   left  . Chronic pain syndrome   . Constipation   . Diabetes mellitus ([place])   . Dysarthria   . Esophageal reflux   . Fatigue   . History of fall   . Hyperlipidemia   . Hypertension   . Hyperthyroidism   . LOOP Recorder 10/14/2013  . Major depressive disorder, recurrent episode, moderate ([place])   . Memory loss   . Neuromuscular disorder ([place])   . Osteoarthritis   . Stroke ([place])   . Tobacco use disorder     Past Surgical History:  Procedure Laterality Date  . CESAREAN SECTION     x3  . FINGER SURGERY    . LOOP RECORDER IMPLANT  08-12-2013   MDT LINQ implanted by Dr Caryl Comes for cryptogenic stroke  . LOOP RECORDER IMPLANT N/A 08/12/2013   Procedure: LOOP RECORDER IMPLANT;  Surgeon: Deboraha Sprang, MD;  Location: [facility] CATH LAB;  Service: Cardiovascular;  Laterality: N/A;  . TEE WITHOUT CARDIOVERSION N/A 08/12/2013   Procedure: TRANSESOPHAGEAL ECHOCARDIOGRAM (TEE);  Surgeon: Josue Hector, MD;  Location: [facility] ENDOSCOPY;  Service: Cardiovascular;  Laterality: N/A;    Family History  Problem Relation Age of Onset  . Cancer Father   . Diabetes Sister   . Diabetes Brother     Social History   Tobacco Use  . Smoking status: Light Tobacco Smoker    Packs/day: 0.20    Years: 15.00    Pack years: 3.00    Types: Cigarettes  . Smokeless tobacco: Never Used  . Tobacco  comment:  smoking 3-4 a day  Substance Use Topics  . Alcohol use: No    Alcohol/week: 0.0 standard drinks    Subjective:  Patient has been struggling with dental issues/ dental infection for the past 3 months; was told she needed to see an oral surgeon- was given recommendations to call and schedule. Is requesting antibiotics today to help with the pain/ swelling in her mouth;   Would like to get her labs updated for her diabetes today; feels that she has been urinating more frequently;     Objective:  Vitals:   05/12/19 1200  BP: (!) 150/74  Pulse: 79  Temp: 98.3 F (36.8 C)  TempSrc: Oral  SpO2: 96%  Weight: 164 lb (74.4 kg)  Height: _0  (1.575 m)  General: Well developed, well nourished, in no acute distress  Skin : Warm and dry.  Head: Normocephalic and atraumatic  Lungs: Respirations unlabored; clear to auscultation bilaterally without wheeze, rales, rhonchi  CVS exam: normal rate and regular rhythm.  Neurologic: Alert and oriented; speech intact; face symmetrical; moves all extremities well; CNII-XII intact without focal deficit   Assessment:  1. Type 2 diabetes mellitus with diabetic polyneuropathy, without long-term current use of insulin ([place])   2. Urinary frequency   3. Dental infection     Plan:  1. Update labs today; continue same medication; follow-up in 6 months; 2. Will check urine culture to rule out infection; 3. Rx for Bactrim DS bid x 7 days; she is encouraged to follow-up with a dentist to get area of concern in mouth treated.  This visit occurred during the SARS-CoV-2 public health emergency.  Safety protocols were in place, including screening questions prior to the visit, additional usage of staff PPE, and extensive cleaning of exam room while observing appropriate contact time as indicated for disinfecting solutions.     No follow-ups on file.  Orders Placed This Encounter  Procedures  . Urine Culture  . CBC with Differential/Platelet  .  Comp Met (CMET)  . HgB A1c    Requested Prescriptions   Signed Prescriptions Disp Refills  . sulfamethoxazole-trimethoprim (BACTRIM DS) 800-160 MG tablet 14 tablet 0    Sig: Take 1 tablet by mouth 2 (two) times daily.  . diclofenac Sodium (VOLTAREN) 1 % GEL 100 g 1    Sig: Apply 2 g topically 4 (four) times daily.

## 2019-05-13 ENCOUNTER — Telehealth: Payer: Self-pay | Admitting: Family

## 2019-05-13 LAB — URINE CULTURE: Result:: NO GROWTH

## 2019-05-13 NOTE — Telephone Encounter (Signed)
   [employer] calling to request prior auth for diclofenac Sodium (VOLTAREN) 1 % GEL Please call [phone] to obtain

## 2019-05-18 ENCOUNTER — Other Ambulatory Visit: Payer: Self-pay | Admitting: Family

## 2019-05-18 DIAGNOSIS — G5640 Causalgia of unspecified upper limb: Secondary | ICD-10-CM

## 2019-05-18 DIAGNOSIS — M25562 Pain in left knee: Secondary | ICD-10-CM

## 2019-05-18 DIAGNOSIS — G5612 Other lesions of median nerve, left upper limb: Secondary | ICD-10-CM

## 2019-05-18 DIAGNOSIS — R471 Dysarthria and anarthria: Secondary | ICD-10-CM

## 2019-05-18 DIAGNOSIS — G8929 Other chronic pain: Secondary | ICD-10-CM

## 2019-05-18 NOTE — Telephone Encounter (Signed)
Tried reaching patient today but unable to leave her a message as her voicemail isn't set up.

## 2019-05-18 NOTE — Telephone Encounter (Signed)
You can buy it OTC- I expect that is what she needs to do. If she can't afford it, then we can consider PA for her.

## 2019-05-25 ENCOUNTER — Other Ambulatory Visit: Payer: Self-pay | Admitting: Neurology

## 2019-05-25 ENCOUNTER — Telehealth: Payer: Self-pay

## 2019-05-25 ENCOUNTER — Other Ambulatory Visit: Payer: Self-pay | Admitting: Family

## 2019-05-25 DIAGNOSIS — I10 Essential (primary) hypertension: Secondary | ICD-10-CM

## 2019-05-25 DIAGNOSIS — F339 Major depressive disorder, recurrent, unspecified: Secondary | ICD-10-CM

## 2019-05-25 DIAGNOSIS — G5643 Causalgia of bilateral upper limbs: Secondary | ICD-10-CM

## 2019-05-25 NOTE — Telephone Encounter (Signed)
New message    Need prior authorization  diclofenac Sodium (VOLTAREN) 1 % GEL.  This medication is not cover over the counter   Prior authorization phone  # [phone]  Fax # [phone]

## 2019-05-26 NOTE — Telephone Encounter (Signed)
At prior visit in 08/2018, she plans on establishing care with pain management provider and advised ongoing pain management and prescribing will be through their office.  Request should be placed to either pain management or PCP.

## 2019-06-01 ENCOUNTER — Other Ambulatory Visit: Payer: Self-pay | Admitting: Family

## 2019-06-01 DIAGNOSIS — G5643 Causalgia of bilateral upper limbs: Secondary | ICD-10-CM

## 2019-06-01 DIAGNOSIS — F339 Major depressive disorder, recurrent, unspecified: Secondary | ICD-10-CM

## 2019-06-03 NOTE — Telephone Encounter (Signed)
Called and left message for patient that she could purchase gel over the counter.

## 2019-06-05 ENCOUNTER — Telehealth: Payer: Self-pay | Admitting: Family

## 2019-06-05 NOTE — Telephone Encounter (Signed)
New Message:   Kara Mendoza is calling from [facility] and it needing a copy of the patients A1c from 01/26/19 for their records. She states if possible can you please send it by end of day today. She also states she will bring you doughnuts next week lol. Fax [phone]. Please advise.

## 2019-06-08 NOTE — Telephone Encounter (Signed)
Kara Mendoza is calling to follow up on this request. I have printed and faxed the A1C results as request.

## 2019-06-22 DIAGNOSIS — Z79899 Other long term (current) drug therapy: Secondary | ICD-10-CM | POA: Diagnosis not present

## 2019-06-22 DIAGNOSIS — Z79891 Long term (current) use of opiate analgesic: Secondary | ICD-10-CM | POA: Diagnosis not present

## 2019-06-22 DIAGNOSIS — M189 Osteoarthritis of first carpometacarpal joint, unspecified: Secondary | ICD-10-CM | POA: Diagnosis not present

## 2019-06-22 DIAGNOSIS — G90519 Complex regional pain syndrome I of unspecified upper limb: Secondary | ICD-10-CM | POA: Diagnosis not present

## 2019-06-22 DIAGNOSIS — G5602 Carpal tunnel syndrome, left upper limb: Secondary | ICD-10-CM | POA: Diagnosis not present

## 2019-06-22 DIAGNOSIS — G894 Chronic pain syndrome: Secondary | ICD-10-CM | POA: Diagnosis not present

## 2019-07-09 ENCOUNTER — Telehealth: Payer: Self-pay | Admitting: Family

## 2019-07-09 NOTE — Telephone Encounter (Addendum)
Patient last seen 05/12/19. [employer] and patient would like to know when she should schedule follow up visit. Call ended before information could be given. Office visit note states to follow up in 6 months.  Please update phone number for patient. Schedule 6 month follow up

## 2019-07-24 DIAGNOSIS — G5602 Carpal tunnel syndrome, left upper limb: Secondary | ICD-10-CM | POA: Diagnosis not present

## 2019-07-24 DIAGNOSIS — M189 Osteoarthritis of first carpometacarpal joint, unspecified: Secondary | ICD-10-CM | POA: Diagnosis not present

## 2019-07-24 DIAGNOSIS — G90519 Complex regional pain syndrome I of unspecified upper limb: Secondary | ICD-10-CM | POA: Diagnosis not present

## 2019-07-24 DIAGNOSIS — G894 Chronic pain syndrome: Secondary | ICD-10-CM | POA: Diagnosis not present

## 2019-08-21 DIAGNOSIS — G5602 Carpal tunnel syndrome, left upper limb: Secondary | ICD-10-CM | POA: Diagnosis not present

## 2019-08-21 DIAGNOSIS — G894 Chronic pain syndrome: Secondary | ICD-10-CM | POA: Diagnosis not present

## 2019-08-21 DIAGNOSIS — G90519 Complex regional pain syndrome I of unspecified upper limb: Secondary | ICD-10-CM | POA: Diagnosis not present

## 2019-08-21 DIAGNOSIS — M189 Osteoarthritis of first carpometacarpal joint, unspecified: Secondary | ICD-10-CM | POA: Diagnosis not present

## 2019-09-09 DIAGNOSIS — G894 Chronic pain syndrome: Secondary | ICD-10-CM | POA: Diagnosis not present

## 2019-09-09 DIAGNOSIS — M792 Neuralgia and neuritis, unspecified: Secondary | ICD-10-CM | POA: Diagnosis not present

## 2019-09-24 DIAGNOSIS — G90519 Complex regional pain syndrome I of unspecified upper limb: Secondary | ICD-10-CM | POA: Diagnosis not present

## 2019-09-24 DIAGNOSIS — Z79891 Long term (current) use of opiate analgesic: Secondary | ICD-10-CM | POA: Diagnosis not present

## 2019-09-24 DIAGNOSIS — M189 Osteoarthritis of first carpometacarpal joint, unspecified: Secondary | ICD-10-CM | POA: Diagnosis not present

## 2019-09-24 DIAGNOSIS — Z79899 Other long term (current) drug therapy: Secondary | ICD-10-CM | POA: Diagnosis not present

## 2019-09-24 DIAGNOSIS — G894 Chronic pain syndrome: Secondary | ICD-10-CM | POA: Diagnosis not present

## 2019-09-24 DIAGNOSIS — G5602 Carpal tunnel syndrome, left upper limb: Secondary | ICD-10-CM | POA: Diagnosis not present

## 2019-10-22 DIAGNOSIS — G90519 Complex regional pain syndrome I of unspecified upper limb: Secondary | ICD-10-CM | POA: Diagnosis not present

## 2019-10-22 DIAGNOSIS — M189 Osteoarthritis of first carpometacarpal joint, unspecified: Secondary | ICD-10-CM | POA: Diagnosis not present

## 2019-10-22 DIAGNOSIS — G894 Chronic pain syndrome: Secondary | ICD-10-CM | POA: Diagnosis not present

## 2019-10-22 DIAGNOSIS — G5602 Carpal tunnel syndrome, left upper limb: Secondary | ICD-10-CM | POA: Diagnosis not present

## 2019-10-28 ENCOUNTER — Telehealth: Payer: Self-pay | Admitting: Family

## 2019-10-28 NOTE — Telephone Encounter (Signed)
   Patient would like to know if she has any medical restrictions that would cause her not to be able to take a flight to [place]

## 2019-10-28 NOTE — Telephone Encounter (Signed)
Patient call returned no answer and voicemail was not available.

## 2019-11-19 DIAGNOSIS — M189 Osteoarthritis of first carpometacarpal joint, unspecified: Secondary | ICD-10-CM | POA: Diagnosis not present

## 2019-11-19 DIAGNOSIS — G894 Chronic pain syndrome: Secondary | ICD-10-CM | POA: Diagnosis not present

## 2019-11-19 DIAGNOSIS — G90519 Complex regional pain syndrome I of unspecified upper limb: Secondary | ICD-10-CM | POA: Diagnosis not present

## 2019-11-19 DIAGNOSIS — G5602 Carpal tunnel syndrome, left upper limb: Secondary | ICD-10-CM | POA: Diagnosis not present

## 2019-11-25 ENCOUNTER — Other Ambulatory Visit: Payer: Self-pay

## 2019-11-25 ENCOUNTER — Ambulatory Visit (INDEPENDENT_AMBULATORY_CARE_PROVIDER_SITE_OTHER): Payer: Medicare Other | Admitting: Family

## 2019-11-25 ENCOUNTER — Encounter: Payer: Self-pay | Admitting: Family

## 2019-11-25 VITALS — BP 100/60 | HR 84 | Temp 98.5°F | Ht 62.0 in | Wt 163.8 lb

## 2019-11-25 DIAGNOSIS — R519 Headache, unspecified: Secondary | ICD-10-CM | POA: Diagnosis not present

## 2019-11-25 DIAGNOSIS — Z959 Presence of cardiac and vascular implant and graft, unspecified: Secondary | ICD-10-CM

## 2019-11-25 DIAGNOSIS — E1142 Type 2 diabetes mellitus with diabetic polyneuropathy: Secondary | ICD-10-CM | POA: Diagnosis not present

## 2019-11-25 DIAGNOSIS — R5383 Other fatigue: Secondary | ICD-10-CM

## 2019-11-25 DIAGNOSIS — H538 Other visual disturbances: Secondary | ICD-10-CM | POA: Diagnosis not present

## 2019-11-25 DIAGNOSIS — G8929 Other chronic pain: Secondary | ICD-10-CM

## 2019-11-25 DIAGNOSIS — Z1211 Encounter for screening for malignant neoplasm of colon: Secondary | ICD-10-CM

## 2019-11-25 DIAGNOSIS — Z1231 Encounter for screening mammogram for malignant neoplasm of breast: Secondary | ICD-10-CM | POA: Diagnosis not present

## 2019-11-25 DIAGNOSIS — L309 Dermatitis, unspecified: Secondary | ICD-10-CM

## 2019-11-25 DIAGNOSIS — E0590 Thyrotoxicosis, unspecified without thyrotoxic crisis or storm: Secondary | ICD-10-CM

## 2019-11-25 LAB — LIPID PANEL
Cholesterol: 169 mg/dL (ref 0–200)
HDL: 48.2 mg/dL (ref >39.00)
LDL Cholesterol: 106 mg/dL — ABNORMAL HIGH (ref 0–99)
NonHDL: 120.81
Total CHOL/HDL Ratio: 4
Triglycerides: 74 mg/dL (ref 0.0–149.0)
VLDL: 14.8 mg/dL (ref 0.0–40.0)

## 2019-11-25 LAB — COMPREHENSIVE METABOLIC PANEL
ALT: 16 U/L (ref 0–35)
AST: 15 U/L (ref 0–37)
Albumin: 4.2 g/dL (ref 3.5–5.2)
Alkaline Phosphatase: 58 U/L (ref 39–117)
BUN: 9 mg/dL (ref 6–23)
CO2: 34 mEq/L — ABNORMAL HIGH (ref 19–32)
Calcium: 10.6 mg/dL — ABNORMAL HIGH (ref 8.4–10.5)
Chloride: 99 mEq/L (ref 96–112)
Creatinine, Ser: 0.84 mg/dL (ref 0.40–1.20)
GFR: 70.06 mL/min (ref >60.00)
Glucose, Bld: 155 mg/dL — ABNORMAL HIGH (ref 70–99)
Potassium: 3.5 mEq/L (ref 3.5–5.1)
Sodium: 140 mEq/L (ref 135–145)
Total Bilirubin: 0.5 mg/dL (ref 0.2–1.2)
Total Protein: 6.6 g/dL (ref 6.0–8.3)

## 2019-11-25 LAB — CBC WITH DIFFERENTIAL/PLATELET
Basophils Absolute: 0.1 10*3/uL (ref 0.0–0.1)
Basophils Relative: 1.1 % (ref 0.0–3.0)
Eosinophils Absolute: 0.1 10*3/uL (ref 0.0–0.7)
Eosinophils Relative: 2 % (ref 0.0–5.0)
HCT: 43 % (ref 36.0–46.0)
Hemoglobin: 14.7 g/dL (ref 12.0–15.0)
Lymphocytes Relative: 32.7 % (ref 12.0–46.0)
Lymphs Abs: 2.3 10*3/uL (ref 0.7–4.0)
MCHC: 34.3 g/dL (ref 30.0–36.0)
MCV: 88.5 fl (ref 78.0–100.0)
Monocytes Absolute: 0.5 10*3/uL (ref 0.1–1.0)
Monocytes Relative: 6.6 % (ref 3.0–12.0)
Neutro Abs: 4 10*3/uL (ref 1.4–7.7)
Neutrophils Relative %: 57.6 % (ref 43.0–77.0)
Platelets: 344 10*3/uL (ref 150.0–400.0)
RBC: 4.85 Mil/uL (ref 3.87–5.11)
RDW: 12.5 % (ref 11.5–15.5)
WBC: 7 10*3/uL (ref 4.0–10.5)

## 2019-11-25 LAB — TSH: TSH: 1.24 u[IU]/mL (ref 0.35–4.50)

## 2019-11-25 LAB — HEMOGLOBIN A1C: Hgb A1c MFr Bld: 6.6 % — ABNORMAL HIGH (ref 4.6–6.5)

## 2019-11-25 MED ORDER — TRIAMCINOLONE ACETONIDE 0.1 % EX CREA: 1.0000 "application " | TOPICAL_CREAM | Freq: Two times a day (BID) | CUTANEOUS | 0 refills | Status: DC

## 2019-11-25 NOTE — Progress Notes (Signed)
Kara Mendoza is a 69 y.o. female with the following history as recorded in EpicCare:  Patient Active Problem List   Diagnosis Date Noted  . Dysuria 05/29/2018  . Residual cognitive deficit as late effect of stroke 04/26/2016  . Hemiparesis affecting left side as late effect of cerebrovascular accident (CVA) ([place]) 04/26/2016  . Carpal tunnel syndrome on left 11/29/2015  . DDD (degenerative disc disease), cervical 11/29/2015  . Depression 11/29/2015  . Chronic pain of left knee 09/04/2015  . Headache 09/04/2015  . Paroxysmal atrial fibrillation ([place]) 07/22/2015  . Complex regional pain syndrome type 2 of upper extremity 11/05/2013  . Carpal tunnel syndrome on right 10/22/2013  . Chronic embolism and thrombosis of deep vein of left upper extremity ([place]) 10/22/2013  . Cardiac device in situ 10/14/2013  . Essential hypertension 09/30/2013  . Complex regional pain syndrome type 1 of both upper extremities 09/30/2013  . Diabetes mellitus ([place]) 09/28/2013  . Hyperthyroidism 09/28/2013  . Median nerve neuritis 09/28/2013  . Ataxia, late effect of cerebrovascular disease 09/28/2013  . Spastic hemiplegia affecting dominant side ([place]) 09/28/2013  . CVA (cerebral vascular accident) ([place]) 08/15/2013  . Cerebral embolism with cerebral infarction ([place]) 08/09/2013  . Dysarthria 08/09/2013  . Abnormality of gait 05/01/2012  . Incontinence of urine 05/01/2012  . Tobacco abuse 01/28/2012  . Tobacco dependence syndrome 01/28/2012  . Plantar fasciitis 09/01/2010  . Allergic rhinitis 07/11/2010  . Persistent cough 06/16/2010  . Asthma night-time symptoms 06/14/2010  . GERD (gastroesophageal reflux disease) 05/26/2010  . Stroke ([place]) 05/26/2010  . Hyperlipidemia 05/26/2010  . Osteoarthritis 05/26/2010  . Mononeuritis 05/26/2010    Current Outpatient Medications  Medication Sig Dispense Refill  . acetaminophen (TYLENOL) 500 MG tablet Take 2 tablets (1,000 mg total) by mouth every 6 (six) hours  as needed. 30 tablet 0  . albuterol (PROVENTIL HFA;VENTOLIN HFA) 108 (90 Base) MCG/ACT inhaler Inhale 2 puffs into the lungs every 6 (six) hours as needed for wheezing or shortness of breath.    Marland Kitchen atorvastatin (LIPITOR) 40 MG tablet TAKE 1 TABLET BY MOUTH DAILY AT 6 PM 90 tablet 0  . clopidogrel (PLAVIX) 75 MG tablet Take 1 tablet (75 mg total) by mouth daily. 90 tablet 1  . diclofenac Sodium (VOLTAREN) 1 % GEL Apply 2 g topically 4 (four) times daily. 100 g 1  . DULoxetine (CYMBALTA) 60 MG capsule TAKE 1 CAPSULE(60 MG) BY MOUTH DAILY 90 capsule 1  . fluticasone (FLONASE) 50 MCG/ACT nasal spray Place 1 spray into both nostrils daily. (Patient taking differently: Place 1 spray into both nostrils daily as needed for allergies. ) 16 g 6  . gabapentin (NEURONTIN) 300 MG capsule TAKE 1 CAPSULE(300 MG) BY MOUTH THREE TIMES DAILY 90 capsule 2  . Glucose Blood (BLOOD GLUCOSE TEST STRIPS) STRP Inject 1 Units into the skin 4 (four) times daily -  before meals and at bedtime. OneTouch Ultra mini strips (Patient taking differently: Inject 1 Units into the skin 4 (four) times daily -  before meals and at bedtime. OneTouch Ultra mini strips) 100 each 3  . losartan-hydrochlorothiazide (HYZAAR) 100-25 MG tablet TAKE 1 TABLET BY MOUTH DAILY 90 tablet 3  . metFORMIN (GLUCOPHAGE) 500 MG tablet TAKE 1 TABLET(500 MG) BY MOUTH DAILY WITH BREAKFAST 90 tablet 0  . Multiple Vitamins-Calcium (ONE-A-DAY WOMENS FORMULA PO) Take 1 tablet by mouth daily.    Marland Kitchen omeprazole (PRILOSEC) 20 MG capsule omeprazole 20 mg capsule,delayed release    . [place]  1 Units by Does not apply route 4 (four) times daily -  before meals and at bedtime. (Patient taking differently: 1 Units by Does not apply route 4 (four) times daily -  before meals and at bedtime. ) 100 each 3  . oxyCODONE-acetaminophen (PERCOCET) 10-325 MG tablet Take 1 tablet by mouth 3 (three) times daily.    Marland Kitchen zolpidem (AMBIEN) 5 MG tablet TAKE 1 TABLET(5  MG) BY MOUTH AT BEDTIME AS NEEDED FOR SLEEP 30 tablet 0  . topiramate (TOPAMAX) 50 MG tablet TAKE ONE TABLET BY MOUTH TWICE DAILY FOR MIGRAINES (Patient not taking: Reported on 11/25/2019)    . triamcinolone cream (KENALOG) 0.1 % Apply 1 application topically 2 (two) times daily. 45 g 0   No current facility-administered medications for this visit.    Allergies: Aspirin, Diclofenac, and Penicillins  Past Medical History:  Diagnosis Date  . Abnormality of gait   . Acute bronchitis   . Allergy   . Anxiety   . Anxiety state, unspecified   . Asthma   . Chronic arm pain 2008   left  . Chronic headache   . Chronic leg pain 2008   left  . Chronic pain syndrome   . Constipation   . Diabetes mellitus ([place])   . Dysarthria   . Esophageal reflux   . Fatigue   . History of fall   . Hyperlipidemia   . Hypertension   . Hyperthyroidism   . LOOP Recorder 10/14/2013  . Major depressive disorder, recurrent episode, moderate ([place])   . Memory loss   . Neuromuscular disorder ([place])   . Osteoarthritis   . Stroke ([place])   . Tobacco use disorder     Past Surgical History:  Procedure Laterality Date  . CESAREAN SECTION     x3  . FINGER SURGERY    . LOOP RECORDER IMPLANT  08-12-2013   MDT LINQ implanted by Dr Caryl Comes for cryptogenic stroke  . LOOP RECORDER IMPLANT N/A 08/12/2013   Procedure: LOOP RECORDER IMPLANT;  Surgeon: Deboraha Sprang, MD;  [facility] C[facility] LAB;  Service: Cardiovascular;  Laterality: N/A;  . TEE WITHOUT CARDIOVERSION N/A 08/12/2013   Procedure: TRANSESOPHAGEAL ECHOCARDIOGRAM (TEE);  Surgeon: Josue Hector, MD;  Location: [facility] ENDOSCOPY;  Service: Cardiovascular;  Laterality: N/A;    Family History  Problem Relation Age of Onset  . Cancer Father   . Diabetes Sister   . Diabetes Brother     Social History   Tobacco Use  . Smoking status: Light Tobacco Smoker    Packs/day: 0.20    Years: 15.00    Pack years: 3.00    Types: Cigarettes  . Smokeless tobacco: Never Used  .  Tobacco comment:  smoking 3-4 a day  Substance Use Topics  . Alcohol use: No    Alcohol/week: 0.0 standard drinks    Subjective:  Patient presents with concerns for worsening headaches/ vision changes x 2 months; "feels like my vision is not clear." Has not been to eye doctor in at least 1 year;  Poor historian secondary to complications from stroke- ? If she has active ICD in place; history of A. Fib in reviewing notes but she is adamant today that she does not see cardiology/ does not want to see cardiology because they "put a hole in my heart."     Objective:  Vitals:   11/25/19 1127  BP: 100/60  Pulse: 84  Temp: 98.5 F (36.9 C)  TempSrc: Oral  SpO2: 97%  Weight: 163 lb 12.8 oz (74.3 kg)  Height: _0  (1.575 m)    General: Well developed, well nourished, in no acute distress  Skin : Warm and dry.  Head: Normocephalic and atraumatic  Eyes: Sclera and conjunctiva clear; pupils round and reactive to light; extraocular movements intact  Ears: External normal; canals clear; tympanic membranes normal  Oropharynx: Pink, supple. No suspicious lesions  Neck: Supple without thyromegaly, adenopathy  Lungs: Respirations unlabored; clear to auscultation bilaterally without wheeze, rales, rhonchi  CVS exam: normal rate and regular rhythm.  Abdomen: Soft; nontender; nondistended; normoactive bowel sounds; no masses or hepatosplenomegaly  Musculoskeletal: No deformities; no active joint inflammation  Extremities: No edema, cyanosis, clubbing  Vessels: Symmetric bilaterally  Neurologic: Alert and oriented; speech intact; face symmetrical; moves all extremities well; CNII-XII intact without focal deficit  Assessment:  1. Type 2 diabetes mellitus with diabetic polyneuropathy, without long-term current use of insulin (HCC)   2. Other fatigue   3. Screening mammogram for breast cancer   4. Chronic nonintractable headache, unspecified headache type   5. Blurred vision   6. Colon cancer  screening   7. Cardiac device in situ   8. Hyperthyroidism     Plan:   1. Update labs today; 2. Will check TSH today; records indicate history of hyperthyroidism but not currently taking any medication; 3. Order updated; 4. Not a new issue; is not currently taking Topamax- she is not sure who was prescribing for her but notes that someone told her to stop; will only order CT as I am not sure status of ICD; ( she refuses to see cardiology); will refer back to her neurologist; 5. Refer to ophthalmology; 6. Order for Cologuard updated; 7. Rx for Triamcinolone Cream apply bid to affected area;  This visit occurred during the SARS-CoV-2 public health emergency.  Safety protocols were in place, including screening questions prior to the visit, additional usage of staff PPE, and extensive cleaning of exam room while observing appropriate contact time as indicated for disinfecting solutions.      No follow-ups on file.  Orders Placed This Encounter  Procedures  . MM Digital Screening    Standing Status:   Future    Standing Expiration Date:   11/24/2020    Order Specific Question:   Reason for Exam (SYMPTOM  OR DIAGNOSIS REQUIRED)    Answer:   screening mammogram    Order Specific Question:   Preferred imaging location?    Answer:   [facility]  . CT Head Wo Contrast    Standing Status:   Future    Standing Expiration Date:   11/24/2020    Order Specific Question:   Preferred imaging location?    Answer:   [facility]  . CBC with Differential/Platelet    Standing Status:   Future    Number of Occurrences:   1    Standing Expiration Date:   11/24/2020  . Comp Met (CMET)    Standing Status:   Future    Number of Occurrences:   1    Standing Expiration Date:   11/24/2020  . Lipid panel    Standing Status:   Future    Number of Occurrences:   1    Standing Expiration Date:   11/24/2020  . Hemoglobin A1c    Standing Status:   Future    Number of Occurrences:   1     Standing Expiration Date:   11/24/2020  . TSH  Standing Status:   Future    Number of Occurrences:   1    Standing Expiration Date:   11/24/2020  . Cologuard  . Ambulatory referral to Ophthalmology    Referral Priority:   Routine    Referral Type:   Consultation    Referral Reason:   Specialty Services Required    Requested Specialty:   Ophthalmology    Number of Visits Requested:   1  . Ambulatory referral to Neurology    Referral Priority:   Routine    Referral Type:   Consultation    Referral Reason:   Specialty Services Required    Requested Specialty:   Neurology    Number of Visits Requested:   1    Requested Prescriptions   Signed Prescriptions Disp Refills  . triamcinolone cream (KENALOG) 0.1 % 45 g 0    Sig: Apply 1 application topically 2 (two) times daily.

## 2019-11-27 ENCOUNTER — Encounter: Payer: Self-pay | Admitting: Family

## 2019-12-01 DIAGNOSIS — Z1211 Encounter for screening for malignant neoplasm of colon: Secondary | ICD-10-CM | POA: Diagnosis not present

## 2019-12-01 LAB — COLOGUARD: Cologuard: NEGATIVE

## 2019-12-17 DIAGNOSIS — Z79891 Long term (current) use of opiate analgesic: Secondary | ICD-10-CM | POA: Diagnosis not present

## 2019-12-17 DIAGNOSIS — G5602 Carpal tunnel syndrome, left upper limb: Secondary | ICD-10-CM | POA: Diagnosis not present

## 2019-12-17 DIAGNOSIS — M189 Osteoarthritis of first carpometacarpal joint, unspecified: Secondary | ICD-10-CM | POA: Diagnosis not present

## 2019-12-17 DIAGNOSIS — Z79899 Other long term (current) drug therapy: Secondary | ICD-10-CM | POA: Diagnosis not present

## 2019-12-17 DIAGNOSIS — G90519 Complex regional pain syndrome I of unspecified upper limb: Secondary | ICD-10-CM | POA: Diagnosis not present

## 2019-12-17 DIAGNOSIS — G894 Chronic pain syndrome: Secondary | ICD-10-CM | POA: Diagnosis not present

## 2019-12-18 LAB — COLOGUARD: COLOGUARD: NEGATIVE

## 2019-12-20 ENCOUNTER — Other Ambulatory Visit: Payer: Self-pay | Admitting: Family

## 2019-12-20 DIAGNOSIS — F339 Major depressive disorder, recurrent, unspecified: Secondary | ICD-10-CM

## 2019-12-20 DIAGNOSIS — G5643 Causalgia of bilateral upper limbs: Secondary | ICD-10-CM

## 2019-12-24 ENCOUNTER — Encounter: Payer: Self-pay | Admitting: Family

## 2019-12-25 ENCOUNTER — Telehealth: Payer: Self-pay

## 2019-12-25 NOTE — Telephone Encounter (Signed)
Called patient to go over negative cologuard result, patient no reached nor was voicemail left.

## 2020-01-14 DIAGNOSIS — G5602 Carpal tunnel syndrome, left upper limb: Secondary | ICD-10-CM | POA: Diagnosis not present

## 2020-01-14 DIAGNOSIS — M189 Osteoarthritis of first carpometacarpal joint, unspecified: Secondary | ICD-10-CM | POA: Diagnosis not present

## 2020-01-14 DIAGNOSIS — G90519 Complex regional pain syndrome I of unspecified upper limb: Secondary | ICD-10-CM | POA: Diagnosis not present

## 2020-01-14 DIAGNOSIS — G894 Chronic pain syndrome: Secondary | ICD-10-CM | POA: Diagnosis not present

## 2020-02-03 ENCOUNTER — Other Ambulatory Visit: Payer: Self-pay | Admitting: Family

## 2020-02-03 DIAGNOSIS — I639 Cerebral infarction, unspecified: Secondary | ICD-10-CM

## 2020-02-18 DIAGNOSIS — G894 Chronic pain syndrome: Secondary | ICD-10-CM | POA: Diagnosis not present

## 2020-02-18 DIAGNOSIS — M189 Osteoarthritis of first carpometacarpal joint, unspecified: Secondary | ICD-10-CM | POA: Diagnosis not present

## 2020-02-18 DIAGNOSIS — G5602 Carpal tunnel syndrome, left upper limb: Secondary | ICD-10-CM | POA: Diagnosis not present

## 2020-02-18 DIAGNOSIS — G90519 Complex regional pain syndrome I of unspecified upper limb: Secondary | ICD-10-CM | POA: Diagnosis not present

## 2020-03-03 ENCOUNTER — Other Ambulatory Visit: Payer: Self-pay | Admitting: Family

## 2020-03-03 DIAGNOSIS — E782 Mixed hyperlipidemia: Secondary | ICD-10-CM

## 2020-03-16 DIAGNOSIS — M79602 Pain in left arm: Secondary | ICD-10-CM | POA: Diagnosis not present

## 2020-03-16 DIAGNOSIS — M79603 Pain in arm, unspecified: Secondary | ICD-10-CM | POA: Diagnosis not present

## 2020-03-16 DIAGNOSIS — G90519 Complex regional pain syndrome I of unspecified upper limb: Secondary | ICD-10-CM | POA: Diagnosis not present

## 2020-03-16 DIAGNOSIS — G894 Chronic pain syndrome: Secondary | ICD-10-CM | POA: Diagnosis not present

## 2020-03-16 DIAGNOSIS — Z79899 Other long term (current) drug therapy: Secondary | ICD-10-CM | POA: Diagnosis not present

## 2020-03-16 DIAGNOSIS — Z79891 Long term (current) use of opiate analgesic: Secondary | ICD-10-CM | POA: Diagnosis not present

## 2020-03-18 ENCOUNTER — Other Ambulatory Visit: Payer: Self-pay | Admitting: Family

## 2020-03-18 DIAGNOSIS — E1140 Type 2 diabetes mellitus with diabetic neuropathy, unspecified: Secondary | ICD-10-CM

## 2020-03-21 ENCOUNTER — Ambulatory Visit: Payer: Medicare Other | Admitting: Family

## 2020-03-25 ENCOUNTER — Ambulatory Visit: Payer: Medicare Other | Admitting: Family

## 2020-03-25 ENCOUNTER — Other Ambulatory Visit: Payer: Self-pay

## 2020-04-01 ENCOUNTER — Other Ambulatory Visit: Payer: Self-pay

## 2020-04-01 ENCOUNTER — Encounter: Payer: Self-pay | Admitting: Family

## 2020-04-01 ENCOUNTER — Ambulatory Visit (INDEPENDENT_AMBULATORY_CARE_PROVIDER_SITE_OTHER): Payer: Medicare Other | Admitting: Family

## 2020-04-01 VITALS — BP 128/92 | HR 98 | Temp 98.4°F

## 2020-04-01 DIAGNOSIS — E1142 Type 2 diabetes mellitus with diabetic polyneuropathy: Secondary | ICD-10-CM

## 2020-04-01 DIAGNOSIS — M79641 Pain in right hand: Secondary | ICD-10-CM

## 2020-04-01 DIAGNOSIS — R350 Frequency of micturition: Secondary | ICD-10-CM

## 2020-04-01 DIAGNOSIS — M79642 Pain in left hand: Secondary | ICD-10-CM

## 2020-04-01 DIAGNOSIS — R202 Paresthesia of skin: Secondary | ICD-10-CM | POA: Diagnosis not present

## 2020-04-01 DIAGNOSIS — R200 Anesthesia of skin: Secondary | ICD-10-CM | POA: Diagnosis not present

## 2020-04-01 LAB — COMPREHENSIVE METABOLIC PANEL
ALT: 11 U/L (ref 0–35)
AST: 12 U/L (ref 0–37)
Albumin: 4.1 g/dL (ref 3.5–5.2)
Alkaline Phosphatase: 58 U/L (ref 39–117)
BUN: 6 mg/dL (ref 6–23)
CO2: 36 mEq/L — ABNORMAL HIGH (ref 19–32)
Calcium: 9.3 mg/dL (ref 8.4–10.5)
Chloride: 98 mEq/L (ref 96–112)
Creatinine, Ser: 0.71 mg/dL (ref 0.40–1.20)
GFR: 85.75 mL/min (ref >60.00)
Glucose, Bld: 141 mg/dL — ABNORMAL HIGH (ref 70–99)
Potassium: 3.2 mEq/L — ABNORMAL LOW (ref 3.5–5.1)
Sodium: 141 mEq/L (ref 135–145)
Total Bilirubin: 0.7 mg/dL (ref 0.2–1.2)
Total Protein: 6.5 g/dL (ref 6.0–8.3)

## 2020-04-01 LAB — URINALYSIS
Bilirubin Urine: NEGATIVE
Hgb urine dipstick: NEGATIVE
Ketones, ur: NEGATIVE
Leukocytes,Ua: NEGATIVE
Nitrite: NEGATIVE
Specific Gravity, Urine: 1.015 (ref 1.000–1.030)
Total Protein, Urine: NEGATIVE
Urine Glucose: NEGATIVE
Urobilinogen, UA: >=8 — AB (ref 0.0–1.0)
pH: 8 (ref 5.0–8.0)

## 2020-04-01 LAB — HEMOGLOBIN A1C: Hgb A1c MFr Bld: 6.4 % (ref 4.6–6.5)

## 2020-04-01 MED ORDER — TRIAMCINOLONE ACETONIDE 0.1 % EX CREA: 1.0000 "application " | TOPICAL_CREAM | Freq: Two times a day (BID) | CUTANEOUS | 0 refills | Status: AC

## 2020-04-01 NOTE — Progress Notes (Signed)
Kara Mendoza is a 70 y.o. female with the following history as recorded in EpicCare:  Patient Active Problem List   Diagnosis Date Noted  . Dysuria 05/29/2018  . Residual cognitive deficit as late effect of stroke 04/26/2016  . Hemiparesis affecting left side as late effect of cerebrovascular accident (CVA) ([place]) 04/26/2016  . Carpal tunnel syndrome on left 11/29/2015  . DDD (degenerative disc disease), cervical 11/29/2015  . Depression 11/29/2015  . Chronic pain of left knee 09/04/2015  . Headache 09/04/2015  . Paroxysmal atrial fibrillation ([place]) 07/22/2015  . Complex regional pain syndrome type 2 of upper extremity 11/05/2013  . Carpal tunnel syndrome on right 10/22/2013  . Chronic embolism and thrombosis of deep vein of left upper extremity ([place]) 10/22/2013  . Cardiac device in situ 10/14/2013  . Essential hypertension 09/30/2013  . Complex regional pain syndrome type 1 of both upper extremities 09/30/2013  . Diabetes mellitus ([place]) 09/28/2013  . Hyperthyroidism 09/28/2013  . Median nerve neuritis 09/28/2013  . Ataxia, late effect of cerebrovascular disease 09/28/2013  . Spastic hemiplegia affecting dominant side ([place]) 09/28/2013  . CVA (cerebral vascular accident) ([place]) 08/15/2013  . Cerebral embolism with cerebral infarction ([place]) 08/09/2013  . Dysarthria 08/09/2013  . Abnormality of gait 05/01/2012  . Incontinence of urine 05/01/2012  . Tobacco abuse 01/28/2012  . Tobacco dependence syndrome 01/28/2012  . Plantar fasciitis 09/01/2010  . Allergic rhinitis 07/11/2010  . Persistent cough 06/16/2010  . Asthma night-time symptoms 06/14/2010  . GERD (gastroesophageal reflux disease) 05/26/2010  . Stroke ([place]) 05/26/2010  . Hyperlipidemia 05/26/2010  . Osteoarthritis 05/26/2010  . Mononeuritis 05/26/2010    Current Outpatient Medications  Medication Sig Dispense Refill  . acetaminophen (TYLENOL) 500 MG tablet Take 2 tablets (1,000 mg total) by mouth every 6 (six) hours  as needed. 30 tablet 0  . albuterol (PROVENTIL HFA;VENTOLIN HFA) 108 (90 Base) MCG/ACT inhaler Inhale 2 puffs into the lungs every 6 (six) hours as needed for wheezing or shortness of breath.    Marland Kitchen atorvastatin (LIPITOR) 40 MG tablet TAKE 1 TABLET BY MOUTH DAILY AT 6 PM 90 tablet 3  . clopidogrel (PLAVIX) 75 MG tablet TAKE 1 TABLET(75 MG) BY MOUTH DAILY 90 tablet 1  . diclofenac Sodium (VOLTAREN) 1 % GEL Apply 2 g topically 4 (four) times daily. 100 g 1  . DULoxetine (CYMBALTA) 60 MG capsule TAKE 1 CAPSULE(60 MG) BY MOUTH DAILY 90 capsule 1  . fluticasone (FLONASE) 50 MCG/ACT nasal spray Place 1 spray into both nostrils daily. (Patient taking differently: Place 1 spray into both nostrils daily as needed for allergies.) 16 g 6  . gabapentin (NEURONTIN) 300 MG capsule TAKE 1 CAPSULE(300 MG) BY MOUTH THREE TIMES DAILY 90 capsule 2  . Glucose Blood (BLOOD GLUCOSE TEST STRIPS) STRP Inject 1 Units into the skin 4 (four) times daily -  before meals and at bedtime. OneTouch Ultra mini strips (Patient taking differently: Inject 1 Units into the skin 4 (four) times daily -  before meals and at bedtime. OneTouch Ultra mini strips) 100 each 3  . losartan-hydrochlorothiazide (HYZAAR) 100-25 MG tablet TAKE 1 TABLET BY MOUTH DAILY 90 tablet 3  . metFORMIN (GLUCOPHAGE) 500 MG tablet TAKE 1 TABLET(500 MG) BY MOUTH DAILY WITH BREAKFAST 90 tablet 0  . Multiple Vitamins-Calcium (ONE-A-DAY WOMENS FORMULA PO) Take 1 tablet by mouth daily.    Marland Kitchen omeprazole (PRILOSEC) 20 MG capsule omeprazole 20 mg capsule,delayed release    . ONETOUCH DELICA LANCETS FINE MISC 1 Units by  Does not apply route 4 (four) times daily -  before meals and at bedtime. (Patient taking differently: 1 Units by Does not apply route 4 (four) times daily -  before meals and at bedtime.) 100 each 3  . oxyCODONE-acetaminophen (PERCOCET) 10-325 MG tablet Take 1 tablet by mouth 3 (three) times daily.    Marland Kitchen topiramate (TOPAMAX) 50 MG tablet TAKE ONE TABLET BY  MOUTH TWICE DAILY FOR MIGRAINES    . zolpidem (AMBIEN) 5 MG tablet TAKE 1 TABLET(5 MG) BY MOUTH AT BEDTIME AS NEEDED FOR SLEEP 30 tablet 0  . triamcinolone (KENALOG) 0.1 % Apply 1 application topically 2 (two) times daily. 45 g 0   No current facility-administered medications for this visit.    Allergies: Aspirin, Diclofenac, and Penicillins  Past Medical History:  Diagnosis Date  . Abnormality of gait   . Acute bronchitis   . Allergy   . Anxiety   . Anxiety state, unspecified   . Asthma   . Chronic arm pain 2008   left  . Chronic headache   . Chronic leg pain 2008   left  . Chronic pain syndrome   . Constipation   . Diabetes mellitus ([place])   . Dysarthria   . Esophageal reflux   . Fatigue   . History of fall   . Hyperlipidemia   . Hypertension   . Hyperthyroidism   . LOOP Recorder 10/14/2013  . Major depressive disorder, recurrent episode, moderate ([place])   . Memory loss   . Neuromuscular disorder ([place])   . Osteoarthritis   . Stroke ([place])   . Tobacco use disorder     Past Surgical History:  Procedure Laterality Date  . CESAREAN SECTION     x3  . FINGER SURGERY    . LOOP RECORDER IMPLANT  08-12-2013   MDT LINQ implanted by Dr Caryl Comes for cryptogenic stroke  . LOOP RECORDER IMPLANT N/A 08/12/2013   Procedure: LOOP RECORDER IMPLANT;  Surgeon: Deboraha Sprang, MD;  Location: [facility] CATH LAB;  Service: Cardiovascular;  Laterality: N/A;  . TEE WITHOUT CARDIOVERSION N/A 08/12/2013   Procedure: TRANSESOPHAGEAL ECHOCARDIOGRAM (TEE);  Surgeon: Josue Hector, MD;  Location: [facility] ENDOSCOPY;  Service: Cardiovascular;  Laterality: N/A;    Family History  Problem Relation Age of Onset  . Cancer Father   . Diabetes Sister   . Diabetes Brother     Social History   Tobacco Use  . Smoking status: Light Tobacco Smoker    Packs/day: 0.20    Years: 15.00    Pack years: 3.00    Types: Cigarettes  . Smokeless tobacco: Never Used  . Tobacco comment:  smoking 3-4 a day  Substance Use Topics   . Alcohol use: No    Alcohol/week: 0.0 standard drinks    Subjective:  Presents again with concerns for bilateral hand pain; this is not a new issue for this patient- similar complaints since 2019 but now patient feels like the pain is spreading into her hands; she has seen numerous orthopedists including head surgeon at [facility] in 2019- patient did not complete the recommended work up there as recommended; has been diagnosed with complex regional pain syndrome and is under the care of pain management; patient is very concerned as to why the pain would be spreading and getting worse;    Objective:  Vitals:   04/01/20 1404  BP: (!) 128/92  Pulse: 98  Temp: 98.4 F (36.9 C)  TempSrc: Oral  SpO2: 95%  General: Well developed, well nourished, in no acute distress  Skin : Warm and dry.  Head: Normocephalic and atraumatic  Lungs: Respirations unlabored; clear to auscultation bilaterally without wheeze, rales, rhonchi  Musculoskeletal: No deformities; no active joint inflammation  Extremities: No edema, cyanosis, clubbing  Vessels: Symmetric bilaterally  Neurologic: Alert and oriented; speech intact; face symmetrical; moves all extremities well; CNII-XII intact without focal deficit   Assessment:  1. Bilateral hand pain   2. Type 2 diabetes mellitus with diabetic polyneuropathy, without long-term current use of insulin ([place])   3. Urinary frequency   4. Numbness and tingling     Plan:  1. Again stressed to patient the need to follow up and complete recommended orthopedic evaluation; concern would be for worsening carpal tunnel syndrome and she may need surgery. Referral is done again; She is also encouraged to discuss her worsening pain with her pain management specialist.  2. Refer to ophthalmology again- this was done in 11/2019 and she did not follow-up; check CMP, Hgba1c; 3. Check urine, urine culture today; 4. Patient was referred to neurology last fall and she  appears to have refused to schedule;  This visit occurred during the SARS-CoV-2 public health emergency.  Safety protocols were in place, including screening questions prior to the visit, additional usage of staff PPE, and extensive cleaning of exam room while observing appropriate contact time as indicated for disinfecting solutions.     No follow-ups on file.  Orders Placed This Encounter  Procedures  . Urine Culture    Standing Status:   Future    Number of Occurrences:   1    Standing Expiration Date:   04/01/2021  . Comp Met (CMET)    Standing Status:   Future    Number of Occurrences:   1    Standing Expiration Date:   04/01/2021  . Hemoglobin A1c    Standing Status:   Future    Number of Occurrences:   1    Standing Expiration Date:   04/01/2021  . Urinalysis    Standing Status:   Future    Number of Occurrences:   1    Standing Expiration Date:   04/01/2021  . Ambulatory referral to Orthopedic Surgery    Referral Priority:   Routine    Referral Type:   Surgical    Referral Reason:   Specialty Services Required    Requested Specialty:   Orthopedic Surgery    Number of Visits Requested:   1  . Ambulatory referral to Ophthalmology    Referral Priority:   Routine    Referral Type:   Consultation    Referral Reason:   Specialty Services Required    Requested Specialty:   Ophthalmology    Number of Visits Requested:   1    Requested Prescriptions   Signed Prescriptions Disp Refills  . triamcinolone (KENALOG) 0.1 % 45 g 0    Sig: Apply 1 application topically 2 (two) times daily.

## 2020-04-02 LAB — URINE CULTURE

## 2020-04-04 ENCOUNTER — Other Ambulatory Visit: Payer: Self-pay | Admitting: Family

## 2020-04-04 ENCOUNTER — Telehealth: Payer: Self-pay

## 2020-04-04 MED ORDER — POTASSIUM CHLORIDE ER 10 MEQ PO TBCR: 10.0000 meq | EXTENDED_RELEASE_TABLET | Freq: Every day | ORAL | 0 refills | Status: DC

## 2020-04-04 NOTE — Telephone Encounter (Signed)
-----   Message from Olive Bass, FNP sent at 04/04/2020 12:25 PM EST ----- No UTI noted; Her diabetes is well controlled; her potassium was down slightly- I will supplement this with daily supplement x 1 week;  She needs to see orthopedist as repeatedly discussed to get evaluation and treatment for her hand issues. They will call her to schedule.

## 2020-04-05 NOTE — Telephone Encounter (Signed)
   Patients son calling states they do not want a ortho referral , requesting referral to neuro surgoen

## 2020-04-05 NOTE — Telephone Encounter (Signed)
Patients son called and is requesting a referral be placed to [place]. Please advise   Phone: [phone].

## 2020-04-05 NOTE — Telephone Encounter (Signed)
If she has carpal tunnel, this is typically managed by an orthopedist; I would like her to start with the orthopedist and they can refer her to a neurosurgeon if they feel that is warranted.

## 2020-04-05 NOTE — Telephone Encounter (Signed)
Pt & son notified of Vernona Rieger response.  Son states that they spoke with someone today at [facility] who told them they should be seen by a neurologist.  According to [facility]: "Thanks for trusting the care of your patient to our office. We would love to schedule an appointment, however I spoke with patient and she feels like she needs a Neuro or Hand specialist. Pt states its more with the nerves in her hand. Our office do not have a Hand specialist, we have a few of our providers that do some hand work, if not we send to [facility] [place]. Pt request to be sent to [facility] or to a Neuro provider." Son advised to at least see an orthopedist as directed by PCP, however, he states "we don't have to do it, we're in control"  Pt states she will do whatever Vernona Rieger wants her to do.   Referral team states that they have sent the referral to [facility] [place], however, Cyndia Skeeters states they will see pt if that's what PCP desires.

## 2020-04-06 NOTE — Telephone Encounter (Signed)
Pt's son states since he got what he was asking for he has no further ques/concerns at this time.

## 2020-04-06 NOTE — Telephone Encounter (Signed)
I am not sure how to help this patient-  We have sent numerous referrals to neurology- she has not followed up.  She has been referred to a hand specialist. If the hand specialist feels a neurosurgeon is needed, they will get her set up.

## 2020-04-06 NOTE — Telephone Encounter (Signed)
Patients son is requesting a call back at [phone]. Please advise.

## 2020-04-13 DIAGNOSIS — G90519 Complex regional pain syndrome I of unspecified upper limb: Secondary | ICD-10-CM | POA: Diagnosis not present

## 2020-04-13 DIAGNOSIS — M1812 Unilateral primary osteoarthritis of first carpometacarpal joint, left hand: Secondary | ICD-10-CM | POA: Diagnosis not present

## 2020-04-13 DIAGNOSIS — G894 Chronic pain syndrome: Secondary | ICD-10-CM | POA: Diagnosis not present

## 2020-04-13 DIAGNOSIS — G5602 Carpal tunnel syndrome, left upper limb: Secondary | ICD-10-CM | POA: Diagnosis not present

## 2020-04-14 ENCOUNTER — Ambulatory Visit (INDEPENDENT_AMBULATORY_CARE_PROVIDER_SITE_OTHER): Payer: Medicare Other

## 2020-04-14 ENCOUNTER — Ambulatory Visit (INDEPENDENT_AMBULATORY_CARE_PROVIDER_SITE_OTHER): Payer: Medicare Other | Admitting: Nurse Practitioner

## 2020-04-14 ENCOUNTER — Other Ambulatory Visit: Payer: Self-pay

## 2020-04-14 ENCOUNTER — Encounter: Payer: Self-pay | Admitting: Nurse Practitioner

## 2020-04-14 VITALS — BP 140/70 | HR 100 | Temp 97.3°F | Wt 152.6 lb

## 2020-04-14 VITALS — BP 140/80 | HR 94 | Temp 97.9°F | Ht 62.0 in | Wt 151.8 lb

## 2020-04-14 DIAGNOSIS — R1031 Right lower quadrant pain: Secondary | ICD-10-CM | POA: Diagnosis not present

## 2020-04-14 DIAGNOSIS — R350 Frequency of micturition: Secondary | ICD-10-CM

## 2020-04-14 DIAGNOSIS — Z0000 Encounter for general adult medical examination without abnormal findings: Secondary | ICD-10-CM | POA: Diagnosis not present

## 2020-04-14 LAB — POCT URINALYSIS DIPSTICK
Bilirubin, UA: NEGATIVE
Blood, UA: NEGATIVE
Glucose, UA: NEGATIVE
Ketones, UA: NEGATIVE
Leukocytes, UA: NEGATIVE
Nitrite, UA: NEGATIVE
Protein, UA: NEGATIVE
Spec Grav, UA: 1.015 (ref 1.010–1.025)
Urobilinogen, UA: 1 E.U./dL
pH, UA: 8 (ref 5.0–8.0)

## 2020-04-14 NOTE — Addendum Note (Signed)
Addended by: Mickeal Needy on: 04/14/2020 03:19 PM   Modules accepted: Kipp Brood

## 2020-04-14 NOTE — Progress Notes (Addendum)
Subjective:   Kara Mendoza is a 70 y.o. female who presents for Medicare Annual (Subsequent) preventive examination.  Review of Systems No ROS. Medicare Wellness Visit. Additional risk factors are reflected in social history. Cardiac Risk Factors include: advanced age (>43men, >68 women);diabetes mellitus;dyslipidemia;hypertension     Objective:    Today's Vitals   04/14/20 1215  BP: 140/80  Pulse: 94  Temp: 97.9 F (36.6 C)  SpO2: 95%  Weight: 151 lb 12.8 oz (68.9 kg)  Height:  (1.575 m)   Body mass index is 27.76 kg/m.  Advanced Directives 04/14/2020 01/29/2018 02/22/2017 11/02/2016 06/22/2016 02/21/2016 02/07/2016  Does Patient Have a Medical Advance Directive? No No No No No No No  Does patient want to make changes to medical advance directive? - - - - Yes (ED - Information included in AVS) - -  Would patient like information on creating a medical advance directive? Yes (MAU/Ambulatory/Procedural Areas - Information given) No - Patient declined No - Patient declined - - No - Patient declined (No Data)  Pre-existing out of facility DNR order (yellow form or pink MOST form) - - - - - - -    Current Medications (verified) Outpatient Encounter Medications as of 04/14/2020  Medication Sig   acetaminophen (TYLENOL) 500 MG tablet Take 2 tablets (1,000 mg total) by mouth every 6 (six) hours as needed.   albuterol (PROVENTIL HFA;VENTOLIN HFA) 108 (90 Base) MCG/ACT inhaler Inhale 2 puffs into the lungs every 6 (six) hours as needed for wheezing or shortness of breath.   atorvastatin (LIPITOR) 40 MG tablet TAKE 1 TABLET BY MOUTH DAILY AT 6 PM   clopidogrel (PLAVIX) 75 MG tablet TAKE 1 TABLET(75 MG) BY MOUTH DAILY   diclofenac Sodium (VOLTAREN) 1 % GEL Apply 2 g topically 4 (four) times daily.   DULoxetine (CYMBALTA) 60 MG capsule TAKE 1 CAPSULE(60 MG) BY MOUTH DAILY   fluticasone (FLONASE) 50 MCG/ACT nasal spray Place 1 spray into both nostrils daily. (Patient taking  differently: Place 1 spray into both nostrils daily as needed for allergies.)   gabapentin (NEURONTIN) 300 MG capsule TAKE 1 CAPSULE(300 MG) BY MOUTH THREE TIMES DAILY   Glucose Blood (BLOOD GLUCOSE TEST STRIPS) STRP Inject 1 Units into the skin 4 (four) times daily -  before meals and at bedtime. OneTouch Ultra mini strips (Patient taking differently: Inject 1 Units into the skin 4 (four) times daily -  before meals and at bedtime. OneTouch Ultra mini strips)   losartan-hydrochlorothiazide (HYZAAR) 100-25 MG tablet TAKE 1 TABLET BY MOUTH DAILY   metFORMIN (GLUCOPHAGE) 500 MG tablet TAKE 1 TABLET(500 MG) BY MOUTH DAILY WITH BREAKFAST   Multiple Vitamins-Calcium (ONE-A-DAY WOMENS FORMULA PO) Take 1 tablet by mouth daily.   omeprazole (PRILOSEC) 20 MG capsule omeprazole 20 mg capsule,delayed release   ONETOUCH DELICA LANCETS FINE MISC 1 Units by Does not apply route 4 (four) times daily -  before meals and at bedtime. (Patient taking differently: 1 Units by Does not apply route 4 (four) times daily -  before meals and at bedtime.)   oxyCODONE-acetaminophen (PERCOCET) 10-325 MG tablet Take 1 tablet by mouth 3 (three) times daily.   potassium chloride (KLOR-CON) 10 MEQ tablet Take 1 tablet (10 mEq total) by mouth daily.   topiramate (TOPAMAX) 50 MG tablet TAKE ONE TABLET BY MOUTH TWICE DAILY FOR MIGRAINES   triamcinolone (KENALOG) 0.1 % Apply 1 application topically 2 (two) times daily.   zolpidem (AMBIEN) 5 MG tablet TAKE 1 TABLET(5  MG) BY MOUTH AT BEDTIME AS NEEDED FOR SLEEP   No facility-administered encounter medications on file as of 04/14/2020.    Allergies (verified) Aspirin, Diclofenac, and Penicillins   History: Past Medical History:  Diagnosis Date   Abnormality of gait    Acute bronchitis    Allergy    Anxiety    Anxiety state, unspecified    Asthma    Chronic arm pain 2008   left   Chronic headache    Chronic leg pain 2008   left   Chronic pain syndrome    Constipation     Diabetes mellitus (HCC)    Dysarthria    Esophageal reflux    Fatigue    History of fall    Hyperlipidemia    Hypertension    Hyperthyroidism    LOOP Recorder 10/14/2013   Major depressive disorder, recurrent episode, moderate (HCC)    Memory loss    Neuromuscular disorder (HCC)    Osteoarthritis    Stroke (HCC)    Tobacco use disorder    Past Surgical History:  Procedure Laterality Date   CESAREAN SECTION     x3   FINGER SURGERY     LOOP RECORDER IMPLANT  08-12-2013   MDT LINQ implanted by Dr Graciela Husbands for cryptogenic stroke   LOOP RECORDER IMPLANT N/A 08/12/2013   Procedure: LOOP RECORDER IMPLANT;  Surgeon: Duke Salvia, MD;  [facility] CATH LAB;  Service: Cardiovascular;  Laterality: N/A;   TEE WITHOUT CARDIOVERSION N/A 08/12/2013   Procedure: TRANSESOPHAGEAL ECHOCARDIOGRAM (TEE);  Surgeon: Wendall Stade, MD;  Location[facility] ENDOSCOPY;  Service: Cardiovascular;  Laterality: N/A;   Family History  Problem Relation Age of Onset   Cancer Father    Diabetes Sister    Diabetes Brother    Social History   Socioeconomic History   Marital status: Single    Spouse name: Not on file   Number of children: 3   Years of education: College   Highest education level: Some college, no degree  Occupational History    Employer: OTHER    Comment: Disabled  Tobacco Use   Smoking status: Light Tobacco Smoker    Packs/day: 0.20    Years: 15.00    Pack years: 3.00    Types: Cigarettes   Smokeless tobacco: Never Used   Tobacco comment:  smoking 3-4 a day  Substance and Sexual Activity   Alcohol use: No    Alcohol/week: 0.0 standard drinks   Drug use: No   Sexual activity: Not on file  Other Topics Concern   Not on file  Social History Narrative   Patient is single with 3 children.   Patient is right handed.   Patient has college education.   Patient drinks 2 cups daily.      Patient lives in 2nd floor apartment. Has 17 steps to climb.    Social Determinants of Health    Financial Resource Strain: Low Risk    Difficulty of Paying Living Expenses: Not hard at all  Food Insecurity: No Food Insecurity   Worried About Programme researcher, broadcasting/film/video in the Last Year: Never true   Ran Out of Food in the Last Year: Never true  Transportation Needs: No Transportation Needs   Lack of Transportation (Medical): No   Lack of Transportation (Non-Medical): No  Physical Activity: Inactive   Days of Exercise per Week: 0 days   Minutes of Exercise per Session: 0 min  Stress: No Stress Concern Present  Feeling of Stress : Not at all  Social Connections: Moderately Integrated   Frequency of Communication with Friends and Family: More than three times a week   Frequency of Social Gatherings with Friends and Family: More than three times a week   Attends Religious Services: 1 to 4 times per year   Active Member of [employer]ubs or Organizations: No   Attends Engineer, structuralClub or Organization Meetings: 1 to 4 times per year   Marital Status: Never married    Tobacco Counseling Ready to quit: Not Answered Counseling given: Not Answered Comment:  smoking 3-4 a day   Clinical Intake:  Pre-visit preparation completed: Yes  Pain : Faces Faces Pain Scale: Hurts whole lot Pain Type: Acute pain Pain Location: Back Pain Orientation: Right Pain Radiating Towards: right kidney area and pelvic area Pain Descriptors / Indicators: Constant,Discomfort,Restless,Pressure Pain Onset: 1 to 4 weeks ago Pain Frequency: Constant Pain Relieving Factors: none Effect of Pain on Daily Activities: Pain produces disability and affects the quality of life.  Faces Pain Scale: Hurts whole lot Pain Relieving Factors: none  BMI - recorded: 27.76 Nutritional Status: BMI 25 -29 Overweight Nutritional Risks: None Diabetes: No  How often do you need to have someone help you when you read instructions, pamphlets, or other written materials from your doctor or pharmacy?: 1 - Never What is the last grade level you  completed in school?: College Education  Diabetic? yes  Interpreter Needed?: No  Information entered by :: Susie CassetteShenika Kharon Hixon   Activities of Daily Living In your present state of health, do you have any difficulty performing the following activities: 04/14/2020 11/25/2019  Hearing? N N  Vision? N N  Difficulty concentrating or making decisions? N N  Walking or climbing stairs? N Y  Dressing or bathing? N N  Doing errands, shopping? N N  Preparing Food and eating ? N -  Using the Toilet? N -  In the past six months, have you accidently leaked urine? Y -  Do you have problems with loss of bowel control? N -  Managing your Medications? N -  Managing your Finances? N -  Housekeeping or managing your Housekeeping? N -  Some recent data might be hidden    Patient Care Team: Olive BassMurray, Laura Woodruff, FNP as PCP - General (Internal Medicine)  Indicate any recent Medical Services you may have received from other than [facility] providers in the past year (date may be approximate).     Assessment:   This is a routine wellness examination for Kara HatchetSheila.  Hearing/Vision screen No exam data present  Dietary issues and exercise activities discussed: Current Exercise Habits: The patient does not participate in regular exercise at present, Exercise limited by: neurologic condition(s);cardiac condition(s);respiratory conditions(s)  Goals      Blood Pressure < 140/90     I want to sing     I will continue to travel with my brother and my church choir, I will play my CDs at home and sing.     LDL CALC < 100     Prevent Falls     Quit smoking / using tobacco       Depression Screen PHQ 2/9 Scores 04/14/2020 11/23/2016 11/23/2016 06/22/2016 02/21/2016 02/07/2016 02/07/2016  PHQ - 2 Score 0 3 3 2 2 1 4   PHQ- 9 Score - 7 - 5 8 - 13    Fall Risk Fall Risk  04/14/2020 06/20/2017 12/17/2016 11/23/2016 09/11/2016  Falls in the past year? 0 No No Yes No  Number falls in past yr: 0 - - 2 or more -  Comment  - - - - -  Injury with Fall? 0 - - - -  Risk Factor Category  - - - - -  Risk for fall due to : No Fall Risks - - - -  Risk for fall due to: Comment - - - - -  Follow up Falls evaluation completed - - - -  Comment - - - - -    FALL RISK PREVENTION PERTAINING TO THE HOME:  Any stairs in or around the home? Yes  If so, are there any without handrails? No  Home free of loose throw rugs in walkways, pet beds, electrical cords, etc? Yes  Adequate lighting in your home to reduce risk of falls? Yes   ASSISTIVE DEVICES UTILIZED TO PREVENT FALLS:  Life alert? Yes  Use of a cane, walker or w/c? No  Grab bars in the bathroom? Yes  Shower chair or bench in shower? Yes  Elevated toilet seat or a handicapped toilet? Yes   TIMED UP AND GO:  Was the test performed? No .  Length of time to ambulate 10 feet: 0 sec.   Gait steady and fast without use of assistive device  Cognitive Function: MMSE - Mini Mental State Exam 06/22/2016  Orientation to time 5  Orientation to Place 5  Registration 3  Attention/ Calculation 5  Recall 3  Language- name 2 objects 2  Language- repeat 1  Language- follow 3 step command 3  Language- read & follow direction 1  Write a sentence 1  Copy design 1  Total score 30        Immunizations  There is no immunization history on file for this patient.  TDAP status: Due, Education has been provided regarding the importance of this vaccine. Advised may receive this vaccine at local pharmacy or Health Dept. Aware to provide a copy of the vaccination record if obtained from local pharmacy or Health Dept. Verbalized acceptance and understanding.  Flu Vaccine status: Declined, Education has been provided regarding the importance of this vaccine but patient still declined. Advised may receive this vaccine at local pharmacy or Health Dept. Aware to provide a copy of the vaccination record if obtained from local pharmacy or Health Dept. Verbalized acceptance and  understanding.  Pneumococcal vaccine status: Declined,  Education has been provided regarding the importance of this vaccine but patient still declined. Advised may receive this vaccine at local pharmacy or Health Dept. Aware to provide a copy of the vaccination record if obtained from local pharmacy or Health Dept. Verbalized acceptance and understanding.   Covid-19 vaccine status: Declined, Education has been provided regarding the importance of this vaccine but patient still declined. Advised may receive this vaccine at local pharmacy or Health Dept.or vaccine clinic. Aware to provide a copy of the vaccination record if obtained from local pharmacy or Health Dept. Verbalized acceptance and understanding.  Qualifies for Shingles Vaccine? Yes   Zostavax completed No   Shingrix Completed?: No.    Education has been provided regarding the importance of this vaccine. Patient has been advised to call insurance company to determine out of pocket expense if they have not yet received this vaccine. Advised may also receive vaccine at local pharmacy or Health Dept. Verbalized acceptance and understanding.  Screening Tests Health Maintenance  Topic Date Due   Hepatitis C Screening  Never done   COVID-19 Vaccine (1) Never done   MAMMOGRAM  03/22/2017   FOOT EXAM  04/04/2017   OPHTHALMOLOGY EXAM  11/14/2018   INFLUENZA VACCINE  05/05/2020 (Originally 09/06/2019)   TETANUS/TDAP  11/24/2020 (Originally 06/25/2018)   PNA vac Low Risk Adult (1 of 2 - PCV13) 11/24/2020 (Originally 03/05/2014)   HEMOGLOBIN A1C  09/29/2020   Fecal DNA (Cologuard)  12/01/2022   DEXA SCAN  Completed   HPV VACCINES  Aged Out    Health Maintenance  Health Maintenance Due  Topic Date Due   Hepatitis C Screening  Never done   COVID-19 Vaccine (1) Never done   MAMMOGRAM  03/22/2017   FOOT EXAM  04/04/2017   OPHTHALMOLOGY EXAM  11/14/2018    Colorectal cancer screening: Type of screening: Cologuard. Completed 12/01/2019.  Repeat every 3 years  Mammogram status: Completed 03/07/2015. Repeat every year  Bone Density status: Completed 05/21/2016. Results reflect: Bone density results: NORMAL. Repeat every 5 years.  Lung Cancer Screening: (Low Dose CT Chest recommended if Age 84-80 years, 30 pack-year currently smoking OR have quit w/in 15years.) does qualify.   Lung Cancer Screening Referral: no  Additional Screening:  Hepatitis C Screening: does not qualify; Completed no  Vision Screening: Recommended annual ophthalmology exams for early detection of glaucoma and other disorders of the eye. Is the patient up to date with their annual eye exam?  No  Who is the provider or what is the name of the office in which the patient attends annual eye exams? Need Referral to diabetic eye specialist If pt is not established with a provider, would they like to be referred to a provider to establish care? Yes .   Dental Screening: Recommended annual dental exams for proper oral hygiene  Community Resource Referral / Chronic Care Management: CRR required this visit?  Yes   CCM required this visit?  No      Plan:     I have personally reviewed and noted the following in the patient's chart:   Medical and social history Use of alcohol, tobacco or illicit drugs  Current medications and supplements Functional ability and status Nutritional status Physical activity Advanced directives List of other physicians Hospitalizations, surgeries, and ER visits in previous 12 months Vitals Screenings to include cognitive, depression, and falls Referrals and appointments  In addition, I have reviewed and discussed with patient certain preventive protocols, quality metrics, and best practice recommendations. A written personalized care plan for preventive services as well as general preventive health recommendations were provided to patient.     Mickeal Needy, LPN   [id number]   Nurse Notes:  Normal cognitive  status assessed by direct observation by this Nurse Health Advisor. No abnormalities found.  Medications reviewed with patient; opioid use noted.   Medical screening examination/treatment/procedure(s) were performed by non-physician practitioner and as supervising physician I was immediately available for consultation/collaboration.  I agree with above. Jacinta Shoe, MD

## 2020-04-14 NOTE — Patient Instructions (Signed)
Kara Mendoza , Thank you for taking time to come for your Medicare Wellness Visit. I appreciate your ongoing commitment to your health goals. Please review the following plan we discussed and let me know if I can assist you in the future.   Screening recommendations/referrals: Colonoscopy: 12/01/2019; due every 3 years (Cologuard) Mammogram: 03/07/2015 Bone Density: 05/21/2016; normal Recommended yearly ophthalmology/optometry visit for glaucoma screening and checkup Recommended yearly dental visit for hygiene and checkup  Vaccinations: Influenza vaccine: declined Pneumococcal vaccine: declined Tdap vaccine: declined Shingles vaccine: declined   Covid-19: declined  Advanced directives: Documents on file  Conditions/risks identified: Yes; Reviewed health maintenance screenings with patient today and relevant education, vaccines, and/or referrals were provided. Please continue to do your personal lifestyle choices by: daily care of teeth and gums, regular physical activity (goal should be 5 days a week for 30 minutes), eat a healthy diet, avoid tobacco and drug use, limiting any alcohol intake, taking a low-dose aspirin (if not allergic or have been advised by your provider otherwise) and taking vitamins and minerals as recommended by your provider. Continue doing brain stimulating activities (puzzles, reading, adult coloring books, staying active) to keep memory sharp. Continue to eat heart healthy diet (full of fruits, vegetables, whole grains, lean protein, water--limit salt, fat, and sugar intake) and increase physical activity as tolerated.  Next appointment: Please schedule your next Medicare Wellness Visit with your Nurse Health Advisor in 1 year by calling [phone].   Preventive Care 71 Years and Older, Female Preventive care refers to lifestyle choices and visits with your health care provider that can promote health and wellness. What does preventive care include?  A yearly  physical exam. This is also called an annual well check.  Dental exams once or twice a year.  Routine eye exams. Ask your health care provider how often you should have your eyes checked.  Personal lifestyle choices, including:  Daily care of your teeth and gums.  Regular physical activity.  Eating a healthy diet.  Avoiding tobacco and drug use.  Limiting alcohol use.  Practicing safe sex.  Taking low-dose aspirin every day.  Taking vitamin and mineral supplements as recommended by your health care provider. What happens during an annual well check? The services and screenings done by your health care provider during your annual well check will depend on your age, overall health, lifestyle risk factors, and family history of disease. Counseling  Your health care provider may ask you questions about your:  Alcohol use.  Tobacco use.  Drug use.  Emotional well-being.  Home and relationship well-being.  Sexual activity.  Eating habits.  History of falls.  Memory and ability to understand (cognition).  Work and work Astronomer.  Reproductive health. Screening  You may have the following tests or measurements:  Height, weight, and BMI.  Blood pressure.  Lipid and cholesterol levels. These may be checked every 5 years, or more frequently if you are over 71 years old.  Skin check.  Lung cancer screening. You may have this screening every year starting at age 41 if you have a 30-pack-year history of smoking and currently smoke or have quit within the past 15 years.  Fecal occult blood test (FOBT) of the stool. You may have this test every year starting at age 62.  Flexible sigmoidoscopy or colonoscopy. You may have a sigmoidoscopy every 5 years or a colonoscopy every 10 years starting at age 37.  Hepatitis C blood test.  Hepatitis B blood test.  Sexually transmitted  disease (STD) testing.  Diabetes screening. This is done by checking your blood sugar  (glucose) after you have not eaten for a while (fasting). You may have this done every 1-3 years.  Bone density scan. This is done to screen for osteoporosis. You may have this done starting at age 59.  Mammogram. This may be done every 1-2 years. Talk to your health care provider about how often you should have regular mammograms. Talk with your health care provider about your test results, treatment options, and if necessary, the need for more tests. Vaccines  Your health care provider may recommend certain vaccines, such as:  Influenza vaccine. This is recommended every year.  Tetanus, diphtheria, and acellular pertussis (Tdap, Td) vaccine. You may need a Td booster every 10 years.  Zoster vaccine. You may need this after age 22.  Pneumococcal 13-valent conjugate (PCV13) vaccine. One dose is recommended after age 28.  Pneumococcal polysaccharide (PPSV23) vaccine. One dose is recommended after age 84. Talk to your health care provider about which screenings and vaccines you need and how often you need them. This information is not intended to replace advice given to you by your health care provider. Make sure you discuss any questions you have with your health care provider. Document Released: 02/18/2015 Document Revised: 10/12/2015 Document Reviewed: 11/23/2014 Elsevier Interactive Patient Education  2017 [employer].  Fall Prevention in the Home Falls can cause injuries. They can happen to people of all ages. There are many things you can do to make your home safe and to help prevent falls. What can I do on the outside of my home?  Regularly fix the edges of walkways and driveways and fix any cracks.  Remove anything that might make you trip as you walk through a door, such as a raised step or threshold.  Trim any bushes or trees on the path to your home.  Use bright outdoor lighting.  Clear any walking paths of anything that might make someone trip, such as rocks or  tools.  Regularly check to see if handrails are loose or broken. Make sure that both sides of any steps have handrails.  Any raised decks and porches should have guardrails on the edges.  Have any leaves, snow, or ice cleared regularly.  Use sand or salt on walking paths during winter.  Clean up any spills in your garage right away. This includes oil or grease spills. What can I do in the bathroom?  Use night lights.  Install grab bars by the toilet and in the tub and shower. Do not use towel bars as grab bars.  Use non-skid mats or decals in the tub or shower.  If you need to sit down in the shower, use a plastic, non-slip stool.  Keep the floor dry. Clean up any water that spills on the floor as soon as it happens.  Remove soap buildup in the tub or shower regularly.  Attach bath mats securely with double-sided non-slip rug tape.  Do not have throw rugs and other things on the floor that can make you trip. What can I do in the bedroom?  Use night lights.  Make sure that you have a light by your bed that is easy to reach.  Do not use any sheets or blankets that are too big for your bed. They should not hang down onto the floor.  Have a firm chair that has side arms. You can use this for support while you get dressed.  Do not have throw rugs and other things on the floor that can make you trip. What can I do in the kitchen?  Clean up any spills right away.  Avoid walking on wet floors.  Keep items that you use a lot in easy-to-reach places.  If you need to reach something above you, use a strong step stool that has a grab bar.  Keep electrical cords out of the way.  Do not use floor polish or wax that makes floors slippery. If you must use wax, use non-skid floor wax.  Do not have throw rugs and other things on the floor that can make you trip. What can I do with my stairs?  Do not leave any items on the stairs.  Make sure that there are handrails on both  sides of the stairs and use them. Fix handrails that are broken or loose. Make sure that handrails are as long as the stairways.  Check any carpeting to make sure that it is firmly attached to the stairs. Fix any carpet that is loose or worn.  Avoid having throw rugs at the top or bottom of the stairs. If you do have throw rugs, attach them to the floor with carpet tape.  Make sure that you have a light switch at the top of the stairs and the bottom of the stairs. If you do not have them, ask someone to add them for you. What else can I do to help prevent falls?  Wear shoes that:  Do not have high heels.  Have rubber bottoms.  Are comfortable and fit you well.  Are closed at the toe. Do not wear sandals.  If you use a stepladder:  Make sure that it is fully opened. Do not climb a closed stepladder.  Make sure that both sides of the stepladder are locked into place.  Ask someone to hold it for you, if possible.  Clearly mark and make sure that you can see:  Any grab bars or handrails.  First and last steps.  Where the edge of each step is.  Use tools that help you move around (mobility aids) if they are needed. These include:  Canes.  Walkers.  Scooters.  Crutches.  Turn on the lights when you go into a dark area. Replace any light bulbs as soon as they burn out.  Set up your furniture so you have a clear path. Avoid moving your furniture around.  If any of your floors are uneven, fix them.  If there are any pets around you, be aware of where they are.  Review your medicines with your doctor. Some medicines can make you feel dizzy. This can increase your chance of falling. Ask your doctor what other things that you can do to help prevent falls. This information is not intended to replace advice given to you by your health care provider. Make sure you discuss any questions you have with your health care provider. Document Released: 11/18/2008 Document Revised:  06/30/2015 Document Reviewed: 02/26/2014 Elsevier Interactive Patient Education  2017 [employer].

## 2020-04-14 NOTE — Patient Instructions (Addendum)
Normal urinalysis POCT. Urine sent for culture. Increase oral hydration with water mostly (60oz per day).

## 2020-04-14 NOTE — Progress Notes (Signed)
Subjective:  Patient ID: Kara Mendoza, female    DOB: 04-Oct-1950  Age: 70 y.o. MRN: [id number]  CC: Acute Visit (Pt c/o right side groin pain, odor, and urinary frequency x 1 month. Pt states when she had a UTI in the past she experienced the same symptoms and would like antibiotics.)  Urinary Frequency  This is a new problem. The current episode started more than 1 month ago. The problem occurs every urination. The problem has been unchanged. The pain is at a severity of 0/10. The patient is experiencing no pain. There has been no fever. The fever has been present for less than 1 day. She is not sexually active. There is no history of pyelonephritis. Associated symptoms include frequency. Pertinent negatives include no chills, discharge, flank pain, hematuria, hesitancy, nausea, possible pregnancy, sweats, urgency or vomiting. She has tried nothing for the symptoms. There is no history of kidney stones, recurrent UTIs or urinary stasis.  drinks 16oz of water per day. Describes urine as dark with strong odor. Also has right hip pain in supine position or laying on right side. No rash or nodule in groin region. Urinalysis and urine culture completed 45month ago (normal)  Reviewed past Medical, Social and Family history today.  Outpatient Medications Prior to Visit  Medication Sig Dispense Refill  . acetaminophen (TYLENOL) 500 MG tablet Take 2 tablets (1,000 mg total) by mouth every 6 (six) hours as needed. 30 tablet 0  . albuterol (PROVENTIL HFA;VENTOLIN HFA) 108 (90 Base) MCG/ACT inhaler Inhale 2 puffs into the lungs every 6 (six) hours as needed for wheezing or shortness of breath.    Marland Kitchen atorvastatin (LIPITOR) 40 MG tablet TAKE 1 TABLET BY MOUTH DAILY AT 6 PM 90 tablet 3  . clopidogrel (PLAVIX) 75 MG tablet TAKE 1 TABLET(75 MG) BY MOUTH DAILY 90 tablet 1  . diclofenac Sodium (VOLTAREN) 1 % GEL Apply 2 g topically 4 (four) times daily. 100 g 1  . DULoxetine (CYMBALTA) 60 MG capsule TAKE 1  CAPSULE(60 MG) BY MOUTH DAILY 90 capsule 1  . fluticasone (FLONASE) 50 MCG/ACT nasal spray Place 1 spray into both nostrils daily. (Patient taking differently: Place 1 spray into both nostrils daily as needed for allergies.) 16 g 6  . gabapentin (NEURONTIN) 300 MG capsule TAKE 1 CAPSULE(300 MG) BY MOUTH THREE TIMES DAILY 90 capsule 2  . Glucose Blood (BLOOD GLUCOSE TEST STRIPS) STRP Inject 1 Units into the skin 4 (four) times daily -  before meals and at bedtime. OneTouch Ultra mini strips (Patient taking differently: Inject 1 Units into the skin 4 (four) times daily -  before meals and at bedtime. OneTouch Ultra mini strips) 100 each 3  . losartan-hydrochlorothiazide (HYZAAR) 100-25 MG tablet TAKE 1 TABLET BY MOUTH DAILY 90 tablet 3  . metFORMIN (GLUCOPHAGE) 500 MG tablet TAKE 1 TABLET(500 MG) BY MOUTH DAILY WITH BREAKFAST 90 tablet 0  . Multiple Vitamins-Calcium (ONE-A-DAY WOMENS FORMULA PO) Take 1 tablet by mouth daily.    Marland Kitchen omeprazole (PRILOSEC) 20 MG capsule omeprazole 20 mg capsule,delayed release    . ONETOUCH DELICA LANCETS FINE MISC 1 Units by Does not apply route 4 (four) times daily -  before meals and at bedtime. (Patient taking differently: 1 Units by Does not apply route 4 (four) times daily -  before meals and at bedtime.) 100 each 3  . oxyCODONE-acetaminophen (PERCOCET) 10-325 MG tablet Take 1 tablet by mouth 3 (three) times daily.    . potassium chloride (KLOR-CON) 10  MEQ tablet Take 1 tablet (10 mEq total) by mouth daily. 7 tablet 0  . topiramate (TOPAMAX) 50 MG tablet TAKE ONE TABLET BY MOUTH TWICE DAILY FOR MIGRAINES    . triamcinolone (KENALOG) 0.1 % Apply 1 application topically 2 (two) times daily. 45 g 0  . zolpidem (AMBIEN) 5 MG tablet TAKE 1 TABLET(5 MG) BY MOUTH AT BEDTIME AS NEEDED FOR SLEEP 30 tablet 0   No facility-administered medications prior to visit.    ROS See HPI  Objective:  BP 140/70 (BP Location: Left Arm, Patient Position: Sitting, Cuff Size: Large)    Pulse 100   Temp (!) 97.3 F (36.3 C) (Temporal)   Wt 152 lb 9.6 oz (69.2 kg)   SpO2 96%   BMI 27.91 kg/m   Physical Exam Abdominal:     General: There is no distension.     Palpations: Abdomen is soft. There is no mass.     Tenderness: There is no abdominal tenderness. There is no right CVA tenderness, left CVA tenderness or guarding.     Hernia: There is no hernia in the right inguinal area.  Lymphadenopathy:     Lower Body: No right inguinal adenopathy.  Neurological:     Mental Status: She is alert and oriented to person, place, and time.    Assessment & Plan:  This visit occurred during the SARS-CoV-2 public health emergency.  Safety protocols were in place, including screening questions prior to the visit, additional usage of staff PPE, and extensive cleaning of exam room while observing appropriate contact time as indicated for disinfecting solutions.   Meeya was seen today for acute visit.  Diagnoses and all orders for this visit:  Urinary frequency -     POCT urinalysis dipstick -     Urine Culture  Right groin pain -     US Pelvic Complete With Transvaginal; Future  Normal urinalysis POCT. Urine sent for culture. Increase oral hydration with water mostly (60oz per day).  Problem List Items Addressed This Visit   None   Visit Diagnoses    Urinary frequency    -  Primary   Relevant Orders   POCT urinalysis dipstick (Completed)   Urine Culture   Right groin pain       Relevant Orders   US Pelvic Complete With Transvaginal      Follow-up: No follow-ups on file.  Alysia Penna, NP

## 2020-04-15 LAB — URINE CULTURE
MICRO NUMBER:: 11632292
SPECIMEN QUALITY:: ADEQUATE

## 2020-04-19 ENCOUNTER — Ambulatory Visit (HOSPITAL_COMMUNITY)
Admission: EM | Admit: 2020-04-19 | Discharge: 2020-04-19 | Disposition: A | Discharge status: Home / Self Care | Payer: Medicare Other | Attending: Family Medicine | Admitting: Family Medicine

## 2020-04-19 ENCOUNTER — Other Ambulatory Visit: Payer: Self-pay

## 2020-04-19 ENCOUNTER — Ambulatory Visit (INDEPENDENT_AMBULATORY_CARE_PROVIDER_SITE_OTHER): Payer: Medicare Other

## 2020-04-19 ENCOUNTER — Encounter (HOSPITAL_COMMUNITY): Payer: Self-pay | Admitting: Emergency Medicine

## 2020-04-19 DIAGNOSIS — R1031 Right lower quadrant pain: Secondary | ICD-10-CM

## 2020-04-19 DIAGNOSIS — M1611 Unilateral primary osteoarthritis, right hip: Secondary | ICD-10-CM

## 2020-04-19 DIAGNOSIS — R102 Pelvic and perineal pain: Secondary | ICD-10-CM

## 2020-04-19 DIAGNOSIS — M25551 Pain in right hip: Secondary | ICD-10-CM | POA: Diagnosis not present

## 2020-04-19 LAB — POCT URINALYSIS DIPSTICK, ED / UC
Bilirubin Urine: NEGATIVE
Glucose, UA: NEGATIVE mg/dL
Hgb urine dipstick: NEGATIVE
Ketones, ur: NEGATIVE mg/dL
Leukocytes,Ua: NEGATIVE
Nitrite: NEGATIVE
Protein, ur: NEGATIVE mg/dL
Specific Gravity, Urine: 1.01 (ref 1.005–1.030)
Urobilinogen, UA: 0.2 mg/dL (ref 0.0–1.0)
pH: 7 (ref 5.0–8.0)

## 2020-04-19 MED ORDER — DEXAMETHASONE 1 MG/ML PO CONC
10.0000 mg | Freq: Once | ORAL | Status: AC
  Administered 2020-04-19: 10 mg via ORAL

## 2020-04-19 MED ORDER — DEXAMETHASONE 10 MG/ML FOR PEDIATRIC ORAL USE
INTRAMUSCULAR | Status: AC
  Filled 2020-04-19: qty 1

## 2020-04-19 MED ORDER — DEXAMETHASONE SODIUM PHOSPHATE 10 MG/ML IJ SOLN: 10.0000 mg | Freq: Once | INTRAMUSCULAR | Status: DC

## 2020-04-19 MED ORDER — DEXAMETHASONE SODIUM PHOSPHATE 10 MG/ML IJ SOLN
INTRAMUSCULAR | Status: AC
  Filled 2020-04-19: qty 1

## 2020-04-19 NOTE — Discharge Instructions (Addendum)
Continue with ultrasound scheduled for tomorrow. I am treating you today for arthritis changes of the right hip. You received a steroid shot here in clinic today.  Continue with Gabapentin as this should help with pain. I would like for you to schedule an appointment to see an orthopedic specialist. I have included the phone number for please contact their office to schedule an appointment.

## 2020-04-19 NOTE — ED Provider Notes (Signed)
[facility]    CSN: [id number] Arrival date & time: 04/19/20  [id number]      History   Chief Complaint Chief Complaint  Patient presents with  . Pelvic Pain  . Abdominal Pain    HPI Kara Mendoza is a 70 y.o. female.   HPI  Patient presents today complaining of right lower abdominal/lower right pelvic pain and urine frequency.  Patient was seen by her primary care on March 10 for the same symptoms and at that time a ultrasound vaginal complete was ordered and scheduled to be completed on March 23.  Patient also had a urinalysis collected which was negative urine culture also showed no bacterial growth.  She presents today with a complaint of the same symptoms. She endorses the urine frequency is related to drinking large amounts of water on a daily basis.  She also reports that the pain is worse when she is lying down or at rest.  The pain is not reproducible with ambulation or deep palpation.  The pain is most prominent in the right lower pelvic to groin region.  Patient denies any recent falls or any prior injuries to her right hip.  Patient does not have any recent imaging of the right hip.  She denies any nausea, vomiting or loose stools. Patient takes gabapentin chronically and reports this medication takes the edge off of the pain.  She presents today as she really wants to know what is causing her pain.  Past Medical History:  Diagnosis Date  . Abnormality of gait   . Acute bronchitis   . Allergy   . Anxiety   . Anxiety state, unspecified   . Asthma   . Chronic arm pain 2008   left  . Chronic headache   . Chronic leg pain 2008   left  . Chronic pain syndrome   . Constipation   . Diabetes mellitus (HCC)   . Dysarthria   . Esophageal reflux   . Fatigue   . History of fall   . Hyperlipidemia   . Hypertension   . Hyperthyroidism   . LOOP Recorder 10/14/2013  . Major depressive disorder, recurrent episode, moderate (HCC)   . Memory loss   . Neuromuscular  disorder (HCC)   . Osteoarthritis   . Stroke (HCC)   . Tobacco use disorder     Patient Active Problem List   Diagnosis Date Noted  . Dysuria 05/29/2018  . Residual cognitive deficit as late effect of stroke 04/26/2016  . Hemiparesis affecting left side as late effect of cerebrovascular accident (CVA) (HCC) 04/26/2016  . Carpal tunnel syndrome on left 11/29/2015  . DDD (degenerative disc disease), cervical 11/29/2015  . Depression 11/29/2015  . Chronic pain of left knee 09/04/2015  . Headache 09/04/2015  . Paroxysmal atrial fibrillation (HCC) 07/22/2015  . Complex regional pain syndrome type 2 of upper extremity 11/05/2013  . Carpal tunnel syndrome on right 10/22/2013  . Chronic embolism and thrombosis of deep vein of left upper extremity (HCC) 10/22/2013  . Cardiac device in situ 10/14/2013  . Essential hypertension 09/30/2013  . Complex regional pain syndrome type 1 of both upper extremities 09/30/2013  . Diabetes mellitus (HCC) 09/28/2013  . Hyperthyroidism 09/28/2013  . Median nerve neuritis 09/28/2013  . Ataxia, late effect of cerebrovascular disease 09/28/2013  . Spastic hemiplegia affecting dominant side (HCC) 09/28/2013  . CVA (cerebral vascular accident) (HCC) 08/15/2013  . Cerebral embolism with cerebral infarction (HCC) 08/09/2013  . Dysarthria 08/09/2013  .  Abnormality of gait 05/01/2012  . Incontinence of urine 05/01/2012  . Tobacco abuse 01/28/2012  . Tobacco dependence syndrome 01/28/2012  . Plantar fasciitis 09/01/2010  . Allergic rhinitis 07/11/2010  . Persistent cough 06/16/2010  . Asthma night-time symptoms 06/14/2010  . GERD (gastroesophageal reflux disease) 05/26/2010  . Stroke (HCC) 05/26/2010  . Hyperlipidemia 05/26/2010  . Osteoarthritis 05/26/2010  . Mononeuritis 05/26/2010    Past Surgical History:  Procedure Laterality Date  . CESAREAN SECTION     x3  . FINGER SURGERY    . LOOP RECORDER IMPLANT  08-12-2013   MDT LINQ implanted by Dr Graciela Husbands  for cryptogenic stroke  . LOOP RECORDER IMPLANT N/A 08/12/2013   Procedure: LOOP RECORDER IMPLANT;  Surgeon: Duke Salvia, MD;  Location[facility] C[facility] LAB;  Service: Cardiovascular;  Laterality: N/A;  . TEE WITHOUT CARDIOVERSION N/A 08/12/2013   Procedure: TRANSESOPHAGEAL ECHOCARDIOGRAM (TEE);  Surgeon: Wendall Stade, MD;  Location: [facility] ENDOSCOPY;  Service: Cardiovascular;  Laterality: N/A;    OB History   No obstetric history on file.      Home Medications    Prior to Admission medications   Medication Sig Start Date End Date Taking? Authorizing Provider  acetaminophen (TYLENOL) 500 MG tablet Take 2 tablets (1,000 mg total) by mouth every 6 (six) hours as needed. 11/30/15   Nche, Bonna Gains, NP  albuterol (PROVENTIL HFA;VENTOLIN HFA) 108 (90 Base) MCG/ACT inhaler Inhale 2 puffs into the lungs every 6 (six) hours as needed for wheezing or shortness of breath.    [provider]  atorvastatin (LIPITOR) 40 MG tablet TAKE 1 TABLET BY MOUTH DAILY AT 6 PM 03/04/20   Olive Bass, FNP  clopidogrel (PLAVIX) 75 MG tablet TAKE 1 TABLET(75 MG) BY MOUTH DAILY 02/07/20   Olive Bass, FNP  diclofenac Sodium (VOLTAREN) 1 % GEL Apply 2 g topically 4 (four) times daily. 05/12/19   Olive Bass, FNP  DULoxetine (CYMBALTA) 60 MG capsule TAKE 1 CAPSULE(60 MG) BY MOUTH DAILY 12/21/19   Olive Bass, FNP  fluticasone [facility]) 50 MCG/ACT nasal spray Place 1 spray into both nostrils daily. Patient taking differently: Place 1 spray into both nostrils daily as needed for allergies. 10/23/17   Evaristo Bury, NP  gabapentin (NEURONTIN) 300 MG capsule TAKE 1 CAPSULE(300 MG) BY MOUTH THREE TIMES DAILY 05/18/19   Olive Bass, FNP  Glucose Blood (BLOOD GLUCOSE TEST STRIPS) STRP Inject 1 Units into the skin 4 (four) times daily -  before meals and at bedtime. OneTouch Ultra mini strips Patient taking differently: Inject 1 Units into the skin 4 (four) times daily -   before meals and at bedtime. OneTouch Ultra mini strips 02/29/16   Nche, Bonna Gains, NP  losartan-hydrochlorothiazide (HYZAAR) 100-25 MG tablet TAKE 1 TABLET BY MOUTH DAILY 05/26/19   Olive Bass, FNP  metFORMIN (GLUCOPHAGE) 500 MG tablet TAKE 1 TABLET(500 MG) BY MOUTH DAILY WITH BREAKFAST 03/18/20   Olive Bass, FNP  Multiple Vitamins-Calcium (ONE-A-DAY WOMENS FORMULA PO) Take 1 tablet by mouth daily.    [provider]  omeprazole (PRILOSEC) 20 MG capsule omeprazole 20 mg capsule,delayed release    [provider]  ONETOUCH DELICA LANCETS FINE MISC 1 Units by Does not apply route 4 (four) times daily -  before meals and at bedtime. Patient taking differently: 1 Units by Does not apply route 4 (four) times daily -  before meals and at bedtime. 02/29/16   Nche, Bonna Gains, NP  oxyCODONE-acetaminophen (  PERCOCET) 10-325 MG tablet Take 1 tablet by mouth 3 (three) times daily. 04/09/19   [provider]  potassium chloride (KLOR-CON) 10 MEQ tablet Take 1 tablet (10 mEq total) by mouth daily. 04/04/20   Olive Bass, FNP  topiramate (TOPAMAX) 50 MG tablet TAKE ONE TABLET BY MOUTH TWICE DAILY FOR MIGRAINES 05/16/18   [provider]  triamcinolone (KENALOG) 0.1 % Apply 1 application topically 2 (two) times daily. 04/01/20   Olive Bass, FNP  zolpidem (AMBIEN) 5 MG tablet TAKE 1 TABLET(5 MG) BY MOUTH AT BEDTIME AS NEEDED FOR SLEEP 07/18/18   Olive Bass, FNP    Family History Family History  Problem Relation Age of Onset  . Cancer Father   . Diabetes Sister   . Diabetes Brother     Social History Social History   Tobacco Use  . Smoking status: Light Tobacco Smoker    Packs/day: 0.20    Years: 15.00    Pack years: 3.00    Types: Cigarettes  . Smokeless tobacco: Never Used  . Tobacco comment:  smoking 3-4 a day  Substance Use Topics  . Alcohol use: No    Alcohol/week: 0.0 standard drinks  . Drug use: No      Allergies   Aspirin, Diclofenac, and Penicillins   Review of Systems Review of Systems  Pertinent negatives listed in HPI Physical Exam Triage Vital Signs ED Triage Vitals  Enc Vitals Group     BP 04/19/20 1052 (!) 149/82     Pulse Rate 04/19/20 1052 (!) 107     Resp 04/19/20 1052 17     Temp 04/19/20 1052 99.2 F (37.3 C)     Temp Source 04/19/20 1052 Oral     SpO2 04/19/20 1052 95 %     Weight --      Height --      Head Circumference --      Peak Flow --      Pain Score 04/19/20 1049 5     Pain Loc --      Pain Edu? --      Excl. in GC? --    No data found.  Updated Vital Signs BP (!) 149/82 (BP Location: Right Arm)   Pulse (!) 107   Temp 99.2 F (37.3 C) (Oral)   Resp 17   SpO2 95%   Visual Acuity Right Eye Distance:   Left Eye Distance:   Bilateral Distance:    Right Eye Near:   Left Eye Near:    Bilateral Near:     Physical Exam Constitutional:      Appearance: She is well-developed.  HENT:     Head: Normocephalic.  Cardiovascular:     Rate and Rhythm: Normal rate and regular rhythm.  Pulmonary:     Effort: Pulmonary effort is normal.     Breath sounds: Normal breath sounds.  Abdominal:     General: Bowel sounds are normal.     Tenderness: There is no abdominal tenderness.  Skin:    General: Skin is warm.     Capillary Refill: Capillary refill takes less than 2 seconds.  Neurological:     Mental Status: She is alert.     Motor: Weakness present.     Comments: Abnormal gait and balance (chronic per EMR)  Psychiatric:        Attention and Perception: Attention normal.        Mood and Affect: Mood normal.  Speech: Speech normal.        Cognition and Memory: Cognition and memory normal.    UC Treatments / Results  Labs (all labs ordered are listed, but only abnormal results are displayed) Labs Reviewed  POCT URINALYSIS DIPSTICK, ED / UC    EKG   Radiology DG HIP UNILAT WITH PELVIS 2-3 VIEWS RIGHT  Result Date:  04/19/2020 CLINICAL DATA:  Pain EXAM: DG HIP (WITH OR WITHOUT PELVIS) 2-3V RIGHT COMPARISON:  None. FINDINGS: Frontal pelvis as well as frontal and lateral right hip images were obtained. There is no fracture or dislocation. There is slight symmetric narrowing of each hip joint. No erosive change. Sacroiliac joints appear unremarkable bilaterally. IMPRESSION: Slight symmetric narrowing each hip joint. No fracture or dislocation. Electronically Signed   By: Bretta Bang III M.D.   On: 04/19/2020 11:36    Procedures Procedures (including critical care time)  Medications Ordered in UC Medications - No data to display  Initial Impression / Assessment and Plan / UC Course  I have reviewed the triage vital signs and the nursing notes.  Pertinent labs & imaging results that were available during my care of the patient were reviewed by me and considered in my medical decision making (see chart for details).     Patient presents today with nonspecific right groin and right lower pelvic pain which is present at rest only.  Imaging of the right hip indicates narrowing of the right hip joint which is likely resulting in patient having discomfort in the lower right pelvic and groin region.  This consistent with degenerative changes involving the right hip patient was given oral Decadron here in clinic as she declined injection.  Advised did not want to start her on oral prednisone given stability of her blood sugars.  Patient is currently followed by [facility] for a separate matter advised to follow-up with [facility] for evaluation of right hip if symptoms persist following treatment received here today.  Patient is chronically prescribed gabapentin advised to continue pain management with gabapentin.  Patient verbalized understanding and agreed with plan.  Final Clinical Impressions(s) / UC Diagnoses   Final diagnoses:  Right groin pain  Primary osteoarthritis of right hip     Discharge  Instructions     Continue with ultrasound scheduled for tomorrow. I am treating you today for arthritis changes of the right hip. You received a steroid shot here in clinic today.  Continue with Gabapentin as this should help with pain. I would like for you to schedule an appointment to see an orthopedic specialist. I have included the phone number for please contact their office to schedule an appointment.    ED Prescriptions    None     PDMP not reviewed this encounter.   Bing Neighbors, FNP 04/19/20 1525

## 2020-04-19 NOTE — ED Triage Notes (Signed)
Pt presents with right side lower abdominal/ pelvic pain. States saw PCP on 3/10 and urinalysis and culture were clear.

## 2020-04-20 DIAGNOSIS — G5603 Carpal tunnel syndrome, bilateral upper limbs: Secondary | ICD-10-CM | POA: Diagnosis not present

## 2020-04-27 ENCOUNTER — Other Ambulatory Visit: Payer: Medicare Other

## 2020-04-28 ENCOUNTER — Ambulatory Visit
Admission: RE | Admit: 2020-04-28 | Discharge: 2020-04-28 | Disposition: A | Discharge status: Home / Self Care | Payer: Medicare Other | Source: Ambulatory Visit | Attending: Nurse Practitioner | Admitting: Nurse Practitioner

## 2020-04-28 DIAGNOSIS — R1031 Right lower quadrant pain: Secondary | ICD-10-CM

## 2020-04-28 DIAGNOSIS — Z780 Asymptomatic menopausal state: Secondary | ICD-10-CM | POA: Diagnosis not present

## 2020-05-03 ENCOUNTER — Other Ambulatory Visit: Payer: Self-pay | Admitting: Nurse Practitioner

## 2020-05-03 DIAGNOSIS — M1611 Unilateral primary osteoarthritis, right hip: Secondary | ICD-10-CM

## 2020-05-03 DIAGNOSIS — R1031 Right lower quadrant pain: Secondary | ICD-10-CM

## 2020-05-09 ENCOUNTER — Ambulatory Visit: Payer: Medicare Other | Admitting: Physician Assistant

## 2020-05-11 DIAGNOSIS — M79603 Pain in arm, unspecified: Secondary | ICD-10-CM | POA: Diagnosis not present

## 2020-05-11 DIAGNOSIS — M25519 Pain in unspecified shoulder: Secondary | ICD-10-CM | POA: Diagnosis not present

## 2020-05-11 DIAGNOSIS — G894 Chronic pain syndrome: Secondary | ICD-10-CM | POA: Diagnosis not present

## 2020-05-11 DIAGNOSIS — G90519 Complex regional pain syndrome I of unspecified upper limb: Secondary | ICD-10-CM | POA: Diagnosis not present

## 2020-05-16 ENCOUNTER — Ambulatory Visit: Payer: Self-pay

## 2020-05-16 ENCOUNTER — Encounter: Payer: Self-pay | Admitting: Physician Assistant

## 2020-05-16 ENCOUNTER — Ambulatory Visit (INDEPENDENT_AMBULATORY_CARE_PROVIDER_SITE_OTHER): Payer: Medicare Other | Admitting: Physician Assistant

## 2020-05-16 DIAGNOSIS — M19011 Primary osteoarthritis, right shoulder: Secondary | ICD-10-CM

## 2020-05-16 DIAGNOSIS — M7061 Trochanteric bursitis, right hip: Secondary | ICD-10-CM

## 2020-05-16 NOTE — Progress Notes (Signed)
Office Visit Note   Patient: Kara Mendoza           Date of Birth: [birth year]           MRN: [id number] Visit Date: 05/16/2020              Requested by: Anne Ng, NP [address] [place],  [place] [address] PCP: Olive Bass, FNP   Assessment & Plan: Visit Diagnoses:  1. Primary osteoarthritis of right shoulder   2. Trochanteric bursitis, right hip     Plan:  Patient would like to stay as conservative as possible.  She would like to go to physical therapy for her right hip and right shoulder.  I discussed with her that I do feel that the physical therapy for the right hip definitely could help the trochanteric bursitis.  I have a slightly pessimistic that the therapy will help with the right shoulder but I am happy for her to have therapy for the shoulder also.  She is given a prescription for therapy for the shoulder and hip no work on IT band stretching, modalities home exercise program, range of motion the right shoulder strengthening her right shoulder and modalities to the right shoulder.  Follow-Up Instructions: Return in about 4 weeks (around 06/13/2020).   Orders:  Orders Placed This Encounter  Procedures  . XR Shoulder Right   No orders of the defined types were placed in this encounter.     Procedures: No procedures performed   Clinical Data: No additional findings.   Subjective: Chief Complaint  Patient presents with  . Right Hip - Pain  . Right Shoulder - Pain    HPI Kara Mendoza is a pleasant 70 year old female were seen for the first time for right hip pain and right shoulder pain.  She states she has had pain in the shoulder and hip for last few months.  No known injury.  She was seen in the ER due to hip pain radiographs were obtained.  Personally reviewed the right hip films.  No acute fracture.  Bilateral hips are well located.  Hips overall well-preserved.  She notes the pain is lateral aspect the hip is worse when lying  on the hip.  She does note some groin pain. Right shoulder pain with decreased range of motion.  She notes numbness tingling bilateral hands states she is to undergo surgery soon for this numbness tingling in her hands.  But otherwise no radicular symptoms down either arm.  She is diabetic last hemoglobin A1c was 6.6 on 04/01/2020.  Review of Systems Negative for fevers chills.  Objective: Vital Signs: There were no vitals taken for this visit.  Physical Exam Constitutional:      Appearance: She is not ill-appearing or diaphoretic.  Neurological:     Mental Status: She is alert and oriented to person, place, and time.  Psychiatric:        Mood and Affect: Mood normal.     Ortho Exam Bilateral hips good range of motion of both hips without pain.  Tenderness over the right hip trochanteric region. Bilateral shoulders: Significant crepitus with passive range of motion of the right shoulder.  She has 5 out of 5 strength external/internal rotation against resistance empty can test is negative bilaterally.  Liftoff test negative bilaterally. Specialty Comments:  No specialty comments available.  Imaging: XR Shoulder Right  Result Date: 05/16/2020 AP view Y view of the right shoulder shows the shoulder to be well  located.  There is periarticular spurring and downsloping acromion.  Axillary view shows complete loss of the joint space.  No acute fractures.    PMFS History: Patient Active Problem List   Diagnosis Date Noted  . Dysuria 05/29/2018  . Residual cognitive deficit as late effect of stroke 04/26/2016  . Hemiparesis affecting left side as late effect of cerebrovascular accident (CVA) (HCC) 04/26/2016  . Carpal tunnel syndrome on left 11/29/2015  . DDD (degenerative disc disease), cervical 11/29/2015  . Depression 11/29/2015  . Chronic pain of left knee 09/04/2015  . Headache 09/04/2015  . Paroxysmal atrial fibrillation (HCC) 07/22/2015  . Complex regional pain syndrome type  2 of upper extremity 11/05/2013  . Carpal tunnel syndrome on right 10/22/2013  . Chronic embolism and thrombosis of deep vein of left upper extremity (HCC) 10/22/2013  . Cardiac device in situ 10/14/2013  . Essential hypertension 09/30/2013  . Complex regional pain syndrome type 1 of both upper extremities 09/30/2013  . Diabetes mellitus (HCC) 09/28/2013  . Hyperthyroidism 09/28/2013  . Median nerve neuritis 09/28/2013  . Ataxia, late effect of cerebrovascular disease 09/28/2013  . Spastic hemiplegia affecting dominant side (HCC) 09/28/2013  . CVA (cerebral vascular accident) (HCC) 08/15/2013  . Cerebral embolism with cerebral infarction (HCC) 08/09/2013  . Dysarthria 08/09/2013  . Abnormality of gait 05/01/2012  . Incontinence of urine 05/01/2012  . Tobacco abuse 01/28/2012  . Tobacco dependence syndrome 01/28/2012  . Plantar fasciitis 09/01/2010  . Allergic rhinitis 07/11/2010  . Persistent cough 06/16/2010  . Asthma night-time symptoms 06/14/2010  . GERD (gastroesophageal reflux disease) 05/26/2010  . Stroke (HCC) 05/26/2010  . Hyperlipidemia 05/26/2010  . Osteoarthritis 05/26/2010  . Mononeuritis 05/26/2010   Past Medical History:  Diagnosis Date  . Abnormality of gait   . Acute bronchitis   . Allergy   . Anxiety   . Anxiety state, unspecified   . Asthma   . Chronic arm pain 2008   left  . Chronic headache   . Chronic leg pain 2008   left  . Chronic pain syndrome   . Constipation   . Diabetes mellitus (HCC)   . Dysarthria   . Esophageal reflux   . Fatigue   . History of fall   . Hyperlipidemia   . Hypertension   . Hyperthyroidism   . LOOP Recorder 10/14/2013  . Major depressive disorder, recurrent episode, moderate (HCC)   . Memory loss   . Neuromuscular disorder (HCC)   . Osteoarthritis   . Stroke (HCC)   . Tobacco use disorder     Family History  Problem Relation Age of Onset  . Cancer Father   . Diabetes Sister   . Diabetes Brother     Past  Surgical History:  Procedure Laterality Date  . CESAREAN SECTION     x3  . FINGER SURGERY    . LOOP RECORDER IMPLANT  08-12-2013   MDT LINQ implanted by Dr Graciela Husbands for cryptogenic stroke  . LOOP RECORDER IMPLANT N/A 08/12/2013   Procedure: LOOP RECORDER IMPLANT;  Surgeon: Duke Salvia, MD;  Location: [facility] CATH LAB;  Service: Cardiovascular;  Laterality: N/A;  . TEE WITHOUT CARDIOVERSION N/A 08/12/2013   Procedure: TRANSESOPHAGEAL ECHOCARDIOGRAM (TEE);  Surgeon: Wendall Stade, MD;  Location: [facility] ENDOSCOPY;  Service: Cardiovascular;  Laterality: N/A;   Social History   Occupational History    Employer: OTHER    Comment: Disabled  Tobacco Use  . Smoking status: Light Tobacco Smoker    Packs/day: 0.20  Years: 15.00    Pack years: 3.00    Types: Cigarettes  . Smokeless tobacco: Never Used  . Tobacco comment:  smoking 3-4 a day  Substance and Sexual Activity  . Alcohol use: No    Alcohol/week: 0.0 standard drinks  . Drug use: No  . Sexual activity: Not on file

## 2020-05-17 ENCOUNTER — Telehealth: Payer: Self-pay | Admitting: Orthopaedic Surgery

## 2020-05-17 NOTE — Telephone Encounter (Signed)
Patient states she would like you to call her and explain what you told her in the office

## 2020-05-17 NOTE — Telephone Encounter (Signed)
Patient called requesting a call back. Patient states she has medical questions. Please call patient at [phone].

## 2020-05-18 ENCOUNTER — Telehealth: Payer: Self-pay | Admitting: Orthopaedic Surgery

## 2020-05-18 NOTE — Telephone Encounter (Signed)
Can we make her an appointment with August Saucer please?

## 2020-06-02 ENCOUNTER — Other Ambulatory Visit: Payer: Self-pay | Admitting: Family

## 2020-06-02 DIAGNOSIS — Z1231 Encounter for screening mammogram for malignant neoplasm of breast: Secondary | ICD-10-CM

## 2020-06-03 ENCOUNTER — Telehealth: Payer: Self-pay | Admitting: Family

## 2020-06-03 ENCOUNTER — Other Ambulatory Visit: Payer: Self-pay | Admitting: Family

## 2020-06-03 DIAGNOSIS — E876 Hypokalemia: Secondary | ICD-10-CM

## 2020-06-03 DIAGNOSIS — F339 Major depressive disorder, recurrent, unspecified: Secondary | ICD-10-CM

## 2020-06-03 DIAGNOSIS — E1140 Type 2 diabetes mellitus with diabetic neuropathy, unspecified: Secondary | ICD-10-CM

## 2020-06-03 DIAGNOSIS — I10 Essential (primary) hypertension: Secondary | ICD-10-CM

## 2020-06-03 DIAGNOSIS — G5643 Causalgia of bilateral upper limbs: Secondary | ICD-10-CM

## 2020-06-03 NOTE — Telephone Encounter (Signed)
She needs to get her potassium level re-checked; order in place at [facility] and she should do this in the next week if possible.

## 2020-06-03 NOTE — Telephone Encounter (Signed)
I have called the pt and informed her of the information from the provider. She stated that it was too far and she wants a new doctor at the [facility] location. I have given her the phone numbers and the address for both [facility] and [facility]. I have also given her the address of the old [facility] office so she can get her labs drawn as well.

## 2020-06-06 ENCOUNTER — Encounter: Payer: Self-pay | Admitting: Orthopedic Surgery

## 2020-06-06 ENCOUNTER — Ambulatory Visit (INDEPENDENT_AMBULATORY_CARE_PROVIDER_SITE_OTHER): Payer: Medicare Other | Admitting: Orthopedic Surgery

## 2020-06-06 DIAGNOSIS — M19011 Primary osteoarthritis, right shoulder: Secondary | ICD-10-CM

## 2020-06-07 ENCOUNTER — Encounter: Payer: Self-pay | Admitting: Orthopedic Surgery

## 2020-06-07 NOTE — Progress Notes (Signed)
Office Visit Note   Patient: Kara Mendoza           Date of Birth: 23-Nov-1950           MRN: [id number] Visit Date: 06/06/2020 Requested by: Olive Bass, FNP [address] [place],  [place] [address] PCP: Olive Bass, FNP  Subjective: Chief Complaint  Patient presents with  . Right Shoulder - Pain  . Left Shoulder - Pain    HPI: [place] is a 70 year old patient with bilateral shoulder pain right worse than left.  Was last seen by Rexene Edison on 05/16/2020.  Radiographs done at that time which show fairly significant acromial spurring good maintenance of the acromiohumeral distance and no significant glenohumeral joint arthritis.  Pain has been going on for 3 months.  Pain is getting worse.  She describes limited range of motion and she cannot do certain movements.  She is on oxycodone prescribed by pain clinic.  She is emphatic that she does not want any injections.  She has known history of carpal tunnel syndrome affecting left and right hand.  She may be scheduled for surgical intervention for the left hand in the near future.              ROS: All systems reviewed are negative as they relate to the chief complaint within the history of present illness.  Patient denies  fevers or chills.   Assessment & Plan: Visit Diagnoses:  1. Primary osteoarthritis of right shoulder     Plan: Impression is right shoulder pain with fairly reasonable rotator cuff strength and no definite glenohumeral joint arthritis.  I is possible she may have a small rotator cuff tear or possibly early frozen shoulder.  She may also have some occult arthritis not visible on radiographs.  Plan is for MRI arthrogram of the right shoulder to evaluate for bony and soft tissue pathology due to failure of conservative management and 3 months of symptoms with night pain rest pain and pain which interferes with activities of daily living.  Continue with topical anti-inflammatories and we  will see her back after that study  Follow-Up Instructions: Return for after MRI.   Orders:  Orders Placed This Encounter  Procedures  . MR SHOULDER RIGHT W CONTRAST  . Arthrogram   No orders of the defined types were placed in this encounter.     Procedures: No procedures performed   Clinical Data: No additional findings.  Objective: Vital Signs: There were no vitals taken for this visit.  Physical Exam:   Constitutional: Patient appears well-developed HEENT:  Head: Normocephalic Eyes:EOM are normal Neck: Normal range of motion Cardiovascular: Normal rate Pulmonary/chest: Effort normal Neurologic: Patient is alert Skin: Skin is warm Psychiatric: Patient has normal mood and affect    Ortho Exam: Ortho exam demonstrates good cervical spine range of motion.  Palpable radial pulses bilaterally.  She has had a stroke affecting that right side but in general her strength is pretty reasonable to infraspinatus supraspinatus subscap muscle testing.  Lacks about 10 degrees of motion on the right compared to the left.  No definite bone-on-bone coarseness with passive range of motion of the right shoulder.  She has a little bit of coarseness potentially consistent with small rotator cuff tear.  No muscle atrophy right arm versus left.  No discrete AC joint tenderness right versus left.  No scapular dyskinesia with forward flexion bilaterally.  Specialty Comments:  No specialty comments available.  Imaging:  No results found.   PMFS History: Patient Active Problem List   Diagnosis Date Noted  . Dysuria 05/29/2018  . Residual cognitive deficit as late effect of stroke 04/26/2016  . Hemiparesis affecting left side as late effect of cerebrovascular accident (CVA) (HCC) 04/26/2016  . Carpal tunnel syndrome on left 11/29/2015  . DDD (degenerative disc disease), cervical 11/29/2015  . Depression 11/29/2015  . Chronic pain of left knee 09/04/2015  . Headache 09/04/2015  .  Paroxysmal atrial fibrillation (HCC) 07/22/2015  . Complex regional pain syndrome type 2 of upper extremity 11/05/2013  . Carpal tunnel syndrome on right 10/22/2013  . Chronic embolism and thrombosis of deep vein of left upper extremity (HCC) 10/22/2013  . Cardiac device in situ 10/14/2013  . Essential hypertension 09/30/2013  . Complex regional pain syndrome type 1 of both upper extremities 09/30/2013  . Diabetes mellitus (HCC) 09/28/2013  . Hyperthyroidism 09/28/2013  . Median nerve neuritis 09/28/2013  . Ataxia, late effect of cerebrovascular disease 09/28/2013  . Spastic hemiplegia affecting dominant side (HCC) 09/28/2013  . CVA (cerebral vascular accident) (HCC) 08/15/2013  . Cerebral embolism with cerebral infarction (HCC) 08/09/2013  . Dysarthria 08/09/2013  . Abnormality of gait 05/01/2012  . Incontinence of urine 05/01/2012  . Tobacco abuse 01/28/2012  . Tobacco dependence syndrome 01/28/2012  . Plantar fasciitis 09/01/2010  . Allergic rhinitis 07/11/2010  . Persistent cough 06/16/2010  . Asthma night-time symptoms 06/14/2010  . GERD (gastroesophageal reflux disease) 05/26/2010  . Stroke (HCC) 05/26/2010  . Hyperlipidemia 05/26/2010  . Osteoarthritis 05/26/2010  . Mononeuritis 05/26/2010   Past Medical History:  Diagnosis Date  . Abnormality of gait   . Acute bronchitis   . Allergy   . Anxiety   . Anxiety state, unspecified   . Asthma   . Chronic arm pain 2008   left  . Chronic headache   . Chronic leg pain 2008   left  . Chronic pain syndrome   . Constipation   . Diabetes mellitus (HCC)   . Dysarthria   . Esophageal reflux   . Fatigue   . History of fall   . Hyperlipidemia   . Hypertension   . Hyperthyroidism   . LOOP Recorder 10/14/2013  . Major depressive disorder, recurrent episode, moderate (HCC)   . Memory loss   . Neuromuscular disorder (HCC)   . Osteoarthritis   . Stroke (HCC)   . Tobacco use disorder     Family History  Problem Relation Age  of Onset  . Cancer Father   . Diabetes Sister   . Diabetes Brother     Past Surgical History:  Procedure Laterality Date  . CESAREAN SECTION     x3  . FINGER SURGERY    . LOOP RECORDER IMPLANT  08-12-2013   MDT LINQ implanted by Dr Graciela Husbands for cryptogenic stroke  . LOOP RECORDER IMPLANT N/A 08/12/2013   Procedure: LOOP RECORDER IMPLANT;  Surgeon: Duke Salvia, MD;  Location: [facility] CATH LAB;  Service: Cardiovascular;  Laterality: N/A;  . TEE WITHOUT CARDIOVERSION N/A 08/12/2013   Procedure: TRANSESOPHAGEAL ECHOCARDIOGRAM (TEE);  Surgeon: Wendall Stade, MD;  Location: [facility] ENDOSCOPY;  Service: Cardiovascular;  Laterality: N/A;   Social History   Occupational History    Employer: OTHER    Comment: Disabled  Tobacco Use  . Smoking status: Light Tobacco Smoker    Packs/day: 0.20    Years: 15.00    Pack years: 3.00    Types: Cigarettes  . Smokeless tobacco: Never  Used  . Tobacco comment:  smoking 3-4 a day  Substance and Sexual Activity  . Alcohol use: No    Alcohol/week: 0.0 standard drinks  . Drug use: No  . Sexual activity: Not on file

## 2020-06-09 DIAGNOSIS — Z79899 Other long term (current) drug therapy: Secondary | ICD-10-CM | POA: Diagnosis not present

## 2020-06-09 DIAGNOSIS — G5602 Carpal tunnel syndrome, left upper limb: Secondary | ICD-10-CM | POA: Diagnosis not present

## 2020-06-09 DIAGNOSIS — M1812 Unilateral primary osteoarthritis of first carpometacarpal joint, left hand: Secondary | ICD-10-CM | POA: Diagnosis not present

## 2020-06-09 DIAGNOSIS — G894 Chronic pain syndrome: Secondary | ICD-10-CM | POA: Diagnosis not present

## 2020-06-09 DIAGNOSIS — G90519 Complex regional pain syndrome I of unspecified upper limb: Secondary | ICD-10-CM | POA: Diagnosis not present

## 2020-06-09 DIAGNOSIS — Z79891 Long term (current) use of opiate analgesic: Secondary | ICD-10-CM | POA: Diagnosis not present

## 2020-06-22 DIAGNOSIS — G5603 Carpal tunnel syndrome, bilateral upper limbs: Secondary | ICD-10-CM | POA: Diagnosis not present

## 2020-06-23 ENCOUNTER — Other Ambulatory Visit: Payer: Self-pay | Admitting: Orthopedic Surgery

## 2020-06-24 DIAGNOSIS — H5203 Hypermetropia, bilateral: Secondary | ICD-10-CM | POA: Diagnosis not present

## 2020-06-24 DIAGNOSIS — H52223 Regular astigmatism, bilateral: Secondary | ICD-10-CM | POA: Diagnosis not present

## 2020-06-24 DIAGNOSIS — H524 Presbyopia: Secondary | ICD-10-CM | POA: Diagnosis not present

## 2020-06-24 DIAGNOSIS — H2513 Age-related nuclear cataract, bilateral: Secondary | ICD-10-CM | POA: Diagnosis not present

## 2020-07-15 ENCOUNTER — Emergency Department (HOSPITAL_COMMUNITY): Payer: Medicare Other

## 2020-07-15 ENCOUNTER — Encounter (HOSPITAL_COMMUNITY): Payer: Self-pay | Admitting: Emergency Medicine

## 2020-07-15 ENCOUNTER — Other Ambulatory Visit: Payer: Self-pay

## 2020-07-15 ENCOUNTER — Inpatient Hospital Stay (HOSPITAL_COMMUNITY)
Admission: EM | Admit: 2020-07-15 | Discharge: 2020-07-19 | DRG: 065 | Disposition: A | Discharge status: Skilled Nursing Facility | Payer: Medicare Other | Attending: Internal Medicine | Admitting: Internal Medicine

## 2020-07-15 DIAGNOSIS — J45909 Unspecified asthma, uncomplicated: Secondary | ICD-10-CM | POA: Diagnosis not present

## 2020-07-15 DIAGNOSIS — Z880 Allergy status to penicillin: Secondary | ICD-10-CM

## 2020-07-15 DIAGNOSIS — I69354 Hemiplegia and hemiparesis following cerebral infarction affecting left non-dominant side: Secondary | ICD-10-CM

## 2020-07-15 DIAGNOSIS — I6389 Other cerebral infarction: Secondary | ICD-10-CM | POA: Diagnosis not present

## 2020-07-15 DIAGNOSIS — R29818 Other symptoms and signs involving the nervous system: Secondary | ICD-10-CM | POA: Diagnosis not present

## 2020-07-15 DIAGNOSIS — Z7984 Long term (current) use of oral hypoglycemic drugs: Secondary | ICD-10-CM

## 2020-07-15 DIAGNOSIS — K219 Gastro-esophageal reflux disease without esophagitis: Secondary | ICD-10-CM | POA: Diagnosis not present

## 2020-07-15 DIAGNOSIS — I639 Cerebral infarction, unspecified: Secondary | ICD-10-CM

## 2020-07-15 DIAGNOSIS — F17210 Nicotine dependence, cigarettes, uncomplicated: Secondary | ICD-10-CM | POA: Diagnosis present

## 2020-07-15 DIAGNOSIS — G9389 Other specified disorders of brain: Secondary | ICD-10-CM | POA: Diagnosis not present

## 2020-07-15 DIAGNOSIS — I63412 Cerebral infarction due to embolism of left middle cerebral artery: Principal | ICD-10-CM | POA: Diagnosis present

## 2020-07-15 DIAGNOSIS — F32A Depression, unspecified: Secondary | ICD-10-CM | POA: Diagnosis not present

## 2020-07-15 DIAGNOSIS — E785 Hyperlipidemia, unspecified: Secondary | ICD-10-CM | POA: Diagnosis present

## 2020-07-15 DIAGNOSIS — M19011 Primary osteoarthritis, right shoulder: Secondary | ICD-10-CM | POA: Diagnosis not present

## 2020-07-15 DIAGNOSIS — Z20822 Contact with and (suspected) exposure to covid-19: Secondary | ICD-10-CM | POA: Diagnosis not present

## 2020-07-15 DIAGNOSIS — I480 Paroxysmal atrial fibrillation: Secondary | ICD-10-CM | POA: Diagnosis present

## 2020-07-15 DIAGNOSIS — R4701 Aphasia: Secondary | ICD-10-CM | POA: Diagnosis not present

## 2020-07-15 DIAGNOSIS — Z7902 Long term (current) use of antithrombotics/antiplatelets: Secondary | ICD-10-CM | POA: Diagnosis not present

## 2020-07-15 DIAGNOSIS — I63212 Cerebral infarction due to unspecified occlusion or stenosis of left vertebral arteries: Secondary | ICD-10-CM | POA: Diagnosis not present

## 2020-07-15 DIAGNOSIS — M19021 Primary osteoarthritis, right elbow: Secondary | ICD-10-CM | POA: Diagnosis not present

## 2020-07-15 DIAGNOSIS — I69323 Fluency disorder following cerebral infarction: Secondary | ICD-10-CM

## 2020-07-15 DIAGNOSIS — Z79899 Other long term (current) drug therapy: Secondary | ICD-10-CM

## 2020-07-15 DIAGNOSIS — R233 Spontaneous ecchymoses: Secondary | ICD-10-CM | POA: Diagnosis present

## 2020-07-15 DIAGNOSIS — Z888 Allergy status to other drugs, medicaments and biological substances status: Secondary | ICD-10-CM

## 2020-07-15 DIAGNOSIS — Z8673 Personal history of transient ischemic attack (TIA), and cerebral infarction without residual deficits: Secondary | ICD-10-CM | POA: Diagnosis not present

## 2020-07-15 DIAGNOSIS — I10 Essential (primary) hypertension: Secondary | ICD-10-CM | POA: Diagnosis not present

## 2020-07-15 DIAGNOSIS — Z833 Family history of diabetes mellitus: Secondary | ICD-10-CM

## 2020-07-15 DIAGNOSIS — Z0389 Encounter for observation for other suspected diseases and conditions ruled out: Secondary | ICD-10-CM | POA: Diagnosis not present

## 2020-07-15 DIAGNOSIS — G8929 Other chronic pain: Secondary | ICD-10-CM | POA: Diagnosis present

## 2020-07-15 DIAGNOSIS — F419 Anxiety disorder, unspecified: Secondary | ICD-10-CM | POA: Diagnosis present

## 2020-07-15 DIAGNOSIS — R0902 Hypoxemia: Secondary | ICD-10-CM | POA: Diagnosis not present

## 2020-07-15 DIAGNOSIS — E119 Type 2 diabetes mellitus without complications: Secondary | ICD-10-CM | POA: Diagnosis present

## 2020-07-15 DIAGNOSIS — R519 Headache, unspecified: Secondary | ICD-10-CM | POA: Diagnosis not present

## 2020-07-15 DIAGNOSIS — R471 Dysarthria and anarthria: Secondary | ICD-10-CM | POA: Diagnosis present

## 2020-07-15 DIAGNOSIS — R29705 NIHSS score 5: Secondary | ICD-10-CM | POA: Diagnosis present

## 2020-07-15 DIAGNOSIS — I63512 Cerebral infarction due to unspecified occlusion or stenosis of left middle cerebral artery: Secondary | ICD-10-CM | POA: Diagnosis not present

## 2020-07-15 DIAGNOSIS — R4182 Altered mental status, unspecified: Secondary | ICD-10-CM

## 2020-07-15 DIAGNOSIS — Z882 Allergy status to sulfonamides status: Secondary | ICD-10-CM | POA: Diagnosis not present

## 2020-07-15 DIAGNOSIS — G319 Degenerative disease of nervous system, unspecified: Secondary | ICD-10-CM | POA: Diagnosis not present

## 2020-07-15 DIAGNOSIS — F809 Developmental disorder of speech and language, unspecified: Secondary | ICD-10-CM | POA: Diagnosis present

## 2020-07-15 DIAGNOSIS — M47816 Spondylosis without myelopathy or radiculopathy, lumbar region: Secondary | ICD-10-CM | POA: Diagnosis not present

## 2020-07-15 LAB — RAPID URINE DRUG SCREEN, HOSP PERFORMED
Amphetamines: NOT DETECTED
Barbiturates: NOT DETECTED
Benzodiazepines: NOT DETECTED
Cocaine: NOT DETECTED
Opiates: NOT DETECTED
Tetrahydrocannabinol: NOT DETECTED

## 2020-07-15 LAB — URINALYSIS, ROUTINE W REFLEX MICROSCOPIC
Bacteria, UA: NONE SEEN
Bilirubin Urine: NEGATIVE
Glucose, UA: NEGATIVE mg/dL
Hgb urine dipstick: NEGATIVE
Ketones, ur: 80 mg/dL — AB
Nitrite: NEGATIVE
Protein, ur: NEGATIVE mg/dL
Specific Gravity, Urine: 1.021 (ref 1.005–1.030)
pH: 7 (ref 5.0–8.0)

## 2020-07-15 LAB — CBC
HCT: 48.3 % — ABNORMAL HIGH (ref 36.0–46.0)
Hemoglobin: 15.8 g/dL — ABNORMAL HIGH (ref 12.0–15.0)
MCH: 29.8 pg (ref 26.0–34.0)
MCHC: 32.7 g/dL (ref 30.0–36.0)
MCV: 91.1 fL (ref 80.0–100.0)
Platelets: 407 10*3/uL — ABNORMAL HIGH (ref 150–400)
RBC: 5.3 MIL/uL — ABNORMAL HIGH (ref 3.87–5.11)
RDW: 12.3 % (ref 11.5–15.5)
WBC: 10.2 10*3/uL (ref 4.0–10.5)
nRBC: 0 % (ref 0.0–0.2)

## 2020-07-15 LAB — PROTIME-INR
INR: 1 (ref 0.8–1.2)
Prothrombin Time: 13.1 seconds (ref 11.4–15.2)

## 2020-07-15 LAB — COMPREHENSIVE METABOLIC PANEL
ALT: 31 U/L (ref 0–44)
AST: 25 U/L (ref 15–41)
Albumin: 3.9 g/dL (ref 3.5–5.0)
Alkaline Phosphatase: 71 U/L (ref 38–126)
Anion gap: 10 (ref 5–15)
BUN: 8 mg/dL (ref 8–23)
CO2: 26 mmol/L (ref 22–32)
Calcium: 10.3 mg/dL (ref 8.9–10.3)
Chloride: 102 mmol/L (ref 98–111)
Creatinine, Ser: 0.76 mg/dL (ref 0.44–1.00)
GFR, Estimated: >60 mL/min (ref >60)
Glucose, Bld: 152 mg/dL — ABNORMAL HIGH (ref 70–99)
Potassium: 4.1 mmol/L (ref 3.5–5.1)
Sodium: 138 mmol/L (ref 135–145)
Total Bilirubin: 1 mg/dL (ref 0.3–1.2)
Total Protein: 6.8 g/dL (ref 6.5–8.1)

## 2020-07-15 LAB — RESP PANEL BY RT-PCR (FLU A&B, COVID) ARPGX2
Influenza A by PCR: NEGATIVE
Influenza B by PCR: NEGATIVE
SARS Coronavirus 2 by RT PCR: NEGATIVE

## 2020-07-15 LAB — DIFFERENTIAL
Abs Immature Granulocytes: 0.05 10*3/uL (ref 0.00–0.07)
Basophils Absolute: 0 10*3/uL (ref 0.0–0.1)
Basophils Relative: 0 %
Eosinophils Absolute: 0 10*3/uL (ref 0.0–0.5)
Eosinophils Relative: 0 %
Immature Granulocytes: 1 %
Lymphocytes Relative: 14 %
Lymphs Abs: 1.4 10*3/uL (ref 0.7–4.0)
Monocytes Absolute: 0.6 10*3/uL (ref 0.1–1.0)
Monocytes Relative: 6 %
Neutro Abs: 8.1 10*3/uL — ABNORMAL HIGH (ref 1.7–7.7)
Neutrophils Relative %: 79 %

## 2020-07-15 LAB — ETHANOL: Alcohol, Ethyl (B): <10 mg/dL (ref <10)

## 2020-07-15 LAB — APTT: aPTT: 27 seconds (ref 24–36)

## 2020-07-15 MED ORDER — ACETAMINOPHEN 160 MG/5ML PO SOLN: 650.0000 mg | ORAL | Status: DC | PRN

## 2020-07-15 MED ORDER — ENOXAPARIN SODIUM 40 MG/0.4ML IJ SOSY
40.0000 mg | PREFILLED_SYRINGE | INTRAMUSCULAR | Status: DC
  Administered 2020-07-15: 40 mg via SUBCUTANEOUS
  Filled 2020-07-15 (×2): qty 0.4

## 2020-07-15 MED ORDER — FENTANYL CITRATE (PF) 100 MCG/2ML IJ SOLN
12.5000 ug | Freq: Once | INTRAMUSCULAR | Status: AC
  Administered 2020-07-15: 12.5 ug via INTRAVENOUS
  Filled 2020-07-15: qty 2

## 2020-07-15 MED ORDER — STROKE: EARLY STAGES OF RECOVERY BOOK
Freq: Once | Status: AC
  Filled 2020-07-15 (×2): qty 1

## 2020-07-15 MED ORDER — LABETALOL HCL 5 MG/ML IV SOLN: 10.0000 mg | INTRAVENOUS | Status: DC | PRN

## 2020-07-15 MED ORDER — SODIUM CHLORIDE 0.9 % IV BOLUS
500.0000 mL | Freq: Once | INTRAVENOUS | Status: AC
  Administered 2020-07-15: 500 mL via INTRAVENOUS

## 2020-07-15 MED ORDER — GABAPENTIN 300 MG PO CAPS
300.0000 mg | ORAL_CAPSULE | Freq: Three times a day (TID) | ORAL | Status: DC
  Administered 2020-07-15 – 2020-07-19 (×11): 300 mg via ORAL
  Filled 2020-07-15 (×11): qty 1

## 2020-07-15 MED ORDER — SENNOSIDES-DOCUSATE SODIUM 8.6-50 MG PO TABS: 1.0000 | ORAL_TABLET | Freq: Every evening | ORAL | Status: DC | PRN

## 2020-07-15 MED ORDER — DULOXETINE HCL 60 MG PO CPEP
60.0000 mg | ORAL_CAPSULE | Freq: Every day | ORAL | Status: DC
  Administered 2020-07-16 – 2020-07-19 (×4): 60 mg via ORAL
  Filled 2020-07-15 (×4): qty 1

## 2020-07-15 MED ORDER — CLOPIDOGREL BISULFATE 75 MG PO TABS
75.0000 mg | ORAL_TABLET | Freq: Every day | ORAL | Status: DC
  Administered 2020-07-16 – 2020-07-19 (×4): 75 mg via ORAL
  Filled 2020-07-15 (×4): qty 1

## 2020-07-15 MED ORDER — ATORVASTATIN CALCIUM 40 MG PO TABS
40.0000 mg | ORAL_TABLET | Freq: Every day | ORAL | Status: DC
  Filled 2020-07-15: qty 1

## 2020-07-15 MED ORDER — OXYCODONE-ACETAMINOPHEN 10-325 MG PO TABS: 1.0000 | ORAL_TABLET | Freq: Four times a day (QID) | ORAL | Status: DC | PRN

## 2020-07-15 MED ORDER — OXYCODONE HCL 5 MG PO TABS
5.0000 mg | ORAL_TABLET | Freq: Four times a day (QID) | ORAL | Status: DC | PRN
Start: 2020-07-15 — End: 2020-07-19
  Administered 2020-07-17 – 2020-07-19 (×4): 5 mg via ORAL
  Filled 2020-07-15 (×4): qty 1

## 2020-07-15 MED ORDER — ALBUTEROL SULFATE (2.5 MG/3ML) 0.083% IN NEBU: 2.5000 mg | INHALATION_SOLUTION | Freq: Four times a day (QID) | RESPIRATORY_TRACT | Status: DC | PRN

## 2020-07-15 MED ORDER — ACETAMINOPHEN 650 MG RE SUPP: 650.0000 mg | RECTAL | Status: DC | PRN

## 2020-07-15 MED ORDER — OXYCODONE-ACETAMINOPHEN 5-325 MG PO TABS
1.0000 | ORAL_TABLET | Freq: Four times a day (QID) | ORAL | Status: DC | PRN
  Administered 2020-07-19: 1 via ORAL
  Filled 2020-07-15: qty 1

## 2020-07-15 MED ORDER — ACETAMINOPHEN 325 MG PO TABS: 650.0000 mg | ORAL_TABLET | ORAL | Status: DC | PRN

## 2020-07-15 MED ORDER — SODIUM CHLORIDE 0.9 % IV SOLN: INTRAVENOUS | Status: DC

## 2020-07-15 NOTE — ED Triage Notes (Signed)
Patient presents to the ED by EMS with c/o From home where family reports "she has not been acting right for 2 days." Decreased activity, decreased ambulation and appetite. Normally completes ADLs on her own, now having incontenince. Hx of strokes with residual stuttering. No unilateral weakness. VSS. Alert to self which is not baseline.

## 2020-07-15 NOTE — Consult Note (Signed)
NEUROLOGY CONSULTATION NOTE   Date of service: July 15, 2020 Patient Name: Kara Mendoza MRN:  [id number] DOB:  [birth year] Reason for consult: "L MCA stroke" Requesting Provider: Charlsie Quest, MD _ _ _   _ __   _ __ _ _  __ __   _ __   __ _  History of Present Illness  Kara Mendoza is a 70 y.o. female with PMH significant for anxiety, asthma, chronic headaches, diabetes, hyperlipidemia, hypertension, prior left MCA stroke with residual language impairment and known pAfibb (noted on loop recorder in 2017) was offered Xarelto but declined per notes who presents with 2 day history of worsening speech and decreased ambulation. MRI Brain with scattered acute L MCA stroke. MRA with severe LMCA distal M1 stenosis.  She has significant aphasia on exam with perseveration and uable to provide any meaningful history.  mRS: 2 tPA/Thrombectomy: outside window NIHSS components Score: Comment  1a Level of Conscious 0[x]  1[]  2[]  3[]      1b LOC Questions 0[]  1[]  2[x]       1c LOC Commands 0[]  1[x]  2[]       2 Best Gaze 0[x]  1[]  2[]       3 Visual 0[x]  1[]  2[]  3[]      4 Facial Palsy 0[x]  1[]  2[]  3[]      5a Motor Arm - left 0[x]  1[]  2[]  3[]  4[]  UN[]    5b Motor Arm - Right 0[x]  1[]  2[]  3[]  4[]  UN[]    6a Motor Leg - Left 0[x]  1[]  2[]  3[]  4[]  UN[]    6b Motor Leg - Right 0[x]  1[]  2[]  3[]  4[]  UN[]    7 Limb Ataxia 0[x]  1[]  2[]  3[]  UN[]     8 Sensory 0[x]  1[]  2[]  UN[]      9 Best Language 0[]  1[]  2[x]  3[]      10 Dysarthria 0[x]  1[]  2[]  UN[]      11 Extinct. and Inattention 0[x]  1[]  2[]       TOTAL: 5      ROS   Unable to obtain a detailed ROS 2/2 aphasia.  Past History   Past Medical History:  Diagnosis Date   Abnormality of gait    Acute bronchitis    Allergy    Anxiety    Anxiety state, unspecified    Asthma    Chronic arm pain 2008   left   Chronic headache    Chronic leg pain 2008   left   Chronic pain syndrome    Constipation    Diabetes mellitus (HCC)    Dysarthria    Esophageal  reflux    Fatigue    History of fall    Hyperlipidemia    Hypertension    Hyperthyroidism    LOOP Recorder 10/14/2013   Major depressive disorder, recurrent episode, moderate (HCC)    Memory loss    Neuromuscular disorder (HCC)    Osteoarthritis    Stroke (HCC)    Tobacco use disorder    Past Surgical History:  Procedure Laterality Date   CESAREAN SECTION     x3   FINGER SURGERY     LOOP RECORDER IMPLANT  08-12-2013   MDT LINQ implanted by Dr Graciela Husbands for cryptogenic stroke   LOOP RECORDER IMPLANT N/A 08/12/2013   Procedure: LOOP RECORDER IMPLANT;  Surgeon: Duke Salvia, MD;  Location: [facility] CATH LAB;  Service: Cardiovascular;  Laterality: N/A;   TEE WITHOUT CARDIOVERSION N/A 08/12/2013   Procedure: TRANSESOPHAGEAL ECHOCARDIOGRAM (TEE);  Surgeon: Wendall Stade, MD;  Location: [facility] ENDOSCOPY;  Service:  Cardiovascular;  Laterality: N/A;   Family History  Problem Relation Age of Onset   Cancer Father    Diabetes Sister    Diabetes Brother    Social History   Socioeconomic History   Marital status: Single    Spouse name: Not on file   Number of children: 3   Years of education: College   Highest education level: Some college, no degree  Occupational History    Employer: OTHER    Comment: Disabled  Tobacco Use   Smoking status: Light Smoker    Packs/day: 0.20    Years: 15.00    Pack years: 3.00    Types: Cigarettes   Smokeless tobacco: Never   Tobacco comments:     smoking 3-4 a day  Substance and Sexual Activity   Alcohol use: No    Alcohol/week: 0.0 standard drinks   Drug use: No   Sexual activity: Not on file  Other Topics Concern   Not on file  Social History Narrative   Patient is single with 3 children.   Patient is right handed.   Patient has college education.   Patient drinks 2 cups daily.      Patient lives in 2nd floor apartment. Has 17 steps to climb.    Social Determinants of Health   Financial Resource Strain: Low Risk    Difficulty of Paying Living  Expenses: Not hard at all  Food Insecurity: No Food Insecurity   Worried About Programme researcher, broadcasting/film/video in the Last Year: Never true   Ran Out of Food in the Last Year: Never true  Transportation Needs: No Transportation Needs   Lack of Transportation (Medical): No   Lack of Transportation (Non-Medical): No  Physical Activity: Inactive   Days of Exercise per Week: 0 days   Minutes of Exercise per Session: 0 min  Stress: No Stress Concern Present   Feeling of Stress : Not at all  Social Connections: Moderately Integrated   Frequency of Communication with Friends and Family: More than three times a week   Frequency of Social Gatherings with Friends and Family: More than three times a week   Attends Religious Services: 1 to 4 times per year   Active Member of [employer] or Organizations: No   Attends Engineer, structural: 1 to 4 times per year   Marital Status: Never married   Allergies  Allergen Reactions   Aspirin Hives, Itching and Other (See Comments)   Diclofenac Shortness Of Breath    Only with po diclofenac   Penicillins Hives    Has patient had a PCN reaction causing immediate rash, facial/tongue/throat swelling, SOB or lightheadedness with hypotension: Yes Has patient had a PCN reaction causing severe rash involving mucus membranes or skin necrosis: No Has patient had a PCN reaction that required hospitalization No Has patient had a PCN reaction occurring within the last 10 years: No If all of the above answers are "NO", then may proceed with Cephalosporin use.     Medications  (Not in a hospital admission)    Vitals   Vitals:   07/15/20 2015 07/15/20 2030 07/15/20 2045 07/15/20 2100  BP: (!) 155/70 (!) 161/76 (!) 148/76 (!) 162/74  Pulse:    89  Resp: 14 (!) 21 17 (!) 21  Temp:    98.7 F (37.1 C)  TempSrc:    Oral  SpO2:    100%     There is no height or weight on file to calculate BMI.  Physical Exam   General: Laying comfortably in bed; in no acute  distress.  HENT: Normal oropharynx and mucosa. Normal external appearance of ears and nose.  Neck: Supple, no pain or tenderness  CV: No JVD. No peripheral edema.  Pulmonary: Symmetric Chest rise. Normal respiratory effort.  Abdomen: Soft to touch, non-tender.  Ext: No cyanosis, edema, or deformity  Skin: No rash. Normal palpation of skin.   Musculoskeletal: Normal digits and nails by inspection. No clubbing.  Neurologic Examination  Mental status/Cognition: Alert, oriented to self, limited by aphasia. Speech/language: Fluent, unable to comprehend most commands, names a few but not all objects,unable to repeat. Cranial nerves:   CN II Pupils equal and reactive to light, no VF deficits    CN III,IV,VI EOM intact, no gaze preference or deviation, no nystagmus    CN V normal sensation in V1, V2, and V3 segments bilaterally    CN VII no asymmetry, no nasolabial fold flattening    CN VIII normal hearing to speech    CN IX & X normal palatal elevation, no uvular deviation    CN XI 5/5 head turn and 5/5 shoulder shrug bilaterally    CN XII midline tongue protrusion    Motor:  Muscle bulk: normal, tone normal, pronator drift RUE pronator drift Mvmt Root Nerve  Muscle Right Left Comments  SA C5/6 Ax Deltoid 5 5   EF C5/6 Mc Biceps 5 5   EE C6/7/8 Rad Triceps 5 5   WF C6/7 Med FCR     WE C7/8 PIN ECU     F Ab C8/T1 U ADM/FDI 5 5   HF L1/2/3 Fem Illopsoas 5 5   KE L2/3/4 Fem Quad 5 5   DF L4/5 D Peron Tib Ant 5 5   PF S1/2 Tibial Grc/Sol 5 5    Reflexes:  Right Left Comments  Pectoralis      Biceps (C5/6) 2 2   Brachioradialis (C5/6) 2 2    Triceps (C6/7) 2 2    Patellar (L3/4) 2 2    Achilles (S1)      Hoffman      Plantar     Jaw jerk    Sensation:  Light touch intact   Pin prick    Temperature    Vibration   Proprioception    Coordination/Complex Motor:  - Finger to Nose intact BL - Heel to shin unable to comprehend how to do it. - Rapid alternating movement are  normal - Gait: unsafe to assess given strokes.  Labs   CBC:  Recent Labs  Lab 07/15/20 1321  WBC 10.2  NEUTROABS 8.1*  HGB 15.8*  HCT 48.3*  MCV 91.1  PLT 407*    Basic Metabolic Panel:  Lab Results  Component Value Date   NA 138 07/15/2020   K 4.1 07/15/2020   CO2 26 07/15/2020   GLUCOSE 152 (H) 07/15/2020   BUN 8 07/15/2020   CREATININE 0.76 07/15/2020   CALCIUM 10.3 07/15/2020   GFRNONAA >60 07/15/2020   GFRAA >60 01/29/2018   Lipid Panel:  Lab Results  Component Value Date   LDLCALC 106 (H) 11/25/2019   HgbA1c:  Lab Results  Component Value Date   HGBA1C 6.4 04/01/2020   Urine Drug Screen:     Component Value Date/Time   LABOPIA NONE DETECTED 12/07/2015 2231   COCAINSCRNUR NONE DETECTED 12/07/2015 2231   COCAINSCRNUR NEG 04/30/2012 1533   LABBENZ NONE DETECTED 12/07/2015 2231   AMPHETMU NONE DETECTED 12/07/2015  2231   THCU NONE DETECTED 12/07/2015 2231   LABBARB NONE DETECTED 12/07/2015 2231    Alcohol Level     Component Value Date/Time   ETH <10 07/15/2020 1321    CT Head without contrast: Personally reviewed and CTH was negative for a large hypodensity concerning for a large territory infarct or hyperdensity concerning for an ICH  MRI HEAD:(personally reviewed)   1. Scattered acute ischemic cortical infarct involving the left frontotemporal region. Associated mild petechial hemorrhage without frank hemorrhagic transformation or significant mass effect. 2. Underlying chronic left MCA distribution infarct with moderate chronic microvascular ischemic disease.   MRA HEAD:(personally reviewed)   1. Negative MRA for large vessel occlusion. 2. Severe stenoses involving the distal left M1 segment, left MCA bifurcation, and adjacent proximal M2 branches, with probable involvement of both superior and inferior divisions. 3. Moderate stenosis at the vertebrobasilar junction. 4. Nonvisualization of the left vertebral artery.   MRA NECK:   1.  Technically limited exam by motion and lack of IV contrast. 2. Patency of both carotid artery systems within the neck without appreciable stenosis or other acute vascular abnormality. 3. Strongly dominant right vertebral artery patent within the neck. Hypoplastic left vertebral artery not well seen, and may be occluded/partially occluded.  Impression   Kara Mendoza is a 70 y.o. female with PMH significant for anxiety, asthma, chronic headaches, diabetes, hyperlipidemia, hypertension, prior left MCA stroke with residual language impairment and known pAfibb (noted on loop recorder in 2017) was offered Xarelto but declined per notes who presents with 2 day history of worsening speech and decreased ambulation. MRI Brain with scattered acute L MCA stroke. MRA with severe LMCA distal M1 stenosis.  Primary Diagnosis:  Cerebral infarction due to embolism of  left middle cerebral artery.   Secondary Diagnosis: Essential (primary) hypertension and Type 2 diabetes mellitus w/o complications  Recommendations  Plan:  - Frequent Neuro checks per stroke unit protocol - Recommend brain imaging with MRI Brain without contrast - Recommend Vascular imaging with MRA Angio Head without contrast and US Carotid doppler - Recommend obtaining TTE pending. - Recommend obtaining Lipid panel with LDL - Please start statin if LDL > 70 - Recommend HbA1c - [facility] the [facility]. Can do plavix for now. [facility] stroke team. - Recommend DVT ppx, stop when [facility]. - SBP goal - permissive hypertension first 24 h < 220/110. Held home meds.  - Recommend Telemetry monitoring for arrythmia - Recommend bedside swallow screen prior to PO intake. - Stroke education booklet - Recommend PT/OT/SLP consult - Recommend Urine Tox screen. - Loop recorder interrogation.  ______________________________________________________________________   Thank you for the opportunity to  take part in the care of this patient. If you have any further questions, please contact the neurology consultation attending.  Signed,  Erick BlinksSalman Mohannad Olivero [facility] Pager Number [phone] _ _ _   _ __   _ __ _ _  __ __   _ __   __ _

## 2020-07-15 NOTE — ED Provider Notes (Signed)
The patient has an acute ischemic stroke based on MRI on diffusion-weighted imaging, I discussed the case with Dr. Selina Cooley with the hospitalist neurologist as well as with the general hospitalist who will admit.   Eber Hong, MD 07/15/20 [phone]

## 2020-07-15 NOTE — ED Notes (Signed)
Attempted to give reportx1 

## 2020-07-15 NOTE — ED Notes (Signed)
ED Provider at bedside. 

## 2020-07-15 NOTE — ED Notes (Signed)
Patient transported to MRI 

## 2020-07-15 NOTE — ED Notes (Signed)
Son [phone] wants an update

## 2020-07-15 NOTE — ED Notes (Signed)
Pt back from MRI. Patient noted to be tearful and looks uncomfortable. Patient states she is in pain.

## 2020-07-15 NOTE — ED Notes (Signed)
Patient in MRI 

## 2020-07-15 NOTE — Progress Notes (Signed)
Pt admitted from ED with Stroke diagnosis, pt alert to self only but follows simple command, settled in bed with call light within pt's reach, tele monitor put and verified on pt, safety measures initiated accordingly, pt reassured and will continue to monitor, v/s stable. Kara Mendoza, [name]

## 2020-07-15 NOTE — H&P (Signed)
History and Physical    Kara Mendoza:[id number] DOB: April 15, 1950 DOA: 07/15/2020  PCP: Olive Bass, FNP  Patient coming from: Home via EMS  I have personally briefly reviewed patient's old medical records in [facility]  Chief Complaint: Altered level activity, change in speech  HPI: [name] is a 70 y.o. female with medical history significant for history of stroke with residual speech stuttering change, hypertension, hyperlipidemia, GERD, asthma, depression/anxiety, and chronic pain who presented to the ED for evaluation of change in activity and speech.  History is limited from patient due to apparent expressive aphasia and is otherwise obtained from EDP and chart review.  Patient has had change in level activity and behavior over the last 2 days.  She has had decreased appetite and ambulation.  She is reported to have residual stuttering speech from a prior stroke but now her speech is significantly changed from her baseline.  She has appeared to have difficulty formulating words.  She has not had any focal weakness.  Last known normal was 2 days ago.  Patient does appear to exhibit difficulty with expressing her words.  She has occasional repetition of words.  She is following commands without issue.  ED Course:  Initial vitals show BP 150/79, pulse 87, RR 16, temp 99.1 F, SPO2 95% on room air.  Labs show sodium 138, potassium 4.1, bicarb 26, BUN 8, creatinine 0.76, LFTs within normal limits, serum glucose 152, WBC 10.2, hemoglobin 15.8, platelets 407,000, INR 1.0, serum ethanol <10.  Urinalysis and UDS ordered and pending collection.  SARS-CoV-2 PCR negative.  Influenza A/B PCR are negative.  CT head without contrast shows stable old left parietal infarct without acute intracranial finding.  Slight progression in ventricular dilatation without secondary signs of obstructive hydrocephalus noted.  Right humerus and forearm x-rays negative for acute  findings.  Portable chest x-ray shows loop recorder over the left lower chest without focal consolidation, edema, or effusion.  Abdominal x-ray negative for retained metallic foreign body.  MRI brain without contrast and MRA head/neck without contrast were completed with official read pending.  EDP was notified of acute ischemic stroke in area of prior infarct.  Neurology were consulted and will see.  The hospitalist service was consulted to admit for further evaluation and management.  While in the ED, patient received 500 cc normal saline.  Review of Systems: All systems reviewed and are negative except as documented in history of present illness above.   Past Medical History:  Diagnosis Date   Abnormality of gait    Acute bronchitis    Allergy    Anxiety    Anxiety state, unspecified    Asthma    Chronic arm pain 2008   left   Chronic headache    Chronic leg pain 2008   left   Chronic pain syndrome    Constipation    Diabetes mellitus (HCC)    Dysarthria    Esophageal reflux    Fatigue    History of fall    Hyperlipidemia    Hypertension    Hyperthyroidism    LOOP Recorder 10/14/2013   Major depressive disorder, recurrent episode, moderate (HCC)    Memory loss    Neuromuscular disorder (HCC)    Osteoarthritis    Stroke (HCC)    Tobacco use disorder     Past Surgical History:  Procedure Laterality Date   CESAREAN SECTION     x3   FINGER SURGERY  LOOP RECORDER IMPLANT  08-12-2013   MDT LINQ implanted by Dr Graciela HusbandsKlein for cryptogenic stroke   LOOP RECORDER IMPLANT N/A 08/12/2013   Procedure: LOOP RECORDER IMPLANT;  Surgeon: Duke SalviaSteven C Klein, MD;  [facility];  Service: Cardiovascular;  Laterality: N/A;   TEE WITHOUT CARDIOVERSION N/A 08/12/2013   Procedure: TRANSESOPHAGEAL ECHOCARDIOGRAM (TEE);  Surgeon: Wendall StadePeter C Nishan, MD;  [facility];  Service: Cardiovascular;  Laterality: N/A;    Social History:  reports that she has been smoking cigarettes. She  has a 3.00 pack-year smoking history. She has never used smokeless tobacco. She reports that she does not drink alcohol and does not use drugs.  Allergies  Allergen Reactions   Aspirin Hives, Itching and Other (See Comments)   Diclofenac Shortness Of Breath    Only with po diclofenac   Penicillins Hives    Has patient had a PCN reaction causing immediate rash, facial/tongue/throat swelling, SOB or lightheadedness with hypotension: Yes Has patient had a PCN reaction causing severe rash involving mucus membranes or skin necrosis: No Has patient had a PCN reaction that required hospitalization No Has patient had a PCN reaction occurring within the last 10 years: No If all of the above answers are "NO", then may proceed with Cephalosporin use.     Family History  Problem Relation Age of Onset   Cancer Father    Diabetes Sister    Diabetes Brother      Prior to Admission medications   Medication Sig Start Date End Date Taking? Authorizing Provider  acetaminophen (TYLENOL) 500 MG tablet Take 2 tablets (1,000 mg total) by mouth every 6 (six) hours as needed. 11/30/15  Yes Nche, Bonna Gainsharlotte Lum, NP  albuterol (PROVENTIL HFA;VENTOLIN HFA) 108 (90 Base) MCG/ACT inhaler Inhale 2 puffs into the lungs every 6 (six) hours as needed for wheezing or shortness of breath.   Yes [provider]  atorvastatin (LIPITOR) 40 MG tablet TAKE 1 TABLET BY MOUTH DAILY AT 6 PM Patient taking differently: Take 40 mg by mouth daily. 03/04/20  Yes Olive BassMurray, Laura Woodruff, FNP  clopidogrel (PLAVIX) 75 MG tablet TAKE 1 TABLET(75 MG) BY MOUTH DAILY Patient taking differently: Take 75 mg by mouth daily. 02/07/20  Yes Olive BassMurray, Laura Woodruff, FNP  diclofenac Sodium (VOLTAREN) 1 % GEL Apply 2 g topically 4 (four) times daily. 05/12/19  Yes Olive BassMurray, Laura Woodruff, FNP  DULoxetine (CYMBALTA) 60 MG capsule TAKE 1 CAPSULE(60 MG) BY MOUTH DAILY Patient taking differently: Take 60 mg by mouth daily. 06/03/20  Yes Olive BassMurray, Laura  Woodruff, FNP  fluticasone [facility](FLONASE) 50 MCG/ACT nasal spray Place 1 spray into both nostrils daily. Patient taking differently: Place 1 spray into both nostrils daily as needed for allergies. 10/23/17  Yes Shambley, Audie BoxAshleigh N, NP  gabapentin (NEURONTIN) 300 MG capsule TAKE 1 CAPSULE(300 MG) BY MOUTH THREE TIMES DAILY Patient taking differently: Take 300 mg by mouth 3 (three) times daily. 05/18/19  Yes Olive BassMurray, Laura Woodruff, FNP  losartan-hydrochlorothiazide [facility](HYZAAR) 100-25 MG tablet TAKE 1 TABLET BY MOUTH DAILY 06/03/20  Yes Olive BassMurray, Laura Woodruff, FNP  metFORMIN (GLUCOPHAGE) 500 MG tablet TAKE 1 TABLET(500 MG) BY MOUTH DAILY WITH BREAKFAST Patient taking differently: Take 500 mg by mouth daily with breakfast. 06/03/20  Yes Olive BassMurray, Laura Woodruff, FNP  Multiple Vitamins-Calcium (ONE-A-DAY WOMENS FORMULA PO) Take 1 tablet by mouth daily.   Yes [provider]  omeprazole (PRILOSEC) 20 MG capsule TAKE 1 CAPSULE(20 MG) BY MOUTH TWICE DAILY Patient taking differently: Take 20 mg by mouth  2 (two) times daily before a meal. 06/03/20  Yes Olive Bass, FNP  oxyCODONE-acetaminophen (PERCOCET) 10-325 MG tablet Take 1 tablet by mouth every 6 (six) hours as needed for pain. 04/09/19  Yes [provider]  potassium chloride (KLOR-CON) 10 MEQ tablet Take 1 tablet (10 mEq total) by mouth daily. 04/04/20  Yes Olive Bass, FNP  topiramate (TOPAMAX) 50 MG tablet Take 50 mg by mouth 2 (two) times daily as needed (migraines). 05/16/18  Yes [provider]  triamcinolone (KENALOG) 0.1 % Apply 1 application topically 2 (two) times daily. 04/01/20  Yes Olive Bass, FNP  zolpidem (AMBIEN) 5 MG tablet TAKE 1 TABLET(5 MG) BY MOUTH AT BEDTIME AS NEEDED FOR SLEEP 07/18/18  Yes Olive Bass, FNP  Glucose Blood (BLOOD GLUCOSE TEST STRIPS) STRP Inject 1 Units into the skin 4 (four) times daily -  before meals and at bedtime. OneTouch Ultra mini strips Patient taking  differently: Inject 1 Units into the skin 4 (four) times daily -  before meals and at bedtime. OneTouch Ultra mini strips 02/29/16   Nche, Bonna Gains, NP  ONETOUCH DELICA LANCETS FINE MISC 1 Units by Does not apply route 4 (four) times daily -  before meals and at bedtime. Patient taking differently: 1 Units by Does not apply route 4 (four) times daily -  before meals and at bedtime. 02/29/16   Anne Ng, NP    Physical Exam: Vitals:   07/15/20 1530 07/15/20 1645 07/15/20 1715 07/15/20 1730  BP: (!) 154/91 (!) 154/80 (!) 149/73 (!) 148/75  Pulse: 72 84 74 80  Resp: 15 13 14 14   Temp:      TempSrc:      SpO2: 99% 97% 99% 96%   Constitutional: Resting supine in bed, NAD, calm, comfortable Eyes: PERRL, lids and conjunctivae normal ENMT: Mucous membranes are moist. Posterior pharynx clear of any exudate or lesions.Normal dentition.  Neck: normal, supple, no masses. Respiratory: clear to auscultation bilaterally, no wheezing, no crackles. Normal respiratory effort. No accessory muscle use.  Cardiovascular: Regular rate and rhythm, no murmurs / rubs / gallops. No extremity edema. 2+ pedal pulses. Abdomen: no tenderness, no masses palpated. No hepatosplenomegaly. Bowel sounds positive.  Musculoskeletal: no clubbing / cyanosis. No joint deformity upper and lower extremities. Good ROM, no contractures. Normal muscle tone.  Skin: no rashes, lesions, ulcers. No induration Neurologic: Apparent expressive aphasia with repetition of words otherwise CN 2-12 grossly intact. Sensation intact. Strength 5/5 in all 4.  Psychiatric: Awake, alert, and following commands.  Orientation questioning limited due to expressive aphasia.  Labs on Admission: I have personally reviewed following labs and imaging studies  CBC: Recent Labs  Lab 07/15/20 1321  WBC 10.2  NEUTROABS 8.1*  HGB 15.8*  HCT 48.3*  MCV 91.1  PLT 407*   Basic Metabolic Panel: Recent Labs  Lab 07/15/20 1321  NA 138  K 4.1   CL 102  CO2 26  GLUCOSE 152*  BUN 8  CREATININE 0.76  CALCIUM 10.3   GFR: CrCl cannot be calculated (Unknown ideal weight.). Liver Function Tests: Recent Labs  Lab 07/15/20 1321  AST 25  ALT 31  ALKPHOS 71  BILITOT 1.0  PROT 6.8  ALBUMIN 3.9   No results for input(s): LIPASE, AMYLASE in the last 168 hours. No results for input(s): AMMONIA in the last 168 hours. Coagulation Profile: Recent Labs  Lab 07/15/20 1321  INR 1.0   Cardiac Enzymes: No results for input(s): CKTOTAL, CKMB,  CKMBINDEX, TROPONINI in the last 168 hours. BNP (last 3 results) No results for input(s): PROBNP in the last 8760 hours. HbA1C: No results for input(s): HGBA1C in the last 72 hours. CBG: No results for input(s): GLUCAP in the last 168 hours. Lipid Profile: No results for input(s): CHOL, HDL, LDLCALC, TRIG, CHOLHDL, LDLDIRECT in the last 72 hours. Thyroid Function Tests: No results for input(s): TSH, T4TOTAL, FREET4, T3FREE, THYROIDAB in the last 72 hours. Anemia Panel: No results for input(s): VITAMINB12, FOLATE, FERRITIN, TIBC, IRON, RETICCTPCT in the last 72 hours. Urine analysis:    Component Value Date/Time   COLORURINE YELLOW 04/01/2020 1429   APPEARANCEUR CLEAR 04/01/2020 1429   LABSPEC 1.010 04/19/2020 1104   PHURINE 7.0 04/19/2020 1104   GLUCOSEU NEGATIVE 04/19/2020 1104   GLUCOSEU NEGATIVE 04/01/2020 1429   HGBUR NEGATIVE 04/19/2020 1104   BILIRUBINUR NEGATIVE 04/19/2020 1104   BILIRUBINUR negative 04/14/2020 1500   KETONESUR NEGATIVE 04/19/2020 1104   PROTEINUR NEGATIVE 04/19/2020 1104   UROBILINOGEN 0.2 04/19/2020 1104   NITRITE NEGATIVE 04/19/2020 1104   LEUKOCYTESUR NEGATIVE 04/19/2020 1104    Radiological Exams on Admission: DG Chest 1 View  Result Date: 07/15/2020 CLINICAL DATA:  Altered mental status. EXAM: CHEST  1 VIEW COMPARISON:  Radiographs 10/23/2017. FINDINGS: 1350 hours. Lower lung volumes. The heart size and mediastinal contours are stable. The lungs  appear clear. There is no pleural effusion or pneumothorax. Loop recorder overlies the left lower chest. There are mild degenerative changes at both shoulders with a calcification adjacent to the left greater tuberosity which could reflect hydroxyapatite deposition. No acute osseous findings. Telemetry leads overlie the chest. IMPRESSION: No active cardiopulmonary process. Possible left shoulder calcific tendinitis/bursitis. Electronically Signed   By: Carey Bullocks M.D.   On: 07/15/2020 14:18   DG Forearm Right  Result Date: 07/15/2020 CLINICAL DATA:  Altered mental status.  History of stroke. EXAM: RIGHT FOREARM - 2 VIEW; RIGHT HUMERUS - 2+ VIEW COMPARISON:  None. FINDINGS: No evidence of acute fracture or dislocation. There are mild degenerative changes at the shoulder and elbow. No significant elbow joint effusion. Ulnar minus variance noted at the wrist. There is an IV in the antecubital fossa. No focal soft tissue abnormality or foreign body identified. IMPRESSION: No acute findings identified within the right upper arm or forearm. Mild degenerative changes. Electronically Signed   By: Carey Bullocks M.D.   On: 07/15/2020 14:20   DG Abdomen 1 View  Result Date: 07/15/2020 CLINICAL DATA:  Rule out metallic foreign body prior to MRI EXAM: ABDOMEN - 1 VIEW COMPARISON:  None. FINDINGS: Loop recorder is noted in the left anterior chest wall. Scattered large and small bowel gas is noted. No retained metallic foreign body is identified. Degenerative changes of lumbar spine are noted. IMPRESSION: No retained metallic foreign body identified. Loop recorder in the left chest wall is noted. Electronically Signed   By: Alcide Clever M.D.   On: 07/15/2020 17:27   CT HEAD WO CONTRAST  Result Date: 07/15/2020 CLINICAL DATA:  Neuro deficit, acute stroke suspected. Mental status change. Dysphagia for 2 days. EXAM: CT HEAD WITHOUT CONTRAST TECHNIQUE: Contiguous axial images were obtained from the base of the skull  through the vertex without intravenous contrast. COMPARISON:  CT head 01/29/2018. FINDINGS: Brain: There is no evidence of acute intracranial hemorrhage, mass lesion, brain edema or extra-axial fluid collection. Chronic encephalomalacia in the left parietal lobe appears stable, consistent with an old cortical and subcortical infarct. There is no CT evidence of  acute cortical infarction. Mild progression of ventriculomegaly. Vascular: Mild intracranial vascular calcifications. No hyperdense vessel identified. Skull: Negative for fracture or focal lesion. Mild calvarial hyperostosis. Sinuses/Orbits: Minimal dependent mucosal thickening in the left division of the sphenoid sinus. The additional visualized paranasal sinuses are clear without air-fluid levels. The mastoid air cells and middle ears are clear. Other: None. IMPRESSION: 1. No definite acute intracranial findings. Stable old left parietal infarct. 2. Slight progression in ventricular dilatation without secondary signs of obstructive hydrocephalus. Electronically Signed   By: Carey Bullocks M.D.   On: 07/15/2020 14:16   DG Humerus Right  Result Date: 07/15/2020 CLINICAL DATA:  Altered mental status.  History of stroke. EXAM: RIGHT FOREARM - 2 VIEW; RIGHT HUMERUS - 2+ VIEW COMPARISON:  None. FINDINGS: No evidence of acute fracture or dislocation. There are mild degenerative changes at the shoulder and elbow. No significant elbow joint effusion. Ulnar minus variance noted at the wrist. There is an IV in the antecubital fossa. No focal soft tissue abnormality or foreign body identified. IMPRESSION: No acute findings identified within the right upper arm or forearm. Mild degenerative changes. Electronically Signed   By: Carey Bullocks M.D.   On: 07/15/2020 14:20    EKG: Personally reviewed. Sinus rhythm, RAE.  Not significantly changed when compared to prior.  Assessment/Plan Principal Problem:   Acute CVA (cerebrovascular accident) [facility]) Active  Problems:   Hyperlipidemia   Essential hypertension   MARYKATE HEUBERGER is a 70 y.o. female with medical history significant for history of stroke with residual speech stuttering change, hypertension, hyperlipidemia, GERD, asthma, depression/anxiety, and chronic pain who is admitted with acute CVA.  Acute CVA with history of left parietal stroke: Presenting with apparent expressive aphasia and decreased activity.  Has been on Plavix as an outpatient. -MRI brain shows scattered acute ischemic cortical infarct involving the left frontotemporal region -MRA head negative for large vessel occlusion.  Severe stenosis involving the distal left M1, left MCA bifurcation, and adjacent proximal M2 branches noted. -MRA neck limited exam due to motion and lack of IV contrast, no stenosis of bilateral carotid arteries appreciated.  Possible occluded/partially occluded left vertebral artery reported. -Obtain echocardiogram -Continue Plavix 75 mg daily for now, may need anticoagulation per neurology going forward -PT/OT/SLP eval -Continue atorvastatin -Check lipid panel, A1c -Monitor on telemetry, continue neurochecks  Hypertension: Holding home antihypertensives to allow for permissive hypertension for now.  Hyperlipidemia: Continue atorvastatin.  Prediabetes: Holding home metformin.  Check A1c.  Asthma: Currently stable.  Albuterol as needed.  Depression/anxiety: Continue Cymbalta.  Chronic pain: Continue home Percocet only as needed with hold parameters.  Continue gabapentin.   DVT prophylaxis: Lovenox Code Status: Full code Family Communication: None present on admission Disposition Plan: From home, dispo pending clinical progress Consults called: Neurology Level of care: Telemetry Medical Admission status:  Status is: Observation  The patient remains OBS appropriate and will d/c before 2 midnights.  Dispo: The patient is from: Home              Anticipated d/c is to:  Home versus  SNF              Patient currently is not medically stable to d/c.   Difficult to place patient No  Darreld Mclean MD [facility]  If 7PM-7AM, please contact night-coverage [email or web address]  07/15/2020, 7:22 PM

## 2020-07-15 NOTE — ED Provider Notes (Addendum)
[facility]   CSN: [id number] Arrival date & time: 07/15/20  1237     History No chief complaint on file.   Kara Mendoza is a 70 y.o. female.  Patient brought in by EMS.  Patient is from home family reports she not been acting right for 2 days.  Decreased activity decreased ambulation and appetite.  Patient has a history of strokes with residual stuttering.  But they are saying her speech is markedly different at this time where she is having significant trouble with formulating words.  EMS reported no unilateral weakness.  Patient was last normal 2 days ago.  Patient not tPA candidate.  But symptoms could be possibly stroke related.  Or could be dehydration.  Or could be infection.  Patient's temp upon arrival here 99.1.  Heart rate 78 respirations 13 blood pressure 160/75 oxygen saturation on room air is 100%.  Patient with essential difficulty speaking.  I would not call it stuttering.  Also on initial screening exam seem to have some subtle right upper extremity left lower extremity weakness.  Not sure if this is baseline or not.  Patient's review note review from cardiology shows the patient had a loop recorder placed.  Definite notes describing it is that during the time that she had had the stroke.  They use this to confirm that she was having some atrial fibrillation.  And her past medical history because of the loop recorder as well.      Past Medical History:  Diagnosis Date   Abnormality of gait    Acute bronchitis    Allergy    Anxiety    Anxiety state, unspecified    Asthma    Chronic arm pain 2008   left   Chronic headache    Chronic leg pain 2008   left   Chronic pain syndrome    Constipation    Diabetes mellitus (HCC)    Dysarthria    Esophageal reflux    Fatigue    History of fall    Hyperlipidemia    Hypertension    Hyperthyroidism    LOOP Recorder 10/14/2013   Major depressive disorder,  recurrent episode, moderate (HCC)    Memory loss    Neuromuscular disorder (HCC)    Osteoarthritis    Stroke (HCC)    Tobacco use disorder     Patient Active Problem List   Diagnosis Date Noted   Dysuria 05/29/2018   Residual cognitive deficit as late effect of stroke 04/26/2016   Hemiparesis affecting left side as late effect of cerebrovascular accident (CVA) (HCC) 04/26/2016   Carpal tunnel syndrome on left 11/29/2015   DDD (degenerative disc disease), cervical 11/29/2015   Depression 11/29/2015   Chronic pain of left knee 09/04/2015   Headache 09/04/2015   Paroxysmal atrial fibrillation (HCC) 07/22/2015   Complex regional pain syndrome type 2 of upper extremity 11/05/2013   Carpal tunnel syndrome on right 10/22/2013   Chronic embolism and thrombosis of deep vein of left upper extremity (HCC) 10/22/2013   Cardiac device in situ 10/14/2013   Essential hypertension 09/30/2013   Complex regional pain syndrome type 1 of both upper extremities 09/30/2013   Diabetes mellitus (HCC) 09/28/2013   Hyperthyroidism 09/28/2013   Median nerve neuritis 09/28/2013   Ataxia, late effect of cerebrovascular disease 09/28/2013   Spastic hemiplegia affecting dominant side (HCC) 09/28/2013   CVA (cerebral vascular accident) (HCC) 08/15/2013   Cerebral embolism with cerebral infarction (  HCC) 08/09/2013   Dysarthria 08/09/2013   Abnormality of gait 05/01/2012   Incontinence of urine 05/01/2012   Tobacco abuse 01/28/2012   Tobacco dependence syndrome 01/28/2012   Plantar fasciitis 09/01/2010   Allergic rhinitis 07/11/2010   Persistent cough 06/16/2010   Asthma night-time symptoms 06/14/2010   GERD (gastroesophageal reflux disease) 05/26/2010   Stroke (HCC) 05/26/2010   Hyperlipidemia 05/26/2010   Osteoarthritis 05/26/2010   Mononeuritis 05/26/2010    Past Surgical History:  Procedure Laterality Date   CESAREAN SECTION     x3   FINGER SURGERY     LOOP RECORDER IMPLANT  08-12-2013   MDT  LINQ implanted by Dr Graciela Husbands for cryptogenic stroke   LOOP RECORDER IMPLANT N/A 08/12/2013   Procedure: LOOP RECORDER IMPLANT;  Surgeon: Duke Salvia, MD;  Location: [facility] CATH LAB;  Service: Cardiovascular;  Laterality: N/A;   TEE WITHOUT CARDIOVERSION N/A 08/12/2013   Procedure: TRANSESOPHAGEAL ECHOCARDIOGRAM (TEE);  Surgeon: Wendall Stade, MD;  Location: [facility] ENDOSCOPY;  Service: Cardiovascular;  Laterality: N/A;     OB History   No obstetric history on file.     Family History  Problem Relation Age of Onset   Cancer Father    Diabetes Sister    Diabetes Brother     Social History   Tobacco Use   Smoking status: Light Smoker    Packs/day: 0.20    Years: 15.00    Pack years: 3.00    Types: Cigarettes   Smokeless tobacco: Never   Tobacco comments:     smoking 3-4 a day  Substance Use Topics   Alcohol use: No    Alcohol/week: 0.0 standard drinks   Drug use: No    Home Medications Prior to Admission medications   Medication Sig Start Date End Date Taking? Authorizing Provider  acetaminophen (TYLENOL) 500 MG tablet Take 2 tablets (1,000 mg total) by mouth every 6 (six) hours as needed. 11/30/15  Yes Nche, Bonna Gains, NP  albuterol (PROVENTIL HFA;VENTOLIN HFA) 108 (90 Base) MCG/ACT inhaler Inhale 2 puffs into the lungs every 6 (six) hours as needed for wheezing or shortness of breath.   Yes [provider]  atorvastatin (LIPITOR) 40 MG tablet TAKE 1 TABLET BY MOUTH DAILY AT 6 PM Patient taking differently: Take 40 mg by mouth daily. 03/04/20  Yes Olive Bass, FNP  clopidogrel (PLAVIX) 75 MG tablet TAKE 1 TABLET(75 MG) BY MOUTH DAILY Patient taking differently: Take 75 mg by mouth daily. 02/07/20  Yes Olive Bass, FNP  diclofenac Sodium (VOLTAREN) 1 % GEL Apply 2 g topically 4 (four) times daily. 05/12/19  Yes Olive Bass, FNP  DULoxetine (CYMBALTA) 60 MG capsule TAKE 1 CAPSULE(60 MG) BY MOUTH DAILY Patient taking differently: Take 60 mg by  mouth daily. 06/03/20  Yes Olive Bass, FNP  fluticasone [facility]) 50 MCG/ACT nasal spray Place 1 spray into both nostrils daily. Patient taking differently: Place 1 spray into both nostrils daily as needed for allergies. 10/23/17  Yes Shambley, Audie Box, NP  gabapentin (NEURONTIN) 300 MG capsule TAKE 1 CAPSULE(300 MG) BY MOUTH THREE TIMES DAILY Patient taking differently: Take 300 mg by mouth 3 (three) times daily. 05/18/19  Yes Olive Bass, FNP  losartan-hydrochlorothiazide [facility]) 100-25 MG tablet TAKE 1 TABLET BY MOUTH DAILY 06/03/20  Yes Olive Bass, FNP  metFORMIN (GLUCOPHAGE) 500 MG tablet TAKE 1 TABLET(500 MG) BY MOUTH DAILY WITH BREAKFAST Patient taking differently: Take 500 mg by mouth daily with breakfast. 06/03/20  Yes Olive Bass, FNP  Multiple Vitamins-Calcium (ONE-A-DAY WOMENS FORMULA PO) Take 1 tablet by mouth daily.   Yes [provider]  omeprazole (PRILOSEC) 20 MG capsule TAKE 1 CAPSULE(20 MG) BY MOUTH TWICE DAILY Patient taking differently: Take 20 mg by mouth 2 (two) times daily before a meal. 06/03/20  Yes Olive Bass, FNP  oxyCODONE-acetaminophen (PERCOCET) 10-325 MG tablet Take 1 tablet by mouth every 6 (six) hours as needed for pain. 04/09/19  Yes [provider]  potassium chloride (KLOR-CON) 10 MEQ tablet Take 1 tablet (10 mEq total) by mouth daily. 04/04/20  Yes Olive Bass, FNP  topiramate (TOPAMAX) 50 MG tablet Take 50 mg by mouth 2 (two) times daily as needed (migraines). 05/16/18  Yes [provider]  triamcinolone (KENALOG) 0.1 % Apply 1 application topically 2 (two) times daily. 04/01/20  Yes Olive Bass, FNP  zolpidem (AMBIEN) 5 MG tablet TAKE 1 TABLET(5 MG) BY MOUTH AT BEDTIME AS NEEDED FOR SLEEP 07/18/18  Yes Olive Bass, FNP  Glucose Blood (BLOOD GLUCOSE TEST STRIPS) STRP Inject 1 Units into the skin 4 (four) times daily -  before meals and at bedtime.  OneTouch Ultra mini strips Patient taking differently: Inject 1 Units into the skin 4 (four) times daily -  before meals and at bedtime. OneTouch Ultra mini strips 02/29/16   Nche, Bonna Gains, NP  ONETOUCH DELICA LANCETS FINE MISC 1 Units by Does not apply route 4 (four) times daily -  before meals and at bedtime. Patient taking differently: 1 Units by Does not apply route 4 (four) times daily -  before meals and at bedtime. 02/29/16   Nche, Bonna Gains, NP    Allergies    Aspirin, Diclofenac, and Penicillins  Review of Systems   Review of Systems  Constitutional:  Positive for appetite change and fatigue. Negative for chills and fever.  HENT:  Negative for ear pain and sore throat.   Eyes:  Negative for pain and visual disturbance.  Respiratory:  Negative for cough and shortness of breath.   Cardiovascular:  Negative for chest pain and palpitations.  Gastrointestinal:  Negative for abdominal pain and vomiting.  Genitourinary:  Negative for dysuria and hematuria.  Musculoskeletal:  Negative for arthralgias and back pain.  Skin:  Negative for color change and rash.  Neurological:  Positive for speech difficulty and weakness. Negative for seizures, syncope, facial asymmetry and numbness.  Psychiatric/Behavioral:  Positive for confusion.   All other systems reviewed and are negative.  Physical Exam Updated Vital Signs BP (!) 154/91   Pulse 72   Temp 99.1 F (37.3 C) (Oral)   Resp 15   SpO2 99%   Physical Exam Vitals and nursing note reviewed.  Constitutional:      General: She is not in acute distress.    Appearance: Normal appearance. She is well-developed.  HENT:     Head: Normocephalic and atraumatic.  Eyes:     Extraocular Movements: Extraocular movements intact.     Conjunctiva/sclera: Conjunctivae normal.     Pupils: Pupils are equal, round, and reactive to light.  Cardiovascular:     Rate and Rhythm: Normal rate and regular rhythm.     Heart sounds: No murmur  heard. Pulmonary:     Effort: Pulmonary effort is normal. No respiratory distress.     Breath sounds: Normal breath sounds.  Abdominal:     Palpations: Abdomen is soft.     Tenderness: There is no abdominal tenderness.  Musculoskeletal:        General: No swelling. Normal range of motion.     Cervical back: Neck supple.  Skin:    General: Skin is warm and dry.     Capillary Refill: Capillary refill takes 2 to 3 seconds.  Neurological:     General: No focal deficit present.     Mental Status: She is alert and oriented to person, place, and time.     Cranial Nerves: Cranial nerve deficit present.     Sensory: No sensory deficit.     Motor: Weakness present.     Comments: Definitely having difficulty formulating words.  Would not say that it is stuttering.  Seems to be having some trouble finding words.  Subtle weakness to right upper extremity lower extremity.    ED Results / Procedures / Treatments   Labs (all labs ordered are listed, but only abnormal results are displayed) Labs Reviewed  CBC - Abnormal; Notable for the following components:      Result Value   RBC 5.30 (*)    Hemoglobin 15.8 (*)    HCT 48.3 (*)    Platelets 407 (*)    All other components within normal limits  DIFFERENTIAL - Abnormal; Notable for the following components:   Neutro Abs 8.1 (*)    All other components within normal limits  COMPREHENSIVE METABOLIC PANEL - Abnormal; Notable for the following components:   Glucose, Bld 152 (*)    All other components within normal limits  RESP PANEL BY RT-PCR (FLU A&B, COVID) ARPGX2  ETHANOL  PROTIME-INR  APTT  RAPID URINE DRUG SCREEN, HOSP PERFORMED  URINALYSIS, ROUTINE W REFLEX MICROSCOPIC  I-STAT CHEM 8, ED    EKG EKG Interpretation  Date/Time:  Friday July 15 2020 13:13:21 EDT Ventricular Rate:  84 PR Interval:  141 QRS Duration: 83 QT Interval:  364 QTC Calculation: 431 R Axis:   73 Text Interpretation: Sinus rhythm Right atrial enlargement  RSR' in V1 or V2, probably normal variant No significant change since last tracing Confirmed by Vanetta Mulders [phone]) on 07/15/2020 3:13:20 PM  Radiology DG Chest 1 View  Result Date: 07/15/2020 CLINICAL DATA:  Altered mental status. EXAM: CHEST  1 VIEW COMPARISON:  Radiographs 10/23/2017. FINDINGS: 1350 hours. Lower lung volumes. The heart size and mediastinal contours are stable. The lungs appear clear. There is no pleural effusion or pneumothorax. Loop recorder overlies the left lower chest. There are mild degenerative changes at both shoulders with a calcification adjacent to the left greater tuberosity which could reflect hydroxyapatite deposition. No acute osseous findings. Telemetry leads overlie the chest. IMPRESSION: No active cardiopulmonary process. Possible left shoulder calcific tendinitis/bursitis. Electronically Signed   By: Carey Bullocks M.D.   On: 07/15/2020 14:18   DG Forearm Right  Result Date: 07/15/2020 CLINICAL DATA:  Altered mental status.  History of stroke. EXAM: RIGHT FOREARM - 2 VIEW; RIGHT HUMERUS - 2+ VIEW COMPARISON:  None. FINDINGS: No evidence of acute fracture or dislocation. There are mild degenerative changes at the shoulder and elbow. No significant elbow joint effusion. Ulnar minus variance noted at the wrist. There is an IV in the antecubital fossa. No focal soft tissue abnormality or foreign body identified. IMPRESSION: No acute findings identified within the right upper arm or forearm. Mild degenerative changes. Electronically Signed   By: Carey Bullocks M.D.   On: 07/15/2020 14:20   CT HEAD WO CONTRAST  Result Date: 07/15/2020 CLINICAL DATA:  Neuro deficit, acute stroke suspected. Mental  status change. Dysphagia for 2 days. EXAM: CT HEAD WITHOUT CONTRAST TECHNIQUE: Contiguous axial images were obtained from the base of the skull through the vertex without intravenous contrast. COMPARISON:  CT head 01/29/2018. FINDINGS: Brain: There is no evidence of acute  intracranial hemorrhage, mass lesion, brain edema or extra-axial fluid collection. Chronic encephalomalacia in the left parietal lobe appears stable, consistent with an old cortical and subcortical infarct. There is no CT evidence of acute cortical infarction. Mild progression of ventriculomegaly. Vascular: Mild intracranial vascular calcifications. No hyperdense vessel identified. Skull: Negative for fracture or focal lesion. Mild calvarial hyperostosis. Sinuses/Orbits: Minimal dependent mucosal thickening in the left division of the sphenoid sinus. The additional visualized paranasal sinuses are clear without air-fluid levels. The mastoid air cells and middle ears are clear. Other: None. IMPRESSION: 1. No definite acute intracranial findings. Stable old left parietal infarct. 2. Slight progression in ventricular dilatation without secondary signs of obstructive hydrocephalus. Electronically Signed   By: Carey Bullocks M.D.   On: 07/15/2020 14:16   DG Humerus Right  Result Date: 07/15/2020 CLINICAL DATA:  Altered mental status.  History of stroke. EXAM: RIGHT FOREARM - 2 VIEW; RIGHT HUMERUS - 2+ VIEW COMPARISON:  None. FINDINGS: No evidence of acute fracture or dislocation. There are mild degenerative changes at the shoulder and elbow. No significant elbow joint effusion. Ulnar minus variance noted at the wrist. There is an IV in the antecubital fossa. No focal soft tissue abnormality or foreign body identified. IMPRESSION: No acute findings identified within the right upper arm or forearm. Mild degenerative changes. Electronically Signed   By: Carey Bullocks M.D.   On: 07/15/2020 14:20    Procedures Procedures   CRITICAL CARE Performed by: Vanetta Mulders Total critical care time: 35 minutes Critical care time was exclusive of separately billable procedures and treating other patients. Critical care was necessary to treat or prevent imminent or life-threatening deterioration. Critical care was time  spent personally by me on the following activities: development of treatment plan with patient and/or surrogate as well as nursing, discussions with consultants, evaluation of patient's response to treatment, examination of patient, obtaining history from patient or surrogate, ordering and performing treatments and interventions, ordering and review of laboratory studies, ordering and review of radiographic studies, pulse oximetry and re-evaluation of patient's condition.   Medications Ordered in ED Medications  0.9 %  sodium chloride infusion ( Intravenous New Bag/Given 07/15/20 1446)  fentaNYL (SUBLIMAZE) injection 12.5 mcg (has no administration in time range)  sodium chloride 0.9 % bolus 500 mL (0 mLs Intravenous Stopped 07/15/20 1519)    ED Course  I have reviewed the triage vital signs and the nursing notes.  Pertinent labs & imaging results that were available during my care of the patient were reviewed by me and considered in my medical decision making (see chart for details).    MDM Rules/Calculators/A&P                          Initial work-up here stroke order set was used.  Patient not a candidate for code stroke order set.  Because she has had the symptoms for 2 days as per family.  The concern would be more of her speech problem and may be some subtle weakness in the right upper and lower extremity.  But CT does show evidence of an old left parietal infarct.  So these could be baseline.  But the speech is different normally she does have  stuttering.  The other concern could be for possible infection.  But work-up for that other than urinalysis which is still pending does not show any evidence of infection.  Her EKG had no acute changes.  Sugar slightly elevated 152 but renal function is normal.  X-rays of the right forearm were done because patient seem to have some pain with movement of her right upper extremity.  But forearm and humerus are negative.  There is some evidence of some  shoulder picked up on the chest x-ray.  This is on the left side.  Patient is being followed by orthopedics some consideration for bilateral carpal tunnel syndrome.  Also has MRI of the shoulder pending.  Since work-up other than urinalysis still pending.  Which could be source of infection.  Patient not febrile here.  No leukocytosis decided to proceed with MRI.  MRI tech wanted KUB to rule out any metal findings.  They also are concerned about the finding in the chest.  But notes clearly indicate that this is a loop recorder.  That should be fine for MRI  Patient turned over to Dr. Hyacinth Meeker who will follow-up on the urinalysis and the MRI results.  Overall patient starting to show some improvement.  If everything is negative patient is probably fine for discharge home. Final Clinical Impression(s) / ED Diagnoses Final diagnoses:  Altered mental status, unspecified altered mental status type    Rx / DC Orders ED Discharge Orders     None        Vanetta Mulders, MD 07/15/20 1639    Vanetta Mulders, MD 07/15/20 1640

## 2020-07-16 ENCOUNTER — Other Ambulatory Visit (HOSPITAL_COMMUNITY): Payer: Medicare Other

## 2020-07-16 ENCOUNTER — Inpatient Hospital Stay (HOSPITAL_COMMUNITY): Payer: Medicare Other

## 2020-07-16 DIAGNOSIS — Z880 Allergy status to penicillin: Secondary | ICD-10-CM | POA: Diagnosis not present

## 2020-07-16 DIAGNOSIS — R531 Weakness: Secondary | ICD-10-CM | POA: Diagnosis not present

## 2020-07-16 DIAGNOSIS — I10 Essential (primary) hypertension: Secondary | ICD-10-CM | POA: Diagnosis not present

## 2020-07-16 DIAGNOSIS — I639 Cerebral infarction, unspecified: Secondary | ICD-10-CM | POA: Diagnosis not present

## 2020-07-16 DIAGNOSIS — K219 Gastro-esophageal reflux disease without esophagitis: Secondary | ICD-10-CM | POA: Diagnosis not present

## 2020-07-16 DIAGNOSIS — I6389 Other cerebral infarction: Secondary | ICD-10-CM

## 2020-07-16 DIAGNOSIS — E785 Hyperlipidemia, unspecified: Secondary | ICD-10-CM | POA: Diagnosis not present

## 2020-07-16 DIAGNOSIS — Z7984 Long term (current) use of oral hypoglycemic drugs: Secondary | ICD-10-CM | POA: Diagnosis not present

## 2020-07-16 DIAGNOSIS — I69319 Unspecified symptoms and signs involving cognitive functions following cerebral infarction: Secondary | ICD-10-CM | POA: Diagnosis not present

## 2020-07-16 DIAGNOSIS — F32A Depression, unspecified: Secondary | ICD-10-CM | POA: Diagnosis not present

## 2020-07-16 DIAGNOSIS — I69354 Hemiplegia and hemiparesis following cerebral infarction affecting left non-dominant side: Secondary | ICD-10-CM | POA: Diagnosis not present

## 2020-07-16 DIAGNOSIS — E119 Type 2 diabetes mellitus without complications: Secondary | ICD-10-CM | POA: Diagnosis not present

## 2020-07-16 DIAGNOSIS — Z743 Need for continuous supervision: Secondary | ICD-10-CM | POA: Diagnosis not present

## 2020-07-16 DIAGNOSIS — F809 Developmental disorder of speech and language, unspecified: Secondary | ICD-10-CM | POA: Diagnosis present

## 2020-07-16 DIAGNOSIS — R279 Unspecified lack of coordination: Secondary | ICD-10-CM | POA: Diagnosis not present

## 2020-07-16 DIAGNOSIS — Z882 Allergy status to sulfonamides status: Secondary | ICD-10-CM | POA: Diagnosis not present

## 2020-07-16 DIAGNOSIS — Z888 Allergy status to other drugs, medicaments and biological substances status: Secondary | ICD-10-CM | POA: Diagnosis not present

## 2020-07-16 DIAGNOSIS — Z959 Presence of cardiac and vascular implant and graft, unspecified: Secondary | ICD-10-CM | POA: Diagnosis not present

## 2020-07-16 DIAGNOSIS — Z833 Family history of diabetes mellitus: Secondary | ICD-10-CM | POA: Diagnosis not present

## 2020-07-16 DIAGNOSIS — I69323 Fluency disorder following cerebral infarction: Secondary | ICD-10-CM | POA: Diagnosis not present

## 2020-07-16 DIAGNOSIS — M25562 Pain in left knee: Secondary | ICD-10-CM | POA: Diagnosis not present

## 2020-07-16 DIAGNOSIS — Z20822 Contact with and (suspected) exposure to covid-19: Secondary | ICD-10-CM | POA: Diagnosis present

## 2020-07-16 DIAGNOSIS — I480 Paroxysmal atrial fibrillation: Secondary | ICD-10-CM | POA: Diagnosis not present

## 2020-07-16 DIAGNOSIS — I63412 Cerebral infarction due to embolism of left middle cerebral artery: Secondary | ICD-10-CM | POA: Diagnosis present

## 2020-07-16 DIAGNOSIS — R4701 Aphasia: Secondary | ICD-10-CM | POA: Diagnosis present

## 2020-07-16 DIAGNOSIS — M6281 Muscle weakness (generalized): Secondary | ICD-10-CM | POA: Diagnosis not present

## 2020-07-16 DIAGNOSIS — F419 Anxiety disorder, unspecified: Secondary | ICD-10-CM | POA: Diagnosis present

## 2020-07-16 DIAGNOSIS — Z7401 Bed confinement status: Secondary | ICD-10-CM | POA: Diagnosis not present

## 2020-07-16 DIAGNOSIS — J45909 Unspecified asthma, uncomplicated: Secondary | ICD-10-CM | POA: Diagnosis not present

## 2020-07-16 DIAGNOSIS — M5030 Other cervical disc degeneration, unspecified cervical region: Secondary | ICD-10-CM | POA: Diagnosis not present

## 2020-07-16 DIAGNOSIS — I82722 Chronic embolism and thrombosis of deep veins of left upper extremity: Secondary | ICD-10-CM | POA: Diagnosis not present

## 2020-07-16 DIAGNOSIS — R519 Headache, unspecified: Secondary | ICD-10-CM | POA: Diagnosis present

## 2020-07-16 DIAGNOSIS — I69320 Aphasia following cerebral infarction: Secondary | ICD-10-CM | POA: Diagnosis not present

## 2020-07-16 DIAGNOSIS — Z7902 Long term (current) use of antithrombotics/antiplatelets: Secondary | ICD-10-CM | POA: Diagnosis not present

## 2020-07-16 DIAGNOSIS — G8929 Other chronic pain: Secondary | ICD-10-CM | POA: Diagnosis not present

## 2020-07-16 DIAGNOSIS — F17210 Nicotine dependence, cigarettes, uncomplicated: Secondary | ICD-10-CM | POA: Diagnosis present

## 2020-07-16 DIAGNOSIS — G459 Transient cerebral ischemic attack, unspecified: Secondary | ICD-10-CM | POA: Diagnosis not present

## 2020-07-16 DIAGNOSIS — Z79899 Other long term (current) drug therapy: Secondary | ICD-10-CM | POA: Diagnosis not present

## 2020-07-16 LAB — ECHOCARDIOGRAM COMPLETE BUBBLE STUDY
AR max vel: 1.85 cm2
AV Area VTI: 2.3 cm2
AV Area mean vel: 1.82 cm2
AV Mean grad: 4 mmHg
AV Peak grad: 7.2 mmHg
Ao pk vel: 1.34 m/s
Area-P 1/2: 3.79 cm2
S' Lateral: 2.2 cm

## 2020-07-16 LAB — LIPID PANEL
Cholesterol: 172 mg/dL (ref 0–200)
HDL: 48 mg/dL (ref >40)
LDL Cholesterol: 110 mg/dL — ABNORMAL HIGH (ref 0–99)
Total CHOL/HDL Ratio: 3.6 RATIO
Triglycerides: 71 mg/dL (ref <150)
VLDL: 14 mg/dL (ref 0–40)

## 2020-07-16 MED ORDER — ATORVASTATIN CALCIUM 40 MG PO TABS
40.0000 mg | ORAL_TABLET | Freq: Every day | ORAL | Status: DC
  Administered 2020-07-16 – 2020-07-18 (×3): 40 mg via ORAL
  Filled 2020-07-16 (×3): qty 1

## 2020-07-16 NOTE — Evaluation (Signed)
Occupational Therapy Evaluation Patient Details Name: Kara Mendoza MRN: [id number] DOB: Nov 14, 1950 Today's Date: 07/16/2020    History of Present Illness 70 y/o female presented to ED on 6/10 with complaints of "not acting right for 2 days", decreased activity, decreased ambulation and appetite. CT head (-) for acute findings. MRI shows scattered acute ischemic cortical infarct involving L frontotemporal region (L MCA stroke). PMH: hx of CVA with residual speech change, HTN, HLD, GERD, asthma, depression/anxiety, chronic pain.   Clinical Impression   PTA, pt was living in an apartment with 16 steps to enter. Pt lives with her son and had an PCA coming M-F  from 7am-5pm to assist pt with ADL/IADL and pt reports she was modified independent with functional mobility at spc level. Pt's daughter present during session and confirmed information provided. Daughter reports she does not feel her brother would be able to provide assistance pt requires at current functioning level.   Pt currently requires 1 person hand held assistance for in room mobility for toilet transfer/hygiene and grooming at sink level. Pt demonstrates expressive limitations. She demonstrates decreased functional use of RUE due to chronic pain in shoulder and wrist. Pt has plan for carpal tunnel release, per daughter.    Due to decline in current level of function, pt would benefit from acute OT to address established goals to facilitate safe D/C to venue listed below. At this time, recommend SNF follow-up. Daughter and pt agreeable. Will continue to follow acutely.     Follow Up Recommendations  SNF;Supervision/Assistance - 24 hour    Equipment Recommendations  None recommended by OT    Recommendations for Other Services       Precautions / Restrictions Precautions Precautions: Fall Precaution Comments: states potential surgery on L wrist but unable to verbalize when/why Restrictions Weight Bearing Restrictions: No       Mobility Bed Mobility Overal bed mobility: Needs Assistance Bed Mobility: Supine to Sit     Supine to sit: Min guard;HOB elevated     General bed mobility comments: pt sitting in recliner upon arrival    Transfers Overall transfer level: Needs assistance Equipment used: 1 person hand held assist Transfers: Sit to/from Stand Sit to Stand: Min guard         General transfer comment: min guard for safety. Increased time required    Balance Overall balance assessment: Mild deficits observed, not formally tested                                         ADL either performed or assessed with clinical judgement   ADL Overall ADL's : Needs assistance/impaired Eating/Feeding: Set up;Sitting   Grooming: Min guard;Standing   Upper Body Bathing: Set up;Sitting   Lower Body Bathing: Min guard;Sit to/from stand   Upper Body Dressing : Set up;Sitting   Lower Body Dressing: Minimal assistance;Sit to/from stand Lower Body Dressing Details (indicate cue type and reason): assist to access feet Toilet Transfer: Minimal assistance;Ambulation Toilet Transfer Details (indicate cue type and reason): 1 person hand held assistance to ambulate to bathroom Toileting- Clothing Manipulation and Hygiene: Minimal assistance;Sit to/from stand Toileting - Clothing Manipulation Details (indicate cue type and reason): minA to progress into standing from standard commode     Functional mobility during ADLs: Minimal assistance General ADL Comments: minA 1 person hand held assistance     Vision Baseline Vision/History: Wears glasses Patient Visual  Report: No change from baseline Vision Assessment?: No apparent visual deficits Additional Comments: vision assessment completed, no limitations noted at this time     Perception     Praxis      Pertinent Vitals/Pain Pain Assessment: Faces Faces Pain Scale: Hurts even more Pain Location: L wrist/hand (plan for carpal tunnel  sx) Pain Descriptors / Indicators: Grimacing;Guarding Pain Intervention(s): Limited activity within patient's tolerance;Monitored during session     Hand Dominance Right   Extremity/Trunk Assessment Upper Extremity Assessment Upper Extremity Assessment: RUE deficits/detail RUE Deficits / Details: sensation intact, pt reports pain in wrist has plan for carpal tunnel sx, pt reports pain in R shoulder, minimal ROM secondary to pain RUE: Unable to fully assess due to pain   Lower Extremity Assessment Lower Extremity Assessment: Defer to PT evaluation   Cervical / Trunk Assessment Cervical / Trunk Assessment: Normal   Communication Communication Communication: Expressive difficulties   Cognition Arousal/Alertness: Awake/alert Behavior During Therapy: WFL for tasks assessed/performed Overall Cognitive Status: Difficult to assess                                 General Comments: A&Ox4 but needs increased time to respond and at times needs choices due to expressive difficulties. Following all simple and multi-step commands   General Comments  vss on RA;daughter present during session    Exercises     Shoulder Instructions      Home Living Family/patient expects to be discharged to:: Private residence Living Arrangements: Children Available Help at Discharge: Family;Available 24 hours/day Type of Home: Apartment (second floor) Home Access: Stairs to enter Entrance Stairs-Number of Steps: 17 Entrance Stairs-Rails: Right;Left;Can reach both Home Layout: Other (Comment) (2nd floor apartment)     Bathroom Shower/Tub: Chief Strategy Officer: Standard         Additional Comments: daughter confirmed information provided by pt;daughter reports her brother does not provide much assistance for pt and does not feel he would provide appropriate level of assistance for pt's current needs      Prior Functioning/Environment Level of Independence: Needs  assistance  Gait / Transfers Assistance Needed: uses cane to ambulate and one person assist ADL's / Homemaking Assistance Needed: PCA assists with bathing and dressing. PCA M-F from 7am-5pm   Comments: pt's daughter confrimed information provided by pt        OT Problem List: Decreased strength;Decreased range of motion;Decreased activity tolerance;Impaired balance (sitting and/or standing);Decreased cognition;Decreased knowledge of use of DME or AE;Decreased safety awareness;Decreased knowledge of precautions;Pain      OT Treatment/Interventions: Energy conservation;Therapeutic exercise;Self-care/ADL training;Cognitive remediation/compensation;Patient/family education;Balance training    OT Goals(Current goals can be found in the care plan section) Acute Rehab OT Goals Patient Stated Goal: to eventually return home OT Goal Formulation: With patient Time For Goal Achievement: 07/30/20 Potential to Achieve Goals: Good ADL Goals Pt Will Perform Grooming: with modified independence;standing Pt Will Perform Lower Body Dressing: with set-up;sit to/from stand Pt Will Transfer to Toilet: with supervision;ambulating  OT Frequency: Min 2X/week   Barriers to D/C:            Co-evaluation              AM-PAC OT "6 Clicks" Daily Activity     Outcome Measure Help from another person eating meals?: A Little Help from another person taking care of personal grooming?: A Little Help from another person toileting, which includes using toliet,  bedpan, or urinal?: A Little Help from another person bathing (including washing, rinsing, drying)?: A Little Help from another person to put on and taking off regular upper body clothing?: A Little Help from another person to put on and taking off regular lower body clothing?: A Little 6 Click Score: 18   End of Session Equipment Utilized During Treatment: Gait belt;Rolling walker Nurse Communication: Mobility status  Activity Tolerance: Patient  tolerated treatment well Patient left: in chair;with chair alarm set;with call bell/phone within reach;with family/visitor present  OT Visit Diagnosis: Other abnormalities of gait and mobility (R26.89);Muscle weakness (generalized) (M62.81);Other symptoms and signs involving cognitive function;Cognitive communication deficit (R41.841) Symptoms and signs involving cognitive functions: Cerebral infarction                Time: 4081-4481 OT Time Calculation (min): 33 min Charges:  OT General Charges $OT Visit: 1 Visit OT Evaluation $OT Eval Moderate Complexity: 1 Mod OT Treatments $Self Care/Home Management : 8-22 mins  Rosey Bath OTR/L Acute Rehabilitation Services Office: [phone]   Rebeca Alert 07/16/2020, 11:59 AM

## 2020-07-16 NOTE — Evaluation (Signed)
Speech Language Pathology Evaluation Patient Details Name: MARYA LOWDEN MRN: [id number] DOB: [birth year] Today's Date: 07/16/2020 Time: 0737-1062 SLP Time Calculation (min) (ACUTE ONLY): 35 min  Problem List:  Patient Active Problem List   Diagnosis Date Noted   Acute CVA (cerebrovascular accident) (HCC) 07/15/2020   Dysuria 05/29/2018   Residual cognitive deficit as late effect of stroke 04/26/2016   Hemiparesis affecting left side as late effect of cerebrovascular accident (CVA) (HCC) 04/26/2016   Carpal tunnel syndrome on left 11/29/2015   DDD (degenerative disc disease), cervical 11/29/2015   Depression 11/29/2015   Chronic pain of left knee 09/04/2015   Headache 09/04/2015   Paroxysmal atrial fibrillation (HCC) 07/22/2015   Complex regional pain syndrome type 2 of upper extremity 11/05/2013   Carpal tunnel syndrome on right 10/22/2013   Chronic embolism and thrombosis of deep vein of left upper extremity (HCC) 10/22/2013   Cardiac device in situ 10/14/2013   Essential hypertension 09/30/2013   Complex regional pain syndrome type 1 of both upper extremities 09/30/2013   Diabetes mellitus (HCC) 09/28/2013   Hyperthyroidism 09/28/2013   Median nerve neuritis 09/28/2013   Ataxia, late effect of cerebrovascular disease 09/28/2013   Spastic hemiplegia affecting dominant side (HCC) 09/28/2013   CVA (cerebral vascular accident) (HCC) 08/15/2013   Cerebral embolism with cerebral infarction (HCC) 08/09/2013   Dysarthria 08/09/2013   Abnormality of gait 05/01/2012   Incontinence of urine 05/01/2012   Tobacco abuse 01/28/2012   Tobacco dependence syndrome 01/28/2012   Plantar fasciitis 09/01/2010   Allergic rhinitis 07/11/2010   Persistent cough 06/16/2010   Asthma night-time symptoms 06/14/2010   GERD (gastroesophageal reflux disease) 05/26/2010   Stroke (HCC) 05/26/2010   Hyperlipidemia 05/26/2010   Osteoarthritis 05/26/2010   Mononeuritis 05/26/2010   Past Medical History:   Past Medical History:  Diagnosis Date   Abnormality of gait    Acute bronchitis    Allergy    Anxiety    Anxiety state, unspecified    Asthma    Chronic arm pain 2008   left   Chronic headache    Chronic leg pain 2008   left   Chronic pain syndrome    Constipation    Diabetes mellitus (HCC)    Dysarthria    Esophageal reflux    Fatigue    History of fall    Hyperlipidemia    Hypertension    Hyperthyroidism    LOOP Recorder 10/14/2013   Major depressive disorder, recurrent episode, moderate (HCC)    Memory loss    Neuromuscular disorder (HCC)    Osteoarthritis    Stroke (HCC)    Tobacco use disorder    Past Surgical History:  Past Surgical History:  Procedure Laterality Date   CESAREAN SECTION     x3   FINGER SURGERY     LOOP RECORDER IMPLANT  08-12-2013   MDT LINQ implanted by Dr Graciela Husbands for cryptogenic stroke   LOOP RECORDER IMPLANT N/A 08/12/2013   Procedure: LOOP RECORDER IMPLANT;  Surgeon: Duke Salvia, MD;  Location: [facility] CATH LAB;  Service: Cardiovascular;  Laterality: N/A;   TEE WITHOUT CARDIOVERSION N/A 08/12/2013   Procedure: TRANSESOPHAGEAL ECHOCARDIOGRAM (TEE);  Surgeon: Wendall Stade, MD;  Location: [facility] ENDOSCOPY;  Service: Cardiovascular;  Laterality: N/A;   HPI:  NOELANI HARBACH is a 70 y.o. female with medical history significant for history of stroke with residual stuttering, hypertension, hyperlipidemia, GERD, asthma, depression/anxiety, and chronic pain who presented to the ED for evaluation of change in activity  and speech. Head MRI reveals 1. Scattered acute ischemic cortical infarct involving the left  frontotemporal region. Associated mild petechial hemorrhage without  frank hemorrhagic transformation or significant mass effect.  2. Underlying chronic left MCA distribution infarct with moderate  chronic microvascular ischemic disease.   Assessment / Plan / Recommendation Clinical Impression  Ms. Pociask demonstrates a mild receptive and moderate-severe  expressive aphasia. Pt demonstrates good insight into her deficits and is obviously frustrated by them. She had strengths in yes/no accuracy and object recognition. She appeared to have fair comprehension of questions in conversation, but with following directions was noted to perseverate physically by pointing to the last item. She had weaknesses in confrontation naming, automatic speech, repetition, writing, and verbal fluency. With reading instructions, pt read the words aloud correctly, but had poor comprehension and was unable to complete the directions. With writing, pt had phonemic paraphasias (possibly due to receptive deficits/not comprehending the word requested). She wrote "set" for "sit" and "plate" for "airplane." She shook her head, indicating she knew these were incorrect. See scores below.  [place] Aphasia Screening Test (MAST): Naming: 4/10 (perseveration) Automatic Speech: 5/10 Repetition: 6/10 Writing: 0/10 Verbal Fluency: 5/10 Yes/No Accuracy: 14/20 Object Recognition: 10/10 Following Instructions: 6/10 (perseveration) Reading Instructions: 0/10 (read accurately aloud, but could not complete instruction)  Expressive Index: 20/50 Receptive Index: 30/50 Total: 50/100; consistent with a moderate mixed aphasia  Recommend: speech/lang therapy during hospitalization, inpatient rehab for intensive therapy, communicate via yes/no questions, repeat things back to patient for her to confirm or deny; cue her to cease perseveration and shift attention    SLP Assessment  SLP Recommendation/Assessment: Patient needs continued Speech Lanaguage Pathology Services SLP Visit Diagnosis: Aphasia (R47.01)    Follow Up Recommendations  Inpatient Rehab    Frequency and Duration min 2x/week  2 weeks      SLP Evaluation Cognition  Overall Cognitive Status: Difficult to assess Arousal/Alertness: Awake/alert Attention: Focused Focused Attention: Appears intact Awareness: Appears  intact Executive Function: Self Monitoring Self Monitoring: Appears intact Behaviors: Perseveration       Comprehension  Auditory Comprehension Overall Auditory Comprehension: Impaired Yes/No Questions: Impaired Complex Questions: 0-24% accurate Commands: Impaired Complex Commands: 0-24% accurate Conversation: Complex EffectiveTechniques: Repetition;Slowed speech;Stressing words;Visual/Gestural cues Visual Recognition/Discrimination Discrimination: Within Function Limits Reading Comprehension Reading Status: Impaired Sentence Level: Impaired    Expression Expression Primary Mode of Expression: Verbal Verbal Expression Overall Verbal Expression: Impaired Initiation: No impairment Automatic Speech: Name;Social Response;Counting;Day of week;Month of year Level of Generative/Spontaneous Verbalization: Phrase Repetition: Impaired Level of Impairment: Word level Naming: Impairment Responsive: 0-25% accurate Confrontation: Impaired Divergent: 0-24% accurate Verbal Errors: Perseveration;Neologisms Pragmatics: No impairment Effective Techniques: Phonemic cues Non-Verbal Means of Communication: Gestures Written Expression Dominant Hand: Right Written Expression: Exceptions to [facility]   Oral / Motor  Oral Motor/Sensory Function Overall Oral Motor/Sensory Function: Within functional limits Motor Speech Overall Motor Speech: Appears within functional limits for tasks assessed Respiration: Within functional limits Phonation: Normal Resonance: Within functional limits Articulation: Within functional limitis Intelligibility: Intelligible Motor Planning: Impaired Motor Speech Errors: Aware            Izyk Marty P. Graclynn Vanantwerp, M.S., CCC-SLP Speech-Language Pathologist Acute Rehabilitation Services Pager: [phone]       Susanne Borders Tarynn Garling 07/16/2020, 3:52 PM

## 2020-07-16 NOTE — Progress Notes (Signed)
STROKE TEAM PROGRESS NOTE   INTERVAL HISTORY  70 y.o. female with PMH significant for anxiety, asthma, chronic headaches, diabetes, hyperlipidemia, hypertension, prior left MCA stroke with residual language impairment and known Afib (noted on loop recorder in 2017) who presents with 2 day history of worsening speech and decreased ambulation. MRI Brain with scattered acute L MCA stroke. MRA with severe LMCA distal M1 stenosis.  Vitals:   07/16/20 0345 07/16/20 0720 07/16/20 1223 07/16/20 1534  BP: 140/60 (!) 141/72 (!) 142/67 (!) 149/64  Pulse: 70 81 81 75  Resp: 16 18 16 20   Temp: 98.9 F (37.2 C) 98.5 F (36.9 C) 98.2 F (36.8 C) 98.2 F (36.8 C)  TempSrc: Oral Oral Oral Oral  SpO2: 100% 98% 100% 100%   CBC:  Recent Labs  Lab 07/15/20 1321  WBC 10.2  NEUTROABS 8.1*  HGB 15.8*  HCT 48.3*  MCV 91.1  PLT 407*   Basic Metabolic Panel:  Recent Labs  Lab 07/15/20 1321  NA 138  K 4.1  CL 102  CO2 26  GLUCOSE 152*  BUN 8  CREATININE 0.76  CALCIUM 10.3   Lipid Panel:  Recent Labs  Lab 07/16/20 0133  CHOL 172  TRIG 71  HDL 48  CHOLHDL 3.6  VLDL 14  LDLCALC 09/15/20*   HgbA1c: No results for input(s): HGBA1C in the last 168 hours. Urine Drug Screen:  Recent Labs  Lab 07/15/20 2112  LABOPIA NONE DETECTED  COCAINSCRNUR NONE DETECTED  LABBENZ NONE DETECTED  AMPHETMU NONE DETECTED  THCU NONE DETECTED  LABBARB NONE DETECTED    Alcohol Level  Recent Labs  Lab 07/15/20 1321  ETH <10    IMAGING past 24 hours DG Chest 1 View  Result Date: 07/15/2020 MPRESSION: No active cardiopulmonary process. Possible left shoulder calcific tendinitis/bursitis.   DG Forearm Right  Result Date: 07/15/2020 IMPRESSION: No acute findings identified within the right upper arm or forearm. Mild degenerative changes.   DG Abdomen 1 View  Result Date: 07/15/2020 MPRESSION: No retained metallic foreign body identified. Loop recorder in the left chest wall is noted.   CT HEAD WO  CONTRAST Result Date: 07/15/2020 IMPRESSION:  1. No definite acute intracranial findings. Stable old left parietal infarct. 2. Slight progression in ventricular dilatation without secondary signs of obstructive hydrocephalus.  MR ANGIO Neck WO CONTRAST Result Date: 07/15/2020 IMPRESSION:  1. Technically limited exam by motion and lack of IV contrast.  2. Patency of both carotid artery systems within the neck without appreciable stenosis or other acute vascular abnormality.  3. Strongly dominant right vertebral artery patent within the neck. Hypoplastic left vertebral artery not well seen, and may be occluded/partially occluded.    Result Date: 07/15/2020 MRA HEAD IMPRESSION:  1. Negative MRA for large vessel occlusion.  2. Severe stenoses involving the distal left M1 segment, left MCA bifurcation, and adjacent proximal M2 branches, with probable involvement of both superior and inferior divisions.  3. Moderate stenosis at the vertebrobasilar junction. 4. Nonvisualization of the left vertebral artery.   MR Brain Wo Contrast (neuro protocol) Result Date: 07/15/2020 IMPRESSION: 1. Scattered acute ischemic cortical infarct involving the left frontotemporal region. Associated mild petechial hemorrhage without frank hemorrhagic transformation or significant mass effect.  2. Underlying chronic left MCA distribution infarct with moderate chronic microvascular ischemic disease.   DG Humerus Right  Result Date: 07/15/2020 IMPRESSION: No acute findings identified within the right upper arm or forearm. Mild degenerative changes.   PHYSICAL EXAM General: Laying comfortably in bed;  in no acute distress. HENT: Normal oropharynx and mucosa. Normal external appearance of ears and nose. Neck: Supple, no pain or tenderness CV: No JVD. No peripheral edema. Pulmonary: Symmetric Chest rise. Normal respiratory effort. Abdomen: Soft to touch, non-tender. Ext: No cyanosis, edema, or deformity  Skin: No rash.  Normal palpation of skin.     Neurologic Examination  Mental status/Cognition: Alert, oriented to self, limited by aphasia. Speech/language: dysarthric, impaired naming of objects,unable to repeat. Cranial nerves:   CN II Pupils equal and reactive to light, no VF deficits   CN III,IV,VI EOM intact, no gaze preference or deviation, no nystagmus   CN V normal sensation in V1, V2, and V3 segments bilaterally   CN VII no asymmetry, no nasolabial fold flattening   CN VIII normal hearing to speech   CN IX & X normal palatal elevation, no uvular deviation   CN XI 5/5 head turn and 5/5 shoulder shrug bilaterally   CN XII midline tongue protrusion    Motor:  Muscle bulk: normal, tone normal, pronator drift RUE pronator drift Coordination/Complex Motor: - Finger to Nose intact BL - Heel to shin unable to comprehend how to do it. - Rapid alternating movement are normal - Gait: deferred  ASSESSMENT/PLAN Kara Mendoza is a 70 y.o. female with history of anxiety, asthma, chronic headaches, diabetes, hyperlipidemia, hypertension, prior left MCA stroke with residual language impairment and known pAfib (noted on loop recorder in 2017) who presents with 2 day history of worsening speech and decreased ambulation.  MRI showed scattered acute L MCA stroke. MRA with severe LMCA distal M1 stenosis.    Stroke:  left scattered MCA stroke with severe left MCA distal M1 stenosis.  Code Stroke CT head no acute abnormality. Stable old left parietal infarct. MRI: Scattered acute ischemic cortical infarct involving the left frontotemporal region associated mild petechial hemorrhage MRA head/neck No LVO. Severe stenoses involving the distal left M1 segment, left MCA bifurcation, and adjacent proximal M2 branches 2D Echo EF 60-65%, grade I diastolic dysfunction, no evidence of interatrial shunt Loop recorder in place since 10/14/2013. Obtain results if possible LDL 110 HgbA1c 6.4 VTE prophylaxis - Lovenox  sq Diet: heart healthy Clopidogrel prior to admission, continue clopidogrel 75 mg daily. Patient has an allergy to ASA. Therapy recommendations:  SNF Disposition:  pending  Prior Stroke History left MCA distribution infarct in 2008 with residual language impairment, hypertension, dyslipidemia, tobacco abuse, found to have again left parietal and frontal small embolic pattern strokes.  Did not receive tPA due to unclear last known well. Chronic left parietal infarct with new extension on 08/10/2013 work up negative and hypercoag labs were unremarkable. Likely cryptogenic stroke. Loop recorder negative for AF episodes. TCD emboli monitoring negative for MES. Placed on plavix and statin.   Essential ypertension Home meds:  hyzaar 100/25mg  daily Stable Permissive hypertension (OK if < 220/120) but gradually normalize in 5-7 days Long-term BP goal normotensive  Hyperlipidemia Home meds: none LDL 110, goal < 70 Add atorvastatin 40mg  daily  Continue statin at discharge  Diabetes type II controlled Home meds:  glucophage 500mg  daily HgbA1c 6.4, goal < 7.0 CBGs No results for input(s): GLUCAP in the last 72 hours.  SSI  Other Stroke Risk Factors Advanced Age >/= 51  Cigarette smoker. 3 pack year. advised to stop smoking Overweight, recommend weight loss, diet and exercise as appropriate  Hx stroke: left parietal stroke with residual language impairment and known proximal atrial fibrillation  Other Active Problems Anxiety/Major  depressive disorder: cymbalta Asthma [facility] day # 0  Lissy Olivencia-Simmons, ACNP-BC Stroke NP  To contact Stroke Continuity provider, please refer to [email or web address]. After hours, contact General Neurology

## 2020-07-16 NOTE — Evaluation (Addendum)
Physical Therapy Evaluation Patient Details Name: Kara Mendoza MRN: [id number] DOB: 03-31-1950 Today's Date: 07/16/2020   History of Present Illness  70 y/o female presented to ED on 6/10 with complaints of "not acting right for 2 days", decreased activity, decreased ambulation and appetite. CT head (-) for acute findings. MRI shows scattered acute ischemic cortical infarct involving L frontotemporal region (L MCA stroke). PMH: hx of CVA with residual speech change, HTN, HLD, GERD, asthma, depression/anxiety, chronic pain.  Clinical Impression  Difficult to obtain full home setup due to expressive difficulties. Per patient, she lives with children and reports needing assistance of one person for ambulation with cane and ADLs. PCA comes to assist with bathing/dressing but unable to state frequency. Patient currently functioning at min guard-minA level for mobility with HHAx1. Patient presents with generalized weakness, decreased activity tolerance, impaired balance, aphasia. Patient will benefit from skilled PT services during acute stay to address listed deficits. Recommend HHPT at discharge if family is able to provide 24 hour supervision, patient may need short term rehab prior to returning home.     Follow Up Recommendations SNF; Supervision/Assistance - 24 hour    Equipment Recommendations  None recommended by PT    Recommendations for Other Services       Precautions / Restrictions Precautions Precautions: Fall Precaution Comments: states potential surgery on L wrist but unable to verbalize when/why Restrictions Weight Bearing Restrictions: No      Mobility  Bed Mobility Overal bed mobility: Needs Assistance Bed Mobility: Supine to Sit     Supine to sit: Min guard;HOB elevated     General bed mobility comments: min guard for safety. HOB slightly elevated    Transfers Overall transfer level: Needs assistance Equipment used: 1 person hand held assist Transfers: Sit  to/from Stand Sit to Stand: Min guard         General transfer comment: min guard for safety. Increased time required  Ambulation/Gait Ambulation/Gait assistance: Min guard;Min assist Gait Distance (Feet): 10 Feet (x15') Assistive device: 1 person hand held assist Gait Pattern/deviations: Step-through pattern;Decreased stride length;Decreased stance time - right;Decreased weight shift to right;Wide base of support;Drifts right/left Gait velocity: decreased   General Gait Details: minA to recover for mild LOB. Short step through pattern with drifiting L/R. Reports dizziness during first bout and required seated rest break. Second bout with no reports of dizziness.  Stairs            Wheelchair Mobility    Modified Rankin (Stroke Patients Only) Modified Rankin (Stroke Patients Only) Pre-Morbid Rankin Score: Moderately severe disability Modified Rankin: Moderately severe disability     Balance Overall balance assessment: Mild deficits observed, not formally tested                                           Pertinent Vitals/Pain Pain Assessment: Faces Faces Pain Scale: Hurts little more Pain Location: L wrist/hand Pain Descriptors / Indicators: Grimacing;Guarding Pain Intervention(s): Monitored during session;Repositioned    Home Living Family/patient expects to be discharged to:: Private residence Living Arrangements: Children Available Help at Discharge: Family;Available 24 hours/day Type of Home: Apartment (second floor) Home Access: Stairs to enter Entrance Stairs-Rails: Right;Left;Can reach both Entrance Stairs-Number of Steps: 17 Home Layout: Other (Comment) (2nd floor apartment)   Additional Comments: Difficult to obtain some home information due to impaired communication    Prior Function Level of Independence:  Needs assistance   Gait / Transfers Assistance Needed: uses cane to ambulate and one person assist  ADL's / Homemaking  Assistance Needed: PCA assists with bathing and dressing. Unable to state how often PCA comes  Comments: Difficult to obtain due to impaired communication     Hand Dominance        Extremity/Trunk Assessment   Upper Extremity Assessment Upper Extremity Assessment: Defer to OT evaluation    Lower Extremity Assessment Lower Extremity Assessment: Generalized weakness    Cervical / Trunk Assessment Cervical / Trunk Assessment: Normal  Communication   Communication: Expressive difficulties  Cognition Arousal/Alertness: Awake/alert Behavior During Therapy: WFL for tasks assessed/performed Overall Cognitive Status: Difficult to assess                                 General Comments: A&Ox4 but needs increased time to respond and at times needs choices due to expressive difficulties. Following all simple and multi-step commands      General Comments      Exercises     Assessment/Plan    PT Assessment Patient needs continued PT services  PT Problem List Decreased strength;Decreased activity tolerance;Decreased balance;Decreased mobility       PT Treatment Interventions DME instruction;Gait training;Stair training;Functional mobility training;Therapeutic activities;Therapeutic exercise;Balance training;Neuromuscular re-education;Patient/family education    PT Goals (Current goals can be found in the Care Plan section)  Acute Rehab PT Goals Patient Stated Goal: to go home PT Goal Formulation: With patient Time For Goal Achievement: 07/30/20 Potential to Achieve Goals: Fair    Frequency Min 4X/week   Barriers to discharge        Co-evaluation               AM-PAC PT "6 Clicks" Mobility  Outcome Measure Help needed turning from your back to your side while in a flat bed without using bedrails?: A Little Help needed moving from lying on your back to sitting on the side of a flat bed without using bedrails?: A Little Help needed moving to and from  a bed to a chair (including a wheelchair)?: A Little Help needed standing up from a chair using your arms (e.g., wheelchair or bedside chair)?: A Little Help needed to walk in hospital room?: A Little Help needed climbing 3-5 steps with a railing? : A Little 6 Click Score: 18    End of Session Equipment Utilized During Treatment: Gait belt Activity Tolerance: Patient tolerated treatment well Patient left: in chair;with call bell/phone within reach;with chair alarm set Nurse Communication: Mobility status PT Visit Diagnosis: Unsteadiness on feet (R26.81);Muscle weakness (generalized) (M62.81);Other abnormalities of gait and mobility (R26.89);Difficulty in walking, not elsewhere classified (R26.2)    Time: 1448-1856 PT Time Calculation (min) (ACUTE ONLY): 30 min   Charges:   PT Evaluation $PT Eval Moderate Complexity: 1 Mod          Dashea Mcmullan A. Dan Humphreys PT, DPT Acute Rehabilitation Services Pager [phone] Office [phone]   Viviann Spare 07/16/2020, 10:45 AM

## 2020-07-16 NOTE — Progress Notes (Signed)
PROGRESS NOTE  Kara Mendoza ERX:[id number] DOB: Apr 06, 1950 DOA: 07/15/2020 PCP: Olive Bass, FNP   LOS: 0 days   Brief narrative:  Kara Mendoza is a 70 y.o. female with medical history significant for history of stroke with residual speech stuttering change, hypertension, hyperlipidemia, GERD, asthma, depression/anxiety, and chronic pain who presented with change in her physical activity and behavior for 2 days with decreased appetite and ambulation.   Patient does have residual residual stuttering speech from a prior stroke but now her speech is significantly changed from her baseline.  She has appeared to have difficulty formulating words.  In the ED, CT head without contrast shows stable old left parietal infarct without acute intracranial finding.  Slight progression in ventricular dilatation without secondary signs of obstructive hydrocephalus noted.  Right humerus and forearm x-rays negative for acute findings.  Chest x-ray showed a loop recorder with no acute findings.  Patient was then admitted to the hospital for further evaluation and treatment.  Assessment/Plan:  Principal Problem:   Acute CVA (cerebrovascular accident) [facility]) Active Problems:   Hyperlipidemia   Essential hypertension  Acute CVA with history of left parietal stroke: On Plavix as outpatient.  MRI at this time showed no ischemic infarct of the left frontotemporal areas.  MRA showed M1 segment stenosis.  Neurology on board.  Check 2D echocardiogram.  Continue Plavix 75 daily.  Continue PT OT speech therapy continue Lipitor.  Urine drug screen was negative.  UA was negative for any infection.  Physical therapy has recommended skilled nursing facility placement at this time.   Essential hypertension. We will continue to hold antihypertensives for now for permissive hypertension latest blood pressure 142/67.   Hyperlipidemia: Continue atorvastatin.   Prediabetes: Holding home metformin.  Pending  Hemoglobin A1c   Asthma: Currently stable.  Continue albuterol as needed.   Depression/anxiety: Continue Cymbalta.   Chronic pain: On Percocet and gabapentin at home.   DVT prophylaxis: enoxaparin (LOVENOX) injection 40 mg Start: 07/15/20 2000  Code Status: Full code  Family Communication: Spoke with the patient's family at bedside.  Status is: Observation  The patient will require care spanning > 2 midnights and should be moved to inpatient because: Ongoing diagnostic testing needed not appropriate for outpatient work up, IV treatments appropriate due to intensity of illness or inability to take PO, and Inpatient level of care appropriate due to severity of illness  Dispo: The patient is from: Home              Anticipated d/c is to: Skilled nursing facility placement.              Patient currently is not medically stable to d/c.   Difficult to place patient No  Consultants: Neurology  Procedures: None   Anti-infectives:  None  Anti-infectives (From admission, onward)    None       Subjective: Today, patient was seen and examined at bedside.  Patient has baseline stuttering.  Denies any headache, dizziness, lightheadedness.  residual weakness on the left.  Objective: Vitals:   07/16/20 0720 07/16/20 1223  BP: (!) 141/72 (!) 142/67  Pulse: 81 81  Resp: 18 16  Temp: 98.5 F (36.9 C) 98.2 F (36.8 C)  SpO2: 98% 100%    Intake/Output Summary (Last 24 hours) at 07/16/2020 1304 Last data filed at 07/16/2020 0718 Gross per 24 hour  Intake 500.84 ml  Output 150 ml  Net 350.84 ml   There were no vitals filed for this visit.  There is no height or weight on file to calculate BMI.   Physical Exam:  GENERAL: Patient is alert awake and communicative, stuttering, not in obvious distress. HENT: No scleral pallor or icterus. Pupils equally reactive to light. Oral mucosa is moist NECK: is supple, no gross swelling noted. CHEST: Clear to auscultation. No crackles  or wheezes.  Diminished breath sounds bilaterally. CVS: S1 and S2 heard, no murmur. Regular rate and rhythm.  ABDOMEN: Soft, non-tender, bowel sounds are present. EXTREMITIES: No edema. CNS: Cranial nerves are intact.  Left-sided residual weakness, stuttering, expressive aphasia, repeats few words. SKIN: warm and dry without rashes.  Data Review: I have personally reviewed the following laboratory data and studies,  CBC: Recent Labs  Lab 07/15/20 1321  WBC 10.2  NEUTROABS 8.1*  HGB 15.8*  HCT 48.3*  MCV 91.1  PLT 407*   Basic Metabolic Panel: Recent Labs  Lab 07/15/20 1321  NA 138  K 4.1  CL 102  CO2 26  GLUCOSE 152*  BUN 8  CREATININE 0.76  CALCIUM 10.3   Liver Function Tests: Recent Labs  Lab 07/15/20 1321  AST 25  ALT 31  ALKPHOS 71  BILITOT 1.0  PROT 6.8  ALBUMIN 3.9   No results for input(s): LIPASE, AMYLASE in the last 168 hours. No results for input(s): AMMONIA in the last 168 hours. Cardiac Enzymes: No results for input(s): CKTOTAL, CKMB, CKMBINDEX, TROPONINI in the last 168 hours. BNP (last 3 results) No results for input(s): BNP in the last 8760 hours.  ProBNP (last 3 results) No results for input(s): PROBNP in the last 8760 hours.  CBG: No results for input(s): GLUCAP in the last 168 hours. Recent Results (from the past 240 hour(s))  Resp Panel by RT-PCR (Flu A&B, Covid) Nasopharyngeal Swab     Status: None   Collection Time: 07/15/20  1:21 PM   Specimen: Nasopharyngeal Swab; Nasopharyngeal(NP) swabs in vial transport medium  Result Value Ref Range Status   SARS Coronavirus 2 by RT PCR NEGATIVE NEGATIVE Final    Comment: (NOTE) SARS-CoV-2 target nucleic acids are NOT DETECTED.  The SARS-CoV-2 RNA is generally detectable in upper respiratory specimens during the acute phase of infection. The lowest concentration of SARS-CoV-2 viral copies this assay can detect is 138 copies/mL. A negative result does not preclude SARS-Cov-2 infection and  should not be used as the sole basis for treatment or other patient management decisions. A negative result may occur with  improper specimen collection/handling, submission of specimen other than nasopharyngeal swab, presence of viral mutation(s) within the areas targeted by this assay, and inadequate number of viral copies(<138 copies/mL). A negative result must be combined with clinical observations, patient history, and epidemiological information. The expected result is Negative.  Fact Sheet for Patients:  BloggerCourse.com  Fact Sheet for Healthcare Providers:  SeriousBroker.it  This test is no t yet approved or cleared by the [place] FDA and  has been authorized for detection and/or diagnosis of SARS-CoV-2 by FDA under an Emergency Use Authorization (EUA). This EUA will remain  in effect (meaning this test can be used) for the duration of the COVID-19 declaration under Section 564(b)(1) of the Act, 21 U.S.C.section 360bbb-3(b)(1), unless the authorization is terminated  or revoked sooner.       Influenza A by PCR NEGATIVE NEGATIVE Final   Influenza B by PCR NEGATIVE NEGATIVE Final    Comment: (NOTE) The Xpert Xpress SARS-CoV-2/FLU/RSV plus assay is intended as an aid in the diagnosis of influenza  from Nasopharyngeal swab specimens and should not be used as a sole basis for treatment. Nasal washings and aspirates are unacceptable for Xpert Xpress SARS-CoV-2/FLU/RSV testing.  Fact Sheet for Patients: BloggerCourse.com  Fact Sheet for Healthcare Providers: SeriousBroker.it  This test is not yet approved or cleared by the [place] FDA and has been authorized for detection and/or diagnosis of SARS-CoV-2 by FDA under an Emergency Use Authorization (EUA). This EUA will remain in effect (meaning this test can be used) for the duration of the COVID-19 declaration  under Section 564(b)(1) of the Act, 21 U.S.C. section 360bbb-3(b)(1), unless the authorization is terminated or revoked.  Performed at [facility], [address], [place], [place] [address]      Studies: DG Chest 1 View  Result Date: 07/15/2020 CLINICAL DATA:  Altered mental status. EXAM: CHEST  1 VIEW COMPARISON:  Radiographs 10/23/2017. FINDINGS: 1350 hours. Lower lung volumes. The heart size and mediastinal contours are stable. The lungs appear clear. There is no pleural effusion or pneumothorax. Loop recorder overlies the left lower chest. There are mild degenerative changes at both shoulders with a calcification adjacent to the left greater tuberosity which could reflect hydroxyapatite deposition. No acute osseous findings. Telemetry leads overlie the chest. IMPRESSION: No active cardiopulmonary process. Possible left shoulder calcific tendinitis/bursitis. Electronically Signed   By: Carey Bullocks M.D.   On: 07/15/2020 14:18   DG Forearm Right  Result Date: 07/15/2020 CLINICAL DATA:  Altered mental status.  History of stroke. EXAM: RIGHT FOREARM - 2 VIEW; RIGHT HUMERUS - 2+ VIEW COMPARISON:  None. FINDINGS: No evidence of acute fracture or dislocation. There are mild degenerative changes at the shoulder and elbow. No significant elbow joint effusion. Ulnar minus variance noted at the wrist. There is an IV in the antecubital fossa. No focal soft tissue abnormality or foreign body identified. IMPRESSION: No acute findings identified within the right upper arm or forearm. Mild degenerative changes. Electronically Signed   By: Carey Bullocks M.D.   On: 07/15/2020 14:20   DG Abdomen 1 View  Result Date: 07/15/2020 CLINICAL DATA:  Rule out metallic foreign body prior to MRI EXAM: ABDOMEN - 1 VIEW COMPARISON:  None. FINDINGS: Loop recorder is noted in the left anterior chest wall. Scattered large and small bowel gas is noted. No retained metallic foreign body is identified. Degenerative  changes of lumbar spine are noted. IMPRESSION: No retained metallic foreign body identified. Loop recorder in the left chest wall is noted. Electronically Signed   By: Alcide Clever M.D.   On: 07/15/2020 17:27   CT HEAD WO CONTRAST  Result Date: 07/15/2020 CLINICAL DATA:  Neuro deficit, acute stroke suspected. Mental status change. Dysphagia for 2 days. EXAM: CT HEAD WITHOUT CONTRAST TECHNIQUE: Contiguous axial images were obtained from the base of the skull through the vertex without intravenous contrast. COMPARISON:  CT head 01/29/2018. FINDINGS: Brain: There is no evidence of acute intracranial hemorrhage, mass lesion, brain edema or extra-axial fluid collection. Chronic encephalomalacia in the left parietal lobe appears stable, consistent with an old cortical and subcortical infarct. There is no CT evidence of acute cortical infarction. Mild progression of ventriculomegaly. Vascular: Mild intracranial vascular calcifications. No hyperdense vessel identified. Skull: Negative for fracture or focal lesion. Mild calvarial hyperostosis. Sinuses/Orbits: Minimal dependent mucosal thickening in the left division of the sphenoid sinus. The additional visualized paranasal sinuses are clear without air-fluid levels. The mastoid air cells and middle ears are clear. Other: None. IMPRESSION: 1. No definite acute intracranial  findings. Stable old left parietal infarct. 2. Slight progression in ventricular dilatation without secondary signs of obstructive hydrocephalus. Electronically Signed   By: Carey Bullocks M.D.   On: 07/15/2020 14:16   MR ANGIO HEAD WO CONTRAST  Result Date: 07/15/2020 CLINICAL DATA:  Initial evaluation for neuro deficit, stroke suspected. EXAM: MRI HEAD WITHOUT CONTRAST MRA HEAD WITHOUT CONTRAST MRA NECK WITHOUT CONTRAST TECHNIQUE: Multiplanar, multiecho pulse sequences of the brain and surrounding structures were obtained without intravenous contrast. Angiographic images of the Circle of Willis  were obtained using MRA technique without intravenous contrast. Angiographic images of the neck were obtained using MRA technique without intravenous contrast. Carotid stenosis measurements (when applicable) are obtained utilizing NASCET criteria, using the distal internal carotid diameter as the denominator. COMPARISON:  Prior CT from earlier the same day. FINDINGS: MRI HEAD FINDINGS Brain: Generalized age-related cerebral atrophy. Patchy and confluent T2/FLAIR hyperintensity within the periventricular deep white matter both cerebral hemispheres most consistent with chronic small vessel ischemic disease, moderate in nature. Encephalomalacia and gliosis involving the left parietotemporal region consistent with a chronic left MCA distribution infarct. Mild petechial hemorrhage without frank hemorrhagic transformation. No significant regional mass effect. No other evidence for acute or subacute ischemia. Gray-white matter differentiation otherwise maintained. No other areas of chronic cortical infarction. No mass lesion, midline shift or mass effect. No hydrocephalus or extra-axial fluid collection. Pituitary gland suprasellar region normal. Midline structures intact. Immediately anterior and adjacent to the chronic left MCA territory infarct, there are scattered areas of restricted diffusion involving the cortex of the left posterior frontotemporal region, consistent with an acute left MCA territory infarct (series 5, image 77). Associated Vascular: Hypoplastic left vertebral artery not well seen. Major intracranial vascular flow voids are otherwise maintained. Skull and upper cervical spine: Craniocervical junction within normal limits. Bone marrow signal intensity normal. No scalp soft tissue abnormality. Sinuses/Orbits: Globes and orbital soft tissues within normal limits. Paranasal sinuses are largely clear. No mastoid effusion. Inner ear structures grossly normal. Other: None. MRA HEAD FINDINGS ANTERIOR  CIRCULATION: Visualized distal cervical segments of the internal carotid arteries are patent with antegrade flow. Petrous, cavernous, and supraclinoid segments patent without flow-limiting stenosis. A1 segments patent bilaterally. Normal anterior communicating artery complex. Anterior cerebral arteries patent to their distal aspects without stenosis. Right M1 segment widely patent. Normal right MCA bifurcation. Distal right MCA branches well perfused. Left M1 segment patent proximally. There are severe stenoses involving the distal left M1 segment, left MCA bifurcation, and adjacent proximal M2 branches (series 1049, image 6). There appears to be involvement of both superior and inferior divisions. Left MCA branches perfused distally, with no definite proximal M2 branch occlusion. POSTERIOR CIRCULATION: Right vertebral artery dominant and patent to the vertebrobasilar junction. Left vertebral artery markedly hypoplastic and not well visualized, possibly occluded. Right PICA origin patent. Focal moderate stenosis at the vertebrobasilar junction (series 1055, image 9). This is positioned at the takeoff of a dominant left AICA. Extensive atheromatous irregularity within the left AICA. Basilar widely patent distally. Superior cerebral arteries patent bilaterally. Both PCAs supplied via a hypoplastic P1 segments and robust bilateral posterior communicating arteries. Diffuse atheromatous irregularity throughout the PCAs bilaterally without proximal high-grade stenosis. No intracranial aneurysm. MRA NECK FINDINGS AORTIC ARCH: Examination technically limited by motion and lack of IV contrast. Partially visualized aortic arch grossly within normal limits for caliber. Normal branch pattern. No obvious high-grade stenosis about the origin of the great vessels. RIGHT CAROTID SYSTEM: Partially visualized right CCA patent without stenosis. No significant  atheromatous stenosis about the right carotid bulb or proximal right ICA.  Right ICA patent distally without stenosis, evidence for dissection or occlusion. LEFT CAROTID SYSTEM: Partially visualized left CCA patent without appreciable stenosis. No significant atheromatous narrowing about the left carotid bulb or proximal left ICA. Left ICA patent distally without stenosis, evidence for dissection or occlusion. VERTEBRAL ARTERIES: Both vertebral arteries appear to arise from the subclavian arteries. Strongly dominant right vertebral artery patent within the neck without appreciable stenosis or other vascular abnormality. Left vertebral artery is markedly hypoplastic and not well seen, possibly at least partially occluded. IMPRESSION: MRI HEAD IMPRESSION: 1. Scattered acute ischemic cortical infarct involving the left frontotemporal region. Associated mild petechial hemorrhage without frank hemorrhagic transformation or significant mass effect. 2. Underlying chronic left MCA distribution infarct with moderate chronic microvascular ischemic disease. MRA HEAD IMPRESSION: 1. Negative MRA for large vessel occlusion. 2. Severe stenoses involving the distal left M1 segment, left MCA bifurcation, and adjacent proximal M2 branches, with probable involvement of both superior and inferior divisions. 3. Moderate stenosis at the vertebrobasilar junction. 4. Nonvisualization of the left vertebral artery. MRA NECK IMPRESSION: 1. Technically limited exam by motion and lack of IV contrast. 2. Patency of both carotid artery systems within the neck without appreciable stenosis or other acute vascular abnormality. 3. Strongly dominant right vertebral artery patent within the neck. Hypoplastic left vertebral artery not well seen, and may be occluded/partially occluded. Electronically Signed   By: Rise MuBenjamin  McClintock M.D.   On: 07/15/2020 19:54   MR ANGIO NECK WO CONTRAST  Result Date: 07/15/2020 CLINICAL DATA:  Initial evaluation for neuro deficit, stroke suspected. EXAM: MRI HEAD WITHOUT CONTRAST MRA HEAD  WITHOUT CONTRAST MRA NECK WITHOUT CONTRAST TECHNIQUE: Multiplanar, multiecho pulse sequences of the brain and surrounding structures were obtained without intravenous contrast. Angiographic images of the Circle of Willis were obtained using MRA technique without intravenous contrast. Angiographic images of the neck were obtained using MRA technique without intravenous contrast. Carotid stenosis measurements (when applicable) are obtained utilizing NASCET criteria, using the distal internal carotid diameter as the denominator. COMPARISON:  Prior CT from earlier the same day. FINDINGS: MRI HEAD FINDINGS Brain: Generalized age-related cerebral atrophy. Patchy and confluent T2/FLAIR hyperintensity within the periventricular deep white matter both cerebral hemispheres most consistent with chronic small vessel ischemic disease, moderate in nature. Encephalomalacia and gliosis involving the left parietotemporal region consistent with a chronic left MCA distribution infarct. Mild petechial hemorrhage without frank hemorrhagic transformation. No significant regional mass effect. No other evidence for acute or subacute ischemia. Gray-white matter differentiation otherwise maintained. No other areas of chronic cortical infarction. No mass lesion, midline shift or mass effect. No hydrocephalus or extra-axial fluid collection. Pituitary gland suprasellar region normal. Midline structures intact. Immediately anterior and adjacent to the chronic left MCA territory infarct, there are scattered areas of restricted diffusion involving the cortex of the left posterior frontotemporal region, consistent with an acute left MCA territory infarct (series 5, image 77). Associated Vascular: Hypoplastic left vertebral artery not well seen. Major intracranial vascular flow voids are otherwise maintained. Skull and upper cervical spine: Craniocervical junction within normal limits. Bone marrow signal intensity normal. No scalp soft tissue  abnormality. Sinuses/Orbits: Globes and orbital soft tissues within normal limits. Paranasal sinuses are largely clear. No mastoid effusion. Inner ear structures grossly normal. Other: None. MRA HEAD FINDINGS ANTERIOR CIRCULATION: Visualized distal cervical segments of the internal carotid arteries are patent with antegrade flow. Petrous, cavernous, and supraclinoid segments patent without flow-limiting stenosis. A1  segments patent bilaterally. Normal anterior communicating artery complex. Anterior cerebral arteries patent to their distal aspects without stenosis. Right M1 segment widely patent. Normal right MCA bifurcation. Distal right MCA branches well perfused. Left M1 segment patent proximally. There are severe stenoses involving the distal left M1 segment, left MCA bifurcation, and adjacent proximal M2 branches (series 1049, image 6). There appears to be involvement of both superior and inferior divisions. Left MCA branches perfused distally, with no definite proximal M2 branch occlusion. POSTERIOR CIRCULATION: Right vertebral artery dominant and patent to the vertebrobasilar junction. Left vertebral artery markedly hypoplastic and not well visualized, possibly occluded. Right PICA origin patent. Focal moderate stenosis at the vertebrobasilar junction (series 1055, image 9). This is positioned at the takeoff of a dominant left AICA. Extensive atheromatous irregularity within the left AICA. Basilar widely patent distally. Superior cerebral arteries patent bilaterally. Both PCAs supplied via a hypoplastic P1 segments and robust bilateral posterior communicating arteries. Diffuse atheromatous irregularity throughout the PCAs bilaterally without proximal high-grade stenosis. No intracranial aneurysm. MRA NECK FINDINGS AORTIC ARCH: Examination technically limited by motion and lack of IV contrast. Partially visualized aortic arch grossly within normal limits for caliber. Normal branch pattern. No obvious high-grade  stenosis about the origin of the great vessels. RIGHT CAROTID SYSTEM: Partially visualized right CCA patent without stenosis. No significant atheromatous stenosis about the right carotid bulb or proximal right ICA. Right ICA patent distally without stenosis, evidence for dissection or occlusion. LEFT CAROTID SYSTEM: Partially visualized left CCA patent without appreciable stenosis. No significant atheromatous narrowing about the left carotid bulb or proximal left ICA. Left ICA patent distally without stenosis, evidence for dissection or occlusion. VERTEBRAL ARTERIES: Both vertebral arteries appear to arise from the subclavian arteries. Strongly dominant right vertebral artery patent within the neck without appreciable stenosis or other vascular abnormality. Left vertebral artery is markedly hypoplastic and not well seen, possibly at least partially occluded. IMPRESSION: MRI HEAD IMPRESSION: 1. Scattered acute ischemic cortical infarct involving the left frontotemporal region. Associated mild petechial hemorrhage without frank hemorrhagic transformation or significant mass effect. 2. Underlying chronic left MCA distribution infarct with moderate chronic microvascular ischemic disease. MRA HEAD IMPRESSION: 1. Negative MRA for large vessel occlusion. 2. Severe stenoses involving the distal left M1 segment, left MCA bifurcation, and adjacent proximal M2 branches, with probable involvement of both superior and inferior divisions. 3. Moderate stenosis at the vertebrobasilar junction. 4. Nonvisualization of the left vertebral artery. MRA NECK IMPRESSION: 1. Technically limited exam by motion and lack of IV contrast. 2. Patency of both carotid artery systems within the neck without appreciable stenosis or other acute vascular abnormality. 3. Strongly dominant right vertebral artery patent within the neck. Hypoplastic left vertebral artery not well seen, and may be occluded/partially occluded. Electronically Signed   By:  Rise Mu M.D.   On: 07/15/2020 19:54   MR Brain Wo Contrast (neuro protocol)  Result Date: 07/15/2020 CLINICAL DATA:  Initial evaluation for neuro deficit, stroke suspected. EXAM: MRI HEAD WITHOUT CONTRAST MRA HEAD WITHOUT CONTRAST MRA NECK WITHOUT CONTRAST TECHNIQUE: Multiplanar, multiecho pulse sequences of the brain and surrounding structures were obtained without intravenous contrast. Angiographic images of the Circle of Willis were obtained using MRA technique without intravenous contrast. Angiographic images of the neck were obtained using MRA technique without intravenous contrast. Carotid stenosis measurements (when applicable) are obtained utilizing NASCET criteria, using the distal internal carotid diameter as the denominator. COMPARISON:  Prior CT from earlier the same day. FINDINGS: MRI HEAD FINDINGS Brain: Generalized  age-related cerebral atrophy. Patchy and confluent T2/FLAIR hyperintensity within the periventricular deep white matter both cerebral hemispheres most consistent with chronic small vessel ischemic disease, moderate in nature. Encephalomalacia and gliosis involving the left parietotemporal region consistent with a chronic left MCA distribution infarct. Mild petechial hemorrhage without frank hemorrhagic transformation. No significant regional mass effect. No other evidence for acute or subacute ischemia. Gray-white matter differentiation otherwise maintained. No other areas of chronic cortical infarction. No mass lesion, midline shift or mass effect. No hydrocephalus or extra-axial fluid collection. Pituitary gland suprasellar region normal. Midline structures intact. Immediately anterior and adjacent to the chronic left MCA territory infarct, there are scattered areas of restricted diffusion involving the cortex of the left posterior frontotemporal region, consistent with an acute left MCA territory infarct (series 5, image 77). Associated Vascular: Hypoplastic left  vertebral artery not well seen. Major intracranial vascular flow voids are otherwise maintained. Skull and upper cervical spine: Craniocervical junction within normal limits. Bone marrow signal intensity normal. No scalp soft tissue abnormality. Sinuses/Orbits: Globes and orbital soft tissues within normal limits. Paranasal sinuses are largely clear. No mastoid effusion. Inner ear structures grossly normal. Other: None. MRA HEAD FINDINGS ANTERIOR CIRCULATION: Visualized distal cervical segments of the internal carotid arteries are patent with antegrade flow. Petrous, cavernous, and supraclinoid segments patent without flow-limiting stenosis. A1 segments patent bilaterally. Normal anterior communicating artery complex. Anterior cerebral arteries patent to their distal aspects without stenosis. Right M1 segment widely patent. Normal right MCA bifurcation. Distal right MCA branches well perfused. Left M1 segment patent proximally. There are severe stenoses involving the distal left M1 segment, left MCA bifurcation, and adjacent proximal M2 branches (series 1049, image 6). There appears to be involvement of both superior and inferior divisions. Left MCA branches perfused distally, with no definite proximal M2 branch occlusion. POSTERIOR CIRCULATION: Right vertebral artery dominant and patent to the vertebrobasilar junction. Left vertebral artery markedly hypoplastic and not well visualized, possibly occluded. Right PICA origin patent. Focal moderate stenosis at the vertebrobasilar junction (series 1055, image 9). This is positioned at the takeoff of a dominant left AICA. Extensive atheromatous irregularity within the left AICA. Basilar widely patent distally. Superior cerebral arteries patent bilaterally. Both PCAs supplied via a hypoplastic P1 segments and robust bilateral posterior communicating arteries. Diffuse atheromatous irregularity throughout the PCAs bilaterally without proximal high-grade stenosis. No  intracranial aneurysm. MRA NECK FINDINGS AORTIC ARCH: Examination technically limited by motion and lack of IV contrast. Partially visualized aortic arch grossly within normal limits for caliber. Normal branch pattern. No obvious high-grade stenosis about the origin of the great vessels. RIGHT CAROTID SYSTEM: Partially visualized right CCA patent without stenosis. No significant atheromatous stenosis about the right carotid bulb or proximal right ICA. Right ICA patent distally without stenosis, evidence for dissection or occlusion. LEFT CAROTID SYSTEM: Partially visualized left CCA patent without appreciable stenosis. No significant atheromatous narrowing about the left carotid bulb or proximal left ICA. Left ICA patent distally without stenosis, evidence for dissection or occlusion. VERTEBRAL ARTERIES: Both vertebral arteries appear to arise from the subclavian arteries. Strongly dominant right vertebral artery patent within the neck without appreciable stenosis or other vascular abnormality. Left vertebral artery is markedly hypoplastic and not well seen, possibly at least partially occluded. IMPRESSION: MRI HEAD IMPRESSION: 1. Scattered acute ischemic cortical infarct involving the left frontotemporal region. Associated mild petechial hemorrhage without frank hemorrhagic transformation or significant mass effect. 2. Underlying chronic left MCA distribution infarct with moderate chronic microvascular ischemic disease. MRA HEAD IMPRESSION: 1.  Negative MRA for large vessel occlusion. 2. Severe stenoses involving the distal left M1 segment, left MCA bifurcation, and adjacent proximal M2 branches, with probable involvement of both superior and inferior divisions. 3. Moderate stenosis at the vertebrobasilar junction. 4. Nonvisualization of the left vertebral artery. MRA NECK IMPRESSION: 1. Technically limited exam by motion and lack of IV contrast. 2. Patency of both carotid artery systems within the neck without  appreciable stenosis or other acute vascular abnormality. 3. Strongly dominant right vertebral artery patent within the neck. Hypoplastic left vertebral artery not well seen, and may be occluded/partially occluded. Electronically Signed   By: Rise Mu M.D.   On: 07/15/2020 19:54   DG Humerus Right  Result Date: 07/15/2020 CLINICAL DATA:  Altered mental status.  History of stroke. EXAM: RIGHT FOREARM - 2 VIEW; RIGHT HUMERUS - 2+ VIEW COMPARISON:  None. FINDINGS: No evidence of acute fracture or dislocation. There are mild degenerative changes at the shoulder and elbow. No significant elbow joint effusion. Ulnar minus variance noted at the wrist. There is an IV in the antecubital fossa. No focal soft tissue abnormality or foreign body identified. IMPRESSION: No acute findings identified within the right upper arm or forearm. Mild degenerative changes. Electronically Signed   By: Carey Bullocks M.D.   On: 07/15/2020 14:20      Joycelyn Das, MD  [facility] 07/16/2020  If 7PM-7AM, please contact night-coverage

## 2020-07-16 NOTE — NC FL2 (Signed)
[place] MEDICAID FL2 LEVEL OF CARE SCREENING TOOL     IDENTIFICATION  Patient Name: Kara Mendoza Birthdate: 1950-04-24 Sex: female Admission Date (Current Location): 07/15/2020  [facility] and [place] Number:  Producer, television/film/video and Address:  [facility], [address] [place], [place] [address]      Provider Number: [id number]  Attending Physician Name and Address:  Joycelyn Das, MD  Relative Name and Phone Number:  [name], [phone]    Current Level of Care: Hospital Recommended Level of Care: Skilled Nursing Facility Prior Approval Number:    Date Approved/Denied:   PASRR Number: Pending  Discharge Plan: SNF    Current Diagnoses: Patient Active Problem List   Diagnosis Date Noted   Acute CVA (cerebrovascular accident) (HCC) 07/15/2020   Dysuria 05/29/2018   Residual cognitive deficit as late effect of stroke 04/26/2016   Hemiparesis affecting left side as late effect of cerebrovascular accident (CVA) (HCC) 04/26/2016   Carpal tunnel syndrome on left 11/29/2015   DDD (degenerative disc disease), cervical 11/29/2015   Depression 11/29/2015   Chronic pain of left knee 09/04/2015   Headache 09/04/2015   Paroxysmal atrial fibrillation (HCC) 07/22/2015   Complex regional pain syndrome type 2 of upper extremity 11/05/2013   Carpal tunnel syndrome on right 10/22/2013   Chronic embolism and thrombosis of deep vein of left upper extremity (HCC) 10/22/2013   Cardiac device in situ 10/14/2013   Essential hypertension 09/30/2013   Complex regional pain syndrome type 1 of both upper extremities 09/30/2013   Diabetes mellitus (HCC) 09/28/2013   Hyperthyroidism 09/28/2013   Median nerve neuritis 09/28/2013   Ataxia, late effect of cerebrovascular disease 09/28/2013   Spastic hemiplegia affecting dominant side (HCC) 09/28/2013   CVA (cerebral vascular accident) (HCC) 08/15/2013   Cerebral embolism with cerebral infarction (HCC)  08/09/2013   Dysarthria 08/09/2013   Abnormality of gait 05/01/2012   Incontinence of urine 05/01/2012   Tobacco abuse 01/28/2012   Tobacco dependence syndrome 01/28/2012   Plantar fasciitis 09/01/2010   Allergic rhinitis 07/11/2010   Persistent cough 06/16/2010   Asthma night-time symptoms 06/14/2010   GERD (gastroesophageal reflux disease) 05/26/2010   Stroke (HCC) 05/26/2010   Hyperlipidemia 05/26/2010   Osteoarthritis 05/26/2010   Mononeuritis 05/26/2010    Orientation RESPIRATION BLADDER Height & Weight     Self, Time, Place, Situation  Normal Continent Weight:   Height:     BEHAVIORAL SYMPTOMS/MOOD NEUROLOGICAL BOWEL NUTRITION STATUS      Continent Diet (carb modified)  AMBULATORY STATUS COMMUNICATION OF NEEDS Skin   Limited Assist Verbally Normal                       Personal Care Assistance Level of Assistance  Bathing, Feeding, Dressing Bathing Assistance: Limited assistance Feeding assistance: Independent Dressing Assistance: Limited assistance     Functional Limitations Info             SPECIAL CARE FACTORS FREQUENCY  PT (By licensed PT), OT (By licensed OT)     PT Frequency: 5x weekly OT Frequency: 5x weekly            Contractures Contractures Info: Not present    Additional Factors Info  Code Status, Allergies Code Status Info: Full Allergies Info: penicillin, diclofenac, asprin           Current Medications (07/16/2020):  This is the current hospital active medication list Current Facility-Administered Medications  Medication Dose Route Frequency Provider Last  Rate Last Admin   acetaminophen (TYLENOL) tablet 650 mg  650 mg Oral Q4H PRN Charlsie Quest, MD       Or   acetaminophen (TYLENOL) 160 MG/5ML solution 650 mg  650 mg Per Tube Q4H PRN Charlsie Quest, MD       Or   acetaminophen (TYLENOL) suppository 650 mg  650 mg Rectal Q4H PRN Darreld Mclean R, MD       albuterol (PROVENTIL) (2.5 MG/3ML) 0.083% nebulizer solution 2.5  mg  2.5 mg Inhalation Q6H PRN Charlsie Quest, MD       atorvastatin (LIPITOR) tablet 40 mg  40 mg Oral QHS Pokhrel, Laxman, MD       clopidogrel (PLAVIX) tablet 75 mg  75 mg Oral Daily Darreld Mclean R, MD   75 mg at 07/16/20 1027   DULoxetine (CYMBALTA) DR capsule 60 mg  60 mg Oral Daily Darreld Mclean R, MD   60 mg at 07/16/20 1027   enoxaparin (LOVENOX) injection 40 mg  40 mg Subcutaneous Q24H Darreld Mclean R, MD   40 mg at 07/15/20 2251   gabapentin (NEURONTIN) capsule 300 mg  300 mg Oral TID Charlsie Quest, MD   300 mg at 07/16/20 1027   labetalol (NORMODYNE) injection 10 mg  10 mg Intravenous Q4H PRN Charlsie Quest, MD       oxyCODONE-acetaminophen (PERCOCET/ROXICET) 5-325 MG per tablet 1 tablet  1 tablet Oral Q6H PRN Charlsie Quest, MD       And   oxyCODONE (Oxy IR/ROXICODONE) immediate release tablet 5 mg  5 mg Oral Q6H PRN Charlsie Quest, MD       senna-docusate (Senokot-S) tablet 1 tablet  1 tablet Oral QHS PRN Charlsie Quest, MD         Discharge Medications: Please see discharge summary for a list of discharge medications.  Relevant Imaging Results:  Relevant Lab Results:   Additional Information SSN [id number]  [name], LCSW

## 2020-07-16 NOTE — TOC Initial Note (Signed)
Transition of Care [facility]) - Initial/Assessment Note    Patient Details  Name: Kara Mendoza MRN: [id number] Date of Birth: 1950-10-13  Transition of Care [facility]) CM/SW Contact:    Carley Hammed, LCSWA Phone Number: 07/16/2020, 3:50 PM  Clinical Narrative:                 CSW spoke with pt and daughter, Gearldine Bienenstock, at bedside. They are agreeable to SNF placement, and have not been before. They would like to remain in [place] and have no preference for facility. They were advised of the medicare.gov list and will research available facilities. Pt has NOT had covid vaccines and has declined receiving them.   CSW confirmed that the plan is to return home with her son, she stated yes, but had some reservations. She was concerned about him working and not being able to care for her. We discussed private duty nursing and daughter will look into it. CSW will complete workup and faxout. SW will continue to follow for DC needs.  Expected Discharge Plan: Skilled Nursing Facility Barriers to Discharge: Insurance Authorization, Continued Medical Work up, SNF Pending bed offer   Patient Goals and CMS Choice Patient states their goals for this hospitalization and ongoing recovery are:: Pt and daughter agreeable to SNF placement. CMS Medicare.gov Compare Post Acute Care list provided to:: Patient Choice offered to / list presented to : Patient, Adult Children  Expected Discharge Plan and Services Expected Discharge Plan: Skilled Nursing Facility     Post Acute Care Choice: Skilled Nursing Facility Living arrangements for the past 2 months: Single Family Home                                      Prior Living Arrangements/Services Living arrangements for the past 2 months: Single Family Home Lives with:: Adult Children   Do you feel safe going back to the place where you live?: Yes      Need for Family Participation in Patient Care: Yes (Comment) Care giver support system in place?: Yes  (comment)   Criminal Activity/Legal Involvement Pertinent to Current Situation/Hospitalization: No - Comment as needed  Activities of Daily Living      Permission Sought/Granted Permission sought to share information with : Family Supports Permission granted to share information with : Yes, Verbal Permission Granted  Share Information with NAME: Albertina Leise     Permission granted to share info w Relationship: Daughter  Permission granted to share info w Contact Information: [phone]  Emotional Assessment Appearance:: Appears stated age Attitude/Demeanor/Rapport: Engaged Affect (typically observed): Appropriate Orientation: : Oriented to Self, Oriented to Place, Oriented to  Time, Oriented to Situation Alcohol / Substance Use: Not Applicable Psych Involvement: No (comment)  Admission diagnosis:  Acute ischemic stroke (HCC) [I63.9] Acute CVA (cerebrovascular accident) (HCC) [I63.9] Altered mental status, unspecified altered mental status type [R41.82] AMS (altered mental status) [R41.82] Stroke [facility]) [I63.9] Patient Active Problem List   Diagnosis Date Noted   Acute CVA (cerebrovascular accident) (HCC) 07/15/2020   Dysuria 05/29/2018   Residual cognitive deficit as late effect of stroke 04/26/2016   Hemiparesis affecting left side as late effect of cerebrovascular accident (CVA) (HCC) 04/26/2016   Carpal tunnel syndrome on left 11/29/2015   DDD (degenerative disc disease), cervical 11/29/2015   Depression 11/29/2015   Chronic pain of left knee 09/04/2015   Headache 09/04/2015   Paroxysmal atrial fibrillation (HCC)  07/22/2015   Complex regional pain syndrome type 2 of upper extremity 11/05/2013   Carpal tunnel syndrome on right 10/22/2013   Chronic embolism and thrombosis of deep vein of left upper extremity (HCC) 10/22/2013   Cardiac device in situ 10/14/2013   Essential hypertension 09/30/2013   Complex regional pain syndrome type 1 of both upper extremities  09/30/2013   Diabetes mellitus (HCC) 09/28/2013   Hyperthyroidism 09/28/2013   Median nerve neuritis 09/28/2013   Ataxia, late effect of cerebrovascular disease 09/28/2013   Spastic hemiplegia affecting dominant side (HCC) 09/28/2013   CVA (cerebral vascular accident) (HCC) 08/15/2013   Cerebral embolism with cerebral infarction (HCC) 08/09/2013   Dysarthria 08/09/2013   Abnormality of gait 05/01/2012   Incontinence of urine 05/01/2012   Tobacco abuse 01/28/2012   Tobacco dependence syndrome 01/28/2012   Plantar fasciitis 09/01/2010   Allergic rhinitis 07/11/2010   Persistent cough 06/16/2010   Asthma night-time symptoms 06/14/2010   GERD (gastroesophageal reflux disease) 05/26/2010   Stroke (HCC) 05/26/2010   Hyperlipidemia 05/26/2010   Osteoarthritis 05/26/2010   Mononeuritis 05/26/2010   PCP:  Olive Bass, FNP Pharmacy:   [id number] [place], [place] - [address] AT [facility] [address] [place] [address] Phone: [phone] Fax: [phone]     Social Determinants of Health (SDOH) Interventions    Readmission Risk Interventions No flowsheet data found.

## 2020-07-17 ENCOUNTER — Other Ambulatory Visit: Payer: Self-pay | Admitting: Cardiology

## 2020-07-17 DIAGNOSIS — I639 Cerebral infarction, unspecified: Secondary | ICD-10-CM

## 2020-07-17 LAB — BASIC METABOLIC PANEL
Anion gap: 5 (ref 5–15)
BUN: 12 mg/dL (ref 8–23)
CO2: 28 mmol/L (ref 22–32)
Calcium: 10.4 mg/dL — ABNORMAL HIGH (ref 8.9–10.3)
Chloride: 105 mmol/L (ref 98–111)
Creatinine, Ser: 0.73 mg/dL (ref 0.44–1.00)
GFR, Estimated: >60 mL/min (ref >60)
Glucose, Bld: 117 mg/dL — ABNORMAL HIGH (ref 70–99)
Potassium: 4.3 mmol/L (ref 3.5–5.1)
Sodium: 138 mmol/L (ref 135–145)

## 2020-07-17 LAB — CBC
HCT: 42.7 % (ref 36.0–46.0)
Hemoglobin: 14.3 g/dL (ref 12.0–15.0)
MCH: 30 pg (ref 26.0–34.0)
MCHC: 33.5 g/dL (ref 30.0–36.0)
MCV: 89.5 fL (ref 80.0–100.0)
Platelets: 364 10*3/uL (ref 150–400)
RBC: 4.77 MIL/uL (ref 3.87–5.11)
RDW: 12.1 % (ref 11.5–15.5)
WBC: 8.5 10*3/uL (ref 4.0–10.5)
nRBC: 0 % (ref 0.0–0.2)

## 2020-07-17 LAB — MAGNESIUM: Magnesium: 1.9 mg/dL (ref 1.7–2.4)

## 2020-07-17 LAB — PHOSPHORUS: Phosphorus: 3.1 mg/dL (ref 2.5–4.6)

## 2020-07-17 NOTE — Progress Notes (Signed)
   30 day monitor requested via neurology for CVA work up. Patient has old loop recorder but unable to obtain data 2/2 to battery life ( placed in 2015). Notes indicate she did have isolated episode of atrial fibrillation in 2017 but declined Xarelto at that time. Neurology request monitor to determine reoccurrence of afib.  Monitor ordered with Dr. Graciela Husbands to read she has been seen by cardiology for her loop recorder in the past.

## 2020-07-17 NOTE — Progress Notes (Signed)
PROGRESS NOTE    Kara Mendoza  ZOX:[id number] DOB: Aug 30, 1950 DOA: 07/15/2020 PCP: Olive Bass, FNP   No chief complaint on file.   Brief Narrative:  70 year old lady with prior h/o CVA , hypertension, hyperlipidemia, GERD, asthma, DEPRESSION, anxiety, presents with decreased appetite and ambulation. Initial CT head stable old parietal infarct.   Assessment & Plan:   Principal Problem:   Acute CVA (cerebrovascular accident) (HCC) Active Problems:   Stroke (HCC)   Hyperlipidemia   Essential hypertension  Left scattered MCA stroke with severe left MCA distal M1 Stenosis Code stroke , MRI showing Scattered acute ischemic cortical infarct involving the left frontotemporal region associated mild petechial hemorrhage. Neurology consulted , plan to continue with plavix and statin.  MRA shows severe stenosis of the distal left M1 Segment.  Echo shows preserved LVEF, and grade 1 diastolic dysfunction.  Cardiology consulted for 30 day event monitor placement as outpatient.  LDL is 110 and A1c is 6.4.    Hypertension;  Well controlled BP parameters   Hyperlipidemia:  Resume statin on discharge.    Type 2 DM, non insulin dependent:  Resume metformin on discharge.    DVT prophylaxis: (Lovenox) Code Status: full code Family Communication: (none at bedside Disposition:   Status is: Inpatient  Remains inpatient appropriate because:Unsafe d/c plan  Dispo: The patient is from: Home              Anticipated d/c is to: SNF              Patient currently is not medically stable to d/c.   Difficult to place patient No       Consultants:  neurology.   Procedures: echocardiogram.   Antimicrobials: none.    Subjective: No chest pain or sob.   Objective: Vitals:   07/17/20 0445 07/17/20 0729 07/17/20 1202 07/17/20 1646  BP: 137/75 (!) 143/67 127/78 (!) 144/77  Pulse: 71 66 74 66  Resp: Temp: 98.8 F (37.1 C) 98.5 F (36.9 C) 98 F (36.7  C) 99 F (37.2 C)  TempSrc: Oral Oral Oral Oral  SpO2: 99% 100% 100% 98%    Intake/Output Summary (Last 24 hours) at 07/17/2020 1706 Last data filed at 07/17/2020 1300 Gross per 24 hour  Intake 720 ml  Output 170 ml  Net 550 ml   There were no vitals filed for this visit.  Examination:  General exam: Appears calm and comfortable  Respiratory system: Clear to auscultation. Respiratory effort normal. Cardiovascular system: S1 & S2 heard, RRR. No pedal edema. Gastrointestinal system: Abdomen is nondistended, soft and nontender.  Normal bowel sounds heard. Central nervous system: Alert and oriented. No focal neurological deficits. Extremities: Symmetric 5 x 5 power. Skin: No rashes, lesions or ulcers Psychiatry: Mood & affect appropriate.     Data Reviewed: I have personally reviewed following labs and imaging studies  CBC: Recent Labs  Lab 07/15/20 1321 07/17/20 0232  WBC 10.2 8.5  NEUTROABS 8.1*  --   HGB 15.8* 14.3  HCT 48.3* 42.7  MCV 91.1 89.5  PLT 407* 364    Basic Metabolic Panel: Recent Labs  Lab 07/15/20 1321 07/17/20 0232  NA 138 138  K 4.1 4.3  CL 102 105  CO2 26 28  GLUCOSE 152* 117*  BUN 8 12  CREATININE 0.76 0.73  CALCIUM 10.3 10.4*  MG  --  1.9  PHOS  --  3.1    GFR: CrCl cannot be calculated (  Unknown ideal weight.).  Liver Function Tests: Recent Labs  Lab 07/15/20 1321  AST 25  ALT 31  ALKPHOS 71  BILITOT 1.0  PROT 6.8  ALBUMIN 3.9    CBG: No results for input(s): GLUCAP in the last 168 hours.   Recent Results (from the past 240 hour(s))  Resp Panel by RT-PCR (Flu A&B, Covid) Nasopharyngeal Swab     Status: None   Collection Time: 07/15/20  1:21 PM   Specimen: Nasopharyngeal Swab; Nasopharyngeal(NP) swabs in vial transport medium  Result Value Ref Range Status   SARS Coronavirus 2 by RT PCR NEGATIVE NEGATIVE Final    Comment: (NOTE) SARS-CoV-2 target nucleic acids are NOT DETECTED.  The SARS-CoV-2 RNA is generally  detectable in upper respiratory specimens during the acute phase of infection. The lowest concentration of SARS-CoV-2 viral copies this assay can detect is 138 copies/mL. A negative result does not preclude SARS-Cov-2 infection and should not be used as the sole basis for treatment or other patient management decisions. A negative result may occur with  improper specimen collection/handling, submission of specimen other than nasopharyngeal swab, presence of viral mutation(s) within the areas targeted by this assay, and inadequate number of viral copies(<138 copies/mL). A negative result must be combined with clinical observations, patient history, and epidemiological information. The expected result is Negative.  Fact Sheet for Patients:  BloggerCourse.com  Fact Sheet for Healthcare Providers:  SeriousBroker.it  This test is no t yet approved or cleared by the [place] FDA and  has been authorized for detection and/or diagnosis of SARS-CoV-2 by FDA under an Emergency Use Authorization (EUA). This EUA will remain  in effect (meaning this test can be used) for the duration of the COVID-19 declaration under Section 564(b)(1) of the Act, 21 U.S.C.section 360bbb-3(b)(1), unless the authorization is terminated  or revoked sooner.       Influenza A by PCR NEGATIVE NEGATIVE Final   Influenza B by PCR NEGATIVE NEGATIVE Final    Comment: (NOTE) The Xpert Xpress SARS-CoV-2/FLU/RSV plus assay is intended as an aid in the diagnosis of influenza from Nasopharyngeal swab specimens and should not be used as a sole basis for treatment. Nasal washings and aspirates are unacceptable for Xpert Xpress SARS-CoV-2/FLU/RSV testing.  Fact Sheet for Patients: BloggerCourse.com  Fact Sheet for Healthcare Providers: SeriousBroker.it  This test is not yet approved or cleared by the [place] FDA  and has been authorized for detection and/or diagnosis of SARS-CoV-2 by FDA under an Emergency Use Authorization (EUA). This EUA will remain in effect (meaning this test can be used) for the duration of the COVID-19 declaration under Section 564(b)(1) of the Act, 21 U.S.C. section 360bbb-3(b)(1), unless the authorization is terminated or revoked.  Performed at [facility], [address] [place], [place] [address]          Radiology Studies: MR ANGIO HEAD WO CONTRAST  Result Date: 07/15/2020 CLINICAL DATA:  Initial evaluation for neuro deficit, stroke suspected. EXAM: MRI HEAD WITHOUT CONTRAST MRA HEAD WITHOUT CONTRAST MRA NECK WITHOUT CONTRAST TECHNIQUE: Multiplanar, multiecho pulse sequences of the brain and surrounding structures were obtained without intravenous contrast. Angiographic images of the Circle of Willis were obtained using MRA technique without intravenous contrast. Angiographic images of the neck were obtained using MRA technique without intravenous contrast. Carotid stenosis measurements (when applicable) are obtained utilizing NASCET criteria, using the distal internal carotid diameter as the denominator. COMPARISON:  Prior CT from earlier the same day. FINDINGS: MRI HEAD FINDINGS Brain:  Generalized age-related cerebral atrophy. Patchy and confluent T2/FLAIR hyperintensity within the periventricular deep white matter both cerebral hemispheres most consistent with chronic small vessel ischemic disease, moderate in nature. Encephalomalacia and gliosis involving the left parietotemporal region consistent with a chronic left MCA distribution infarct. Mild petechial hemorrhage without frank hemorrhagic transformation. No significant regional mass effect. No other evidence for acute or subacute ischemia. Gray-white matter differentiation otherwise maintained. No other areas of chronic cortical infarction. No mass lesion, midline shift or mass effect. No hydrocephalus or  extra-axial fluid collection. Pituitary gland suprasellar region normal. Midline structures intact. Immediately anterior and adjacent to the chronic left MCA territory infarct, there are scattered areas of restricted diffusion involving the cortex of the left posterior frontotemporal region, consistent with an acute left MCA territory infarct (series 5, image 77). Associated Vascular: Hypoplastic left vertebral artery not well seen. Major intracranial vascular flow voids are otherwise maintained. Skull and upper cervical spine: Craniocervical junction within normal limits. Bone marrow signal intensity normal. No scalp soft tissue abnormality. Sinuses/Orbits: Globes and orbital soft tissues within normal limits. Paranasal sinuses are largely clear. No mastoid effusion. Inner ear structures grossly normal. Other: None. MRA HEAD FINDINGS ANTERIOR CIRCULATION: Visualized distal cervical segments of the internal carotid arteries are patent with antegrade flow. Petrous, cavernous, and supraclinoid segments patent without flow-limiting stenosis. A1 segments patent bilaterally. Normal anterior communicating artery complex. Anterior cerebral arteries patent to their distal aspects without stenosis. Right M1 segment widely patent. Normal right MCA bifurcation. Distal right MCA branches well perfused. Left M1 segment patent proximally. There are severe stenoses involving the distal left M1 segment, left MCA bifurcation, and adjacent proximal M2 branches (series 1049, image 6). There appears to be involvement of both superior and inferior divisions. Left MCA branches perfused distally, with no definite proximal M2 branch occlusion. POSTERIOR CIRCULATION: Right vertebral artery dominant and patent to the vertebrobasilar junction. Left vertebral artery markedly hypoplastic and not well visualized, possibly occluded. Right PICA origin patent. Focal moderate stenosis at the vertebrobasilar junction (series 1055, image 9). This is  positioned at the takeoff of a dominant left AICA. Extensive atheromatous irregularity within the left AICA. Basilar widely patent distally. Superior cerebral arteries patent bilaterally. Both PCAs supplied via a hypoplastic P1 segments and robust bilateral posterior communicating arteries. Diffuse atheromatous irregularity throughout the PCAs bilaterally without proximal high-grade stenosis. No intracranial aneurysm. MRA NECK FINDINGS AORTIC ARCH: Examination technically limited by motion and lack of IV contrast. Partially visualized aortic arch grossly within normal limits for caliber. Normal branch pattern. No obvious high-grade stenosis about the origin of the great vessels. RIGHT CAROTID SYSTEM: Partially visualized right CCA patent without stenosis. No significant atheromatous stenosis about the right carotid bulb or proximal right ICA. Right ICA patent distally without stenosis, evidence for dissection or occlusion. LEFT CAROTID SYSTEM: Partially visualized left CCA patent without appreciable stenosis. No significant atheromatous narrowing about the left carotid bulb or proximal left ICA. Left ICA patent distally without stenosis, evidence for dissection or occlusion. VERTEBRAL ARTERIES: Both vertebral arteries appear to arise from the subclavian arteries. Strongly dominant right vertebral artery patent within the neck without appreciable stenosis or other vascular abnormality. Left vertebral artery is markedly hypoplastic and not well seen, possibly at least partially occluded. IMPRESSION: MRI HEAD IMPRESSION: 1. Scattered acute ischemic cortical infarct involving the left frontotemporal region. Associated mild petechial hemorrhage without frank hemorrhagic transformation or significant mass effect. 2. Underlying chronic left MCA distribution infarct with moderate chronic microvascular ischemic disease. MRA HEAD IMPRESSION:  1. Negative MRA for large vessel occlusion. 2. Severe stenoses involving the distal  left M1 segment, left MCA bifurcation, and adjacent proximal M2 branches, with probable involvement of both superior and inferior divisions. 3. Moderate stenosis at the vertebrobasilar junction. 4. Nonvisualization of the left vertebral artery. MRA NECK IMPRESSION: 1. Technically limited exam by motion and lack of IV contrast. 2. Patency of both carotid artery systems within the neck without appreciable stenosis or other acute vascular abnormality. 3. Strongly dominant right vertebral artery patent within the neck. Hypoplastic left vertebral artery not well seen, and may be occluded/partially occluded. Electronically Signed   By: Rise Mu M.D.   On: 07/15/2020 19:54   MR ANGIO NECK WO CONTRAST  Result Date: 07/15/2020 CLINICAL DATA:  Initial evaluation for neuro deficit, stroke suspected. EXAM: MRI HEAD WITHOUT CONTRAST MRA HEAD WITHOUT CONTRAST MRA NECK WITHOUT CONTRAST TECHNIQUE: Multiplanar, multiecho pulse sequences of the brain and surrounding structures were obtained without intravenous contrast. Angiographic images of the Circle of Willis were obtained using MRA technique without intravenous contrast. Angiographic images of the neck were obtained using MRA technique without intravenous contrast. Carotid stenosis measurements (when applicable) are obtained utilizing NASCET criteria, using the distal internal carotid diameter as the denominator. COMPARISON:  Prior CT from earlier the same day. FINDINGS: MRI HEAD FINDINGS Brain: Generalized age-related cerebral atrophy. Patchy and confluent T2/FLAIR hyperintensity within the periventricular deep white matter both cerebral hemispheres most consistent with chronic small vessel ischemic disease, moderate in nature. Encephalomalacia and gliosis involving the left parietotemporal region consistent with a chronic left MCA distribution infarct. Mild petechial hemorrhage without frank hemorrhagic transformation. No significant regional mass effect. No  other evidence for acute or subacute ischemia. Gray-white matter differentiation otherwise maintained. No other areas of chronic cortical infarction. No mass lesion, midline shift or mass effect. No hydrocephalus or extra-axial fluid collection. Pituitary gland suprasellar region normal. Midline structures intact. Immediately anterior and adjacent to the chronic left MCA territory infarct, there are scattered areas of restricted diffusion involving the cortex of the left posterior frontotemporal region, consistent with an acute left MCA territory infarct (series 5, image 77). Associated Vascular: Hypoplastic left vertebral artery not well seen. Major intracranial vascular flow voids are otherwise maintained. Skull and upper cervical spine: Craniocervical junction within normal limits. Bone marrow signal intensity normal. No scalp soft tissue abnormality. Sinuses/Orbits: Globes and orbital soft tissues within normal limits. Paranasal sinuses are largely clear. No mastoid effusion. Inner ear structures grossly normal. Other: None. MRA HEAD FINDINGS ANTERIOR CIRCULATION: Visualized distal cervical segments of the internal carotid arteries are patent with antegrade flow. Petrous, cavernous, and supraclinoid segments patent without flow-limiting stenosis. A1 segments patent bilaterally. Normal anterior communicating artery complex. Anterior cerebral arteries patent to their distal aspects without stenosis. Right M1 segment widely patent. Normal right MCA bifurcation. Distal right MCA branches well perfused. Left M1 segment patent proximally. There are severe stenoses involving the distal left M1 segment, left MCA bifurcation, and adjacent proximal M2 branches (series 1049, image 6). There appears to be involvement of both superior and inferior divisions. Left MCA branches perfused distally, with no definite proximal M2 branch occlusion. POSTERIOR CIRCULATION: Right vertebral artery dominant and patent to the  vertebrobasilar junction. Left vertebral artery markedly hypoplastic and not well visualized, possibly occluded. Right PICA origin patent. Focal moderate stenosis at the vertebrobasilar junction (series 1055, image 9). This is positioned at the takeoff of a dominant left AICA. Extensive atheromatous irregularity within the left AICA. Basilar widely patent distally.  Superior cerebral arteries patent bilaterally. Both PCAs supplied via a hypoplastic P1 segments and robust bilateral posterior communicating arteries. Diffuse atheromatous irregularity throughout the PCAs bilaterally without proximal high-grade stenosis. No intracranial aneurysm. MRA NECK FINDINGS AORTIC ARCH: Examination technically limited by motion and lack of IV contrast. Partially visualized aortic arch grossly within normal limits for caliber. Normal branch pattern. No obvious high-grade stenosis about the origin of the great vessels. RIGHT CAROTID SYSTEM: Partially visualized right CCA patent without stenosis. No significant atheromatous stenosis about the right carotid bulb or proximal right ICA. Right ICA patent distally without stenosis, evidence for dissection or occlusion. LEFT CAROTID SYSTEM: Partially visualized left CCA patent without appreciable stenosis. No significant atheromatous narrowing about the left carotid bulb or proximal left ICA. Left ICA patent distally without stenosis, evidence for dissection or occlusion. VERTEBRAL ARTERIES: Both vertebral arteries appear to arise from the subclavian arteries. Strongly dominant right vertebral artery patent within the neck without appreciable stenosis or other vascular abnormality. Left vertebral artery is markedly hypoplastic and not well seen, possibly at least partially occluded. IMPRESSION: MRI HEAD IMPRESSION: 1. Scattered acute ischemic cortical infarct involving the left frontotemporal region. Associated mild petechial hemorrhage without frank hemorrhagic transformation or  significant mass effect. 2. Underlying chronic left MCA distribution infarct with moderate chronic microvascular ischemic disease. MRA HEAD IMPRESSION: 1. Negative MRA for large vessel occlusion. 2. Severe stenoses involving the distal left M1 segment, left MCA bifurcation, and adjacent proximal M2 branches, with probable involvement of both superior and inferior divisions. 3. Moderate stenosis at the vertebrobasilar junction. 4. Nonvisualization of the left vertebral artery. MRA NECK IMPRESSION: 1. Technically limited exam by motion and lack of IV contrast. 2. Patency of both carotid artery systems within the neck without appreciable stenosis or other acute vascular abnormality. 3. Strongly dominant right vertebral artery patent within the neck. Hypoplastic left vertebral artery not well seen, and may be occluded/partially occluded. Electronically Signed   By: Rise Mu M.D.   On: 07/15/2020 19:54   MR Brain Wo Contrast (neuro protocol)  Result Date: 07/15/2020 CLINICAL DATA:  Initial evaluation for neuro deficit, stroke suspected. EXAM: MRI HEAD WITHOUT CONTRAST MRA HEAD WITHOUT CONTRAST MRA NECK WITHOUT CONTRAST TECHNIQUE: Multiplanar, multiecho pulse sequences of the brain and surrounding structures were obtained without intravenous contrast. Angiographic images of the Circle of Willis were obtained using MRA technique without intravenous contrast. Angiographic images of the neck were obtained using MRA technique without intravenous contrast. Carotid stenosis measurements (when applicable) are obtained utilizing NASCET criteria, using the distal internal carotid diameter as the denominator. COMPARISON:  Prior CT from earlier the same day. FINDINGS: MRI HEAD FINDINGS Brain: Generalized age-related cerebral atrophy. Patchy and confluent T2/FLAIR hyperintensity within the periventricular deep white matter both cerebral hemispheres most consistent with chronic small vessel ischemic disease, moderate  in nature. Encephalomalacia and gliosis involving the left parietotemporal region consistent with a chronic left MCA distribution infarct. Mild petechial hemorrhage without frank hemorrhagic transformation. No significant regional mass effect. No other evidence for acute or subacute ischemia. Gray-white matter differentiation otherwise maintained. No other areas of chronic cortical infarction. No mass lesion, midline shift or mass effect. No hydrocephalus or extra-axial fluid collection. Pituitary gland suprasellar region normal. Midline structures intact. Immediately anterior and adjacent to the chronic left MCA territory infarct, there are scattered areas of restricted diffusion involving the cortex of the left posterior frontotemporal region, consistent with an acute left MCA territory infarct (series 5, image 77). Associated Vascular: Hypoplastic left vertebral artery  not well seen. Major intracranial vascular flow voids are otherwise maintained. Skull and upper cervical spine: Craniocervical junction within normal limits. Bone marrow signal intensity normal. No scalp soft tissue abnormality. Sinuses/Orbits: Globes and orbital soft tissues within normal limits. Paranasal sinuses are largely clear. No mastoid effusion. Inner ear structures grossly normal. Other: None. MRA HEAD FINDINGS ANTERIOR CIRCULATION: Visualized distal cervical segments of the internal carotid arteries are patent with antegrade flow. Petrous, cavernous, and supraclinoid segments patent without flow-limiting stenosis. A1 segments patent bilaterally. Normal anterior communicating artery complex. Anterior cerebral arteries patent to their distal aspects without stenosis. Right M1 segment widely patent. Normal right MCA bifurcation. Distal right MCA branches well perfused. Left M1 segment patent proximally. There are severe stenoses involving the distal left M1 segment, left MCA bifurcation, and adjacent proximal M2 branches (series 1049, image  6). There appears to be involvement of both superior and inferior divisions. Left MCA branches perfused distally, with no definite proximal M2 branch occlusion. POSTERIOR CIRCULATION: Right vertebral artery dominant and patent to the vertebrobasilar junction. Left vertebral artery markedly hypoplastic and not well visualized, possibly occluded. Right PICA origin patent. Focal moderate stenosis at the vertebrobasilar junction (series 1055, image 9). This is positioned at the takeoff of a dominant left AICA. Extensive atheromatous irregularity within the left AICA. Basilar widely patent distally. Superior cerebral arteries patent bilaterally. Both PCAs supplied via a hypoplastic P1 segments and robust bilateral posterior communicating arteries. Diffuse atheromatous irregularity throughout the PCAs bilaterally without proximal high-grade stenosis. No intracranial aneurysm. MRA NECK FINDINGS AORTIC ARCH: Examination technically limited by motion and lack of IV contrast. Partially visualized aortic arch grossly within normal limits for caliber. Normal branch pattern. No obvious high-grade stenosis about the origin of the great vessels. RIGHT CAROTID SYSTEM: Partially visualized right CCA patent without stenosis. No significant atheromatous stenosis about the right carotid bulb or proximal right ICA. Right ICA patent distally without stenosis, evidence for dissection or occlusion. LEFT CAROTID SYSTEM: Partially visualized left CCA patent without appreciable stenosis. No significant atheromatous narrowing about the left carotid bulb or proximal left ICA. Left ICA patent distally without stenosis, evidence for dissection or occlusion. VERTEBRAL ARTERIES: Both vertebral arteries appear to arise from the subclavian arteries. Strongly dominant right vertebral artery patent within the neck without appreciable stenosis or other vascular abnormality. Left vertebral artery is markedly hypoplastic and not well seen, possibly at  least partially occluded. IMPRESSION: MRI HEAD IMPRESSION: 1. Scattered acute ischemic cortical infarct involving the left frontotemporal region. Associated mild petechial hemorrhage without frank hemorrhagic transformation or significant mass effect. 2. Underlying chronic left MCA distribution infarct with moderate chronic microvascular ischemic disease. MRA HEAD IMPRESSION: 1. Negative MRA for large vessel occlusion. 2. Severe stenoses involving the distal left M1 segment, left MCA bifurcation, and adjacent proximal M2 branches, with probable involvement of both superior and inferior divisions. 3. Moderate stenosis at the vertebrobasilar junction. 4. Nonvisualization of the left vertebral artery. MRA NECK IMPRESSION: 1. Technically limited exam by motion and lack of IV contrast. 2. Patency of both carotid artery systems within the neck without appreciable stenosis or other acute vascular abnormality. 3. Strongly dominant right vertebral artery patent within the neck. Hypoplastic left vertebral artery not well seen, and may be occluded/partially occluded. Electronically Signed   By: Rise Mu M.D.   On: 07/15/2020 19:54   ECHOCARDIOGRAM COMPLETE BUBBLE STUDY  Result Date: 07/16/2020    ECHOCARDIOGRAM REPORT   Patient Name:   Kara Mendoza Date of Exam: 07/16/2020 Medical  Rec #:  [id number]      Height:       62.0 in Accession #:    [id number]     Weight:       152.6 lb Date of Birth:  Jan 03, 1951      BSA:          1.704 m Patient Age:    70 years       BP:           141/72 mmHg Patient Gender: F              HR:           75 bpm. Exam Location:  Inpatient Procedure: 2D Echo, Cardiac Doppler and Color Doppler Indications:    Stroke 434.91/I63.9  History:        Patient has prior history of Echocardiogram examinations, most                 recent 08/10/2013.  Sonographer:    Roosvelt Maserachel Lane RDCS Referring Phys: [id number] VISHAL R PATEL IMPRESSIONS  1. Left ventricular ejection fraction, by estimation, is 60 to  65%. The left ventricle has normal function. The left ventricle has no regional wall motion abnormalities. Left ventricular diastolic parameters are consistent with Grade I diastolic dysfunction (impaired relaxation).  2. Right ventricular systolic function is normal. The right ventricular size is normal.  3. The mitral valve is grossly normal. Trivial mitral valve regurgitation. No evidence of mitral stenosis.  4. The aortic valve is tricuspid. There is mild calcification of the aortic valve. There is mild thickening of the aortic valve. Aortic valve regurgitation is not visualized. Mild to moderate aortic valve sclerosis/calcification is present, without any evidence of aortic stenosis.  5. Agitated saline contrast bubble study was negative, with no evidence of any interatrial shunt. Comparison(s): Compared to prior echo report in 2015, there is no significant change. Conclusion(s)/Recommendation(s): No intracardiac source of embolism detected on this transthoracic study. A transesophageal echocardiogram is recommended to exclude cardiac source of embolism if clinically indicated. FINDINGS  Left Ventricle: Left ventricular ejection fraction, by estimation, is 60 to 65%. The left ventricle has normal function. The left ventricle has no regional wall motion abnormalities. The left ventricular internal cavity size was normal in size. There is  borderline concentric left ventricular hypertrophy. Left ventricular diastolic parameters are consistent with Grade I diastolic dysfunction (impaired relaxation). Right Ventricle: The right ventricular size is normal. No increase in right ventricular wall thickness. Right ventricular systolic function is normal. Left Atrium: Left atrial size was normal in size. Right Atrium: Right atrial size was normal in size. Pericardium: There is no evidence of pericardial effusion. Mitral Valve: The mitral valve is grossly normal. There is mild thickening of the mitral valve leaflet(s).  There is mild calcification of the mitral valve leaflet(s). Mild mitral annular calcification. Trivial mitral valve regurgitation. No evidence of mitral valve stenosis. Tricuspid Valve: The tricuspid valve is normal in structure. Tricuspid valve regurgitation is trivial. Aortic Valve: The aortic valve is tricuspid. There is mild calcification of the aortic valve. There is mild thickening of the aortic valve. Aortic valve regurgitation is not visualized. Mild to moderate aortic valve sclerosis/calcification is present, without any evidence of aortic stenosis. Aortic valve mean gradient measures 4.0 mmHg. Aortic valve peak gradient measures 7.2 mmHg. Aortic valve area, by VTI measures 2.30 cm. Pulmonic Valve: The pulmonic valve was normal in structure. Pulmonic valve regurgitation is trivial. Aorta: The aortic root and ascending aorta are  structurally normal, with no evidence of dilitation. IAS/Shunts: No atrial level shunt detected by color flow Doppler. Agitated saline contrast was given intravenously to evaluate for intracardiac shunting. Agitated saline contrast bubble study was negative, with no evidence of any interatrial shunt.  LEFT VENTRICLE PLAX 2D LVIDd:         3.40 cm  Diastology LVIDs:         2.20 cm  LV e' medial:    7.18 cm/s LV PW:         1.00 cm  LV E/e' medial:  10.3 LV IVS:        1.00 cm  LV e' lateral:   6.20 cm/s LVOT diam:     1.80 cm  LV E/e' lateral: 12.0 LV SV:         52 LV SV Index:   30 LVOT Area:     2.54 cm  RIGHT VENTRICLE RV Basal diam:  3.20 cm LEFT ATRIUM             Index       RIGHT ATRIUM          Index LA diam:        3.00 cm 1.76 cm/m  RA Area:     8.71 cm LA Vol (A2C):   34.3 ml 20.13 ml/m RA Volume:   15.40 ml 9.04 ml/m LA Vol (A4C):   20.0 ml 11.74 ml/m LA Biplane Vol: 27.7 ml 16.26 ml/m  AORTIC VALVE AV Area (Vmax):    1.85 cm AV Area (Vmean):   1.82 cm AV Area (VTI):     2.30 cm AV Vmax:           134.00 cm/s AV Vmean:          93.900 cm/s AV VTI:             0.226 m AV Peak Grad:      7.2 mmHg AV Mean Grad:      4.0 mmHg LVOT Vmax:         97.50 cm/s LVOT Vmean:        67.300 cm/s LVOT VTI:          0.204 m LVOT/AV VTI ratio: 0.90  AORTA Ao Root diam: 3.00 cm Ao Asc diam:  2.60 cm MITRAL VALVE MV Area (PHT): 3.79 cm    SHUNTS MV Decel Time: 200 msec    Systemic VTI:  0.20 m MV E velocity: 74.20 cm/s  Systemic Diam: 1.80 cm MV A velocity: 90.20 cm/s MV E/A ratio:  0.82 Laurance Flatten MD Electronically signed by Laurance Flatten MD Signature Date/Time: 07/16/2020/5:09:07 PM    Final         Scheduled Meds:  atorvastatin  40 mg Oral QHS   clopidogrel  75 mg Oral Daily   DULoxetine  60 mg Oral Daily   enoxaparin (LOVENOX) injection  40 mg Subcutaneous Q24H   gabapentin  300 mg Oral TID   Continuous Infusions:   LOS: 1 day       Kathlen Mody, MD [facility]   To contact the attending provider between 7A-7P or the covering provider during after hours 7P-7A, please log into the web site [email or web address] and access using universal [place] password for that web site. If you do not have the password, please call the hospital operator.  07/17/2020, 5:06 PM

## 2020-07-17 NOTE — Progress Notes (Addendum)
STROKE TEAM PROGRESS NOTE   INTERVAL HISTORY  No family at bedside. Afebrile. VSS stable.   Neurological exam: Aphasic. min dysarthria. Word finding difficulties.  FC x4. 4/5 strength throughout.     Vitals:   07/16/20 2342 07/17/20 0445 07/17/20 0729 07/17/20 1202  BP: 136/81 137/75 (!) 143/67 127/78  Pulse: 67 71 66 74  Resp: 16 20 16 16   Temp: 98.4 F (36.9 C) 98.8 F (37.1 C) 98.5 F (36.9 C) 98 F (36.7 C)  TempSrc: Oral Oral Oral Oral  SpO2: 100% 99% 100% 100%   CBC:  Recent Labs  Lab 07/15/20 1321 07/17/20 0232  WBC 10.2 8.5  NEUTROABS 8.1*  --   HGB 15.8* 14.3  HCT 48.3* 42.7  MCV 91.1 89.5  PLT 407* 364   Basic Metabolic Panel:  Recent Labs  Lab 07/15/20 1321 07/17/20 0232  NA 138 138  K 4.1 4.3  CL 102 105  CO2 26 28  GLUCOSE 152* 117*  BUN 8 12  CREATININE 0.76 0.73  CALCIUM 10.3 10.4*  MG  --  1.9  PHOS  --  3.1   Lipid Panel:  Recent Labs  Lab 07/16/20 0133  CHOL 172  TRIG 71  HDL 48  CHOLHDL 3.6  VLDL 14  LDLCALC 09/15/20*   HgbA1c: 6.4 Urine Drug Screen:  Recent Labs  Lab 07/15/20 2112  LABOPIA NONE DETECTED  COCAINSCRNUR NONE DETECTED  LABBENZ NONE DETECTED  AMPHETMU NONE DETECTED  THCU NONE DETECTED  LABBARB NONE DETECTED    Alcohol Level  Recent Labs  Lab 07/15/20 1321  ETH <10    IMAGING past 24 hours DG Chest 1 View  Result Date: 07/15/2020 MPRESSION: No active cardiopulmonary process. Possible left shoulder calcific tendinitis/bursitis.   DG Forearm Right  Result Date: 07/15/2020 IMPRESSION: No acute findings identified within the right upper arm or forearm. Mild degenerative changes.   DG Abdomen 1 View  Result Date: 07/15/2020 MPRESSION: No retained metallic foreign body identified. Loop recorder in the left chest wall is noted.   CT HEAD WO CONTRAST Result Date: 07/15/2020 IMPRESSION:  1. No definite acute intracranial findings. Stable old left parietal infarct. 2. Slight progression in ventricular  dilatation without secondary signs of obstructive hydrocephalus.  MR ANGIO Neck WO CONTRAST Result Date: 07/15/2020 IMPRESSION:  1. Technically limited exam by motion and lack of IV contrast.  2. Patency of both carotid artery systems within the neck without appreciable stenosis or other acute vascular abnormality.  3. Strongly dominant right vertebral artery patent within the neck. Hypoplastic left vertebral artery not well seen, and may be occluded/partially occluded.    Result Date: 07/15/2020 MRA HEAD IMPRESSION:  1. Negative MRA for large vessel occlusion.  2. Severe stenoses involving the distal left M1 segment, left MCA bifurcation, and adjacent proximal M2 branches, with probable involvement of both superior and inferior divisions.  3. Moderate stenosis at the vertebrobasilar junction. 4. Nonvisualization of the left vertebral artery.   MR Brain Wo Contrast (neuro protocol) Result Date: 07/15/2020 IMPRESSION: 1. Scattered acute ischemic cortical infarct involving the left frontotemporal region. Associated mild petechial hemorrhage without frank hemorrhagic transformation or significant mass effect.  2. Underlying chronic left MCA distribution infarct with moderate chronic microvascular ischemic disease.   DG Humerus Right  Result Date: 07/15/2020 IMPRESSION: No acute findings identified within the right upper arm or forearm. Mild degenerative changes.   PHYSICAL EXAM General: Laying comfortably in bed; in no acute distress. HENT: Normal oropharynx and mucosa. Normal external  appearance of ears and nose. Neck: Supple, no pain or tenderness CV: No JVD. No peripheral edema. Pulmonary: Symmetric Chest rise. Normal respiratory effort. Abdomen: Soft to touch, non-tender. Ext: No cyanosis, edema, or deformity  Skin: No rash. Normal palpation of skin.     Neurologic Examination  Mental status/Cognition: Alert, oriented to person, place, and time.  limited by  aphasia. Speech/language: dysarthric, able to name 3 out of 5 objects, unable to repeat full sentence. Cranial nerves:   CN II Pupils equal and reactive to light, no VF deficits   CN III,IV,VI EOM intact, no gaze preference or deviation, no nystagmus   CN V normal sensation in V1, V2, and V3 segments bilaterally   CN VII no asymmetry, no nasolabial fold flattening   CN VIII normal hearing to speech   CN IX & X normal palatal elevation, no uvular deviation   CN XI 5/5 head turn and 5/5 shoulder shrug bilaterally   CN XII midline tongue protrusion    Motor:  Muscle bulk: normal, tone normal RUE 4/5, LUE 4/5, RLE 4/5, LLE 4/5, no drift  Coordination/Complex Motor: - Finger to Nose intact BL - Heel to shin unable to comprehend how to do it. - Rapid alternating movement are normal - Gait: deferred  ASSESSMENT/PLAN [name] is a 70 y.o. female with history of anxiety, asthma, chronic headaches, diabetes, hyperlipidemia, hypertension, prior left MCA stroke with residual language impairment and known pAfib (noted on loop recorder in 2017) who presents with 2 day history of worsening speech and decreased ambulation.  MRI showed scattered acute L MCA stroke. MRA with severe LMCA distal M1 stenosis.    Stroke:  left scattered MCA stroke with severe left MCA distal M1 stenosis.  Code Stroke CT head no acute abnormality. Stable old left parietal infarct. MRI: Scattered acute ischemic cortical infarct involving the left frontotemporal region associated mild petechial hemorrhage MRA head/neck No LVO. Severe stenoses involving the distal left M1 segment, left MCA bifurcation, and adjacent proximal M2 branches 2D Echo EF 60-65%, grade I diastolic dysfunction, no evidence of interatrial shunt Loop recorder in place since 10/14/2013. Unable to obtain results from loop recorder (exceeds life time of the battery). atrial fibrillation noted on loop recorder in 2017.  Patient declined Xarelto at that  time.    Recommend 30 day heart monitor to evaluate for atrial fibrillation. I spoke with NP with Cardiology. She will place order for the monitor and contact patient as an outpatient.  LDL 110 HgbA1c 6.4 VTE prophylaxis - Lovenox sq Diet: heart healthy Clopidogrel prior to admission, continue clopidogrel 75 mg daily. Patient has an allergy to ASA. Therapy recommendations:  SNF Disposition:  pending Will signoff for now.  Please contact us for any further questions or concerns.   Prior Stroke History left MCA distribution infarct in 2008 with residual language impairment, hypertension, dyslipidemia, tobacco abuse, found to have again left parietal and frontal small embolic pattern strokes.  Did not receive tPA due to unclear last known well. Chronic left parietal infarct with new extension on 08/10/2013 work up negative and hypercoag labs were unremarkable. Likely cryptogenic stroke. Loop recorder negative for AF episodes. TCD emboli monitoring negative for MES. Placed on plavix and statin.   Essential ypertension Home meds:  hyzaar 100/25mg  daily Stable gradually normalize BP in 3 to 5 days Long-term BP goal normotensive  Hyperlipidemia Home meds: none LDL 110, goal < 70 Add atorvastatin 40mg  daily  Continue statin at discharge  Diabetes type  II controlled Home meds:  glucophage 500mg  daily HgbA1c 6.4, goal < 7.0 CBGs No results for input(s): GLUCAP in the last 72 hours.  SSI  Other Stroke Risk Factors Advanced Age >/= 75  Cigarette smoker. 3 pack year. advised to stop smoking Overweight, recommend weight loss, diet and exercise as appropriate  Hx stroke: left parietal stroke with residual language impairment and known proximal atrial fibrillation  Other Active Problems Anxiety/Major depressive disorder: cymbalta Asthma [facility] day # 1  Lissy Olivencia-Simmons, ACNP-BC Stroke NP  To contact Stroke Continuity provider, please refer to [facility]. After hours,  contact General Neurology

## 2020-07-18 DIAGNOSIS — I10 Essential (primary) hypertension: Secondary | ICD-10-CM

## 2020-07-18 LAB — HEMOGLOBIN A1C
Hgb A1c MFr Bld: 6.1 % — ABNORMAL HIGH (ref 4.8–5.6)
Mean Plasma Glucose: 128 mg/dL

## 2020-07-18 NOTE — Progress Notes (Signed)
Physical Therapy Treatment Patient Details Name: Kara Mendoza MRN: [id number] DOB: 1951/02/03 Today's Date: 07/18/2020    History of Present Illness 70 y/o female presented to ED on 6/10 with complaints of "not acting right for 2 days", decreased activity, decreased ambulation and appetite. CT head (-) for acute findings. MRI shows scattered acute ischemic cortical infarct involving L frontotemporal region (L MCA stroke). PMH: hx of CVA with residual speech change, HTN, HLD, GERD, asthma, depression/anxiety, chronic pain.    PT Comments    Pt progressing towards physical therapy goals. Grossly functioning at a min assist level this session with hand held assist only, no RW. Encouraged pt to use the [facility] or the bathroom with staff instead of the [facility]. Noted SNF was previous recommendation, however feel this pt would thrive in the fast paced environment of CIR. She is motivated to work with therapies and her overall tolerance for functional activity is good considering her deficits. It appears she has good family support available upon d/c. Recommend CIR consult to explore this option.     Follow Up Recommendations  CIR;Supervision/Assistance - 24 hour     Equipment Recommendations  None recommended by PT    Recommendations for Other Services       Precautions / Restrictions Precautions Precautions: Fall Precaution Comments: L wrist pain - awaiting surgery Restrictions Weight Bearing Restrictions: No    Mobility  Bed Mobility               General bed mobility comments: Pt was received sitting up in the recliner.    Transfers Overall transfer level: Needs assistance Equipment used: 1 person hand held assist Transfers: Sit to/from Stand Sit to Stand: Min guard         General transfer comment: min guard for safety. Increased time required  Ambulation/Gait Ambulation/Gait assistance: Min guard;Min assist Gait Distance (Feet): 200 Feet Assistive device: 1  person hand held assist Gait Pattern/deviations: Step-through pattern;Decreased stride length;Decreased stance time - right;Decreased weight shift to right;Wide base of support;Drifts right/left Gait velocity: decreased Gait velocity interpretation: <1.31 ft/sec, indicative of household ambulator General Gait Details: minA to recover for mild LOB. Short step through pattern with RLE lagging more than the L. Occasional LOB and drifiting L/R requiring min assist to recover.   Stairs Stairs: Yes Stairs assistance: Supervision Stair Management: Two rails;Step to pattern;Forwards Number of Stairs: 10 (2x 5 stairs) General stair comments: VC's for general safety. Close hands on guarding provided throughout stair training.   Wheelchair Mobility    Modified Rankin (Stroke Patients Only) Modified Rankin (Stroke Patients Only) Pre-Morbid Rankin Score: Moderately severe disability Modified Rankin: Moderately severe disability     Balance Overall balance assessment: Mild deficits observed, not formally tested                               Standardized Balance Assessment Standardized Balance Assessment : Dynamic Gait Index   Dynamic Gait Index Level Surface: Moderate Impairment Change in Gait Speed: Moderate Impairment Gait with Horizontal Head Turns: Moderate Impairment Gait with Vertical Head Turns: Moderate Impairment Gait and Pivot Turn: Mild Impairment Step Over Obstacle: Moderate Impairment Step Around Obstacles: Severe Impairment Steps: Moderate Impairment Total Score: 8      Cognition Arousal/Alertness: Awake/alert Behavior During Therapy: WFL for tasks assessed/performed Overall Cognitive Status: Difficult to assess  General Comments: A&Ox4 but needs increased time to respond and at times needs choices due to expressive difficulties. Following all simple and multi-step commands      Exercises      General  Comments        Pertinent Vitals/Pain Pain Assessment: Faces Faces Pain Scale: Hurts even more Pain Location: L wrist/hand Pain Descriptors / Indicators: Grimacing;Guarding Pain Intervention(s): Limited activity within patient's tolerance;Monitored during session;Repositioned    Home Living                      Prior Function            PT Goals (current goals can now be found in the care plan section) Acute Rehab PT Goals Patient Stated Goal: to eventually return home PT Goal Formulation: With patient Time For Goal Achievement: 07/30/20 Potential to Achieve Goals: Fair Progress towards PT goals: Progressing toward goals    Frequency    Min 4X/week      PT Plan Current plan remains appropriate    Co-evaluation              AM-PAC PT "6 Clicks" Mobility   Outcome Measure  Help needed turning from your back to your side while in a flat bed without using bedrails?: A Little Help needed moving from lying on your back to sitting on the side of a flat bed without using bedrails?: A Little Help needed moving to and from a bed to a chair (including a wheelchair)?: A Little Help needed standing up from a chair using your arms (e.g., wheelchair or bedside chair)?: A Little Help needed to walk in hospital room?: A Little Help needed climbing 3-5 steps with a railing? : A Little 6 Click Score: 18    End of Session Equipment Utilized During Treatment: Gait belt Activity Tolerance: Patient tolerated treatment well Patient left: in chair;with call bell/phone within reach;with chair alarm set Nurse Communication: Mobility status PT Visit Diagnosis: Unsteadiness on feet (R26.81);Muscle weakness (generalized) (M62.81);Other abnormalities of gait and mobility (R26.89);Difficulty in walking, not elsewhere classified (R26.2)     Time: 1194-1740 PT Time Calculation (min) (ACUTE ONLY): 26 min  Charges:  $Gait Training: 23-37 mins                     Conni Slipper, PT, DPT Acute Rehabilitation Services Pager: [phone] Office: [phone]    Marylynn Pearson 07/18/2020, 3:21 PM

## 2020-07-18 NOTE — Progress Notes (Signed)
[facility] PROGRESS NOTE    Progress Note  Kara HammockSheila L Mendoza  XLK:[id number] DOB: 1950/03/25 DOA: 07/15/2020 PCP: Kara Mendoza     Brief Narrative:   Kara Mendoza is an 70 y.o. female past medical history of essential hypertension, hyperlipidemia asthma depression presents with decreased appetite and ambulation CT scan of the head showed an old stable parietal infarct.  Awaiting skilled nursing facility placement.  Assessment/Plan:   Left scattered MCA stroke with severe left MCA distal M1 stenosis: MRI shows scattered acute cortical infarct involving the left frontal temporal region associated with mild petechial hemorrhage. Neurology was consulted and the plan is to continue aspirin and Plavix. MRI shows severe stenosis of the distal left M1 segment. Cardiology was consulted for 30-day event monitor to be placed in outpatient. Physical therapy evaluated the patient the recommended skilled nursing facility.  Essential hypertension: Continue current regimen blood pressure is fairly controlled.  Hyperlipidemia: Continue statin LDL was 110.  Diabetes mellitus type 2 non-insulin-dependent: Continue metformin A1c 6.4 well-controlled on outpatient.  DVT prophylaxis: lovenxo Family Communication:none Status is: Inpatient  Remains inpatient appropriate because:Hemodynamically unstable  Dispo: The patient is from: Home              Anticipated d/c is to: SNF              Patient currently is medically stable to d/c.   Difficult to place patient No        Code Status:     Code Status Orders  (From admission, onward)           Start     Ordered   07/15/20 1948  Full code  Continuous        07/15/20 1949           Code Status History     Date Active Date Inactive Code Status Order ID Comments User Context   08/15/2013 0116 08/22/2013 1533 Full Code [id number]  Kara Prudengiulli, Daniel J, Mendoza Inpatient   08/09/2013 1811 08/14/2013 1858 Full Code  [id number]  Kara Mendoza ED         IV Access:   Peripheral IV   Procedures and diagnostic studies:   ECHOCARDIOGRAM COMPLETE BUBBLE STUDY  Result Date: 07/16/2020    ECHOCARDIOGRAM REPORT   Patient Name:   Kara Mendoza Date of Exam: 07/16/2020 Medical Rec #:  [id number]      Height:       62.0 in Accession #:    [id number]     Weight:       152.6 lb Date of Birth:  1950/03/25      BSA:          1.704 m Patient Age:    70 years       BP:           141/72 mmHg Patient Gender: F              HR:           75 bpm. Exam Location:  Inpatient Procedure: 2D Echo, Cardiac Doppler and Color Doppler Indications:    Stroke 434.91/I63.9  History:        Patient has prior history of Echocardiogram examinations, most                 recent 08/10/2013.  Sonographer:    Kara Mendoza Referring Phys: [id number] Kara Mendoza IMPRESSIONS  1. Left ventricular ejection fraction, by estimation, is 60 to  65%. The left ventricle has normal function. The left ventricle has no regional wall motion abnormalities. Left ventricular diastolic parameters are consistent with Grade I diastolic dysfunction (impaired relaxation).  2. Right ventricular systolic function is normal. The right ventricular size is normal.  3. The mitral valve is grossly normal. Trivial mitral valve regurgitation. No evidence of mitral stenosis.  4. The aortic valve is tricuspid. There is mild calcification of the aortic valve. There is mild thickening of the aortic valve. Aortic valve regurgitation is not visualized. Mild to moderate aortic valve sclerosis/calcification is present, without any evidence of aortic stenosis.  5. Agitated saline contrast bubble study was negative, with no evidence of any interatrial shunt. Comparison(s): Compared to prior echo report in 2015, there is no significant change. Conclusion(s)/Recommendation(s): No intracardiac source of embolism detected on this transthoracic study. A transesophageal echocardiogram is  recommended to exclude cardiac source of embolism if clinically indicated. FINDINGS  Left Ventricle: Left ventricular ejection fraction, by estimation, is 60 to 65%. The left ventricle has normal function. The left ventricle has no regional wall motion abnormalities. The left ventricular internal cavity size was normal in size. There is  borderline concentric left ventricular hypertrophy. Left ventricular diastolic parameters are consistent with Grade I diastolic dysfunction (impaired relaxation). Right Ventricle: The right ventricular size is normal. No increase in right ventricular wall thickness. Right ventricular systolic function is normal. Left Atrium: Left atrial size was normal in size. Right Atrium: Right atrial size was normal in size. Pericardium: There is no evidence of pericardial effusion. Mitral Valve: The mitral valve is grossly normal. There is mild thickening of the mitral valve leaflet(s). There is mild calcification of the mitral valve leaflet(s). Mild mitral annular calcification. Trivial mitral valve regurgitation. No evidence of mitral valve stenosis. Tricuspid Valve: The tricuspid valve is normal in structure. Tricuspid valve regurgitation is trivial. Aortic Valve: The aortic valve is tricuspid. There is mild calcification of the aortic valve. There is mild thickening of the aortic valve. Aortic valve regurgitation is not visualized. Mild to moderate aortic valve sclerosis/calcification is present, without any evidence of aortic stenosis. Aortic valve mean gradient measures 4.0 mmHg. Aortic valve peak gradient measures 7.2 mmHg. Aortic valve area, by VTI measures 2.30 cm. Pulmonic Valve: The pulmonic valve was normal in structure. Pulmonic valve regurgitation is trivial. Aorta: The aortic root and ascending aorta are structurally normal, with no evidence of dilitation. IAS/Shunts: No atrial level shunt detected by color flow Doppler. Agitated saline contrast was given intravenously to  evaluate for intracardiac shunting. Agitated saline contrast bubble study was negative, with no evidence of any interatrial shunt.  LEFT VENTRICLE PLAX 2D LVIDd:         3.40 cm  Diastology LVIDs:         2.20 cm  LV e' medial:    7.18 cm/s LV PW:         1.00 cm  LV E/e' medial:  10.3 LV IVS:        1.00 cm  LV e' lateral:   6.20 cm/s LVOT diam:     1.80 cm  LV E/e' lateral: 12.0 LV SV:         52 LV SV Index:   30 LVOT Area:     2.54 cm  RIGHT VENTRICLE RV Basal diam:  3.20 cm LEFT ATRIUM             Index       RIGHT ATRIUM  Index LA diam:        3.00 cm 1.76 cm/m  RA Area:     8.71 cm LA Vol (A2C):   34.3 ml 20.13 ml/m RA Volume:   15.40 ml 9.04 ml/m LA Vol (A4C):   20.0 ml 11.74 ml/m LA Biplane Vol: 27.7 ml 16.26 ml/m  AORTIC VALVE AV Area (Vmax):    1.85 cm AV Area (Vmean):   1.82 cm AV Area (VTI):     2.30 cm AV Vmax:           134.00 cm/s AV Vmean:          93.900 cm/s AV VTI:            0.226 m AV Peak Grad:      7.2 mmHg AV Mean Grad:      4.0 mmHg LVOT Vmax:         97.50 cm/s LVOT Vmean:        67.300 cm/s LVOT VTI:          0.204 m LVOT/AV VTI ratio: 0.90  AORTA Ao Root diam: 3.00 cm Ao Asc diam:  2.60 cm MITRAL VALVE MV Area (PHT): 3.79 cm    SHUNTS MV Decel Time: 200 msec    Systemic VTI:  0.20 m MV E velocity: 74.20 cm/s  Systemic Diam: 1.80 cm MV A velocity: 90.20 cm/s MV E/A ratio:  0.82 Laurance Flatten Mendoza Electronically signed by Laurance Flatten Mendoza Signature Date/Time: 07/16/2020/5:09:07 PM    Final      Medical Consultants:   None.   Subjective:    Kara Mendoza she relates she feels okay, relates that her shoulder which she has been having trouble for a long time is bothering her this morning.  Objective:    Vitals:   07/17/20 1646 07/17/20 1944 07/17/20 2322 07/18/20 0312  BP: (!) 144/77 (!) 145/79 (!) 148/65 127/68  Pulse: 66 88 75 65  Resp: 16 16 18 20   Temp: 99 F (37.2 C) 98 F (36.7 C) 98 F (36.7 C) 98.1 F (36.7 C)  TempSrc: Oral Oral Oral  Oral  SpO2: 98% 99% 99% 99%   SpO2: 99 %   Intake/Output Summary (Last 24 hours) at 07/18/2020 0701 Last data filed at 07/17/2020 1730 Gross per 24 hour  Intake 600 ml  Output 170 ml  Net 430 ml   There were no vitals filed for this visit.  Exam: General exam: In no acute distress. Respiratory system: Good air movement and clear to auscultation. Cardiovascular system: S1 & S2 heard, RRR. No JVD. Gastrointestinal system: Abdomen is nondistended, soft and nontender.  Extremities: Right shoulder is not swollen tender to touch or erythematous. Skin: No rashes, lesions or ulcers Psychiatry: Judgement and insight appear normal. Mood & affect appropriate.    Data Reviewed:    Labs: Basic Metabolic Panel: Recent Labs  Lab 07/15/20 1321 07/17/20 0232  NA 138 138  K 4.1 4.3  CL 102 105  CO2 26 28  GLUCOSE 152* 117*  BUN 8 12  CREATININE 0.76 0.73  CALCIUM 10.3 10.4*  MG  --  1.9  PHOS  --  3.1   GFR CrCl cannot be calculated (Unknown ideal weight.). Liver Function Tests: Recent Labs  Lab 07/15/20 1321  AST 25  ALT 31  ALKPHOS 71  BILITOT 1.0  PROT 6.8  ALBUMIN 3.9   No results for input(s): LIPASE, AMYLASE in the last 168 hours. No results for input(s): AMMONIA in the  last 168 hours. Coagulation profile Recent Labs  Lab 07/15/20 1321  INR 1.0   COVID-19 Labs  No results for input(s): DDIMER, FERRITIN, LDH, CRP in the last 72 hours.  Lab Results  Component Value Date   SARSCOV2NAA NEGATIVE 07/15/2020    CBC: Recent Labs  Lab 07/15/20 1321 07/17/20 0232  WBC 10.2 8.5  NEUTROABS 8.1*  --   HGB 15.8* 14.3  HCT 48.3* 42.7  MCV 91.1 89.5  PLT 407* 364   Cardiac Enzymes: No results for input(s): CKTOTAL, CKMB, CKMBINDEX, TROPONINI in the last 168 hours. BNP (last 3 results) No results for input(s): PROBNP in the last 8760 hours. CBG: No results for input(s): GLUCAP in the last 168 hours. D-Dimer: No results for input(s): DDIMER in the last 72  hours. Hgb A1c: No results for input(s): HGBA1C in the last 72 hours. Lipid Profile: Recent Labs    07/16/20 0133  CHOL 172  HDL 48  LDLCALC 110*  TRIG 71  CHOLHDL 3.6   Thyroid function studies: No results for input(s): TSH, T4TOTAL, T3FREE, THYROIDAB in the last 72 hours.  Invalid input(s): FREET3 Anemia work up: No results for input(s): VITAMINB12, FOLATE, FERRITIN, TIBC, IRON, RETICCTPCT in the last 72 hours. Sepsis Labs: Recent Labs  Lab 07/15/20 1321 07/17/20 0232  WBC 10.2 8.5   Microbiology Recent Results (from the past 240 hour(s))  Resp Panel by RT-PCR (Flu A&B, Covid) Nasopharyngeal Swab     Status: None   Collection Time: 07/15/20  1:21 PM   Specimen: Nasopharyngeal Swab; Nasopharyngeal(NP) swabs in vial transport medium  Result Value Ref Range Status   SARS Coronavirus 2 by RT PCR NEGATIVE NEGATIVE Final    Comment: (NOTE) SARS-CoV-2 target nucleic acids are NOT DETECTED.  The SARS-CoV-2 RNA is generally detectable in upper respiratory specimens during the acute phase of infection. The lowest concentration of SARS-CoV-2 viral copies this assay can detect is 138 copies/mL. A negative result does not preclude SARS-Cov-2 infection and should not be used as the sole basis for treatment or other patient management decisions. A negative result may occur with  improper specimen collection/handling, submission of specimen other than nasopharyngeal swab, presence of viral mutation(s) within the areas targeted by this assay, and inadequate number of viral copies(<138 copies/mL). A negative result must be combined with clinical observations, patient history, and epidemiological information. The expected result is Negative.  Fact Sheet for Patients:  BloggerCourse.com  Fact Sheet for Healthcare Providers:  SeriousBroker.it  This test is no t yet approved or cleared by the [place] FDA and  has been  authorized for detection and/or diagnosis of SARS-CoV-2 by FDA under an Emergency Use Authorization (EUA). This EUA will remain  in effect (meaning this test can be used) for the duration of the COVID-19 declaration under Section 564(b)(1) of the Act, 21 U.S.C.section 360bbb-3(b)(1), unless the authorization is terminated  or revoked sooner.       Influenza A by PCR NEGATIVE NEGATIVE Final   Influenza B by PCR NEGATIVE NEGATIVE Final    Comment: (NOTE) The Xpert Xpress SARS-CoV-2/FLU/RSV plus assay is intended as an aid in the diagnosis of influenza from Nasopharyngeal swab specimens and should not be used as a sole basis for treatment. Nasal washings and aspirates are unacceptable for Xpert Xpress SARS-CoV-2/FLU/RSV testing.  Fact Sheet for Patients: BloggerCourse.com  Fact Sheet for Healthcare Providers: SeriousBroker.it  This test is not yet approved or cleared by the [place] FDA and has been authorized for detection and/or  diagnosis of SARS-CoV-2 by FDA under an Emergency Use Authorization (EUA). This EUA will remain in effect (meaning this test can be used) for the duration of the COVID-19 declaration under Section 564(b)(1) of the Act, 21 U.S.C. section 360bbb-3(b)(1), unless the authorization is terminated or revoked.  Performed at [facility], [address], [place], [place] [address]      Medications:    atorvastatin  40 mg Oral QHS   clopidogrel  75 mg Oral Daily   DULoxetine  60 mg Oral Daily   enoxaparin (LOVENOX) injection  40 mg Subcutaneous Q24H   gabapentin  300 mg Oral TID   Continuous Infusions:    LOS: 2 days   Marinda Elk  [facility]  07/18/2020, 7:01 AM

## 2020-07-18 NOTE — Progress Notes (Signed)
[employer] SNF auth: [id number], approved for 07/18/2020 - 07/20/2020 with next review on 6/15.

## 2020-07-18 NOTE — Progress Notes (Signed)
SNF auth initiated via [employer] portal.  Initial clinical submitted.

## 2020-07-18 NOTE — Progress Notes (Signed)
Inpatient Rehab Admissions Coordinator Note:   Per therapy recommendations, pt was screened for CIR candidacy by Megan Salon, MS CCC-SLP. At this time,Pt. Is ambulating 200 ft with min g/minA and has a SNF bed offer I will not pursue CIR for this Pt.   Megan Salon, MS, CCC-SLP Rehab Admissions Coordinator  [phone] (celll) [phone] (office)

## 2020-07-18 NOTE — TOC Progression Note (Signed)
Transition of Care [facility]) - Progression Note    Patient Details  Name: TONISHIA STEFFY MRN: [id number] Date of Birth: [birth year]  Transition of Care [facility]) CM/SW [place], [place] Phone Number: 07/18/2020, 3:41 PM  Clinical Narrative:   CSW met with patient to provide bed offers. Patient reviewed options available, and said [facility] will be her top choice with [facility] as second. CSW asked patient about contacting family, and patient declined and said that she will make her own decisions. CSW reached out to [facility] and confirmed bed availability.   CSW was later stopped by patient's caregiver in the hallway that the daughter called to review options available and did not want the patient to go to [facility], asked to speak to CSW. CSW confirmed that she can call the daughter. CSW spoke with daughter, Theadora Rama, and discussed options. Brandy chose [facility]. CSW confirmed quarantine bed available (patient has not received vaccines) and started insurance authorization. Authorization received, bed available for tomorrow if patient is medically stable. CSW to follow.    Expected Discharge Plan: [place] Barriers to Discharge: Continued Medical Work up  Expected Discharge Plan and Services Expected Discharge Plan: [place] Choice: [place] arrangements for the past 2 months: Single Family Home                                       Social Determinants of Health (SDOH) Interventions    Readmission Risk Interventions No flowsheet data found.

## 2020-07-19 DIAGNOSIS — Z959 Presence of cardiac and vascular implant and graft, unspecified: Secondary | ICD-10-CM | POA: Diagnosis not present

## 2020-07-19 DIAGNOSIS — I82722 Chronic embolism and thrombosis of deep veins of left upper extremity: Secondary | ICD-10-CM | POA: Diagnosis not present

## 2020-07-19 DIAGNOSIS — F32A Depression, unspecified: Secondary | ICD-10-CM | POA: Diagnosis not present

## 2020-07-19 DIAGNOSIS — M25562 Pain in left knee: Secondary | ICD-10-CM | POA: Diagnosis not present

## 2020-07-19 DIAGNOSIS — J45909 Unspecified asthma, uncomplicated: Secondary | ICD-10-CM | POA: Diagnosis not present

## 2020-07-19 DIAGNOSIS — R531 Weakness: Secondary | ICD-10-CM | POA: Diagnosis not present

## 2020-07-19 DIAGNOSIS — I480 Paroxysmal atrial fibrillation: Secondary | ICD-10-CM | POA: Diagnosis not present

## 2020-07-19 DIAGNOSIS — R279 Unspecified lack of coordination: Secondary | ICD-10-CM | POA: Diagnosis not present

## 2020-07-19 DIAGNOSIS — I69319 Unspecified symptoms and signs involving cognitive functions following cerebral infarction: Secondary | ICD-10-CM | POA: Diagnosis not present

## 2020-07-19 DIAGNOSIS — I69320 Aphasia following cerebral infarction: Secondary | ICD-10-CM | POA: Diagnosis not present

## 2020-07-19 DIAGNOSIS — G459 Transient cerebral ischemic attack, unspecified: Secondary | ICD-10-CM | POA: Diagnosis not present

## 2020-07-19 DIAGNOSIS — M6281 Muscle weakness (generalized): Secondary | ICD-10-CM | POA: Diagnosis not present

## 2020-07-19 DIAGNOSIS — G8929 Other chronic pain: Secondary | ICD-10-CM | POA: Diagnosis not present

## 2020-07-19 DIAGNOSIS — I69354 Hemiplegia and hemiparesis following cerebral infarction affecting left non-dominant side: Secondary | ICD-10-CM | POA: Diagnosis not present

## 2020-07-19 DIAGNOSIS — M5030 Other cervical disc degeneration, unspecified cervical region: Secondary | ICD-10-CM | POA: Diagnosis not present

## 2020-07-19 DIAGNOSIS — E119 Type 2 diabetes mellitus without complications: Secondary | ICD-10-CM | POA: Diagnosis not present

## 2020-07-19 DIAGNOSIS — Z743 Need for continuous supervision: Secondary | ICD-10-CM | POA: Diagnosis not present

## 2020-07-19 DIAGNOSIS — E785 Hyperlipidemia, unspecified: Secondary | ICD-10-CM | POA: Diagnosis not present

## 2020-07-19 DIAGNOSIS — I639 Cerebral infarction, unspecified: Secondary | ICD-10-CM | POA: Diagnosis not present

## 2020-07-19 DIAGNOSIS — I10 Essential (primary) hypertension: Secondary | ICD-10-CM | POA: Diagnosis not present

## 2020-07-19 DIAGNOSIS — K219 Gastro-esophageal reflux disease without esophagitis: Secondary | ICD-10-CM | POA: Diagnosis not present

## 2020-07-19 DIAGNOSIS — Z7401 Bed confinement status: Secondary | ICD-10-CM | POA: Diagnosis not present

## 2020-07-19 LAB — SARS CORONAVIRUS 2 (TAT 6-24 HRS): SARS Coronavirus 2: NEGATIVE

## 2020-07-19 MED ORDER — ASPIRIN EC 81 MG PO TBEC: 81.0000 mg | DELAYED_RELEASE_TABLET | Freq: Every day | ORAL | 11 refills | Status: DC

## 2020-07-19 NOTE — Progress Notes (Signed)
Physical Therapy Treatment Patient Details Name: Kara Mendoza MRN: [id number] DOB: 04/19/1950 Today's Date: 07/19/2020    History of Present Illness 70 y/o female presented to ED on 6/10 with complaints of "not acting right for 2 days", decreased activity, decreased ambulation and appetite. CT head (-) for acute findings. MRI shows scattered acute ischemic cortical infarct involving L frontotemporal region (L MCA stroke). PMH: hx of CVA with residual speech change, HTN, HLD, GERD, asthma, depression/anxiety, chronic pain.    PT Comments    Pt received in bed and expresses that she wants to be able to get up to go to the bathroom by herself. Therapy has been working on independence with walking with minimal support but end of session today gave her RW and she was able to mobilize safely within room, approved by RN to go to bathroom with use of RW. Pt ambulated 500' wit no AD and SPC working on appropriate cane placement for increased stability especially during changes in direction and head position. PT will continue to follow.    Follow Up Recommendations  Supervision/Assistance - 24 hour;SNF     Equipment Recommendations  None recommended by PT    Recommendations for Other Services       Precautions / Restrictions Precautions Precautions: Fall Precaution Comments: L wrist pain - awaiting surgery Restrictions Weight Bearing Restrictions: No    Mobility  Bed Mobility Overal bed mobility: Needs Assistance Bed Mobility: Supine to Sit     Supine to sit: Supervision     General bed mobility comments: pt did not think she could get up from a flat bed without rail independently but with vc's for sequencing was able to do so with increased time    Transfers Overall transfer level: Needs assistance Equipment used: None;Straight cane Transfers: Sit to/from Stand Sit to Stand: Supervision;Min guard         General transfer comment: min-guard without AD, supervision with cane  and RW  Ambulation/Gait Ambulation/Gait assistance: Supervision Gait Distance (Feet): 500 Feet Assistive device: Straight cane;Rolling walker (2 wheeled) Gait Pattern/deviations: Step-through pattern;Decreased stride length;Decreased stance time - right;Decreased weight shift to right;Wide base of support;Drifts right/left Gait velocity: decreased Gait velocity interpretation: 1.31 - 2.62 ft/sec, indicative of limited community ambulator General Gait Details: with SPC pt did not have LOB and was much more steady than with no AD. Pt tends to take very small, quick steps and relays fear of falling. Cued to take slower, longer steps and she was able to do this as long as attending to it. Pt stable with RW in room and RN agreed to let her get up to bathroom with it   Stairs Stairs: Yes Stairs assistance: Supervision Stair Management: Two rails;Step to pattern;Forwards Number of Stairs: 10 General stair comments: supervision for safety   Wheelchair Mobility    Modified Rankin (Stroke Patients Only) Modified Rankin (Stroke Patients Only) Pre-Morbid Rankin Score: Moderately severe disability Modified Rankin: Moderately severe disability     Balance Overall balance assessment: Mild deficits observed, not formally tested                                          Cognition Arousal/Alertness: Awake/alert Behavior During Therapy: WFL for tasks assessed/performed Overall Cognitive Status: Difficult to assess  General Comments: A&Ox4 but needs increased time to respond and at times needs choices due to expressive difficulties. Following all simple and multi-step commands      Exercises      General Comments General comments (skin integrity, edema, etc.): worked on head turning and up/down head while ambulating and positioning cane for increased stability while doing this.      Pertinent Vitals/Pain Pain Assessment:  Faces Faces Pain Scale: Hurts even more Pain Location: B hands/ wrists Pain Descriptors / Indicators: Grimacing;Guarding Pain Intervention(s): Limited activity within patient's tolerance;Monitored during session    Home Living                      Prior Function            PT Goals (current goals can now be found in the care plan section) Acute Rehab PT Goals Patient Stated Goal: to eventually return home PT Goal Formulation: With patient Time For Goal Achievement: 07/30/20 Potential to Achieve Goals: Fair Progress towards PT goals: Progressing toward goals    Frequency    Min 3X/week      PT Plan Discharge plan needs to be updated;Frequency needs to be updated    Co-evaluation              AM-PAC PT "6 Clicks" Mobility   Outcome Measure  Help needed turning from your back to your side while in a flat bed without using bedrails?: A Little Help needed moving from lying on your back to sitting on the side of a flat bed without using bedrails?: A Little Help needed moving to and from a bed to a chair (including a wheelchair)?: A Little Help needed standing up from a chair using your arms (e.g., wheelchair or bedside chair)?: A Little Help needed to walk in hospital room?: A Little Help needed climbing 3-5 steps with a railing? : A Little 6 Click Score: 18    End of Session Equipment Utilized During Treatment: Gait belt Activity Tolerance: Patient tolerated treatment well Patient left: with call bell/phone within reach;in bed Nurse Communication: Mobility status;Other (comment) (bathroom with RW) PT Visit Diagnosis: Unsteadiness on feet (R26.81);Muscle weakness (generalized) (M62.81);Other abnormalities of gait and mobility (R26.89);Difficulty in walking, not elsewhere classified (R26.2)     Time: 6378-5885 PT Time Calculation (min) (ACUTE ONLY): 42 min  Charges:  $Gait Training: 23-37 mins $Therapeutic Activity: 8-22 mins                      Lyanne Co, PT  Acute Rehab Services  Pager [phone] Office [phone]    Lawana Chambers Dionne Knoop 07/19/2020, 12:26 PM

## 2020-07-19 NOTE — Progress Notes (Signed)
Pt has been discharged from the unit via [facility]. All belongings has been sent with the patient.

## 2020-07-19 NOTE — TOC Transition Note (Signed)
Transition of Care [facility]) - CM/SW Discharge Note   Patient Details  Name: Kara Mendoza MRN: [id number] Date of Birth: Oct 19, 1950  [facility] CM/SW Contact:  Baldemar Lenis, LCSW Phone Number: 07/19/2020, 10:36 AM   Clinical Narrative:   Nurse to call report to [phone], Room 113    Final next level of care: Skilled Nursing Facility Barriers to Discharge: Barriers Resolved   Patient Goals and CMS Choice Patient states their goals for this hospitalization and ongoing recovery are:: Pt and daughter agreeable to SNF placement. CMS Medicare.gov Compare Post Acute Care list provided to:: Patient Choice offered to / list presented to : Patient, Adult Children  Discharge Placement              Patient [facility] bed at: [facility] Patient to be transferred to facility by: [facility] Name of family member notified: Self, Brandy Patient and family notified of of transfer: 07/19/20  Discharge Plan and Services     Post Acute Care Choice: Skilled Nursing Facility                               Social Determinants of Health (SDOH) Interventions     Readmission Risk Interventions No flowsheet data found.

## 2020-07-19 NOTE — Care Management Important Message (Signed)
Important Message  Patient Details  Name: Kara Mendoza MRN: [id number] Date of Birth: 06-Aug-1950   Medicare Important Message Given:  Yes     [place] Dann Stefan Church 07/19/2020, 2:38 PM

## 2020-07-19 NOTE — Discharge Summary (Signed)
Physician Discharge Summary  Kara Mendoza ZOX:[id number] DOB: Nov 01, 1950 DOA: 07/15/2020  PCP: Olive Bass, FNP  Admit date: 07/15/2020 Discharge date: 07/19/2020  Admitted From: Home Disposition:  SNF  Recommendations for Outpatient Follow-up:  Follow up with Cardiologyin 1-2 weeks Please obtain BMP/CBC in one week   Home Health:No Equipment/Devices:none  Discharge Condition:Stable CODE STATUS:Full Diet recommendation: Heart Healthy   Brief/Interim Summary: 70 y.o. female past medical history of essential hypertension, hyperlipidemia asthma depression presents with decreased appetite and ambulation CT scan of the head showed an old stable parietal infarct.    Discharge Diagnoses:  Principal Problem:   Acute CVA (cerebrovascular accident) [facility]) Active Problems:   Stroke [facility])   Hyperlipidemia   Essential hypertension  Left scattered MCA stroke with severe left MCA distal M1 stenosis: MRI of the brain showed acute cortical infarct involving the left frontal temporal region. Neurology was consulted recommended to continue Plavix and a 30-day outpatient Holter monitor to monitor for paroxysmal atrial fibrillation. She has been referred to cardiology for 30-day Holter monitor placement as an outpatient. Evaluated the patient and recommended skilled nursing facility.  Essential hypertension: Stable continue current regimen.  Hyperlipidemia: Continue statin LDL was 110.  Diabetes mellitus type 2: With an A1c of 6.4 no changes made to her medication.  Discharge Instructions  Discharge Instructions     Diet - low sodium heart healthy   Complete by: As directed    Increase activity slowly   Complete by: As directed       Allergies as of 07/19/2020       Reactions   Aspirin Hives, Itching, Other (See Comments)   Diclofenac Shortness Of Breath   Only with po diclofenac   Penicillins Hives   Has patient had a PCN reaction causing immediate rash,  facial/tongue/throat swelling, SOB or lightheadedness with hypotension: Yes Has patient had a PCN reaction causing severe rash involving mucus membranes or skin necrosis: No Has patient had a PCN reaction that required hospitalization No Has patient had a PCN reaction occurring within the last 10 years: No If all of the above answers are "NO", then may proceed with Cephalosporin use.        Medication List     TAKE these medications    acetaminophen 500 MG tablet Commonly known as: TYLENOL Take 2 tablets (1,000 mg total) by mouth every 6 (six) hours as needed. What changed: reasons to take this   albuterol 108 (90 Base) MCG/ACT inhaler Commonly known as: VENTOLIN HFA Inhale 2 puffs into the lungs every 6 (six) hours as needed for wheezing or shortness of breath.   atorvastatin 40 MG tablet Commonly known as: LIPITOR TAKE 1 TABLET BY MOUTH DAILY AT 6 PM What changed: when to take this   BLOOD GLUCOSE TEST STRIPS Strp Inject 1 Units into the skin 4 (four) times daily -  before meals and at bedtime. OneTouch Ultra mini strips   clopidogrel 75 MG tablet Commonly known as: PLAVIX TAKE 1 TABLET(75 MG) BY MOUTH DAILY What changed: See the new instructions.   diclofenac Sodium 1 % Gel Commonly known as: VOLTAREN Apply 2 g topically 4 (four) times daily. What changed:  when to take this reasons to take this   DULoxetine 60 MG capsule Commonly known as: CYMBALTA TAKE 1 CAPSULE(60 MG) BY MOUTH DAILY What changed: See the new instructions.   fluticasone 50 MCG/ACT nasal spray Commonly known as: FLONASE Place 1 spray into both nostrils daily. What changed:  when to  take this reasons to take this   gabapentin 300 MG capsule Commonly known as: NEURONTIN TAKE 1 CAPSULE(300 MG) BY MOUTH THREE TIMES DAILY What changed: See the new instructions.   losartan-hydrochlorothiazide 100-25 MG tablet Commonly known as: HYZAAR TAKE 1 TABLET BY MOUTH DAILY   metFORMIN 500 MG  tablet Commonly known as: GLUCOPHAGE TAKE 1 TABLET(500 MG) BY MOUTH DAILY WITH BREAKFAST What changed: See the new instructions.   multivitamin with minerals Tabs tablet Take 1 tablet by mouth daily.   omeprazole 20 MG capsule Commonly known as: PRILOSEC TAKE 1 CAPSULE(20 MG) BY MOUTH TWICE DAILY What changed: See the new instructions.   OneTouch Delica Lancets Fine Misc 1 Units by Does not apply route 4 (four) times daily -  before meals and at bedtime.   oxyCODONE-acetaminophen 10-325 MG tablet Commonly known as: PERCOCET Take 1 tablet by mouth every 6 (six) hours as needed for pain.   potassium chloride 10 MEQ tablet Commonly known as: KLOR-CON Take 1 tablet (10 mEq total) by mouth daily.   topiramate 50 MG tablet Commonly known as: TOPAMAX Take 50 mg by mouth 2 (two) times daily as needed (migraines).   triamcinolone cream 0.1 % Commonly known as: KENALOG Apply 1 application topically 2 (two) times daily. What changed:  when to take this reasons to take this   zolpidem 5 MG tablet Commonly known as: AMBIEN TAKE 1 TABLET(5 MG) BY MOUTH AT BEDTIME AS NEEDED FOR SLEEP What changed: See the new instructions.        Allergies  Allergen Reactions   Aspirin Hives, Itching and Other (See Comments)   Diclofenac Shortness Of Breath    Only with po diclofenac   Penicillins Hives    Has patient had a PCN reaction causing immediate rash, facial/tongue/throat swelling, SOB or lightheadedness with hypotension: Yes Has patient had a PCN reaction causing severe rash involving mucus membranes or skin necrosis: No Has patient had a PCN reaction that required hospitalization No Has patient had a PCN reaction occurring within the last 10 years: No If all of the above answers are "NO", then may proceed with Cephalosporin use.     Consultations: Neurology   Procedures/Studies: DG Chest 1 View  Result Date: 07/15/2020 CLINICAL DATA:  Altered mental status. EXAM: CHEST  1  VIEW COMPARISON:  Radiographs 10/23/2017. FINDINGS: 1350 hours. Lower lung volumes. The heart size and mediastinal contours are stable. The lungs appear clear. There is no pleural effusion or pneumothorax. Loop recorder overlies the left lower chest. There are mild degenerative changes at both shoulders with a calcification adjacent to the left greater tuberosity which could reflect hydroxyapatite deposition. No acute osseous findings. Telemetry leads overlie the chest. IMPRESSION: No active cardiopulmonary process. Possible left shoulder calcific tendinitis/bursitis. Electronically Signed   By: Carey Bullocks M.D.   On: 07/15/2020 14:18   DG Forearm Right  Result Date: 07/15/2020 CLINICAL DATA:  Altered mental status.  History of stroke. EXAM: RIGHT FOREARM - 2 VIEW; RIGHT HUMERUS - 2+ VIEW COMPARISON:  None. FINDINGS: No evidence of acute fracture or dislocation. There are mild degenerative changes at the shoulder and elbow. No significant elbow joint effusion. Ulnar minus variance noted at the wrist. There is an IV in the antecubital fossa. No focal soft tissue abnormality or foreign body identified. IMPRESSION: No acute findings identified within the right upper arm or forearm. Mild degenerative changes. Electronically Signed   By: Carey Bullocks M.D.   On: 07/15/2020 14:20   DG Abdomen  1 View  Result Date: 07/15/2020 CLINICAL DATA:  Rule out metallic foreign body prior to MRI EXAM: ABDOMEN - 1 VIEW COMPARISON:  None. FINDINGS: Loop recorder is noted in the left anterior chest wall. Scattered large and small bowel gas is noted. No retained metallic foreign body is identified. Degenerative changes of lumbar spine are noted. IMPRESSION: No retained metallic foreign body identified. Loop recorder in the left chest wall is noted. Electronically Signed   By: Alcide Clever M.D.   On: 07/15/2020 17:27   CT HEAD WO CONTRAST  Result Date: 07/15/2020 CLINICAL DATA:  Neuro deficit, acute stroke suspected.  Mental status change. Dysphagia for 2 days. EXAM: CT HEAD WITHOUT CONTRAST TECHNIQUE: Contiguous axial images were obtained from the base of the skull through the vertex without intravenous contrast. COMPARISON:  CT head 01/29/2018. FINDINGS: Brain: There is no evidence of acute intracranial hemorrhage, mass lesion, brain edema or extra-axial fluid collection. Chronic encephalomalacia in the left parietal lobe appears stable, consistent with an old cortical and subcortical infarct. There is no CT evidence of acute cortical infarction. Mild progression of ventriculomegaly. Vascular: Mild intracranial vascular calcifications. No hyperdense vessel identified. Skull: Negative for fracture or focal lesion. Mild calvarial hyperostosis. Sinuses/Orbits: Minimal dependent mucosal thickening in the left division of the sphenoid sinus. The additional visualized paranasal sinuses are clear without air-fluid levels. The mastoid air cells and middle ears are clear. Other: None. IMPRESSION: 1. No definite acute intracranial findings. Stable old left parietal infarct. 2. Slight progression in ventricular dilatation without secondary signs of obstructive hydrocephalus. Electronically Signed   By: Carey Bullocks M.D.   On: 07/15/2020 14:16   MR ANGIO HEAD WO CONTRAST  Result Date: 07/15/2020 CLINICAL DATA:  Initial evaluation for neuro deficit, stroke suspected. EXAM: MRI HEAD WITHOUT CONTRAST MRA HEAD WITHOUT CONTRAST MRA NECK WITHOUT CONTRAST TECHNIQUE: Multiplanar, multiecho pulse sequences of the brain and surrounding structures were obtained without intravenous contrast. Angiographic images of the Circle of Willis were obtained using MRA technique without intravenous contrast. Angiographic images of the neck were obtained using MRA technique without intravenous contrast. Carotid stenosis measurements (when applicable) are obtained utilizing NASCET criteria, using the distal internal carotid diameter as the denominator.  COMPARISON:  Prior CT from earlier the same day. FINDINGS: MRI HEAD FINDINGS Brain: Generalized age-related cerebral atrophy. Patchy and confluent T2/FLAIR hyperintensity within the periventricular deep white matter both cerebral hemispheres most consistent with chronic small vessel ischemic disease, moderate in nature. Encephalomalacia and gliosis involving the left parietotemporal region consistent with a chronic left MCA distribution infarct. Mild petechial hemorrhage without frank hemorrhagic transformation. No significant regional mass effect. No other evidence for acute or subacute ischemia. Gray-white matter differentiation otherwise maintained. No other areas of chronic cortical infarction. No mass lesion, midline shift or mass effect. No hydrocephalus or extra-axial fluid collection. Pituitary gland suprasellar region normal. Midline structures intact. Immediately anterior and adjacent to the chronic left MCA territory infarct, there are scattered areas of restricted diffusion involving the cortex of the left posterior frontotemporal region, consistent with an acute left MCA territory infarct (series 5, image 77). Associated Vascular: Hypoplastic left vertebral artery not well seen. Major intracranial vascular flow voids are otherwise maintained. Skull and upper cervical spine: Craniocervical junction within normal limits. Bone marrow signal intensity normal. No scalp soft tissue abnormality. Sinuses/Orbits: Globes and orbital soft tissues within normal limits. Paranasal sinuses are largely clear. No mastoid effusion. Inner ear structures grossly normal. Other: None. MRA HEAD FINDINGS ANTERIOR CIRCULATION: Visualized distal cervical  segments of the internal carotid arteries are patent with antegrade flow. Petrous, cavernous, and supraclinoid segments patent without flow-limiting stenosis. A1 segments patent bilaterally. Normal anterior communicating artery complex. Anterior cerebral arteries patent to their  distal aspects without stenosis. Right M1 segment widely patent. Normal right MCA bifurcation. Distal right MCA branches well perfused. Left M1 segment patent proximally. There are severe stenoses involving the distal left M1 segment, left MCA bifurcation, and adjacent proximal M2 branches (series 1049, image 6). There appears to be involvement of both superior and inferior divisions. Left MCA branches perfused distally, with no definite proximal M2 branch occlusion. POSTERIOR CIRCULATION: Right vertebral artery dominant and patent to the vertebrobasilar junction. Left vertebral artery markedly hypoplastic and not well visualized, possibly occluded. Right PICA origin patent. Focal moderate stenosis at the vertebrobasilar junction (series 1055, image 9). This is positioned at the takeoff of a dominant left AICA. Extensive atheromatous irregularity within the left AICA. Basilar widely patent distally. Superior cerebral arteries patent bilaterally. Both PCAs supplied via a hypoplastic P1 segments and robust bilateral posterior communicating arteries. Diffuse atheromatous irregularity throughout the PCAs bilaterally without proximal high-grade stenosis. No intracranial aneurysm. MRA NECK FINDINGS AORTIC ARCH: Examination technically limited by motion and lack of IV contrast. Partially visualized aortic arch grossly within normal limits for caliber. Normal branch pattern. No obvious high-grade stenosis about the origin of the great vessels. RIGHT CAROTID SYSTEM: Partially visualized right CCA patent without stenosis. No significant atheromatous stenosis about the right carotid bulb or proximal right ICA. Right ICA patent distally without stenosis, evidence for dissection or occlusion. LEFT CAROTID SYSTEM: Partially visualized left CCA patent without appreciable stenosis. No significant atheromatous narrowing about the left carotid bulb or proximal left ICA. Left ICA patent distally without stenosis, evidence for  dissection or occlusion. VERTEBRAL ARTERIES: Both vertebral arteries appear to arise from the subclavian arteries. Strongly dominant right vertebral artery patent within the neck without appreciable stenosis or other vascular abnormality. Left vertebral artery is markedly hypoplastic and not well seen, possibly at least partially occluded. IMPRESSION: MRI HEAD IMPRESSION: 1. Scattered acute ischemic cortical infarct involving the left frontotemporal region. Associated mild petechial hemorrhage without frank hemorrhagic transformation or significant mass effect. 2. Underlying chronic left MCA distribution infarct with moderate chronic microvascular ischemic disease. MRA HEAD IMPRESSION: 1. Negative MRA for large vessel occlusion. 2. Severe stenoses involving the distal left M1 segment, left MCA bifurcation, and adjacent proximal M2 branches, with probable involvement of both superior and inferior divisions. 3. Moderate stenosis at the vertebrobasilar junction. 4. Nonvisualization of the left vertebral artery. MRA NECK IMPRESSION: 1. Technically limited exam by motion and lack of IV contrast. 2. Patency of both carotid artery systems within the neck without appreciable stenosis or other acute vascular abnormality. 3. Strongly dominant right vertebral artery patent within the neck. Hypoplastic left vertebral artery not well seen, and may be occluded/partially occluded. Electronically Signed   By: Rise Mu M.D.   On: 07/15/2020 19:54   MR ANGIO NECK WO CONTRAST  Result Date: 07/15/2020 CLINICAL DATA:  Initial evaluation for neuro deficit, stroke suspected. EXAM: MRI HEAD WITHOUT CONTRAST MRA HEAD WITHOUT CONTRAST MRA NECK WITHOUT CONTRAST TECHNIQUE: Multiplanar, multiecho pulse sequences of the brain and surrounding structures were obtained without intravenous contrast. Angiographic images of the Circle of Willis were obtained using MRA technique without intravenous contrast. Angiographic images of the  neck were obtained using MRA technique without intravenous contrast. Carotid stenosis measurements (when applicable) are obtained utilizing NASCET criteria, using the distal  internal carotid diameter as the denominator. COMPARISON:  Prior CT from earlier the same day. FINDINGS: MRI HEAD FINDINGS Brain: Generalized age-related cerebral atrophy. Patchy and confluent T2/FLAIR hyperintensity within the periventricular deep white matter both cerebral hemispheres most consistent with chronic small vessel ischemic disease, moderate in nature. Encephalomalacia and gliosis involving the left parietotemporal region consistent with a chronic left MCA distribution infarct. Mild petechial hemorrhage without frank hemorrhagic transformation. No significant regional mass effect. No other evidence for acute or subacute ischemia. Gray-white matter differentiation otherwise maintained. No other areas of chronic cortical infarction. No mass lesion, midline shift or mass effect. No hydrocephalus or extra-axial fluid collection. Pituitary gland suprasellar region normal. Midline structures intact. Immediately anterior and adjacent to the chronic left MCA territory infarct, there are scattered areas of restricted diffusion involving the cortex of the left posterior frontotemporal region, consistent with an acute left MCA territory infarct (series 5, image 77). Associated Vascular: Hypoplastic left vertebral artery not well seen. Major intracranial vascular flow voids are otherwise maintained. Skull and upper cervical spine: Craniocervical junction within normal limits. Bone marrow signal intensity normal. No scalp soft tissue abnormality. Sinuses/Orbits: Globes and orbital soft tissues within normal limits. Paranasal sinuses are largely clear. No mastoid effusion. Inner ear structures grossly normal. Other: None. MRA HEAD FINDINGS ANTERIOR CIRCULATION: Visualized distal cervical segments of the internal carotid arteries are patent with  antegrade flow. Petrous, cavernous, and supraclinoid segments patent without flow-limiting stenosis. A1 segments patent bilaterally. Normal anterior communicating artery complex. Anterior cerebral arteries patent to their distal aspects without stenosis. Right M1 segment widely patent. Normal right MCA bifurcation. Distal right MCA branches well perfused. Left M1 segment patent proximally. There are severe stenoses involving the distal left M1 segment, left MCA bifurcation, and adjacent proximal M2 branches (series 1049, image 6). There appears to be involvement of both superior and inferior divisions. Left MCA branches perfused distally, with no definite proximal M2 branch occlusion. POSTERIOR CIRCULATION: Right vertebral artery dominant and patent to the vertebrobasilar junction. Left vertebral artery markedly hypoplastic and not well visualized, possibly occluded. Right PICA origin patent. Focal moderate stenosis at the vertebrobasilar junction (series 1055, image 9). This is positioned at the takeoff of a dominant left AICA. Extensive atheromatous irregularity within the left AICA. Basilar widely patent distally. Superior cerebral arteries patent bilaterally. Both PCAs supplied via a hypoplastic P1 segments and robust bilateral posterior communicating arteries. Diffuse atheromatous irregularity throughout the PCAs bilaterally without proximal high-grade stenosis. No intracranial aneurysm. MRA NECK FINDINGS AORTIC ARCH: Examination technically limited by motion and lack of IV contrast. Partially visualized aortic arch grossly within normal limits for caliber. Normal branch pattern. No obvious high-grade stenosis about the origin of the great vessels. RIGHT CAROTID SYSTEM: Partially visualized right CCA patent without stenosis. No significant atheromatous stenosis about the right carotid bulb or proximal right ICA. Right ICA patent distally without stenosis, evidence for dissection or occlusion. LEFT CAROTID  SYSTEM: Partially visualized left CCA patent without appreciable stenosis. No significant atheromatous narrowing about the left carotid bulb or proximal left ICA. Left ICA patent distally without stenosis, evidence for dissection or occlusion. VERTEBRAL ARTERIES: Both vertebral arteries appear to arise from the subclavian arteries. Strongly dominant right vertebral artery patent within the neck without appreciable stenosis or other vascular abnormality. Left vertebral artery is markedly hypoplastic and not well seen, possibly at least partially occluded. IMPRESSION: MRI HEAD IMPRESSION: 1. Scattered acute ischemic cortical infarct involving the left frontotemporal region. Associated mild petechial hemorrhage without frank hemorrhagic transformation  or significant mass effect. 2. Underlying chronic left MCA distribution infarct with moderate chronic microvascular ischemic disease. MRA HEAD IMPRESSION: 1. Negative MRA for large vessel occlusion. 2. Severe stenoses involving the distal left M1 segment, left MCA bifurcation, and adjacent proximal M2 branches, with probable involvement of both superior and inferior divisions. 3. Moderate stenosis at the vertebrobasilar junction. 4. Nonvisualization of the left vertebral artery. MRA NECK IMPRESSION: 1. Technically limited exam by motion and lack of IV contrast. 2. Patency of both carotid artery systems within the neck without appreciable stenosis or other acute vascular abnormality. 3. Strongly dominant right vertebral artery patent within the neck. Hypoplastic left vertebral artery not well seen, and may be occluded/partially occluded. Electronically Signed   By: Rise Mu M.D.   On: 07/15/2020 19:54   MR Brain Wo Contrast (neuro protocol)  Result Date: 07/15/2020 CLINICAL DATA:  Initial evaluation for neuro deficit, stroke suspected. EXAM: MRI HEAD WITHOUT CONTRAST MRA HEAD WITHOUT CONTRAST MRA NECK WITHOUT CONTRAST TECHNIQUE: Multiplanar, multiecho pulse  sequences of the brain and surrounding structures were obtained without intravenous contrast. Angiographic images of the Circle of Willis were obtained using MRA technique without intravenous contrast. Angiographic images of the neck were obtained using MRA technique without intravenous contrast. Carotid stenosis measurements (when applicable) are obtained utilizing NASCET criteria, using the distal internal carotid diameter as the denominator. COMPARISON:  Prior CT from earlier the same day. FINDINGS: MRI HEAD FINDINGS Brain: Generalized age-related cerebral atrophy. Patchy and confluent T2/FLAIR hyperintensity within the periventricular deep white matter both cerebral hemispheres most consistent with chronic small vessel ischemic disease, moderate in nature. Encephalomalacia and gliosis involving the left parietotemporal region consistent with a chronic left MCA distribution infarct. Mild petechial hemorrhage without frank hemorrhagic transformation. No significant regional mass effect. No other evidence for acute or subacute ischemia. Gray-white matter differentiation otherwise maintained. No other areas of chronic cortical infarction. No mass lesion, midline shift or mass effect. No hydrocephalus or extra-axial fluid collection. Pituitary gland suprasellar region normal. Midline structures intact. Immediately anterior and adjacent to the chronic left MCA territory infarct, there are scattered areas of restricted diffusion involving the cortex of the left posterior frontotemporal region, consistent with an acute left MCA territory infarct (series 5, image 77). Associated Vascular: Hypoplastic left vertebral artery not well seen. Major intracranial vascular flow voids are otherwise maintained. Skull and upper cervical spine: Craniocervical junction within normal limits. Bone marrow signal intensity normal. No scalp soft tissue abnormality. Sinuses/Orbits: Globes and orbital soft tissues within normal limits.  Paranasal sinuses are largely clear. No mastoid effusion. Inner ear structures grossly normal. Other: None. MRA HEAD FINDINGS ANTERIOR CIRCULATION: Visualized distal cervical segments of the internal carotid arteries are patent with antegrade flow. Petrous, cavernous, and supraclinoid segments patent without flow-limiting stenosis. A1 segments patent bilaterally. Normal anterior communicating artery complex. Anterior cerebral arteries patent to their distal aspects without stenosis. Right M1 segment widely patent. Normal right MCA bifurcation. Distal right MCA branches well perfused. Left M1 segment patent proximally. There are severe stenoses involving the distal left M1 segment, left MCA bifurcation, and adjacent proximal M2 branches (series 1049, image 6). There appears to be involvement of both superior and inferior divisions. Left MCA branches perfused distally, with no definite proximal M2 branch occlusion. POSTERIOR CIRCULATION: Right vertebral artery dominant and patent to the vertebrobasilar junction. Left vertebral artery markedly hypoplastic and not well visualized, possibly occluded. Right PICA origin patent. Focal moderate stenosis at the vertebrobasilar junction (series 1055, image 9). This is  positioned at the takeoff of a dominant left AICA. Extensive atheromatous irregularity within the left AICA. Basilar widely patent distally. Superior cerebral arteries patent bilaterally. Both PCAs supplied via a hypoplastic P1 segments and robust bilateral posterior communicating arteries. Diffuse atheromatous irregularity throughout the PCAs bilaterally without proximal high-grade stenosis. No intracranial aneurysm. MRA NECK FINDINGS AORTIC ARCH: Examination technically limited by motion and lack of IV contrast. Partially visualized aortic arch grossly within normal limits for caliber. Normal branch pattern. No obvious high-grade stenosis about the origin of the great vessels. RIGHT CAROTID SYSTEM: Partially  visualized right CCA patent without stenosis. No significant atheromatous stenosis about the right carotid bulb or proximal right ICA. Right ICA patent distally without stenosis, evidence for dissection or occlusion. LEFT CAROTID SYSTEM: Partially visualized left CCA patent without appreciable stenosis. No significant atheromatous narrowing about the left carotid bulb or proximal left ICA. Left ICA patent distally without stenosis, evidence for dissection or occlusion. VERTEBRAL ARTERIES: Both vertebral arteries appear to arise from the subclavian arteries. Strongly dominant right vertebral artery patent within the neck without appreciable stenosis or other vascular abnormality. Left vertebral artery is markedly hypoplastic and not well seen, possibly at least partially occluded. IMPRESSION: MRI HEAD IMPRESSION: 1. Scattered acute ischemic cortical infarct involving the left frontotemporal region. Associated mild petechial hemorrhage without frank hemorrhagic transformation or significant mass effect. 2. Underlying chronic left MCA distribution infarct with moderate chronic microvascular ischemic disease. MRA HEAD IMPRESSION: 1. Negative MRA for large vessel occlusion. 2. Severe stenoses involving the distal left M1 segment, left MCA bifurcation, and adjacent proximal M2 branches, with probable involvement of both superior and inferior divisions. 3. Moderate stenosis at the vertebrobasilar junction. 4. Nonvisualization of the left vertebral artery. MRA NECK IMPRESSION: 1. Technically limited exam by motion and lack of IV contrast. 2. Patency of both carotid artery systems within the neck without appreciable stenosis or other acute vascular abnormality. 3. Strongly dominant right vertebral artery patent within the neck. Hypoplastic left vertebral artery not well seen, and may be occluded/partially occluded. Electronically Signed   By: Rise Mu M.D.   On: 07/15/2020 19:54   DG Humerus Right  Result  Date: 07/15/2020 CLINICAL DATA:  Altered mental status.  History of stroke. EXAM: RIGHT FOREARM - 2 VIEW; RIGHT HUMERUS - 2+ VIEW COMPARISON:  None. FINDINGS: No evidence of acute fracture or dislocation. There are mild degenerative changes at the shoulder and elbow. No significant elbow joint effusion. Ulnar minus variance noted at the wrist. There is an IV in the antecubital fossa. No focal soft tissue abnormality or foreign body identified. IMPRESSION: No acute findings identified within the right upper arm or forearm. Mild degenerative changes. Electronically Signed   By: Carey Bullocks M.D.   On: 07/15/2020 14:20   ECHOCARDIOGRAM COMPLETE BUBBLE STUDY  Result Date: 07/16/2020    ECHOCARDIOGRAM REPORT   Patient Name:   Kara Mendoza Date of Exam: 07/16/2020 Medical Rec #:  [id number]      Height:       62.0 in Accession #:    [id number]     Weight:       152.6 lb Date of Birth:  Aug 08, 1950      BSA:          1.704 m Patient Age:    70 years       BP:           141/72 mmHg Patient Gender: F  HR:           75 bpm. Exam Location:  Inpatient Procedure: 2D Echo, Cardiac Doppler and Color Doppler Indications:    Stroke 434.91/I63.9  History:        Patient has prior history of Echocardiogram examinations, most                 recent 08/10/2013.  Sonographer:    Roosvelt Maser RDCS Referring Phys: [id number] VISHAL R PATEL IMPRESSIONS  1. Left ventricular ejection fraction, by estimation, is 60 to 65%. The left ventricle has normal function. The left ventricle has no regional wall motion abnormalities. Left ventricular diastolic parameters are consistent with Grade I diastolic dysfunction (impaired relaxation).  2. Right ventricular systolic function is normal. The right ventricular size is normal.  3. The mitral valve is grossly normal. Trivial mitral valve regurgitation. No evidence of mitral stenosis.  4. The aortic valve is tricuspid. There is mild calcification of the aortic valve. There is mild thickening  of the aortic valve. Aortic valve regurgitation is not visualized. Mild to moderate aortic valve sclerosis/calcification is present, without any evidence of aortic stenosis.  5. Agitated saline contrast bubble study was negative, with no evidence of any interatrial shunt. Comparison(s): Compared to prior echo report in 2015, there is no significant change. Conclusion(s)/Recommendation(s): No intracardiac source of embolism detected on this transthoracic study. A transesophageal echocardiogram is recommended to exclude cardiac source of embolism if clinically indicated. FINDINGS  Left Ventricle: Left ventricular ejection fraction, by estimation, is 60 to 65%. The left ventricle has normal function. The left ventricle has no regional wall motion abnormalities. The left ventricular internal cavity size was normal in size. There is  borderline concentric left ventricular hypertrophy. Left ventricular diastolic parameters are consistent with Grade I diastolic dysfunction (impaired relaxation). Right Ventricle: The right ventricular size is normal. No increase in right ventricular wall thickness. Right ventricular systolic function is normal. Left Atrium: Left atrial size was normal in size. Right Atrium: Right atrial size was normal in size. Pericardium: There is no evidence of pericardial effusion. Mitral Valve: The mitral valve is grossly normal. There is mild thickening of the mitral valve leaflet(s). There is mild calcification of the mitral valve leaflet(s). Mild mitral annular calcification. Trivial mitral valve regurgitation. No evidence of mitral valve stenosis. Tricuspid Valve: The tricuspid valve is normal in structure. Tricuspid valve regurgitation is trivial. Aortic Valve: The aortic valve is tricuspid. There is mild calcification of the aortic valve. There is mild thickening of the aortic valve. Aortic valve regurgitation is not visualized. Mild to moderate aortic valve sclerosis/calcification is present,  without any evidence of aortic stenosis. Aortic valve mean gradient measures 4.0 mmHg. Aortic valve peak gradient measures 7.2 mmHg. Aortic valve area, by VTI measures 2.30 cm. Pulmonic Valve: The pulmonic valve was normal in structure. Pulmonic valve regurgitation is trivial. Aorta: The aortic root and ascending aorta are structurally normal, with no evidence of dilitation. IAS/Shunts: No atrial level shunt detected by color flow Doppler. Agitated saline contrast was given intravenously to evaluate for intracardiac shunting. Agitated saline contrast bubble study was negative, with no evidence of any interatrial shunt.  LEFT VENTRICLE PLAX 2D LVIDd:         3.40 cm  Diastology LVIDs:         2.20 cm  LV e' medial:    7.18 cm/s LV PW:         1.00 cm  LV E/e' medial:  10.3 LV IVS:  1.00 cm  LV e' lateral:   6.20 cm/s LVOT diam:     1.80 cm  LV E/e' lateral: 12.0 LV SV:         52 LV SV Index:   30 LVOT Area:     2.54 cm  RIGHT VENTRICLE RV Basal diam:  3.20 cm LEFT ATRIUM             Index       RIGHT ATRIUM          Index LA diam:        3.00 cm 1.76 cm/m  RA Area:     8.71 cm LA Vol (A2C):   34.3 ml 20.13 ml/m RA Volume:   15.40 ml 9.04 ml/m LA Vol (A4C):   20.0 ml 11.74 ml/m LA Biplane Vol: 27.7 ml 16.26 ml/m  AORTIC VALVE AV Area (Vmax):    1.85 cm AV Area (Vmean):   1.82 cm AV Area (VTI):     2.30 cm AV Vmax:           134.00 cm/s AV Vmean:          93.900 cm/s AV VTI:            0.226 m AV Peak Grad:      7.2 mmHg AV Mean Grad:      4.0 mmHg LVOT Vmax:         97.50 cm/s LVOT Vmean:        67.300 cm/s LVOT VTI:          0.204 m LVOT/AV VTI ratio: 0.90  AORTA Ao Root diam: 3.00 cm Ao Asc diam:  2.60 cm MITRAL VALVE MV Area (PHT): 3.79 cm    SHUNTS MV Decel Time: 200 msec    Systemic VTI:  0.20 m MV E velocity: 74.20 cm/s  Systemic Diam: 1.80 cm MV A velocity: 90.20 cm/s MV E/A ratio:  0.82 Laurance Flatten MD Electronically signed by Laurance Flatten MD Signature Date/Time: 07/16/2020/5:09:07  PM    Final    (Echo, Carotid, EGD, Colonoscopy, ERCP)    Subjective: No complaints feels great  Discharge Exam: Vitals:   07/19/20 0403 07/19/20 0811  BP: (!) 153/76 (!) 148/78  Pulse: (!) 58 66  Resp: 18 19  Temp: 97.8 F (36.6 C) 98.9 F (37.2 C)  SpO2: 100% 97%   Vitals:   07/18/20 1946 07/18/20 2341 07/19/20 0403 07/19/20 0811  BP: (!) 149/70 (!) 149/73 (!) 153/76 (!) 148/78  Pulse: 72 65 (!) 58 66  Resp: 18 18 18 19   Temp: 98.5 F (36.9 C) 98.4 F (36.9 C) 97.8 F (36.6 C) 98.9 F (37.2 C)  TempSrc: Oral Oral Oral Oral  SpO2: 99% 98% 100% 97%    General: Pt is alert, awake, not in acute distress Cardiovascular: RRR, S1/S2 +, no rubs, no gallops Respiratory: CTA bilaterally, no wheezing, no rhonchi Abdominal: Soft, NT, ND, bowel sounds + Extremities: no edema, no cyanosis    The results of significant diagnostics from this hospitalization (including imaging, microbiology, ancillary and laboratory) are listed below for reference.     Microbiology: Recent Results (from the past 240 hour(s))  Resp Panel by RT-PCR (Flu A&B, Covid) Nasopharyngeal Swab     Status: None   Collection Time: 07/15/20  1:21 PM   Specimen: Nasopharyngeal Swab; Nasopharyngeal(NP) swabs in vial transport medium  Result Value Ref Range Status   SARS Coronavirus 2 by RT PCR NEGATIVE NEGATIVE Final    Comment: (NOTE)  SARS-CoV-2 target nucleic acids are NOT DETECTED.  The SARS-CoV-2 RNA is generally detectable in upper respiratory specimens during the acute phase of infection. The lowest concentration of SARS-CoV-2 viral copies this assay can detect is 138 copies/mL. A negative result does not preclude SARS-Cov-2 infection and should not be used as the sole basis for treatment or other patient management decisions. A negative result may occur with  improper specimen collection/handling, submission of specimen other than nasopharyngeal swab, presence of viral mutation(s) within  the areas targeted by this assay, and inadequate number of viral copies(<138 copies/mL). A negative result must be combined with clinical observations, patient history, and epidemiological information. The expected result is Negative.  Fact Sheet for Patients:  BloggerCourse.com  Fact Sheet for Healthcare Providers:  SeriousBroker.it  This test is no t yet approved or cleared by the [place] FDA and  has been authorized for detection and/or diagnosis of SARS-CoV-2 by FDA under an Emergency Use Authorization (EUA). This EUA will remain  in effect (meaning this test can be used) for the duration of the COVID-19 declaration under Section 564(b)(1) of the Act, 21 U.S.C.section 360bbb-3(b)(1), unless the authorization is terminated  or revoked sooner.       Influenza A by PCR NEGATIVE NEGATIVE Final   Influenza B by PCR NEGATIVE NEGATIVE Final    Comment: (NOTE) The Xpert Xpress SARS-CoV-2/FLU/RSV plus assay is intended as an aid in the diagnosis of influenza from Nasopharyngeal swab specimens and should not be used as a sole basis for treatment. Nasal washings and aspirates are unacceptable for Xpert Xpress SARS-CoV-2/FLU/RSV testing.  Fact Sheet for Patients: BloggerCourse.com  Fact Sheet for Healthcare Providers: SeriousBroker.it  This test is not yet approved or cleared by the [place] FDA and has been authorized for detection and/or diagnosis of SARS-CoV-2 by FDA under an Emergency Use Authorization (EUA). This EUA will remain in effect (meaning this test can be used) for the duration of the COVID-19 declaration under Section 564(b)(1) of the Act, 21 U.S.C. section 360bbb-3(b)(1), unless the authorization is terminated or revoked.  Performed at [facility], [address] [place], [place] [address]   SARS CORONAVIRUS 2 (TAT 6-24 HRS) Nasopharyngeal  Nasopharyngeal Swab     Status: None   Collection Time: 07/19/20  1:34 AM   Specimen: Nasopharyngeal Swab  Result Value Ref Range Status   SARS Coronavirus 2 NEGATIVE NEGATIVE Final    Comment: (NOTE) SARS-CoV-2 target nucleic acids are NOT DETECTED.  The SARS-CoV-2 RNA is generally detectable in upper and lower respiratory specimens during the acute phase of infection. Negative results do not preclude SARS-CoV-2 infection, do not rule out co-infections with other pathogens, and should not be used as the sole basis for treatment or other patient management decisions. Negative results must be combined with clinical observations, patient history, and epidemiological information. The expected result is Negative.  Fact Sheet for Patients: HairSlick.no  Fact Sheet for Healthcare Providers: quierodirigir.com  This test is not yet approved or cleared by the [place] FDA and  has been authorized for detection and/or diagnosis of SARS-CoV-2 by FDA under an Emergency Use Authorization (EUA). This EUA will remain  in effect (meaning this test can be used) for the duration of the COVID-19 declaration under Se ction 564(b)(1) of the Act, 21 U.S.C. section 360bbb-3(b)(1), unless the authorization is terminated or revoked sooner.  Performed at [facility], [address], [place], [place] [address]      Labs: BNP (last  3 results) No results for input(s): BNP in the last 8760 hours. Basic Metabolic Panel: Recent Labs  Lab 07/15/20 1321 07/17/20 0232  NA 138 138  K 4.1 4.3  CL 102 105  CO2 26 28  GLUCOSE 152* 117*  BUN 8 12  CREATININE 0.76 0.73  CALCIUM 10.3 10.4*  MG  --  1.9  PHOS  --  3.1   Liver Function Tests: Recent Labs  Lab 07/15/20 1321  AST 25  ALT 31  ALKPHOS 71  BILITOT 1.0  PROT 6.8  ALBUMIN 3.9   No results for input(s): LIPASE, AMYLASE in the last 168 hours. No results for input(s):  AMMONIA in the last 168 hours. CBC: Recent Labs  Lab 07/15/20 1321 07/17/20 0232  WBC 10.2 8.5  NEUTROABS 8.1*  --   HGB 15.8* 14.3  HCT 48.3* 42.7  MCV 91.1 89.5  PLT 407* 364   Cardiac Enzymes: No results for input(s): CKTOTAL, CKMB, CKMBINDEX, TROPONINI in the last 168 hours. BNP: Invalid input(s): POCBNP CBG: No results for input(s): GLUCAP in the last 168 hours. D-Dimer No results for input(s): DDIMER in the last 72 hours. Hgb A1c No results for input(s): HGBA1C in the last 72 hours. Lipid Profile No results for input(s): CHOL, HDL, LDLCALC, TRIG, CHOLHDL, LDLDIRECT in the last 72 hours. Thyroid function studies No results for input(s): TSH, T4TOTAL, T3FREE, THYROIDAB in the last 72 hours.  Invalid input(s): FREET3 Anemia work up No results for input(s): VITAMINB12, FOLATE, FERRITIN, TIBC, IRON, RETICCTPCT in the last 72 hours. Urinalysis    Component Value Date/Time   COLORURINE YELLOW 07/15/2020 2112   APPEARANCEUR HAZY (A) 07/15/2020 2112   LABSPEC 1.021 07/15/2020 2112   PHURINE 7.0 07/15/2020 2112   GLUCOSEU NEGATIVE 07/15/2020 2112   GLUCOSEU NEGATIVE 04/01/2020 1429   HGBUR NEGATIVE 07/15/2020 2112   BILIRUBINUR NEGATIVE 07/15/2020 2112   BILIRUBINUR negative 04/14/2020 1500   KETONESUR 80 (A) 07/15/2020 2112   PROTEINUR NEGATIVE 07/15/2020 2112   UROBILINOGEN 0.2 04/19/2020 1104   NITRITE NEGATIVE 07/15/2020 2112   LEUKOCYTESUR SMALL (A) 07/15/2020 2112   Sepsis Labs Invalid input(s): PROCALCITONIN,  WBC,  LACTICIDVEN Microbiology Recent Results (from the past 240 hour(s))  Resp Panel by RT-PCR (Flu A&B, Covid) Nasopharyngeal Swab     Status: None   Collection Time: 07/15/20  1:21 PM   Specimen: Nasopharyngeal Swab; Nasopharyngeal(NP) swabs in vial transport medium  Result Value Ref Range Status   SARS Coronavirus 2 by RT PCR NEGATIVE NEGATIVE Final    Comment: (NOTE) SARS-CoV-2 target nucleic acids are NOT DETECTED.  The SARS-CoV-2 RNA is  generally detectable in upper respiratory specimens during the acute phase of infection. The lowest concentration of SARS-CoV-2 viral copies this assay can detect is 138 copies/mL. A negative result does not preclude SARS-Cov-2 infection and should not be used as the sole basis for treatment or other patient management decisions. A negative result may occur with  improper specimen collection/handling, submission of specimen other than nasopharyngeal swab, presence of viral mutation(s) within the areas targeted by this assay, and inadequate number of viral copies(<138 copies/mL). A negative result must be combined with clinical observations, patient history, and epidemiological information. The expected result is Negative.  Fact Sheet for Patients:  BloggerCourse.comhttps://www.fda.gov/media/152166/download  Fact Sheet for Healthcare Providers:  SeriousBroker.ithttps://www.fda.gov/media/152162/download  This test is no t yet approved or cleared by the [place] FDA and  has been authorized for detection and/or diagnosis of SARS-CoV-2 by FDA under an Emergency Use Authorization (  EUA). This EUA will remain  in effect (meaning this test can be used) for the duration of the COVID-19 declaration under Section 564(b)(1) of the Act, 21 U.S.C.section 360bbb-3(b)(1), unless the authorization is terminated  or revoked sooner.       Influenza A by PCR NEGATIVE NEGATIVE Final   Influenza B by PCR NEGATIVE NEGATIVE Final    Comment: (NOTE) The Xpert Xpress SARS-CoV-2/FLU/RSV plus assay is intended as an aid in the diagnosis of influenza from Nasopharyngeal swab specimens and should not be used as a sole basis for treatment. Nasal washings and aspirates are unacceptable for Xpert Xpress SARS-CoV-2/FLU/RSV testing.  Fact Sheet for Patients: BloggerCourse.com  Fact Sheet for Healthcare Providers: SeriousBroker.it  This test is not yet approved or cleared by the [place] FDA and has been authorized for detection and/or diagnosis of SARS-CoV-2 by FDA under an Emergency Use Authorization (EUA). This EUA will remain in effect (meaning this test can be used) for the duration of the COVID-19 declaration under Section 564(b)(1) of the Act, 21 U.S.C. section 360bbb-3(b)(1), unless the authorization is terminated or revoked.  Performed at [facility], [address], [place], [place] [address]   SARS CORONAVIRUS 2 (TAT 6-24 HRS) Nasopharyngeal Nasopharyngeal Swab     Status: None   Collection Time: 07/19/20  1:34 AM   Specimen: Nasopharyngeal Swab  Result Value Ref Range Status   SARS Coronavirus 2 NEGATIVE NEGATIVE Final    Comment: (NOTE) SARS-CoV-2 target nucleic acids are NOT DETECTED.  The SARS-CoV-2 RNA is generally detectable in upper and lower respiratory specimens during the acute phase of infection. Negative results do not preclude SARS-CoV-2 infection, do not rule out co-infections with other pathogens, and should not be used as the sole basis for treatment or other patient management decisions. Negative results must be combined with clinical observations, patient history, and epidemiological information. The expected result is Negative.  Fact Sheet for Patients: HairSlick.no  Fact Sheet for Healthcare Providers: quierodirigir.com  This test is not yet approved or cleared by the [place] FDA and  has been authorized for detection and/or diagnosis of SARS-CoV-2 by FDA under an Emergency Use Authorization (EUA). This EUA will remain  in effect (meaning this test can be used) for the duration of the COVID-19 declaration under Se ction 564(b)(1) of the Act, 21 U.S.C. section 360bbb-3(b)(1), unless the authorization is terminated or revoked sooner.  Performed at [facility], [address] [place], [place] [address]      Time coordinating discharge: Over 40  minutes  SIGNED:   Marinda Elk, MD  [facility] 07/19/2020, 8:39 AM Pager   If 7PM-7AM, please contact night-coverage [email or web address] Password [email or web address]

## 2020-07-20 ENCOUNTER — Telehealth: Payer: Self-pay | Admitting: Family

## 2020-07-20 ENCOUNTER — Inpatient Hospital Stay: Admission: RE | Admit: 2020-07-20 | Payer: Medicare Other | Source: Ambulatory Visit

## 2020-07-20 NOTE — Telephone Encounter (Signed)
Caller: Steward Drone [facility]) Call back # [phone]  Checking on the status of the medical clearance fax on 06/27/20 The patient is scheduled for surgery on 08/11/20.

## 2020-07-20 NOTE — Telephone Encounter (Signed)
FYI to provider  I have called Steward Drone back to informed her that we have not received any clearance paperwork. I have also informed her that since Kara Mendoza has moved to [place] Pt stated that she no longer wanted to continue care with her and miss Steward Drone stated understanding. She will contact pt.

## 2020-07-21 ENCOUNTER — Ambulatory Visit: Payer: Medicare Other

## 2020-07-25 ENCOUNTER — Telehealth: Payer: Self-pay

## 2020-07-25 NOTE — Telephone Encounter (Signed)
Pt is wanting to continue her care at [facility] and Vernona Rieger is in agreement with this plan. She can see anyone who is still seeing NP or have a TOC.

## 2020-07-25 NOTE — Telephone Encounter (Signed)
Patient called this morning stating she will need a PCP in [place] ([place] location) since Vernona Rieger has moved to the [place] location. I advised patient that should not be a problem and she could give that office a call to start the process of a transfer of care.  She stated she was told she had to either speak to Vernona Rieger about this or see Vernona Rieger before this could be done.  I advised patient she did not have to do either of these things to start the process to transfer care.  I advised patient to call the [facility] office to start the process as soon as she could and that we understand these things happen when providers change offices.

## 2020-07-27 ENCOUNTER — Encounter: Payer: Self-pay | Admitting: *Deleted

## 2020-07-27 NOTE — Progress Notes (Signed)
Patient ID: Kara Mendoza, female   DOB: 06/13/1950, 70 y.o.   MRN: [id number] Patient discharged from [facility] 07/24/20. Patient enrolled for [employer] to ship a 30 day cardiac event monitor to address on file. Letter with instructions mailed to patient.

## 2020-07-28 ENCOUNTER — Other Ambulatory Visit: Payer: Self-pay

## 2020-07-28 ENCOUNTER — Ambulatory Visit (INDEPENDENT_AMBULATORY_CARE_PROVIDER_SITE_OTHER): Payer: Medicare Other | Admitting: Family

## 2020-07-28 VITALS — BP 122/60 | HR 86 | Temp 98.5°F | Ht 62.0 in | Wt 155.2 lb

## 2020-07-28 DIAGNOSIS — E1140 Type 2 diabetes mellitus with diabetic neuropathy, unspecified: Secondary | ICD-10-CM

## 2020-07-28 DIAGNOSIS — M25562 Pain in left knee: Secondary | ICD-10-CM

## 2020-07-28 DIAGNOSIS — G8929 Other chronic pain: Secondary | ICD-10-CM | POA: Diagnosis not present

## 2020-07-28 DIAGNOSIS — F339 Major depressive disorder, recurrent, unspecified: Secondary | ICD-10-CM

## 2020-07-28 DIAGNOSIS — Z8673 Personal history of transient ischemic attack (TIA), and cerebral infarction without residual deficits: Secondary | ICD-10-CM | POA: Diagnosis not present

## 2020-07-28 DIAGNOSIS — G5643 Causalgia of bilateral upper limbs: Secondary | ICD-10-CM | POA: Diagnosis not present

## 2020-07-28 DIAGNOSIS — G5612 Other lesions of median nerve, left upper limb: Secondary | ICD-10-CM | POA: Diagnosis not present

## 2020-07-28 DIAGNOSIS — E782 Mixed hyperlipidemia: Secondary | ICD-10-CM

## 2020-07-28 DIAGNOSIS — I10 Essential (primary) hypertension: Secondary | ICD-10-CM

## 2020-07-28 DIAGNOSIS — G5640 Causalgia of unspecified upper limb: Secondary | ICD-10-CM | POA: Diagnosis not present

## 2020-07-28 DIAGNOSIS — R471 Dysarthria and anarthria: Secondary | ICD-10-CM | POA: Diagnosis not present

## 2020-07-28 DIAGNOSIS — I639 Cerebral infarction, unspecified: Secondary | ICD-10-CM

## 2020-07-28 MED ORDER — METFORMIN HCL 500 MG PO TABS: ORAL_TABLET | ORAL | 3 refills | Status: DC

## 2020-07-28 MED ORDER — DULOXETINE HCL 60 MG PO CPEP: ORAL_CAPSULE | ORAL | 1 refills | Status: DC

## 2020-07-28 MED ORDER — CLOPIDOGREL BISULFATE 75 MG PO TABS: ORAL_TABLET | ORAL | 1 refills | Status: DC

## 2020-07-28 MED ORDER — LOSARTAN POTASSIUM-HCTZ 100-25 MG PO TABS: 1.0000 | ORAL_TABLET | Freq: Every day | ORAL | 3 refills | Status: DC

## 2020-07-28 MED ORDER — OMEPRAZOLE 20 MG PO CPDR: DELAYED_RELEASE_CAPSULE | ORAL | 1 refills | Status: DC

## 2020-07-28 MED ORDER — ATORVASTATIN CALCIUM 40 MG PO TABS: 1.0000 | ORAL_TABLET | Freq: Every day | ORAL | 3 refills | Status: DC

## 2020-07-28 MED ORDER — GABAPENTIN 300 MG PO CAPS: ORAL_CAPSULE | ORAL | 2 refills | Status: AC

## 2020-07-28 NOTE — Progress Notes (Signed)
Kara Mendoza is a 70 y.o. female with the following history as recorded in EpicCare:  Patient Active Problem List   Diagnosis Date Noted   Acute CVA (cerebrovascular accident) (HCC) 07/15/2020   Dysuria 05/29/2018   Residual cognitive deficit as late effect of stroke 04/26/2016   Hemiparesis affecting left side as late effect of cerebrovascular accident (CVA) (HCC) 04/26/2016   Carpal tunnel syndrome on left 11/29/2015   DDD (degenerative disc disease), cervical 11/29/2015   Depression 11/29/2015   Chronic pain of left knee 09/04/2015   Headache 09/04/2015   Paroxysmal atrial fibrillation (HCC) 07/22/2015   Complex regional pain syndrome type 2 of upper extremity 11/05/2013   Carpal tunnel syndrome on right 10/22/2013   Chronic embolism and thrombosis of deep vein of left upper extremity (HCC) 10/22/2013   Cardiac device in situ 10/14/2013   Essential hypertension 09/30/2013   Complex regional pain syndrome type 1 of both upper extremities 09/30/2013   Diabetes mellitus (HCC) 09/28/2013   Hyperthyroidism 09/28/2013   Median nerve neuritis 09/28/2013   Ataxia, late effect of cerebrovascular disease 09/28/2013   Spastic hemiplegia affecting dominant side (HCC) 09/28/2013   CVA (cerebral vascular accident) (HCC) 08/15/2013   Cerebral embolism with cerebral infarction (HCC) 08/09/2013   Dysarthria 08/09/2013   Abnormality of gait 05/01/2012   Incontinence of urine 05/01/2012   Tobacco abuse 01/28/2012   Tobacco dependence syndrome 01/28/2012   Plantar fasciitis 09/01/2010   Allergic rhinitis 07/11/2010   Persistent cough 06/16/2010   Asthma night-time symptoms 06/14/2010   GERD (gastroesophageal reflux disease) 05/26/2010   Stroke (HCC) 05/26/2010   Hyperlipidemia 05/26/2010   Osteoarthritis 05/26/2010   Mononeuritis 05/26/2010    Current Outpatient Medications  Medication Sig Dispense Refill   acetaminophen (TYLENOL) 500 MG tablet Take 2 tablets (1,000 mg total) by mouth  every 6 (six) hours as needed. 30 tablet 0   albuterol (PROVENTIL HFA;VENTOLIN HFA) 108 (90 Base) MCG/ACT inhaler Inhale 2 puffs into the lungs every 6 (six) hours as needed for wheezing or shortness of breath.     diclofenac Sodium (VOLTAREN) 1 % GEL Apply 2 g topically 4 (four) times daily. 100 g 1   Glucose Blood (BLOOD GLUCOSE TEST STRIPS) STRP Inject 1 Units into the skin 4 (four) times daily -  before meals and at bedtime. OneTouch Ultra mini strips 100 each 3   Multiple Vitamin (MULTIVITAMIN WITH MINERALS) TABS tablet Take 1 tablet by mouth daily.     ONETOUCH DELICA LANCETS FINE MISC 1 Units by Does not apply route 4 (four) times daily -  before meals and at bedtime. 100 each 3   oxyCODONE-acetaminophen (PERCOCET) 10-325 MG tablet Take 1 tablet by mouth every 6 (six) hours as needed for pain.     triamcinolone (KENALOG) 0.1 % Apply 1 application topically 2 (two) times daily. 45 g 0   atorvastatin (LIPITOR) 40 MG tablet Take 1 tablet (40 mg total) by mouth daily. 90 tablet 3   clopidogrel (PLAVIX) 75 MG tablet TAKE 1 TABLET(75 MG) BY MOUTH DAILY 90 tablet 1   DULoxetine (CYMBALTA) 60 MG capsule TAKE 1 CAPSULE(60 MG) BY MOUTH DAILY 90 capsule 1   fluticasone (FLONASE) 50 MCG/ACT nasal spray Place 1 spray into both nostrils daily. (Patient not taking: Reported on 07/28/2020) 16 g 6   gabapentin (NEURONTIN) 300 MG capsule TAKE 1 CAPSULE(300 MG) BY MOUTH THREE TIMES DAILY 270 capsule 2   losartan-hydrochlorothiazide (HYZAAR) 100-25 MG tablet Take 1 tablet by mouth daily. 90 tablet  3   metFORMIN (GLUCOPHAGE) 500 MG tablet TAKE 1 TABLET(500 MG) BY MOUTH DAILY WITH BREAKFAST 90 tablet 3   omeprazole (PRILOSEC) 20 MG capsule TAKE 1 CAPSULE(20 MG) BY MOUTH TWICE DAILY 180 capsule 1   No current facility-administered medications for this visit.    Allergies: Aspirin, Diclofenac, and Penicillins  Past Medical History:  Diagnosis Date   Abnormality of gait    Acute bronchitis    Allergy     Anxiety    Anxiety state, unspecified    Asthma    Chronic arm pain 2008   left   Chronic headache    Chronic leg pain 2008   left   Chronic pain syndrome    Constipation    Diabetes mellitus (HCC)    Dysarthria    Esophageal reflux    Fatigue    History of fall    Hyperlipidemia    Hypertension    Hyperthyroidism    LOOP Recorder 10/14/2013   Major depressive disorder, recurrent episode, moderate (HCC)    Memory loss    Neuromuscular disorder (HCC)    Osteoarthritis    Stroke (HCC)    Tobacco use disorder     Past Surgical History:  Procedure Laterality Date   CESAREAN SECTION     x3   FINGER SURGERY     LOOP RECORDER IMPLANT  08-12-2013   MDT LINQ implanted by Dr Graciela Husbands for cryptogenic stroke   LOOP RECORDER IMPLANT N/A 08/12/2013   Procedure: LOOP RECORDER IMPLANT;  Surgeon: Duke Salvia, MD;  Location: [facility] CATH LAB;  Service: Cardiovascular;  Laterality: N/A;   TEE WITHOUT CARDIOVERSION N/A 08/12/2013   Procedure: TRANSESOPHAGEAL ECHOCARDIOGRAM (TEE);  Surgeon: Wendall Stade, MD;  Location: [facility] ENDOSCOPY;  Service: Cardiovascular;  Laterality: N/A;    Family History  Problem Relation Age of Onset   Cancer Father    Diabetes Sister    Diabetes Brother     Social History   Tobacco Use   Smoking status: Light Smoker    Packs/day: 0.20    Years: 15.00    Pack years: 3.00    Types: Cigarettes   Smokeless tobacco: Never   Tobacco comments:     smoking 3-4 a day  Substance Use Topics   Alcohol use: No    Alcohol/week: 0.0 standard drinks    Subjective:   Patient was admitted to [facility] hospital with CVA on 07/15/20; discharged on 6/14 to SNF; patient is here alone to discuss getting home health set up; also needs updated prescriptions for chronic care needs;   Objective:  Vitals:   07/28/20 1529  BP: 122/60  Pulse: 86  Temp: 98.5 F (36.9 C)  TempSrc: Oral  SpO2: 98%  Weight: 155 lb 3.2 oz (70.4 kg)  Height: 5\' 2"  (1.575 m)    General: Well developed, well  nourished, in no acute distress  Skin : Warm and dry.  Head: Normocephalic and atraumatic  Lungs: Respirations unlabored; clear to auscultation bilaterally without wheeze, rales, rhonchi  CVS exam: normal rate and regular rhythm.  Neurologic: Alert and oriented; speech intact; face symmetrical; requires assistance to walk;  Assessment:  1. Cerebrovascular accident (CVA), unspecified mechanism (HCC)   2. Mixed hyperlipidemia   3. Recurrent major depressive disorder, remission status unspecified (HCC)   4. Complex regional pain syndrome type 2 of both upper extremities   5. Neuritis of left median nerve   6. Complex regional pain syndrome type 2 of upper extremity, unspecified laterality  7. Dysarthria   8. Chronic pain of left knee   9. Essential hypertension   10. Type 2 diabetes mellitus with diabetic neuropathy, without long-term current use of insulin (HCC)     Plan:  Orders for home health and rolling walker updated; Prescriptions updated as requested; reviewed labs done 11 days ago; Follow up in 3 months, sooner prn.  This visit occurred during the SARS-CoV-2 public health emergency.  Safety protocols were in place, including screening questions prior to the visit, additional usage of staff PPE, and extensive cleaning of exam room while observing appropriate contact time as indicated for disinfecting solutions.     No follow-ups on file.  Orders Placed This Encounter  Procedures   For home use only DME 4 wheeled rolling walker with seat ([id number])    Order Specific Question:   Patient needs a walker to treat with the following condition    Answer:   CVA (cerebral vascular accident) [facility]) [[id number]]   Ambulatory referral to Home Health    Referral Priority:   Routine    Referral Type:   Home Health Care    Referral Reason:   Specialty Services Required    Requested Specialty:   Home Health Services    Number of Visits Requested:   1    Requested Prescriptions   Signed  Prescriptions Disp Refills   atorvastatin (LIPITOR) 40 MG tablet 90 tablet 3    Sig: Take 1 tablet (40 mg total) by mouth daily.   clopidogrel (PLAVIX) 75 MG tablet 90 tablet 1    Sig: TAKE 1 TABLET(75 MG) BY MOUTH DAILY   DULoxetine (CYMBALTA) 60 MG capsule 90 capsule 1    Sig: TAKE 1 CAPSULE(60 MG) BY MOUTH DAILY   gabapentin (NEURONTIN) 300 MG capsule 270 capsule 2    Sig: TAKE 1 CAPSULE(300 MG) BY MOUTH THREE TIMES DAILY   losartan-hydrochlorothiazide (HYZAAR) 100-25 MG tablet 90 tablet 3    Sig: Take 1 tablet by mouth daily.   metFORMIN (GLUCOPHAGE) 500 MG tablet 90 tablet 3    Sig: TAKE 1 TABLET(500 MG) BY MOUTH DAILY WITH BREAKFAST   omeprazole (PRILOSEC) 20 MG capsule 180 capsule 1    Sig: TAKE 1 CAPSULE(20 MG) BY MOUTH TWICE DAILY

## 2020-08-01 DIAGNOSIS — M1812 Unilateral primary osteoarthritis of first carpometacarpal joint, left hand: Secondary | ICD-10-CM | POA: Diagnosis not present

## 2020-08-01 DIAGNOSIS — G90519 Complex regional pain syndrome I of unspecified upper limb: Secondary | ICD-10-CM | POA: Diagnosis not present

## 2020-08-01 DIAGNOSIS — G5602 Carpal tunnel syndrome, left upper limb: Secondary | ICD-10-CM | POA: Diagnosis not present

## 2020-08-01 DIAGNOSIS — G894 Chronic pain syndrome: Secondary | ICD-10-CM | POA: Diagnosis not present

## 2020-08-02 ENCOUNTER — Other Ambulatory Visit: Payer: Self-pay | Admitting: Family

## 2020-08-02 DIAGNOSIS — H532 Diplopia: Secondary | ICD-10-CM | POA: Diagnosis not present

## 2020-08-02 DIAGNOSIS — I639 Cerebral infarction, unspecified: Secondary | ICD-10-CM

## 2020-08-10 ENCOUNTER — Ambulatory Visit (INDEPENDENT_AMBULATORY_CARE_PROVIDER_SITE_OTHER): Payer: Medicare Other

## 2020-08-10 DIAGNOSIS — I480 Paroxysmal atrial fibrillation: Secondary | ICD-10-CM

## 2020-08-10 DIAGNOSIS — I639 Cerebral infarction, unspecified: Secondary | ICD-10-CM | POA: Diagnosis not present

## 2020-08-15 ENCOUNTER — Encounter (HOSPITAL_BASED_OUTPATIENT_CLINIC_OR_DEPARTMENT_OTHER): Payer: Self-pay

## 2020-08-15 ENCOUNTER — Ambulatory Visit (HOSPITAL_BASED_OUTPATIENT_CLINIC_OR_DEPARTMENT_OTHER): Admit: 2020-08-15 | Payer: Medicare Other | Admitting: Orthopedic Surgery

## 2020-08-15 SURGERY — CARPAL TUNNEL RELEASE
Anesthesia: Choice | Laterality: Left

## 2020-08-31 DIAGNOSIS — G90519 Complex regional pain syndrome I of unspecified upper limb: Secondary | ICD-10-CM | POA: Diagnosis not present

## 2020-08-31 DIAGNOSIS — Z79899 Other long term (current) drug therapy: Secondary | ICD-10-CM | POA: Diagnosis not present

## 2020-08-31 DIAGNOSIS — G894 Chronic pain syndrome: Secondary | ICD-10-CM | POA: Diagnosis not present

## 2020-08-31 DIAGNOSIS — Z79891 Long term (current) use of opiate analgesic: Secondary | ICD-10-CM | POA: Diagnosis not present

## 2020-08-31 DIAGNOSIS — M1812 Unilateral primary osteoarthritis of first carpometacarpal joint, left hand: Secondary | ICD-10-CM | POA: Diagnosis not present

## 2020-08-31 DIAGNOSIS — G5602 Carpal tunnel syndrome, left upper limb: Secondary | ICD-10-CM | POA: Diagnosis not present

## 2020-09-06 ENCOUNTER — Other Ambulatory Visit: Payer: Self-pay | Admitting: Family

## 2020-09-06 DIAGNOSIS — E1140 Type 2 diabetes mellitus with diabetic neuropathy, unspecified: Secondary | ICD-10-CM

## 2020-09-07 ENCOUNTER — Telehealth: Payer: Self-pay

## 2020-09-07 NOTE — Telephone Encounter (Signed)
Pt called in states that she would like antibiotic sent she states that she may have covid. I asked the pt to go get tested. Pt states that her caregiver and few other people have covid. I told her if she is covid positive we could get her a vv. Pt says she can't go out her house. Please advice

## 2020-09-08 NOTE — Telephone Encounter (Signed)
I have called the pt back and informed her that she was needing a home covid test to be done. I have instructed her to call her son to get one from a pharmacy so he can test her at home. If the test is neg, then she is to take a picture and let her home health agency know so she can get her nursing aids back into her house so she can get taken care of. Pt stated understanding and will call her son.   Pt stated that she has had a cough for almost a week but she feels good otherwise.

## 2020-09-13 ENCOUNTER — Other Ambulatory Visit: Payer: Self-pay | Admitting: Cardiology

## 2020-09-13 DIAGNOSIS — I639 Cerebral infarction, unspecified: Secondary | ICD-10-CM

## 2020-09-13 DIAGNOSIS — I480 Paroxysmal atrial fibrillation: Secondary | ICD-10-CM

## 2020-10-19 DIAGNOSIS — Z8249 Family history of ischemic heart disease and other diseases of the circulatory system: Secondary | ICD-10-CM | POA: Diagnosis not present

## 2020-10-19 DIAGNOSIS — M19011 Primary osteoarthritis, right shoulder: Secondary | ICD-10-CM | POA: Diagnosis not present

## 2020-10-19 DIAGNOSIS — M7591 Shoulder lesion, unspecified, right shoulder: Secondary | ICD-10-CM | POA: Diagnosis not present

## 2020-10-19 DIAGNOSIS — Z7902 Long term (current) use of antithrombotics/antiplatelets: Secondary | ICD-10-CM | POA: Diagnosis not present

## 2020-10-19 DIAGNOSIS — M7592 Shoulder lesion, unspecified, left shoulder: Secondary | ICD-10-CM | POA: Diagnosis not present

## 2020-10-19 DIAGNOSIS — G5602 Carpal tunnel syndrome, left upper limb: Secondary | ICD-10-CM | POA: Diagnosis not present

## 2020-10-19 DIAGNOSIS — Z79899 Other long term (current) drug therapy: Secondary | ICD-10-CM | POA: Diagnosis not present

## 2020-10-19 DIAGNOSIS — F17210 Nicotine dependence, cigarettes, uncomplicated: Secondary | ICD-10-CM | POA: Diagnosis not present

## 2020-10-19 DIAGNOSIS — Z7951 Long term (current) use of inhaled steroids: Secondary | ICD-10-CM | POA: Diagnosis not present

## 2020-10-19 DIAGNOSIS — M778 Other enthesopathies, not elsewhere classified: Secondary | ICD-10-CM | POA: Diagnosis not present

## 2020-10-19 DIAGNOSIS — E119 Type 2 diabetes mellitus without complications: Secondary | ICD-10-CM | POA: Diagnosis not present

## 2020-10-19 DIAGNOSIS — E039 Hypothyroidism, unspecified: Secondary | ICD-10-CM | POA: Diagnosis not present

## 2020-10-19 DIAGNOSIS — Z880 Allergy status to penicillin: Secondary | ICD-10-CM | POA: Diagnosis not present

## 2020-10-19 DIAGNOSIS — E7800 Pure hypercholesterolemia, unspecified: Secondary | ICD-10-CM | POA: Diagnosis not present

## 2020-10-19 DIAGNOSIS — Z833 Family history of diabetes mellitus: Secondary | ICD-10-CM | POA: Diagnosis not present

## 2020-10-19 DIAGNOSIS — Z886 Allergy status to analgesic agent status: Secondary | ICD-10-CM | POA: Diagnosis not present

## 2020-10-19 DIAGNOSIS — M19012 Primary osteoarthritis, left shoulder: Secondary | ICD-10-CM | POA: Diagnosis not present

## 2020-10-19 DIAGNOSIS — Z7984 Long term (current) use of oral hypoglycemic drugs: Secondary | ICD-10-CM | POA: Diagnosis not present

## 2020-10-19 DIAGNOSIS — Z8673 Personal history of transient ischemic attack (TIA), and cerebral infarction without residual deficits: Secondary | ICD-10-CM | POA: Diagnosis not present

## 2020-10-19 DIAGNOSIS — G90512 Complex regional pain syndrome I of left upper limb: Secondary | ICD-10-CM | POA: Diagnosis not present

## 2020-10-19 DIAGNOSIS — I10 Essential (primary) hypertension: Secondary | ICD-10-CM | POA: Diagnosis not present

## 2020-10-24 DIAGNOSIS — M79603 Pain in arm, unspecified: Secondary | ICD-10-CM | POA: Diagnosis not present

## 2020-10-24 DIAGNOSIS — G90519 Complex regional pain syndrome I of unspecified upper limb: Secondary | ICD-10-CM | POA: Diagnosis not present

## 2020-10-24 DIAGNOSIS — G5602 Carpal tunnel syndrome, left upper limb: Secondary | ICD-10-CM | POA: Diagnosis not present

## 2020-10-24 DIAGNOSIS — M1812 Unilateral primary osteoarthritis of first carpometacarpal joint, left hand: Secondary | ICD-10-CM | POA: Diagnosis not present

## 2020-10-24 DIAGNOSIS — G894 Chronic pain syndrome: Secondary | ICD-10-CM | POA: Diagnosis not present

## 2020-10-28 ENCOUNTER — Ambulatory Visit: Payer: Medicare Other | Admitting: Family

## 2020-10-28 DIAGNOSIS — G5602 Carpal tunnel syndrome, left upper limb: Secondary | ICD-10-CM | POA: Diagnosis not present

## 2020-10-28 DIAGNOSIS — M67912 Unspecified disorder of synovium and tendon, left shoulder: Secondary | ICD-10-CM | POA: Diagnosis not present

## 2020-11-01 ENCOUNTER — Ambulatory Visit: Payer: Medicare Other | Admitting: Family

## 2020-11-21 ENCOUNTER — Telehealth: Payer: Self-pay | Admitting: Family

## 2020-11-21 NOTE — Telephone Encounter (Signed)
Pt faxed in document to be filled out by provider. (1 page Disability Parking Placard) Document put at front office tray under providers name.

## 2020-11-22 NOTE — Telephone Encounter (Signed)
I have called pt to inform her that her handicap placard form has been completed and ready for pick up. There was no answer so I left a message to call back.

## 2020-11-24 DIAGNOSIS — G894 Chronic pain syndrome: Secondary | ICD-10-CM | POA: Diagnosis not present

## 2020-11-24 DIAGNOSIS — G5602 Carpal tunnel syndrome, left upper limb: Secondary | ICD-10-CM | POA: Diagnosis not present

## 2020-11-24 DIAGNOSIS — G90519 Complex regional pain syndrome I of unspecified upper limb: Secondary | ICD-10-CM | POA: Diagnosis not present

## 2020-11-24 DIAGNOSIS — M1812 Unilateral primary osteoarthritis of first carpometacarpal joint, left hand: Secondary | ICD-10-CM | POA: Diagnosis not present

## 2020-11-24 DIAGNOSIS — Z79891 Long term (current) use of opiate analgesic: Secondary | ICD-10-CM | POA: Diagnosis not present

## 2020-11-24 DIAGNOSIS — Z79899 Other long term (current) drug therapy: Secondary | ICD-10-CM | POA: Diagnosis not present

## 2020-12-22 DIAGNOSIS — G90519 Complex regional pain syndrome I of unspecified upper limb: Secondary | ICD-10-CM | POA: Diagnosis not present

## 2020-12-22 DIAGNOSIS — G894 Chronic pain syndrome: Secondary | ICD-10-CM | POA: Diagnosis not present

## 2020-12-22 DIAGNOSIS — Z8673 Personal history of transient ischemic attack (TIA), and cerebral infarction without residual deficits: Secondary | ICD-10-CM | POA: Diagnosis not present

## 2020-12-22 DIAGNOSIS — M79603 Pain in arm, unspecified: Secondary | ICD-10-CM | POA: Diagnosis not present

## 2020-12-22 DIAGNOSIS — G5602 Carpal tunnel syndrome, left upper limb: Secondary | ICD-10-CM | POA: Diagnosis not present

## 2020-12-22 DIAGNOSIS — Z79891 Long term (current) use of opiate analgesic: Secondary | ICD-10-CM | POA: Diagnosis not present

## 2020-12-22 DIAGNOSIS — Z79899 Other long term (current) drug therapy: Secondary | ICD-10-CM | POA: Diagnosis not present

## 2020-12-22 DIAGNOSIS — M1812 Unilateral primary osteoarthritis of first carpometacarpal joint, left hand: Secondary | ICD-10-CM | POA: Diagnosis not present

## 2021-07-25 ENCOUNTER — Telehealth: Payer: Self-pay

## 2021-07-25 NOTE — Telephone Encounter (Signed)
done

## 2021-08-07 ENCOUNTER — Other Ambulatory Visit: Payer: Self-pay | Admitting: Family

## 2021-08-07 DIAGNOSIS — I639 Cerebral infarction, unspecified: Secondary | ICD-10-CM

## 2021-08-07 DIAGNOSIS — I10 Essential (primary) hypertension: Secondary | ICD-10-CM

## 2021-08-11 ENCOUNTER — Encounter: Payer: Self-pay | Admitting: Family

## 2021-08-11 ENCOUNTER — Ambulatory Visit (INDEPENDENT_AMBULATORY_CARE_PROVIDER_SITE_OTHER): Payer: 59 | Admitting: Family

## 2021-08-11 VITALS — BP 124/72 | HR 88 | Temp 98.7°F | Resp 16 | Ht 62.0 in | Wt 164.0 lb

## 2021-08-11 DIAGNOSIS — G5640 Causalgia of unspecified upper limb: Secondary | ICD-10-CM

## 2021-08-11 DIAGNOSIS — I10 Essential (primary) hypertension: Secondary | ICD-10-CM | POA: Diagnosis not present

## 2021-08-11 DIAGNOSIS — F339 Major depressive disorder, recurrent, unspecified: Secondary | ICD-10-CM

## 2021-08-11 DIAGNOSIS — E782 Mixed hyperlipidemia: Secondary | ICD-10-CM | POA: Diagnosis not present

## 2021-08-11 DIAGNOSIS — I639 Cerebral infarction, unspecified: Secondary | ICD-10-CM

## 2021-08-11 DIAGNOSIS — G5643 Causalgia of bilateral upper limbs: Secondary | ICD-10-CM

## 2021-08-11 DIAGNOSIS — Z8673 Personal history of transient ischemic attack (TIA), and cerebral infarction without residual deficits: Secondary | ICD-10-CM | POA: Diagnosis not present

## 2021-08-11 DIAGNOSIS — Z1231 Encounter for screening mammogram for malignant neoplasm of breast: Secondary | ICD-10-CM

## 2021-08-11 DIAGNOSIS — G8929 Other chronic pain: Secondary | ICD-10-CM

## 2021-08-11 DIAGNOSIS — G5612 Other lesions of median nerve, left upper limb: Secondary | ICD-10-CM

## 2021-08-11 DIAGNOSIS — R471 Dysarthria and anarthria: Secondary | ICD-10-CM

## 2021-08-11 DIAGNOSIS — M25562 Pain in left knee: Secondary | ICD-10-CM

## 2021-08-11 DIAGNOSIS — E1140 Type 2 diabetes mellitus with diabetic neuropathy, unspecified: Secondary | ICD-10-CM

## 2021-08-11 MED ORDER — ATORVASTATIN CALCIUM 40 MG PO TABS: 40.0000 mg | ORAL_TABLET | Freq: Every day | ORAL | 3 refills | Status: AC

## 2021-08-11 MED ORDER — CLOPIDOGREL BISULFATE 75 MG PO TABS: 75.0000 mg | ORAL_TABLET | Freq: Once | ORAL | 3 refills | Status: AC

## 2021-08-11 MED ORDER — DULOXETINE HCL 60 MG PO CPEP: ORAL_CAPSULE | ORAL | 3 refills | Status: AC

## 2021-08-11 MED ORDER — OMEPRAZOLE 20 MG PO CPDR: DELAYED_RELEASE_CAPSULE | ORAL | 3 refills | Status: AC

## 2021-08-11 MED ORDER — METFORMIN HCL 500 MG PO TABS: 500.0000 mg | ORAL_TABLET | Freq: Every day | ORAL | 3 refills | Status: AC

## 2021-08-11 MED ORDER — LOSARTAN POTASSIUM-HCTZ 100-25 MG PO TABS: 1.0000 | ORAL_TABLET | Freq: Every day | ORAL | 3 refills | Status: AC

## 2021-08-11 NOTE — Progress Notes (Signed)
Kara Mendoza is a 71 y.o. female with the following history as recorded in EpicCare:  Patient Active Problem List   Diagnosis Date Noted   Acute CVA (cerebrovascular accident) (HCC) 07/15/2020   Dysuria 05/29/2018   Residual cognitive deficit as late effect of stroke 04/26/2016   Hemiparesis affecting left side as late effect of cerebrovascular accident (CVA) (HCC) 04/26/2016   Carpal tunnel syndrome on left 11/29/2015   DDD (degenerative disc disease), cervical 11/29/2015   Depression 11/29/2015   Chronic pain of left knee 09/04/2015   Headache 09/04/2015   Paroxysmal atrial fibrillation (HCC) 07/22/2015   Complex regional pain syndrome type 2 of upper extremity 11/05/2013   Carpal tunnel syndrome on right 10/22/2013   Chronic embolism and thrombosis of deep vein of left upper extremity (HCC) 10/22/2013   Cardiac device in situ 10/14/2013   Essential hypertension 09/30/2013   Complex regional pain syndrome type 1 of both upper extremities 09/30/2013   Diabetes mellitus (HCC) 09/28/2013   Hyperthyroidism 09/28/2013   Median nerve neuritis 09/28/2013   Ataxia, late effect of cerebrovascular disease 09/28/2013   Spastic hemiplegia affecting dominant side (HCC) 09/28/2013   CVA (cerebral vascular accident) (HCC) 08/15/2013   Cerebral embolism with cerebral infarction (HCC) 08/09/2013   Dysarthria 08/09/2013   Abnormality of gait 05/01/2012   Incontinence of urine 05/01/2012   Tobacco abuse 01/28/2012   Tobacco dependence syndrome 01/28/2012   Plantar fasciitis 09/01/2010   Allergic rhinitis 07/11/2010   Persistent cough 06/16/2010   Asthma night-time symptoms 06/14/2010   GERD (gastroesophageal reflux disease) 05/26/2010   Stroke (HCC) 05/26/2010   Hyperlipidemia 05/26/2010   Osteoarthritis 05/26/2010   Mononeuritis 05/26/2010    Current Outpatient Medications  Medication Sig Dispense Refill   acetaminophen (TYLENOL) 500 MG tablet Take 2 tablets (1,000 mg total) by mouth  every 6 (six) hours as needed. 30 tablet 0   albuterol (PROVENTIL HFA;VENTOLIN HFA) 108 (90 Base) MCG/ACT inhaler Inhale 2 puffs into the lungs every 6 (six) hours as needed for wheezing or shortness of breath.     diclofenac Sodium (VOLTAREN) 1 % GEL Apply 2 g topically 4 (four) times daily. 100 g 1   fluticasone (FLONASE) 50 MCG/ACT nasal spray Place 1 spray into both nostrils daily. 16 g 6   gabapentin (NEURONTIN) 300 MG capsule TAKE 1 CAPSULE(300 MG) BY MOUTH THREE TIMES DAILY 270 capsule 2   Glucose Blood (BLOOD GLUCOSE TEST STRIPS) STRP Inject 1 Units into the skin 4 (four) times daily -  before meals and at bedtime. OneTouch Ultra mini strips 100 each 3   Multiple Vitamin (MULTIVITAMIN WITH MINERALS) TABS tablet Take 1 tablet by mouth daily.     ONETOUCH DELICA LANCETS FINE MISC 1 Units by Does not apply route 4 (four) times daily -  before meals and at bedtime. 100 each 3   oxyCODONE-acetaminophen (PERCOCET) 10-325 MG tablet Take 1 tablet by mouth every 6 (six) hours as needed for pain.     triamcinolone (KENALOG) 0.1 % Apply 1 application topically 2 (two) times daily. 45 g 0   atorvastatin (LIPITOR) 40 MG tablet Take 1 tablet (40 mg total) by mouth daily. 90 tablet 3   clopidogrel (PLAVIX) 75 MG tablet Take 1 tablet (75 mg total) by mouth once for 1 dose. 90 tablet 3   DULoxetine (CYMBALTA) 60 MG capsule TAKE 1 CAPSULE(60 MG) BY MOUTH DAILY 90 capsule 3   losartan-hydrochlorothiazide (HYZAAR) 100-25 MG tablet Take 1 tablet by mouth daily. 90 tablet 3  metFORMIN (GLUCOPHAGE) 500 MG tablet Take 1 tablet (500 mg total) by mouth daily with breakfast. 90 tablet 3   omeprazole (PRILOSEC) 20 MG capsule TAKE 1 CAPSULE(20 MG) BY MOUTH TWICE DAILY 180 capsule 3   No current facility-administered medications for this visit.    Allergies: Aspirin, Diclofenac, and Penicillins  Past Medical History:  Diagnosis Date   Abnormality of gait    Acute bronchitis    Allergy    Anxiety    Anxiety  state, unspecified    Asthma    Chronic arm pain 2008   left   Chronic headache    Chronic leg pain 2008   left   Chronic pain syndrome    Constipation    Diabetes mellitus (HCC)    Dysarthria    Esophageal reflux    Fatigue    History of fall    Hyperlipidemia    Hypertension    Hyperthyroidism    LOOP Recorder 10/14/2013   Major depressive disorder, recurrent episode, moderate (HCC)    Memory loss    Neuromuscular disorder (HCC)    Osteoarthritis    Stroke (HCC)    Tobacco use disorder     Past Surgical History:  Procedure Laterality Date   CESAREAN SECTION     x3   FINGER SURGERY     LOOP RECORDER IMPLANT  08-12-2013   MDT LINQ implanted by Dr Graciela Husbands for cryptogenic stroke   LOOP RECORDER IMPLANT N/A 08/12/2013   Procedure: LOOP RECORDER IMPLANT;  Surgeon: Duke Salvia, MD;  Location: [facility] CATH LAB;  Service: Cardiovascular;  Laterality: N/A;   TEE WITHOUT CARDIOVERSION N/A 08/12/2013   Procedure: TRANSESOPHAGEAL ECHOCARDIOGRAM (TEE);  Surgeon: Wendall Stade, MD;  Location: [facility] ENDOSCOPY;  Service: Cardiovascular;  Laterality: N/A;    Family History  Problem Relation Age of Onset   Cancer Father    Diabetes Sister    Diabetes Brother     Social History   Tobacco Use   Smoking status: Light Smoker    Packs/day: 0.20    Years: 15.00    Total pack years: 3.00    Types: Cigarettes   Smokeless tobacco: Never   Tobacco comments:     smoking 3-4 a day  Substance Use Topics   Alcohol use: No    Alcohol/week: 0.0 standard drinks of alcohol    Subjective:   Follow up on chronic care needs; has not been seen since June 2022; Notes that she does have home aide who comes in daily;   Is under care of pain management- per patient, they are managing her Oxycodone and Gabapentin;   Per patient, she has been going by train regularly to [facility] for evaluation for evaluation/ treatment of chronic hand pain/ carpal tunnel syndrome; she did not want to have any provider  locally treat her needs; there are no records available for review;   Does agree today to being scheduled for mammogram;  +smoking 3-4 cigarettes/ day- not ready to quit;       Objective:  Vitals:   08/11/21 1411  BP: 124/72  Pulse: 88  Resp: 16  Temp: 98.7 F (37.1 C)  TempSrc: Oral  SpO2: 100%  Weight: 164 lb (74.4 kg)  Height: 5\' 2"  (1.575 m)    General: Well developed, well nourished, in no acute distress  Skin : Warm and dry.  Head: Normocephalic and atraumatic  Eyes: Sclera and conjunctiva clear; pupils round and reactive to light; extraocular movements intact  Ears:  External normal; canals clear; tympanic membranes normal  Oropharynx: Pink, supple. No suspicious lesions  Neck: Supple without thyromegaly, adenopathy  Lungs: Respirations unlabored; clear to auscultation bilaterally without wheeze, rales, rhonchi  CVS exam: normal rate and regular rhythm.  Abdomen: Soft; nontender; nondistended; normoactive bowel sounds; no masses or hepatosplenomegaly  Musculoskeletal: No deformities; no active joint inflammation  Extremities: No edema, cyanosis, clubbing  Vessels: Symmetric bilaterally  Neurologic: Alert and oriented; speech intact; face symmetrical; moves all extremities well; CNII-XII intact without focal deficit  Assessment:  1. Screening mammogram for breast cancer   2. Mixed hyperlipidemia   3. Cerebrovascular accident (CVA), unspecified mechanism ([place])   4. Essential hypertension   5. Type 2 diabetes mellitus with diabetic neuropathy, without long-term current use of insulin ([place])   6. Recurrent major depressive disorder, remission status unspecified ([place])   7. Complex regional pain syndrome type 2 of both upper extremities   8. Neuritis of left median nerve   9. Complex regional pain syndrome type 2 of upper extremity, unspecified laterality   10. Dysarthria   11. Chronic pain of left knee     Plan:  Order updated for mammogram;  Labs and refills  updated for chronic care needs; per patient, she is under care of pain management and is working with provider at [place] for her carpal tunnel/ chronic hand pain;  Patient has declined vaccines- documented;   No follow-ups on file.  Orders Placed This Encounter  Procedures   MM Digital Screening    Standing Status:   Future    Standing Expiration Date:   08/12/2022    Order Specific Question:   Reason for Exam (SYMPTOM  OR DIAGNOSIS REQUIRED)    Answer:   screening mammogram    Order Specific Question:   Preferred imaging location?    Answer:   [facility]    Requested Prescriptions   Signed Prescriptions Disp Refills   atorvastatin (LIPITOR) 40 MG tablet 90 tablet 3    Sig: Take 1 tablet (40 mg total) by mouth daily.   clopidogrel (PLAVIX) 75 MG tablet 90 tablet 3    Sig: Take 1 tablet (75 mg total) by mouth once for 1 dose.   losartan-hydrochlorothiazide (HYZAAR) 100-25 MG tablet 90 tablet 3    Sig: Take 1 tablet by mouth daily.   metFORMIN (GLUCOPHAGE) 500 MG tablet 90 tablet 3    Sig: Take 1 tablet (500 mg total) by mouth daily with breakfast.   omeprazole (PRILOSEC) 20 MG capsule 180 capsule 3    Sig: TAKE 1 CAPSULE(20 MG) BY MOUTH TWICE DAILY   DULoxetine (CYMBALTA) 60 MG capsule 90 capsule 3    Sig: TAKE 1 CAPSULE(60 MG) BY MOUTH DAILY

## 2021-08-12 LAB — COMPREHENSIVE METABOLIC PANEL
AG Ratio: 1.8 (calc) (ref 1.0–2.5)
ALT: 19 U/L (ref 6–29)
AST: 11 U/L (ref 10–35)
Albumin: 4.3 g/dL (ref 3.6–5.1)
Alkaline phosphatase (APISO): 68 U/L (ref 37–153)
BUN: 8 mg/dL (ref 7–25)
CO2: 30 mmol/L (ref 20–32)
Calcium: 10.5 mg/dL — ABNORMAL HIGH (ref 8.6–10.4)
Chloride: 97 mmol/L — ABNORMAL LOW (ref 98–110)
Creat: 0.87 mg/dL (ref 0.60–1.00)
Globulin: 2.4 g/dL (calc) (ref 1.9–3.7)
Glucose, Bld: 85 mg/dL (ref 65–99)
Potassium: 3.9 mmol/L (ref 3.5–5.3)
Sodium: 137 mmol/L (ref 135–146)
Total Bilirubin: 0.5 mg/dL (ref 0.2–1.2)
Total Protein: 6.7 g/dL (ref 6.1–8.1)

## 2021-08-12 LAB — LIPID PANEL
Cholesterol: 188 mg/dL (ref <200)
HDL: 52 mg/dL (ref >=50)
LDL Cholesterol (Calc): 107 mg/dL (calc) — ABNORMAL HIGH
Non-HDL Cholesterol (Calc): 136 mg/dL (calc) — ABNORMAL HIGH (ref <130)
Total CHOL/HDL Ratio: 3.6 (calc) (ref <5.0)
Triglycerides: 177 mg/dL — ABNORMAL HIGH (ref <150)

## 2021-08-12 LAB — CBC WITH DIFFERENTIAL/PLATELET
Absolute Monocytes: 605 cells/uL (ref 200–950)
Basophils Absolute: 18 cells/uL (ref 0–200)
Basophils Relative: 0.2 %
Eosinophils Absolute: 71 cells/uL (ref 15–500)
Eosinophils Relative: 0.8 %
HCT: 45.6 % — ABNORMAL HIGH (ref 35.0–45.0)
Hemoglobin: 15.3 g/dL (ref 11.7–15.5)
Lymphs Abs: 1816 cells/uL (ref 850–3900)
MCH: 30.6 pg (ref 27.0–33.0)
MCHC: 33.6 g/dL (ref 32.0–36.0)
MCV: 91.2 fL (ref 80.0–100.0)
MPV: 9 fL (ref 7.5–12.5)
Monocytes Relative: 6.8 %
Neutro Abs: 6390 cells/uL (ref 1500–7800)
Neutrophils Relative %: 71.8 %
Platelets: 405 10*3/uL — ABNORMAL HIGH (ref 140–400)
RBC: 5 10*6/uL (ref 3.80–5.10)
RDW: 11.9 % (ref 11.0–15.0)
Total Lymphocyte: 20.4 %
WBC: 8.9 10*3/uL (ref 3.8–10.8)

## 2021-08-12 LAB — HEMOGLOBIN A1C
Hgb A1c MFr Bld: 5.7 % of total Hgb — ABNORMAL HIGH (ref <5.7)
Mean Plasma Glucose: 117 mg/dL
eAG (mmol/L): 6.5 mmol/L

## 2021-08-13 IMAGING — DX DG ABDOMEN 1V
2 series · 2 of 2 positions shown · non-contrast
Comparison: None.

CLINICAL DATA: Rule out metallic foreign body prior to MRI

EXAM:
ABDOMEN - 1 VIEW

[abdomen kub (1 of 2)]
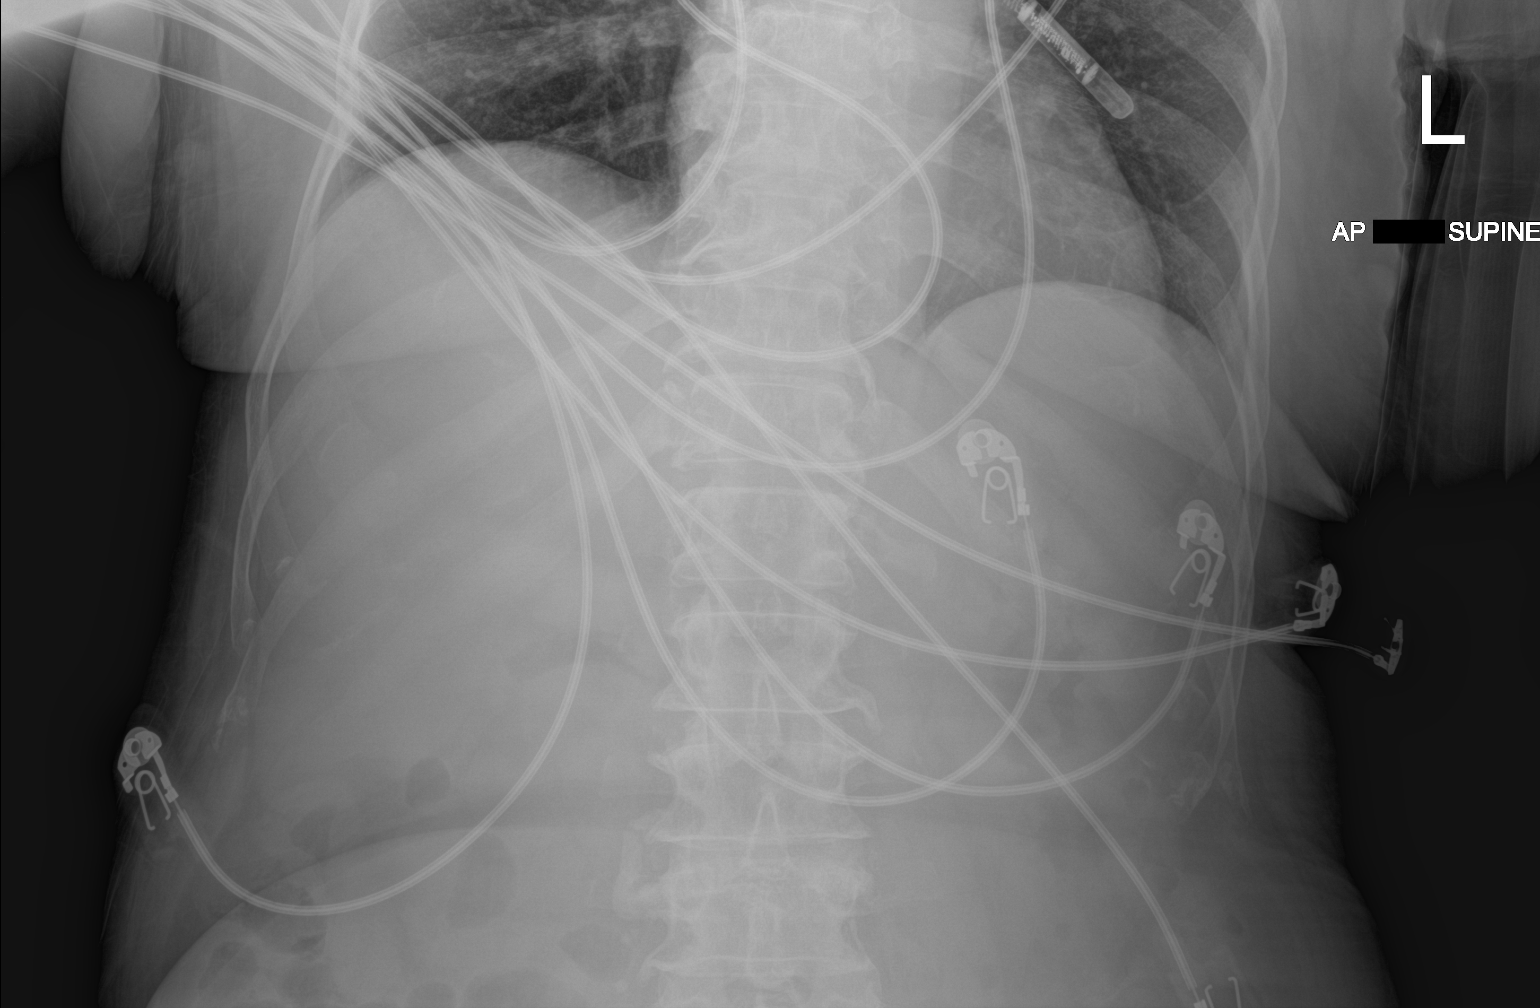

[abdomen kub (2 of 2)]
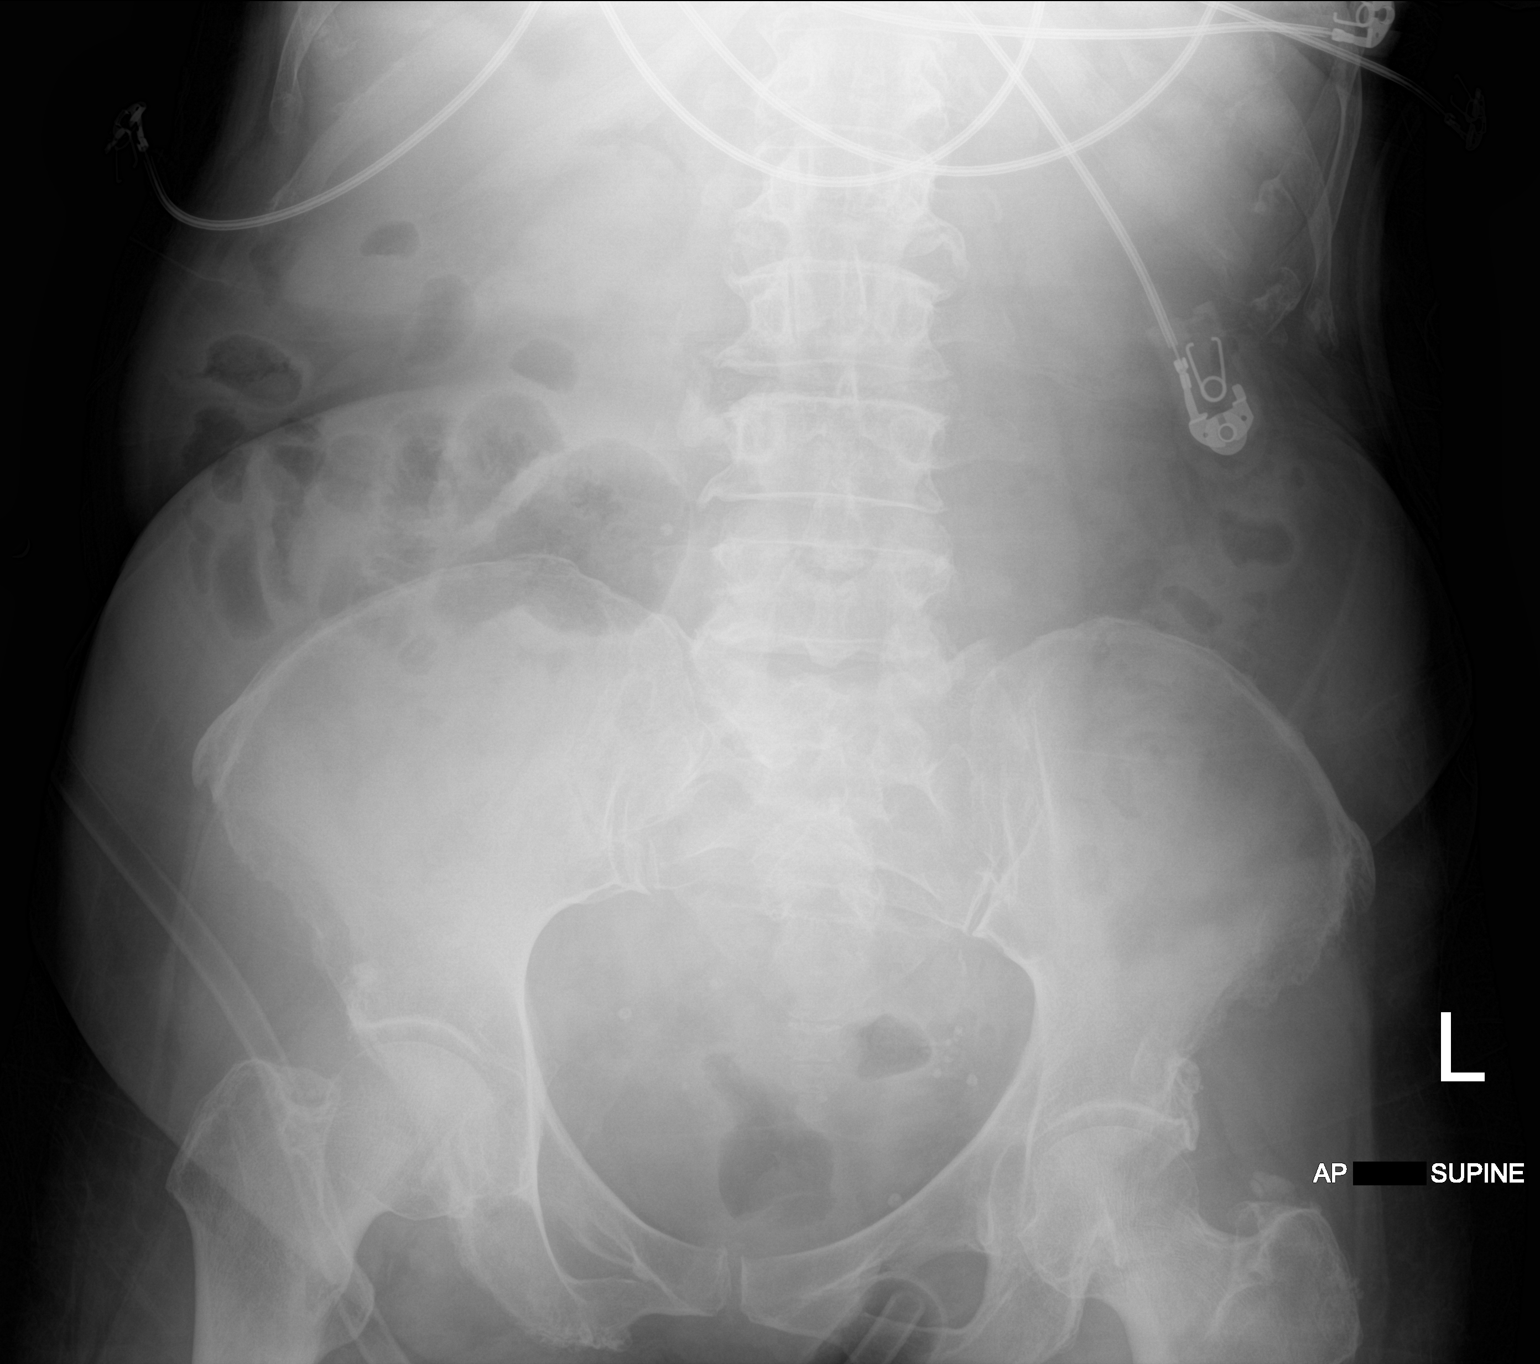

[2 of 2 positions shown; findings below may reference images not displayed]

FINDINGS: Loop recorder is noted in the left anterior chest wall. Scattered
large and small bowel gas is noted. No retained metallic foreign
body is identified. Degenerative changes of lumbar spine are noted.
IMPRESSION: No retained metallic foreign body identified. Loop recorder in the
left chest wall is noted.

## 2021-08-13 IMAGING — MR MR HEAD W/O CM
9 of 11 series · 36 of 48 positions shown · non-contrast
Comparison: Prior CT from earlier the same day.

CLINICAL DATA: Initial evaluation for neuro deficit, stroke
suspected.



[Series 5: DWI · axial · 3.0mm · 0.88mm/px · z∈[-126,+20]mm · 9 of 100 slices shown (1 of 4)]
[im 1/100]
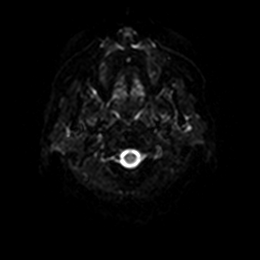
[im 13/100]
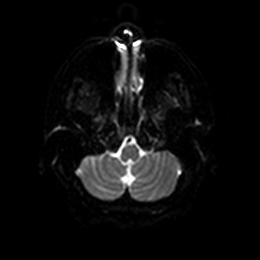
[im 25/100]
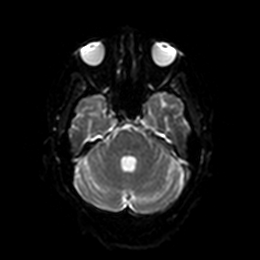
[im 38/100]
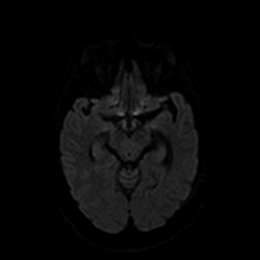
[im 50/100]
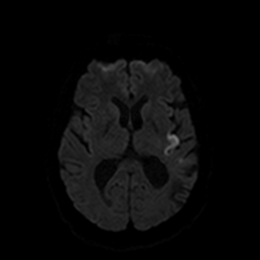
[im 62/100]
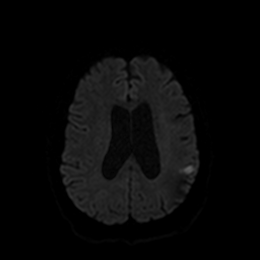
[im 75/100]
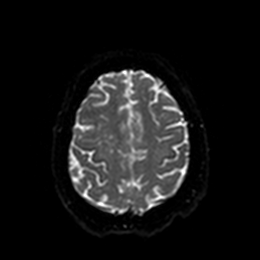
[im 87/100]
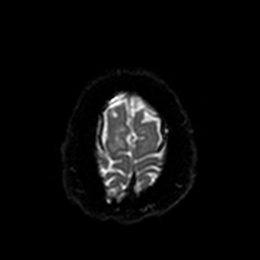
[im 100/100]
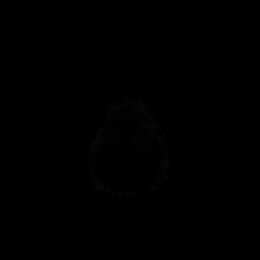

[Series 6: DWI · axial · 3.0mm · 0.88mm/px · z∈[-126,+20]mm · 5 of 50 slices shown (2 of 4)]
[im 1/50]
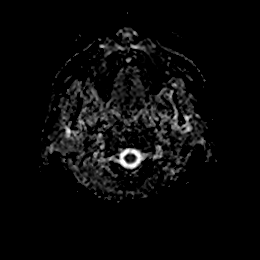
[im 13/50]
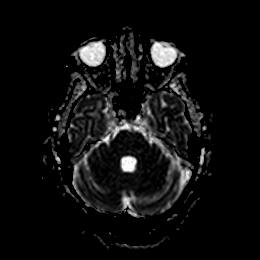
[im 25/50]
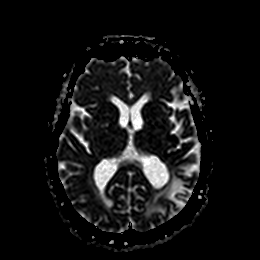
[im 37/50]
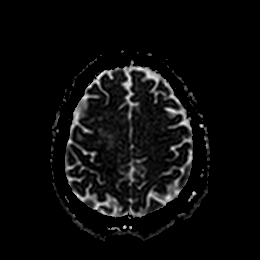
[im 50/50]
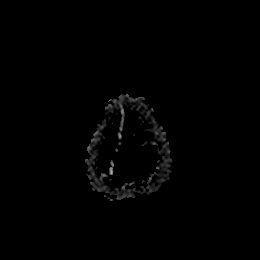

[Series 7: DWI · coronal · 4.0mm · 0.88mm/px · 7 of 72 slices shown (3 of 4)]
[im 1/72]
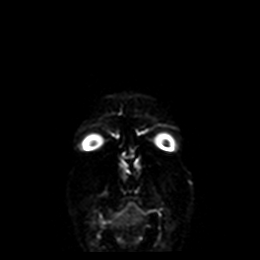
[im 12/72]
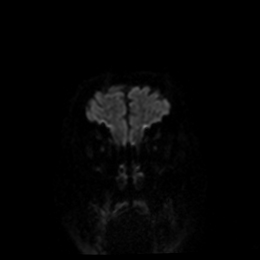
[im 24/72]
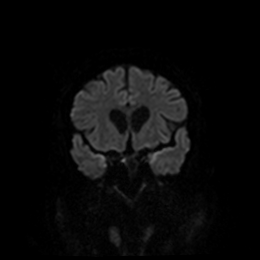
[im 36/72]
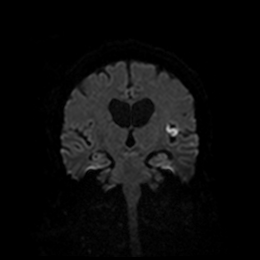
[im 48/72]
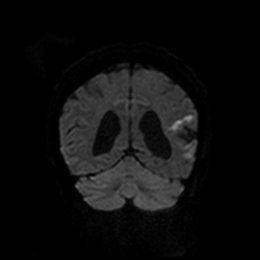
[im 60/72]
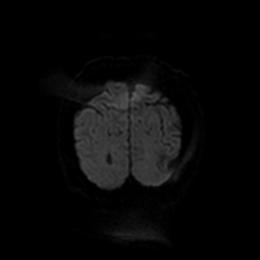
[im 72/72]
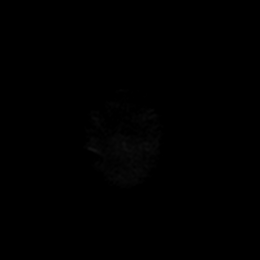

[Series 8: DWI · coronal · 4.0mm · 0.88mm/px · 3 of 36 slices shown (4 of 4)]
[im 1/36]
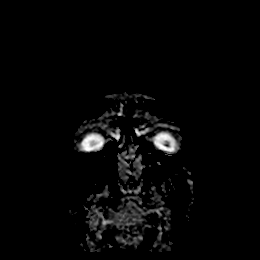
[im 18/36]
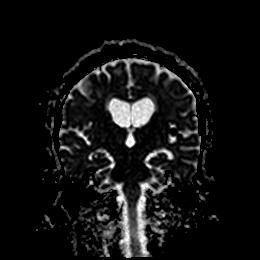
[im 36/36]
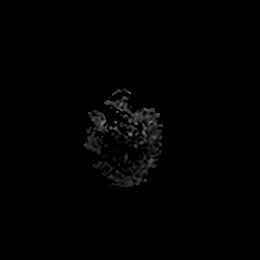

[Series 9: FLAIR · axial · 5.0mm · 0.45mm/px · z∈[-124,+19]mm · 2 of 25 slices shown]
[im 1/25]
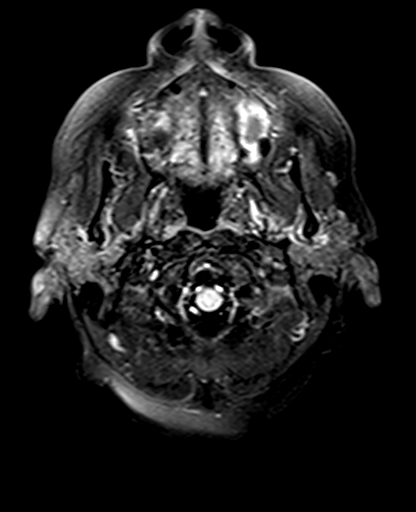
[im 25/25]
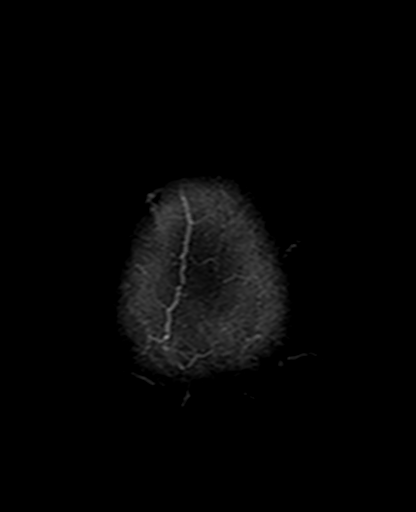

[Series 10: T1 · sagittal · 5.0mm · 0.75mm/px · 2 of 25 slices shown]
[im 1/25]
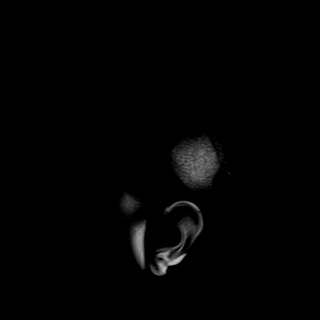
[im 25/25]
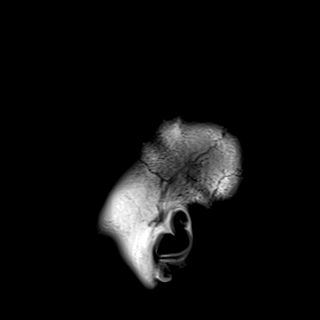

[Series 11: T2 · axial · 5.0mm · 0.72mm/px · z∈[-124,+19]mm · 2 of 25 slices shown (1 of 2)]
[im 1/25]
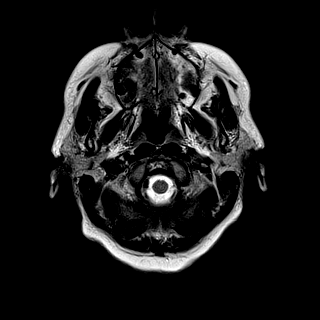
[im 25/25]
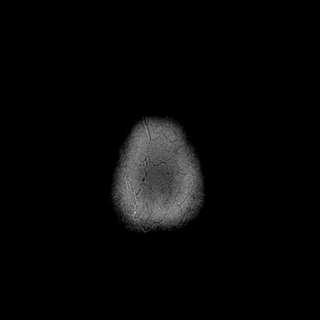

[Series 13: pha_images · axial · 3.0mm · 0.90mm/px · z∈[-132,-51]mm · 3 of 55 slices shown]
[im 1/55]
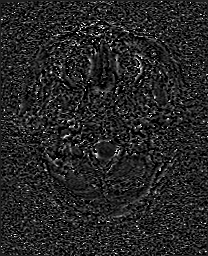
[im 14/55]
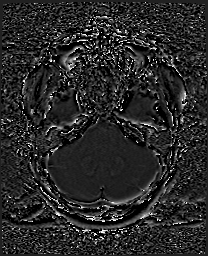
[im 28/55]
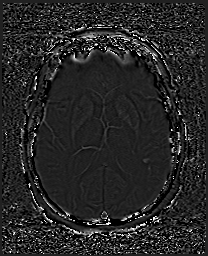

[Series 26: T2 · coronal · 5.0mm · 0.34mm/px · 3 of 29 slices shown (2 of 2)]
[im 1/29]
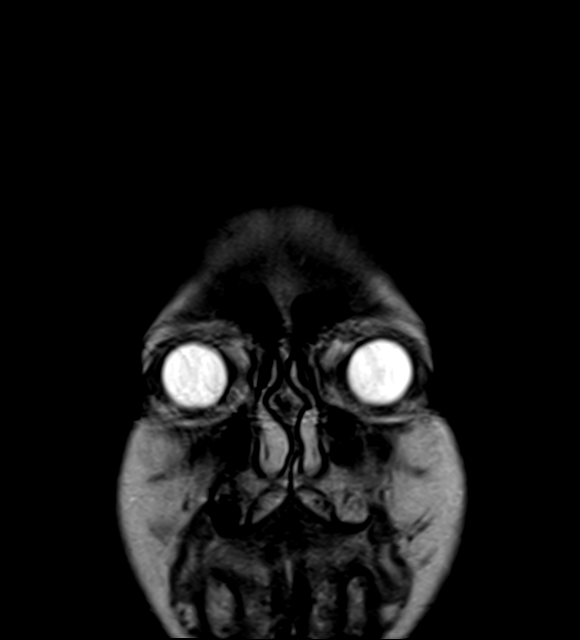
[im 15/29]
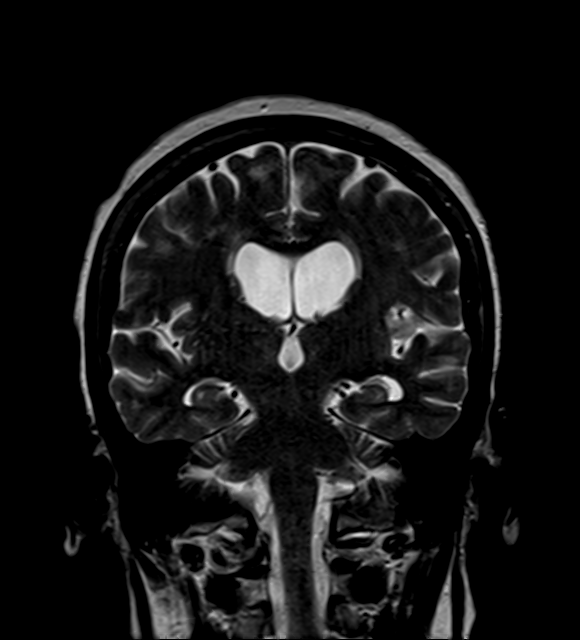
[im 29/29]
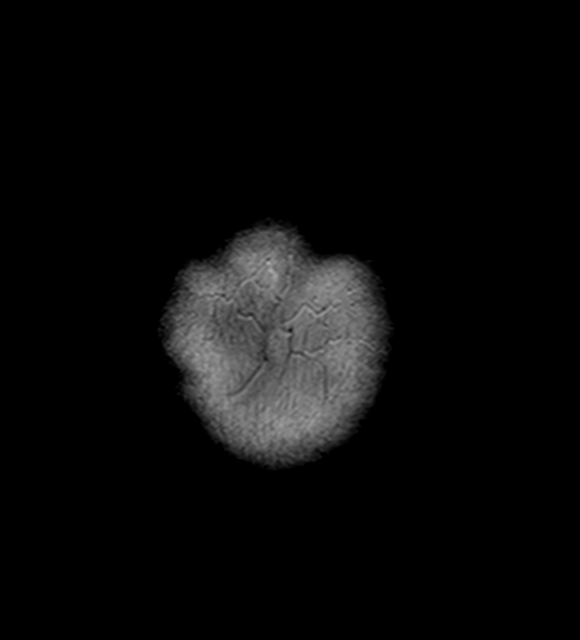

[36 of 48 positions shown; findings below may reference images not displayed]

FINDINGS: MRI HEAD FINDINGS

Brain: Generalized age-related cerebral atrophy. Patchy and
confluent T2/FLAIR hyperintensity within the periventricular deep
white matter both cerebral hemispheres most consistent with chronic
small vessel ischemic disease, moderate in nature. Encephalomalacia
and gliosis involving the left parietotemporal region consistent
with a chronic left MCA distribution infarct. Mild petechial
hemorrhage without frank hemorrhagic transformation. No significant
regional mass effect. No other evidence for acute or subacute
ischemia. Gray-white matter differentiation otherwise maintained. No
other areas of chronic cortical infarction.

No mass lesion, midline shift or mass effect. No hydrocephalus or
extra-axial fluid collection. Pituitary gland suprasellar region
normal. Midline structures intact.

Immediately anterior and adjacent to the chronic left MCA territory
infarct, there are scattered areas of restricted diffusion involving
the cortex of the left posterior frontotemporal region, consistent
with an acute left MCA territory infarct (series 5, image 77).
Associated

Vascular: Hypoplastic left vertebral artery not well seen. Major
intracranial vascular flow voids are otherwise maintained.

Skull and upper cervical spine: Craniocervical junction within
normal limits. Bone marrow signal intensity normal. No scalp soft
tissue abnormality.

Sinuses/Orbits: Globes and orbital soft tissues within normal
limits. Paranasal sinuses are largely clear. No mastoid effusion.
Inner ear structures grossly normal.

Other: None.

MRA HEAD FINDINGS

ANTERIOR CIRCULATION:

Visualized distal cervical segments of the internal carotid arteries
are patent with antegrade flow. Petrous, cavernous, and supraclinoid
segments patent without flow-limiting stenosis. A1 segments patent
bilaterally. Normal anterior communicating artery complex. Anterior
cerebral arteries patent to their distal aspects without stenosis.

Right M1 segment widely patent. Normal right MCA bifurcation. Distal
right MCA branches well perfused.

Left M1 segment patent proximally. There are severe stenoses
involving the distal left M1 segment, left MCA bifurcation, and
adjacent proximal M2 branches (series 8012, image 6). There appears
to be involvement of both superior and inferior divisions. Left MCA
branches perfused distally, with no definite proximal M2 branch
occlusion.

POSTERIOR CIRCULATION:

Right vertebral artery dominant and patent to the vertebrobasilar
junction. Left vertebral artery markedly hypoplastic and not well
visualized, possibly occluded. Right PICA origin patent. Focal
moderate stenosis at the vertebrobasilar junction (series 6222,
image 9). This is positioned at the takeoff of a dominant left AICA.
Extensive atheromatous irregularity within the left AICA. Basilar
widely patent distally. Superior cerebral arteries patent
bilaterally. Both PCAs supplied via a hypoplastic P1 segments and
robust bilateral posterior communicating arteries. Diffuse
atheromatous irregularity throughout the PCAs bilaterally without
proximal high-grade stenosis.

No intracranial aneurysm.

MRA NECK FINDINGS

AORTIC ARCH: Examination technically limited by motion and lack of
IV contrast.

Partially visualized aortic arch grossly within normal limits for
caliber. Normal branch pattern. No obvious high-grade stenosis about
the origin of the great vessels.

RIGHT CAROTID SYSTEM: Partially visualized right CCA patent without
stenosis. No significant atheromatous stenosis about the right
carotid bulb or proximal right ICA. Right ICA patent distally
without stenosis, evidence for dissection or occlusion.

LEFT CAROTID SYSTEM: Partially visualized left CCA patent without
appreciable stenosis. No significant atheromatous narrowing about
the left carotid bulb or proximal left ICA. Left ICA patent distally
without stenosis, evidence for dissection or occlusion.

VERTEBRAL ARTERIES: Both vertebral arteries appear to arise from the
subclavian arteries. Strongly dominant right vertebral artery patent
within the neck without appreciable stenosis or other vascular
abnormality. Left vertebral artery is markedly hypoplastic and not
well seen, possibly at least partially occluded.
IMPRESSION: MRI HEAD IMPRESSION:

1. Scattered acute ischemic cortical infarct involving the left
frontotemporal region. Associated mild petechial hemorrhage without
frank hemorrhagic transformation or significant mass effect.
2. Underlying chronic left MCA distribution infarct with moderate
chronic microvascular ischemic disease.

MRA HEAD IMPRESSION:

1. Negative MRA for large vessel occlusion.
2. Severe stenoses involving the distal left M1 segment, left MCA
bifurcation, and adjacent proximal M2 branches, with probable
involvement of both superior and inferior divisions.
3. Moderate stenosis at the vertebrobasilar junction.
4. Nonvisualization of the left vertebral artery.

MRA NECK IMPRESSION:

1. Technically limited exam by motion and lack of IV contrast.
2. Patency of both carotid artery systems within the neck without
appreciable stenosis or other acute vascular abnormality.
3. Strongly dominant right vertebral artery patent within the neck.
Hypoplastic left vertebral artery not well seen, and may be
occluded/partially occluded.

## 2021-09-04 ENCOUNTER — Ambulatory Visit
Admission: RE | Admit: 2021-09-04 | Discharge: 2021-09-04 | Disposition: A | Discharge status: Home / Self Care | Payer: 59 | Source: Ambulatory Visit | Attending: Family | Admitting: Family

## 2021-09-04 ENCOUNTER — Ambulatory Visit: Payer: 59

## 2021-09-04 DIAGNOSIS — Z1231 Encounter for screening mammogram for malignant neoplasm of breast: Secondary | ICD-10-CM

## 2021-10-23 ENCOUNTER — Telehealth: Payer: Self-pay

## 2021-10-23 ENCOUNTER — Ambulatory Visit (INDEPENDENT_AMBULATORY_CARE_PROVIDER_SITE_OTHER): Payer: 59

## 2021-10-23 VITALS — Ht 62.0 in | Wt 164.0 lb

## 2021-10-23 DIAGNOSIS — Z0000 Encounter for general adult medical examination without abnormal findings: Secondary | ICD-10-CM

## 2021-10-23 NOTE — Progress Notes (Signed)
Subjective:   Kara Mendoza L Mendoza is a 71 y.o. female who presents for Medicare Annual (Subsequent) preventive examination.  I connected with Kara Mendoza today by telephone and verified that I am speaking with the correct person using two identifiers. Location patient: home Location provider: work Persons participating in the virtual visit: patient, Engineer, civil (consulting)nurse.    I discussed the limitations, risks, security and privacy concerns of performing an evaluation and management service by telephone and the availability of in person appointments. I also discussed with the patient that there may be a patient responsible charge related to this service. The patient expressed understanding and verbally consented to this telephonic visit.    Interactive audio and video telecommunications were attempted between this provider and patient, however failed, due to patient having technical difficulties OR patient did not have access to video capability.  We continued and completed visit with audio only.  Some vital signs may be absent or patient reported.   Time Spent with patient on telephone encounter: 30 minutes   Review of Systems     Cardiac Risk Factors include: advanced age (>6355men, 24>65 women);hypertension;diabetes mellitus;dyslipidemia;sedentary lifestyle     Objective:    Today's Vitals   10/23/21 1134  Weight: 164 lb (74.4 kg)  Height: 5\' 2"  (1.575 m)  PainSc: 7    Body mass index is 30 kg/m.     10/23/2021   11:38 AM 04/14/2020   12:39 PM 01/29/2018    5:31 AM 02/22/2017    4:19 PM 11/02/2016    4:49 PM 06/22/2016    2:50 PM 02/21/2016    7:12 PM  Advanced Directives  Does Patient Have a Medical Advance Directive? No No No No No No No  Does patient want to make changes to medical advance directive?      Yes (ED - Information included in AVS)   Would patient like information on creating a medical advance directive?  Yes (MAU/Ambulatory/Procedural Areas - Information given) No - Patient declined No  - Patient declined   No - Patient declined    Current Medications (verified) Outpatient Encounter Medications as of 10/23/2021  Medication Sig   acetaminophen (TYLENOL) 500 MG tablet Take 2 tablets (1,000 mg total) by mouth every 6 (six) hours as needed.   albuterol (PROVENTIL HFA;VENTOLIN HFA) 108 (90 Base) MCG/ACT inhaler Inhale 2 puffs into the lungs every 6 (six) hours as needed for wheezing or shortness of breath.   atorvastatin (LIPITOR) 40 MG tablet Take 1 tablet (40 mg total) by mouth daily.   diclofenac Sodium (VOLTAREN) 1 % GEL Apply 2 g topically 4 (four) times daily.   DULoxetine (CYMBALTA) 60 MG capsule TAKE 1 CAPSULE(60 MG) BY MOUTH DAILY   fluticasone (FLONASE) 50 MCG/ACT nasal spray Place 1 spray into both nostrils daily.   gabapentin (NEURONTIN) 300 MG capsule TAKE 1 CAPSULE(300 MG) BY MOUTH THREE TIMES DAILY   Glucose Blood (BLOOD GLUCOSE TEST STRIPS) STRP Inject 1 Units into the skin 4 (four) times daily -  before meals and at bedtime. OneTouch Ultra mini strips   losartan-hydrochlorothiazide (HYZAAR) 100-25 MG tablet Take 1 tablet by mouth daily.   metFORMIN (GLUCOPHAGE) 500 MG tablet Take 1 tablet (500 mg total) by mouth daily with breakfast.   Multiple Vitamin (MULTIVITAMIN WITH MINERALS) TABS tablet Take 1 tablet by mouth daily.   omeprazole (PRILOSEC) 20 MG capsule TAKE 1 CAPSULE(20 MG) BY MOUTH TWICE DAILY   ONETOUCH DELICA LANCETS FINE MISC 1 Units by Does not apply route 4 (four)  times daily -  before meals and at bedtime.   oxyCODONE-acetaminophen (PERCOCET) 10-325 MG tablet Take 1 tablet by mouth every 6 (six) hours as needed for pain.   triamcinolone (KENALOG) 0.1 % Apply 1 application topically 2 (two) times daily.   No facility-administered encounter medications on file as of 10/23/2021.    Allergies (verified) Aspirin, Diclofenac, and Penicillins   History: Past Medical History:  Diagnosis Date   Abnormality of gait    Acute bronchitis    Allergy     Anxiety    Anxiety state, unspecified    Asthma    Chronic arm pain 2008   left   Chronic headache    Chronic leg pain 2008   left   Chronic pain syndrome    Constipation    Diabetes mellitus (HCC)    Dysarthria    Esophageal reflux    Fatigue    History of fall    Hyperlipidemia    Hypertension    Hyperthyroidism    LOOP Recorder 10/14/2013   Major depressive disorder, recurrent episode, moderate (HCC)    Memory loss    Neuromuscular disorder (HCC)    Osteoarthritis    Stroke (HCC)    Tobacco use disorder    Past Surgical History:  Procedure Laterality Date   CESAREAN SECTION     x3   FINGER SURGERY     LOOP RECORDER IMPLANT  08-12-2013   MDT LINQ implanted by Dr Graciela Husbands for cryptogenic stroke   LOOP RECORDER IMPLANT N/A 08/12/2013   Procedure: LOOP RECORDER IMPLANT;  Surgeon: Duke Salvia, MD;  Location: [facility] CATH LAB;  Service: Cardiovascular;  Laterality: N/A;   TEE WITHOUT CARDIOVERSION N/A 08/12/2013   Procedure: TRANSESOPHAGEAL ECHOCARDIOGRAM (TEE);  Surgeon: Wendall Stade, MD;  Location: [facility] ENDOSCOPY;  Service: Cardiovascular;  Laterality: N/A;   Family History  Problem Relation Age of Onset   Cancer Father    Diabetes Sister    Diabetes Brother    Social History   Socioeconomic History   Marital status: Single    Spouse name: Not on file   Number of children: 3   Years of education: College   Highest education level: Some college, no degree  Occupational History    Employer: OTHER    Comment: Disabled  Tobacco Use   Smoking status: Light Smoker    Packs/day: 0.20    Years: 15.00    Total pack years: 3.00    Types: Cigarettes   Smokeless tobacco: Never   Tobacco comments:     smoking 3-4 a day  Substance and Sexual Activity   Alcohol use: No    Alcohol/week: 0.0 standard drinks of alcohol   Drug use: No   Sexual activity: Not on file  Other Topics Concern   Not on file  Social History Narrative   Patient is single with 3 children.   Patient is  right handed.   Patient has college education.   Patient drinks 2 cups daily.      Patient lives in 2nd floor apartment. Has 17 steps to climb.    Social Determinants of Health   Financial Resource Strain: Low Risk  (04/14/2020)   Overall Financial Resource Strain (CARDIA)    Difficulty of Paying Living Expenses: Not hard at all  Food Insecurity: No Food Insecurity (10/23/2021)   Hunger Vital Sign    Worried About Running Out of Food in the Last Year: Never true    Ran Out of Food in  the Last Year: Never true  Transportation Needs: No Transportation Needs (10/23/2021)   PRAPARE - Hydrologist (Medical): No    Lack of Transportation (Non-Medical): No  Physical Activity: Inactive (04/14/2020)   Exercise Vital Sign    Days of Exercise per Week: 0 days    Minutes of Exercise per Session: 0 min  Stress: No Stress Concern Present (10/23/2021)   [place]    Feeling of Stress : Not at all  Social Connections: Socially Isolated (10/23/2021)   Social Connection and Isolation Panel [NHANES]    Frequency of Communication with Friends and Family: More than three times a week    Frequency of Social Gatherings with Friends and Family: Once a week    Attends Religious Services: Never    Marine scientist or Organizations: No    Attends Music therapist: Never    Marital Status: Never married    Tobacco Counseling Ready to quit: Not Answered Counseling given: Not Answered Tobacco comments:  smoking 3-4 a day   Clinical Intake:  Pre-visit preparation completed: Yes  Pain : 0-10 Pain Score: 7  Pain Type: Neuropathic pain Pain Location: Hand (and head) Pain Onset: More than a month ago Pain Frequency: Constant     BMI - recorded: 30 Nutritional Status: BMI > 30  Obese Nutritional Risks: None Diabetes: Yes CBG done?: No Did pt. bring in CBG monitor from home?: No (phone  visit)  How often do you need to have someone help you when you read instructions, pamphlets, or other written materials from your doctor or pharmacy?: 1 - Never Diabetes:  Is the patient diabetic?  Yes  If diabetic, was a CBG obtained today?  No  Did the patient bring in their glucometer from home?  No phone visit How often do you monitor your CBG's? weekly.   Financial Strains and Diabetes Management:  Are you having any financial strains with the device, your supplies or your medication? No .  Does the patient want to be seen by Chronic Care Management for management of their diabetes?  No  Would the patient like to be referred to a Nutritionist or for Diabetic Management?  No   Diabetic Exams:  Diabetic Eye Exam: Overdue for diabetic eye exam. Pt has been advised about the importance in completing this exam.   Diabetic Foot Exam: Pt has been advised about the importance in completing this exam. To be completed by PCP  Interpreter Needed?: No  Information entered by :: Caroleen Hamman LPN   Activities of Daily Living    10/23/2021   11:45 AM  In your present state of health, do you have any difficulty performing the following activities:  Hearing? 1  Vision? 0  Difficulty concentrating or making decisions? 0  Walking or climbing stairs? 1  Dressing or bathing? 1  Doing errands, shopping? 1  Preparing Food and eating ? Y  Using the Toilet? N  In the past six months, have you accidently leaked urine? N  Do you have problems with loss of bowel control? N  Managing your Medications? N  Managing your Finances? N  Housekeeping or managing your Housekeeping? Y    Patient Care Team: Marrian Salvage, FNP as PCP - General (Internal Medicine)  Indicate any recent Medical Services you may have received from other than [facility] providers in the past year (date may be approximate).     Assessment:  This is a routine wellness examination for [place].  Hearing/Vision  screen Hearing Screening - Comments:: Pt c/o hearing loss Vision Screening - Comments:: Last eye exam-about a year ago  Dietary issues and exercise activities discussed: Current Exercise Habits: The patient does not participate in regular exercise at present, Exercise limited by: None identified   Goals Addressed             This Visit's Progress    Prevent Falls   On track    Quit smoking / using tobacco   Not on track      Depression Screen    10/23/2021   11:44 AM 08/11/2021    2:13 PM 04/14/2020    1:03 PM 11/23/2016    4:50 PM 11/23/2016    3:47 PM 06/22/2016    2:55 PM 02/21/2016    7:30 PM  PHQ 2/9 Scores  PHQ - 2 Score 0 0 0 PHQ- 9 Score    Fall Risk    10/23/2021   11:39 AM 08/11/2021    2:13 PM 07/28/2020    3:35 PM 04/14/2020    1:04 PM 06/20/2017   10:16 AM  Fall Risk   Falls in the past year? 0 0 1 0 No  Number falls in past yr: 0 0 1 0   Injury with Fall? 0 0 1 0   Risk for fall due to :   Impaired balance/gait;Impaired mobility;Mental status change No Fall Risks   Follow up Falls prevention discussed  Falls evaluation completed Falls evaluation completed     FALL RISK PREVENTION PERTAINING TO THE HOME:  Any stairs in or around the home? Yes  If so, are there any without handrails? No  Home free of loose throw rugs in walkways, pet beds, electrical cords, etc? No pt advised of falls risk Adequate lighting in your home to reduce risk of falls? Yes   ASSISTIVE DEVICES UTILIZED TO PREVENT FALLS:  Life alert? Yes  Use of a cane, walker or w/c? Yes  Grab bars in the bathroom? No  Shower chair or bench in shower? Yes  Elevated toilet seat or a handicapped toilet? No   TIMED UP AND GO:  Was the test performed? No . Phone visit   Cognitive Function:    06/22/2016    3:16 PM  MMSE - Mini Mental State Exam  Orientation to time 5  Orientation to Place 5  Registration 3  Attention/ Calculation 5  Recall 3  Language- name 2 objects  2  Language- repeat 1  Language- follow 3 step command 3  Language- read & follow direction 1  Write a sentence 1  Copy design 1  Total score 30        10/23/2021   11:53 AM  6CIT Screen  What Year? 0 points  What month? 0 points  What time? 3 points  Count back from 20 0 points  Months in reverse 4 points  Repeat phrase 0 points  Total Score 7 points    Immunizations  There is no immunization history on file for this patient.  TDAP status: Due, Education has been provided regarding the importance of this vaccine. Advised may receive this vaccine at local pharmacy or Health Dept. Aware to provide a copy of the vaccination record if obtained from local pharmacy or Health Dept. Verbalized acceptance and understanding.  Flu Vaccine status: Declined, Education has been provided regarding the importance  of this vaccine but patient still declined. Advised may receive this vaccine at local pharmacy or Health Dept. Aware to provide a copy of the vaccination record if obtained from local pharmacy or Health Dept. Verbalized acceptance and understanding.  Pneumococcal vaccine status: Due, Education has been provided regarding the importance of this vaccine. Advised may receive this vaccine at local pharmacy or Health Dept. Aware to provide a copy of the vaccination record if obtained from local pharmacy or Health Dept. Verbalized acceptance and understanding.  Covid-19 vaccine status: Information provided on how to obtain vaccines.   Qualifies for Shingles Vaccine? Yes   Zostavax completed No   Shingrix Completed?: No.    Education has been provided regarding the importance of this vaccine. Patient has been advised to call insurance company to determine out of pocket expense if they have not yet received this vaccine. Advised may also receive vaccine at local pharmacy or Health Dept. Verbalized acceptance and understanding.  Screening Tests Health Maintenance  Topic Date Due   Pneumonia  Vaccine 70+ Years old (1 - PCV) Never done   Hepatitis C Screening  Never done   Zoster Vaccines- Shingrix (1 of 2) Never done   Diabetic kidney evaluation - Urine ACR  08/02/2016   FOOT EXAM  04/04/2017   TETANUS/TDAP  06/25/2018   OPHTHALMOLOGY EXAM  11/14/2018   INFLUENZA VACCINE  09/05/2021   HEMOGLOBIN A1C  02/11/2022   Diabetic kidney evaluation - GFR measurement  08/12/2022   Fecal DNA (Cologuard)  12/01/2022   MAMMOGRAM  09/05/2023   DEXA SCAN  Completed   HPV VACCINES  Aged Out   COVID-19 Vaccine  Discontinued    Health Maintenance  Health Maintenance Due  Topic Date Due   Pneumonia Vaccine 55+ Years old (1 - PCV) Never done   Hepatitis C Screening  Never done   Zoster Vaccines- Shingrix (1 of 2) Never done   Diabetic kidney evaluation - Urine ACR  08/02/2016   FOOT EXAM  04/04/2017   TETANUS/TDAP  06/25/2018   OPHTHALMOLOGY EXAM  11/14/2018   INFLUENZA VACCINE  09/05/2021    Colorectal cancer screening: Type of screening: Cologuard. Completed 12/01/2019. Repeat every 3 years  Mammogram status: Completed bilateral 09/04/2021. Repeat every year  Bone Density status: Due-Declined  Lung Cancer Screening: (Low Dose CT Chest recommended if Age 29-80 years, 30 pack-year currently smoking OR have quit w/in 15years.) does not qualify.     Additional Screening:  Hepatitis C Screening: does qualify; Patient to discuss with PCP at next office visit  Vision Screening: Recommended annual ophthalmology exams for early detection of glaucoma and other disorders of the eye. Is the patient up to date with their annual eye exam?  Yes  Who is the provider or what is the name of the office in which the patient attends annual eye exams? Pt unsure   Dental Screening: Recommended annual dental exams for proper oral hygiene  Community Resource Referral / Chronic Care Management: CRR required this visit?  Yes for utility assistance  CCM required this visit?  No      Plan:      I have personally reviewed and noted the following in the patient's chart:   Medical and social history Use of alcohol, tobacco or illicit drugs  Current medications and supplements including opioid prescriptions. Patient is currently taking opioid prescriptions. Information provided to patient regarding non-opioid alternatives. Patient advised to discuss non-opioid treatment plan with their provider. Functional ability and status Nutritional status Physical  activity Advanced directives List of other physicians Hospitalizations, surgeries, and ER visits in previous 12 months Vitals Screenings to include cognitive, depression, and falls Referrals and appointments  In addition, I have reviewed and discussed with patient certain preventive protocols, quality metrics, and best practice recommendations. A written personalized care plan for preventive services as well as general preventive health recommendations were provided to patient.   Due to this being a telephonic visit, the after visit summary with patients personalized plan was offered to patient via mail or my-chart. Patient to pick up at office at next visit.   Roanna Raider, LPN   [id number]/[id number]  Nurse Health Advisor  Nurse Notes: None

## 2021-10-23 NOTE — Patient Instructions (Signed)
[name] , Thank you for taking time to complete your Medicare Wellness Visit. I appreciate your ongoing commitment to your health goals. Please review the following plan we discussed and let me know if I can assist you in the future.   Screening recommendations/referrals: Colonoscopy: Completed Cologuard 12/01/2019-Due 12/01/2022 Mammogram: Completed 09/04/2021-Due 09/05/2022 Bone Density: Due-Declined today Recommended yearly ophthalmology/optometry visit for glaucoma screening and checkup Recommended yearly dental visit for hygiene and checkup  Vaccinations: Declined all vaccines   Advanced directives: May pick up information at your next visit  Conditions/risks identified: See problem list  Next appointment: Follow up in one year for your annual wellness visit    Preventive Care 72 Years and Older, Female Preventive care refers to lifestyle choices and visits with your health care provider that can promote health and wellness. What does preventive care include? A yearly physical exam. This is also called an annual well check. Dental exams once or twice a year. Routine eye exams. Ask your health care provider how often you should have your eyes checked. Personal lifestyle choices, including: Daily care of your teeth and gums. Regular physical activity. Eating a healthy diet. Avoiding tobacco and drug use. Limiting alcohol use. Practicing safe sex. Taking low-dose aspirin every day. Taking vitamin and mineral supplements as recommended by your health care provider. What happens during an annual well check? The services and screenings done by your health care provider during your annual well check will depend on your age, overall health, lifestyle risk factors, and family history of disease. Counseling  Your health care provider may ask you questions about your: Alcohol use. Tobacco use. Drug use. Emotional well-being. Home and relationship well-being. Sexual  activity. Eating habits. History of falls. Memory and ability to understand (cognition). Work and work Statistician. Reproductive health. Screening  You may have the following tests or measurements: Height, weight, and BMI. Blood pressure. Lipid and cholesterol levels. These may be checked every 5 years, or more frequently if you are over 79 years old. Skin check. Lung cancer screening. You may have this screening every year starting at age 52 if you have a 30-pack-year history of smoking and currently smoke or have quit within the past 15 years. Fecal occult blood test (FOBT) of the stool. You may have this test every year starting at age 14. Flexible sigmoidoscopy or colonoscopy. You may have a sigmoidoscopy every 5 years or a colonoscopy every 10 years starting at age 51. Hepatitis C blood test. Hepatitis B blood test. Sexually transmitted disease (STD) testing. Diabetes screening. This is done by checking your blood sugar (glucose) after you have not eaten for a while (fasting). You may have this done every 1-3 years. Bone density scan. This is done to screen for osteoporosis. You may have this done starting at age 50. Mammogram. This may be done every 1-2 years. Talk to your health care provider about how often you should have regular mammograms. Talk with your health care provider about your test results, treatment options, and if necessary, the need for more tests. Vaccines  Your health care provider may recommend certain vaccines, such as: Influenza vaccine. This is recommended every year. Tetanus, diphtheria, and acellular pertussis (Tdap, Td) vaccine. You may need a Td booster every 10 years. Zoster vaccine. You may need this after age 67. Pneumococcal 13-valent conjugate (PCV13) vaccine. One dose is recommended after age 38. Pneumococcal polysaccharide (PPSV23) vaccine. One dose is recommended after age 7. Talk to your health care provider about which screenings  and vaccines  you need and how often you need them. This information is not intended to replace advice given to you by your health care provider. Make sure you discuss any questions you have with your health care provider. Document Released: 02/18/2015 Document Revised: 10/12/2015 Document Reviewed: 11/23/2014 Elsevier Interactive Patient Education  2017 [employer] Prevention in the Home Falls can cause injuries. They can happen to people of all ages. There are many things you can do to make your home safe and to help prevent falls. What can I do on the outside of my home? Regularly fix the edges of walkways and driveways and fix any cracks. Remove anything that might make you trip as you walk through a door, such as a raised step or threshold. Trim any bushes or trees on the path to your home. Use bright outdoor lighting. Clear any walking paths of anything that might make someone trip, such as rocks or tools. Regularly check to see if handrails are loose or broken. Make sure that both sides of any steps have handrails. Any raised decks and porches should have guardrails on the edges. Have any leaves, snow, or ice cleared regularly. Use sand or salt on walking paths during winter. Clean up any spills in your garage right away. This includes oil or grease spills. What can I do in the bathroom? Use night lights. Install grab bars by the toilet and in the tub and shower. Do not use towel bars as grab bars. Use non-skid mats or decals in the tub or shower. If you need to sit down in the shower, use a plastic, non-slip stool. Keep the floor dry. Clean up any water that spills on the floor as soon as it happens. Remove soap buildup in the tub or shower regularly. Attach bath mats securely with double-sided non-slip rug tape. Do not have throw rugs and other things on the floor that can make you trip. What can I do in the bedroom? Use night lights. Make sure that you have a light by your bed that  is easy to reach. Do not use any sheets or blankets that are too big for your bed. They should not hang down onto the floor. Have a firm chair that has side arms. You can use this for support while you get dressed. Do not have throw rugs and other things on the floor that can make you trip. What can I do in the kitchen? Clean up any spills right away. Avoid walking on wet floors. Keep items that you use a lot in easy-to-reach places. If you need to reach something above you, use a strong step stool that has a grab bar. Keep electrical cords out of the way. Do not use floor polish or wax that makes floors slippery. If you must use wax, use non-skid floor wax. Do not have throw rugs and other things on the floor that can make you trip. What can I do with my stairs? Do not leave any items on the stairs. Make sure that there are handrails on both sides of the stairs and use them. Fix handrails that are broken or loose. Make sure that handrails are as long as the stairways. Check any carpeting to make sure that it is firmly attached to the stairs. Fix any carpet that is loose or worn. Avoid having throw rugs at the top or bottom of the stairs. If you do have throw rugs, attach them to the floor with carpet tape.  Make sure that you have a light switch at the top of the stairs and the bottom of the stairs. If you do not have them, ask someone to add them for you. What else can I do to help prevent falls? Wear shoes that: Do not have high heels. Have rubber bottoms. Are comfortable and fit you well. Are closed at the toe. Do not wear sandals. If you use a stepladder: Make sure that it is fully opened. Do not climb a closed stepladder. Make sure that both sides of the stepladder are locked into place. Ask someone to hold it for you, if possible. Clearly mark and make sure that you can see: Any grab bars or handrails. First and last steps. Where the edge of each step is. Use tools that help you  move around (mobility aids) if they are needed. These include: Canes. Walkers. Scooters. Crutches. Turn on the lights when you go into a dark area. Replace any light bulbs as soon as they burn out. Set up your furniture so you have a clear path. Avoid moving your furniture around. If any of your floors are uneven, fix them. If there are any pets around you, be aware of where they are. Review your medicines with your doctor. Some medicines can make you feel dizzy. This can increase your chance of falling. Ask your doctor what other things that you can do to help prevent falls. This information is not intended to replace advice given to you by your health care provider. Make sure you discuss any questions you have with your health care provider. Document Released: 11/18/2008 Document Revised: 06/30/2015 Document Reviewed: 02/26/2014 Elsevier Interactive Patient Education  2017 [employer].

## 2021-10-23 NOTE — Telephone Encounter (Signed)
Patient is requesting an order for an elevated toilet seat & a motorized W/C. She has an upcoming appt in October & I advised her to speak with you about these items at that time.

## 2021-11-10 ENCOUNTER — Ambulatory Visit: Payer: 59 | Admitting: Family

## 2021-11-16 ENCOUNTER — Encounter: Payer: Self-pay | Admitting: Family

## 2021-11-16 ENCOUNTER — Ambulatory Visit (INDEPENDENT_AMBULATORY_CARE_PROVIDER_SITE_OTHER): Payer: 59 | Admitting: Family

## 2021-11-16 VITALS — BP 122/84 | HR 95 | Temp 98.8°F | Resp 18 | Ht 62.0 in | Wt 159.0 lb

## 2021-11-16 DIAGNOSIS — E1142 Type 2 diabetes mellitus with diabetic polyneuropathy: Secondary | ICD-10-CM

## 2021-11-16 NOTE — Progress Notes (Signed)
Kara Mendoza is a 71 y.o. female with the following history as recorded in EpicCare:  Patient Active Problem List   Diagnosis Date Noted   Acute CVA (cerebrovascular accident) ([place]) 07/15/2020   Dysuria 05/29/2018   Residual cognitive deficit as late effect of stroke 04/26/2016   Hemiparesis affecting left side as late effect of cerebrovascular accident (CVA) ([place]) 04/26/2016   Carpal tunnel syndrome on left 11/29/2015   DDD (degenerative disc disease), cervical 11/29/2015   Depression 11/29/2015   Chronic pain of left knee 09/04/2015   Headache 09/04/2015   Paroxysmal atrial fibrillation ([place]) 07/22/2015   Complex regional pain syndrome type 2 of upper extremity 11/05/2013   Carpal tunnel syndrome on right 10/22/2013   Chronic embolism and thrombosis of deep vein of left upper extremity ([place]) 10/22/2013   Cardiac device in situ 10/14/2013   Essential hypertension 09/30/2013   Complex regional pain syndrome type 1 of both upper extremities 09/30/2013   Diabetes mellitus ([place]) 09/28/2013   Hyperthyroidism 09/28/2013   Median nerve neuritis 09/28/2013   Ataxia, late effect of cerebrovascular disease 09/28/2013   Spastic hemiplegia affecting dominant side ([place]) 09/28/2013   CVA (cerebral vascular accident) ([place]) 08/15/2013   Cerebral embolism with cerebral infarction ([place]) 08/09/2013   Dysarthria 08/09/2013   Abnormality of gait 05/01/2012   Incontinence of urine 05/01/2012   Tobacco abuse 01/28/2012   Tobacco dependence syndrome 01/28/2012   Plantar fasciitis 09/01/2010   Allergic rhinitis 07/11/2010   Persistent cough 06/16/2010   Asthma night-time symptoms 06/14/2010   GERD (gastroesophageal reflux disease) 05/26/2010   Stroke ([place]) 05/26/2010   Hyperlipidemia 05/26/2010   Osteoarthritis 05/26/2010   Mononeuritis 05/26/2010    Current Outpatient Medications  Medication Sig Dispense Refill   acetaminophen (TYLENOL) 500 MG tablet Take 2 tablets (1,000 mg total) by mouth  every 6 (six) hours as needed. 30 tablet 0   albuterol (PROVENTIL HFA;VENTOLIN HFA) 108 (90 Base) MCG/ACT inhaler Inhale 2 puffs into the lungs every 6 (six) hours as needed for wheezing or shortness of breath.     atorvastatin (LIPITOR) 40 MG tablet Take 1 tablet (40 mg total) by mouth daily. 90 tablet 3   diclofenac Sodium (VOLTAREN) 1 % GEL Apply 2 g topically 4 (four) times daily. 100 g 1   DULoxetine (CYMBALTA) 60 MG capsule TAKE 1 CAPSULE(60 MG) BY MOUTH DAILY 90 capsule 3   fluticasone (FLONASE) 50 MCG/ACT nasal spray Place 1 spray into both nostrils daily. 16 g 6   gabapentin (NEURONTIN) 300 MG capsule TAKE 1 CAPSULE(300 MG) BY MOUTH THREE TIMES DAILY 270 capsule 2   Glucose Blood (BLOOD GLUCOSE TEST STRIPS) STRP Inject 1 Units into the skin 4 (four) times daily -  before meals and at bedtime. OneTouch Ultra mini strips 100 each 3   losartan-hydrochlorothiazide (HYZAAR) 100-25 MG tablet Take 1 tablet by mouth daily. 90 tablet 3   metFORMIN (GLUCOPHAGE) 500 MG tablet Take 1 tablet (500 mg total) by mouth daily with breakfast. 90 tablet 3   Multiple Vitamin (MULTIVITAMIN WITH MINERALS) TABS tablet Take 1 tablet by mouth daily.     omeprazole (PRILOSEC) 20 MG capsule TAKE 1 CAPSULE(20 MG) BY MOUTH TWICE DAILY 180 capsule 3   ONETOUCH DELICA LANCETS FINE MISC 1 Units by Does not apply route 4 (four) times daily -  before meals and at bedtime. 100 each 3   oxyCODONE-acetaminophen (PERCOCET) 10-325 MG tablet Take 1 tablet by mouth every 6 (six) hours as needed for pain.  triamcinolone (KENALOG) 0.1 % Apply 1 application topically 2 (two) times daily. 45 g 0   No current facility-administered medications for this visit.    Allergies: Aspirin, Diclofenac, and Penicillins  Past Medical History:  Diagnosis Date   Abnormality of gait    Acute bronchitis    Allergy    Anxiety    Anxiety state, unspecified    Asthma    Chronic arm pain 2008   left   Chronic headache    Chronic leg pain  2008   left   Chronic pain syndrome    Constipation    Diabetes mellitus ([place])    Dysarthria    Esophageal reflux    Fatigue    History of fall    Hyperlipidemia    Hypertension    Hyperthyroidism    LOOP Recorder 10/14/2013   Major depressive disorder, recurrent episode, moderate (HCC)    Memory loss    Neuromuscular disorder ([place])    Osteoarthritis    Stroke ([place])    Tobacco use disorder     Past Surgical History:  Procedure Laterality Date   CESAREAN SECTION     x3   FINGER SURGERY     LOOP RECORDER IMPLANT  08-12-2013   MDT LINQ implanted by Dr Caryl Comes for cryptogenic stroke   LOOP RECORDER IMPLANT N/A 08/12/2013   Procedure: LOOP RECORDER IMPLANT;  Surgeon: Deboraha Sprang, MD;  [facility] C[facility] LAB;  Service: Cardiovascular;  Laterality: N/A;   TEE WITHOUT CARDIOVERSION N/A 08/12/2013   Procedure: TRANSESOPHAGEAL ECHOCARDIOGRAM (TEE);  Surgeon: Josue Hector, MD;  Location: [facility] ENDOSCOPY;  Service: Cardiovascular;  Laterality: N/A;    Family History  Problem Relation Age of Onset   Cancer Father    Diabetes Sister    Diabetes Brother     Social History   Tobacco Use   Smoking status: Light Smoker    Packs/day: 0.20    Years: 15.00    Total pack years: 3.00    Types: Cigarettes   Smokeless tobacco: Never   Tobacco comments:     smoking 3-4 a day  Substance Use Topics   Alcohol use: No    Alcohol/week: 0.0 standard drinks of alcohol    Subjective:  3 month follow up on Type 2 Diabetes; still smoking but not ready to quit;  Patient defers any type of vaccines today- asked they vaccines removed from health history today;  Working with pain management for chronic hand pain/ neuropathy;      Objective:  Vitals:   11/16/21 1421  BP: 122/84  Pulse: 95  Resp: 18  Temp: 98.8 F (37.1 C)  TempSrc: Oral  SpO2: 98%  Weight: 159 lb (72.1 kg)  Height: 5' 2"  (1.575 m)    General: Well developed, well nourished, in no acute distress  Skin : Warm and dry.  Head:  Normocephalic and atraumatic  Lungs: Respirations unlabored; clear to auscultation bilaterally without wheeze, rales, rhonchi  CVS exam: normal rate, regular rhythm, normal S1, S2, no murmurs, rubs, clicks or gallops, normal rate and regular rhythm.  Neurologic: Alert and oriented; speech intact; face symmetrical; moves all extremities well; CNII-XII intact without focal deficit   Assessment:  1. Type 2 diabetes mellitus with diabetic polyneuropathy, without long-term current use of insulin ([place])     Plan:  Will update labs today; refills are up to date as well; she defers updating any type of vaccines and wants them removed from her health maintenance; encouraged to quit  smoking.   Return in about 6 months (around 05/18/2022).  Orders Placed This Encounter  Procedures   CBC with Differential/Platelet   Comp Met (CMET)   Hemoglobin A1c    Requested Prescriptions    No prescriptions requested or ordered in this encounter

## 2021-11-17 ENCOUNTER — Other Ambulatory Visit (INDEPENDENT_AMBULATORY_CARE_PROVIDER_SITE_OTHER): Payer: 59

## 2021-11-17 ENCOUNTER — Other Ambulatory Visit: Payer: Self-pay | Admitting: Family

## 2021-11-17 LAB — COMPREHENSIVE METABOLIC PANEL
ALT: 14 U/L (ref 0–35)
AST: 10 U/L (ref 0–37)
Albumin: 4.2 g/dL (ref 3.5–5.2)
Alkaline Phosphatase: 73 U/L (ref 39–117)
BUN: 10 mg/dL (ref 6–23)
CO2: 31 mEq/L (ref 19–32)
Calcium: 11 mg/dL — ABNORMAL HIGH (ref 8.4–10.5)
Chloride: 101 mEq/L (ref 96–112)
Creatinine, Ser: 1.13 mg/dL (ref 0.40–1.20)
GFR: 48.54 mL/min — ABNORMAL LOW (ref >60.00)
Glucose, Bld: 144 mg/dL — ABNORMAL HIGH (ref 70–99)
Potassium: 4.5 mEq/L (ref 3.5–5.1)
Sodium: 139 mEq/L (ref 135–145)
Total Bilirubin: 0.7 mg/dL (ref 0.2–1.2)
Total Protein: 6.8 g/dL (ref 6.0–8.3)

## 2021-11-17 LAB — CBC WITH DIFFERENTIAL/PLATELET
Basophils Absolute: 0.1 10*3/uL (ref 0.0–0.1)
Basophils Relative: 1 % (ref 0.0–3.0)
Eosinophils Absolute: 0.1 10*3/uL (ref 0.0–0.7)
Eosinophils Relative: 1 % (ref 0.0–5.0)
HCT: 45.5 % (ref 36.0–46.0)
Hemoglobin: 15.1 g/dL — ABNORMAL HIGH (ref 12.0–15.0)
Lymphocytes Relative: 32.2 % (ref 12.0–46.0)
Lymphs Abs: 3 10*3/uL (ref 0.7–4.0)
MCHC: 33.2 g/dL (ref 30.0–36.0)
MCV: 91.1 fl (ref 78.0–100.0)
Monocytes Absolute: 0.6 10*3/uL (ref 0.1–1.0)
Monocytes Relative: 6.6 % (ref 3.0–12.0)
Neutro Abs: 5.5 10*3/uL (ref 1.4–7.7)
Neutrophils Relative %: 59.2 % (ref 43.0–77.0)
Platelets: 364 10*3/uL (ref 150.0–400.0)
RBC: 5 Mil/uL (ref 3.87–5.11)
RDW: 13.2 % (ref 11.5–15.5)
WBC: 9.3 10*3/uL (ref 4.0–10.5)

## 2021-11-17 LAB — B12 AND FOLATE PANEL
Folate: 20.4 ng/mL (ref >5.9)
Vitamin B-12: 608 pg/mL (ref 211–911)

## 2021-11-17 LAB — HEMOGLOBIN A1C: Hgb A1c MFr Bld: 6.2 % (ref 4.6–6.5)

## 2021-11-20 LAB — PTH, INTACT AND CALCIUM: PTH: 34 pg/mL (ref 16–77)

## 2021-11-21 ENCOUNTER — Other Ambulatory Visit: Payer: 59

## 2022-03-30 ENCOUNTER — Ambulatory Visit: Payer: 59 | Attending: Internal Medicine

## 2022-03-30 ENCOUNTER — Ambulatory Visit: Payer: 59 | Admitting: Occupational Therapy

## 2022-03-30 ENCOUNTER — Other Ambulatory Visit: Payer: Self-pay

## 2022-03-30 DIAGNOSIS — M6281 Muscle weakness (generalized): Secondary | ICD-10-CM

## 2022-03-30 DIAGNOSIS — R2681 Unsteadiness on feet: Secondary | ICD-10-CM

## 2022-03-30 DIAGNOSIS — M79642 Pain in left hand: Secondary | ICD-10-CM

## 2022-03-30 DIAGNOSIS — R278 Other lack of coordination: Secondary | ICD-10-CM | POA: Insufficient documentation

## 2022-03-30 DIAGNOSIS — M79641 Pain in right hand: Secondary | ICD-10-CM

## 2022-03-30 DIAGNOSIS — I69354 Hemiplegia and hemiparesis following cerebral infarction affecting left non-dominant side: Secondary | ICD-10-CM

## 2022-03-30 DIAGNOSIS — R262 Difficulty in walking, not elsewhere classified: Secondary | ICD-10-CM | POA: Diagnosis present

## 2022-03-30 NOTE — Therapy (Signed)
OUTPATIENT OCCUPATIONAL THERAPY NEURO EVALUATION  Patient Name: Kara Mendoza MRN: [id number] DOB:[birth year], 72 y.o., female Today's Date: 03/30/2022  PCP: Loni Beckwith, MD REFERRING PROVIDER: Loni Beckwith, MD  END OF SESSION:  OT End of Session - 03/30/22 1222     Visit Number 1    Number of Visits 17    Date for OT Re-Evaluation 05/25/22    Authorization Type [employer] Medicare    OT Start Time 1005    OT Stop Time 1050    OT Time Calculation (min) 45 min    Activity Tolerance Patient tolerated treatment well             Past Medical History:  Diagnosis Date   Abnormality of gait    Acute bronchitis    Allergy    Anxiety    Anxiety state, unspecified    Asthma    Chronic arm pain 2008   left   Chronic headache    Chronic leg pain 2008   left   Chronic pain syndrome    Constipation    Diabetes mellitus ([place])    Dysarthria    Esophageal reflux    Fatigue    History of fall    Hyperlipidemia    Hypertension    Hyperthyroidism    LOOP Recorder 10/14/2013   Major depressive disorder, recurrent episode, moderate (HCC)    Memory loss    Neuromuscular disorder ([place])    Osteoarthritis    Stroke ([place])    Tobacco use disorder    Past Surgical History:  Procedure Laterality Date   CESAREAN SECTION     x3   FINGER SURGERY     LOOP RECORDER IMPLANT  08-12-2013   MDT LINQ implanted by Dr Caryl Comes for cryptogenic stroke   LOOP RECORDER IMPLANT N/A 08/12/2013   Procedure: LOOP RECORDER IMPLANT;  Surgeon: Deboraha Sprang, MD;  Location: [facility] CATH LAB;  Service: Cardiovascular;  Laterality: N/A;   TEE WITHOUT CARDIOVERSION N/A 08/12/2013   Procedure: TRANSESOPHAGEAL ECHOCARDIOGRAM (TEE);  Surgeon: Josue Hector, MD;  Location: [facility] ENDOSCOPY;  Service: Cardiovascular;  Laterality: N/A;   Patient Active Problem List   Diagnosis Date Noted   Acute CVA (cerebrovascular accident) ([place]) 07/15/2020   Dysuria 05/29/2018   Residual cognitive deficit as late effect of stroke  04/26/2016   Hemiparesis affecting left side as late effect of cerebrovascular accident (CVA) ([place]) 04/26/2016   Carpal tunnel syndrome on left 11/29/2015   DDD (degenerative disc disease), cervical 11/29/2015   Depression 11/29/2015   Chronic pain of left knee 09/04/2015   Headache 09/04/2015   Paroxysmal atrial fibrillation ([place]) 07/22/2015   Complex regional pain syndrome type 2 of upper extremity 11/05/2013   Carpal tunnel syndrome on right 10/22/2013   Chronic embolism and thrombosis of deep vein of left upper extremity ([place]) 10/22/2013   Cardiac device in situ 10/14/2013   Essential hypertension 09/30/2013   Complex regional pain syndrome type 1 of both upper extremities 09/30/2013   Diabetes mellitus ([place]) 09/28/2013   Hyperthyroidism 09/28/2013   Median nerve neuritis 09/28/2013   Ataxia, late effect of cerebrovascular disease 09/28/2013   Spastic hemiplegia affecting dominant side ([place]) 09/28/2013   CVA (cerebral vascular accident) ([place]) 08/15/2013   Cerebral embolism with cerebral infarction ([place]) 08/09/2013   Dysarthria 08/09/2013   Abnormality of gait 05/01/2012   Incontinence of urine 05/01/2012   Tobacco abuse 01/28/2012   Tobacco dependence syndrome 01/28/2012   Plantar fasciitis 09/01/2010   Allergic rhinitis 07/11/2010  Persistent cough 06/16/2010   Asthma night-time symptoms 06/14/2010   GERD (gastroesophageal reflux disease) 05/26/2010   Stroke ([place]) 05/26/2010   Hyperlipidemia 05/26/2010   Osteoarthritis 05/26/2010   Mononeuritis 05/26/2010    ONSET DATE: referral date 03/12/22   REFERRING DIAG: I63.9 (ICD-10-CM) - CVA (cerebral vascular accident  THERAPY DIAG:  Pain in left hand  Pain in right hand  Muscle weakness (generalized)  Other lack of coordination  Hemiplegia and hemiparesis following cerebral infarction affecting left non-dominant side (HCC)  Unsteadiness on feet  Rationale for Evaluation and Treatment: Rehabilitation  SUBJECTIVE:    SUBJECTIVE STATEMENT: Pt reports "hands are driving me crazy".  Pt reports B hand pain, limiting engagement in ADLs and IADLs.  Pt reports having a personal care attendant Monday-Friday 9-5 that assist with breakfast and lunch, assisting with bathing/dressing, cleaning the kitchen, laundry, providing transportation to grocery store and medical appointments.  Pt expressing desire to return to the pool for aquatic exercise/mobility.  The patient also has been following with Ortho and hand surgeon and was told that because of her stroke she is not a candidate for any surgical intervention at present. Pt accompanied by: self and caregiver   PERTINENT HISTORY: PMHx left temporal parietal infarction back in 2008 at which time she presented with expressive aphasia, additional CVAs in 2015 and 2022, has history of hypertension, dyslipidemia, tobacco abuse as well. Pt with very painfull LUE.   PRECAUTIONS: Fall and Other: CRPS in LUE    WEIGHT BEARING RESTRICTIONS: No   PAIN:  Are you having pain? Yes: NPRS scale: 6/10 Pain location: B hands, head Pain description: Hands - aching, sharp; Head: throbbing Aggravating factors: none stated Relieving factors: pain meds   FALLS: Has patient fallen in last 6 months? No   LIVING ENVIRONMENT: Lives with: lives with their son Lives in: House/apartment Stairs: Yes: External: 17 steps; on right going up Has following equipment at home: Quad cane large base, Wheelchair (manual), and shower chair   PLOF: Requires assistive device for independence and Needs assistance with ADLs   PATIENT GOALS: to get my hands and shoulders back to normal   OBJECTIVE:    HAND DOMINANCE: Right   ADLs: Overall ADLs: has PCA Monday-Friday form 9-5 who aids in bathing/dressing, meal prep, and simple housekeeping, as well as transportation to grocery store and appointments.   Transfers/ambulation related to ADLs: Utilizes quad cane at all times. Eating: Mod I, increased  time Grooming: PCA assists, completing  50% UB Dressing: Max-total assist LB Dressing: Max-total assist Toileting: Mod I, increased time Bathing: Mod assist Tub Shower transfers: HHA with transfers out of shower Equipment: Shower seat with back   IADLs: Shopping: PCA takes to store, pt will ride in Web designer Light housekeeping: PCA completes Meal Prep: PCA completes, pt can microwave some items Community mobility: PCA or adult son provide all transportation Medication management: PCA opens pill bottles, pt then will fill out pill box and is able to take Financial management: Pt is able to complete Handwriting: 100% legible and Increased time (PPT #1 37.69 sec due to aphasia   MOBILITY STATUS: Needs Assist: large based quad cane   POSTURE COMMENTS:  No Significant postural limitations   ACTIVITY TOLERANCE: Activity tolerance: WFL for tasks assessed one eval   FUNCTIONAL OUTCOME MEASURES: Quick Dash: 63.6   UPPER EXTREMITY ROM:     Active ROM Right eval Left eval  Shoulder flexion 112 108  Shoulder abduction      Shoulder adduction  Shoulder extension      Shoulder internal rotation      Shoulder external rotation      Elbow flexion WFL (onset of pain in shoulder/upper arm) WFL  Elbow extension [facility] WFL  Wrist flexion 60 52  Wrist extension 25 25  Wrist ulnar deviation Difficulty BUE due to pain and difficulty following commands    Wrist radial deviation      Wrist pronation      Wrist supination -10 -5  (Blank rows = not tested)    HAND FUNCTION: Not formally assessed due to hand pain, loose gross grasp bilaterally R > L; flattening in thenar eminence L more prominent than R   COORDINATION: Finger Nose Finger test: slower bilaterally 9 Hole Peg test: Right: 30.16 sec; Left: 41.29 sec Box and Blocks:  Right 46 blocks, Left 42 blocks   SENSATION: Hypersensitive to touch    COGNITION: Overall cognitive status: History of cognitive impairments - at  baseline; memory and expressive aphasia s/p CVA in 2008   VISION: Subjective report: in need of updated prescription Baseline vision: Wears glasses all the time Visual history: cataracts   VISION ASSESSMENT: To be further assessed in functional context   Patient has difficulty with following activities due to following visual impairments: reading  OBSERVATIONS: guarding and grimacing with R shoulder ROM and to touch when assessing sensation   TODAY'S TREATMENT:                                                                              NA eval only  PATIENT EDUCATION: Education details: Educated on role and purpose of OT as well as potential interventions and goals for therapy based on initial evaluation findings. Person educated: Patient Education method: Explanation Education comprehension: verbalized understanding and needs further education  HOME EXERCISE PROGRAM: TBD   GOALS: Goals reviewed with patient? No  SHORT TERM GOALS: Target date: 04/27/22  Pt will be independent in gentle AROM HEP for B hands to increase ease with ADLs/IADLs. Baseline: Goal status: INITIAL  2.  Pt will be able to verbalize desensitization strategies and modified positioning/techniques to decrease pain impacting engagement in ADLs/IADLs. Baseline:  Goal status: INITIAL  3.  Pt will verbalize understanding of task modifications and/or potential AE needs to increase ease, safety, and independence w/ ADLs Baseline:  Goal status: INITIAL   LONG TERM GOALS: Target date: 05/25/22  Pt will be independent with shoulder ROM HEP to allow for increased ROM and ease to engage in ADLs/IADLs. Baseline:  Goal status: INITIAL  2.  Pt will be able to complete UB dressing (not including bra) with supervision/setup with use of AE PRN. Baseline:  Goal status: INITIAL  3.  Pt will be able to complete LB dressing, to include footwear, with Supervision/setup with AE PRN. Baseline:  Goal status:  INITIAL  4.  Pt will demonstrate ability to sustain hold on utensils and open food containers without increase of pain in B hands > 2/10 on pain scale to increase participation in simple meal prep. Baseline:  Goal status: INITIAL  5.  Pt will demonstrate improved functional use of BUE as evidenced by improved score on QuickDASH to < 50% disability. Baseline:  Goal status: INITIAL  6.  Pt will be independent with splint wear and care PRN. Baseline:  Goal status: INITIAL  ASSESSMENT:  CLINICAL IMPRESSION: Patient is a 72 y.o. female who was seen today for occupational therapy evaluation for BUE pain and weakness s/p CVA and CRPS. Pt has a personal care aide 5 days/week who assists with bathing/dressing, meal prep, housekeeping task, and transportation.  Pt currently lives with in a 2nd floor apt with adult son who stays there occasionally. Pt will benefit from skilled occupational therapy services to address strength and coordination, ROM, pain management, altered sensation, balance, GM/FM control, cognition, safety awareness, introduction of compensatory strategies/AE prn, and implementation of an HEP to improve participation and safety during ADLs and IADLs.    PERFORMANCE DEFICITS: in functional skills including ADLs, IADLs, coordination, dexterity, ROM, strength, pain, flexibility, Fine motor control, Gross motor control, body mechanics, endurance, decreased knowledge of precautions, decreased knowledge of use of DME, and UE functional use and psychosocial skills including environmental adaptation and habits.   IMPAIRMENTS: are limiting patient from ADLs, IADLs, and social participation.   CO-MORBIDITIES: may have co-morbidities  that affects occupational performance. Patient will benefit from skilled OT to address above impairments and improve overall function.  MODIFICATION OR ASSISTANCE TO COMPLETE EVALUATION: No modification of tasks or assist necessary to complete an evaluation.  OT  OCCUPATIONAL PROFILE AND HISTORY: Detailed assessment: Review of records and additional review of physical, cognitive, psychosocial history related to current functional performance.  CLINICAL DECISION MAKING: LOW - limited treatment options, no task modification necessary  REHAB POTENTIAL: Fair    EVALUATION COMPLEXITY: Low    PLAN:  OT FREQUENCY: 1-2x/week  OT DURATION: 8 weeks  PLANNED INTERVENTIONS: self care/ADL training, therapeutic exercise, therapeutic activity, neuromuscular re-education, manual therapy, passive range of motion, splinting, ultrasound, iontophoresis, paraffin, compression bandaging, moist heat, cryotherapy, contrast bath, patient/family education, psychosocial skills training, energy conservation, coping strategies training, and DME and/or AE instructions  RECOMMENDED OTHER SERVICES: NA  CONSULTED AND AGREED WITH PLAN OF CARE: Patient  PLAN FOR NEXT SESSION: Initiate gentle ROM exercises, desensitization strategies from CRPS, educate on AE for ADLs/IADLs   Tineshia Becraft, [place], OTR/L 03/30/2022, 12:23 PM

## 2022-03-30 NOTE — Therapy (Signed)
OUTPATIENT PHYSICAL THERAPY NEURO EVALUATION   Patient Name: Kara Mendoza MRN: [id number] DOB:16-Jun-1950, 72 y.o., female Today's Date: 03/30/2022   PCP: Loni Beckwith, MD REFERRING PROVIDER: Loni Beckwith, MD  END OF SESSION:  PT End of Session - 03/30/22 0934     Visit Number 1    Number of Visits 8    Date for PT Re-Evaluation 05/25/22    Authorization Type [employer] Medicare    Progress Note Due on Visit 10    PT Start Time 0930    PT Stop Time 1015    PT Time Calculation (min) 45 min             Past Medical History:  Diagnosis Date   Abnormality of gait    Acute bronchitis    Allergy    Anxiety    Anxiety state, unspecified    Asthma    Chronic arm pain 2008   left   Chronic headache    Chronic leg pain 2008   left   Chronic pain syndrome    Constipation    Diabetes mellitus ([place])    Dysarthria    Esophageal reflux    Fatigue    History of fall    Hyperlipidemia    Hypertension    Hyperthyroidism    LOOP Recorder 10/14/2013   Major depressive disorder, recurrent episode, moderate (HCC)    Memory loss    Neuromuscular disorder ([place])    Osteoarthritis    Stroke ([place])    Tobacco use disorder    Past Surgical History:  Procedure Laterality Date   CESAREAN SECTION     x3   FINGER SURGERY     LOOP RECORDER IMPLANT  08-12-2013   MDT LINQ implanted by Dr Caryl Comes for cryptogenic stroke   LOOP RECORDER IMPLANT N/A 08/12/2013   Procedure: LOOP RECORDER IMPLANT;  Surgeon: Deboraha Sprang, MD;  Location: [facility] CATH LAB;  Service: Cardiovascular;  Laterality: N/A;   TEE WITHOUT CARDIOVERSION N/A 08/12/2013   Procedure: TRANSESOPHAGEAL ECHOCARDIOGRAM (TEE);  Surgeon: Josue Hector, MD;  Location: [facility] ENDOSCOPY;  Service: Cardiovascular;  Laterality: N/A;   Patient Active Problem List   Diagnosis Date Noted   Acute CVA (cerebrovascular accident) ([place]) 07/15/2020   Dysuria 05/29/2018   Residual cognitive deficit as late effect of stroke 04/26/2016    Hemiparesis affecting left side as late effect of cerebrovascular accident (CVA) ([place]) 04/26/2016   Carpal tunnel syndrome on left 11/29/2015   DDD (degenerative disc disease), cervical 11/29/2015   Depression 11/29/2015   Chronic pain of left knee 09/04/2015   Headache 09/04/2015   Paroxysmal atrial fibrillation ([place]) 07/22/2015   Complex regional pain syndrome type 2 of upper extremity 11/05/2013   Carpal tunnel syndrome on right 10/22/2013   Chronic embolism and thrombosis of deep vein of left upper extremity ([place]) 10/22/2013   Cardiac device in situ 10/14/2013   Essential hypertension 09/30/2013   Complex regional pain syndrome type 1 of both upper extremities 09/30/2013   Diabetes mellitus ([place]) 09/28/2013   Hyperthyroidism 09/28/2013   Median nerve neuritis 09/28/2013   Ataxia, late effect of cerebrovascular disease 09/28/2013   Spastic hemiplegia affecting dominant side ([place]) 09/28/2013   CVA (cerebral vascular accident) ([place]) 08/15/2013   Cerebral embolism with cerebral infarction ([place]) 08/09/2013   Dysarthria 08/09/2013   Abnormality of gait 05/01/2012   Incontinence of urine 05/01/2012   Tobacco abuse 01/28/2012   Tobacco dependence syndrome 01/28/2012   Plantar fasciitis 09/01/2010   Allergic  rhinitis 07/11/2010   Persistent cough 06/16/2010   Asthma night-time symptoms 06/14/2010   GERD (gastroesophageal reflux disease) 05/26/2010   Stroke ([place]) 05/26/2010   Hyperlipidemia 05/26/2010   Osteoarthritis 05/26/2010   Mononeuritis 05/26/2010    ONSET DATE: 07/15/20  REFERRING DIAG: I63.9 (ICD-10-CM) - CVA (cerebral vascular accident)  THERAPY DIAG:  Difficulty in walking, not elsewhere classified  Muscle weakness (generalized)  Unsteadiness on feet  Rationale for Evaluation and Treatment: Rehabilitation  SUBJECTIVE:                                                                                                                                                                                              SUBJECTIVE STATEMENT: "Both of my arms/hands are messed up. Playing tambourine and hit the hands and hurts real bad." Pt has hx of CVA x 3 and has been experiencing further weakness and imbalance but denies falling. Pt reports primarily ambulatory using NBQC. Pt notes her left side is the weaker from CVA (records and exam reveal right side weakness). Pt is right handed  Pt accompanied by:  cousin/agency caregiver  PERTINENT HISTORY: hx of CVA  PAIN:  Are you having pain? No, reports hand pain/dysfunction   PRECAUTIONS: Fall  WEIGHT BEARING RESTRICTIONS: No  FALLS: Has patient fallen in last 6 months? No  LIVING ENVIRONMENT: Lives with: lives with their family and lives with their son Lives in: [place], apartment Stairs: Yes: External: 17 steps; on left going up Has following equipment at home: Quad cane small base  PLOF: Independent with household mobility with device, Needs assistance with ADLs, and Needs assistance with homemaking  PATIENT GOALS: HAVANNAH Mendoza is a 72 year old female. The patient is new to our practice and wants to discuss about her stroke symptoms and management. The patient had a documented stroke and was treated at [facility] in June 2022. MRI brain notable for acute ischemic cortical infarct involving the left frontotemporal region as well as underlying chronic left MCA distribution infarct with moderate chronic microvascular ischemic changes. Since then the patient has been having difficulty with speech and memory. The patient also has weakness right greater than left whole body and has gone to rehab in the past. She is accompanied by a home nurse who goes to see her 5 days a week.  OBJECTIVE:   DIAGNOSTIC FINDINGS:   COGNITION: Overall cognitive status: History of cognitive impairments - at baseline   SENSATION: WFL  COORDINATION: Gross motor intact BLE  EDEMA:    MUSCLE TONE:    DTRs:     POSTURE: No Significant postural limitations  LOWER  EXTREMITY ROM:     WNL  LOWER EXTREMITY MMT:    LLE 4/5 gross strength RLE 3+/5 gross strengt  BED MOBILITY:  indep  TRANSFERS: Assistive device utilized: Quad cane small base  Sit to stand: Modified independence Stand to sit: Complete Independence and Modified independence Chair to chair: Complete Independence Floor:  NT  RAMP:  NT  CURB:  Level of Assistance: Modified independence and SBA Assistive device utilized: Environmental manager Comments:   STAIRS: Level of Assistance: Modified independence and SBA Stair Negotiation Technique: Step to Pattern Alternating Pattern  with Bilateral Rails Number of Stairs: 15  Height of Stairs: 4-6  Comments:   GAIT: Gait pattern: step through pattern Distance walked:  Assistive device utilized: Quad cane small base Level of assistance: Modified independence Comments:   FUNCTIONAL TESTS:  5 times sit to stand: 19.06 Timed up and go (TUG): 13.31, no AD--3 instances of lateral unsteadiness Berg Balance Scale: 45/56  M-CTSIB  Condition 1: Firm Surface, EO 30 Sec, Normal Sway  Condition 2: Firm Surface, EC 30 Sec, Normal Sway  Condition 3: Foam Surface, EO 30 Sec, Normal and Mild Sway  Condition 4: Foam Surface, EC 30 Sec, Mild Sway     PATIENT EDUCATION: Education details: assessment findings Person educated: Patient Education method: Explanation Education comprehension: needs further education  HOME EXERCISE PROGRAM: TBD  GOALS: Goals reviewed with patient? Yes  SHORT TERM GOALS: Target date: 04/27/2022    Patient will be independent in HEP to improve functional outcomes Baseline: Goal status: INITIAL    LONG TERM GOALS: Target date: 05/25/2022    Patient will be independent in advanced HEP to improve functional outcomes Baseline:  Goal status: INITIAL  2.  Demo improved dynamic balance per score 50/56 Berg Balance Test to reduce risk  for falls during mobility and ADL Baseline: 45/56 Goal status: INITIAL  3.  Improve RLE strength to 4/5 for improve motor control/stability for gait/balance Baseline: 3+/5 Goal status: INITIAL  4.  Demo improved safety and home access via modified indep stair ambulation while carrying bag/cane Baseline: assist needed Goal status: INITIAL  5.  Improved safety and reduce risk for falls per 12 sec TUG test Baseline: 13+ sec w/ unsteadiness throughout Goal status: INITIAL   ASSESSMENT:  CLINICAL IMPRESSION: Patient is a 72 y.o. lady who was seen today for physical therapy evaluation and treatment for hx of CVA and experiencing sequelae in her mobility and balance. Pt would benefit from PT intervention to improve strength, balance, activity tolerance and ability to perform ambulation over various surfaces and stairs with reduced exertion/burden to improve community-level mobility   OBJECTIVE IMPAIRMENTS: Abnormal gait, decreased activity tolerance, decreased balance, decreased endurance, difficulty walking, decreased strength, and impaired perceived functional ability.   ACTIVITY LIMITATIONS: carrying, lifting, stairs, and locomotion level  PARTICIPATION LIMITATIONS: meal prep, shopping, community activity, and church  PERSONAL FACTORS: Age, Time since onset of injury/illness/exacerbation, and 1-2 comorbidities: see PMH  are also affecting patient's functional outcome.   REHAB POTENTIAL: Good  CLINICAL DECISION MAKING: Stable/uncomplicated  EVALUATION COMPLEXITY: Low  PLAN:  PT FREQUENCY: 1x/week  PT DURATION: 8 weeks  PLANNED INTERVENTIONS: Therapeutic exercises, Therapeutic activity, Neuromuscular re-education, Balance training, Gait training, Patient/Family education, Self Care, Joint mobilization, Stair training, Vestibular training, Canalith repositioning, Orthotic/Fit training, DME instructions, Aquatic Therapy, Dry Needling, Electrical stimulation, Wheelchair mobility  training, Spinal mobilization, Cryotherapy, Moist heat, Taping, Ultrasound, Ionotophoresis '4mg'$ /ml Dexamethasone, and Manual therapy  PLAN FOR NEXT SESSION: initiate HEP for balance/strength  11:24 AM, 03/30/22 M. Sherlyn Lees, PT, DPT Physical Therapist- [place] Office Number: [phone]

## 2022-04-09 ENCOUNTER — Ambulatory Visit: Payer: 59 | Attending: Internal Medicine | Admitting: Occupational Therapy

## 2022-04-09 ENCOUNTER — Ambulatory Visit: Payer: 59 | Admitting: Physical Therapy

## 2022-04-09 DIAGNOSIS — R278 Other lack of coordination: Secondary | ICD-10-CM | POA: Insufficient documentation

## 2022-04-09 DIAGNOSIS — M6281 Muscle weakness (generalized): Secondary | ICD-10-CM | POA: Insufficient documentation

## 2022-04-09 DIAGNOSIS — I69354 Hemiplegia and hemiparesis following cerebral infarction affecting left non-dominant side: Secondary | ICD-10-CM | POA: Insufficient documentation

## 2022-04-09 DIAGNOSIS — M79642 Pain in left hand: Secondary | ICD-10-CM | POA: Insufficient documentation

## 2022-04-09 DIAGNOSIS — M79641 Pain in right hand: Secondary | ICD-10-CM | POA: Insufficient documentation

## 2022-04-09 DIAGNOSIS — R2681 Unsteadiness on feet: Secondary | ICD-10-CM | POA: Insufficient documentation

## 2022-04-09 DIAGNOSIS — R262 Difficulty in walking, not elsewhere classified: Secondary | ICD-10-CM | POA: Insufficient documentation

## 2022-04-12 ENCOUNTER — Ambulatory Visit: Payer: 59 | Admitting: Occupational Therapy

## 2022-04-12 DIAGNOSIS — I69354 Hemiplegia and hemiparesis following cerebral infarction affecting left non-dominant side: Secondary | ICD-10-CM | POA: Diagnosis present

## 2022-04-12 DIAGNOSIS — M79641 Pain in right hand: Secondary | ICD-10-CM

## 2022-04-12 DIAGNOSIS — R262 Difficulty in walking, not elsewhere classified: Secondary | ICD-10-CM | POA: Diagnosis present

## 2022-04-12 DIAGNOSIS — R2681 Unsteadiness on feet: Secondary | ICD-10-CM | POA: Diagnosis present

## 2022-04-12 DIAGNOSIS — R278 Other lack of coordination: Secondary | ICD-10-CM

## 2022-04-12 DIAGNOSIS — M6281 Muscle weakness (generalized): Secondary | ICD-10-CM

## 2022-04-12 DIAGNOSIS — M79642 Pain in left hand: Secondary | ICD-10-CM

## 2022-04-12 NOTE — Therapy (Signed)
OUTPATIENT OCCUPATIONAL THERAPY NEURO  Treatment Note  Patient Name: Kara Mendoza MRN: [id number] DOB:Aug 15, 1950, 72 y.o., female Today's Date: 04/12/2022  PCP: Loni Beckwith, MD REFERRING PROVIDER: Loni Beckwith, MD  END OF SESSION:  OT End of Session - 04/12/22 1149     Visit Number 2    Number of Visits 17    Date for OT Re-Evaluation 05/25/22    Authorization Type [employer] Medicare    OT Start Time 1148    OT Stop Time 1230    OT Time Calculation (min) 42 min    Activity Tolerance Patient tolerated treatment well              Past Medical History:  Diagnosis Date   Abnormality of gait    Acute bronchitis    Allergy    Anxiety    Anxiety state, unspecified    Asthma    Chronic arm pain 2008   left   Chronic headache    Chronic leg pain 2008   left   Chronic pain syndrome    Constipation    Diabetes mellitus ([place])    Dysarthria    Esophageal reflux    Fatigue    History of fall    Hyperlipidemia    Hypertension    Hyperthyroidism    LOOP Recorder 10/14/2013   Major depressive disorder, recurrent episode, moderate ([place])    Memory loss    Neuromuscular disorder ([place])    Osteoarthritis    Stroke ([place])    Tobacco use disorder    Past Surgical History:  Procedure Laterality Date   CESAREAN SECTION     x3   FINGER SURGERY     LOOP RECORDER IMPLANT  08-12-2013   MDT LINQ implanted by Dr Caryl Comes for cryptogenic stroke   LOOP RECORDER IMPLANT N/A 08/12/2013   Procedure: LOOP RECORDER IMPLANT;  Surgeon: Deboraha Sprang, MD;  Location: [facility] CATH LAB;  Service: Cardiovascular;  Laterality: N/A;   TEE WITHOUT CARDIOVERSION N/A 08/12/2013   Procedure: TRANSESOPHAGEAL ECHOCARDIOGRAM (TEE);  Surgeon: Josue Hector, MD;  Location: [facility] ENDOSCOPY;  Service: Cardiovascular;  Laterality: N/A;   Patient Active Problem List   Diagnosis Date Noted   Acute CVA (cerebrovascular accident) ([place]) 07/15/2020   Dysuria 05/29/2018   Residual cognitive deficit as late effect of  stroke 04/26/2016   Hemiparesis affecting left side as late effect of cerebrovascular accident (CVA) ([place]) 04/26/2016   Carpal tunnel syndrome on left 11/29/2015   DDD (degenerative disc disease), cervical 11/29/2015   Depression 11/29/2015   Chronic pain of left knee 09/04/2015   Headache 09/04/2015   Paroxysmal atrial fibrillation ([place]) 07/22/2015   Complex regional pain syndrome type 2 of upper extremity 11/05/2013   Carpal tunnel syndrome on right 10/22/2013   Chronic embolism and thrombosis of deep vein of left upper extremity ([place]) 10/22/2013   Cardiac device in situ 10/14/2013   Essential hypertension 09/30/2013   Complex regional pain syndrome type 1 of both upper extremities 09/30/2013   Diabetes mellitus ([place]) 09/28/2013   Hyperthyroidism 09/28/2013   Median nerve neuritis 09/28/2013   Ataxia, late effect of cerebrovascular disease 09/28/2013   Spastic hemiplegia affecting dominant side ([place]) 09/28/2013   CVA (cerebral vascular accident) ([place]) 08/15/2013   Cerebral embolism with cerebral infarction ([place]) 08/09/2013   Dysarthria 08/09/2013   Abnormality of gait 05/01/2012   Incontinence of urine 05/01/2012   Tobacco abuse 01/28/2012   Tobacco dependence syndrome 01/28/2012   Plantar fasciitis 09/01/2010   Allergic  rhinitis 07/11/2010   Persistent cough 06/16/2010   Asthma night-time symptoms 06/14/2010   GERD (gastroesophageal reflux disease) 05/26/2010   Stroke ([place]) 05/26/2010   Hyperlipidemia 05/26/2010   Osteoarthritis 05/26/2010   Mononeuritis 05/26/2010    ONSET DATE: referral date 03/12/22   REFERRING DIAG: I63.9 (ICD-10-CM) - CVA (cerebral vascular accident  THERAPY DIAG:  Pain in left hand  Muscle weakness (generalized)  Pain in right hand  Other lack of coordination  Hemiplegia and hemiparesis following cerebral infarction affecting left non-dominant side (HCC)  Rationale for Evaluation and Treatment: Rehabilitation  SUBJECTIVE:   SUBJECTIVE  STATEMENT: Pt reports B hands and shoulders are hurting today. Pt accompanied by: self and caregiver   PERTINENT HISTORY: PMHx left temporal parietal infarction back in 2008 at which time she presented with expressive aphasia, additional CVAs in 2015 and 2022, has history of hypertension, dyslipidemia, tobacco abuse as well. Pt with very painfull LUE.   PRECAUTIONS: Fall and Other: CRPS in LUE    WEIGHT BEARING RESTRICTIONS: No   PAIN:  Are you having pain? Yes: NPRS scale: 8/10 Pain location: B hands and shoulders Pain description: Hands - aching, sharp Aggravating factors: none stated Relieving factors: pain meds   FALLS: Has patient fallen in last 6 months? No   LIVING ENVIRONMENT: Lives with: lives with their son Lives in: House/apartment Stairs: Yes: External: 17 steps; on right going up Has following equipment at home: Quad cane large base, Wheelchair (manual), and shower chair   PLOF: Requires assistive device for independence and Needs assistance with ADLs   PATIENT GOALS: to get my hands and shoulders back to normal   OBJECTIVE:    HAND DOMINANCE: Right   ADLs: Overall ADLs: has PCA Monday-Friday form 9-5 who aids in bathing/dressing, meal prep, and simple housekeeping, as well as transportation to grocery store and appointments.   Transfers/ambulation related to ADLs: Utilizes quad cane at all times. Eating: Mod I, increased time Grooming: PCA assists, completing  50% UB Dressing: Max-total assist LB Dressing: Max-total assist Toileting: Mod I, increased time Bathing: Mod assist Tub Shower transfers: HHA with transfers out of shower Equipment: Shower seat with back   IADLs: Shopping: PCA takes to store, pt will ride in Web designer Light housekeeping: PCA completes Meal Prep: PCA completes, pt can microwave some items Community mobility: PCA or adult son provide all transportation Medication management: PCA opens pill bottles, pt then will fill out pill  box and is able to take Financial management: Pt is able to complete Handwriting: 100% legible and Increased time (PPT #1 37.69 sec due to aphasia   MOBILITY STATUS: Needs Assist: large based quad cane   POSTURE COMMENTS:  No Significant postural limitations   ACTIVITY TOLERANCE: Activity tolerance: WFL for tasks assessed one eval   FUNCTIONAL OUTCOME MEASURES: Quick Dash: 63.6   UPPER EXTREMITY ROM:     Active ROM Right eval Left eval  Shoulder flexion 112 108  Shoulder abduction      Shoulder adduction      Shoulder extension      Shoulder internal rotation      Shoulder external rotation      Elbow flexion WFL (onset of pain in shoulder/upper arm) WFL  Elbow extension [facility] WFL  Wrist flexion 60 52  Wrist extension 25 25  Wrist ulnar deviation Difficulty BUE due to pain and difficulty following commands    Wrist radial deviation      Wrist pronation      Wrist supination -  10 -5  (Blank rows = not tested)    HAND FUNCTION: Not formally assessed due to hand pain, loose gross grasp bilaterally R > L; flattening in thenar eminence L more prominent than R   COORDINATION: Finger Nose Finger test: slower bilaterally 9 Hole Peg test: Right: 30.16 sec; Left: 41.29 sec Box and Blocks:  Right 46 blocks, Left 42 blocks   SENSATION: Hypersensitive to touch    COGNITION: Overall cognitive status: History of cognitive impairments - at baseline; memory and expressive aphasia s/p CVA in 2008   VISION: Subjective report: in need of updated prescription Baseline vision: Wears glasses all the time Visual history: cataracts   VISION ASSESSMENT: To be further assessed in functional context   Patient has difficulty with following activities due to following visual impairments: reading  OBSERVATIONS: guarding and grimacing with R shoulder ROM and to touch when assessing sensation   TODAY'S TREATMENT:                                                                               04/12/22 Supine UE ROM: engaged in BUE abduction with snow angels x10. OT providing initial hand over hand cues and assist to achieve correct movement and technique.  Pt then able to complete with mobility up to 100*, maintaining hold and end range for stretch.  Pt reports "feels good".  Transitioned to increased ROM with pt tolerating shoulder abduction/adduction to bring hands together at midline/chest height with no increase in pain.  Increased challenge to incorporate grasp on dowel.  Pt able to complete chest press and shoulder flexion to 90* with dowel in BUE with initial reports of pain with shoulder flexion but able to complete x15 without increased pain.  Educated on completing in supine to allow for increased ROM tolerance with less pain.   Towel slides: educated briefly on "Scrub and carry" protocol for CRPS and engaged in BUE towel scrubs in standing to facilitate increased WB through [place].  Pt demonstrating good tolerance to movement, may benefit from completion in quadruped and use of brushes for increased resistance and feedback.   PATIENT EDUCATION: Education details: HEP for shoulder ROM  Person educated: Patient Education method: Explanation, Demonstration, Verbal cues, and Handouts Education comprehension: verbalized understanding and needs further education  HOME EXERCISE PROGRAM: Access Code: [email or web address] URL: [email or web address] Date: 04/12/2022 Prepared by: [facility]  Exercises - [facility]  - 2 x daily - 2 sets - 10 reps - 5-10 hold - Seated Shoulder Horizontal Abduction and Adduction  - 2 x daily - 2 sets - 10 reps - Supine Shoulder Press AAROM in Abduction with Dowel  - 2 x daily - 2 sets - 10 reps - Supine Shoulder Flexion with Dowel to 90  - 2 x daily - 2 sets - 10 reps - Seated Shoulder Flexion Towel Slide at Table Top  - 2 x daily - 2 sets - 10 reps   GOALS: Goals reviewed with patient? Yes  SHORT TERM GOALS:  Target date: 04/27/22  Pt will be independent in gentle AROM HEP for B hands to increase ease with ADLs/IADLs. Baseline: Goal status: IN PROGRESS  2.  Pt will be able to verbalize desensitization strategies and modified positioning/techniques to decrease pain impacting engagement in ADLs/IADLs. Baseline:  Goal status: IN PROGRESS  3.  Pt will verbalize understanding of task modifications and/or potential AE needs to increase ease, safety, and independence w/ ADLs Baseline:  Goal status: IN PROGRESS   LONG TERM GOALS: Target date: 05/25/22  Pt will be independent with shoulder ROM HEP to allow for increased ROM and ease to engage in ADLs/IADLs. Baseline:  Goal status: IN PROGRESS  2.  Pt will be able to complete UB dressing (not including bra) with supervision/setup with use of AE PRN. Baseline:  Goal status: IN PROGRESS  3.  Pt will be able to complete LB dressing, to include footwear, with Supervision/setup with AE PRN. Baseline:  Goal status: IN PROGRESS  4.  Pt will demonstrate ability to sustain hold on utensils and open food containers without increase of pain in B hands > 2/10 on pain scale to increase participation in simple meal prep. Baseline:  Goal status: IN PROGRESS  5.  Pt will demonstrate improved functional use of BUE as evidenced by improved score on QuickDASH to < 50% disability. Baseline:  Goal status: IN PROGRESS  6.  Pt will be independent with splint wear and care PRN. Baseline:  Goal status: IN PROGRESS  ASSESSMENT:  CLINICAL IMPRESSION: Pt demonstrating improved tolerance to shoulder ROM in supine.  Pt initially reporting fearfulness with increased movement, however tolerating all movements this session and reporting feeling good afterwards.  Pt tolerating moderate grasp on dowel during supine shoulder flexion and chest press with no increase in pain.  PERFORMANCE DEFICITS: in functional skills including ADLs, IADLs, coordination, dexterity, ROM,  strength, pain, flexibility, Fine motor control, Gross motor control, body mechanics, endurance, decreased knowledge of precautions, decreased knowledge of use of DME, and UE functional use and psychosocial skills including environmental adaptation and habits.   IMPAIRMENTS: are limiting patient from ADLs, IADLs, and social participation.   CO-MORBIDITIES: may have co-morbidities  that affects occupational performance. Patient will benefit from skilled OT to address above impairments and improve overall function.  MODIFICATION OR ASSISTANCE TO COMPLETE EVALUATION: No modification of tasks or assist necessary to complete an evaluation.  OT OCCUPATIONAL PROFILE AND HISTORY: Detailed assessment: Review of records and additional review of physical, cognitive, psychosocial history related to current functional performance.  CLINICAL DECISION MAKING: LOW - limited treatment options, no task modification necessary  REHAB POTENTIAL: Fair    EVALUATION COMPLEXITY: Low    PLAN:  OT FREQUENCY: 1-2x/week  OT DURATION: 8 weeks  PLANNED INTERVENTIONS: self care/ADL training, therapeutic exercise, therapeutic activity, neuromuscular re-education, manual therapy, passive range of motion, splinting, ultrasound, iontophoresis, paraffin, compression bandaging, moist heat, cryotherapy, contrast bath, patient/family education, psychosocial skills training, energy conservation, coping strategies training, and DME and/or AE instructions  RECOMMENDED OTHER SERVICES: NA  CONSULTED AND AGREED WITH PLAN OF CARE: Patient  PLAN FOR NEXT SESSION: Review gentle ROM exercises, increase WB/resistance with "scrub and carry" protocol, desensitization strategies from CRPS, educate on AE for ADLs/IADLs   [place] Mielke, [place], OTR/L 04/12/2022, 11:49 AM

## 2022-04-16 NOTE — Therapy (Addendum)
OUTPATIENT PHYSICAL THERAPY NEURO TREATMENT   Patient Name: Kara Mendoza MRN: [id number] DOB:[birth year], 72 y.o., female Today's Date: 04/17/2022   PCP: Woodfin Ganja, MD REFERRING PROVIDER: Woodfin Ganja, MD  Progress Note Reporting Period 03/30/22 to 04/17/22  See note below for Objective Data and Assessment of Progress/Goals.    END OF SESSION:  PT End of Session - 04/17/22 1100     Visit Number 2    Number of Visits 8    Date for PT Re-Evaluation 05/25/22    Authorization Type [employer]    Progress Note Due on Visit 10    PT Start Time 1017    PT Stop Time 1059    PT Time Calculation (min) 42 min    Equipment Utilized During Treatment Gait belt    Activity Tolerance Patient tolerated treatment well;No increased pain    Behavior During Therapy [facility] for tasks assessed/performed              Past Medical History:  Diagnosis Date   Abnormality of gait    Acute bronchitis    Allergy    Anxiety    Anxiety state, unspecified    Asthma    Chronic arm pain 2008   left   Chronic headache    Chronic leg pain 2008   left   Chronic pain syndrome    Constipation    Diabetes mellitus (HCC)    Dysarthria    Esophageal reflux    Fatigue    History of fall    Hyperlipidemia    Hypertension    Hyperthyroidism    LOOP Recorder 10/14/2013   Major depressive disorder, recurrent episode, moderate (HCC)    Memory loss    Neuromuscular disorder (HCC)    Osteoarthritis    Stroke (HCC)    Tobacco use disorder    Past Surgical History:  Procedure Laterality Date   CESAREAN SECTION     x3   FINGER SURGERY     LOOP RECORDER IMPLANT  08-12-2013   MDT LINQ implanted by Dr Graciela Husbands for cryptogenic stroke   LOOP RECORDER IMPLANT N/A 08/12/2013   Procedure: LOOP RECORDER IMPLANT;  Surgeon: Duke Salvia, MD;  Location: [facility] CATH LAB;  Service: Cardiovascular;  Laterality: N/A;   TEE WITHOUT CARDIOVERSION N/A 08/12/2013   Procedure: TRANSESOPHAGEAL  ECHOCARDIOGRAM (TEE);  Surgeon: Wendall Stade, MD;  Location: [facility] ENDOSCOPY;  Service: Cardiovascular;  Laterality: N/A;   Patient Active Problem List   Diagnosis Date Noted   Acute CVA (cerebrovascular accident) (HCC) 07/15/2020   Dysuria 05/29/2018   Residual cognitive deficit as late effect of stroke 04/26/2016   Hemiparesis affecting left side as late effect of cerebrovascular accident (CVA) (HCC) 04/26/2016   Carpal tunnel syndrome on left 11/29/2015   DDD (degenerative disc disease), cervical 11/29/2015   Depression 11/29/2015   Chronic pain of left knee 09/04/2015   Headache 09/04/2015   Paroxysmal atrial fibrillation (HCC) 07/22/2015   Complex regional pain syndrome type 2 of upper extremity 11/05/2013   Carpal tunnel syndrome on right 10/22/2013   Chronic embolism and thrombosis of deep vein of left upper extremity (HCC) 10/22/2013   Cardiac device in situ 10/14/2013   Essential hypertension 09/30/2013   Complex regional pain syndrome type 1 of both upper extremities 09/30/2013   Diabetes mellitus (HCC) 09/28/2013   Hyperthyroidism 09/28/2013   Median nerve neuritis 09/28/2013   Ataxia, late effect of cerebrovascular disease 09/28/2013   Spastic hemiplegia affecting dominant side (HCC) 09/28/2013  CVA (cerebral vascular accident) (HCC) 08/15/2013   Cerebral embolism with cerebral infarction (HCC) 08/09/2013   Dysarthria 08/09/2013   Abnormality of gait 05/01/2012   Incontinence of urine 05/01/2012   Tobacco abuse 01/28/2012   Tobacco dependence syndrome 01/28/2012   Plantar fasciitis 09/01/2010   Allergic rhinitis 07/11/2010   Persistent cough 06/16/2010   Asthma night-time symptoms 06/14/2010   GERD (gastroesophageal reflux disease) 05/26/2010   Stroke (HCC) 05/26/2010   Hyperlipidemia 05/26/2010   Osteoarthritis 05/26/2010   Mononeuritis 05/26/2010    ONSET DATE: 07/15/20  REFERRING DIAG: I63.9 (ICD-10-CM) - CVA (cerebral vascular accident)  THERAPY DIAG:   Difficulty in walking, not elsewhere classified  Muscle weakness (generalized)  Unsteadiness on feet  Rationale for Evaluation and Treatment: Rehabilitation  SUBJECTIVE:                                                                                                                                                                                             SUBJECTIVE STATEMENT: Both hands and legs are bothering you. Denies recent falls.Wanting to do water therapy.   Pt accompanied by: self   PERTINENT HISTORY: hx of CVA  PAIN:  Are you having pain? Yes: NPRS scale: 8/10 Pain location: B hands and LEs Pain description: aching Aggravating factors: nothing Relieving factors: meds  PRECAUTIONS: Fall  WEIGHT BEARING RESTRICTIONS: No  FALLS: Has patient fallen in last 6 months? No  LIVING ENVIRONMENT: Lives with: lives with their family and lives with their son Lives in: [place], apartment Stairs: Yes: External: 17 steps; on left going up Has following equipment at home: Quad cane small base  PLOF: Independent with household mobility with device, Needs assistance with ADLs, and Needs assistance with homemaking  PATIENT GOALS: Kara Mendoza is a 72 year old female. The patient is new to our practice and wants to discuss about her stroke symptoms and management. The patient had a documented stroke and was treated at [facility] in June 2022. MRI brain notable for acute ischemic cortical infarct involving the left frontotemporal region as well as underlying chronic left MCA distribution infarct with moderate chronic microvascular ischemic changes. Since then the patient has been having difficulty with speech and memory. The patient also has weakness right greater than left whole body and has gone to rehab in the past. She is accompanied by a home nurse who goes to see her 5 days a week.  OBJECTIVE:     TODAY'S TREATMENT: 04/17/22 Activity Comments  Nustep L1 x  6 min Ues/LEs Maintaining 75 SPM  STS 10x without UEs Reviewed 3 steps for safe transfer set up  as patient demonstrated retropulsion; continued verbal cueing required but improved execution evident   gait training using SPC x269ft Adjusted cane height with improved stability; cueing for heel-toe pattern to reduce shuffling; posterior trunk lean present   standing hip ABD 10x each Manual cues to avoid trunk lean  standing hip EX 10x each Manual cues to avoid trunk lean  gastroc stretch 30" each in standing  Heavy adjustment to foot positioning; better tolerance in sitting with strap   HOME EXERCISE PROGRAM Last updated: 3/112/24 Access Code: [email or web address] URL: [email or web address] Date: 04/17/2022 Prepared by: [facility] - Outpatient  Rehab - [facility]  Exercises - Sit to Stand with Arms Crossed  - 1 x daily - 5 x weekly - 2 sets - 10 reps - Standing Hip Abduction with Counter Support  - 1 x daily - 5 x weekly - 2 sets - 10 reps - Standing Hip Extension with Counter Support  - 1 x daily - 5 x weekly - 2 sets - 10 reps   PATIENT EDUCATION: Education details: edu on aquatic therapy; HEP- with edu on safe performance Person educated: Patient Education method: Explanation, Demonstration, Tactile cues, Verbal cues, and Handouts Education comprehension: verbalized understanding and returned demonstration   Below measures were taken at time of initial evaluation unless otherwise specified:   DIAGNOSTIC FINDINGS:   COGNITION: Overall cognitive status: History of cognitive impairments - at baseline   SENSATION: WFL  COORDINATION: Gross motor intact BLE  EDEMA:    MUSCLE TONE:    DTRs:    POSTURE: No Significant postural limitations  LOWER EXTREMITY ROM:     WNL  LOWER EXTREMITY MMT:    LLE 4/5 gross strength RLE 3+/5 gross strengt  BED MOBILITY:  indep  TRANSFERS: Assistive device utilized: Counselling psychologist  Sit to stand: Modified  independence Stand to sit: Complete Independence and Modified independence Chair to chair: Complete Independence Floor:  NT  RAMP:  NT  CURB:  Level of Assistance: Modified independence and SBA Assistive device utilized: Museum/gallery exhibitions officer Comments:   STAIRS: Level of Assistance: Modified independence and SBA Stair Negotiation Technique: Step to Pattern Alternating Pattern  with Bilateral Rails Number of Stairs: 15  Height of Stairs: 4-6  Comments:   GAIT: Gait pattern: step through pattern Distance walked:  Assistive device utilized: Quad cane small base Level of assistance: Modified independence Comments:   FUNCTIONAL TESTS:  5 times sit to stand: 19.06 Timed up and go (TUG): 13.31, no AD--3 instances of lateral unsteadiness Berg Balance Scale: 45/56  M-CTSIB  Condition 1: Firm Surface, EO 30 Sec, Normal Sway  Condition 2: Firm Surface, EC 30 Sec, Normal Sway  Condition 3: Foam Surface, EO 30 Sec, Normal and Mild Sway  Condition 4: Foam Surface, EC 30 Sec, Mild Sway     PATIENT EDUCATION: Education details: assessment findings Person educated: Patient Education method: Explanation Education comprehension: needs further education  HOME EXERCISE PROGRAM: TBD  GOALS: Goals reviewed with patient? Yes  SHORT TERM GOALS: Target date: 04/27/2022    Patient will be independent in HEP to improve functional outcomes Baseline: Goal status: IN PROGRESS    LONG TERM GOALS: Target date: 05/25/2022    Patient will be independent in advanced HEP to improve functional outcomes Baseline:  Goal status: IN PROGRESS  2.  Demo improved dynamic balance per score 50/56 Berg Balance Test to reduce risk for falls during mobility and ADL Baseline: 45/56 Goal status: IN PROGRESS  3.  Improve RLE strength to 4/5 for improve motor control/stability for gait/balance Baseline: 3+/5 Goal status: IN PROGRESS  4.  Demo improved safety and home access via modified  indep stair ambulation while carrying bag/cane Baseline: assist needed Goal status: IN PROGRESS  5.  Improved safety and reduce risk for falls per 12 sec TUG test Baseline: 13+ sec w/ unsteadiness throughout Goal status: IN PROGRESS   ASSESSMENT:  CLINICAL IMPRESSION: Patient arrived to session with report of chronic B hand and LE pain. Reports that she would like to pursue aquatic therapy- upon chart review it does not appear that this has been previously discussed. Worked on STS transfers with review of proper set up. Patient requires consistent verbal cueing to perform properly, however with improved ease and stability after practice. Adjusted height of cane and focused on heel-toe pattern for improved stability. Patient still with posterior trunk lean with gait, leading to instability and possible posterior LOB.  Patient reported understanding of HEP with edu on safe performance. No complaints at end of session.   OBJECTIVE IMPAIRMENTS: Abnormal gait, decreased activity tolerance, decreased balance, decreased endurance, difficulty walking, decreased strength, and impaired perceived functional ability.   ACTIVITY LIMITATIONS: carrying, lifting, stairs, and locomotion level  PARTICIPATION LIMITATIONS: meal prep, shopping, community activity, and church  PERSONAL FACTORS: Age, Time since onset of injury/illness/exacerbation, and 1-2 comorbidities: see PMH  are also affecting patient's functional outcome.   REHAB POTENTIAL: Good  CLINICAL DECISION MAKING: Stable/uncomplicated  EVALUATION COMPLEXITY: Low  PLAN:  PT FREQUENCY: 1x/week  PT DURATION: 8 weeks  PLANNED INTERVENTIONS: Therapeutic exercises, Therapeutic activity, Neuromuscular re-education, Balance training, Gait training, Patient/Family education, Self Care, Joint mobilization, Stair training, Vestibular training, Canalith repositioning, Orthotic/Fit training, DME instructions, Aquatic Therapy, Dry Needling, Electrical  stimulation, Wheelchair mobility training, Spinal mobilization, Cryotherapy, Moist heat, Taping, Ultrasound, Ionotophoresis 4mg /ml Dexamethasone, and Manual therapy  PLAN FOR NEXT SESSION: review HEP for balance/strength   Anette Guarneri, PT, DPT 04/17/22 11:03 AM  [facility] at [facility] [address], [address] [place], [place] [address] Phone # [phone] Fax # [phone]    PHYSICAL THERAPY DISCHARGE SUMMARY  Visits from Start of Care: 2  Current functional level related to goals / functional outcomes: Unable to assess; pt did not return   Remaining deficits: Unable to assess   Education / Equipment: HEP  Plan: Patient agrees to discharge.  Patient goals were not met. Patient is being discharged due to not returning.     Anette Guarneri, PT, DPT 07/30/22 12:56 PM  [facility] at [facility] [address] [place], [address] [place], [place] [address] Phone # [phone] Fax # [phone]

## 2022-04-17 ENCOUNTER — Ambulatory Visit: Payer: 59 | Admitting: Physical Therapy

## 2022-04-17 ENCOUNTER — Encounter: Payer: Self-pay | Admitting: Physical Therapy

## 2022-04-17 ENCOUNTER — Ambulatory Visit: Payer: 59 | Admitting: Occupational Therapy

## 2022-04-17 DIAGNOSIS — M79641 Pain in right hand: Secondary | ICD-10-CM

## 2022-04-17 DIAGNOSIS — R2681 Unsteadiness on feet: Secondary | ICD-10-CM

## 2022-04-17 DIAGNOSIS — R262 Difficulty in walking, not elsewhere classified: Secondary | ICD-10-CM

## 2022-04-17 DIAGNOSIS — I69354 Hemiplegia and hemiparesis following cerebral infarction affecting left non-dominant side: Secondary | ICD-10-CM

## 2022-04-17 DIAGNOSIS — M79642 Pain in left hand: Secondary | ICD-10-CM

## 2022-04-17 DIAGNOSIS — M6281 Muscle weakness (generalized): Secondary | ICD-10-CM

## 2022-04-17 DIAGNOSIS — R278 Other lack of coordination: Secondary | ICD-10-CM

## 2022-04-17 NOTE — Therapy (Signed)
OUTPATIENT OCCUPATIONAL THERAPY NEURO  Treatment Note  Patient Name: Kara Mendoza MRN: [id number] DOB:[birth year], 72 y.o., female Today's Date: 04/17/2022  PCP: Loni Beckwith, MD REFERRING PROVIDER: Loni Beckwith, MD  END OF SESSION:  OT End of Session - 04/17/22 1109     Visit Number 3    Number of Visits 17    Date for OT Re-Evaluation 05/25/22    Authorization Type [employer] Medicare    OT Start Time 1104    OT Stop Time 1146    OT Time Calculation (min) 42 min    Activity Tolerance Patient tolerated treatment well               Past Medical History:  Diagnosis Date   Abnormality of gait    Acute bronchitis    Allergy    Anxiety    Anxiety state, unspecified    Asthma    Chronic arm pain 2008   left   Chronic headache    Chronic leg pain 2008   left   Chronic pain syndrome    Constipation    Diabetes mellitus ([place])    Dysarthria    Esophageal reflux    Fatigue    History of fall    Hyperlipidemia    Hypertension    Hyperthyroidism    LOOP Recorder 10/14/2013   Major depressive disorder, recurrent episode, moderate ([place])    Memory loss    Neuromuscular disorder ([place])    Osteoarthritis    Stroke ([place])    Tobacco use disorder    Past Surgical History:  Procedure Laterality Date   CESAREAN SECTION     x3   FINGER SURGERY     LOOP RECORDER IMPLANT  08-12-2013   MDT LINQ implanted by Dr Caryl Comes for cryptogenic stroke   LOOP RECORDER IMPLANT N/A 08/12/2013   Procedure: LOOP RECORDER IMPLANT;  Surgeon: Deboraha Sprang, MD;  Location: [facility] CATH LAB;  Service: Cardiovascular;  Laterality: N/A;   TEE WITHOUT CARDIOVERSION N/A 08/12/2013   Procedure: TRANSESOPHAGEAL ECHOCARDIOGRAM (TEE);  Surgeon: Josue Hector, MD;  Location: [facility] ENDOSCOPY;  Service: Cardiovascular;  Laterality: N/A;   Patient Active Problem List   Diagnosis Date Noted   Acute CVA (cerebrovascular accident) ([place]) 07/15/2020   Dysuria 05/29/2018   Residual cognitive deficit as late effect of  stroke 04/26/2016   Hemiparesis affecting left side as late effect of cerebrovascular accident (CVA) ([place]) 04/26/2016   Carpal tunnel syndrome on left 11/29/2015   DDD (degenerative disc disease), cervical 11/29/2015   Depression 11/29/2015   Chronic pain of left knee 09/04/2015   Headache 09/04/2015   Paroxysmal atrial fibrillation ([place]) 07/22/2015   Complex regional pain syndrome type 2 of upper extremity 11/05/2013   Carpal tunnel syndrome on right 10/22/2013   Chronic embolism and thrombosis of deep vein of left upper extremity ([place]) 10/22/2013   Cardiac device in situ 10/14/2013   Essential hypertension 09/30/2013   Complex regional pain syndrome type 1 of both upper extremities 09/30/2013   Diabetes mellitus ([place]) 09/28/2013   Hyperthyroidism 09/28/2013   Median nerve neuritis 09/28/2013   Ataxia, late effect of cerebrovascular disease 09/28/2013   Spastic hemiplegia affecting dominant side ([place]) 09/28/2013   CVA (cerebral vascular accident) ([place]) 08/15/2013   Cerebral embolism with cerebral infarction ([place]) 08/09/2013   Dysarthria 08/09/2013   Abnormality of gait 05/01/2012   Incontinence of urine 05/01/2012   Tobacco abuse 01/28/2012   Tobacco dependence syndrome 01/28/2012   Plantar fasciitis 09/01/2010  Allergic rhinitis 07/11/2010   Persistent cough 06/16/2010   Asthma night-time symptoms 06/14/2010   GERD (gastroesophageal reflux disease) 05/26/2010   Stroke ([place]) 05/26/2010   Hyperlipidemia 05/26/2010   Osteoarthritis 05/26/2010   Mononeuritis 05/26/2010    ONSET DATE: referral date 03/12/22   REFERRING DIAG: I63.9 (ICD-10-CM) - CVA (cerebral vascular accident  THERAPY DIAG:  Hemiplegia and hemiparesis following cerebral infarction affecting left non-dominant side (HCC)  Other lack of coordination  Unsteadiness on feet  Pain in right hand  Pain in left hand  Rationale for Evaluation and Treatment: Rehabilitation  SUBJECTIVE:   SUBJECTIVE  STATEMENT: Pt reports doing the exercises as best she could, but limited by pain.NO Pt accompanied by: self and caregiver   PERTINENT HISTORY: PMHx left temporal parietal infarction back in 2008 at which time she presented with expressive aphasia, additional CVAs in 2015 and 2022, has history of hypertension, dyslipidemia, tobacco abuse as well. Pt with very painfull LUE.   PRECAUTIONS: Fall and Other: CRPS in LUE    WEIGHT BEARING RESTRICTIONS: No   PAIN:  Are you having pain? Yes: NPRS scale: 8/10 Pain location: B hands and shoulders Pain description: Hands - aching, sharp Aggravating factors: none stated Relieving factors: pain meds   FALLS: Has patient fallen in last 6 months? No   LIVING ENVIRONMENT: Lives with: lives with their son Lives in: House/apartment Stairs: Yes: External: 17 steps; on right going up Has following equipment at home: Quad cane large base, Wheelchair (manual), and shower chair   PLOF: Requires assistive device for independence and Needs assistance with ADLs   PATIENT GOALS: to get my hands and shoulders back to normal   OBJECTIVE:    HAND DOMINANCE: Right   ADLs: Overall ADLs: has PCA Monday-Friday form 9-5 who aids in bathing/dressing, meal prep, and simple housekeeping, as well as transportation to grocery store and appointments.   Transfers/ambulation related to ADLs: Utilizes quad cane at all times. Eating: Mod I, increased time Grooming: PCA assists, completing  50% UB Dressing: Max-total assist LB Dressing: Max-total assist Toileting: Mod I, increased time Bathing: Mod assist Tub Shower transfers: HHA with transfers out of shower Equipment: Shower seat with back   IADLs: Shopping: PCA takes to store, pt will ride in Web designer Light housekeeping: PCA completes Meal Prep: PCA completes, pt can microwave some items Community mobility: PCA or adult son provide all transportation Medication management: PCA opens pill bottles, pt then  will fill out pill box and is able to take Financial management: Pt is able to complete Handwriting: 100% legible and Increased time (PPT #1 37.69 sec due to aphasia   MOBILITY STATUS: Needs Assist: large based quad cane   POSTURE COMMENTS:  No Significant postural limitations   ACTIVITY TOLERANCE: Activity tolerance: WFL for tasks assessed one eval   FUNCTIONAL OUTCOME MEASURES: Quick Dash: 63.6   UPPER EXTREMITY ROM:     Active ROM Right eval Left eval  Shoulder flexion 112 108  Shoulder abduction      Shoulder adduction      Shoulder extension      Shoulder internal rotation      Shoulder external rotation      Elbow flexion WFL (onset of pain in shoulder/upper arm) WFL  Elbow extension [facility] WFL  Wrist flexion 60 52  Wrist extension 25 25  Wrist ulnar deviation Difficulty BUE due to pain and difficulty following commands    Wrist radial deviation      Wrist pronation  Wrist supination -10 -5  (Blank rows = not tested)    HAND FUNCTION: Not formally assessed due to hand pain, loose gross grasp bilaterally R > L; flattening in thenar eminence L more prominent than R   COORDINATION: Finger Nose Finger test: slower bilaterally 9 Hole Peg test: Right: 30.16 sec; Left: 41.29 sec Box and Blocks:  Right 46 blocks, Left 42 blocks   SENSATION: Hypersensitive to touch    COGNITION: Overall cognitive status: History of cognitive impairments - at baseline; memory and expressive aphasia s/p CVA in 2008   VISION: Subjective report: in need of updated prescription Baseline vision: Wears glasses all the time Visual history: cataracts   VISION ASSESSMENT: To be further assessed in functional context   Patient has difficulty with following activities due to following visual impairments: reading  OBSERVATIONS: guarding and grimacing with R shoulder ROM and to touch when assessing sensation   TODAY'S TREATMENT:                                                                               04/17/22 Engaged in WB through [place] while standing at elevated mat table.  OT facilitated increased WB through B hands in modified "scrub and carry" technique for CRPS.  Pt scrubbing with LUE while WB through RUE and then crossing midline to place ring on target, then switched UE.  Pt reports increased pain in LUE up to 9/10 with WB.  Pt tearful and requesting to rest for a bit prior to resuming session. UB dressing: engaged in discussion of typical dressing routine.  Pt reports that caregiver assists her with dressing due to limited ROM and pain in L shoulder > R shoulder.   Towel slides: reiterated BUE towel scrubs from previous session, completing in sitting at elevated mat table to allow for increased ease and ROM of shoulders, while completing WB through BUE.  Pt demonstrating good tolerance to movement, reporting decreased pain in shoulders but still guarding L hand.    04/12/22 Supine UE ROM: engaged in BUE abduction with snow angels x10. OT providing initial hand over hand cues and assist to achieve correct movement and technique.  Pt then able to complete with mobility up to 100*, maintaining hold and end range for stretch.  Pt reports "feels good".  Transitioned to increased ROM with pt tolerating shoulder abduction/adduction to bring hands together at midline/chest height with no increase in pain.  Increased challenge to incorporate grasp on dowel.  Pt able to complete chest press and shoulder flexion to 90* with dowel in BUE with initial reports of pain with shoulder flexion but able to complete x15 without increased pain.  Educated on completing in supine to allow for increased ROM tolerance with less pain.   Towel slides: educated briefly on "Scrub and carry" protocol for CRPS and engaged in BUE towel scrubs in standing to facilitate increased WB through [place].  Pt demonstrating good tolerance to movement, may benefit from completion in quadruped and use of brushes for increased  resistance and feedback.   PATIENT EDUCATION: Education details: HEP for shoulder ROM  Person educated: Patient Education method: Explanation, Demonstration, Verbal cues, and Handouts Education comprehension: verbalized understanding and needs further  education  HOME EXERCISE PROGRAM: Access Code: [email or web address] URL: [email or web address] Date: 04/12/2022 Prepared by: [facility]  Exercises - [facility]  - 2 x daily - 2 sets - 10 reps - 5-10 hold - Seated Shoulder Horizontal Abduction and Adduction  - 2 x daily - 2 sets - 10 reps - Supine Shoulder Press AAROM in Abduction with Dowel  - 2 x daily - 2 sets - 10 reps - Supine Shoulder Flexion with Dowel to 90  - 2 x daily - 2 sets - 10 reps - Seated Shoulder Flexion Towel Slide at Table Top  - 2 x daily - 2 sets - 10 reps   GOALS: Goals reviewed with patient? Yes  SHORT TERM GOALS: Target date: 04/27/22  Pt will be independent in gentle AROM HEP for B hands to increase ease with ADLs/IADLs. Baseline: Goal status: IN PROGRESS  2.  Pt will be able to verbalize desensitization strategies and modified positioning/techniques to decrease pain impacting engagement in ADLs/IADLs. Baseline:  Goal status: IN PROGRESS  3.  Pt will verbalize understanding of task modifications and/or potential AE needs to increase ease, safety, and independence w/ ADLs Baseline:  Goal status: IN PROGRESS   LONG TERM GOALS: Target date: 05/25/22  Pt will be independent with shoulder ROM HEP to allow for increased ROM and ease to engage in ADLs/IADLs. Baseline:  Goal status: IN PROGRESS  2.  Pt will be able to complete UB dressing (not including bra) with supervision/setup with use of AE PRN. Baseline:  Goal status: IN PROGRESS  3.  Pt will be able to complete LB dressing, to include footwear, with Supervision/setup with AE PRN. Baseline:  Goal status: IN PROGRESS  4.  Pt will demonstrate ability to  sustain hold on utensils and open food containers without increase of pain in B hands > 2/10 on pain scale to increase participation in simple meal prep. Baseline:  Goal status: IN PROGRESS  5.  Pt will demonstrate improved functional use of BUE as evidenced by improved score on QuickDASH to < 50% disability. Baseline:  Goal status: IN PROGRESS  6.  Pt will be independent with splint wear and care PRN. Baseline:  Goal status: IN PROGRESS  ASSESSMENT:  CLINICAL IMPRESSION: Pt demonstrating increased pain with increased WB during scrub portion of "scrub and carry" protocol.  Pt tearful due to pain in L arm, however after rest pt agreeable to attempt seated activity.  Pt engaging in towel slides in sitting at elevated table, tolerating all movements this session and reporting feeling good afterwards.  Pt still limited in increased activity and ROM due to pain in BUE L > R.  PERFORMANCE DEFICITS: in functional skills including ADLs, IADLs, coordination, dexterity, ROM, strength, pain, flexibility, Fine motor control, Gross motor control, body mechanics, endurance, decreased knowledge of precautions, decreased knowledge of use of DME, and UE functional use and psychosocial skills including environmental adaptation and habits.   IMPAIRMENTS: are limiting patient from ADLs, IADLs, and social participation.   CO-MORBIDITIES: may have co-morbidities  that affects occupational performance. Patient will benefit from skilled OT to address above impairments and improve overall function.  MODIFICATION OR ASSISTANCE TO COMPLETE EVALUATION: No modification of tasks or assist necessary to complete an evaluation.  OT OCCUPATIONAL PROFILE AND HISTORY: Detailed assessment: Review of records and additional review of physical, cognitive, psychosocial history related to current functional performance.  CLINICAL DECISION MAKING: LOW - limited treatment options, no task modification necessary  REHAB POTENTIAL:  Fair    EVALUATION COMPLEXITY: Low    PLAN:  OT FREQUENCY: 1-2x/week  OT DURATION: 8 weeks  PLANNED INTERVENTIONS: self care/ADL training, therapeutic exercise, therapeutic activity, neuromuscular re-education, manual therapy, passive range of motion, splinting, ultrasound, iontophoresis, paraffin, compression bandaging, moist heat, cryotherapy, contrast bath, patient/family education, psychosocial skills training, energy conservation, coping strategies training, and DME and/or AE instructions  RECOMMENDED OTHER SERVICES: NA  CONSULTED AND AGREED WITH PLAN OF CARE: Patient  PLAN FOR NEXT SESSION: Review gentle ROM exercises, ADD shoulder exercises to increase ROM and ease for UB dressing, increase WB/resistance with "scrub and carry" protocol, desensitization strategies from CRPS, educate on AE for ADLs/IADLs   Goldman Birchall, [place], OTR/L 04/17/2022, 11:10 AM

## 2022-04-19 ENCOUNTER — Ambulatory Visit: Payer: 59 | Admitting: Occupational Therapy

## 2022-04-19 DIAGNOSIS — M79641 Pain in right hand: Secondary | ICD-10-CM

## 2022-04-19 DIAGNOSIS — M79642 Pain in left hand: Secondary | ICD-10-CM

## 2022-04-19 DIAGNOSIS — I69354 Hemiplegia and hemiparesis following cerebral infarction affecting left non-dominant side: Secondary | ICD-10-CM | POA: Diagnosis not present

## 2022-04-19 NOTE — Therapy (Addendum)
OUTPATIENT OCCUPATIONAL THERAPY NEURO  Treatment Note  Patient Name: Kara Mendoza MRN: [id number] DOB:[birth year], 72 y.o., female Today's Date: 04/19/2022  PCP: Loni Beckwith, MD REFERRING PROVIDER: Loni Beckwith, MD  END OF SESSION:  OT End of Session - 04/19/22 1619     Visit Number 4    Number of Visits 17    Date for OT Re-Evaluation 05/25/22    Authorization Type [employer] Medicare    OT Start Time 1106    OT Stop Time 1146    OT Time Calculation (min) 40 min    Activity Tolerance Patient tolerated treatment well                Past Medical History:  Diagnosis Date   Abnormality of gait    Acute bronchitis    Allergy    Anxiety    Anxiety state, unspecified    Asthma    Chronic arm pain 2008   left   Chronic headache    Chronic leg pain 2008   left   Chronic pain syndrome    Constipation    Diabetes mellitus ([place])    Dysarthria    Esophageal reflux    Fatigue    History of fall    Hyperlipidemia    Hypertension    Hyperthyroidism    LOOP Recorder 10/14/2013   Major depressive disorder, recurrent episode, moderate ([place])    Memory loss    Neuromuscular disorder ([place])    Osteoarthritis    Stroke ([place])    Tobacco use disorder    Past Surgical History:  Procedure Laterality Date   CESAREAN SECTION     x3   FINGER SURGERY     LOOP RECORDER IMPLANT  08-12-2013   MDT LINQ implanted by Dr Caryl Comes for cryptogenic stroke   LOOP RECORDER IMPLANT N/A 08/12/2013   Procedure: LOOP RECORDER IMPLANT;  Surgeon: Deboraha Sprang, MD;  Location: [facility] CATH LAB;  Service: Cardiovascular;  Laterality: N/A;   TEE WITHOUT CARDIOVERSION N/A 08/12/2013   Procedure: TRANSESOPHAGEAL ECHOCARDIOGRAM (TEE);  Surgeon: Josue Hector, MD;  Location: [facility] ENDOSCOPY;  Service: Cardiovascular;  Laterality: N/A;   Patient Active Problem List   Diagnosis Date Noted   Acute CVA (cerebrovascular accident) ([place]) 07/15/2020   Dysuria 05/29/2018   Residual cognitive deficit as late effect of  stroke 04/26/2016   Hemiparesis affecting left side as late effect of cerebrovascular accident (CVA) ([place]) 04/26/2016   Carpal tunnel syndrome on left 11/29/2015   DDD (degenerative disc disease), cervical 11/29/2015   Depression 11/29/2015   Chronic pain of left knee 09/04/2015   Headache 09/04/2015   Paroxysmal atrial fibrillation ([place]) 07/22/2015   Complex regional pain syndrome type 2 of upper extremity 11/05/2013   Carpal tunnel syndrome on right 10/22/2013   Chronic embolism and thrombosis of deep vein of left upper extremity ([place]) 10/22/2013   Cardiac device in situ 10/14/2013   Essential hypertension 09/30/2013   Complex regional pain syndrome type 1 of both upper extremities 09/30/2013   Diabetes mellitus ([place]) 09/28/2013   Hyperthyroidism 09/28/2013   Median nerve neuritis 09/28/2013   Ataxia, late effect of cerebrovascular disease 09/28/2013   Spastic hemiplegia affecting dominant side ([place]) 09/28/2013   CVA (cerebral vascular accident) ([place]) 08/15/2013   Cerebral embolism with cerebral infarction ([place]) 08/09/2013   Dysarthria 08/09/2013   Abnormality of gait 05/01/2012   Incontinence of urine 05/01/2012   Tobacco abuse 01/28/2012   Tobacco dependence syndrome 01/28/2012   Plantar fasciitis 09/01/2010  Allergic rhinitis 07/11/2010   Persistent cough 06/16/2010   Asthma night-time symptoms 06/14/2010   GERD (gastroesophageal reflux disease) 05/26/2010   Stroke ([place]) 05/26/2010   Hyperlipidemia 05/26/2010   Osteoarthritis 05/26/2010   Mononeuritis 05/26/2010    ONSET DATE: referral date 03/12/22   REFERRING DIAG: I63.9 (ICD-10-CM) - CVA (cerebral vascular accident  THERAPY DIAG:  Hemiplegia and hemiparesis following cerebral infarction affecting left non-dominant side (HCC)  Pain in right hand  Pain in left hand  Rationale for Evaluation and Treatment: Rehabilitation  SUBJECTIVE:   SUBJECTIVE STATEMENT: Pt reports feeling good after therapy session last  time. Pt accompanied by: self and caregiver   PERTINENT HISTORY: PMHx left temporal parietal infarction back in 2008 at which time she presented with expressive aphasia, additional CVAs in 2015 and 2022, has history of hypertension, dyslipidemia, tobacco abuse as well. Pt with very painfull LUE.   PRECAUTIONS: Fall and Other: CRPS in LUE    WEIGHT BEARING RESTRICTIONS: No   PAIN:  Are you having pain? Yes: NPRS scale: 7/10 Pain location: B hands and shoulders Pain description: Hands - aching, sharp Aggravating factors: none stated Relieving factors: pain meds   FALLS: Has patient fallen in last 6 months? No   LIVING ENVIRONMENT: Lives with: lives with their son Lives in: House/apartment Stairs: Yes: External: 17 steps; on right going up Has following equipment at home: Quad cane large base, Wheelchair (manual), and shower chair   PLOF: Requires assistive device for independence and Needs assistance with ADLs   PATIENT GOALS: to get my hands and shoulders back to normal   OBJECTIVE:    HAND DOMINANCE: Right   ADLs: Overall ADLs: has PCA Monday-Friday form 9-5 who aids in bathing/dressing, meal prep, and simple housekeeping, as well as transportation to grocery store and appointments.   Transfers/ambulation related to ADLs: Utilizes quad cane at all times. Eating: Mod I, increased time Grooming: PCA assists, completing  50% UB Dressing: Max-total assist LB Dressing: Max-total assist Toileting: Mod I, increased time Bathing: Mod assist Tub Shower transfers: HHA with transfers out of shower Equipment: Shower seat with back   IADLs: Shopping: PCA takes to store, pt will ride in Web designer Light housekeeping: PCA completes Meal Prep: PCA completes, pt can microwave some items Community mobility: PCA or adult son provide all transportation Medication management: PCA opens pill bottles, pt then will fill out pill box and is able to take Financial management: Pt is able to  complete Handwriting: 100% legible and Increased time (PPT #1 37.69 sec due to aphasia   MOBILITY STATUS: Needs Assist: large based quad cane   POSTURE COMMENTS:  No Significant postural limitations   ACTIVITY TOLERANCE: Activity tolerance: WFL for tasks assessed one eval   FUNCTIONAL OUTCOME MEASURES: Quick Dash: 63.6   UPPER EXTREMITY ROM:     Active ROM Right eval Left eval  Shoulder flexion 112 108  Shoulder abduction      Shoulder adduction      Shoulder extension      Shoulder internal rotation      Shoulder external rotation      Elbow flexion WFL (onset of pain in shoulder/upper arm) WFL  Elbow extension [facility] WFL  Wrist flexion 60 52  Wrist extension 25 25  Wrist ulnar deviation Difficulty BUE due to pain and difficulty following commands    Wrist radial deviation      Wrist pronation      Wrist supination -10 -5  (Blank rows = not tested)  HAND FUNCTION: Not formally assessed due to hand pain, loose gross grasp bilaterally R > L; flattening in thenar eminence L more prominent than R   COORDINATION: Finger Nose Finger test: slower bilaterally 9 Hole Peg test: Right: 30.16 sec; Left: 41.29 sec Box and Blocks:  Right 46 blocks, Left 42 blocks   SENSATION: Hypersensitive to touch    COGNITION: Overall cognitive status: History of cognitive impairments - at baseline; memory and expressive aphasia s/p CVA in 2008   VISION: Subjective report: in need of updated prescription Baseline vision: Wears glasses all the time Visual history: cataracts   VISION ASSESSMENT: To be further assessed in functional context   Patient has difficulty with following activities due to following visual impairments: reading  OBSERVATIONS: guarding and grimacing with R shoulder ROM and to touch when assessing sensation   TODAY'S TREATMENT:                                                                              04/19/22 Supine UE ROM: engaged in BUE abduction with snow  angels x15, pt demonstrating increased shoulder flexion/abduction without increase in pain. OT providing initial hand over hand cues and assist to achieve correct movement and technique. Engaged in additional ROM with pt tolerating shoulder abduction/adduction to bring hands together at midline/chest height with no increase in pain.  Pt able to complete chest press and shoulder flexion to 90* with dowel in BUE with initial grimacing with shoulder flexion but able to complete x15 without increased pain.  OT providing cues to complete movements slowly and within pain tolerance. Mirror therapy: OT positioned tall mirror at midline with RUE placed on table on R to reflect in mirror on LUE placed on table on L occluded by mirror.  Pt instructed to look at reflection in mirror and imagine it as LUE while engaging in bilateral supination/pronation and simple grasp/release.  Pt tolerating movement while visually attending to reflection in mirror.  Pt initially skeptical but with decrease in pain with use of mirror therapy.    04/17/22 Engaged in WB through [place] while standing at elevated mat table.  OT facilitated increased WB through B hands in modified "scrub and carry" technique for CRPS.  Pt scrubbing with LUE while WB through RUE and then crossing midline to place ring on target, then switched UE.  Pt reports increased pain in LUE up to 9/10 with WB.  Pt tearful and requesting to rest for a bit prior to resuming session. UB dressing: engaged in discussion of typical dressing routine.  Pt reports that caregiver assists her with dressing due to limited ROM and pain in L shoulder > R shoulder.   Towel slides: reiterated BUE towel scrubs from previous session, completing in sitting at elevated mat table to allow for increased ease and ROM of shoulders, while completing WB through BUE.  Pt demonstrating good tolerance to movement, reporting decreased pain in shoulders but still guarding L hand.    04/12/22 Supine UE  ROM: engaged in BUE abduction with snow angels x10. OT providing initial hand over hand cues and assist to achieve correct movement and technique.  Pt then able to complete with mobility up to 100*, maintaining hold  and end range for stretch.  Pt reports "feels good".  Transitioned to increased ROM with pt tolerating shoulder abduction/adduction to bring hands together at midline/chest height with no increase in pain.  Increased challenge to incorporate grasp on dowel.  Pt able to complete chest press and shoulder flexion to 90* with dowel in BUE with initial reports of pain with shoulder flexion but able to complete x15 without increased pain.  Educated on completing in supine to allow for increased ROM tolerance with less pain.   Towel slides: educated briefly on "Scrub and carry" protocol for CRPS and engaged in BUE towel scrubs in standing to facilitate increased WB through [place].  Pt demonstrating good tolerance to movement, may benefit from completion in quadruped and use of brushes for increased resistance and feedback.   PATIENT EDUCATION: Education details: HEP for shoulder ROM  Person educated: Patient Education method: Explanation, Demonstration, Verbal cues, and Handouts Education comprehension: verbalized understanding and needs further education  HOME EXERCISE PROGRAM: Access Code: [email or web address] URL: [email or web address] Date: 04/12/2022 Prepared by: [facility]  Exercises - [facility]  - 2 x daily - 2 sets - 10 reps - 5-10 hold - Seated Shoulder Horizontal Abduction and Adduction  - 2 x daily - 2 sets - 10 reps - Supine Shoulder Press AAROM in Abduction with Dowel  - 2 x daily - 2 sets - 10 reps - Supine Shoulder Flexion with Dowel to 90  - 2 x daily - 2 sets - 10 reps - Seated Shoulder Flexion Towel Slide at Table Top  - 2 x daily - 2 sets - 10 reps   GOALS: Goals reviewed with patient? Yes  SHORT TERM GOALS: Target date:  04/27/22  Pt will be independent in gentle AROM HEP for B hands to increase ease with ADLs/IADLs. Baseline: Goal status: IN PROGRESS  2.  Pt will be able to verbalize desensitization strategies and modified positioning/techniques to decrease pain impacting engagement in ADLs/IADLs. Baseline:  Goal status: IN PROGRESS  3.  Pt will verbalize understanding of task modifications and/or potential AE needs to increase ease, safety, and independence w/ ADLs Baseline:  Goal status: IN PROGRESS   LONG TERM GOALS: Target date: 05/25/22  Pt will be independent with shoulder ROM HEP to allow for increased ROM and ease to engage in ADLs/IADLs. Baseline:  Goal status: IN PROGRESS  2.  Pt will be able to complete UB dressing (not including bra) with supervision/setup with use of AE PRN. Baseline:  Goal status: IN PROGRESS  3.  Pt will be able to complete LB dressing, to include footwear, with Supervision/setup with AE PRN. Baseline:  Goal status: IN PROGRESS  4.  Pt will demonstrate ability to sustain hold on utensils and open food containers without increase of pain in B hands > 2/10 on pain scale to increase participation in simple meal prep. Baseline:  Goal status: IN PROGRESS  5.  Pt will demonstrate improved functional use of BUE as evidenced by improved score on QuickDASH to < 50% disability. Baseline:  Goal status: IN PROGRESS  6.  Pt will be independent with splint wear and care PRN. Baseline:  Goal status: IN PROGRESS  ASSESSMENT:  CLINICAL IMPRESSION: Pt demonstrating tolerance to shoulder ROM in supine with and without dowel, demonstrating increased ROM and repetitions.  Pt initially skeptical of mirror therapy, however with ability to engage in simple hand and forearm movements while visualizing reflection as LUE.   PERFORMANCE DEFICITS:  in functional skills including ADLs, IADLs, coordination, dexterity, ROM, strength, pain, flexibility, Fine motor control, Gross motor  control, body mechanics, endurance, decreased knowledge of precautions, decreased knowledge of use of DME, and UE functional use and psychosocial skills including environmental adaptation and habits.   IMPAIRMENTS: are limiting patient from ADLs, IADLs, and social participation.   CO-MORBIDITIES: may have co-morbidities  that affects occupational performance. Patient will benefit from skilled OT to address above impairments and improve overall function.  MODIFICATION OR ASSISTANCE TO COMPLETE EVALUATION: No modification of tasks or assist necessary to complete an evaluation.  OT OCCUPATIONAL PROFILE AND HISTORY: Detailed assessment: Review of records and additional review of physical, cognitive, psychosocial history related to current functional performance.  CLINICAL DECISION MAKING: LOW - limited treatment options, no task modification necessary  REHAB POTENTIAL: Fair    EVALUATION COMPLEXITY: Low    PLAN:  OT FREQUENCY: 1-2x/week  OT DURATION: 8 weeks  PLANNED INTERVENTIONS: self care/ADL training, therapeutic exercise, therapeutic activity, neuromuscular re-education, manual therapy, passive range of motion, splinting, ultrasound, iontophoresis, paraffin, compression bandaging, moist heat, cryotherapy, contrast bath, patient/family education, psychosocial skills training, energy conservation, coping strategies training, and DME and/or AE instructions  RECOMMENDED OTHER SERVICES: NA  CONSULTED AND AGREED WITH PLAN OF CARE: Patient  PLAN FOR NEXT SESSION: Review gentle ROM exercises, ADD shoulder exercises to increase ROM and ease for UB dressing, MIRROR therapy, desensitization strategies from CRPS, educate on AE for ADLs/IADLs   Lari Linson, OTR/L 04/19/2022, 4:20 PM   OCCUPATIONAL THERAPY DISCHARGE SUMMARY  Visits from Start of Care: 4  Current functional level related to goals / functional outcomes: Unable to assess as pt has not returned since above visit.  Pt with  persistent hand and shoulder pain, however did benefit from use of mirror therapy upon last session.   Remaining deficits: B hand and shoulder pain, weakness, requiring assist for self-care tasks   Education / Equipment: Shoulder HEP, education on mirror therapy for pain   Patient agrees to discharge. Patient goals were not met. Patient is being discharged due to not returning since the last visit.  Rosalio Loud, OT 06/05/22

## 2022-04-24 ENCOUNTER — Ambulatory Visit: Payer: 59 | Admitting: Occupational Therapy

## 2022-04-24 ENCOUNTER — Ambulatory Visit: Payer: 59 | Admitting: Physical Therapy

## 2022-04-26 ENCOUNTER — Ambulatory Visit: Payer: 59 | Admitting: Occupational Therapy

## 2022-05-02 ENCOUNTER — Ambulatory Visit: Payer: 59 | Admitting: Physical Therapy

## 2022-05-02 ENCOUNTER — Ambulatory Visit: Payer: 59 | Admitting: Occupational Therapy

## 2022-05-07 ENCOUNTER — Encounter: Payer: Self-pay | Admitting: Neurology

## 2022-05-07 ENCOUNTER — Ambulatory Visit: Payer: 59 | Admitting: Neurology

## 2022-05-08 ENCOUNTER — Encounter: Payer: 59 | Admitting: Occupational Therapy

## 2022-05-08 ENCOUNTER — Ambulatory Visit: Payer: 59 | Admitting: Physical Therapy

## 2022-05-10 ENCOUNTER — Encounter: Payer: 59 | Admitting: Occupational Therapy

## 2022-05-15 ENCOUNTER — Ambulatory Visit: Payer: 59 | Admitting: Physical Therapy

## 2022-05-15 ENCOUNTER — Encounter: Payer: 59 | Admitting: Occupational Therapy

## 2022-05-17 ENCOUNTER — Encounter: Payer: 59 | Admitting: Occupational Therapy

## 2022-08-11 ENCOUNTER — Other Ambulatory Visit: Payer: Self-pay | Admitting: Family

## 2022-08-11 DIAGNOSIS — I639 Cerebral infarction, unspecified: Secondary | ICD-10-CM

## 2022-09-01 ENCOUNTER — Other Ambulatory Visit: Payer: Self-pay | Admitting: Family

## 2022-09-01 DIAGNOSIS — F339 Major depressive disorder, recurrent, unspecified: Secondary | ICD-10-CM

## 2022-09-01 DIAGNOSIS — G5643 Causalgia of bilateral upper limbs: Secondary | ICD-10-CM

## 2022-09-01 DIAGNOSIS — E782 Mixed hyperlipidemia: Secondary | ICD-10-CM

## 2022-09-08 ENCOUNTER — Other Ambulatory Visit: Payer: Self-pay

## 2022-09-08 ENCOUNTER — Encounter (HOSPITAL_COMMUNITY): Payer: Self-pay

## 2022-09-08 ENCOUNTER — Emergency Department (HOSPITAL_COMMUNITY)
Admission: EM | Admit: 2022-09-08 | Discharge: 2022-09-09 | Disposition: A | Discharge status: Home / Self Care | Payer: 59 | Attending: Emergency Medicine | Admitting: Emergency Medicine

## 2022-09-08 DIAGNOSIS — W57XXXA Bitten or stung by nonvenomous insect and other nonvenomous arthropods, initial encounter: Secondary | ICD-10-CM | POA: Diagnosis not present

## 2022-09-08 DIAGNOSIS — Z79899 Other long term (current) drug therapy: Secondary | ICD-10-CM | POA: Insufficient documentation

## 2022-09-08 DIAGNOSIS — E119 Type 2 diabetes mellitus without complications: Secondary | ICD-10-CM | POA: Diagnosis not present

## 2022-09-08 DIAGNOSIS — Z7984 Long term (current) use of oral hypoglycemic drugs: Secondary | ICD-10-CM | POA: Diagnosis not present

## 2022-09-08 DIAGNOSIS — I10 Essential (primary) hypertension: Secondary | ICD-10-CM | POA: Diagnosis not present

## 2022-09-08 DIAGNOSIS — S60561A Insect bite (nonvenomous) of right hand, initial encounter: Secondary | ICD-10-CM | POA: Insufficient documentation

## 2022-09-08 DIAGNOSIS — T63481A Toxic effect of venom of other arthropod, accidental (unintentional), initial encounter: Secondary | ICD-10-CM

## 2022-09-08 MED ORDER — PREDNISONE 20 MG PO TABS: 40.0000 mg | ORAL_TABLET | Freq: Every day | ORAL | 0 refills | Status: AC

## 2022-09-08 MED ORDER — PREDNISONE 20 MG PO TABS
40.0000 mg | ORAL_TABLET | Freq: Once | ORAL | Status: AC
  Administered 2022-09-08: 40 mg via ORAL
  Filled 2022-09-08: qty 2
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
## 2022-09-09 NOTE — ED Notes (Signed)
Pt informed this RN that her granddaughter is coming to pick her up to take her to her daughter's house tonight.
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
## 2022-09-13 ENCOUNTER — Other Ambulatory Visit: Payer: Self-pay | Admitting: Family

## 2022-09-13 DIAGNOSIS — E782 Mixed hyperlipidemia: Secondary | ICD-10-CM

## 2022-09-26 ENCOUNTER — Other Ambulatory Visit: Payer: Self-pay | Admitting: Family

## 2022-09-26 DIAGNOSIS — I10 Essential (primary) hypertension: Secondary | ICD-10-CM

## 2022-09-26 DIAGNOSIS — E1140 Type 2 diabetes mellitus with diabetic neuropathy, unspecified: Secondary | ICD-10-CM

## 2022-10-01 ENCOUNTER — Other Ambulatory Visit: Payer: Self-pay | Admitting: Family

## 2022-10-01 DIAGNOSIS — I639 Cerebral infarction, unspecified: Secondary | ICD-10-CM

## 2022-10-01 DIAGNOSIS — I10 Essential (primary) hypertension: Secondary | ICD-10-CM

## 2023-01-25 ENCOUNTER — Emergency Department (HOSPITAL_COMMUNITY)
Admission: EM | Admit: 2023-01-25 | Discharge: 2023-01-25 | Discharge status: Against Medical Advice | Payer: 59 | Attending: Physician Assistant | Admitting: Physician Assistant

## 2023-01-25 ENCOUNTER — Other Ambulatory Visit: Payer: Self-pay

## 2023-01-25 ENCOUNTER — Encounter (HOSPITAL_COMMUNITY): Payer: Self-pay

## 2023-01-25 ENCOUNTER — Emergency Department (HOSPITAL_COMMUNITY): Payer: 59

## 2023-01-25 DIAGNOSIS — Z8673 Personal history of transient ischemic attack (TIA), and cerebral infarction without residual deficits: Secondary | ICD-10-CM | POA: Diagnosis not present

## 2023-01-25 DIAGNOSIS — R109 Unspecified abdominal pain: Secondary | ICD-10-CM | POA: Diagnosis not present

## 2023-01-25 DIAGNOSIS — Z5321 Procedure and treatment not carried out due to patient leaving prior to being seen by health care provider: Secondary | ICD-10-CM | POA: Insufficient documentation

## 2023-01-25 DIAGNOSIS — R197 Diarrhea, unspecified: Secondary | ICD-10-CM | POA: Insufficient documentation

## 2023-01-25 DIAGNOSIS — R112 Nausea with vomiting, unspecified: Secondary | ICD-10-CM | POA: Diagnosis present

## 2023-01-25 DIAGNOSIS — R509 Fever, unspecified: Secondary | ICD-10-CM | POA: Insufficient documentation

## 2023-01-25 LAB — CBC WITH DIFFERENTIAL/PLATELET
Abs Immature Granulocytes: 0.02 10*3/uL (ref 0.00–0.07)
Basophils Absolute: 0 10*3/uL (ref 0.0–0.1)
Basophils Relative: 0 %
Eosinophils Absolute: 0 10*3/uL (ref 0.0–0.5)
Eosinophils Relative: 0 %
HCT: 44.5 % (ref 36.0–46.0)
Hemoglobin: 15.2 g/dL — ABNORMAL HIGH (ref 12.0–15.0)
Immature Granulocytes: 0 %
Lymphocytes Relative: 7 %
Lymphs Abs: 0.5 10*3/uL — ABNORMAL LOW (ref 0.7–4.0)
MCH: 30.5 pg (ref 26.0–34.0)
MCHC: 34.2 g/dL (ref 30.0–36.0)
MCV: 89.2 fL (ref 80.0–100.0)
Monocytes Absolute: 0.3 10*3/uL (ref 0.1–1.0)
Monocytes Relative: 3 %
Neutro Abs: 6.7 10*3/uL (ref 1.7–7.7)
Neutrophils Relative %: 90 %
Platelets: 278 10*3/uL (ref 150–400)
RBC: 4.99 MIL/uL (ref 3.87–5.11)
RDW: 12.5 % (ref 11.5–15.5)
WBC: 7.6 10*3/uL (ref 4.0–10.5)
nRBC: 0 % (ref 0.0–0.2)

## 2023-01-25 LAB — COMPREHENSIVE METABOLIC PANEL
ALT: 19 U/L (ref 0–44)
AST: 17 U/L (ref 15–41)
Albumin: 3.6 g/dL (ref 3.5–5.0)
Alkaline Phosphatase: 60 U/L (ref 38–126)
Anion gap: 11 (ref 5–15)
BUN: 6 mg/dL — ABNORMAL LOW (ref 8–23)
CO2: 27 mmol/L (ref 22–32)
Calcium: 9.1 mg/dL (ref 8.9–10.3)
Chloride: 100 mmol/L (ref 98–111)
Creatinine, Ser: 0.54 mg/dL (ref 0.44–1.00)
GFR, Estimated: >60 mL/min (ref >60)
Glucose, Bld: 122 mg/dL — ABNORMAL HIGH (ref 70–99)
Potassium: 3.1 mmol/L — ABNORMAL LOW (ref 3.5–5.1)
Sodium: 138 mmol/L (ref 135–145)
Total Bilirubin: 0.8 mg/dL (ref <1.2)
Total Protein: 6.6 g/dL (ref 6.5–8.1)

## 2023-01-25 LAB — LIPASE, BLOOD: Lipase: 30 U/L (ref 11–51)

## 2023-01-25 MED ORDER — ONDANSETRON 4 MG PO TBDP
4.0000 mg | ORAL_TABLET | Freq: Once | ORAL | Status: DC
Start: 2023-01-25 — End: 2023-01-26

## 2023-01-25 MED ORDER — ACETAMINOPHEN 325 MG PO TABS
650.0000 mg | ORAL_TABLET | Freq: Once | ORAL | Status: DC
Start: 2023-01-25 — End: 2023-01-26

## 2023-01-25 NOTE — ED Triage Notes (Signed)
Pt coming from home. Pt complaining of multiple episodes of emesis that began today. Denies any blood in emesis. No episodes with EMS.

## 2023-01-25 NOTE — ED Provider Triage Note (Signed)
Emergency Medicine Provider Triage Evaluation Note  Kara Mendoza , a 72 y.o. female  was evaluated in triage.  Pt complains of N/V/D, abd pain. Fever here. NBNB emesis. Stuttering speech at baseline from prior CVA. No cough HA, st  Review of Systems  Positive: N/V/D, abd pain Negative:   Physical Exam  BP (!) 172/80   Pulse 92   Temp 100.1 F (37.8 C)   Resp 19   SpO2 97%  Gen:   Awake, no distress   Resp:  Normal effort  MSK:   Moves extremities without difficulty  Other:    Medical Decision Making  Medically screening exam initiated at 6:13 PM.  Appropriate orders placed.  Kara Mendoza was informed that the remainder of the evaluation will be completed by another provider, this initial triage assessment does not replace that evaluation, and the importance of remaining in the ED until their evaluation is complete.  N/V/D, fever   Shepard Keltz A, PA-C 01/25/23 1814

## 2023-01-25 NOTE — ED Notes (Signed)
Pt stated she did not want to stay any longer and wanted to leave. RN told me pt is she left then she would have to re-register to come back in. Pt is aware of the policy and decided to still leave.

## 2023-05-23 ENCOUNTER — Other Ambulatory Visit: Payer: Self-pay | Admitting: Nurse Practitioner

## 2023-05-23 ENCOUNTER — Ambulatory Visit
Admission: RE | Admit: 2023-05-23 | Discharge: 2023-05-23 | Disposition: A | Discharge status: Home / Self Care | Source: Ambulatory Visit | Attending: Nurse Practitioner | Admitting: Nurse Practitioner

## 2023-05-23 DIAGNOSIS — M5412 Radiculopathy, cervical region: Secondary | ICD-10-CM

## 2023-06-28 ENCOUNTER — Other Ambulatory Visit: Payer: Self-pay | Admitting: Family Medicine

## 2023-06-28 ENCOUNTER — Ambulatory Visit
Admission: RE | Admit: 2023-06-28 | Discharge: 2023-06-28 | Disposition: A | Discharge status: Home / Self Care | Source: Ambulatory Visit | Attending: Family Medicine | Admitting: Family Medicine

## 2023-06-28 DIAGNOSIS — M79651 Pain in right thigh: Secondary | ICD-10-CM

## 2023-06-28 DIAGNOSIS — M25551 Pain in right hip: Secondary | ICD-10-CM

## 2023-07-12 ENCOUNTER — Encounter (HOSPITAL_COMMUNITY): Payer: Self-pay

## 2023-07-12 ENCOUNTER — Other Ambulatory Visit: Payer: Self-pay

## 2023-07-12 ENCOUNTER — Emergency Department (HOSPITAL_COMMUNITY)
Admission: EM | Admit: 2023-07-12 | Discharge: 2023-07-13 | Discharge status: Against Medical Advice | Attending: Emergency Medicine | Admitting: Emergency Medicine

## 2023-07-12 DIAGNOSIS — M79604 Pain in right leg: Secondary | ICD-10-CM | POA: Diagnosis not present

## 2023-07-12 DIAGNOSIS — M79605 Pain in left leg: Secondary | ICD-10-CM | POA: Insufficient documentation

## 2023-07-12 DIAGNOSIS — Z5321 Procedure and treatment not carried out due to patient leaving prior to being seen by health care provider: Secondary | ICD-10-CM | POA: Diagnosis not present

## 2023-07-12 DIAGNOSIS — M7989 Other specified soft tissue disorders: Secondary | ICD-10-CM | POA: Insufficient documentation

## 2023-07-12 NOTE — ED Triage Notes (Addendum)
 Pt. Arrives via ems for bilateral leg pain and swelling x2 days. Denies SOB
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
## 2023-07-13 DIAGNOSIS — M7989 Other specified soft tissue disorders: Secondary | ICD-10-CM | POA: Diagnosis not present

## 2023-07-13 LAB — BASIC METABOLIC PANEL WITH GFR
Anion gap: 9 (ref 5–15)
BUN: 12 mg/dL (ref 8–23)
CO2: 24 mmol/L (ref 22–32)
Calcium: 10.2 mg/dL (ref 8.9–10.3)
Chloride: 103 mmol/L (ref 98–111)
Creatinine, Ser: 0.82 mg/dL (ref 0.44–1.00)
GFR, Estimated: >60 mL/min (ref >60)
Glucose, Bld: 93 mg/dL (ref 70–99)
Potassium: 3.8 mmol/L (ref 3.5–5.1)
Sodium: 136 mmol/L (ref 135–145)

## 2023-07-13 LAB — CBC
HCT: 44.7 % (ref 36.0–46.0)
Hemoglobin: 14.6 g/dL (ref 12.0–15.0)
MCH: 29.9 pg (ref 26.0–34.0)
MCHC: 32.7 g/dL (ref 30.0–36.0)
MCV: 91.4 fL (ref 80.0–100.0)
Platelets: 337 10*3/uL (ref 150–400)
RBC: 4.89 MIL/uL (ref 3.87–5.11)
RDW: 12.5 % (ref 11.5–15.5)
WBC: 8 10*3/uL (ref 4.0–10.5)
nRBC: 0 % (ref 0.0–0.2)

## 2024-03-17 ENCOUNTER — Ambulatory Visit: Admitting: Podiatry
